# Patient Record
Sex: Female | Born: 1945
Health system: Southern US, Community
[De-identification: ages and names within clinical notes are randomized; demographics above are authoritative.]

## PROBLEM LIST (undated history)

## (undated) DIAGNOSIS — F419 Anxiety disorder, unspecified: Secondary | ICD-10-CM

## (undated) DIAGNOSIS — D649 Anemia, unspecified: Secondary | ICD-10-CM

## (undated) DIAGNOSIS — J189 Pneumonia, unspecified organism: Secondary | ICD-10-CM

## (undated) DIAGNOSIS — K635 Polyp of colon: Secondary | ICD-10-CM

## (undated) DIAGNOSIS — R0602 Shortness of breath: Secondary | ICD-10-CM

## (undated) DIAGNOSIS — I10 Essential (primary) hypertension: Secondary | ICD-10-CM

## (undated) DIAGNOSIS — K219 Gastro-esophageal reflux disease without esophagitis: Secondary | ICD-10-CM

## (undated) DIAGNOSIS — J4520 Mild intermittent asthma, uncomplicated: Secondary | ICD-10-CM

## (undated) DIAGNOSIS — K2100 Gastro-esophageal reflux disease with esophagitis, without bleeding: Secondary | ICD-10-CM

## (undated) DIAGNOSIS — U071 COVID-19: Secondary | ICD-10-CM

## (undated) DIAGNOSIS — E669 Obesity, unspecified: Secondary | ICD-10-CM

## (undated) DIAGNOSIS — F329 Major depressive disorder, single episode, unspecified: Secondary | ICD-10-CM

## (undated) DIAGNOSIS — J45909 Unspecified asthma, uncomplicated: Secondary | ICD-10-CM

## (undated) DIAGNOSIS — E119 Type 2 diabetes mellitus without complications: Secondary | ICD-10-CM

## (undated) DIAGNOSIS — K589 Irritable bowel syndrome without diarrhea: Secondary | ICD-10-CM

## (undated) DIAGNOSIS — M2550 Pain in unspecified joint: Secondary | ICD-10-CM

## (undated) DIAGNOSIS — R29898 Other symptoms and signs involving the musculoskeletal system: Secondary | ICD-10-CM

## (undated) DIAGNOSIS — M549 Dorsalgia, unspecified: Secondary | ICD-10-CM

## (undated) DIAGNOSIS — H538 Other visual disturbances: Secondary | ICD-10-CM

## (undated) DIAGNOSIS — G43909 Migraine, unspecified, not intractable, without status migrainosus: Secondary | ICD-10-CM

## (undated) DIAGNOSIS — R6889 Other general symptoms and signs: Secondary | ICD-10-CM

## (undated) DIAGNOSIS — N760 Acute vaginitis: Secondary | ICD-10-CM

## (undated) DIAGNOSIS — R51 Headache: Secondary | ICD-10-CM

## (undated) DIAGNOSIS — E1169 Type 2 diabetes mellitus with other specified complication: Secondary | ICD-10-CM

## (undated) DIAGNOSIS — E7800 Pure hypercholesterolemia, unspecified: Secondary | ICD-10-CM

## (undated) DIAGNOSIS — R5383 Other fatigue: Secondary | ICD-10-CM

## (undated) DIAGNOSIS — K8020 Calculus of gallbladder without cholecystitis without obstruction: Secondary | ICD-10-CM

## (undated) DIAGNOSIS — M797 Fibromyalgia: Secondary | ICD-10-CM

## (undated) DIAGNOSIS — F32A Depression, unspecified: Secondary | ICD-10-CM

## (undated) DIAGNOSIS — R42 Dizziness and giddiness: Secondary | ICD-10-CM

## (undated) DIAGNOSIS — M1990 Unspecified osteoarthritis, unspecified site: Secondary | ICD-10-CM

## (undated) DIAGNOSIS — R140 Abdominal distension (gaseous): Secondary | ICD-10-CM

## (undated) HISTORY — PX: CATARACT EXTRACTION: SUR2

## (undated) HISTORY — PX: DENTAL SURGERY: SHX609

## (undated) HISTORY — DX: Fibromyalgia: M79.7

## (undated) HISTORY — DX: Unspecified osteoarthritis, unspecified site: M19.90

## (undated) HISTORY — DX: Gastro-esophageal reflux disease without esophagitis: K21.9

## (undated) HISTORY — DX: Abdominal distension (gaseous): R14.0

## (undated) HISTORY — DX: Dorsalgia, unspecified: M54.9

## (undated) HISTORY — DX: Other visual disturbances: H53.8

## (undated) HISTORY — DX: Obesity, unspecified: E66.9

## (undated) HISTORY — DX: Anxiety disorder, unspecified: F41.9

## (undated) HISTORY — DX: Other general symptoms and signs: R68.89

## (undated) HISTORY — PX: CHOLECYSTECTOMY: SHX55

## (undated) HISTORY — DX: Depression, unspecified: F32.A

## (undated) HISTORY — DX: Dizziness and giddiness: R42

## (undated) HISTORY — DX: Major depressive disorder, single episode, unspecified: F32.9

## (undated) HISTORY — DX: Pure hypercholesterolemia, unspecified: E78.00

## (undated) HISTORY — PX: LAPAROSCOPIC ENDOMETRIOSIS FULGURATION: SUR769

## (undated) HISTORY — DX: Acute vaginitis: N76.0

## (undated) HISTORY — DX: Shortness of breath: R06.02

## (undated) HISTORY — PX: ABDOMINAL HYSTERECTOMY: SHX81

## (undated) HISTORY — DX: Other symptoms and signs involving the musculoskeletal system: R29.898

## (undated) HISTORY — DX: Other fatigue: R53.83

## (undated) HISTORY — DX: Essential (primary) hypertension: I10

## (undated) HISTORY — DX: Pneumonia, unspecified organism: J18.9

## (undated) HISTORY — DX: Unspecified asthma, uncomplicated: J45.909

## (undated) HISTORY — DX: Polyp of colon: K63.5

## (undated) HISTORY — DX: Calculus of gallbladder without cholecystitis without obstruction: K80.20

## (undated) HISTORY — DX: Headache: R51

## (undated) HISTORY — DX: Pain in unspecified joint: M25.50

## (undated) HISTORY — DX: Anemia, unspecified: D64.9

---
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
## 1997-03-11 HISTORY — PX: APPENDECTOMY: SHX54

## 1997-10-10 ENCOUNTER — Observation Stay (HOSPITAL_COMMUNITY): Admission: RE | Admit: 1997-10-10 | Discharge: 1997-10-11 | Payer: Self-pay | Admitting: *Deleted

## 1998-03-11 HISTORY — PX: HERNIA REPAIR: SHX51

## 1998-08-02 ENCOUNTER — Other Ambulatory Visit: Admission: RE | Admit: 1998-08-02 | Discharge: 1998-08-02 | Payer: Self-pay | Admitting: Gastroenterology

## 1999-03-08 ENCOUNTER — Encounter: Payer: Self-pay | Admitting: Obstetrics and Gynecology

## 1999-03-08 ENCOUNTER — Encounter: Admission: RE | Admit: 1999-03-08 | Discharge: 1999-03-08 | Payer: Self-pay | Admitting: Obstetrics and Gynecology

## 2000-12-03 ENCOUNTER — Encounter: Admission: RE | Admit: 2000-12-03 | Discharge: 2000-12-03 | Payer: Self-pay | Admitting: Obstetrics and Gynecology

## 2000-12-03 ENCOUNTER — Encounter: Payer: Self-pay | Admitting: Obstetrics and Gynecology

## 2001-12-09 ENCOUNTER — Encounter: Admission: RE | Admit: 2001-12-09 | Discharge: 2001-12-09 | Payer: Self-pay | Admitting: Obstetrics and Gynecology

## 2001-12-09 ENCOUNTER — Encounter: Payer: Self-pay | Admitting: Obstetrics and Gynecology

## 2002-02-09 ENCOUNTER — Encounter: Payer: Self-pay | Admitting: Gastroenterology

## 2002-02-09 ENCOUNTER — Ambulatory Visit (HOSPITAL_COMMUNITY): Admission: RE | Admit: 2002-02-09 | Discharge: 2002-02-09 | Payer: Self-pay | Admitting: Gastroenterology

## 2002-07-15 ENCOUNTER — Encounter: Payer: Self-pay | Admitting: *Deleted

## 2002-07-15 ENCOUNTER — Ambulatory Visit (HOSPITAL_COMMUNITY): Admission: RE | Admit: 2002-07-15 | Discharge: 2002-07-15 | Payer: Self-pay | Admitting: *Deleted

## 2002-12-13 ENCOUNTER — Encounter: Payer: Self-pay | Admitting: Obstetrics and Gynecology

## 2002-12-13 ENCOUNTER — Encounter: Admission: RE | Admit: 2002-12-13 | Discharge: 2002-12-13 | Payer: Self-pay | Admitting: Obstetrics and Gynecology

## 2003-12-20 ENCOUNTER — Ambulatory Visit (HOSPITAL_COMMUNITY): Admission: RE | Admit: 2003-12-20 | Discharge: 2003-12-20 | Payer: Self-pay | Admitting: Obstetrics and Gynecology

## 2004-12-31 ENCOUNTER — Ambulatory Visit (HOSPITAL_COMMUNITY): Admission: RE | Admit: 2004-12-31 | Discharge: 2004-12-31 | Payer: Self-pay | Admitting: Obstetrics and Gynecology

## 2005-01-22 ENCOUNTER — Encounter: Admission: RE | Admit: 2005-01-22 | Discharge: 2005-04-08 | Payer: Self-pay | Admitting: Orthopaedic Surgery

## 2006-01-02 ENCOUNTER — Ambulatory Visit (HOSPITAL_COMMUNITY): Admission: RE | Admit: 2006-01-02 | Discharge: 2006-01-02 | Payer: Self-pay | Admitting: Obstetrics and Gynecology

## 2006-02-18 ENCOUNTER — Ambulatory Visit: Payer: Self-pay | Admitting: Gastroenterology

## 2006-03-06 ENCOUNTER — Encounter (INDEPENDENT_AMBULATORY_CARE_PROVIDER_SITE_OTHER): Payer: Self-pay | Admitting: Specialist

## 2006-03-06 ENCOUNTER — Ambulatory Visit: Payer: Self-pay | Admitting: Gastroenterology

## 2006-04-03 ENCOUNTER — Ambulatory Visit (HOSPITAL_COMMUNITY): Admission: RE | Admit: 2006-04-03 | Discharge: 2006-04-03 | Payer: Self-pay | Admitting: Gastroenterology

## 2007-02-12 ENCOUNTER — Ambulatory Visit (HOSPITAL_COMMUNITY): Admission: RE | Admit: 2007-02-12 | Discharge: 2007-02-12 | Payer: Self-pay | Admitting: Obstetrics and Gynecology

## 2007-12-27 IMAGING — MG MM DIGITAL SCREENING BILAT
4 series · 4 of 4 positions shown · non-contrast
Comparison: Prior studies.

DG SCREEN MAMMOGRAM BILATERAL
Bilateral CC and MLO view(s) were taken.

DIGITAL SCREENING MAMMOGRAM WITH CAD:

[R CC]
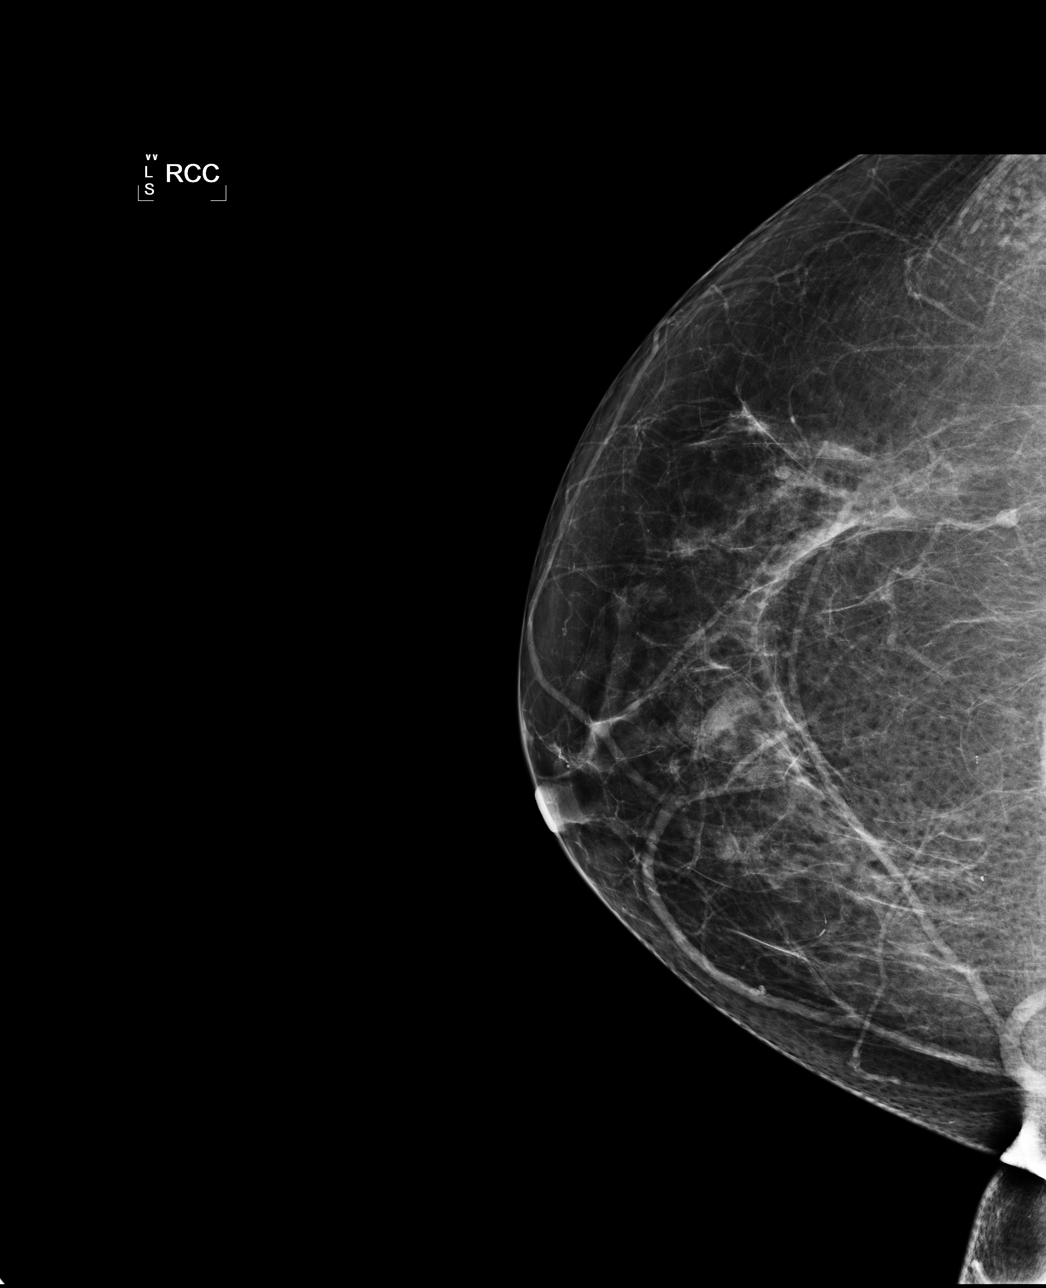

[R MLO]
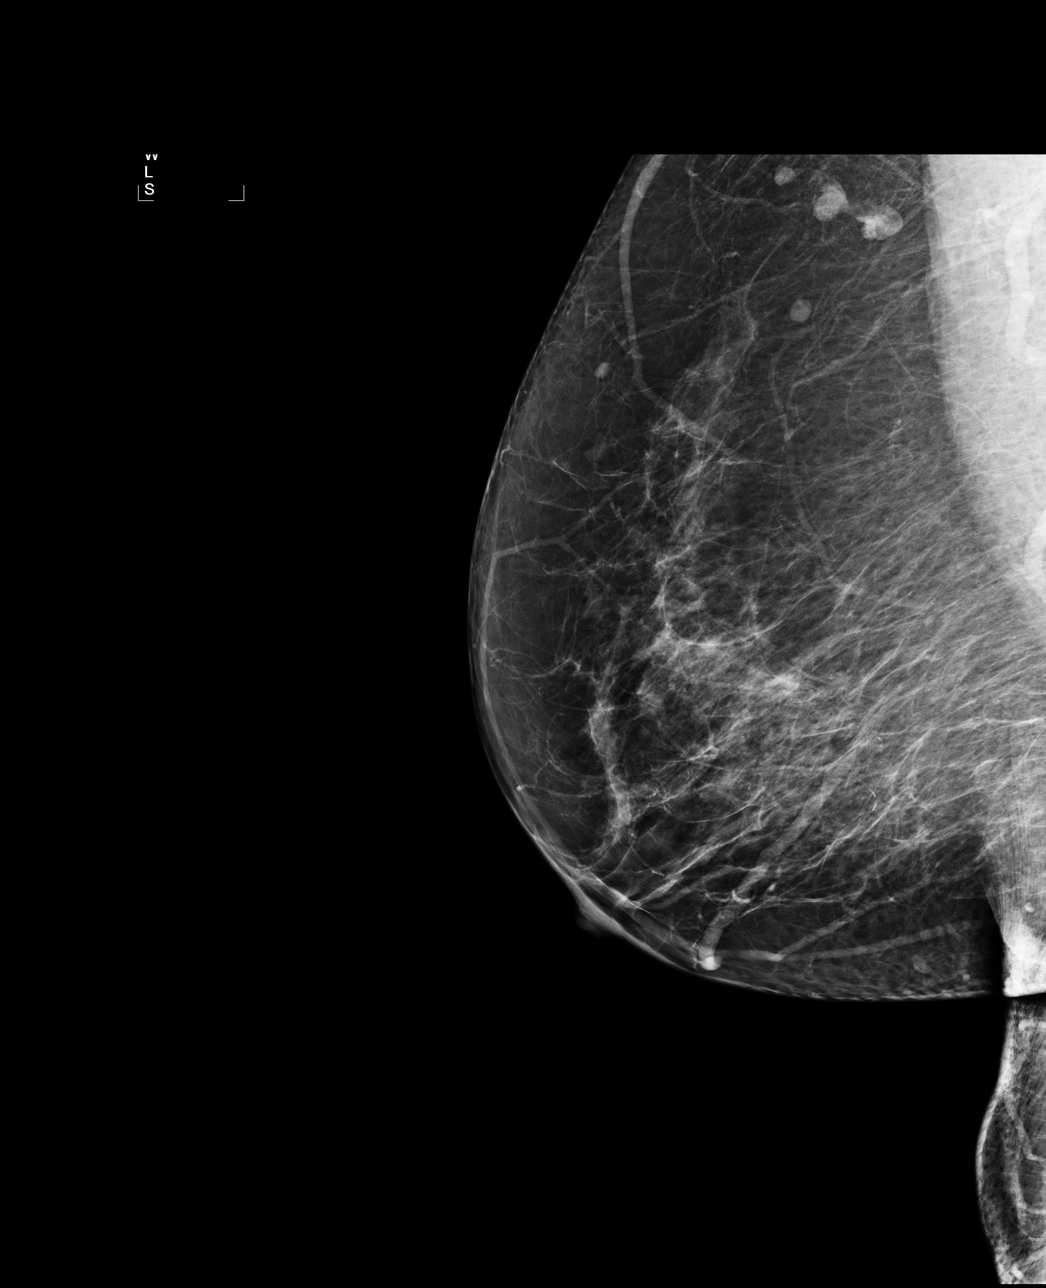

[L CC]
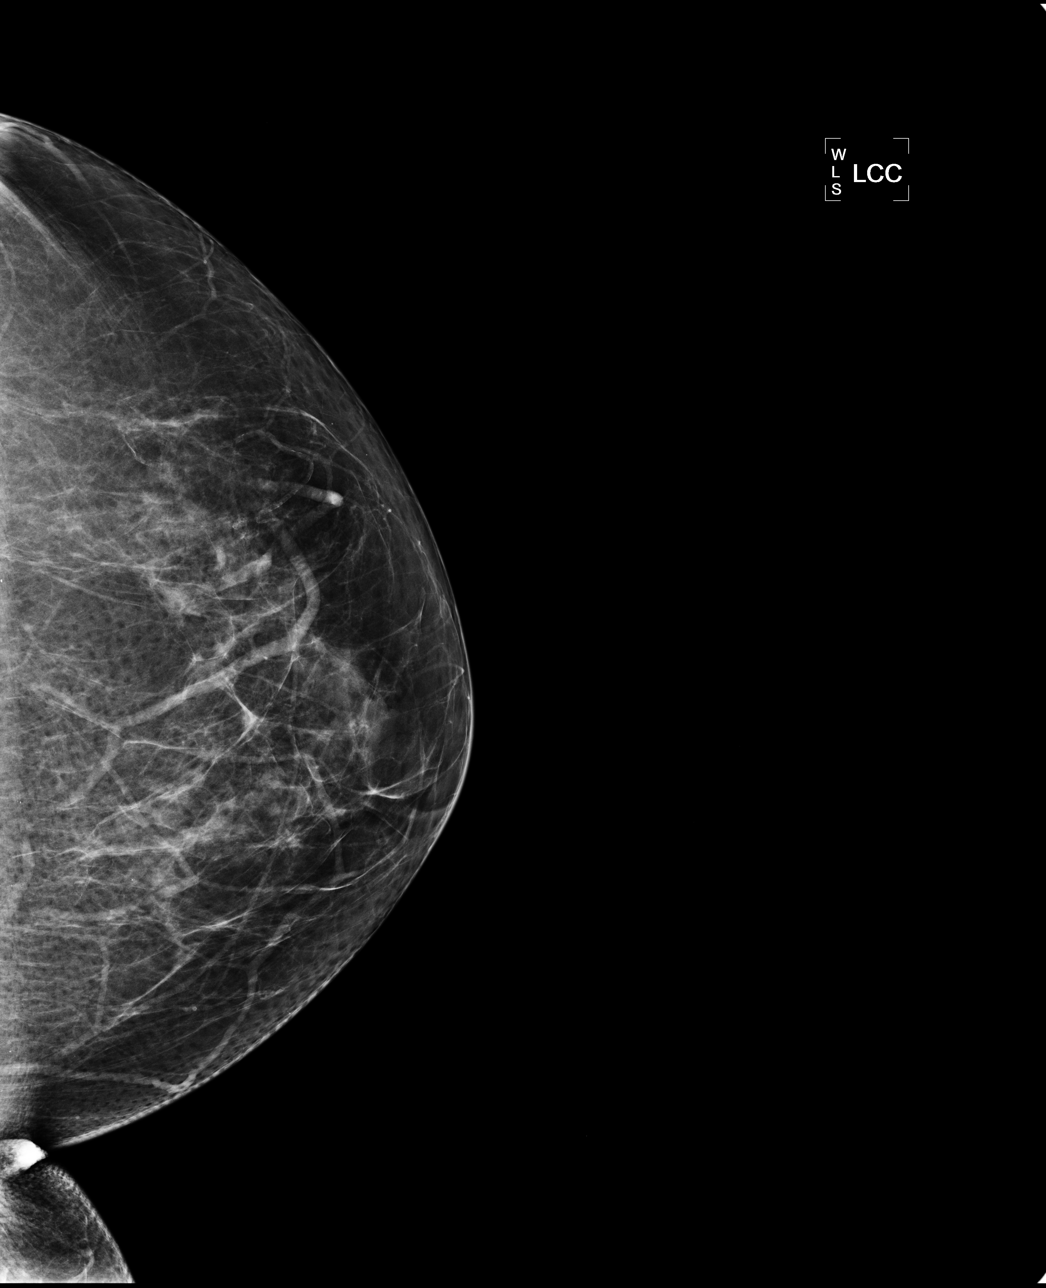

[L MLO]
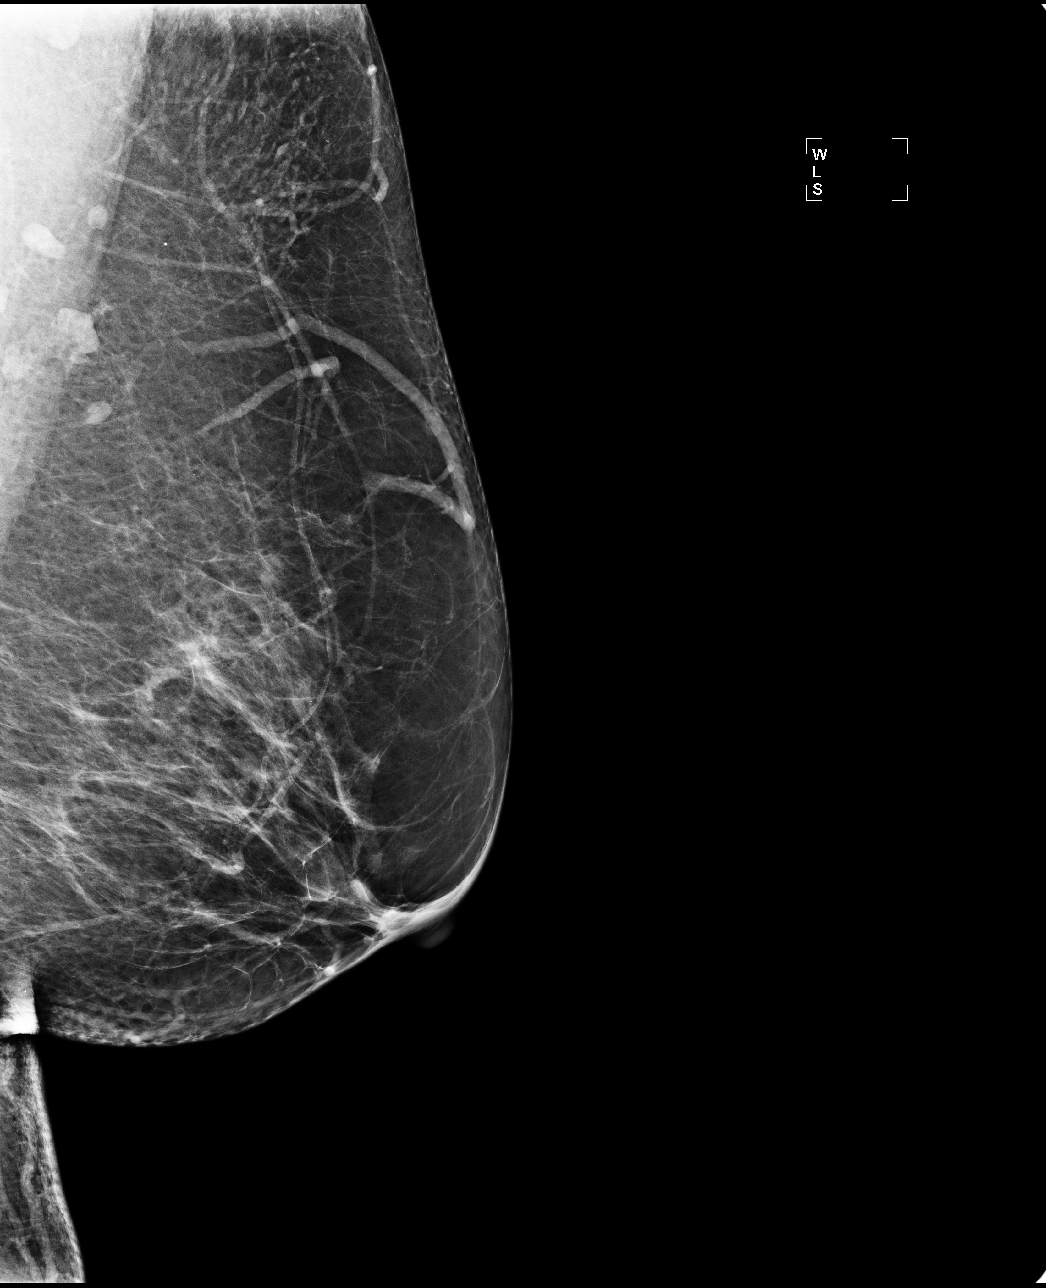

[4 of 4 positions shown; findings below may reference images not displayed]

There are scattered fibroglandular densities.  There is no dominant mass, architectural distortion 
or calcification to suggest malignancy.
IMPRESSION: No mammographic evidence of malignancy.  Suggest yearly screening mammography.

ASSESSMENT: Negative - BI-RADS 1

Screening mammogram in 1 year.
ANALYZED BY COMPUTER AIDED DETECTION. , THIS PROCEDURE WAS A DIGITAL MAMMOGRAM.

## 2009-04-18 LAB — HM PAP SMEAR: HM Pap smear: NORMAL

## 2009-07-17 ENCOUNTER — Encounter: Admission: RE | Admit: 2009-07-17 | Discharge: 2009-07-17 | Payer: Self-pay | Admitting: Orthopedic Surgery

## 2010-04-01 ENCOUNTER — Encounter: Payer: Self-pay | Admitting: Obstetrics and Gynecology

## 2010-04-01 ENCOUNTER — Encounter: Payer: Self-pay | Admitting: Family Medicine

## 2010-07-27 NOTE — Assessment & Plan Note (Signed)
[facility]                         GASTROENTEROLOGY OFFICE NOTE   Nicole, [name]                      MRN:          [id number]  DATE:02/19/2006                            DOB:          1945/05/13    REASON FOR REFERRAL:  Heartburn, nausea and Hemoccult positive stool.   HISTORY OF PRESENT ILLNESS:  [name] is a 65 year old white female  whom I have evaluated in the past. She has a history of GERD and  adenomatous colon polyps that were initially diagnosed in December of  1996. Her last colonoscopy was performed in November of 2003, which was  normal. The examination was only complete to the ascending colon due to  a tortuous colon. A subsequent barium enema was performed that showed no  evidence of polyps or mass lesions. There was marked tortuosity of the  colon particularly in the sigmoid colon and in the region of the hepatic  flexure. The cecum was felt to be mobile. There were areas of spasm in  the right colon and during the procedure, palpation of her right abdomen  led to spasms in the right colon, which was correlated with recurrent  episodes of pain. She notes her reflux symptoms are only fairly  controlled and she has frequent episodes of nausea and reflux despite  remaining on Prevacid 30 mg on a daily basis. She notes no dysphagia or  odynophagia. She does have persistent problems with intermittent cramp-  like right lower quadrant pain. She was recently found to have Hemoccult  positive stool on an exam by Dr. Romeo Apple. She is now referred for  further evaluation.   PAST MEDICAL HISTORY:  1. Asthma.  2. Hyperlipidemia.  3. Arthritis.  4. Anxiety.  5. Depression.  6. Rhinitis.  7. Headaches.  8. Gastroesophageal reflux disease.  9. Adenomatous colon polyps.  10.Status post cholecystectomy.  11.Status post inguinal herniorrhaphy.  12.Status post tubal ligation.  13.Status post hysterectomy.   CURRENT MEDICATIONS:   As listed on the chart; updated and reviewed.   MEDICATION ALLERGIES:  None known.   SOCIAL HISTORY AND REVIEW OF SYSTEMS:  Per the handwritten form.   PHYSICAL EXAMINATION:  In no acute distress. Height 5 feet 1-1/2 inches.  Weight 184 pounds. Blood pressure is 118/82, pulse 88 and regular.  HEENT: Anicteric sclerae. Oropharynx clear.  CHEST: Clear to auscultation bilaterally.  CARDIAC: Regular rate and rhythm without murmurs appreciated.  ABDOMEN: Is soft with mild right lower quadrant tenderness to deep  palpation. No rebound or guarding. No palpable organomegaly, masses or  hernias. Normoactive bowel sounds.  RECTAL: Deferred to time of colonoscopy.  EXTREMITIES: Without clubbing, cyanosis or edema.  NEUROLOGIC: Alert and oriented x3. Grossly nonfocal.   ASSESSMENT AND PLAN:  Hemoccult positive stool and fairly controlled  reflux symptoms with intermittent right lower quadrant pain. Trial of  Levsin 1 to 2 SL QID prn right lower quadrant pain. Re-intensify all  anti-reflux measures. Risks, benefits and alternatives to colonoscopy  with possible biopsy and possible polypectomy and upper endoscopy with  possible biopsy discussed with the patient. She consents to proceed. The  procedures will be scheduled electively.     Venita Lick. Russella Dar, MD, [facility]  Electronically Signed    MTS/MedQ  DD: 02/20/2006  DT: 02/20/2006  Job #: [id number]   cc:   Holley Bouche, M.D.

## 2010-09-19 ENCOUNTER — Encounter: Payer: Self-pay | Admitting: Family Medicine

## 2011-03-22 ENCOUNTER — Encounter: Payer: Self-pay | Admitting: Gastroenterology

## 2011-05-07 ENCOUNTER — Encounter: Payer: Self-pay | Admitting: Gynecology

## 2011-05-07 ENCOUNTER — Ambulatory Visit (INDEPENDENT_AMBULATORY_CARE_PROVIDER_SITE_OTHER): Payer: Medicare Other | Admitting: Gynecology

## 2011-05-07 ENCOUNTER — Other Ambulatory Visit (HOSPITAL_COMMUNITY)
Admission: RE | Admit: 2011-05-07 | Discharge: 2011-05-07 | Disposition: A | Discharge status: Home / Self Care | Payer: Medicare Other | Source: Ambulatory Visit | Attending: Gynecology | Admitting: Gynecology

## 2011-05-07 VITALS — BP 138/80 | Ht 61.75 in | Wt 196.0 lb

## 2011-05-07 DIAGNOSIS — Z01419 Encounter for gynecological examination (general) (routine) without abnormal findings: Secondary | ICD-10-CM | POA: Insufficient documentation

## 2011-05-07 DIAGNOSIS — N809 Endometriosis, unspecified: Secondary | ICD-10-CM | POA: Insufficient documentation

## 2011-05-07 DIAGNOSIS — R32 Unspecified urinary incontinence: Secondary | ICD-10-CM | POA: Insufficient documentation

## 2011-05-07 DIAGNOSIS — F329 Major depressive disorder, single episode, unspecified: Secondary | ICD-10-CM | POA: Insufficient documentation

## 2011-05-07 DIAGNOSIS — F419 Anxiety disorder, unspecified: Secondary | ICD-10-CM | POA: Insufficient documentation

## 2011-05-07 DIAGNOSIS — Z780 Asymptomatic menopausal state: Secondary | ICD-10-CM

## 2011-05-07 DIAGNOSIS — F32A Depression, unspecified: Secondary | ICD-10-CM | POA: Insufficient documentation

## 2011-05-07 DIAGNOSIS — J45909 Unspecified asthma, uncomplicated: Secondary | ICD-10-CM | POA: Insufficient documentation

## 2011-05-07 DIAGNOSIS — N952 Postmenopausal atrophic vaginitis: Secondary | ICD-10-CM

## 2011-05-07 DIAGNOSIS — Z1272 Encounter for screening for malignant neoplasm of vagina: Secondary | ICD-10-CM

## 2011-05-07 DIAGNOSIS — D649 Anemia, unspecified: Secondary | ICD-10-CM | POA: Insufficient documentation

## 2011-05-07 DIAGNOSIS — M797 Fibromyalgia: Secondary | ICD-10-CM | POA: Insufficient documentation

## 2011-05-07 DIAGNOSIS — E1169 Type 2 diabetes mellitus with other specified complication: Secondary | ICD-10-CM | POA: Insufficient documentation

## 2011-05-07 NOTE — Progress Notes (Signed)
TASHAI CATINO May 01, 1945 [id number]        66 y.o.  New patient for annual exam.  Former patient of Dr. Levy Sjogren. Several issues noted below  Past medical history,surgical history, medications, allergies, family history and social history were all reviewed and documented in the EPIC chart. ROS:  Was performed and pertinent positives and negatives are included in the history.  Exam: Amy chaperone present Filed Vitals:   05/07/11 1036  BP: 138/80   General appearance  Normal Skin grossly normal Head/Neck normal with no cervical or supraclavicular adenopathy thyroid normal Lungs  clear Cardiac RR, without RMG Abdominal  soft, nontender, without masses, organomegaly or hernia Breasts  examined lying and sitting without masses, retractions, discharge or axillary adenopathy. Pelvic  Ext/BUS/vagina  normal with mild atrophic changes.  Pap of cuff done   Adnexa  Without masses or tenderness    Anus and perineum  normal   Rectovaginal  normal sphincter tone without palpated masses or tenderness.    Assessment/Plan:  66 y.o. female for annual exam.    1. 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 Had been on oral estrogen replacement that has been discontinued and doing well without significant hot flushes night sweats.  Will continue to monitor. 2. Pap smear. Patient does have a history of atypical glandular cells on Pap smear in 96. She ultimately had a cone biopsy but I cannot find these pathology results in the records she brought with her. Her hysterectomy was after this and the pathology did not show any cervical or endocervical abnormalities. I did a Pap smear of her cuff today. 3. Mammography. Patient is overdue for her mammogram and knows to schedule this. SBE monthly reviewed. 4. Colonoscopy. Patient has one scheduled in March. 5. Bone density. It's been a number of years since she's had a bone density and I went  ahead and ordered one today. Increase calcium vitamin D was reviewed with her. 6. Urinary incontinence. Patient notes urge type urinary incontinence. I reviewed options with her to include medications such as Vesicare or Enablex. The side effect profile was also reviewed. She's already having issues with constipation she prefer not to take medication but just to monitor her symptoms. A urinalysis was ordered today. If it becomes a significant issue I may refer to urology. 7. Health maintenance. Patient does have a number of issues and she has her blood work checked through Dr. Rinaldo Cloud office. No blood work was done today. Assuming she continues well from a gynecologic standpoint she will see me in a year sooner as needed.    Dara Lords MD, 12:12 PM 05/07/2011

## 2011-05-07 NOTE — Patient Instructions (Signed)
Follow up in one year for your annual gynecologic exam. 

## 2011-05-08 LAB — URINALYSIS W MICROSCOPIC + REFLEX CULTURE
Casts: NONE SEEN
Crystals: NONE SEEN
Leukocytes, UA: NEGATIVE
Nitrite: NEGATIVE
Specific Gravity, Urine: 1.007 (ref 1.005–1.030)
pH: 7 (ref 5.0–8.0)

## 2011-05-14 ENCOUNTER — Ambulatory Visit (INDEPENDENT_AMBULATORY_CARE_PROVIDER_SITE_OTHER): Payer: Medicare Other

## 2011-05-14 DIAGNOSIS — Z780 Asymptomatic menopausal state: Secondary | ICD-10-CM

## 2011-05-14 DIAGNOSIS — Z13820 Encounter for screening for osteoporosis: Secondary | ICD-10-CM

## 2011-05-21 ENCOUNTER — Encounter: Payer: Self-pay | Admitting: *Deleted

## 2011-06-17 ENCOUNTER — Ambulatory Visit (AMBULATORY_SURGERY_CENTER): Payer: Medicare Other | Admitting: *Deleted

## 2011-06-17 ENCOUNTER — Encounter: Payer: Self-pay | Admitting: Gastroenterology

## 2011-06-17 VITALS — Ht 62.0 in | Wt 195.3 lb

## 2011-06-17 DIAGNOSIS — Z1211 Encounter for screening for malignant neoplasm of colon: Secondary | ICD-10-CM

## 2011-06-17 MED ORDER — PEG-KCL-NACL-NASULF-NA ASC-C 100 G PO SOLR: ORAL | Status: DC

## 2011-07-01 ENCOUNTER — Other Ambulatory Visit: Payer: Self-pay | Admitting: Gastroenterology

## 2011-07-31 ENCOUNTER — Ambulatory Visit (AMBULATORY_SURGERY_CENTER): Payer: Medicare Other | Admitting: Gastroenterology

## 2011-07-31 ENCOUNTER — Encounter: Payer: Self-pay | Admitting: Gastroenterology

## 2011-07-31 VITALS — Temp 98.4°F | Ht 62.0 in | Wt 195.0 lb

## 2011-07-31 DIAGNOSIS — D126 Benign neoplasm of colon, unspecified: Secondary | ICD-10-CM

## 2011-07-31 DIAGNOSIS — Z1211 Encounter for screening for malignant neoplasm of colon: Secondary | ICD-10-CM

## 2011-07-31 DIAGNOSIS — Z86010 Personal history of colonic polyps: Secondary | ICD-10-CM

## 2011-07-31 LAB — GLUCOSE, CAPILLARY: Glucose-Capillary: 111 mg/dL — ABNORMAL HIGH (ref 70–99)

## 2011-07-31 MED ORDER — SODIUM CHLORIDE 0.9 % IV SOLN: 500.0000 mL | INTRAVENOUS | Status: DC

## 2011-07-31 NOTE — Op Note (Signed)
[facility] [address] [place], [place]  [address]  COLONOSCOPY PROCEDURE REPORT PATIENT:  Nicole, [name]  MR#:  [id number] BIRTHDATE:  26-Nov-1945, 65 yrs. old  GENDER:  female ENDOSCOPIST:  Judie Petit T. Russella Dar, MD, [facility]  PROCEDURE DATE:  07/31/2011 PROCEDURE:  Colonoscopy with biopsy and snare polypectomy ASA CLASS:  Class II INDICATIONS:  1) surveillance and high-risk screening  2) history of pre-cancerous (adenomatous) colon polyps: 1996, 2007 MEDICATIONS:   MAC sedation, administered by CRNA, propofol (Diprivan) 450 mg IV DESCRIPTION OF PROCEDURE:   After the risks benefits and alternatives of the procedure were thoroughly explained, informed consent was obtained.  Digital rectal exam was performed and revealed no abnormalities.   The LB CF-[id number] endoscope was introduced through the anus and advanced to the ascending colon, limited by a tortuous and redundant colon.  The quality of the prep was good, using MoviPrep.  The instrument was then slowly withdrawn as the colon was fully examined. <<PROCEDUREIMAGES>> FINDINGS:  Two polyps were found in the ascending colon. They were 4 - 6 mm in size. With hot biopsy forceps, biopsy was obtained and sent to pathology. Due to scope looping and tortuous colon I could not approach the polyps with a snare.  Two polyps were found at the hepatic flexure. They were 4 - 10 mm in size. Polyps were snared, then cauterized with monopolar cautery. Retrieval was successful. Two polyps were found in the descending colon. They were 4 - 5 mm in size.  Otherwise normal colonoscopy without other polyps, masses, vascular ectasias, or inflammatory changes. Retroflexed views in the rectum revealed internal hemorrhoids, small.  The time to cecum =  8.25  minutes. The scope was then withdrawn (time =  13.75  min) from the patient and the procedure completed.  COMPLICATIONS:  None  ENDOSCOPIC IMPRESSION: 1) 4 - 6 mm Two polyps in the  ascending colon 2) 4 - 10 mm Two polyps at the hepatic flexure 3) 4 - 5 mm Two polyps in the descending colon 4) Internal hemorrhoids  RECOMMENDATIONS: 1) Hold aspirin, aspirin products, and anti-inflammatory medication for 2 weeks. 2) Await pathology results 3) Repeat Colonoscopy in 3 years at a tertiary center.  Venita Lick. Russella Dar, MD, [facility]  CC:  Adrian Prince, MD  n. Rosalie DoctorVenita Lick. [name] at 07/31/2011 09:14 AM  Herbie Saxon, [phone]

## 2011-07-31 NOTE — Patient Instructions (Signed)

## 2011-07-31 NOTE — Progress Notes (Signed)
Patient did not experience any of the following events: a burn prior to discharge; a fall within the facility; wrong site/side/patient/procedure/implant event; or a hospital transfer or hospital admission upon discharge from the facility. (G8907) Patient did not have preoperative order for IV antibiotic SSI prophylaxis. (G8918)  

## 2011-08-01 ENCOUNTER — Telehealth: Payer: Self-pay | Admitting: *Deleted

## 2011-08-01 ENCOUNTER — Ambulatory Visit (INDEPENDENT_AMBULATORY_CARE_PROVIDER_SITE_OTHER): Payer: Medicare Other | Admitting: Physician Assistant

## 2011-08-01 ENCOUNTER — Encounter: Payer: Self-pay | Admitting: Physician Assistant

## 2011-08-01 VITALS — BP 120/64 | HR 72 | Ht 62.0 in | Wt 191.6 lb

## 2011-08-01 DIAGNOSIS — R1031 Right lower quadrant pain: Secondary | ICD-10-CM

## 2011-08-01 DIAGNOSIS — Z9889 Other specified postprocedural states: Secondary | ICD-10-CM

## 2011-08-01 MED ORDER — CIPROFLOXACIN HCL 500 MG PO TABS: 500.0000 mg | ORAL_TABLET | Freq: Two times a day (BID) | ORAL | Status: AC

## 2011-08-01 NOTE — Telephone Encounter (Signed)
  Follow up Call-  Call back number 07/31/2011  Post procedure Call Back phone  # [phone]  Permission to leave phone message Yes     Patient questions:  Do you have a fever, pain , or abdominal swelling? yes Pain Score  5 *  Have you tolerated food without any problems? yes  Have you been able to return to your normal activities? no  Do you have any questions about your discharge instructions: Diet   no Medications  no Follow up visit  no  Do you have questions or concerns about your Care? no  Actions: * If pain score is 4 or above: Physician/ provider Notified : Claudette Head, MD. Pt. C/o  Pain to right side describe it as dull and when she moves it becomes sharp and some times burns and stings,also stated that her neck is sore,both sides and chin. Denies swelling of abdomen and the pain does not radiate any where. She is using a  Heating pad and have her feet elevated.when I explained to her that I will notify you to make you aware,I informed her that she may need to go to the hospital to be checked out she stated she did not want to go any where. Please advise.

## 2011-08-01 NOTE — Patient Instructions (Addendum)
Take Percocet as needed for pain. We sent a prescription for Cipro to [facility] [address] Rest at home.  Follow up appointment with Dr. Russella Dar is scheduled for 08-14-2011 at 2:45 PM.   Appointment card given.

## 2011-08-01 NOTE — Telephone Encounter (Signed)
I recommend that she see Mike Gip, PA in our office this morning to evaluate her. If she comes in this morning I can see her with Amy.

## 2011-08-01 NOTE — Progress Notes (Addendum)
Subjective:    Patient ID: Nicole [name], female    DOB: 03-26-1945, 66 y.o.   MRN: [id number]  HPI Nicole [name] is a 66 year old white female, known to Dr. Russella Dar who underwent colonoscopy yesterday for followup of adenomatous colon polyps. At colonoscopy she was found to have 2 polyps in the descending colon 4-6 mm in size removed with hot biopsy forceps he She was noted to have a very tortuous colon polyps were unable to be snared. She also had 2 polyps at the hepatic flexure 4-10 mm in size which were snared and then cauterized. She had 2 polyps in the descending colon as well Patient tolerated the procedure well but says about 2 after 2 hours after she returned home last evening she developed right lower quadrant pain which has been persistent since. She has Percocet at home for other uses and took some Percocet which did help. This morning she says her pain was at about the same level 5/10,she has not had a bowel movement nor any bleeding, she has been passing gas. She has not had any associated nausea or vomiting and has been eating. She's had no fever chills or sweats. She called because she has had persistent pain. She has taken Percocet today and says the pain is currently about a 3/10.   Review of Systems  Constitutional: Negative.   HENT: Negative.   Eyes: Negative.   Respiratory: Negative.   Cardiovascular: Negative.   Gastrointestinal: Positive for abdominal pain.  Genitourinary: Negative.   Musculoskeletal: Negative.   Neurological: Negative.   Hematological: Negative.   Psychiatric/Behavioral: Negative.    Outpatient Prescriptions Prior to Visit  Medication Sig Dispense Refill  . albuterol (PROVENTIL) (2.5 MG/3ML) 0.083% nebulizer solution Take 2.5 mg by nebulization every 6 (six) hours as needed.        Marland Kitchen azelastine (ASTELIN) 137 MCG/SPRAY nasal spray Place 1 spray into the nose 2 (two) times daily. Use in each nostril as directed       . ergocalciferol (VITAMIN D2) 50000 UNITS  capsule Take 50,000 Units by mouth once a week.        . fenofibrate 160 MG tablet Take 160 mg by mouth daily.      . Fluticasone-Salmeterol (ADVAIR) 250-50 MCG/DOSE AEPB Inhale 1 puff into the lungs every 12 (twelve) hours.      Marland Kitchen lamoTRIgine (LAMICTAL) 25 MG tablet Take 25 mg by mouth 2 (two) times daily.       Marland Kitchen LIDODERM 5 %       . linagliptin (TRADJENTA) 5 MG TABS tablet Take 5 mg by mouth daily.      Marland Kitchen LORazepam (ATIVAN) 1 MG tablet Take 1.5 mg by mouth every 8 (eight) hours.        . meloxicam (MOBIC) 15 MG tablet Take 15 mg by mouth daily.      Marland Kitchen oxyCODONE-acetaminophen (PERCOCET) 10-325 MG per tablet Take 1 tablet by mouth as needed.      . pantoprazole (PROTONIX) 40 MG tablet Take 40 mg by mouth daily.        . sertraline (ZOLOFT) 50 MG tablet Take 50 mg by mouth daily.       . simvastatin (ZOCOR) 40 MG tablet Take 40 mg by mouth at bedtime.        Marland Kitchen aspirin 81 MG tablet Take 81 mg by mouth daily.        . traMADol (ULTRAM) 50 MG tablet Take 50 mg by mouth every 6 (six) hours as  needed.         Allergies  Allergen Reactions  . Shellfish Allergy Shortness Of Breath    In dye for scan.   Patient Active Problem List  Diagnoses  . Anemia  . Anxiety  . Depression  . Diabetes mellitus  . Urinary incontinence  . Endometriosis  . Asthma  . Fibromyalgia   History   Social History  . Marital Status: Widowed    Spouse Name: N/A    Number of Children: N/A  . Years of Education: N/A   Occupational History  . Not on file.   Social History Main Topics  . Smoking status: Never Smoker   . Smokeless tobacco: Never Used  . Alcohol Use: Yes     occasional  . Drug Use: No  . Sexually Active: Not Currently   Other Topics Concern  . Not on file   Social History Narrative  . No narrative on file       Objective:   Physical Exam well-developed white female in no acute distress, pleasant blood pressure 120/64 pulse 72 height 5 foot 2 weight 191. HEENT; nontraumatic  normocephalic EOMI PERRLA sclera anicteric, Supple no JVD, Cardiovascular; regular rate and rhythm with S1-S2 no murmur or gallop, Pulmonary; clear bilaterally, Abdomen; a large soft, she is a large cholecystectomy scar, she is tender in the right lower quadrant there is no guarding and no rebound no palpable mass or hepatosplenomegaly bowel sounds are present, Rectal; not done, Extremities; no clubbing, cyanosis or edema skin warm dry, Psych; mood and affect normal and appropriate.        Assessment & Plan:  #3 66 year old female 24-hour status post colonoscopy and polypectomy with 6 polyps removed now presenting with right lower quadrant pain. Her exam is consistent with a post-polypectomy thermal injury. She does not have any evidence of peritoneal inflammation. #2 adenomatous colon polyps Plan; post polypectomy thermal injury discussed with the patient in detail. Patient encouraged to continue Percocet as needed over the next 24-48 hours, explained that the general course is gradual improvement over 3-4 days. Start Cipro 500 mg by mouth twice daily x5 days. Small light meals until pain improve. Patient encouraged to rest at home over the next 24 hours. She is also asked to call at any point should her symptoms worsen and that she would need a CT scan at that time. Outpatient followup with Dr. Russella Dar in 2 weeks.  Addendum: Reviewed and agree with initial management Beverley Fiedler, MD

## 2011-08-01 NOTE — Telephone Encounter (Signed)
Call placed back to pt. To inform her that see is to come in today on the third floor to see Amy Esterwood for follow up of pain at 3:00 P.M.

## 2011-08-06 ENCOUNTER — Encounter: Payer: Self-pay | Admitting: Gastroenterology

## 2011-08-14 ENCOUNTER — Encounter: Payer: Self-pay | Admitting: Gastroenterology

## 2011-08-14 ENCOUNTER — Ambulatory Visit (INDEPENDENT_AMBULATORY_CARE_PROVIDER_SITE_OTHER): Payer: Medicare Other | Admitting: Gastroenterology

## 2011-08-14 ENCOUNTER — Ambulatory Visit: Payer: Medicare Other | Admitting: Gastroenterology

## 2011-08-14 VITALS — BP 112/60 | HR 88 | Ht 62.0 in | Wt 194.2 lb

## 2011-08-14 DIAGNOSIS — K5900 Constipation, unspecified: Secondary | ICD-10-CM

## 2011-08-14 DIAGNOSIS — Z86010 Personal history of colonic polyps: Secondary | ICD-10-CM

## 2011-08-14 DIAGNOSIS — K219 Gastro-esophageal reflux disease without esophagitis: Secondary | ICD-10-CM

## 2011-08-14 MED ORDER — PANTOPRAZOLE SODIUM 40 MG PO TBEC: 40.0000 mg | DELAYED_RELEASE_TABLET | Freq: Two times a day (BID) | ORAL | Status: DC

## 2011-08-14 MED ORDER — POLYETHYLENE GLYCOL 3350 17 GM/SCOOP PO POWD: 17.0000 g | Freq: Every day | ORAL | Status: AC

## 2011-08-14 NOTE — Patient Instructions (Addendum)
Take Miralax 1-2 times a day  Take Protonix 40 mg twice a day.  Follow up with Dr. Russella Dar in 1 year or as needed.   cc: Adrian Prince, MD

## 2011-08-14 NOTE — Progress Notes (Signed)
History of Present Illness: This is a 66 year old female who recently underwent colonoscopy with polypectomy complicated by post-polypectomy syndrome with right lower abdominal pain. She was treated with a course of Cipro. Her right lower quadrant pain has resolved. She has ongoing problems with constipation and lower abdominal discomfort. She states her reflux symptoms are frequently active in the evenings.  Current Medications, Allergies, Past Medical History, Past Surgical History, Family History and Social History were reviewed in [employer] record.  Physical Exam: General: Well developed , well nourished, no acute distress Head: Normocephalic and atraumatic Eyes:  sclerae anicteric, EOMI Ears: Normal auditory acuity Mouth: No deformity or lesions Lungs: Clear throughout to auscultation Heart: Regular rate and rhythm; no murmurs, rubs or bruits Abdomen: Soft, non tender and non distended. No masses, hepatosplenomegaly or hernias noted. Normal Bowel sounds Musculoskeletal: Symmetrical with no gross deformities  Pulses:  Normal pulses noted Extremities: No clubbing, cyanosis, edema or deformities noted Neurological: Alert oriented x 4, grossly nonfocal Psychological:  Alert and cooperative. Normal mood and affect  Assessment and Recommendations:  1. Post polypectomy syndrome-resolved.   2. Constipation is the likely cause of her lower bowel discomfort. Begin MiraLax once or twice daily long-term.  3. GERD. Symptoms not adequate control. Increase pantoprazole to 40 mg twice a day. Intensify antireflux measures.  4. Personal history of adenomatous colon polyps and incomplete colonoscopies. Surveillance colonoscopy in 3 years. I recommended subsequent colonoscopies at a [facility] and she is agreeable with this plan.

## 2012-06-10 ENCOUNTER — Encounter: Payer: Self-pay | Admitting: Cardiology

## 2012-11-24 ENCOUNTER — Encounter: Payer: Self-pay | Admitting: Gynecology

## 2013-10-05 ENCOUNTER — Ambulatory Visit: Payer: Medicare Other | Admitting: Cardiology

## 2013-11-05 ENCOUNTER — Ambulatory Visit (INDEPENDENT_AMBULATORY_CARE_PROVIDER_SITE_OTHER): Payer: Medicare Other | Admitting: Cardiology

## 2013-11-05 ENCOUNTER — Encounter: Payer: Self-pay | Admitting: Cardiology

## 2013-11-05 VITALS — BP 130/80 | HR 76 | Ht 62.0 in | Wt 183.6 lb

## 2013-11-05 DIAGNOSIS — E119 Type 2 diabetes mellitus without complications: Secondary | ICD-10-CM

## 2013-11-05 DIAGNOSIS — E1169 Type 2 diabetes mellitus with other specified complication: Secondary | ICD-10-CM

## 2013-11-05 DIAGNOSIS — R0789 Other chest pain: Secondary | ICD-10-CM | POA: Insufficient documentation

## 2013-11-05 DIAGNOSIS — E669 Obesity, unspecified: Secondary | ICD-10-CM

## 2013-11-05 DIAGNOSIS — E785 Hyperlipidemia, unspecified: Secondary | ICD-10-CM | POA: Insufficient documentation

## 2013-11-05 NOTE — Patient Instructions (Signed)
We will schedule you for a nuclear stress test in one month.

## 2013-11-05 NOTE — Progress Notes (Signed)
Nicole [name] Date of Birth: 1945-11-10 Medical Record #[id number]  History of Present Illness: Nicole [name] is seen at the request of Dr. Forde Dandy for cardiac evaluation. She is a pleasant 68 yo WF with history of DM type 2, HL, obesity, and strong family history of CAD. She also has a history of fibromyalgia. She complains of left precordial chest pain sometimes radiating to back. She also has some posterior neck pain radiating to left arm. No SOB or diaphoresis. She is sedentary. She underwent a stress Myoview study in August 2010. She was able to walk 5 minutes and 38 seconds without chest pain. myoview showed mild breast attenuation but was otherwise normal.     Medication List       This list is accurate as of: 11/05/13  3:27 PM.  Always use your most recent med list.               azelastine 0.1 % nasal spray  Commonly known as:  ASTELIN  Place 1 spray into the nose 2 (two) times daily. Use in each nostril as directed     BESIVANCE 0.6 % Susp  Generic drug:  Besifloxacin HCl  Place 1 drop into the left eye 3 (three) times daily.     DUREZOL 0.05 % Emul  Generic drug:  Difluprednate  Place 1 drop into the left eye 3 (three) times daily.     ergocalciferol 50000 UNITS capsule  Commonly known as:  VITAMIN D2  Take 50,000 Units by mouth once a week.     fenofibrate 160 MG tablet  Take 160 mg by mouth daily.     Fluticasone-Salmeterol 250-50 MCG/DOSE Aepb  Commonly known as:  ADVAIR  Inhale 1 puff into the lungs every 12 (twelve) hours.     ILEVRO 0.3 % Susp  Generic drug:  Nepafenac  Place 1 drop into the right eye daily.     lamoTRIgine 25 MG tablet  Commonly known as:  LAMICTAL  Take 25 mg by mouth 2 (two) times daily.     LIDODERM 5 %  Generic drug:  lidocaine     linagliptin 5 MG Tabs tablet  Commonly known as:  TRADJENTA  Take 5 mg by mouth daily.     LORazepam 1 MG tablet  Commonly known as:  ATIVAN  Take 1.5 mg by mouth every 8 (eight) hours.     meloxicam 15 MG tablet  Commonly known as:  MOBIC  Take 15 mg by mouth daily.     oxyCODONE-acetaminophen 10-325 MG per tablet  Commonly known as:  PERCOCET  Take 1 tablet by mouth as needed.     pantoprazole 40 MG tablet  Commonly known as:  PROTONIX  Take 1 tablet (40 mg total) by mouth 2 (two) times daily.     sertraline 50 MG tablet  Commonly known as:  ZOLOFT  Take 50 mg by mouth daily.     simvastatin 40 MG tablet  Commonly known as:  ZOCOR  Take 40 mg by mouth at bedtime.         Allergies  Allergen Reactions  . Shellfish Allergy Shortness Of Breath    In dye for scan.    Past Medical History  Diagnosis Date  . Vaginitis   . Anemia   . Anxiety   . Depression   . Diabetes mellitus     prediabetic  . Urinary incontinence   . Endometriosis     history of  . Asthma   .  Fibromyalgia   . GERD (gastroesophageal reflux disease)   . Hypercholesterolemia   . Obesity   . SOB (shortness of breath)   . Chest pain   . Dizziness   . Blurred vision     occasional  . Leg weakness   . Joint pain   . Back pain   . Fatigue   . Bloating   . Forgetfulness   . Colon polyps     Past Surgical History  Procedure Laterality Date  . Abdominal hysterectomy    . Laparoscopic endometriosis fulguration    . Hernia repair  2000  . Appendectomy  1999  . Cholecystectomy    . Dental surgery      History   Social History  . Marital Status: Widowed    Spouse Name: N/A    Number of Children: 1  . Years of Education: N/A   Occupational History  . disability    Social History Main Topics  . Smoking status: Never Smoker   . Smokeless tobacco: Never Used  . Alcohol Use: Yes     Comment: occasional  . Drug Use: No  . Sexual Activity: Not Currently   Other Topics Concern  . None   Social History Narrative  . None    Family History  Problem Relation Age of Onset  . Hypertension Mother   . Heart failure Mother   . Diabetes Mother   . Hypertension Father     . Heart failure Father   . Diabetes Father   . Heart attack Father   . Stroke Father   . Cancer Brother     lung  . Thyroid disease Brother   . Cancer Paternal Aunt     bone  . Cancer Paternal Uncle     lung  . Hypertension Maternal Grandmother   . Hypertension Maternal Grandfather   . Hypertension Paternal Grandmother   . Hypertension Paternal Grandfather   . Cancer Brother     throat  . Diabetes Daughter   . Colon cancer Neg Hx   . Stomach cancer Neg Hx     Review of Systems: As noted in HPI.  All other systems were reviewed and are negative.  Physical Exam: BP 130/80  Pulse 76  Ht 5\' 2"  (1.575 m)  Wt 183 lb 9.6 oz (83.28 kg)  BMI 33.57 kg/m2 Filed Weights   11/05/13 1441  Weight: 183 lb 9.6 oz (83.28 kg)  GENERAL:  Well appearing, morbidly obese WF in NAD HEENT:  PERRL, EOMI, sclera are clear. Oropharynx is clear. NECK:  No jugular venous distention, carotid upstroke brisk and symmetric, no bruits, no thyromegaly or adenopathy LUNGS:  Clear to auscultation bilaterally CHEST:  Unremarkable HEART:  RRR,  PMI not displaced or sustained,S1 and S2 within normal limits, no S3, no S4: no clicks, no rubs, no murmurs ABD:  Soft, nontender. BS +, no masses or bruits. No hepatomegaly, no splenomegaly EXT:  2 + pulses throughout, no edema, no cyanosis no clubbing SKIN:  Warm and dry.  No rashes NEURO:  Alert and oriented x 3. Cranial nerves II through XII intact. PSYCH:  Cognitively intact    LABORATORY DATA: Ecg: NSR rate 76 bpm. Nonspecific ST-T wave abnormality.  Assessment / Plan: 1. Atypical chest pain. Patient has multiple cardiac risk factors. Normal Myoview in 2010. I would recommend updating stress Myoview at this time since she is at intermediate risk for CAD.  2. DM type 2  3. Hyperlipidemia.  4. Morbid obesity  5. Fibromyalgia.

## 2013-12-14 ENCOUNTER — Encounter: Payer: Self-pay | Admitting: Gastroenterology

## 2013-12-24 ENCOUNTER — Telehealth (HOSPITAL_COMMUNITY): Payer: Self-pay

## 2013-12-24 NOTE — Telephone Encounter (Signed)
Encounter complete. 

## 2013-12-29 ENCOUNTER — Encounter (HOSPITAL_COMMUNITY): Payer: Medicare Other

## 2014-01-10 ENCOUNTER — Encounter: Payer: Self-pay | Admitting: Cardiology

## 2014-01-13 ENCOUNTER — Telehealth (HOSPITAL_COMMUNITY): Payer: Self-pay

## 2014-01-13 NOTE — Telephone Encounter (Signed)
Encounter complete. 

## 2014-01-14 ENCOUNTER — Telehealth (HOSPITAL_COMMUNITY): Payer: Self-pay

## 2014-01-14 NOTE — Telephone Encounter (Signed)
Encounter complete. 

## 2014-01-18 ENCOUNTER — Ambulatory Visit (HOSPITAL_COMMUNITY)
Admission: RE | Admit: 2014-01-18 | Discharge: 2014-01-18 | Disposition: A | Discharge status: Home / Self Care | Payer: Medicare Other | Source: Ambulatory Visit | Attending: Cardiovascular Disease | Admitting: Cardiovascular Disease

## 2014-01-18 DIAGNOSIS — I10 Essential (primary) hypertension: Secondary | ICD-10-CM | POA: Insufficient documentation

## 2014-01-18 DIAGNOSIS — Z8249 Family history of ischemic heart disease and other diseases of the circulatory system: Secondary | ICD-10-CM | POA: Insufficient documentation

## 2014-01-18 DIAGNOSIS — E669 Obesity, unspecified: Secondary | ICD-10-CM | POA: Insufficient documentation

## 2014-01-18 DIAGNOSIS — R5383 Other fatigue: Secondary | ICD-10-CM | POA: Insufficient documentation

## 2014-01-18 DIAGNOSIS — R079 Chest pain, unspecified: Secondary | ICD-10-CM | POA: Diagnosis present

## 2014-01-18 DIAGNOSIS — R0609 Other forms of dyspnea: Secondary | ICD-10-CM | POA: Diagnosis not present

## 2014-01-18 DIAGNOSIS — R0789 Other chest pain: Secondary | ICD-10-CM

## 2014-01-18 DIAGNOSIS — E119 Type 2 diabetes mellitus without complications: Secondary | ICD-10-CM | POA: Insufficient documentation

## 2014-01-18 DIAGNOSIS — R42 Dizziness and giddiness: Secondary | ICD-10-CM | POA: Insufficient documentation

## 2014-01-18 DIAGNOSIS — E785 Hyperlipidemia, unspecified: Secondary | ICD-10-CM

## 2014-01-18 DIAGNOSIS — E1169 Type 2 diabetes mellitus with other specified complication: Secondary | ICD-10-CM

## 2014-01-18 MED ORDER — TECHNETIUM TC 99M SESTAMIBI GENERIC - CARDIOLITE
10.6000 | Freq: Once | INTRAVENOUS | Status: AC | PRN
  Administered 2014-01-18: 11 via INTRAVENOUS

## 2014-01-18 MED ORDER — TECHNETIUM TC 99M SESTAMIBI GENERIC - CARDIOLITE
31.0000 | Freq: Once | INTRAVENOUS | Status: AC | PRN
  Administered 2014-01-18: 31 via INTRAVENOUS

## 2014-01-18 NOTE — Procedures (Addendum)
[facility] [address] [place] [address] [phone]  Cardiology Nuclear Med Study  Nicole [name] is a 68 y.o. female     MRN : [id number]     DOB: 1945-11-10  Procedure Date: 01/18/2014  Nuclear Med Background Indication for Stress Test:  Evaluation for Ischemia and Abnormal EKG History:  Asthma and No cardiac history reported;Last NUC MPI on 10/31/2008-nonischemic;EF=70% Cardiac Risk Factors: Family History - CAD, Lipids, NIDDM and Obesity  Symptoms:  Chest Pain, Dizziness, DOE, Fatigue, Light-Headedness and SOB   Nuclear Pre-Procedure Caffeine/Decaff Intake:  12:00am NPO After: 10am   IV Site: R Forearm  IV 0.9% NS with Angio Cath:  22g  Chest Size (in):  n/a IV Started by: Rolene Course, RN  Height: 5\' 2"  (1.575 m)  Cup Size: A  BMI:  Body mass index is 33.46 kg/(m^2). Weight:  183 lb (83.008 kg)   Tech Comments:  n/a    Nuclear Med Study 1 or 2 day study: 1 day  Stress Test Type:  Stress  Order Authorizing Provider:  Peter Martinique, MD   Resting Radionuclide: Technetium 68m Sestamibi  Resting Radionuclide Dose: 10.6 mCi   Stress Radionuclide:  Technetium 90m Sestamibi  Stress Radionuclide Dose: 31.0 mCi           Stress Protocol Rest HR: 81 Stress HR: 155  Rest BP: 152/96 Stress BP: 152/83  Exercise Time (min): 4:21 METS: 5   Predicted Max HR: 154 bpm % Max HR: 100.65 bpm Rate Pressure Product: 33015  Dose of Adenosine (mg):  n/a Dose of Lexiscan: n/a mg  Dose of Atropine (mg): n/a Dose of Dobutamine: n/a mcg/kg/min (at max HR)  Stress Test Technologist: Leane Para, CCT Nuclear Technologist: Imagene Riches, CNMT   Rest Procedure:  Myocardial perfusion imaging was performed at rest 45 minutes following the intravenous administration of Technetium 3m Sestamibi. Stress Procedure:  The patient performed treadmill exercise using a Bruce  Protocol for 4:21 minutes. The patient stopped due to Severe  SOB and denied any chest pain.  There were no significant ST-T wave changes.  Technetium 58m Sestamibi was injected IV at peak exercise and myocardial perfusion imaging was performed after a brief delay.  Transient Ischemic Dilatation (Normal <1.22):  0.98  QGS EDV:  65 ml QGS ESV:  29 ml LV Ejection Fraction: 56%       Rest ECG: NSR - Normal EKG  Stress ECG: No significant change from baseline ECG  QPS Raw Data Images:  Normal; no motion artifact; normal heart/lung ratio. Stress Images:  Normal homogeneous uptake in all areas of the myocardium. Rest Images:  Normal homogeneous uptake in all areas of the myocardium. Subtraction (SDS):  No evidence of ischemia.  Impression Exercise Capacity:  Fair exercise capacity. BP Response:  Normal blood pressure response. Clinical Symptoms:  No significant symptoms noted. ECG Impression:  No significant ST segment change suggestive of ischemia. Comparison with Prior Nuclear Study: No significant change from previous study  Overall Impression:  Normal stress nuclear study.  LV Wall Motion:  NL LV Function; NL Wall Motion   Lorretta Harp, MD  01/18/2014 4:18 PM

## 2014-02-23 ENCOUNTER — Ambulatory Visit (INDEPENDENT_AMBULATORY_CARE_PROVIDER_SITE_OTHER): Payer: Medicare Other | Admitting: Gynecology

## 2014-02-23 ENCOUNTER — Encounter: Payer: Self-pay | Admitting: Gynecology

## 2014-02-23 VITALS — BP 120/78 | Ht 62.0 in | Wt 182.0 lb

## 2014-02-23 DIAGNOSIS — N898 Other specified noninflammatory disorders of vagina: Secondary | ICD-10-CM

## 2014-02-23 DIAGNOSIS — N952 Postmenopausal atrophic vaginitis: Secondary | ICD-10-CM

## 2014-02-23 DIAGNOSIS — N9089 Other specified noninflammatory disorders of vulva and perineum: Secondary | ICD-10-CM

## 2014-02-23 LAB — WET PREP FOR TRICH, YEAST, CLUE: Trich, Wet Prep: NONE SEEN

## 2014-02-23 MED ORDER — METRONIDAZOLE 500 MG PO TABS: 500.0000 mg | ORAL_TABLET | Freq: Two times a day (BID) | ORAL | Status: DC

## 2014-02-23 MED ORDER — FLUCONAZOLE 200 MG PO TABS: 200.0000 mg | ORAL_TABLET | Freq: Every day | ORAL | Status: DC

## 2014-02-23 NOTE — Progress Notes (Signed)
Nicole [name] 05/30/1945 [id number]        68 y.o.  G1P1 for follow up exam. Several issues noted below.  Past medical history,surgical history, problem list, medications, allergies, family history and social history were all reviewed and documented as reviewed in the EPIC chart.  ROS:  Performed with pertinent positives and negatives included in the history, assessment and plan.   Additional significant findings :  Vulvar irritation for months   Exam: [name] assistant Filed Vitals:   02/23/14 1427  BP: 120/78  Height: 5\' 2"  (1.575 m)  Weight: 182 lb (82.555 kg)   General appearance:  Normal affect, orientation and appearance. Skin: Grossly normal HEENT: Without gross lesions.  No cervical or supraclavicular adenopathy. Thyroid normal.  Lungs:  Clear without wheezing, rales or rhonchi Cardiac: RR, without RMG Abdominal:  Soft, nontender, without masses, guarding, rebound, organomegaly or hernia Breasts:  Examined lying and sitting without masses, retractions, discharge or axillary adenopathy. Pelvic:  Ext/BUS/vagina generalized atrophic changes. Right labia majora hyperkeratotic, leathery feeling with darker pigmentation. No specific lesions ulcerations or inguinal adenopathy. Left labia normal  Adnexa  Without masses or tenderness    Anus and perineum  Normal   Rectovaginal  Normal sphincter tone without palpated masses or tenderness.    Assessment/Plan:  68 y.o. G1P1 female for follow up exam.   1. Right labial hyperkeratotic skin. Been going on for months. Very itchy to the patient. No specific lesions but generalized involvement of the entire labia. Does have a white discharge where KOH wet prep does show yeast and BV. This hyperkeratotic area does not involve the other labia or other skin surrounding the vagina. Recommend biopsy for better diagnoses and patient will schedule appointment. 2. Vaginal discharge. Wet prep positive for yeast and BV. We'll treat with Diflucan 200 mg  daily 3 days. Stop Zocor while taking. Flagyl 500 mg twice a day 7 days, alcohol avoidance reviewed. Follow up if symptoms persist, worsen or recur. 3. Postmenopausal/atrophic genital changes. Status post TAH in the past. Without significant hot flashes, night sweats or vaginal dryness. Continue to monitor. 4. Mammography overdue with last mammogram 11/2012. Patient reminded to schedule when she agrees to do so. SBE monthly reviewed. 5. DEXA 2013 normal. Recommend repeat at age 37, increased calcium vitamin D reviewed. 6. Colonoscopy 2013. Repeat at their recommended interval. 7. Health maintenance. No routine blood work done as this is done at her primary physician's office. Follow up for biopsy as scheduled.     Anastasio Auerbach MD, 3:06 PM 02/23/2014

## 2014-02-23 NOTE — Patient Instructions (Signed)
Take the Diflucan pill daily for 3 days. Stop your cholesterol medicine minor taking it. Take the Flagyl medication twice daily for 7 days. Avoid alcohol while taking. Follow up for the biopsy appointment as scheduled.  You may obtain a copy of any labs that were done today by logging onto MyChart as outlined in the instructions provided with your AVS (after visit summary). The office will not call with normal lab results but certainly if there are any significant abnormalities then we will contact you.   Health Maintenance, Female A healthy lifestyle and preventative care can promote health and wellness.  Maintain regular health, dental, and eye exams.  Eat a healthy diet. Foods like vegetables, fruits, whole grains, low-fat dairy products, and lean protein foods contain the nutrients you need without too many calories. Decrease your intake of foods high in solid fats, added sugars, and salt. Get information about a proper diet from your caregiver, if necessary.  Regular physical exercise is one of the most important things you can do for your health. Most adults should get at least 150 minutes of moderate-intensity exercise (any activity that increases your heart rate and causes you to sweat) each week. In addition, most adults need muscle-strengthening exercises on 2 or more days a week.   Maintain a healthy weight. The body mass index (BMI) is a screening tool to identify possible weight problems. It provides an estimate of body fat based on height and weight. Your caregiver can help determine your BMI, and can help you achieve or maintain a healthy weight. For adults 20 years and older:  A BMI below 18.5 is considered underweight.  A BMI of 18.5 to 24.9 is normal.  A BMI of 25 to 29.9 is considered overweight.  A BMI of 30 and above is considered obese.  Maintain normal blood lipids and cholesterol by exercising and minimizing your intake of saturated fat. Eat a balanced diet with  plenty of fruits and vegetables. Blood tests for lipids and cholesterol should begin at age 20 and be repeated every 5 years. If your lipid or cholesterol levels are high, you are over 50, or you are a high risk for heart disease, you may need your cholesterol levels checked more frequently.Ongoing high lipid and cholesterol levels should be treated with medicines if diet and exercise are not effective.  If you smoke, find out from your caregiver how to quit. If you do not use tobacco, do not start.  Lung cancer screening is recommended for adults aged 83 80 years who are at high risk for developing lung cancer because of a history of smoking. Yearly low-dose computed tomography (CT) is recommended for people who have at least a 30-pack-year history of smoking and are a current smoker or have quit within the past 15 years. A pack year of smoking is smoking an average of 1 pack of cigarettes a day for 1 year (for example: 1 pack a day for 30 years or 2 packs a day for 15 years). Yearly screening should continue until the smoker has stopped smoking for at least 15 years. Yearly screening should also be stopped for people who develop a health problem that would prevent them from having lung cancer treatment.  If you are pregnant, do not drink alcohol. If you are breastfeeding, be very cautious about drinking alcohol. If you are not pregnant and choose to drink alcohol, do not exceed 1 drink per day. One drink is considered to be 12 ounces (355 mL) of  beer, 5 ounces (148 mL) of wine, or 1.5 ounces (44 mL) of liquor.  Avoid use of street drugs. Do not share needles with anyone. Ask for help if you need support or instructions about stopping the use of drugs.  High blood pressure causes heart disease and increases the risk of stroke. Blood pressure should be checked at least every 1 to 2 years. Ongoing high blood pressure should be treated with medicines, if weight loss and exercise are not effective.  If you  are 25 to 66 years old, ask your caregiver if you should take aspirin to prevent strokes.  Diabetes screening involves taking a blood sample to check your fasting blood sugar level. This should be done once every 3 years, after age 68, if you are within normal weight and without risk factors for diabetes. Testing should be considered at a younger age or be carried out more frequently if you are overweight and have at least 1 risk factor for diabetes.  Breast cancer screening is essential preventative care for women. You should practice "breast self-awareness." This means understanding the normal appearance and feel of your breasts and may include breast self-examination. Any changes detected, no matter how small, should be reported to a caregiver. Women in their 60s and 30s should have a clinical breast exam (CBE) by a caregiver as part of a regular health exam every 1 to 3 years. After age 69, women should have a CBE every year. Starting at age 64, women should consider having a mammogram (breast X-ray) every year. Women who have a family history of breast cancer should talk to their caregiver about genetic screening. Women at a high risk of breast cancer should talk to their caregiver about having an MRI and a mammogram every year.  Breast cancer gene (BRCA)-related cancer risk assessment is recommended for women who have family members with BRCA-related cancers. BRCA-related cancers include breast, ovarian, tubal, and peritoneal cancers. Having family members with these cancers may be associated with an increased risk for harmful changes (mutations) in the breast cancer genes BRCA1 and BRCA2. Results of the assessment will determine the need for genetic counseling and BRCA1 and BRCA2 testing.  The Pap test is a screening test for cervical cancer. Women should have a Pap test starting at age 30. Between ages 23 and 66, Pap tests should be repeated every 2 years. Beginning at age 75, you should have a Pap  test every 3 years as long as the past 3 Pap tests have been normal. If you had a hysterectomy for a problem that was not cancer or a condition that could lead to cancer, then you no longer need Pap tests. If you are between ages 7 and 34, and you have had normal Pap tests going back 10 years, you no longer need Pap tests. If you have had past treatment for cervical cancer or a condition that could lead to cancer, you need Pap tests and screening for cancer for at least 20 years after your treatment. If Pap tests have been discontinued, risk factors (such as a new sexual partner) need to be reassessed to determine if screening should be resumed. Some women have medical problems that increase the chance of getting cervical cancer. In these cases, your caregiver may recommend more frequent screening and Pap tests.  The human papillomavirus (HPV) test is an additional test that may be used for cervical cancer screening. The HPV test looks for the virus that can cause the cell changes on  the cervix. The cells collected during the Pap test can be tested for HPV. The HPV test could be used to screen women aged 63 years and older, and should be used in women of any age who have unclear Pap test results. After the age of 22, women should have HPV testing at the same frequency as a Pap test.  Colorectal cancer can be detected and often prevented. Most routine colorectal cancer screening begins at the age of 85 and continues through age 65. However, your caregiver may recommend screening at an earlier age if you have risk factors for colon cancer. On a yearly basis, your caregiver may provide home test kits to check for hidden blood in the stool. Use of a small camera at the end of a tube, to directly examine the colon (sigmoidoscopy or colonoscopy), can detect the earliest forms of colorectal cancer. Talk to your caregiver about this at age 66, when routine screening begins. Direct examination of the colon should be  repeated every 5 to 10 years through age 76, unless early forms of pre-cancerous polyps or small growths are found.  Hepatitis C blood testing is recommended for all people born from 1 through 1965 and any individual with known risks for hepatitis C.  Practice safe sex. Use condoms and avoid high-risk sexual practices to reduce the spread of sexually transmitted infections (STIs). Sexually active women aged 58 and younger should be checked for Chlamydia, which is a common sexually transmitted infection. Older women with new or multiple partners should also be tested for Chlamydia. Testing for other STIs is recommended if you are sexually active and at increased risk.  Osteoporosis is a disease in which the bones lose minerals and strength with aging. This can result in serious bone fractures. The risk of osteoporosis can be identified using a bone density scan. Women ages 46 and over and women at risk for fractures or osteoporosis should discuss screening with their caregivers. Ask your caregiver whether you should be taking a calcium supplement or vitamin D to reduce the rate of osteoporosis.  Menopause can be associated with physical symptoms and risks. Hormone replacement therapy is available to decrease symptoms and risks. You should talk to your caregiver about whether hormone replacement therapy is right for you.  Use sunscreen. Apply sunscreen liberally and repeatedly throughout the day. You should seek shade when your shadow is shorter than you. Protect yourself by wearing long sleeves, pants, a wide-brimmed hat, and sunglasses year round, whenever you are outdoors.  Notify your caregiver of new moles or changes in moles, especially if there is a change in shape or color. Also notify your caregiver if a mole is larger than the size of a pencil eraser.  Stay current with your immunizations. Document Released: 09/10/2010 Document Revised: 06/22/2012 Document Reviewed: 09/10/2010 [facility]  Patient Information 2014 [place].

## 2014-02-23 NOTE — Addendum Note (Signed)
Addended by: Nelva Nay on: 02/23/2014 03:21 PM   Modules accepted: Orders

## 2014-02-24 LAB — URINALYSIS W MICROSCOPIC + REFLEX CULTURE
Bacteria, UA: NONE SEEN
Bilirubin Urine: NEGATIVE
CASTS: NONE SEEN
CRYSTALS: NONE SEEN
GLUCOSE, UA: NEGATIVE mg/dL
HGB URINE DIPSTICK: NEGATIVE
Ketones, ur: NEGATIVE mg/dL
NITRITE: NEGATIVE
PH: 6 (ref 5.0–8.0)
Protein, ur: NEGATIVE mg/dL
SPECIFIC GRAVITY, URINE: 1.01 (ref 1.005–1.030)
Urobilinogen, UA: 0.2 mg/dL (ref 0.0–1.0)

## 2014-02-25 LAB — URINE CULTURE
COLONY COUNT: NO GROWTH
Organism ID, Bacteria: NO GROWTH

## 2014-03-21 ENCOUNTER — Encounter: Payer: Self-pay | Admitting: Gynecology

## 2014-03-21 ENCOUNTER — Ambulatory Visit (INDEPENDENT_AMBULATORY_CARE_PROVIDER_SITE_OTHER): Payer: Medicare Other | Admitting: Gynecology

## 2014-03-21 DIAGNOSIS — N9089 Other specified noninflammatory disorders of vulva and perineum: Secondary | ICD-10-CM

## 2014-03-21 NOTE — Progress Notes (Signed)
Nicole [name] 1945/07/03 [id number]        69 y.o.  G1P1 presents for vulvar biopsy.  Recently seen annually where she was complaining of vulvar pruritus for the last several months. She was treated for yeast vaginitis and bacterial vaginosis. Her vulvar exam showed a hyperkeratotic darker pigmented leathery feeling right labia majora and she was recommended for biopsy before initiating treatment.  Past medical history,surgical history, problem list, medications, allergies, family history and social history were all reviewed and documented in the EPIC chart.  Directed ROS with pertinent positives and negatives documented in the history of present illness/assessment and plan.  Exam: [name] assistant General appearance:  Normal External BUS vagina with generalized hyperkeratotic leathery feeling darker pigmented right labia majora. No specific lesions. No vaginal abnormalities or regional adenopathy.   The mid right labia majora was cleansed with Hibiclens, infiltrated with 1% Xylocaine and a representative skin biopsy was taken. Silver nitrate hemostasis applied afterwards.  Assessment/Plan:  69 y.o. G1P1 with right labia majora as described above. Representative biopsy taken. Patient will follow up for results and treatment plan.     Anastasio Auerbach MD, 3:38 PM 03/21/2014

## 2014-03-21 NOTE — Patient Instructions (Signed)
Office will call you with biopsy results within several days.

## 2014-03-28 ENCOUNTER — Other Ambulatory Visit: Payer: Self-pay | Admitting: Gynecology

## 2014-03-28 MED ORDER — CLOBETASOL PROP EMOLLIENT BASE 0.05 % EX CREA: TOPICAL_CREAM | CUTANEOUS | Status: DC

## 2014-04-26 ENCOUNTER — Ambulatory Visit: Payer: Medicare Other | Admitting: Gynecology

## 2014-05-10 ENCOUNTER — Ambulatory Visit (INDEPENDENT_AMBULATORY_CARE_PROVIDER_SITE_OTHER): Payer: Medicare Other | Admitting: Gynecology

## 2014-05-10 ENCOUNTER — Encounter: Payer: Self-pay | Admitting: Gynecology

## 2014-05-10 VITALS — BP 130/74

## 2014-05-10 DIAGNOSIS — N762 Acute vulvitis: Secondary | ICD-10-CM | POA: Diagnosis not present

## 2014-05-10 NOTE — Progress Notes (Signed)
Nicole [name] 1946-02-07 [id number]        68 y.o.  G1P1 Presents in follow up of her hyperkeratotic, leathery feeling darker pigmented right labia majora. 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 No atypia. Was started on Temovate 0.05% cream mid January and presents now in follow up. She notes that she is no longer having any irritation or symptoms. That the leathery feeling has resolved.  Past medical history,surgical history, problem list, medications, allergies, family history and social history were all reviewed and documented in the EPIC chart.  Directed ROS with pertinent positives and negatives documented in the history of present illness/assessment and plan.  Exam: [name] assistant General appearance:  Normal External BUS vagina with atrophic changes. Right labia majora is completely normal in appearance. There is no longer the hyperkeratotic appearance or hyperpigmentation.  Bimanual without masses or tenderness.  Assessment/Plan:  69 y.o. G1P1 with above history and exam. Prior changes have resolved using Temovate. Patient will wean off of the Temovate now. If she has recurrence of irritation she'll use short bursts of Temovate to eradicate the symptoms. Follow up if symptoms do not resolve with treatment. Otherwise follow up December 2016 when she is due for her annual exam.     Anastasio Auerbach MD, 1:56 PM 05/10/2014

## 2014-05-10 NOTE — Patient Instructions (Signed)
Wean off of the steroid cream. Use it intermittently if you develop irritation. Follow up if the irritation continues despite using the cream intermittently.

## 2014-05-24 DIAGNOSIS — F3160 Bipolar disorder, current episode mixed, unspecified: Secondary | ICD-10-CM | POA: Insufficient documentation

## 2014-06-30 ENCOUNTER — Ambulatory Visit: Payer: Self-pay | Admitting: Neurology

## 2014-08-31 ENCOUNTER — Ambulatory Visit (INDEPENDENT_AMBULATORY_CARE_PROVIDER_SITE_OTHER): Payer: Medicare Other | Admitting: Neurology

## 2014-08-31 ENCOUNTER — Encounter: Payer: Self-pay | Admitting: Neurology

## 2014-08-31 VITALS — BP 122/70 | HR 83 | Resp 16 | Ht 61.0 in | Wt 178.0 lb

## 2014-08-31 DIAGNOSIS — R51 Headache: Secondary | ICD-10-CM | POA: Diagnosis not present

## 2014-08-31 DIAGNOSIS — F419 Anxiety disorder, unspecified: Secondary | ICD-10-CM | POA: Diagnosis not present

## 2014-08-31 DIAGNOSIS — R413 Other amnesia: Secondary | ICD-10-CM | POA: Insufficient documentation

## 2014-08-31 DIAGNOSIS — R519 Headache, unspecified: Secondary | ICD-10-CM

## 2014-08-31 MED ORDER — GABAPENTIN 300 MG PO CAPS: ORAL_CAPSULE | ORAL | Status: DC

## 2014-08-31 NOTE — Progress Notes (Signed)
NEUROLOGY CONSULTATION NOTE  Nicole [name] MRN: [id number] DOB: [birth year]  Referring provider: Dr. [name] Primary care provider: Dr. [name]  Reason for consult:  Memory loss, headaches  Dear Dr Forde Dandy:  Thank you for your kind referral of Nicole [name] for consultation of the above symptoms. Although her history is well known to you, please allow me to reiterate it for the purpose of our medical record. Records and images were personally reviewed where available.  HISTORY OF PRESENT ILLNESS: This is a 69 year old right-handed woman with a history of anxiety, fibromyalgia, chronic back pain, and migraines, presenting for memory loss and chronic daily headaches. She is unsure if the memory changes are due to the headaches. She reports that headaches started in her 10s, she has "always had headaches and just lived with them," occurring once every 1-2 weeks. Over the past few years, headaches have increased in frequency now occurring daily, at times waking her up in the middle of the night. She has a constant bad around her head that she describes as "stress headaches." In addition, she has more severe migraines occurring 2-3 times a week, lasting 2-3 days, with sharp pain on the left temple, going down her left eye where she feels her left eye droops down, followed by stiffness on the left side of her neck. These would be associated with nausea, photo and phonophobia where she needs to go to bed and needs 2-3 days to recover. She denies any tearing but has noticed her left eye would get red. She also has sinus headaches, but can feel the difference from her other headaches. No clear triggers to the headaches. She had previously seen neurologist Dr. Erling Cruz and had several tests, diagnosed with "unexplained headaches." She recalls taking Triavil (combination amitriptyline and perphenazine) which "took them away totally" however this was taken off the market. She has been on Zoloft for  many years. She has occasional lightheadedness with the headaches, as well as panic attacks, and would take prn ativan. Her grandmother had severe headaches, a brother had cluster headaches. She does not recall trying any triptans. She has been taking Percocet for the headaches, and reports that she does not take more than 1 or 2 a day.  She has noticed memory changes over the past year where she would get confused. She would call her daughter and ask her what the date is. When she pays her bills, she "goes into a fog" and pushes them back "like they are not there." She knows there are bills to pay but "just does not have the energy" to do it. She denies getting lost driving or leaving the stove on. She lives alone and denies any difficulties with ADLs. She almost never forgets to take her medications, using a pillbox. She has slight word-finding difficulties. She loses her bifocals all the time. She is sleeping a lot, and would wake up 4 hours later wondering where the time went. Her grandmother had memory issues, a daughter also has memory changes that she attributes to fibromyalgia and multiple medications. She denies any vision changes, no diplopia, blurred vision, dysarthria, dysphagia, bowel/bladder dysfunction, tremors. She occasionally has anosmia, occasional neck and chronic back pain. She occasionally drinks alcohol but this makes her feel very hot. No head injuries.    PAST MEDICAL HISTORY: Past Medical History  Diagnosis Date  . Vaginitis   . Anemia   . Anxiety   . Depression   . Diabetes mellitus  prediabetic  . Urinary incontinence   . Endometriosis     history of  . Asthma   . Fibromyalgia   . GERD (gastroesophageal reflux disease)   . Hypercholesterolemia   . Obesity   . SOB (shortness of breath)   . Chest pain   . Dizziness   . Blurred vision     occasional  . Leg weakness   . Joint pain   . Back pain   . Fatigue   . Bloating   . Forgetfulness   . Colon polyps      PAST SURGICAL HISTORY: Past Surgical History  Procedure Laterality Date  . Abdominal hysterectomy    . Laparoscopic endometriosis fulguration    . Hernia repair  2000  . Appendectomy  1999  . Cholecystectomy    . Dental surgery    . Cataract extraction      both    MEDICATIONS: Current Outpatient Prescriptions on File Prior to Visit  Medication Sig Dispense Refill  . Clobetasol Prop Emollient Base 0.05 % emollient cream Apply nightly to affected area. 30 g 0  . ergocalciferol (VITAMIN D2) 50000 UNITS capsule Take 50,000 Units by mouth once a week.      . fenofibrate 160 MG tablet Take 160 mg by mouth daily.    Marland Kitchen LORazepam (ATIVAN) 1 MG tablet 3 (three) times daily as needed.     . meloxicam (MOBIC) 15 MG tablet Take 15 mg by mouth daily.    Marland Kitchen oxyCODONE-acetaminophen (PERCOCET) 10-325 MG per tablet Take 1 tablet by mouth 3 (three) times daily as needed.     . pantoprazole (PROTONIX) 40 MG tablet Take 1 tablet (40 mg total) by mouth 2 (two) times daily. 60 tablet 6  . sertraline (ZOLOFT) 50 MG tablet Take 50 mg by mouth daily. Takes with a 25 mg tablet    . simvastatin (ZOCOR) 40 MG tablet Take 40 mg by mouth at bedtime.       No current facility-administered medications on file prior to visit.    ALLERGIES: Allergies  Allergen Reactions  . Fish-Derived Products Anaphylaxis  . Shellfish Allergy Shortness Of Breath    In dye for scan.  . Aspirin Nausea Only    FAMILY HISTORY: Family History  Problem Relation Age of Onset  . Hypertension Mother   . Heart failure Mother   . Diabetes Mother   . Hypertension Father   . Heart failure Father   . Diabetes Father   . Heart attack Father   . Stroke Father   . Cancer Brother     lung  . Thyroid disease Brother   . Cancer Paternal Aunt     bone  . Cancer Paternal Uncle     lung  . Hypertension Maternal Grandmother   . Hypertension Maternal Grandfather   . Hypertension Paternal Grandmother   . Hypertension Paternal  Grandfather   . Cancer Brother     throat  . Diabetes Daughter   . Colon cancer Neg Hx   . Stomach cancer Neg Hx     SOCIAL HISTORY: History   Social History  . Marital Status: Widowed    Spouse Name: N/A  . Number of Children: 1  . Years of Education: N/A   Occupational History  . disability    Social History Main Topics  . Smoking status: Never Smoker   . Smokeless tobacco: Never Used  . Alcohol Use: 0.0 oz/week    0 Standard drinks or equivalent per week  Comment: occasional  . Drug Use: No  . Sexual Activity: Not Currently   Other Topics Concern  . Not on file   Social History Narrative    REVIEW OF SYSTEMS: Constitutional: No fevers, chills, or sweats, no generalized fatigue, change in appetite Eyes: No visual changes, double vision, eye pain Ear, nose and throat: No hearing loss, ear pain, nasal congestion, sore throat Cardiovascular: No chest pain, palpitations Respiratory:  No shortness of breath at rest or with exertion, wheezes GastrointestinaI: No nausea, vomiting, diarrhea, abdominal pain, fecal incontinence Genitourinary:  No dysuria, urinary retention or frequency Musculoskeletal:  + neck pain, back pain Integumentary: No rash, pruritus, skin lesions Neurological: as above Psychiatric: No depression, insomnia, +anxiety Endocrine: No palpitations, fatigue, diaphoresis, mood swings, change in appetite, change in weight, increased thirst Hematologic/Lymphatic:  No anemia, purpura, petechiae. Allergic/Immunologic: no itchy/runny eyes, nasal congestion, recent allergic reactions, rashes  PHYSICAL EXAM: Filed Vitals:   08/31/14 1351  BP: 122/70  Pulse: 83  Resp: 16   General: No acute distress Head:  Normocephalic/atraumatic Eyes: Fundoscopic exam shows bilateral sharp discs, no vessel changes, exudates, or hemorrhages Neck: supple, no paraspinal tenderness, full range of motion Back: No paraspinal tenderness Heart: regular rate and  rhythm Lungs: Clear to auscultation bilaterally. Vascular: No carotid bruits. Skin/Extremities: No rash, no edema Neurological Exam: Mental status: alert and oriented to person, place, and time, no dysarthria or aphasia, Fund of knowledge is appropriate.  Recent and remote memory are intact.  Attention and concentration are normal.    Able to name objects and repeat phrases.  MMSE - Mini Mental State Exam 08/31/2014  Orientation to time 5  Orientation to Place 5  Registration 3  Attention/ Calculation 5  Recall 2  Language- name 2 objects 2  Language- repeat 1  Language- follow 3 step command 3  Language- read & follow direction 1  Write a sentence 1  Copy design 1  Total score 29   Cranial nerves: CN I: not tested CN II: pupils equal, round and reactive to light, visual fields intact, fundi unremarkable. CN III, IV, VI:  full range of motion, no nystagmus, no ptosis CN V: facial sensation intact CN VII: upper and lower face symmetric CN VIII: hearing intact to finger rub CN IX, X: gag intact, uvula midline CN XI: sternocleidomastoid and trapezius muscles intact CN XII: tongue midline Bulk & Tone: normal, no fasciculations. Motor: 5/5 throughout with no pronator drift. Sensation: reports slight decreased cold on left UE. Otherwise intact to light touch, pin, vibration and joint position sense.  No extinction to double simultaneous stimulation.  Romberg test negative Deep Tendon Reflexes: +2 throughout, no ankle clonus Plantar responses: downgoing bilaterally Cerebellar: no incoordination on finger to nose, heel to shin. No dysdiadochokinesia Gait: narrow-based and steady, able to tandem walk adequately. Tremor: none  IMPRESSION: This is a 69 year old right-handed woman with a history of anxiety, fibromyalgia, chronic back pain, migraines, presenting for worsening memory and chronic daily headaches. Her neurological exam is unremarkable except for subjective decreased sensation  on left arm. MMSE normal 29/30. We discussed different causes of memory loss. Records from PCP will be requested for recent TSH and B12. Head CT without contrast will be ordered to assess for underlying structural abnormality. We also discussed pseudodementia and effects of depression/anxiety and chronic headaches on memory. No indication to start cholinesterase inhibitors at this time. We discussed treatment of anxiety/depression, she is agreeable to seeing Behavioral Health. For the daily headaches, there  is likely a component of medication overuse, she was advised to minimize any rescue medication, particularly Percocet, to 2-3 a week. She will start gabapentin for headache prophylaxis, 300mg  qhs x 2 weeks, then increase to 1 cap BID. Side effects were discussed. We discussed the importance of physical exercise and brain stimulation exercises for brain health. She will keep a calendar of her headaches and follow-up in 3 months.   Thank you for allowing me to participate in the care of this patient. Please do not hesitate to call for any questions or concerns.   Ellouise Newer, M.D.  CC: Dr. Forde Dandy

## 2014-08-31 NOTE — Patient Instructions (Signed)
1. Start Gabapentin 300mg : Take 1 capsule at bedtime for 2 weeks, then increase to 1 capsule twice a day. Monitor for drowsiness 2. Schedule head CT without contrast 3. Refer to Behavioral Medicine for anxiety 4. Minimize intake of Percocet or any other rescue medication (Tylenol, Advil) to 2-3 a week to avoid rebound headaches 5. Physical exercise and brain stimulation exercises are important for brain health 6. Follow-up in 3 months, call for any problems

## 2014-09-08 ENCOUNTER — Other Ambulatory Visit: Payer: Medicare Other

## 2014-09-13 ENCOUNTER — Other Ambulatory Visit: Payer: Medicare Other

## 2014-09-13 ENCOUNTER — Telehealth: Payer: Self-pay | Admitting: Neurology

## 2014-09-13 NOTE — Telephone Encounter (Signed)
Lmom to return my call. 

## 2014-09-13 NOTE — Telephone Encounter (Signed)
Pt no showed ct scan today Nicole [name] [phone] [facility] imaging called

## 2014-09-14 NOTE — Telephone Encounter (Signed)
I called patient again and was able to speak with her. She states she forgot to call [facility] to reschedule her appt, she has been in [place] at the hospital with her grandson. She is going to call [facility] to reschedule her appt.

## 2014-09-20 ENCOUNTER — Ambulatory Visit
Admission: RE | Admit: 2014-09-20 | Discharge: 2014-09-20 | Disposition: A | Discharge status: Home / Self Care | Payer: Medicare Other | Source: Ambulatory Visit | Attending: Neurology | Admitting: Neurology

## 2014-09-20 ENCOUNTER — Telehealth: Payer: Self-pay | Admitting: Family Medicine

## 2014-09-20 NOTE — Telephone Encounter (Signed)
-----   Message from Cameron Sprang, MD sent at 09/20/2014  3:40 PM EDT ----- Pls let her know head CT looks good, no evidence of tumor, stroke, or bleed. Thanks

## 2014-09-20 NOTE — Telephone Encounter (Signed)
Patient was notified of results.  

## 2014-12-06 ENCOUNTER — Ambulatory Visit (INDEPENDENT_AMBULATORY_CARE_PROVIDER_SITE_OTHER): Payer: Medicare Other | Admitting: Neurology

## 2014-12-06 ENCOUNTER — Encounter: Payer: Self-pay | Admitting: Neurology

## 2014-12-06 VITALS — BP 120/80 | HR 80 | Resp 16 | Ht 61.0 in | Wt 176.0 lb

## 2014-12-06 DIAGNOSIS — R51 Headache: Secondary | ICD-10-CM | POA: Diagnosis not present

## 2014-12-06 DIAGNOSIS — R413 Other amnesia: Secondary | ICD-10-CM | POA: Diagnosis not present

## 2014-12-06 DIAGNOSIS — R519 Headache, unspecified: Secondary | ICD-10-CM

## 2014-12-06 MED ORDER — GABAPENTIN 300 MG PO CAPS: ORAL_CAPSULE | ORAL | Status: DC

## 2014-12-06 NOTE — Progress Notes (Signed)
NEUROLOGY FOLLOW UP OFFICE NOTE  Nicole [name] [id number]  HISTORY OF PRESENT ILLNESS: I had the pleasure of seeing Nicole [name] in follow-up in the neurology clinic on 12/06/2014.  The patient was last seen 3 months ago for memory loss and worsening migraines. Records and images were personally reviewed where available.  I personally reviewed head CT without contrast which was unremarkable. She reports that after starting gabapentin, the headaches were gone the next day and she felt well for a month, however afterwards the headaches came back, more on the right temporal region. She denies any dizziness but feels unsteady, like her walking is not straight. She denies any falls, no focal numbness/tingling/weakness, but occasionally her legs would be throbbing at night and wake her up. She feels her memory is unchanged, she denies getting lost or missing medications. She had some problems with bills, she let her bank do it and "now is a great big mess." No difficulties with ADLs. She denies any vision changes, no diplopia, blurred vision, dysarthria, dysphagia, bowel/bladder dysfunction, tremors.  HPI: This is a 69 yo RH woman with a history of anxiety, fibromyalgia, chronic back pain, and migraines, who presented for memory loss and chronic daily headaches. She is unsure if the memory changes are due to the headaches. She reports that headaches started in her 77s, she has "always had headaches and just lived with them," occurring once every 1-2 weeks. Over the past few years, headaches have increased in frequency now occurring daily, at times waking her up in the middle of the night. She has a constant bad around her head that she describes as "stress headaches." In addition, she has more severe migraines occurring 2-3 times a week, lasting 2-3 days, with sharp pain on the left temple, going down her left eye where she feels her left eye droops down, followed by stiffness on the left side of her neck.  These would be associated with nausea, photo and phonophobia where she needs to go to bed and needs 2-3 days to recover. She denies any tearing but has noticed her left eye would get red. She also has sinus headaches, but can feel the difference from her other headaches. No clear triggers to the headaches. She had previously seen neurologist Dr. Erling Cruz and had several tests, diagnosed with "unexplained headaches." She recalls taking Triavil (combination amitriptyline and perphenazine) which "took them away totally" however this was taken off the market. She has been on Zoloft for many years. She has occasional lightheadedness with the headaches, as well as panic attacks, and would take prn ativan. Her grandmother had severe headaches, a brother had cluster headaches. She does not recall trying any triptans. She has been taking Percocet for the headaches, and reports that she does not take more than 1 or 2 a day.  She has noticed memory changes over the past year where she would get confused. She would call her daughter and ask her what the date is. When she pays her bills, she "goes into a fog" and pushes them back "like they are not there." She knows there are bills to pay but "just does not have the energy" to do it. She denies getting lost driving or leaving the stove on. She lives alone and denies any difficulties with ADLs. She almost never forgets to take her medications, using a pillbox. She has slight word-finding difficulties. She loses her bifocals all the time. She is sleeping a lot, and would wake up 4 hours later  wondering where the time went. Her grandmother had memory issues, a daughter also has memory changes that she attributes to fibromyalgia and multiple medications.  She occasionally has anosmia, occasional neck and chronic back pain. She occasionally drinks alcohol but this makes her feel very hot. No head injuries.   PAST MEDICAL HISTORY: Past Medical History  Diagnosis Date  . Vaginitis     . Anemia   . Anxiety   . Depression   . Diabetes mellitus     prediabetic  . Urinary incontinence   . Endometriosis     history of  . Asthma   . Fibromyalgia   . GERD (gastroesophageal reflux disease)   . Hypercholesterolemia   . Obesity   . SOB (shortness of breath)   . Chest pain   . Dizziness   . Blurred vision     occasional  . Leg weakness   . Joint pain   . Back pain   . Fatigue   . Bloating   . Forgetfulness   . Colon polyps     MEDICATIONS: Current Outpatient Prescriptions on File Prior to Visit  Medication Sig Dispense Refill  . Clobetasol Prop Emollient Base 0.05 % emollient cream Apply nightly to affected area. 30 g 0  . ergocalciferol (VITAMIN D2) 50000 UNITS capsule Take 50,000 Units by mouth once a week.      . fenofibrate 160 MG tablet Take 160 mg by mouth daily.    . Fluticasone-Salmeterol (ADVAIR DISKUS) 250-50 MCG/DOSE AEPB Inhale 1 puff into the lungs 2 (two) times daily.    Marland Kitchen gabapentin (NEURONTIN) 300 MG capsule Take 1 capsule at bedtime for 2 weeks, then increase to 1 capsule twice a day (Patient taking differently: Take 300 mg by mouth 2 (two) times daily. ) 60 capsule 4  . LORazepam (ATIVAN) 1 MG tablet 1 mg 2 (two) times daily.     . meloxicam (MOBIC) 15 MG tablet Take 15 mg by mouth daily.    Marland Kitchen oxyCODONE-acetaminophen (PERCOCET) 10-325 MG per tablet Take 1 tablet by mouth 3 (three) times daily as needed.     . pantoprazole (PROTONIX) 40 MG tablet Take 1 tablet (40 mg total) by mouth 2 (two) times daily. 60 tablet 6  . sertraline (ZOLOFT) 25 MG tablet Take 25 mg by mouth daily. Takes with a 50 mg tablet    . sertraline (ZOLOFT) 50 MG tablet Take 50 mg by mouth daily. Takes with a 25 mg tablet    . simvastatin (ZOCOR) 40 MG tablet Take 40 mg by mouth at bedtime.      . vitamin B-12 (CYANOCOBALAMIN) 1000 MCG tablet Take 1,000 mcg by mouth daily.    Marland Kitchen aspirin 81 MG tablet Take 81 mg by mouth daily.     No current facility-administered medications  on file prior to visit.    ALLERGIES: Allergies  Allergen Reactions  . Fish-Derived Products Anaphylaxis  . Shellfish Allergy Shortness Of Breath    In dye for scan.  . Aspirin Nausea Only    FAMILY HISTORY: Family History  Problem Relation Age of Onset  . Hypertension Mother   . Heart failure Mother   . Diabetes Mother   . Hypertension Father   . Heart failure Father   . Diabetes Father   . Heart attack Father   . Stroke Father   . Cancer Brother     lung  . Thyroid disease Brother   . Cancer Paternal Aunt     bone  .  Cancer Paternal Uncle     lung  . Hypertension Maternal Grandmother   . Hypertension Maternal Grandfather   . Hypertension Paternal Grandmother   . Hypertension Paternal Grandfather   . Cancer Brother     throat  . Diabetes Daughter   . Colon cancer Neg Hx   . Stomach cancer Neg Hx     SOCIAL HISTORY: Social History   Social History  . Marital Status: Widowed    Spouse Name: N/A  . Number of Children: 1  . Years of Education: N/A   Occupational History  . disability    Social History Main Topics  . Smoking status: Never Smoker   . Smokeless tobacco: Never Used  . Alcohol Use: 0.0 oz/week    0 Standard drinks or equivalent per week     Comment: occasional  . Drug Use: No  . Sexual Activity: Not Currently   Other Topics Concern  . Not on file   Social History Narrative    REVIEW OF SYSTEMS: Constitutional: No fevers, chills, or sweats, no generalized fatigue, change in appetite Eyes: No visual changes, double vision, eye pain Ear, nose and throat: No hearing loss, ear pain, nasal congestion, sore throat Cardiovascular: No chest pain, palpitations Respiratory:  No shortness of breath at rest or with exertion, wheezes GastrointestinaI: No nausea, vomiting, diarrhea, abdominal pain, fecal incontinence Genitourinary:  No dysuria, urinary retention or frequency Musculoskeletal:  No neck pain, back pain Integumentary: No rash,  pruritus, skin lesions Neurological: as above Psychiatric: No depression, insomnia, anxiety Endocrine: No palpitations, fatigue, diaphoresis, mood swings, change in appetite, change in weight, increased thirst Hematologic/Lymphatic:  No anemia, purpura, petechiae. Allergic/Immunologic: no itchy/runny eyes, nasal congestion, recent allergic reactions, rashes  PHYSICAL EXAM: Filed Vitals:   12/06/14 1441  BP: 120/80  Pulse: 80  Resp: 16   General: No acute distress Head:  Normocephalic/atraumatic Neck: supple, no paraspinal tenderness, full range of motion Heart:  Regular rate and rhythm Lungs:  Clear to auscultation bilaterally Back: No paraspinal tenderness Skin/Extremities: No rash, no edema Neurological Exam: alert and oriented to person, place, and time. No aphasia or dysarthria. Fund of knowledge is appropriate.  Recent and remote memory are intact. 3/3 delayed recall.  Attention and concentration are normal.    Able to name objects and repeat phrases. Cranial nerves: Pupils equal, round, reactive to light.  Fundoscopic exam unremarkable, no papilledema. Extraocular movements intact with no nystagmus. Visual fields full. Facial sensation intact. No facial asymmetry. Tongue, uvula, palate midline.  Motor: Bulk and tone normal, muscle strength 5/5 throughout with no pronator drift.  Sensation to light touch intact.  No extinction to double simultaneous stimulation.  Deep tendon reflexes 2+ throughout, toes downgoing.  Finger to nose testing intact.  Gait narrow-based and steady, able to tandem walk adequately.  Romberg negative.  IMPRESSION: This is a 69 yo RH woman with a history of anxiety, fibromyalgia, chronic back pain, migraines, who presented for worsening memory and chronic daily headaches. Head CT unremarkable. Her MMSE in June 2016 was normal 29/30. She had initial good response to low dose gabapentin, but now reports daily headaches are back. She will increase dose to 600mg  BID  over the next week. Side effects were discussed. We again discussed effects of anxiety/mood on memory, as well as the importance of physical exercise and brain stimulation exercises for brain health. She will keep a calendar of her headaches and follow-up in 3 months.   Thank you for allowing me to participate  in her care.  Please do not hesitate to call for any questions or concerns.  The duration of this appointment visit was 24 minutes of face-to-face time with the patient.  Greater than 50% of this time was spent in counseling, explanation of diagnosis, planning of further management, and coordination of care.   Ellouise Newer, M.D.   CC: Dr. Forde Dandy

## 2014-12-06 NOTE — Patient Instructions (Signed)
1. Increase Gabapentin 300mg : Take 1 cap in AM, 2 caps in PM for 1 week, then increase to 2 caps twice a day 2. Keep a calendar of your headaches 3. Physical exercise and brain stimulation exercises (crossword puzzles, word search, etc) are important for brain health 4. Follow-up in 3 months, call for any problems

## 2014-12-07 DIAGNOSIS — R51 Headache: Principal | ICD-10-CM

## 2014-12-07 DIAGNOSIS — R519 Headache, unspecified: Secondary | ICD-10-CM | POA: Insufficient documentation

## 2015-03-14 ENCOUNTER — Ambulatory Visit (INDEPENDENT_AMBULATORY_CARE_PROVIDER_SITE_OTHER): Payer: Medicare Other | Admitting: Neurology

## 2015-03-14 ENCOUNTER — Encounter: Payer: Self-pay | Admitting: Neurology

## 2015-03-14 VITALS — BP 140/76 | HR 80 | Wt 185.4 lb

## 2015-03-14 DIAGNOSIS — R519 Headache, unspecified: Secondary | ICD-10-CM

## 2015-03-14 DIAGNOSIS — R51 Headache: Secondary | ICD-10-CM | POA: Diagnosis not present

## 2015-03-14 DIAGNOSIS — F419 Anxiety disorder, unspecified: Secondary | ICD-10-CM | POA: Diagnosis not present

## 2015-03-14 MED ORDER — PREGABALIN 100 MG PO CAPS: ORAL_CAPSULE | ORAL | Status: DC

## 2015-03-14 NOTE — Patient Instructions (Signed)
1. Reduce Gabapentin 300mg : Take 1 capsule twice a day for 4 days, then stop 2. Start Lyrica 50mg  sample pack: Take 1 capsule twice a day for 4 days, then increase to 1 cap in AM, 2 caps in PM for 4 days, then increase to 2 caps twice a day. Once done with samples, your prescription in the pharmacy will be for Lyrica 100mg : Take 1 cap twice a day. 3. Discuss switching Zoloft to Cymbalta with your PCP 4. Recommend seeing psychiatry for the anxiety and depression 5. Follow-up in 3 months

## 2015-03-14 NOTE — Progress Notes (Signed)
NEUROLOGY FOLLOW UP OFFICE NOTE  Nicole [name] [id number]  HISTORY OF PRESENT ILLNESS: I had the pleasure of seeing Nicole [name] in follow-up in the neurology clinic on 03/14/2015.  The patient was last seen 3 months ago for memory loss and worsening migraines. On her last visit, gabapentin dose was increased to 600mg  BID after she reported an initial good response. She reports that she has not seen any difference, she continues to have daily headaches lasting throughout the day. She has occasional dizziness. She also reports body aches in her shoulders and upper arms. She takes Percocet on a daily basis. She lives by herself and denies getting lost driving, but feels she is having some panic attacks, on the way here she felt hot then cold and restless, wanting to take off her jacket. She has been on Zoloft for at least 20 years, and states she is "addicted" to Ativan for anxiety. She denies any falls but has slipped a couple of times, no injuries. No difficulties with ADLs. She denies any vision changes, no diplopia, blurred vision, dysarthria, dysphagia, bowel/bladder dysfunction, tremors.  HPI: [name] with a history of anxiety, fibromyalgia, chronic back pain, and migraines, who presented for memory loss and chronic daily headaches. She is unsure if the memory changes are due to the headaches. She reports that headaches started in her 21s, she has "always had headaches and just lived with them," occurring once every 1-2 weeks. Over the past few years, headaches have increased in frequency now occurring daily, at times waking her up in the middle of the night. She has a constant bad around her head that she describes as "stress headaches." In addition, she has more severe migraines occurring 2-3 times a week, lasting 2-3 days, with sharp pain on the left temple, going down her left eye where she feels her left eye droops down, followed by stiffness on the left side of her neck.  These would be associated with nausea, photo and phonophobia where she needs to go to bed and needs 2-3 days to recover. She denies any tearing but has noticed her left eye would get red. She also has sinus headaches, but can feel the difference from her other headaches. No clear triggers to the headaches. She had previously seen neurologist Dr. Erling Cruz and had several tests, diagnosed with "unexplained headaches." She recalls taking Triavil (combination amitriptyline and perphenazine) which "took them away totally" however this was taken off the market. She has been on Zoloft for many years. She has occasional lightheadedness with the headaches, as well as panic attacks, and would take prn ativan. Her grandmother had severe headaches, a brother had cluster headaches. She does not recall trying any triptans. She has been taking Percocet for the headaches, and reports that she does not take more than 1 or 2 a day.  She has noticed memory changes over the past year where she would get confused. She would call her daughter and ask her what the date is. When she pays her bills, she "goes into a fog" and pushes them back "like they are not there." She knows there are bills to pay but "just does not have the energy" to do it. She denies getting lost driving or leaving the stove on. She lives alone and denies any difficulties with ADLs. She almost never forgets to take her medications, using a pillbox. She has slight word-finding difficulties. She loses her bifocals all the time. She is sleeping  a lot, and would wake up 4 hours later wondering where the time went. Her grandmother had memory issues, a daughter also has memory changes that she attributes to fibromyalgia and multiple medications.  She occasionally has anosmia, occasional neck and chronic back pain. She occasionally drinks alcohol but this makes her feel very hot. No head injuries.   Diagnostic Data: Head CT without contrast normal.  PAST MEDICAL  HISTORY: Past Medical History  Diagnosis Date  . Vaginitis   . Anemia   . Anxiety   . Depression   . Diabetes mellitus     prediabetic  . Urinary incontinence   . Endometriosis     history of  . Asthma   . Fibromyalgia   . GERD (gastroesophageal reflux disease)   . Hypercholesterolemia   . Obesity   . SOB (shortness of breath)   . Chest pain   . Dizziness   . Blurred vision     occasional  . Leg weakness   . Joint pain   . Back pain   . Fatigue   . Bloating   . Forgetfulness   . Colon polyps     MEDICATIONS: Current Outpatient Prescriptions on File Prior to Visit  Medication Sig Dispense Refill  . aspirin 81 MG tablet Take 81 mg by mouth daily.    . Clobetasol Prop Emollient Base 0.05 % emollient cream Apply nightly to affected area. 30 g 0  . ergocalciferol (VITAMIN D2) 50000 UNITS capsule Take 50,000 Units by mouth once a week.      . fenofibrate 160 MG tablet Take 160 mg by mouth daily.    . Fluticasone-Salmeterol (ADVAIR DISKUS) 250-50 MCG/DOSE AEPB Inhale 1 puff into the lungs 2 (two) times daily.    Marland Kitchen gabapentin (NEURONTIN) 300 MG capsule Take 2 caps twice a day 120 capsule 4  . LORazepam (ATIVAN) 1 MG tablet 1 mg 2 (two) times daily.     . meloxicam (MOBIC) 15 MG tablet Take 15 mg by mouth daily.    Marland Kitchen oxyCODONE-acetaminophen (PERCOCET) 10-325 MG per tablet Take 1 tablet by mouth 3 (three) times daily as needed.     . pantoprazole (PROTONIX) 40 MG tablet Take 1 tablet (40 mg total) by mouth 2 (two) times daily. 60 tablet 6  . sertraline (ZOLOFT) 25 MG tablet Take 25 mg by mouth daily. Takes with a 50 mg tablet    . sertraline (ZOLOFT) 50 MG tablet Take 50 mg by mouth daily. Takes with a 25 mg tablet    . simvastatin (ZOCOR) 40 MG tablet Take 40 mg by mouth at bedtime.      . vitamin B-12 (CYANOCOBALAMIN) 1000 MCG tablet Take 1,000 mcg by mouth daily.     No current facility-administered medications on file prior to visit.    ALLERGIES: Allergies  Allergen  Reactions  . Fish-Derived Products Anaphylaxis  . Shellfish Allergy Shortness Of Breath    In dye for scan.  . Aspirin Nausea Only    FAMILY HISTORY: Family History  Problem Relation Age of Onset  . Hypertension Mother   . Heart failure Mother   . Diabetes Mother   . Hypertension Father   . Heart failure Father   . Diabetes Father   . Heart attack Father   . Stroke Father   . Cancer Brother     lung  . Thyroid disease Brother   . Cancer Paternal Aunt     bone  . Cancer Paternal Uncle  lung  . Hypertension Maternal Grandmother   . Hypertension Maternal Grandfather   . Hypertension Paternal Grandmother   . Hypertension Paternal Grandfather   . Cancer Brother     throat  . Diabetes Daughter   . Colon cancer Neg Hx   . Stomach cancer Neg Hx     SOCIAL HISTORY: Social History   Social History  . Marital Status: Widowed    Spouse Name: N/A  . Number of Children: 1  . Years of Education: N/A   Occupational History  . disability    Social History Main Topics  . Smoking status: Never Smoker   . Smokeless tobacco: Never Used  . Alcohol Use: 0.0 oz/week    0 Standard drinks or equivalent per week     Comment: occasional  . Drug Use: No  . Sexual Activity: Not Currently   Other Topics Concern  . Not on file   Social History Narrative    REVIEW OF SYSTEMS: Constitutional: No fevers, chills, or sweats, no generalized fatigue, change in appetite Eyes: No visual changes, double vision, eye pain Ear, nose and throat: No hearing loss, ear pain, nasal congestion, sore throat Cardiovascular: No chest pain, palpitations Respiratory:  No shortness of breath at rest or with exertion, wheezes GastrointestinaI: No nausea, vomiting, diarrhea, abdominal pain, fecal incontinence Genitourinary:  No dysuria, urinary retention or frequency Musculoskeletal:  + neck pain, back pain Integumentary: No rash, pruritus, skin lesions Neurological: as above Psychiatric: No  depression, insomnia, anxiety Endocrine: No palpitations, fatigue, diaphoresis, mood swings, change in appetite, change in weight, increased thirst Hematologic/Lymphatic:  No anemia, purpura, petechiae. Allergic/Immunologic: no itchy/runny eyes, nasal congestion, recent allergic reactions, rashes  PHYSICAL EXAM: Filed Vitals:   03/14/15 1526  BP: 140/76  Pulse: 80   General: No acute distress Head:  Normocephalic/atraumatic Neck: supple, no paraspinal tenderness, full range of motion Heart:  Regular rate and rhythm Lungs:  Clear to auscultation bilaterally Back: No paraspinal tenderness Skin/Extremities: No rash, no edema Neurological Exam: alert and oriented to person, place, and time. No aphasia or dysarthria. Fund of knowledge is appropriate.  Recent and remote memory are intact. 3/3 delayed recall.  Attention and concentration are normal.    Able to name objects and repeat phrases. Cranial nerves: Pupils equal, round, reactive to light.  Fundoscopic exam unremarkable, no papilledema. Extraocular movements intact with no nystagmus. Visual fields full. Facial sensation intact. No facial asymmetry. Tongue, uvula, palate midline.  Motor: Bulk and tone normal, muscle strength 5/5 throughout with no pronator drift.  Sensation to light touch intact.  No extinction to double simultaneous stimulation.  Deep tendon reflexes 2+ throughout, toes downgoing.  Finger to nose testing intact.  Gait narrow-based and steady, able to tandem walk adequately.  Romberg negative.  IMPRESSION: [name] with a history of anxiety, fibromyalgia, chronic back pain, migraines, who presented for worsening memory and chronic daily headaches. Head CT unremarkable. Her MMSE in June 2016 was normal 29/30. She had initial good response to gabapentin, but today continues to report daily headaches, as well as body aches. She will try switching from gabapentin to Lyrica to see if this helps with headaches and  fibromyalgia. She was given a tapering schedule for gabapentin and uptitration schedule for Lyrica. Side effects were discussed. There is likely a component of medication overuse headaches as well with daily Percocet intake, she was instructed to minimize intake to 2-3 a week to avoid rebound headaches. She reports panic attacks  and will discuss this with her PCP, including consideration for switching to Cymbalta in the future to also help with body pains. She is interested in seeing a psychiatrist for the anxiety and depression. She will follow-up in 3 months.   Thank you for allowing me to participate in her care.  Please do not hesitate to call for any questions or concerns.  The duration of this appointment visit was 25 minutes of face-to-face time with the patient.  Greater than 50% of this time was spent in counseling, explanation of diagnosis, planning of further management, and coordination of care.   Ellouise Newer, M.D.   CC: Dr. Forde Dandy

## 2015-05-01 ENCOUNTER — Encounter: Payer: Medicare Other | Admitting: Gynecology

## 2015-05-02 ENCOUNTER — Telehealth: Payer: Self-pay | Admitting: Neurology

## 2015-05-02 NOTE — Telephone Encounter (Signed)
Pt needs a letter to get out of jury duty please call [phone]

## 2015-05-02 NOTE — Telephone Encounter (Signed)
Please review

## 2015-05-02 NOTE — Telephone Encounter (Signed)
Pls ask if there is a form that I fill out, thanks

## 2015-05-03 NOTE — Telephone Encounter (Signed)
Lmovm to rtn my call. 

## 2015-05-04 NOTE — Telephone Encounter (Signed)
PT wanted a letter from Dr Delice Lesch to get her out of jury duty/[place] [phone]

## 2015-05-04 NOTE — Telephone Encounter (Signed)
No form. Needs letter stating that she can't serve from medical stand point and why. Jury date of 05/25/15.

## 2015-05-05 ENCOUNTER — Encounter: Payer: Self-pay | Admitting: Neurology

## 2015-05-05 NOTE — Telephone Encounter (Signed)
Please call patient on the cell phone number [phone]

## 2015-05-05 NOTE — Telephone Encounter (Signed)
LMOM letting patient know letter written and at our front desk for pick up.

## 2015-05-23 ENCOUNTER — Telehealth: Payer: Self-pay | Admitting: *Deleted

## 2015-05-23 ENCOUNTER — Encounter: Payer: Self-pay | Admitting: Gynecology

## 2015-05-23 ENCOUNTER — Ambulatory Visit (INDEPENDENT_AMBULATORY_CARE_PROVIDER_SITE_OTHER): Payer: Medicare Other | Admitting: Gynecology

## 2015-05-23 VITALS — BP 136/80 | Ht 62.0 in | Wt 180.0 lb

## 2015-05-23 DIAGNOSIS — N952 Postmenopausal atrophic vaginitis: Secondary | ICD-10-CM | POA: Diagnosis not present

## 2015-05-23 DIAGNOSIS — Z01419 Encounter for gynecological examination (general) (routine) without abnormal findings: Secondary | ICD-10-CM

## 2015-05-23 MED ORDER — NONFORMULARY OR COMPOUNDED ITEM: Status: DC

## 2015-05-23 NOTE — Patient Instructions (Signed)
Start on the vaginal estrogen cream from [facility] twice weekly. Call me if you have any issues with this.

## 2015-05-23 NOTE — Telephone Encounter (Signed)
-----   Message from Anastasio Auerbach, MD sent at 05/23/2015 11:55 AM EDT ----- [facility] vaginal estradiol cream prefilled syringes twice weekly refill 1 year

## 2015-05-23 NOTE — Progress Notes (Signed)
    ELERI TINER December 04, 1945 [id number]        70 y.o.  G1P1  for breast and pelvic exam. Several issues noted below.  Past medical history,surgical history, problem list, medications, allergies, family history and social history were all reviewed and documented as reviewed in the EPIC chart.  ROS:  Performed with pertinent positives and negatives included in the history, assessment and plan.   Additional significant findings :  none   Exam: Caryn Bee assistant Filed Vitals:   05/23/15 1126  BP: 136/80  Height: 5\' 2"  (1.575 m)  Weight: 180 lb (81.647 kg)   General appearance:  Normal affect, orientation and appearance. Skin: Grossly normal HEENT: Without gross lesions.  No cervical or supraclavicular adenopathy. Thyroid normal.  Lungs:  Clear without wheezing, rales or rhonchi Cardiac: RR, without RMG Abdominal:  Soft, nontender, without masses, guarding, rebound, organomegaly or hernia Breasts:  Examined lying and sitting without masses, retractions, discharge or axillary adenopathy. Pelvic:  Ext/BUS/vagina with atrophic changes  Adnexa without masses or tenderness    Anus and perineum normal   Rectovaginal normal sphincter tone without palpated masses or tenderness.    Assessment/Plan:  70 y.o. G1P1 female for breast and pelvic exam.   1. Postmenopausal/atrophic genital changes. Patient notes vaginal dryness on a daily basis.  Also had attempted intercourse several times with significant discomfort. Exam does show generalized atrophic changes. Prior vulvitis gone. Discussed options and ultimately we both decided for a trial of vaginal estrogen cream [facility]. Risks to include absorption with systemic effects such as thrombosis with stroke heart attack DVT or breast cancer reviewed.  Patient will follow up if continues to be an issue after starting the estrogen. 2. Mammography coming due this spring and she will call and schedule this. SBE  monthly reviewed. 3. Colonoscopy 2013. Repeat at their recommended interval. 4. DEXA 2013 normal. Plan repeat next year at five-year interval.  Increased calcium and vitamin D. 5. Pap smear 04/2011. No Pap smear done today. No history of significant abnormal Pap smears. We discussed current screening guidelines and based on age and hysterectomy history we both agree to stop screening. 6. Health maintenance. No routine lab work done as patient does this at her primary physician's office. Follow up 1 year, sooner as needed    Anastasio Auerbach MD, 11:51 AM 05/23/2015

## 2015-05-23 NOTE — Telephone Encounter (Signed)
Rx faxed

## 2015-06-08 ENCOUNTER — Ambulatory Visit (INDEPENDENT_AMBULATORY_CARE_PROVIDER_SITE_OTHER): Payer: Medicare Other | Admitting: Psychology

## 2015-06-08 DIAGNOSIS — F3130 Bipolar disorder, current episode depressed, mild or moderate severity, unspecified: Secondary | ICD-10-CM

## 2015-06-12 ENCOUNTER — Encounter (HOSPITAL_COMMUNITY): Payer: Self-pay | Admitting: Psychology
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
## 2015-06-20 ENCOUNTER — Ambulatory Visit (INDEPENDENT_AMBULATORY_CARE_PROVIDER_SITE_OTHER): Payer: Medicare Other | Admitting: Neurology

## 2015-06-20 ENCOUNTER — Encounter: Payer: Self-pay | Admitting: Neurology

## 2015-06-20 VITALS — BP 122/90 | HR 74 | Resp 16 | Wt 183.0 lb

## 2015-06-20 DIAGNOSIS — M797 Fibromyalgia: Secondary | ICD-10-CM | POA: Diagnosis not present

## 2015-06-20 DIAGNOSIS — R51 Headache: Secondary | ICD-10-CM | POA: Diagnosis not present

## 2015-06-20 DIAGNOSIS — R413 Other amnesia: Secondary | ICD-10-CM | POA: Diagnosis not present

## 2015-06-20 DIAGNOSIS — R519 Headache, unspecified: Secondary | ICD-10-CM

## 2015-06-20 MED ORDER — PREGABALIN 100 MG PO CAPS: ORAL_CAPSULE | ORAL | Status: DC

## 2015-06-20 NOTE — Patient Instructions (Signed)
1. Increase Lyrica 100mg : Take 1 capsule in AM, 2 capsules in PM until you finish your current bottle, then your new prescription will be for Lyrica 100mg : take 2 capsules twice a day 2. Minimize intake of Percocet for headaches to 2-3 times a week 3. Follow-up in 5 months, call for any changes

## 2015-06-20 NOTE — Progress Notes (Signed)
NEUROLOGY FOLLOW UP OFFICE NOTE  SHAYLIE ESSENMACHER [id number]  HISTORY OF PRESENT ILLNESS: I had the pleasure of seeing Nicole [name] in follow-up in the neurology clinic on 06/20/2015.  The patient was last seen 3 months ago for memory loss and worsening migraines. On her last visit, she reported continued daily headaches on gabapentin, as well as body aches. She was switched to Lyrica. She was also noted to take Percocet on a daily basis and was instructed to minimize intake to avoid rebound headaches. She is currently on Lyrica 100mg  BID with no side effects. She continues to have headaches on a daily basis, stating she may be a little better, but is not doing so well due to diffuse body aches in her lower back and legs, shoulders, "aching all over." She was started on Cymbalta 30mg  daily with no side effects. She continues to take Percocet on a daily basis, stating she had been taking it more frequently this month for body aches and headaches. She reports that she is still forgetful, overall she denies getting lost driving except she got lost coming here today and went to the 4th floor instead of the 3rd flood. She denies any missed medications. She denies any falls. No difficulties with ADLs. She denies any vision changes, no diplopia, blurred vision, dysarthria, dysphagia, bowel/bladder dysfunction, tremors.  HPI: [name] with a history of anxiety, fibromyalgia, chronic back pain, and migraines, who presented for memory loss and chronic daily headaches. She is unsure if the memory changes are due to the headaches. She reports that headaches started in her 51s, she has "always had headaches and just lived with them," occurring once every 1-2 weeks. Over the past few years, headaches have increased in frequency now occurring daily, at times waking her up in the middle of the night. She has a constant bad around her head that she describes as "stress headaches." In addition, she has more  severe migraines occurring 2-3 times a week, lasting 2-3 days, with sharp pain on the left temple, going down her left eye where she feels her left eye droops down, followed by stiffness on the left side of her neck. These would be associated with nausea, photo and phonophobia where she needs to go to bed and needs 2-3 days to recover. She denies any tearing but has noticed her left eye would get red. She also has sinus headaches, but can feel the difference from her other headaches. No clear triggers to the headaches. She had previously seen neurologist Dr. Erling Cruz and had several tests, diagnosed with "unexplained headaches." She recalls taking Triavil (combination amitriptyline and perphenazine) which "took them away totally" however this was taken off the market. She has been on Zoloft for many years. She has occasional lightheadedness with the headaches, as well as panic attacks, and would take prn ativan. Her grandmother had severe headaches, a brother had cluster headaches. She does not recall trying any triptans. She has been taking Percocet for the headaches, and reports that she does not take more than 1 or 2 a day.  She has noticed memory changes over the past year where she would get confused. She would call her daughter and ask her what the date is. When she pays her bills, she "goes into a fog" and pushes them back "like they are not there." She knows there are bills to pay but "just does not have the energy" to do it. She denies getting lost driving  or leaving the stove on. She lives alone and denies any difficulties with ADLs. She almost never forgets to take her medications, using a pillbox. She has slight word-finding difficulties. She loses her bifocals all the time. She is sleeping a lot, and would wake up 4 hours later wondering where the time went. Her grandmother had memory issues, a daughter also has memory changes that she attributes to fibromyalgia and multiple medications.  She occasionally  has anosmia, occasional neck and chronic back pain. She occasionally drinks alcohol but this makes her feel very hot. No head injuries.   Diagnostic Data: Head CT without contrast normal.  PAST MEDICAL HISTORY: Past Medical History  Diagnosis Date  . Vaginitis   . Anemia   . Anxiety   . Depression   . Diabetes mellitus     prediabetic  . Urinary incontinence   . Endometriosis     history of  . Asthma   . Fibromyalgia   . GERD (gastroesophageal reflux disease)   . Hypercholesterolemia   . Obesity   . SOB (shortness of breath)   . Chest pain   . Dizziness   . Blurred vision     occasional  . Leg weakness   . Joint pain   . Back pain   . Fatigue   . Bloating   . Forgetfulness   . Colon polyps   . Headache     migraines    MEDICATIONS: Current Outpatient Prescriptions on File Prior to Visit  Medication Sig Dispense Refill  . aspirin 81 MG tablet Take 81 mg by mouth daily.    . Clobetasol Prop Emollient Base 0.05 % emollient cream Apply nightly to affected area. 30 g 0  . DULoxetine (CYMBALTA) 30 MG capsule Take 30 mg by mouth daily.    . ergocalciferol (VITAMIN D2) 50000 UNITS capsule Take 50,000 Units by mouth once a week.      . estradiol (ESTRACE) 0.1 MG/GM vaginal cream Place 1 Applicatorful vaginally at bedtime.    . fenofibrate 160 MG tablet Take 160 mg by mouth daily.    . Fluticasone-Salmeterol (ADVAIR DISKUS) 250-50 MCG/DOSE AEPB Inhale 1 puff into the lungs 2 (two) times daily.    Marland Kitchen LORazepam (ATIVAN) 1 MG tablet 1 mg 2 (two) times daily.     . meloxicam (MOBIC) 15 MG tablet Take 15 mg by mouth daily.    . NONFORMULARY OR COMPOUNDED ITEM Estradiol 0.02% vaginal cream prefilled applicators apply one twice weekly 90 each 3  . oxyCODONE-acetaminophen (PERCOCET) 10-325 MG per tablet Take 1 tablet by mouth 3 (three) times daily as needed.     . pantoprazole (PROTONIX) 40 MG tablet Take 1 tablet (40 mg total) by mouth 2 (two) times daily. 60 tablet 6  .  polyethylene glycol powder (GLYCOLAX/MIRALAX) powder Take by mouth.    . pregabalin (LYRICA) 100 MG capsule Take 1 capsule twice a day 60 capsule 4  . simvastatin (ZOCOR) 40 MG tablet Take 40 mg by mouth at bedtime.       No current facility-administered medications on file prior to visit.    ALLERGIES: Allergies  Allergen Reactions  . Fish-Derived Products Anaphylaxis  . Shellfish Allergy Shortness Of Breath    In dye for scan.  . Aspirin Nausea Only    FAMILY HISTORY: Family History  Problem Relation Age of Onset  . Hypertension Mother   . Heart failure Mother   . Diabetes Mother   . Hypertension Father   . Heart  failure Father   . Diabetes Father   . Heart attack Father   . Stroke Father   . Cancer Brother     lung  . Thyroid disease Brother   . Cancer Paternal Aunt     bone  . Cancer Paternal Uncle     lung  . Hypertension Maternal Grandmother   . Hypertension Maternal Grandfather   . Hypertension Paternal Grandmother   . Hypertension Paternal Grandfather   . Cancer Brother     throat  . Diabetes Daughter   . Colon cancer Neg Hx   . Stomach cancer Neg Hx     SOCIAL HISTORY: Social History   Social History  . Marital Status: Widowed    Spouse Name: N/A  . Number of Children: 1  . Years of Education: N/A   Occupational History  . disability    Social History Main Topics  . Smoking status: Never Smoker   . Smokeless tobacco: Never Used  . Alcohol Use: 0.0 oz/week    0 Standard drinks or equivalent per week     Comment: occasional  . Drug Use: No  . Sexual Activity: Not Currently     Comment: 1st intercourse 70 yo-Fewer than 5 partners   Other Topics Concern  . Not on file   Social History Narrative    REVIEW OF SYSTEMS: Constitutional: No fevers, chills, or sweats, no generalized fatigue, change in appetite Eyes: No visual changes, double vision, eye pain Ear, nose and throat: No hearing loss, ear pain, nasal congestion, sore  throat Cardiovascular: No chest pain, palpitations Respiratory:  No shortness of breath at rest or with exertion, wheezes GastrointestinaI: No nausea, vomiting, diarrhea, abdominal pain, fecal incontinence Genitourinary:  No dysuria, urinary retention or frequency Musculoskeletal:  + neck pain, back pain Integumentary: No rash, pruritus, skin lesions Neurological: as above Psychiatric: No depression, insomnia, anxiety Endocrine: No palpitations, fatigue, diaphoresis, mood swings, change in appetite, change in weight, increased thirst Hematologic/Lymphatic:  No anemia, purpura, petechiae. Allergic/Immunologic: no itchy/runny eyes, nasal congestion, recent allergic reactions, rashes  PHYSICAL EXAM: Filed Vitals:   06/20/15 1525  BP: 122/90  Pulse: 74  Resp: 16   General: No acute distress Head:  Normocephalic/atraumatic Neck: supple, no paraspinal tenderness, full range of motion Heart:  Regular rate and rhythm Lungs:  Clear to auscultation bilaterally Back: No paraspinal tenderness Skin/Extremities: No rash, no edema Neurological Exam: alert and oriented to person, place, and time. No aphasia or dysarthria. Fund of knowledge is appropriate.  Recent and remote memory are intact. 3/3 delayed recall.  Attention and concentration are normal.    Able to name objects and repeat phrases. Cranial nerves: Pupils equal, round, reactive to light.  Fundoscopic exam unremarkable, no papilledema. Extraocular movements intact with no nystagmus. Visual fields full. Facial sensation intact. No facial asymmetry. Tongue, uvula, palate midline.  Motor: Bulk and tone normal, muscle strength 5/5 throughout with no pronator drift.  Sensation to light touch intact.  No extinction to double simultaneous stimulation.  Deep tendon reflexes 2+ throughout, toes downgoing.  Finger to nose testing intact.  Gait narrow-based and steady, able to tandem walk adequately.  Romberg negative.  IMPRESSION: This is a 70 yo RH  woman with a history of anxiety, fibromyalgia, chronic back pain, migraines, who presented for worsening memory and chronic daily headaches. Head CT unremarkable. Her MMSE in June 2016 was normal 29/30. She had initial good response to gabapentin, but then again started to report daily headaches. She is now  on Lyrica 100mg  BID and will increase to 200mg  BID. Side effects were again discussed. She has also been switched to Cymbalta 30mg  daily. Hopefully treatment of fibromyalgia will help with headaches as well. We also discussed medication overuse headaches, she was advised to minimize intake of Percocet for headaches to 2-3 a week to avoid rebound headaches. She has started seeing a therapist and would like to continue this. She will follow-up in 5 months.   Thank you for allowing me to participate in her care.  Please do not hesitate to call for any questions or concerns.  The duration of this appointment visit was 25 minutes of face-to-face time with the patient.  Greater than 50% of this time was spent in counseling, explanation of diagnosis, planning of further management, and coordination of care.   Ellouise Newer, M.D.   CC: Dr. Forde Dandy

## 2015-06-26 ENCOUNTER — Telehealth: Payer: Self-pay | Admitting: Family Medicine

## 2015-06-26 MED ORDER — PREGABALIN 200 MG PO CAPS: 200.0000 mg | ORAL_CAPSULE | Freq: Two times a day (BID) | ORAL | Status: DC

## 2015-06-26 NOTE — Telephone Encounter (Signed)
Lmovm to rtn my call. Need to discuss Lyrica dose change.

## 2015-06-26 NOTE — Telephone Encounter (Signed)
Patient returned my call. Explained to her that her insurance has a quantity limit for Lyrica 100 mg of 3 capsules per day or less. The Rx that was recently sent to her pharmacy would have her taking 100 mg 2 capsules twice a day, we will change Rx to 200 mg capsule 1 twice a day that keeps her in the quantity guidelines for her ins plan. Will send new Rx to her pharmacy.

## 2015-07-10 ENCOUNTER — Encounter (HOSPITAL_COMMUNITY): Payer: Self-pay | Admitting: Psychology

## 2015-07-10 ENCOUNTER — Ambulatory Visit (HOSPITAL_COMMUNITY): Payer: Self-pay | Admitting: Psychology

## 2015-07-10 NOTE — Progress Notes (Signed)
Nicole [name] is a 70 y.o. female patient who didn't show for her appointment.  Letter sent.        Jan Fireman, LPC

## 2015-07-24 ENCOUNTER — Encounter (HOSPITAL_COMMUNITY): Payer: Self-pay | Admitting: Psychology

## 2015-07-24 ENCOUNTER — Ambulatory Visit (HOSPITAL_COMMUNITY): Payer: Self-pay | Admitting: Psychology

## 2015-07-24 NOTE — Progress Notes (Signed)
Nicole [name] is a 70 y.o. female patient who didn't show for her appointment.  Letter sent.        Jan Fireman, LPC

## 2015-08-08 ENCOUNTER — Ambulatory Visit (HOSPITAL_COMMUNITY): Payer: Self-pay | Admitting: Psychology

## 2015-08-08 ENCOUNTER — Encounter (HOSPITAL_COMMUNITY): Payer: Self-pay | Admitting: Psychology

## 2015-08-08 NOTE — Progress Notes (Signed)
Nicole [name] is a 70 y.o. female patient who didn't show for appointment.  Letter sent.        Jan Fireman, LPC

## 2015-11-22 ENCOUNTER — Ambulatory Visit (INDEPENDENT_AMBULATORY_CARE_PROVIDER_SITE_OTHER): Payer: Medicare Other | Admitting: Neurology

## 2015-11-22 ENCOUNTER — Encounter: Payer: Self-pay | Admitting: Neurology

## 2015-11-22 VITALS — BP 142/80 | HR 86 | Temp 98.4°F | Ht 62.0 in | Wt 184.1 lb

## 2015-11-22 DIAGNOSIS — R51 Headache: Secondary | ICD-10-CM | POA: Diagnosis not present

## 2015-11-22 DIAGNOSIS — R519 Headache, unspecified: Secondary | ICD-10-CM

## 2015-11-22 MED ORDER — PREGABALIN 200 MG PO CAPS: 200.0000 mg | ORAL_CAPSULE | Freq: Two times a day (BID) | ORAL | 5 refills | Status: DC

## 2015-11-22 NOTE — Patient Instructions (Signed)
1. Continue Lyrica 200mg  twice a day 2. Keep a calendar of your headaches 3. Follow-up in 6 months, call for any changes

## 2015-11-22 NOTE — Progress Notes (Signed)
NEUROLOGY FOLLOW UP OFFICE NOTE  KEIMARI POINTER DU:[id number]  HISTORY OF PRESENT ILLNESS: I had the pleasure of seeing Nicole [name] in follow-up in the neurology clinic on 11/22/2015.  The patient was last seen 5 months ago for memory loss and worsening migraines. She had continued daily headaches on gabapentin, as well as body aches. She was switched to Lyrica, dose increased to 200mg  BID on her last visit. She reports that headaches are a little better, she is not having them daily, possibly every other day. No associated nausea/vomiting. She is sensitive to lights and sounds. She is also taking Cymbalta 30mg  daily, which is making her a "nicer person." She is feeling sore in her shoulders and legs today after doing household chores yesterday. She denies any side effects on her medications. She denies any falls. No difficulties with ADLs. She denies any vision changes, no diplopia, blurred vision, dysarthria, dysphagia, bowel/bladder dysfunction, tremors.  HPI: [name] with a history of anxiety, fibromyalgia, chronic back pain, and migraines, who presented for memory loss and chronic daily headaches. She is unsure if the memory changes are due to the headaches. She reports that headaches started in her 87s, she has "always had headaches and just lived with them," occurring once every 1-2 weeks. Over the past few years, headaches have increased in frequency now occurring daily, at times waking her up in the middle of the night. She has a constant bad around her head that she describes as "stress headaches." In addition, she has more severe migraines occurring 2-3 times a week, lasting 2-3 days, with sharp pain on the left temple, going down her left eye where she feels her left eye droops down, followed by stiffness on the left side of her neck. These would be associated with nausea, photo and phonophobia where she needs to go to bed and needs 2-3 days to recover. She denies any tearing  but has noticed her left eye would get red. She also has sinus headaches, but can feel the difference from her other headaches. No clear triggers to the headaches. She had previously seen neurologist Dr. Erling Cruz and had several tests, diagnosed with "unexplained headaches." She recalls taking Triavil (combination amitriptyline and perphenazine) which "took them away totally" however this was taken off the market. She has been on Zoloft for many years. She has occasional lightheadedness with the headaches, as well as panic attacks, and would take prn ativan. Her grandmother had severe headaches, a brother had cluster headaches. She does not recall trying any triptans. She has been taking Percocet for the headaches, and reports that she does not take more than 1 or 2 a day.  She has noticed memory changes over the past year where she would get confused. She would call her daughter and ask her what the date is. When she pays her bills, she "goes into a fog" and pushes them back "like they are not there." She knows there are bills to pay but "just does not have the energy" to do it. She denies getting lost driving or leaving the stove on. She lives alone and denies any difficulties with ADLs. She almost never forgets to take her medications, using a pillbox. She has slight word-finding difficulties. She loses her bifocals all the time. She is sleeping a lot, and would wake up 4 hours later wondering where the time went. Her grandmother had memory issues, a daughter also has memory changes that she attributes to fibromyalgia  and multiple medications.  She occasionally has anosmia, occasional neck and chronic back pain. She occasionally drinks alcohol but this makes her feel very hot. No head injuries.   Diagnostic Data: Head CT without contrast normal.  PAST MEDICAL HISTORY: Past Medical History:  Diagnosis Date  . Anemia   . Anxiety   . Asthma   . Back pain   . Bloating   . Blurred vision    occasional  .  Chest pain   . Colon polyps   . Depression   . Diabetes mellitus    prediabetic  . Dizziness   . Endometriosis    history of  . Fatigue   . Fibromyalgia   . Forgetfulness   . GERD (gastroesophageal reflux disease)   . Headache    migraines  . Hypercholesterolemia   . Joint pain   . Leg weakness   . Obesity   . SOB (shortness of breath)   . Urinary incontinence   . Vaginitis     MEDICATIONS: Current Outpatient Prescriptions on File Prior to Visit  Medication Sig Dispense Refill  . aspirin 81 MG tablet Take 81 mg by mouth daily.    . Clobetasol Prop Emollient Base 0.05 % emollient cream Apply nightly to affected area. 30 g 0  . DULoxetine (CYMBALTA) 30 MG capsule Take 30 mg by mouth daily.    . ergocalciferol (VITAMIN D2) 50000 UNITS capsule Take 50,000 Units by mouth once a week.      . estradiol (ESTRACE) 0.1 MG/GM vaginal cream Place 1 Applicatorful vaginally at bedtime.    . fenofibrate 160 MG tablet Take 160 mg by mouth daily.    . Fluticasone-Salmeterol (ADVAIR DISKUS) 250-50 MCG/DOSE AEPB Inhale 1 puff into the lungs 2 (two) times daily.    Marland Kitchen LORazepam (ATIVAN) 1 MG tablet 1 mg 2 (two) times daily.     . meloxicam (MOBIC) 15 MG tablet Take 15 mg by mouth daily.    . NONFORMULARY OR COMPOUNDED ITEM Estradiol 0.02% vaginal cream prefilled applicators apply one twice weekly 90 each 3  . oxyCODONE-acetaminophen (PERCOCET) 10-325 MG per tablet Take 1 tablet by mouth 3 (three) times daily as needed.     . pantoprazole (PROTONIX) 40 MG tablet Take 1 tablet (40 mg total) by mouth 2 (two) times daily. 60 tablet 6  . polyethylene glycol powder (GLYCOLAX/MIRALAX) powder Take by mouth.    . pregabalin (LYRICA) 200 MG capsule Take 1 capsule (200 mg total) by mouth 2 (two) times daily. 60 capsule 5  . PROAIR HFA 108 (90 Base) MCG/ACT inhaler See admin instructions. Uses as rescue inhaler Reported on 06/20/2015  0  . simvastatin (ZOCOR) 40 MG tablet Take 40 mg by mouth at bedtime.         No current facility-administered medications on file prior to visit.     ALLERGIES: Allergies  Allergen Reactions  . Fish-Derived Products Anaphylaxis  . Shellfish Allergy Shortness Of Breath    In dye for scan.  . Aspirin Nausea Only    FAMILY HISTORY: Family History  Problem Relation Age of Onset  . Hypertension Mother   . Heart failure Mother   . Diabetes Mother   . Hypertension Father   . Heart failure Father   . Diabetes Father   . Heart attack Father   . Stroke Father   . Cancer Brother     lung  . Thyroid disease Brother   . Cancer Paternal Aunt     bone  .  Cancer Paternal Uncle     lung  . Hypertension Maternal Grandmother   . Hypertension Maternal Grandfather   . Hypertension Paternal Grandmother   . Hypertension Paternal Grandfather   . Cancer Brother     throat  . Diabetes Daughter   . Colon cancer Neg Hx   . Stomach cancer Neg Hx     SOCIAL HISTORY: Social History   Social History  . Marital status: Widowed    Spouse name: N/A  . Number of children: 1  . Years of education: N/A   Occupational History  . disability    Social History Main Topics  . Smoking status: Never Smoker  . Smokeless tobacco: Never Used  . Alcohol use 0.0 oz/week     Comment: occasional  . Drug use: No  . Sexual activity: Not Currently     Comment: 1st intercourse 70 yo-Fewer than 5 partners   Other Topics Concern  . Not on file   Social History Narrative  . No narrative on file    REVIEW OF SYSTEMS: Constitutional: No fevers, chills, or sweats, no generalized fatigue, change in appetite Eyes: No visual changes, double vision, eye pain Ear, nose and throat: No hearing loss, ear pain, nasal congestion, sore throat Cardiovascular: No chest pain, palpitations Respiratory:  No shortness of breath at rest or with exertion, wheezes GastrointestinaI: No nausea, vomiting, diarrhea, abdominal pain, fecal incontinence Genitourinary:  No dysuria, urinary retention  or frequency Musculoskeletal:  + neck pain, back pain Integumentary: No rash, pruritus, skin lesions Neurological: as above Psychiatric: No depression, insomnia, anxiety Endocrine: No palpitations, fatigue, diaphoresis, mood swings, change in appetite, change in weight, increased thirst Hematologic/Lymphatic:  No anemia, purpura, petechiae. Allergic/Immunologic: no itchy/runny eyes, nasal congestion, recent allergic reactions, rashes  PHYSICAL EXAM: Vitals:   11/22/15 1400  BP: (!) 142/80  Pulse: 86  Temp: 98.4 F (36.9 C)   General: No acute distress Head:  Normocephalic/atraumatic Neck: supple, no paraspinal tenderness, full range of motion Heart:  Regular rate and rhythm Lungs:  Clear to auscultation bilaterally Back: No paraspinal tenderness Skin/Extremities: No rash, no edema Neurological Exam: alert and oriented to person, place, and time. No aphasia or dysarthria. Fund of knowledge is appropriate.  Recent and remote memory are intact. 3/3 delayed recall.  Attention and concentration are normal.    Able to name objects and repeat phrases. Cranial nerves: Pupils equal, round, reactive to light.  Fundoscopic exam unremarkable, no papilledema. Extraocular movements intact with no nystagmus. Visual fields full. Facial sensation intact. No facial asymmetry. Tongue, uvula, palate midline.  Motor: Bulk and tone normal, muscle strength 5/5 throughout with no pronator drift.  Sensation to light touch intact.  No extinction to double simultaneous stimulation.  Deep tendon reflexes 2+ throughout, toes downgoing.  Finger to nose testing intact.  Gait narrow-based and steady, able to tandem walk adequately.  Romberg negative.  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 Head CT unremarkable. Her MMSE in June 2016 was normal 29/30. She had  initial good response to gabapentin, but then again started to report daily headaches. She is now on Lyrica 200mg  BID and feels headaches are a little better. She will continue on same dose for now and continue to monitor headaches. She continues to have body pains and will discuss increasing Cymbalta dose with her PCP. She is aware of medication overuse  headaches, she was advised to minimize intake of Percocet for headaches to 2-3 a week to avoid rebound headaches. She will follow-up in 6 months and knows to call for any changes.  Thank you for allowing me to participate in her care.  Please do not hesitate to call for any questions or concerns.  The duration of this appointment visit was 15 minutes of face-to-face time with the patient.  Greater than 50% of this time was spent in counseling, explanation of diagnosis, planning of further management, and coordination of care.   Ellouise Newer, M.D.   CC: Dr. Forde Dandy

## 2016-04-10 ENCOUNTER — Encounter (HOSPITAL_COMMUNITY): Payer: Self-pay | Admitting: Psychology

## 2016-04-10 NOTE — Progress Notes (Signed)
Nicole [name] is a 71 y.o. female patient who is discharged from counseling as never returned after initial assessment.  Outpatient Therapist Discharge Summary  Nicole [name]    [id number]   Admission Date: 06/08/15   Discharge Date:  04/10/16 Reason for Discharge:  Didn't return Diagnosis:  Bipolar 1 D/O  Comments:  Pt to return for services in community as needed.   Jenne Campus, LPC

## 2016-05-23 ENCOUNTER — Encounter: Payer: Medicare Other | Admitting: Gynecology

## 2016-05-23 DIAGNOSIS — Z0289 Encounter for other administrative examinations: Secondary | ICD-10-CM

## 2016-05-27 ENCOUNTER — Ambulatory Visit: Payer: Self-pay | Admitting: Neurology

## 2016-05-27 DIAGNOSIS — Z029 Encounter for administrative examinations, unspecified: Secondary | ICD-10-CM

## 2016-05-28 ENCOUNTER — Encounter: Payer: Self-pay | Admitting: Neurology

## 2016-06-09 ENCOUNTER — Other Ambulatory Visit: Payer: Self-pay | Admitting: Neurology

## 2016-07-10 ENCOUNTER — Encounter: Payer: Self-pay | Admitting: Neurology

## 2016-07-10 ENCOUNTER — Ambulatory Visit (INDEPENDENT_AMBULATORY_CARE_PROVIDER_SITE_OTHER): Payer: Medicare Other | Admitting: Neurology

## 2016-07-10 VITALS — BP 126/74 | HR 93 | Temp 98.1°F | Ht 62.0 in | Wt 184.0 lb

## 2016-07-10 DIAGNOSIS — R51 Headache: Secondary | ICD-10-CM

## 2016-07-10 DIAGNOSIS — M797 Fibromyalgia: Secondary | ICD-10-CM | POA: Diagnosis not present

## 2016-07-10 DIAGNOSIS — R519 Headache, unspecified: Secondary | ICD-10-CM

## 2016-07-10 MED ORDER — PREGABALIN 200 MG PO CAPS: 200.0000 mg | ORAL_CAPSULE | Freq: Two times a day (BID) | ORAL | 11 refills | Status: DC

## 2016-07-10 MED ORDER — DULOXETINE HCL 30 MG PO CPEP: ORAL_CAPSULE | ORAL | 11 refills | Status: DC

## 2016-07-10 NOTE — Progress Notes (Signed)
NEUROLOGY FOLLOW UP OFFICE NOTE  Nicole [name] [id number]  HISTORY OF PRESENT ILLNESS: I had the pleasure of seeing Nicole [name] in follow-up in the neurology clinic on 07/10/2016.  The patient was last seen 8 months ago for memory loss and worsening migraines. She had continued daily headaches on gabapentin, as well as body aches. She was switched to Lyrica, currently on 200mg  BID with no side effects. On her last visit she reported headaches were a little better. Today she reports that headaches are still the same, occurring on a daily basis. She reports pain is at a 7 or 8 over 10, with some tenderness on the left side. She is also having a lot worse aches and pains in her legs and feet. She takes Percocet 1-2 times almost daily for the various aches and pains.  A heating pad helps, she cannot stand for prolonged periods. She reports the top of her feet hurt. She has had 2 minor falls without injuries. She reports "a whole lot of anxiety." Her diabetes is poorly controlled. She feels her eye is "funny," no pain.   HPI: This is a 71 yo RH woman with a history of anxiety, fibromyalgia, chronic back pain, and migraines, who presented for memory loss and chronic daily headaches. She is unsure if the memory changes are due to the headaches. She reports that headaches started in her 23s, she has "always had headaches and just lived with them," occurring once every 1-2 weeks. Over the past few years, headaches have increased in frequency now occurring daily, at times waking her up in the middle of the night. She has a constant bad around her head that she describes as "stress headaches." In addition, she has more severe migraines occurring 2-3 times a week, lasting 2-3 days, with sharp pain on the left temple, going down her left eye where she feels her left eye droops down, followed by stiffness on the left side of her neck. These would be associated with nausea, photo and phonophobia where she needs to  go to bed and needs 2-3 days to recover. She denies any tearing but has noticed her left eye would get red. She also has sinus headaches, but can feel the difference from her other headaches. No clear triggers to the headaches. She had previously seen neurologist Dr. Erling Cruz and had several tests, diagnosed with "unexplained headaches." She recalls taking Triavil (combination amitriptyline and perphenazine) which "took them away totally" however this was taken off the market. She has been on Zoloft for many years. She has occasional lightheadedness with the headaches, as well as panic attacks, and would take prn ativan. Her grandmother had severe headaches, a brother had cluster headaches. She does not recall trying any triptans. She has been taking Percocet for the headaches, and reports that she does not take more than 1 or 2 a day.  She has noticed memory changes over the past year where she would get confused. She would call her daughter and ask her what the date is. When she pays her bills, she "goes into a fog" and pushes them back "like they are not there." She knows there are bills to pay but "just does not have the energy" to do it. She denies getting lost driving or leaving the stove on. She lives alone and denies any difficulties with ADLs. She almost never forgets to take her medications, using a pillbox. She has slight word-finding difficulties. She loses her bifocals all the time. She is  sleeping a lot, and would wake up 4 hours later wondering where the time went. Her grandmother had memory issues, a daughter also has memory changes that she attributes to fibromyalgia and multiple medications.  She occasionally has anosmia, occasional neck and chronic back pain. She occasionally drinks alcohol but this makes her feel very hot. No head injuries.   Diagnostic Data: Head CT without contrast normal.  PAST MEDICAL HISTORY: Past Medical History:  Diagnosis Date  . Anemia   . Anxiety   . Asthma   .  Back pain   . Bloating   . Blurred vision    occasional  . Chest pain   . Colon polyps   . Depression   . Diabetes mellitus    prediabetic  . Dizziness   . Endometriosis    history of  . Fatigue   . Fibromyalgia   . Forgetfulness   . GERD (gastroesophageal reflux disease)   . Headache    migraines  . Hypercholesterolemia   . Joint pain   . Leg weakness   . Obesity   . SOB (shortness of breath)   . Urinary incontinence   . Vaginitis     MEDICATIONS:  Current Outpatient Prescriptions on File Prior to Visit  Medication Sig Dispense Refill  . DULoxetine (CYMBALTA) 30 MG capsule Take 30 mg by mouth daily.    . ergocalciferol (VITAMIN D2) 50000 UNITS capsule Take 50,000 Units by mouth once a week.      . estradiol (ESTRACE) 0.1 MG/GM vaginal cream Place 1 Applicatorful vaginally at bedtime.    . fenofibrate 160 MG tablet Take 160 mg by mouth daily.    . Fluticasone-Salmeterol (ADVAIR DISKUS) 250-50 MCG/DOSE AEPB Inhale 1 puff into the lungs 2 (two) times daily.    Marland Kitchen LORazepam (ATIVAN) 1 MG tablet 1 mg 2 (two) times daily.     Marland Kitchen LYRICA 200 MG capsule take 1 capsule by mouth twice a day 60 capsule 3  . NONFORMULARY OR COMPOUNDED ITEM Estradiol 0.02% vaginal cream prefilled applicators apply one twice weekly 90 each 3  . oxyCODONE-acetaminophen (PERCOCET) 10-325 MG per tablet Take 1 tablet by mouth 3 (three) times daily as needed.     . pantoprazole (PROTONIX) 40 MG tablet Take 1 tablet (40 mg total) by mouth 2 (two) times daily. 60 tablet 6  . polyethylene glycol powder (GLYCOLAX/MIRALAX) powder Take by mouth.    Marland Kitchen PROAIR HFA 108 (90 Base) MCG/ACT inhaler See admin instructions. Uses as rescue inhaler Reported on 06/20/2015  0  . aspirin 81 MG tablet Take 81 mg by mouth daily.    . meloxicam (MOBIC) 15 MG tablet Take 15 mg by mouth daily.    . simvastatin (ZOCOR) 40 MG tablet Take 40 mg by mouth at bedtime.       No current facility-administered medications on file prior to  visit.     ALLERGIES: Allergies  Allergen Reactions  . Fish-Derived Products Anaphylaxis  . Shellfish Allergy Shortness Of Breath    In dye for scan.  . Aspirin Nausea Only    FAMILY HISTORY: Family History  Problem Relation Age of Onset  . Hypertension Mother   . Heart failure Mother   . Diabetes Mother   . Hypertension Father   . Heart failure Father   . Diabetes Father   . Heart attack Father   . Stroke Father   . Cancer Brother     lung  . Thyroid disease Brother   . Cancer  Brother     throat  . Cancer Paternal Aunt     bone  . Cancer Paternal Uncle     lung  . Hypertension Maternal Grandmother   . Hypertension Maternal Grandfather   . Hypertension Paternal Grandmother   . Hypertension Paternal Grandfather   . Diabetes Daughter   . Colon cancer Neg Hx   . Stomach cancer Neg Hx     SOCIAL HISTORY: Social History   Social History  . Marital status: Widowed    Spouse name: N/A  . Number of children: 1  . Years of education: N/A   Occupational History  . disability    Social History Main Topics  . Smoking status: Never Smoker  . Smokeless tobacco: Never Used  . Alcohol use 0.0 oz/week     Comment: occasional  . Drug use: No  . Sexual activity: Not Currently     Comment: 1st intercourse 71 yo-Fewer than 5 partners   Other Topics Concern  . Not on file   Social History Narrative  . No narrative on file    REVIEW OF SYSTEMS: Constitutional: No fevers, chills, or sweats, no generalized fatigue, change in appetite Eyes: No visual changes, double vision, eye pain Ear, nose and throat: No hearing loss, ear pain, nasal congestion, sore throat Cardiovascular: No chest pain, palpitations Respiratory:  No shortness of breath at rest or with exertion, wheezes GastrointestinaI: No nausea, vomiting, diarrhea, abdominal pain, fecal incontinence Genitourinary:  No dysuria, urinary retention or frequency Musculoskeletal:  + neck pain, back  pain Integumentary: No rash, pruritus, skin lesions Neurological: as above Psychiatric: No depression, insomnia, anxiety Endocrine: No palpitations, fatigue, diaphoresis, mood swings, change in appetite, change in weight, increased thirst Hematologic/Lymphatic:  No anemia, purpura, petechiae. Allergic/Immunologic: no itchy/runny eyes, nasal congestion, recent allergic reactions, rashes  PHYSICAL EXAM: Vitals:   07/10/16 1319  BP: 126/74  Pulse: 93  Temp: 98.1 F (36.7 C)   General: No acute distress Head:  Normocephalic/atraumatic Neck: supple, no paraspinal tenderness, full range of motion Heart:  Regular rate and rhythm Lungs:  Clear to auscultation bilaterally Back: No paraspinal tenderness Skin/Extremities: No rash, no edema Neurological Exam: alert and oriented to person, place, and time. No aphasia or dysarthria. Fund of knowledge is appropriate.  Recent and remote memory are intact. 3/3 delayed recall.  Attention and concentration are normal.    Able to name objects and repeat phrases. Cranial nerves: Pupils equal, round, reactive to light.  Fundoscopic exam unremarkable, no papilledema. Extraocular movements intact with no nystagmus. Visual fields full. Facial sensation intact. No facial asymmetry. Tongue, uvula, palate midline.  Motor: Bulk and tone normal, muscle strength 5/5 throughout with no pronator drift.  Sensation to light touch intact.  No extinction to double simultaneous stimulation.  Deep tendon reflexes 2+ throughout, toes downgoing.  Finger to nose testing intact.  Gait narrow-based and steady, able to tandem walk adequately.  Romberg negative.  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 Head CT unremarkable. She had initial good response to gabapentin, but then again started to report daily headaches. She is  now on Lyrica 200mg  BID and again reports headaches occur on a daily basis. 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 We discussed minimizing Percocet intake as much as she can to 2-3 times a week. She will increase  Cymbalta to 60mg  daily. She will follow-up in 6 months and knows to call for any changes.  Thank you for allowing me to participate in her care.  Please do not hesitate to call for any questions or concerns.  The duration of this appointment visit was 25 minutes of face-to-face time with the patient.  Greater than 50% of this time was spent in counseling, explanation of diagnosis, planning of further management, and coordination of care.   Ellouise Newer, M.D.   CC: Dr. Forde Dandy

## 2016-07-10 NOTE — Patient Instructions (Addendum)
1. Increase Cymbalta 30mg  to 2 capsules daily 2. Continue Lyrica 200mg  twice a day 3. Minimize Percocet use to 2-3 times a week, taking too much can actually make headaches worse 4. Follow-up in 6 months, call for any changes

## 2017-01-09 ENCOUNTER — Ambulatory Visit: Payer: Self-pay | Admitting: Neurology

## 2017-01-13 ENCOUNTER — Other Ambulatory Visit: Payer: Self-pay | Admitting: Neurology

## 2017-01-13 DIAGNOSIS — R51 Headache: Principal | ICD-10-CM

## 2017-01-13 DIAGNOSIS — R519 Headache, unspecified: Secondary | ICD-10-CM

## 2017-07-15 ENCOUNTER — Ambulatory Visit: Payer: Self-pay | Admitting: Neurology

## 2017-07-18 ENCOUNTER — Other Ambulatory Visit: Payer: Self-pay

## 2017-07-18 ENCOUNTER — Ambulatory Visit (INDEPENDENT_AMBULATORY_CARE_PROVIDER_SITE_OTHER): Payer: Medicare Other | Admitting: Neurology

## 2017-07-18 ENCOUNTER — Encounter: Payer: Self-pay | Admitting: Neurology

## 2017-07-18 VITALS — BP 146/80 | HR 85 | Ht 62.0 in | Wt 176.0 lb

## 2017-07-18 DIAGNOSIS — R51 Headache: Secondary | ICD-10-CM

## 2017-07-18 DIAGNOSIS — R519 Headache, unspecified: Secondary | ICD-10-CM

## 2017-07-18 MED ORDER — PREGABALIN 100 MG PO CAPS: ORAL_CAPSULE | ORAL | 0 refills | Status: DC

## 2017-07-18 NOTE — Patient Instructions (Signed)
Start weaning off Lyrica to 100mg  take 1 cap twice a day for 2 weeks, then 1 cap daily for 2 weeks, then stop. After a week off Lyrica, call our office and we will send in prescription for amitriptyline. We can adjust dose in the future if needed. Call for any changes. Follow-up in 6 months.
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
## 2017-07-28 ENCOUNTER — Encounter: Payer: Self-pay | Admitting: Neurology

## 2017-09-01 ENCOUNTER — Other Ambulatory Visit: Payer: Self-pay

## 2018-02-20 ENCOUNTER — Ambulatory Visit: Payer: Self-pay | Admitting: Neurology

## 2018-12-16 ENCOUNTER — Encounter: Payer: Self-pay | Admitting: Gynecology

## 2019-06-21 ENCOUNTER — Other Ambulatory Visit: Payer: Self-pay | Admitting: Adult Health

## 2019-06-21 MED ORDER — SODIUM CHLORIDE 0.9 % IV SOLN
Freq: Once | INTRAVENOUS | Status: AC
  Filled 2019-06-21: qty 700

## 2019-06-21 NOTE — Progress Notes (Signed)
  I connected by phone with Byrd Hesselbach on 06/21/2019 at 5:16 PM to discuss the potential use of an new treatment for mild to moderate COVID-19 viral infection in non-hospitalized patients.  This patient is a 74 y.o. female that meets the FDA criteria for Emergency Use Authorization of bamlanivimab/etesevimab or casirivimab/imdevimab.  Has a (+) direct SARS-CoV-2 viral test result  Has mild or moderate COVID-19   Is ? 72 years of age and weighs ? 40 kg  Is NOT hospitalized due to COVID-19  Is NOT requiring oxygen therapy or requiring an increase in baseline oxygen flow rate due to COVID-19  Is within 10 days of symptom onset  Has at least one of the high risk factor(s) for progression to severe COVID-19 and/or hospitalization as defined in EUA.  Specific high risk criteria : >/= 72 yo   I have spoken and communicated the following to the patient or parent/caregiver:  1. FDA has authorized the emergency use of bamlanivimab/etesevimab and casirivimab\imdevimab for the treatment of mild to moderate COVID-19 in adults and pediatric patients with positive results of direct SARS-CoV-2 viral testing who are 61 years of age and older weighing at least 40 kg, and who are at high risk for progressing to severe COVID-19 and/or hospitalization.  2. The significant known and potential risks and benefits of bamlanivimab/etesevimab and casirivimab\imdevimab, and the extent to which such potential risks and benefits are unknown.  3. Information on available alternative treatments and the risks and benefits of those alternatives, including clinical trials.  4. Patients treated with bamlanivimab/etesevimab and casirivimab\imdevimab should continue to self-isolate and use infection control measures (e.g., wear mask, isolate, social distance, avoid sharing personal items, clean and disinfect "high touch" surfaces, and frequent handwashing) according to CDC guidelines.   5. The patient or  parent/caregiver has the option to accept or refuse bamlanivimab/etesevimab or casirivimab\imdevimab .  After reviewing this information with the patient, The patient agreed to proceed with receiving the bamlanimivab infusion and will be provided a copy of the Fact sheet prior to receiving the infusion..  Scheduled for 06/22/19 at 1230. Will bring copy of test results.   Shanekqua Schaper 06/21/2019 5:16 PM

## 2019-06-22 ENCOUNTER — Ambulatory Visit (HOSPITAL_COMMUNITY)
Admission: RE | Admit: 2019-06-22 | Discharge: 2019-06-22 | Disposition: A | Discharge status: Home / Self Care | Payer: Medicare Other | Source: Ambulatory Visit | Attending: Pulmonary Disease | Admitting: Pulmonary Disease

## 2019-06-22 DIAGNOSIS — U071 COVID-19: Secondary | ICD-10-CM | POA: Diagnosis not present

## 2019-06-22 DIAGNOSIS — Z23 Encounter for immunization: Secondary | ICD-10-CM | POA: Diagnosis not present

## 2019-06-22 MED ORDER — FAMOTIDINE IN NACL 20-0.9 MG/50ML-% IV SOLN: 20.0000 mg | Freq: Once | INTRAVENOUS | Status: DC | PRN

## 2019-06-22 MED ORDER — ALBUTEROL SULFATE HFA 108 (90 BASE) MCG/ACT IN AERS: 2.0000 | INHALATION_SPRAY | Freq: Once | RESPIRATORY_TRACT | Status: DC | PRN

## 2019-06-22 MED ORDER — EPINEPHRINE 0.3 MG/0.3ML IJ SOAJ: 0.3000 mg | Freq: Once | INTRAMUSCULAR | Status: DC | PRN

## 2019-06-22 MED ORDER — METHYLPREDNISOLONE SODIUM SUCC 125 MG IJ SOLR: 125.0000 mg | Freq: Once | INTRAMUSCULAR | Status: DC | PRN

## 2019-06-22 MED ORDER — SODIUM CHLORIDE 0.9 % IV SOLN: INTRAVENOUS | Status: DC | PRN

## 2019-06-22 MED ORDER — DIPHENHYDRAMINE HCL 50 MG/ML IJ SOLN: 50.0000 mg | Freq: Once | INTRAMUSCULAR | Status: DC | PRN

## 2019-06-22 NOTE — Discharge Instructions (Signed)

## 2019-06-22 NOTE — Progress Notes (Signed)
  Diagnosis: COVID-19  Physician:Dr Joya Gaskins  Procedure: [facility] Med: bamlanivimab\etesevimab infusion - Provided patient with bamlanimivab\etesevimab fact sheet for patients, parents and caregivers prior to infusion.  Complications: No immediate complications noted.  Discharge: Discharged home   [place], [place] 06/22/2019

## 2019-06-23 ENCOUNTER — Other Ambulatory Visit (HOSPITAL_COMMUNITY): Payer: Self-pay

## 2019-06-24 ENCOUNTER — Encounter (HOSPITAL_COMMUNITY): Payer: Self-pay

## 2019-06-24 ENCOUNTER — Emergency Department (HOSPITAL_COMMUNITY): Payer: Medicare Other

## 2019-06-24 ENCOUNTER — Inpatient Hospital Stay (HOSPITAL_COMMUNITY)
Admission: EM | Admit: 2019-06-24 | Discharge: 2019-06-28 | DRG: 177 | Disposition: A | Discharge status: Home / Self Care | Payer: Medicare Other | Attending: Internal Medicine | Admitting: Internal Medicine

## 2019-06-24 ENCOUNTER — Other Ambulatory Visit: Payer: Self-pay

## 2019-06-24 DIAGNOSIS — J1282 Pneumonia due to coronavirus disease 2019: Secondary | ICD-10-CM | POA: Diagnosis present

## 2019-06-24 DIAGNOSIS — G43909 Migraine, unspecified, not intractable, without status migrainosus: Secondary | ICD-10-CM | POA: Diagnosis present

## 2019-06-24 DIAGNOSIS — E860 Dehydration: Secondary | ICD-10-CM | POA: Diagnosis present

## 2019-06-24 DIAGNOSIS — Z86010 Personal history of colonic polyps: Secondary | ICD-10-CM

## 2019-06-24 DIAGNOSIS — Z91041 Radiographic dye allergy status: Secondary | ICD-10-CM | POA: Diagnosis not present

## 2019-06-24 DIAGNOSIS — Z833 Family history of diabetes mellitus: Secondary | ICD-10-CM

## 2019-06-24 DIAGNOSIS — Z789 Other specified health status: Secondary | ICD-10-CM | POA: Diagnosis not present

## 2019-06-24 DIAGNOSIS — K2100 Gastro-esophageal reflux disease with esophagitis, without bleeding: Secondary | ICD-10-CM | POA: Diagnosis present

## 2019-06-24 DIAGNOSIS — F329 Major depressive disorder, single episode, unspecified: Secondary | ICD-10-CM | POA: Diagnosis present

## 2019-06-24 DIAGNOSIS — Z91013 Allergy to seafood: Secondary | ICD-10-CM

## 2019-06-24 DIAGNOSIS — U071 COVID-19: Principal | ICD-10-CM | POA: Diagnosis present

## 2019-06-24 DIAGNOSIS — Z7982 Long term (current) use of aspirin: Secondary | ICD-10-CM

## 2019-06-24 DIAGNOSIS — Z7951 Long term (current) use of inhaled steroids: Secondary | ICD-10-CM

## 2019-06-24 DIAGNOSIS — R0602 Shortness of breath: Secondary | ICD-10-CM | POA: Diagnosis present

## 2019-06-24 DIAGNOSIS — Z79899 Other long term (current) drug therapy: Secondary | ICD-10-CM

## 2019-06-24 DIAGNOSIS — E876 Hypokalemia: Secondary | ICD-10-CM | POA: Diagnosis present

## 2019-06-24 DIAGNOSIS — E119 Type 2 diabetes mellitus without complications: Secondary | ICD-10-CM | POA: Diagnosis not present

## 2019-06-24 DIAGNOSIS — G9341 Metabolic encephalopathy: Secondary | ICD-10-CM | POA: Diagnosis not present

## 2019-06-24 DIAGNOSIS — E782 Mixed hyperlipidemia: Secondary | ICD-10-CM | POA: Diagnosis not present

## 2019-06-24 DIAGNOSIS — E785 Hyperlipidemia, unspecified: Secondary | ICD-10-CM | POA: Diagnosis present

## 2019-06-24 DIAGNOSIS — J4521 Mild intermittent asthma with (acute) exacerbation: Secondary | ICD-10-CM | POA: Diagnosis not present

## 2019-06-24 DIAGNOSIS — J9601 Acute respiratory failure with hypoxia: Secondary | ICD-10-CM | POA: Diagnosis present

## 2019-06-24 DIAGNOSIS — M797 Fibromyalgia: Secondary | ICD-10-CM | POA: Diagnosis present

## 2019-06-24 DIAGNOSIS — F419 Anxiety disorder, unspecified: Secondary | ICD-10-CM | POA: Diagnosis present

## 2019-06-24 DIAGNOSIS — Z886 Allergy status to analgesic agent status: Secondary | ICD-10-CM | POA: Diagnosis not present

## 2019-06-24 DIAGNOSIS — Z90710 Acquired absence of both cervix and uterus: Secondary | ICD-10-CM | POA: Diagnosis not present

## 2019-06-24 DIAGNOSIS — E1169 Type 2 diabetes mellitus with other specified complication: Secondary | ICD-10-CM | POA: Diagnosis not present

## 2019-06-24 DIAGNOSIS — R0902 Hypoxemia: Secondary | ICD-10-CM

## 2019-06-24 HISTORY — DX: Type 2 diabetes mellitus with other specified complication: E11.69

## 2019-06-24 HISTORY — DX: Gastro-esophageal reflux disease with esophagitis, without bleeding: K21.00

## 2019-06-24 HISTORY — DX: Major depressive disorder, single episode, unspecified: F32.9

## 2019-06-24 LAB — HEPATIC FUNCTION PANEL
ALT: 27 U/L (ref 0–44)
AST: 34 U/L (ref 15–41)
Albumin: 3.8 g/dL (ref 3.5–5.0)
Alkaline Phosphatase: 48 U/L (ref 38–126)
Bilirubin, Direct: 0.1 mg/dL (ref 0.0–0.2)
Indirect Bilirubin: 0.7 mg/dL (ref 0.3–0.9)
Total Bilirubin: 0.8 mg/dL (ref 0.3–1.2)
Total Protein: 7.7 g/dL (ref 6.5–8.1)

## 2019-06-24 LAB — BASIC METABOLIC PANEL
Anion gap: 14 (ref 5–15)
BUN: 19 mg/dL (ref 8–23)
CO2: 26 mmol/L (ref 22–32)
Calcium: 8.9 mg/dL (ref 8.9–10.3)
Chloride: 96 mmol/L — ABNORMAL LOW (ref 98–111)
Creatinine, Ser: 1.15 mg/dL — ABNORMAL HIGH (ref 0.44–1.00)
GFR calc Af Amer: 55 mL/min — ABNORMAL LOW (ref >60)
GFR calc non Af Amer: 48 mL/min — ABNORMAL LOW (ref >60)
Glucose, Bld: 183 mg/dL — ABNORMAL HIGH (ref 70–99)
Potassium: 2.6 mmol/L — CL (ref 3.5–5.1)
Sodium: 136 mmol/L (ref 135–145)

## 2019-06-24 LAB — CBC
HCT: 42.2 % (ref 36.0–46.0)
Hemoglobin: 14.7 g/dL (ref 12.0–15.0)
MCH: 31.2 pg (ref 26.0–34.0)
MCHC: 34.8 g/dL (ref 30.0–36.0)
MCV: 89.6 fL (ref 80.0–100.0)
Platelets: 192 10*3/uL (ref 150–400)
RBC: 4.71 MIL/uL (ref 3.87–5.11)
RDW: 12.5 % (ref 11.5–15.5)
WBC: 8 10*3/uL (ref 4.0–10.5)
nRBC: 0 % (ref 0.0–0.2)

## 2019-06-24 LAB — POCT I-STAT 7, (LYTES, BLD GAS, ICA,H+H)
Bicarbonate: 22 mmol/L (ref 20.0–28.0)
Calcium, Ion: 1.13 mmol/L — ABNORMAL LOW (ref 1.15–1.40)
HCT: 35 % — ABNORMAL LOW (ref 36.0–46.0)
Hemoglobin: 11.9 g/dL — ABNORMAL LOW (ref 12.0–15.0)
O2 Saturation: 97 %
Patient temperature: 98.6
Potassium: 3.1 mmol/L — ABNORMAL LOW (ref 3.5–5.1)
Sodium: 134 mmol/L — ABNORMAL LOW (ref 135–145)
TCO2: 23 mmol/L (ref 22–32)
pCO2 arterial: 28.9 mmHg — ABNORMAL LOW (ref 32.0–48.0)
pH, Arterial: 7.49 — ABNORMAL HIGH (ref 7.350–7.450)
pO2, Arterial: 86 mmHg (ref 83.0–108.0)

## 2019-06-24 LAB — RENAL FUNCTION PANEL
Albumin: 3.3 g/dL — ABNORMAL LOW (ref 3.5–5.0)
Anion gap: 15 (ref 5–15)
BUN: 18 mg/dL (ref 8–23)
CO2: 22 mmol/L (ref 22–32)
Calcium: 8.7 mg/dL — ABNORMAL LOW (ref 8.9–10.3)
Chloride: 97 mmol/L — ABNORMAL LOW (ref 98–111)
Creatinine, Ser: 1.04 mg/dL — ABNORMAL HIGH (ref 0.44–1.00)
GFR calc Af Amer: >60 mL/min (ref >60)
GFR calc non Af Amer: 54 mL/min — ABNORMAL LOW (ref >60)
Glucose, Bld: 221 mg/dL — ABNORMAL HIGH (ref 70–99)
Phosphorus: 2.7 mg/dL (ref 2.5–4.6)
Potassium: 3.2 mmol/L — ABNORMAL LOW (ref 3.5–5.1)
Sodium: 134 mmol/L — ABNORMAL LOW (ref 135–145)

## 2019-06-24 LAB — TROPONIN I (HIGH SENSITIVITY)
Troponin I (High Sensitivity): 15 ng/L (ref <18)
Troponin I (High Sensitivity): 15 ng/L (ref <18)

## 2019-06-24 LAB — ABO/RH: ABO/RH(D): O POS

## 2019-06-24 LAB — PROTIME-INR
INR: 0.9 (ref 0.8–1.2)
Prothrombin Time: 12.4 seconds (ref 11.4–15.2)

## 2019-06-24 LAB — GLUCOSE, CAPILLARY: Glucose-Capillary: 186 mg/dL — ABNORMAL HIGH (ref 70–99)

## 2019-06-24 LAB — TSH: TSH: 0.716 u[IU]/mL (ref 0.350–4.500)

## 2019-06-24 LAB — MAGNESIUM: Magnesium: 0.6 mg/dL — CL (ref 1.7–2.4)

## 2019-06-24 LAB — D-DIMER, QUANTITATIVE: D-Dimer, Quant: 0.55 ug/mL-FEU — ABNORMAL HIGH (ref 0.00–0.50)

## 2019-06-24 MED ORDER — POLYETHYLENE GLYCOL 3350 17 G PO PACK: 17.0000 g | PACK | Freq: Every day | ORAL | Status: DC | PRN

## 2019-06-24 MED ORDER — SODIUM CHLORIDE 0.9 % IV SOLN
100.0000 mg | Freq: Every day | INTRAVENOUS | Status: AC
  Administered 2019-06-25 – 2019-06-28 (×4): 100 mg via INTRAVENOUS
  Filled 2019-06-24 (×4): qty 20

## 2019-06-24 MED ORDER — DULOXETINE HCL 30 MG PO CPEP
30.0000 mg | ORAL_CAPSULE | Freq: Every day | ORAL | Status: DC
  Administered 2019-06-25 – 2019-06-28 (×4): 30 mg via ORAL
  Filled 2019-06-24 (×4): qty 1

## 2019-06-24 MED ORDER — LORAZEPAM 1 MG PO TABS
1.0000 mg | ORAL_TABLET | Freq: Two times a day (BID) | ORAL | Status: DC
  Administered 2019-06-25 – 2019-06-28 (×8): 1 mg via ORAL
  Filled 2019-06-24 (×9): qty 1

## 2019-06-24 MED ORDER — DEXAMETHASONE SODIUM PHOSPHATE 10 MG/ML IJ SOLN
6.0000 mg | Freq: Once | INTRAMUSCULAR | Status: AC
  Administered 2019-06-24: 6 mg via INTRAVENOUS
  Filled 2019-06-24: qty 1

## 2019-06-24 MED ORDER — LACTATED RINGERS IV SOLN: INTRAVENOUS | Status: AC

## 2019-06-24 MED ORDER — ALBUTEROL SULFATE HFA 108 (90 BASE) MCG/ACT IN AERS
4.0000 | INHALATION_SPRAY | Freq: Once | RESPIRATORY_TRACT | Status: AC
  Administered 2019-06-24: 17:00:00 4 via RESPIRATORY_TRACT
  Filled 2019-06-24: qty 6.7

## 2019-06-24 MED ORDER — MAGNESIUM SULFATE IN D5W 1-5 GM/100ML-% IV SOLN
1.0000 g | Freq: Once | INTRAVENOUS | Status: DC
  Filled 2019-06-24: qty 100

## 2019-06-24 MED ORDER — OXYCODONE-ACETAMINOPHEN 5-325 MG PO TABS
1.0000 | ORAL_TABLET | Freq: Four times a day (QID) | ORAL | Status: DC | PRN
  Administered 2019-06-25 – 2019-06-26 (×3): 1 via ORAL
  Filled 2019-06-24 (×4): qty 1

## 2019-06-24 MED ORDER — ALBUTEROL SULFATE HFA 108 (90 BASE) MCG/ACT IN AERS
2.0000 | INHALATION_SPRAY | Freq: Four times a day (QID) | RESPIRATORY_TRACT | Status: DC
  Administered 2019-06-24 – 2019-06-25 (×3): 2 via RESPIRATORY_TRACT
  Filled 2019-06-24: qty 6.7

## 2019-06-24 MED ORDER — INSULIN ASPART 100 UNIT/ML ~~LOC~~ SOLN
0.0000 [IU] | Freq: Three times a day (TID) | SUBCUTANEOUS | Status: DC
  Administered 2019-06-25: 21:00:00 3 [IU] via SUBCUTANEOUS
  Administered 2019-06-25: 5 [IU] via SUBCUTANEOUS
  Administered 2019-06-25 – 2019-06-26 (×3): 3 [IU] via SUBCUTANEOUS
  Administered 2019-06-26: 17:00:00 5 [IU] via SUBCUTANEOUS
  Administered 2019-06-26 – 2019-06-27 (×2): 2 [IU] via SUBCUTANEOUS
  Administered 2019-06-27: 3 [IU] via SUBCUTANEOUS
  Administered 2019-06-27: 2 [IU] via SUBCUTANEOUS
  Administered 2019-06-27: 3 [IU] via SUBCUTANEOUS
  Administered 2019-06-28: 08:00:00 2 [IU] via SUBCUTANEOUS

## 2019-06-24 MED ORDER — ONDANSETRON HCL 4 MG PO TABS: 4.0000 mg | ORAL_TABLET | Freq: Four times a day (QID) | ORAL | Status: DC | PRN

## 2019-06-24 MED ORDER — ALBUTEROL SULFATE HFA 108 (90 BASE) MCG/ACT IN AERS
2.0000 | INHALATION_SPRAY | RESPIRATORY_TRACT | Status: DC | PRN
  Filled 2019-06-24: qty 6.7

## 2019-06-24 MED ORDER — LACTATED RINGERS IV BOLUS
500.0000 mL | Freq: Once | INTRAVENOUS | Status: AC
  Administered 2019-06-24: 500 mL via INTRAVENOUS

## 2019-06-24 MED ORDER — POTASSIUM CHLORIDE 10 MEQ/100ML IV SOLN
10.0000 meq | Freq: Once | INTRAVENOUS | Status: AC
  Administered 2019-06-24: 19:00:00 10 meq via INTRAVENOUS
  Filled 2019-06-24: qty 100

## 2019-06-24 MED ORDER — ALBUTEROL SULFATE HFA 108 (90 BASE) MCG/ACT IN AERS
2.0000 | INHALATION_SPRAY | Freq: Four times a day (QID) | RESPIRATORY_TRACT | Status: DC
  Filled 2019-06-24: qty 6.7

## 2019-06-24 MED ORDER — ACETAMINOPHEN 325 MG PO TABS: 650.0000 mg | ORAL_TABLET | Freq: Four times a day (QID) | ORAL | Status: DC | PRN

## 2019-06-24 MED ORDER — ONDANSETRON HCL 4 MG/2ML IJ SOLN
4.0000 mg | Freq: Once | INTRAMUSCULAR | Status: AC
  Administered 2019-06-24: 4 mg via INTRAVENOUS
  Filled 2019-06-24: qty 2

## 2019-06-24 MED ORDER — IPRATROPIUM BROMIDE HFA 17 MCG/ACT IN AERS
2.0000 | INHALATION_SPRAY | Freq: Once | RESPIRATORY_TRACT | Status: AC
  Administered 2019-06-24: 17:00:00 2 via RESPIRATORY_TRACT
  Filled 2019-06-24: qty 12.9

## 2019-06-24 MED ORDER — GUAIFENESIN-DM 100-10 MG/5ML PO SYRP
10.0000 mL | ORAL_SOLUTION | ORAL | Status: DC | PRN
  Administered 2019-06-25: 15:00:00 10 mL via ORAL
  Filled 2019-06-24: qty 10

## 2019-06-24 MED ORDER — ALBUTEROL SULFATE HFA 108 (90 BASE) MCG/ACT IN AERS
2.0000 | INHALATION_SPRAY | RESPIRATORY_TRACT | Status: DC | PRN
  Administered 2019-06-26 – 2019-06-27 (×2): 2 via RESPIRATORY_TRACT
  Filled 2019-06-24: qty 6.7

## 2019-06-24 MED ORDER — POTASSIUM CHLORIDE CRYS ER 20 MEQ PO TBCR
40.0000 meq | EXTENDED_RELEASE_TABLET | Freq: Once | ORAL | Status: AC
  Administered 2019-06-25: 40 meq via ORAL
  Filled 2019-06-24: qty 2

## 2019-06-24 MED ORDER — ENOXAPARIN SODIUM 40 MG/0.4ML ~~LOC~~ SOLN
40.0000 mg | SUBCUTANEOUS | Status: DC
  Administered 2019-06-25 – 2019-06-27 (×4): 40 mg via SUBCUTANEOUS
  Filled 2019-06-24 (×4): qty 0.4

## 2019-06-24 MED ORDER — FENOFIBRATE 160 MG PO TABS
160.0000 mg | ORAL_TABLET | Freq: Every day | ORAL | Status: DC
  Administered 2019-06-25 – 2019-06-28 (×4): 160 mg via ORAL
  Filled 2019-06-24 (×4): qty 1

## 2019-06-24 MED ORDER — POTASSIUM CHLORIDE CRYS ER 20 MEQ PO TBCR
40.0000 meq | EXTENDED_RELEASE_TABLET | Freq: Once | ORAL | Status: AC
  Administered 2019-06-24: 17:00:00 40 meq via ORAL
  Filled 2019-06-24: qty 2

## 2019-06-24 MED ORDER — HYDROCOD POLST-CPM POLST ER 10-8 MG/5ML PO SUER
5.0000 mL | Freq: Two times a day (BID) | ORAL | Status: DC | PRN
  Administered 2019-06-25 – 2019-06-27 (×3): 5 mL via ORAL
  Filled 2019-06-24 (×3): qty 5

## 2019-06-24 MED ORDER — DEXAMETHASONE SODIUM PHOSPHATE 10 MG/ML IJ SOLN
6.0000 mg | INTRAMUSCULAR | Status: DC
  Administered 2019-06-25: 08:00:00 6 mg via INTRAVENOUS
  Filled 2019-06-24: qty 1

## 2019-06-24 MED ORDER — SODIUM CHLORIDE 0.9 % IV SOLN
200.0000 mg | Freq: Once | INTRAVENOUS | Status: AC
  Administered 2019-06-24: 23:00:00 200 mg via INTRAVENOUS
  Filled 2019-06-24: qty 40

## 2019-06-24 MED ORDER — MAGNESIUM SULFATE 4 GM/100ML IV SOLN
4.0000 g | Freq: Once | INTRAVENOUS | Status: AC
  Administered 2019-06-24: 4 g via INTRAVENOUS
  Filled 2019-06-24: qty 100

## 2019-06-24 MED ORDER — PANTOPRAZOLE SODIUM 40 MG PO TBEC
40.0000 mg | DELAYED_RELEASE_TABLET | Freq: Two times a day (BID) | ORAL | Status: DC
  Administered 2019-06-25 – 2019-06-27 (×7): 40 mg via ORAL
  Filled 2019-06-24 (×8): qty 1

## 2019-06-24 MED ORDER — ONDANSETRON HCL 4 MG/2ML IJ SOLN: 4.0000 mg | Freq: Four times a day (QID) | INTRAMUSCULAR | Status: DC | PRN

## 2019-06-24 MED ORDER — IPRATROPIUM-ALBUTEROL 0.5-2.5 (3) MG/3ML IN SOLN: 3.0000 mL | RESPIRATORY_TRACT | Status: DC | PRN

## 2019-06-24 MED ORDER — IPRATROPIUM-ALBUTEROL 0.5-2.5 (3) MG/3ML IN SOLN: 3.0000 mL | Freq: Four times a day (QID) | RESPIRATORY_TRACT | Status: DC

## 2019-06-24 MED ORDER — FLUTICASONE PROPIONATE 50 MCG/ACT NA SUSP
1.0000 | Freq: Two times a day (BID) | NASAL | Status: DC
  Administered 2019-06-25 – 2019-06-27 (×4): 1 via NASAL
  Filled 2019-06-24: qty 16

## 2019-06-24 MED ORDER — SODIUM CHLORIDE 0.9% FLUSH
3.0000 mL | Freq: Once | INTRAVENOUS | Status: AC
  Administered 2019-06-24: 19:00:00 3 mL via INTRAVENOUS

## 2019-06-24 NOTE — ED Triage Notes (Signed)
Pt arrives to ED w/ c/o sob. Pt states she tested pos for covid last week and has increased sob. Pt states she has hx of asthma. SPO2 88%, pt placed on 2 lpm O2.

## 2019-06-24 NOTE — ED Notes (Signed)
Pt ambulated in room, oxygen desaturated to 82% and was placed on 4 liters of oxygen.

## 2019-06-24 NOTE — ED Notes (Signed)
Pt placed on 3 lpm of o2 in waiting room.

## 2019-06-24 NOTE — ED Notes (Signed)
[phone] pts daughter [name], wants an update

## 2019-06-24 NOTE — ED Provider Notes (Signed)
[name] Provider Note   CSN: [id number] Arrival date & time: 06/24/19  1311     History Chief Complaint  Patient presents with   Shortness of Breath    Nicole [name] is a 74 y.o. female.  HPI     [name] is a 74 y.o. female, with a history of anemia, anxiety, asthma, GERD, hypercholesterolemia, presenting to the ED with shortness of breath worsening since yesterday.  Began having sore throat, fever, cough, diarrhea, nausea, loss of appetite, and headaches beginning around Wednesday, April 7. Chest soreness with coughing.  Tested positive for COVID last week. States she went for an infusion for COVID April 12.  Has been using albuterol inhaler without significant improvement. Denies vomiting, hematochezia/melena, abdominal pain, syncope, other chest pain, or any other complaints.    Past Medical History:  Diagnosis Date   Anemia    Anxiety    Back pain    Bloating    Blurred vision    occasional   Chest pain    Colon polyps    Dizziness    Endometriosis    history of   Fatigue    Fibromyalgia    Forgetfulness    GERD with esophagitis    Follows with Dr. Fuller Plan with GI, EGD in the past   Headache    migraines   Joint pain    Leg weakness    Major depressive disorder    Mild intermittent asthma, uncomplicated    Mixed diabetic hyperlipidemia associated with type 2 diabetes mellitus ([place])    Obesity    SOB (shortness of breath)    Type 2 diabetes mellitus without complication, without long-term current use of insulin ([place])    Urinary incontinence    Vaginitis     Patient Active Problem List   Diagnosis Date Noted   Mild intermittent asthma with (acute) exacerbation 06/24/2019   COVID-19 virus infection 123XX123   Acute metabolic encephalopathy 123XX123   Hypokalemia, inadequate intake 06/24/2019   Hypomagnesemia 06/24/2019   GERD with esophagitis    Major depressive  disorder    Type 2 diabetes mellitus without complication, without long-term current use of insulin ([place])    Mixed diabetic hyperlipidemia associated with type 2 diabetes mellitus ([place])    Chronic daily headache 12/07/2014   Memory loss 08/31/2014   Worsening headaches 08/31/2014   Atypical chest pain 11/05/2013   Anemia    Anxiety    Depression    Diabetes mellitus type 2 in obese (HCC)    Urinary incontinence    Endometriosis    Asthma    Fibromyalgia     Past Surgical History:  Procedure Laterality Date   ABDOMINAL HYSTERECTOMY     TAH BSO   APPENDECTOMY  1999   CATARACT EXTRACTION     both   CHOLECYSTECTOMY     DENTAL SURGERY     HERNIA REPAIR  2000   LAPAROSCOPIC ENDOMETRIOSIS FULGURATION       OB History    Gravida  1   Para  1   Term      Preterm      AB      Living  1     SAB      TAB      Ectopic      Multiple      Live Births              Family History  Problem Relation Age  of Onset   Hypertension Mother    Heart failure Mother    Diabetes Mother    Hypertension Father    Heart failure Father    Diabetes Father    Heart attack Father    Stroke Father    Cancer Brother        lung   Thyroid disease Brother    Cancer Brother        throat   Cancer Paternal Aunt        bone   Cancer Paternal Uncle        lung   Hypertension Maternal Grandmother    Hypertension Maternal Grandfather    Hypertension Paternal Grandmother    Hypertension Paternal Grandfather    Diabetes Daughter    Colon cancer Neg Hx    Stomach cancer Neg Hx     Social History   Tobacco Use   Smoking status: Never Smoker   Smokeless tobacco: Never Used  Substance Use Topics   Alcohol use: Yes    Alcohol/week: 0.0 standard drinks    Comment: occasional   Drug use: No    Home Medications Prior to Admission medications   Medication Sig Start Date End Date Taking? Authorizing Provider  aspirin 81 MG  tablet Take 81 mg by mouth daily.   Yes [provider]  DULoxetine (CYMBALTA) 30 MG capsule Take 2 capsules daily Patient taking differently: Take 30 mg by mouth daily.  07/10/16  Yes Cameron Sprang, MD  ergocalciferol (VITAMIN D2) 50000 UNITS capsule Take 50,000 Units by mouth once a week.     Yes [provider]  fenofibrate 160 MG tablet Take 160 mg by mouth daily.   Yes [provider]  fluticasone (FLONASE) 50 MCG/ACT nasal spray Place 1 spray into both nostrils 2 (two) times daily. 05/28/19  Yes [provider]  Fluticasone-Salmeterol (ADVAIR DISKUS) 250-50 MCG/DOSE AEPB Inhale 1 puff into the lungs 2 (two) times daily.   Yes [provider]  linagliptin (TRADJENTA) 5 MG TABS tablet Take 5 mg by mouth daily.    Yes [provider]  LORazepam (ATIVAN) 1 MG tablet 1 mg 2 (two) times daily.    Yes [provider]  oxyCODONE-acetaminophen (PERCOCET) 10-325 MG per tablet Take 1 tablet by mouth 2 (two) times daily as needed for pain.    Yes [provider]  pantoprazole (PROTONIX) 40 MG tablet Take 1 tablet (40 mg total) by mouth 2 (two) times daily. 08/14/11  Yes Ladene Artist, MD  NONFORMULARY OR COMPOUNDED ITEM Estradiol 0.02% vaginal cream prefilled applicators apply one twice weekly Patient not taking: Reported on 06/24/2019 05/23/15   Fontaine, Belinda Block, MD  [facility] DELICA LANCETS [id number] MISC  06/03/16   [provider]  [facility] VERIO test strip  06/03/16   [provider]  pregabalin (LYRICA) 100 MG capsule Take 1 cap twice a day for 2 weeks, then decrease to 1 cap daily for 2 weeks, then stop Patient not taking: Reported on 06/24/2019 07/18/17   Cameron Sprang, MD  [name] HFA 108 [phone] Base) MCG/ACT inhaler Inhale 1-2 puffs into the lungs See admin instructions. Uses as rescue inhaler Reported on 06/20/2015 04/21/15   [provider]    Allergies    Fish-derived products, Shellfish allergy, and  Aspirin  Review of Systems   Review of Systems  Constitutional: Positive for fatigue and fever.  Respiratory: Positive for cough and shortness of breath.   Cardiovascular: Positive for chest  pain. Negative for leg swelling.  Gastrointestinal: Positive for diarrhea and nausea. Negative for abdominal pain, blood in stool and vomiting.  Genitourinary: Negative for dysuria, frequency and hematuria.  Musculoskeletal: Negative for back pain.  All other systems reviewed and are negative.   Physical Exam Updated Vital Signs BP 118/81    Pulse 96    Temp 98.2 F (36.8 C) (Oral)    Resp (!) 24    Ht 5\' 2"  (1.575 m)    Wt 68 kg    SpO2 91%    BMI 27.44 kg/m   Physical Exam Vitals and nursing note reviewed.  Constitutional:      General: She is not in acute distress.    Appearance: She is well-developed. She is not diaphoretic.  HENT:     Head: Normocephalic and atraumatic.     Mouth/Throat:     Mouth: Mucous membranes are moist.     Pharynx: Oropharynx is clear.  Eyes:     Conjunctiva/sclera: Conjunctivae normal.  Cardiovascular:     Rate and Rhythm: Normal rate and regular rhythm.     Pulses: Normal pulses.          Radial pulses are 2+ on the right side and 2+ on the left side.       Posterior tibial pulses are 2+ on the right side and 2+ on the left side.     Heart sounds: Normal heart sounds.     Comments: Tactile temperature in the extremities appropriate and equal bilaterally. Pulmonary:     Effort: Pulmonary effort is normal. No respiratory distress.     Breath sounds: Wheezing present.     Comments: Room air SPO2 down to 88%.  Maintaining above 95% on 2 L supplemental O2. Abdominal:     Palpations: Abdomen is soft.     Tenderness: There is no abdominal tenderness. There is no guarding.  Musculoskeletal:     Cervical back: Neck supple.     Right lower leg: No edema.     Left lower leg: No edema.  Lymphadenopathy:     Cervical: No cervical adenopathy.  Skin:    General:  Skin is warm and dry.     Comments: Skin findings as shown in photos. This is the only place on the patient's body she has noticed this.  Neurological:     Mental Status: She is alert.     Comments: No noted acute cognitive deficit. Sensation grossly intact to light touch in the extremities.   Grip strengths equal bilaterally.   Strength 5/5 in all extremities.  Coordination intact.  Cranial nerves III-XII grossly intact.  Handles oral secretions without noted difficulty.  No noted phonation or speech deficit. No facial droop.   Psychiatric:        Mood and Affect: Mood and affect normal.        Speech: Speech normal.        Behavior: Behavior normal.          ED Results / Procedures / Treatments   Labs (all labs ordered are listed, but only abnormal results are displayed) Labs Reviewed  BASIC METABOLIC PANEL - Abnormal; Notable for the following components:      Result Value   Potassium 2.6 (*)    Chloride 96 (*)    Glucose, Bld 183 (*)    Creatinine, Ser 1.15 (*)    GFR calc non Af Amer 48 (*)    GFR calc Af Amer 55 (*)  All other components within normal limits  MAGNESIUM - Abnormal; Notable for the following components:   Magnesium 0.6 (*)    All other components within normal limits  CULTURE, BLOOD (ROUTINE X 2)  CULTURE, BLOOD (ROUTINE X 2)  CBC  HEPATIC FUNCTION PANEL  PROTIME-INR  RENAL FUNCTION PANEL  BLOOD GAS, ARTERIAL  HEMOGLOBIN A1C  VITAMIN B12  TSH  D-DIMER, QUANTITATIVE (NOT AT [facility])  CBC WITH DIFFERENTIAL/PLATELET  C-REACTIVE PROTEIN  FERRITIN  MAGNESIUM  PHOSPHORUS  ABO/RH  TROPONIN I (HIGH SENSITIVITY)  TROPONIN I (HIGH SENSITIVITY)    EKG EKG Interpretation  Date/Time:  Thursday June 24 2019 13:40:44 EDT Ventricular Rate:  97 PR Interval:  136 QRS Duration: 98 QT Interval:  378 QTC Calculation: 480 R Axis:   -21 Text Interpretation: Sinus rhythm with occasional Premature ventricular complexes Nonspecific ST abnormality  Abnormal ECG No old tracing to compare Confirmed by Aletta Edouard [phone]) on 06/24/2019 3:50:47 PM   Radiology DG Chest Portable 1 View  Result Date: 06/24/2019 CLINICAL DATA:  Shortness of breath and COVID-19 positivity EXAM: PORTABLE CHEST 1 VIEW COMPARISON:  None. FINDINGS: Cardiac shadows within normal limits. Mild aortic calcifications are seen. Patchy atelectatic changes are noted in the right base. No focal confluent infiltrate is seen. No bony abnormality is noted. IMPRESSION: Patchy right basilar atelectasis Electronically Signed   By: Inez Catalina M.D.   On: 06/24/2019 15:36    Procedures Procedures (including critical care time)  Medications Ordered in ED Medications  dexamethasone (DECADRON) injection 6 mg (has no administration in time range)  magnesium sulfate IVPB 4 g 100 mL (4 g Intravenous New Bag/Given 06/24/19 2051)  potassium chloride SA (KLOR-CON) CR tablet 40 mEq (has no administration in time range)  lactated ringers infusion ( Intravenous New Bag/Given 06/24/19 2051)  insulin aspart (novoLOG) injection 0-15 Units (has no administration in time range)  remdesivir 200 mg in sodium chloride 0.9% 250 mL IVPB (has no administration in time range)    Followed by  remdesivir 100 mg in sodium chloride 0.9 % 100 mL IVPB (has no administration in time range)  oxyCODONE-acetaminophen (PERCOCET/ROXICET) 5-325 MG per tablet 1 tablet (has no administration in time range)  fenofibrate tablet 160 mg (has no administration in time range)  DULoxetine (CYMBALTA) DR capsule 30 mg (has no administration in time range)  LORazepam (ATIVAN) tablet 1 mg (has no administration in time range)  pantoprazole (PROTONIX) EC tablet 40 mg (has no administration in time range)  fluticasone (FLONASE) 50 MCG/ACT nasal spray 1 spray (has no administration in time range)  enoxaparin (LOVENOX) injection 40 mg (has no administration in time range)  guaiFENesin-dextromethorphan (ROBITUSSIN DM) 100-10  MG/5ML syrup 10 mL (has no administration in time range)  chlorpheniramine-HYDROcodone (TUSSIONEX) 10-8 MG/5ML suspension 5 mL (has no administration in time range)  ondansetron (ZOFRAN) tablet 4 mg (has no administration in time range)    Or  ondansetron (ZOFRAN) injection 4 mg (has no administration in time range)  acetaminophen (TYLENOL) tablet 650 mg (has no administration in time range)  polyethylene glycol (MIRALAX / GLYCOLAX) packet 17 g (has no administration in time range)  albuterol (VENTOLIN HFA) 108 (90 Base) MCG/ACT inhaler 2 puff (has no administration in time range)  albuterol (VENTOLIN HFA) 108 (90 Base) MCG/ACT inhaler 2 puff (has no administration in time range)  albuterol (VENTOLIN HFA) 108 (90 Base) MCG/ACT inhaler 2 puff (2 puffs Inhalation Given 06/24/19 2052)  albuterol (VENTOLIN HFA) 108 (90 Base) MCG/ACT inhaler 2 puff (  has no administration in time range)  sodium chloride flush (NS) 0.9 % injection 3 mL (3 mLs Intravenous Given 06/24/19 1901)  albuterol (VENTOLIN HFA) 108 (90 Base) MCG/ACT inhaler 4 puff (4 puffs Inhalation Given 06/24/19 1720)  ipratropium (ATROVENT HFA) inhaler 2 puff (2 puffs Inhalation Given 06/24/19 1721)  potassium chloride SA (KLOR-CON) CR tablet 40 mEq (40 mEq Oral Given 06/24/19 1719)  potassium chloride 10 mEq in 100 mL IVPB (0 mEq Intravenous Stopped 06/24/19 2043)  lactated ringers bolus 500 mL (0 mLs Intravenous Stopped 06/24/19 2039)  ondansetron (ZOFRAN) injection 4 mg (4 mg Intravenous Given 06/24/19 1856)  dexamethasone (DECADRON) injection 6 mg (6 mg Intravenous Given 06/24/19 1857)    ED Course  I have reviewed the triage vital signs and the nursing notes.  Pertinent labs & imaging results that were available during my care of the patient were reviewed by me and considered in my medical decision making (see chart for details).  Clinical Course as of Jun 24 2051  Thu Jun 23, 8640  [id number] 74 year old female with history of asthma and new  diagnosis of Covid this week here with increased shortness of breath she thinks is her asthma.  Sats were 88% on room air.  Potassium low at 2.6 getting repleted.  Will likely need admission for further management of this.   [MB]  1947 Spoke with Dr. Cyd Silence, hospitalist. Agrees to admit the patient.   [SJ]    Clinical Course User Index [MB] Hayden Rasmussen, MD [SJ] Layla Maw   MDM Rules/Calculators/A&P                      Patient presents with worsening shortness of breath.  Hypoxic down to 88% upon arrival.  Requiring oxygen to maintain adequate SPO2. Patient is nontoxic appearing, afebrile, not tachycardic on my exam, not hypotensive,and is in no apparent distress.   I reviewed and interpreted the patient's labs and radiological studies.  Hypokalemia, hypomagnesemia, both addressed here in the ED.  Patient admitted for further management.  Findings and plan of care discussed with Ronnald Ramp, MD. Dr. Melina Copa personally evaluated and examined this patient.   Vitals:   06/24/19 1715 06/24/19 1715 06/24/19 1730 06/24/19 1745  BP: (!) 144/91 (!) 144/91 138/84 (!) 145/97  Pulse: (!) 101 (!) 101    Resp: (!) 21 (!) 21 (!) 24 (!) 21  Temp:      TempSrc:      SpO2: 96% 96%    Weight:      Height:       Vitals:   06/24/19 1945 06/24/19 2000 06/24/19 2015 06/24/19 2045  BP: (!) 144/65 138/74 133/62 139/72  Pulse: 100 (!) 102 (!) 102 99  Resp: (!) 21 (!) 24 (!) 27 (!) 23  Temp:      TempSrc:      SpO2: 100% 100% 99% 97%  Weight:      Height:         Final Clinical Impression(s) / ED Diagnoses Final diagnoses:  COVID-19  Hypoxia    Rx / DC Orders ED Discharge Orders    None       Layla Maw 06/24/19 2053    Hayden Rasmussen, MD 06/25/19 1030

## 2019-06-24 NOTE — H&P (Signed)
History and Physical    Nicole [name] KDX:[id number] DOB: [birth year] DOA: 06/24/2019  PCP: [name], MD  Patient coming from: Home   Chief Complaint:  Chief Complaint  Patient presents with  . Shortness of Breath     HPI:  74 year old female with past medical history of diabetes mellitus type 2, gastroesophageal reflux disease, depression, hyperlipidemia and mild intermittent asthma who presents to [facility] emergency department complaints of shortness of breath and cough.  Patient explains that approximately 1 week ago she began to experience shortness of breath.  Initially her shortness of breath was mild in intensity but progressively became more severe over the next several days.  Shortness of breath became associated with dry nonproductive cough as well as a sore throat.  Patient states that her shortness of breath was worse with exertion and improved with rest.  Shortness of breath became more and more severe as the days progressed.  Upon further questioning, patient states that her brother and sister-in-law were diagnosed with COVID-19 approximately 2 weeks ago and are currently admitted at [facility].  She endorses coming into contact with these family members in the past several weeks.  Patient eventually presented to see her primary care provider late last week where she was tested for COVID-19 and tested positive.  Patient was initially treated conservatively as an outpatient and returned early this week and "infusion" that she cannot name.    Despite these measures, patient symptoms continue to worsen.  Patient shortness of breath and cough became more and more severe.  Patient developed associated extremely poor appetite due to near constant severe nausea without any evidence of vomiting.  Patient does complain of intermittent sore abdominal pain when she takes deep breaths or coughs.  Due to progressively worsening symptoms the patient mentioned presented to  [facility] emergency department for evaluation.  Upon evaluation in the emergency department patient was found to have right-sided patchy infiltrates on chest x-ray as well as hypoxia requiring supplemental oxygen.  He was also found to have severe hypokalemia and hypomagnesemia likely secondary to poor oral intake.  The hospitalist group was then called to assess patient for admission the hospital.     Review of Systems: A 10-system review of systems has been performed and all systems are negative with the exception of what is listed in the HPI.  SKIN: Patient states that she has had a 1 week history of splotchy discoloration of the arms   Past Medical History:  Diagnosis Date  . Anemia   . Anxiety   . Back pain   . Bloating   . Blurred vision    occasional  . Chest pain   . Colon polyps   . Dizziness   . Endometriosis    history of  . Fatigue   . Fibromyalgia   . Forgetfulness   . GERD with esophagitis    Follows with Dr. Fuller Plan with GI, EGD in the past  . Headache    migraines  . Joint pain   . Leg weakness   . Major depressive disorder   . Mild intermittent asthma, uncomplicated   . Mixed diabetic hyperlipidemia associated with type 2 diabetes mellitus ([place])   . Obesity   . SOB (shortness of breath)   . Type 2 diabetes mellitus without complication, without long-term current use of insulin ([place])   . Urinary incontinence   . Vaginitis     Past Surgical History:  Procedure Laterality Date  . ABDOMINAL  HYSTERECTOMY     TAH BSO  . APPENDECTOMY  1999  . CATARACT EXTRACTION     both  . CHOLECYSTECTOMY    . DENTAL SURGERY    . HERNIA REPAIR  2000  . LAPAROSCOPIC ENDOMETRIOSIS FULGURATION       reports that she has never smoked. She has never used smokeless tobacco. She reports current alcohol use. She reports that she does not use drugs.  Allergies  Allergen Reactions  . Fish-Derived Products Anaphylaxis  . Shellfish Allergy Shortness Of Breath    In dye for  scan.  . Aspirin Nausea Only    Family History  Problem Relation Age of Onset  . Hypertension Mother   . Heart failure Mother   . Diabetes Mother   . Hypertension Father   . Heart failure Father   . Diabetes Father   . Heart attack Father   . Stroke Father   . Cancer Brother        lung  . Thyroid disease Brother   . Cancer Brother        throat  . Cancer Paternal Aunt        bone  . Cancer Paternal Uncle        lung  . Hypertension Maternal Grandmother   . Hypertension Maternal Grandfather   . Hypertension Paternal Grandmother   . Hypertension Paternal Grandfather   . Diabetes Daughter   . Colon cancer Neg Hx   . Stomach cancer Neg Hx      Prior to Admission medications   Medication Sig Start Date End Date Taking? Authorizing Provider  aspirin 81 MG tablet Take 81 mg by mouth daily.   Yes [provider]  DULoxetine (CYMBALTA) 30 MG capsule Take 2 capsules daily Patient taking differently: Take 30 mg by mouth daily.  07/10/16  Yes Cameron Sprang, MD  ergocalciferol (VITAMIN D2) 50000 UNITS capsule Take 50,000 Units by mouth once a week.     Yes [provider]  fenofibrate 160 MG tablet Take 160 mg by mouth daily.   Yes [provider]  fluticasone (FLONASE) 50 MCG/ACT nasal spray Place 1 spray into both nostrils 2 (two) times daily. 05/28/19  Yes [provider]  Fluticasone-Salmeterol (ADVAIR DISKUS) 250-50 MCG/DOSE AEPB Inhale 1 puff into the lungs 2 (two) times daily.   Yes [provider]  linagliptin (TRADJENTA) 5 MG TABS tablet Take 5 mg by mouth daily.    Yes [provider]  LORazepam (ATIVAN) 1 MG tablet 1 mg 2 (two) times daily.    Yes [provider]  oxyCODONE-acetaminophen (PERCOCET) 10-325 MG per tablet Take 1 tablet by mouth 2 (two) times daily as needed for pain.    Yes [provider]  pantoprazole (PROTONIX) 40 MG tablet Take 1 tablet (40 mg total) by mouth 2 (two) times daily.  08/14/11  Yes Ladene Artist, MD  NONFORMULARY OR COMPOUNDED ITEM Estradiol 0.02% vaginal cream prefilled applicators apply one twice weekly Patient not taking: Reported on 06/24/2019 05/23/15   Fontaine, Belinda Block, MD  [facility] DELICA LANCETS 94R MISC  06/03/16   [provider]  [facility] VERIO test strip  06/03/16   [provider]  pregabalin (LYRICA) 100 MG capsule Take 1 cap twice a day for 2 weeks, then decrease to 1 cap daily for 2 weeks, then stop Patient not taking: Reported on 06/24/2019 07/18/17   Cameron Sprang, MD  [facility] HFA [phone] Base) MCG/ACT inhaler Inhale 1-2  puffs into the lungs See admin instructions. Uses as rescue inhaler Reported on 06/20/2015 04/21/15   [provider]    Physical Exam: Vitals:   06/24/19 1855 06/24/19 1900 06/24/19 1915 06/24/19 1930  BP: (!) 149/70 (!) 145/83 132/60 136/64  Pulse: (!) 107 (!) 104 97 100  Resp: 20 (!) 21 20 (!) 24  Temp:      TempSrc:      SpO2: 96% 96% 97% 99%  Weight:      Height:        Constitutional: Lethargic but arousable, oriented x2.  No associated distress.   Skin: Notable poor skin turgor.  Splotchy purplish discoloration of the mid and distal arms.  No rashes, no lesions, good skin turgor noted. Eyes: Pupils are equally reactive to light.  No evidence of scleral icterus or conjunctival pallor.  ENMT: Dry mucous membranes noted.  Posterior pharynx clear of any exudate or lesions. Normal dentition.   Neck: normal, supple, no masses, no thyromegaly Respiratory: Significant bibasilar rales, worse in the right base.  This is associated with expiratory wheezing.   Normal respiratory effort. No accessory muscle use.  Cardiovascular: Regular rate and rhythm, no murmurs / rubs / gallops. No extremity edema. 2+ pedal pulses. No carotid bruits.  Back:   Nontender without crepitus or deformity. Abdomen: Notable generalized abdominal tenderness.  Abdomen is soft however.  No evidence of intra-abdominal  masses.  Positive bowel sounds noted in all quadrants.   Musculoskeletal: No joint deformity upper and lower extremities. Good ROM, no contractures. Normal muscle tone.  Neurologic: Notably lethargic but arousable and oriented x2.  CN 2-12 grossly intact. Sensation intact, strength noted to be 5 out of 5 in all 4 extremities.  Patient is following all commands.  Patient is responsive to verbal stimuli.   Psychiatric: Patient presents as a depressed mood with flat affect.  Patient seems to possess insight as to theircurrent situation.     Labs on Admission: I have personally reviewed following labs and imaging studies -   CBC: Recent Labs  Lab 06/24/19 1407  WBC 8.0  HGB 14.7  HCT 42.2  MCV 89.6  PLT 474   Basic Metabolic Panel: Recent Labs  Lab 06/24/19 1407 06/24/19 1658  NA 136  --   K 2.6*  --   CL 96*  --   CO2 26  --   GLUCOSE 183*  --   BUN 19  --   CREATININE 1.15*  --   CALCIUM 8.9  --   MG  --  0.6*   GFR: Estimated Creatinine Clearance: 40.6 mL/min (A) (by C-G formula based on SCr of 1.15 mg/dL (H)). Liver Function Tests: Recent Labs  Lab 06/24/19 1658  AST 34  ALT 27  ALKPHOS 48  BILITOT 0.8  PROT 7.7  ALBUMIN 3.8   No results for input(s): LIPASE, AMYLASE in the last 168 hours. No results for input(s): AMMONIA in the last 168 hours. Coagulation Profile: Recent Labs  Lab 06/24/19 1658  INR 0.9   Cardiac Enzymes: No results for input(s): CKTOTAL, CKMB, CKMBINDEX, TROPONINI in the last 168 hours. BNP (last 3 results) No results for input(s): PROBNP in the last 8760 hours. HbA1C: No results for input(s): HGBA1C in the last 72 hours. CBG: No results for input(s): GLUCAP in the last 168 hours. Lipid Profile: No results for input(s): CHOL, HDL, LDLCALC, TRIG, CHOLHDL, LDLDIRECT in the last 72 hours. Thyroid Function Tests: No results for input(s): TSH, T4TOTAL, FREET4, T3FREE,  THYROIDAB in the last 72 hours. Anemia Panel: No results for  input(s): VITAMINB12, FOLATE, FERRITIN, TIBC, IRON, RETICCTPCT in the last 72 hours. Urine analysis:    Component Value Date/Time   COLORURINE YELLOW 02/23/2014 1528   APPEARANCEUR CLEAR 02/23/2014 1528   LABSPEC 1.010 02/23/2014 1528   PHURINE 6.0 02/23/2014 1528   GLUCOSEU NEG 02/23/2014 1528   HGBUR NEG 02/23/2014 1528   BILIRUBINUR NEG 02/23/2014 1528   KETONESUR NEG 02/23/2014 1528   PROTEINUR NEG 02/23/2014 1528   UROBILINOGEN 0.2 02/23/2014 1528   NITRITE NEG 02/23/2014 1528   LEUKOCYTESUR TRACE (A) 02/23/2014 1528    Radiological Exams on Admission personally reviewed: DG Chest Portable 1 View  Result Date: 06/24/2019 CLINICAL DATA:  Shortness of breath and COVID-19 positivity EXAM: PORTABLE CHEST 1 VIEW COMPARISON:  None. FINDINGS: Cardiac shadows within normal limits. Mild aortic calcifications are seen. Patchy atelectatic changes are noted in the right base. No focal confluent infiltrate is seen. No bony abnormality is noted. IMPRESSION: Patchy right basilar atelectasis Electronically Signed   By: Inez Catalina M.D.   On: 06/24/2019 15:36    EKG: Personally reviewed.  Rhythm is sinus rhythm with heart rate of 97 bpm.  Evidence of PVCs.  No dynamic ST segment changes appreciated.  Assessment/Plan  COVID-19 virus infection   1 week history of shortness of breath and dry cough with confirmation of COVID-19 infection by her outpatient provider last week.  She reports receiving an outpatient infusion for treatment of her COVID-19 earlier this week but is unable to identify which it is and I do not have access to those records, Likely a Bamlanivimab-etesevimab combination therapy.   Presenting with hypoxia in the emergency department, likely secondary to underlying COVID-19 infection.  Will obtain room air ABG  Patient's been placed on supplemental oxygen with target oxygen saturations of 94% or higher  Providing patient with bronchodilator therapy via MDI considering  concurrent asthma exacerbation  Initiating dexamethasone 6 mg IV every 24 hours for treatment of COVID-19 with hypoxia as well as asthma exacerbation  Initiating remdesivir infusions  Admitting to COVID-19 unit  Droplet and contact isolation  Of note, patient is also complaining of splotchy purplish rash of the arms -considering temporal nature of the onset of her rash this is likely a COVID-19 related cutaneous phenomenon.  Will monitor.    Mild intermittent asthma with (acute) exacerbation   Patient exhibiting evidence of diffuse expiratory wheezing likely secondary to asthma exacerbation  Asthma exacerbation is likely secondary to underlying COVID-19 infection  Treating patient with Decadron 6 mg every 24 hours for both hypoxia related to COVID-19 as well as asthma exacerbation  Providing patient with scheduled bronchodilator therapy via MDI with additional as needed bronchodilator therapy for bouts of shortness of breath and wheezing  Obtain room air ABG    Acute metabolic encephalopathy   Lethargy and disorientation on arrival to the emergency department  Likely secondary to COVID-19 infection and dehydration  Treating underlying COVID-19 infection  Hydrating patient with intravenous isotonic fluids  Will obtain TSH and vitamin B12 levels  Monitoring for symptomatic improvement    Hypokalemia, inadequate intake   Hypokalemia likely secondary to poor oral intake  Hypokalemia is exacerbated by concurrent hypomagnesemia  Supplementing with both intravenous and oral means while also supplementing hypomagnesemia  Monitoring potassium levels with serial chemistry    Hypomagnesemia  Severe hypomagnesemia, likely secondary to poor oral intake  Is likely exacerbating hypokalemia  Replacing with intravenous magnesium sulfate  Monitoring  with serial chemistries    Type 2 diabetes mellitus without complication, without long-term current use of insulin  ([place])  . Patient been placed on Accu-Cheks before every meal and nightly . Holding home regimen of hypoglycemics . Pending hemoglobin A1c . Diabetic Diet    GERD with esophagitis   Continue home regimen of twice daily PPI    Major depressive disorder   Continue home regimen of psychotropic medication regimen    Mixed diabetic hyperlipidemia associated with type 2 diabetes mellitus ([place])   Continue home regimen of lipid-lowering therapy     Code Status:  Full code Family Communication: deferred  Disposition Plan: Patient is anticipated to be discharged to home once patient has met maximum benefit from current hospitalization.   Consults called: None Admission status: Patient will be admitted to Inpatient and is anticipated to remain in the hospital for greater than 2 midnights.   Vernelle Emerald MD [facility] Pager [phone]  If 7PM-7AM, please contact night-coverage [email or web address] Use universal [place] password for that web site. If you do not have the password, please call the hospital operator.  06/24/2019, 8:29 PM

## 2019-06-25 DIAGNOSIS — E876 Hypokalemia: Secondary | ICD-10-CM

## 2019-06-25 DIAGNOSIS — U071 COVID-19: Principal | ICD-10-CM

## 2019-06-25 DIAGNOSIS — K2100 Gastro-esophageal reflux disease with esophagitis, without bleeding: Secondary | ICD-10-CM

## 2019-06-25 DIAGNOSIS — G9341 Metabolic encephalopathy: Secondary | ICD-10-CM

## 2019-06-25 LAB — PHOSPHORUS: Phosphorus: 2.8 mg/dL (ref 2.5–4.6)

## 2019-06-25 LAB — CBC WITH DIFFERENTIAL/PLATELET
Abs Immature Granulocytes: 0.02 10*3/uL (ref 0.00–0.07)
Basophils Absolute: 0 10*3/uL (ref 0.0–0.1)
Basophils Relative: 0 %
Eosinophils Absolute: 0 10*3/uL (ref 0.0–0.5)
Eosinophils Relative: 0 %
HCT: 35 % — ABNORMAL LOW (ref 36.0–46.0)
Hemoglobin: 12.3 g/dL (ref 12.0–15.0)
Immature Granulocytes: 0 %
Lymphocytes Relative: 5 %
Lymphs Abs: 0.4 10*3/uL — ABNORMAL LOW (ref 0.7–4.0)
MCH: 30.5 pg (ref 26.0–34.0)
MCHC: 35.1 g/dL (ref 30.0–36.0)
MCV: 86.8 fL (ref 80.0–100.0)
Monocytes Absolute: 0.2 10*3/uL (ref 0.1–1.0)
Monocytes Relative: 3 %
Neutro Abs: 7.2 10*3/uL (ref 1.7–7.7)
Neutrophils Relative %: 92 %
Platelets: 143 10*3/uL — ABNORMAL LOW (ref 150–400)
RBC: 4.03 MIL/uL (ref 3.87–5.11)
RDW: 12.2 % (ref 11.5–15.5)
WBC: 7.9 10*3/uL (ref 4.0–10.5)
nRBC: 0 % (ref 0.0–0.2)

## 2019-06-25 LAB — HEMOGLOBIN A1C
Hgb A1c MFr Bld: 6.5 % — ABNORMAL HIGH (ref 4.8–5.6)
Mean Plasma Glucose: 139.85 mg/dL

## 2019-06-25 LAB — GLUCOSE, CAPILLARY
Glucose-Capillary: 155 mg/dL — ABNORMAL HIGH (ref 70–99)
Glucose-Capillary: 148 mg/dL — ABNORMAL HIGH (ref 70–99)
Glucose-Capillary: 210 mg/dL — ABNORMAL HIGH (ref 70–99)
Glucose-Capillary: 171 mg/dL — ABNORMAL HIGH (ref 70–99)

## 2019-06-25 LAB — MAGNESIUM: Magnesium: 2.1 mg/dL (ref 1.7–2.4)

## 2019-06-25 LAB — D-DIMER, QUANTITATIVE: D-Dimer, Quant: 0.33 ug/mL-FEU (ref 0.00–0.50)

## 2019-06-25 LAB — VITAMIN B12: Vitamin B-12: 1686 pg/mL — ABNORMAL HIGH (ref 180–914)

## 2019-06-25 LAB — C-REACTIVE PROTEIN: CRP: 7 mg/dL — ABNORMAL HIGH (ref <1.0)

## 2019-06-25 LAB — FERRITIN: Ferritin: 293 ng/mL (ref 11–307)

## 2019-06-25 MED ORDER — METHYLPREDNISOLONE SODIUM SUCC 40 MG IJ SOLR
40.0000 mg | Freq: Two times a day (BID) | INTRAMUSCULAR | Status: DC
  Administered 2019-06-26 – 2019-06-28 (×5): 40 mg via INTRAVENOUS
  Filled 2019-06-25 (×5): qty 1

## 2019-06-25 MED ORDER — ALUM & MAG HYDROXIDE-SIMETH 200-200-20 MG/5ML PO SUSP
30.0000 mL | Freq: Four times a day (QID) | ORAL | Status: DC | PRN
  Administered 2019-06-25: 30 mL via ORAL
  Filled 2019-06-25: qty 30

## 2019-06-25 MED ORDER — ALBUTEROL SULFATE HFA 108 (90 BASE) MCG/ACT IN AERS
4.0000 | INHALATION_SPRAY | Freq: Four times a day (QID) | RESPIRATORY_TRACT | Status: DC
  Administered 2019-06-25 – 2019-06-27 (×7): 4 via RESPIRATORY_TRACT
  Filled 2019-06-25: qty 6.7

## 2019-06-25 NOTE — Progress Notes (Signed)
PROGRESS NOTE                                                                                                                                                                                                             Patient Demographics:    Nicole [name], is a 74 y.o. female, DOB - Oct 02, 1945, [id number]  Outpatient Primary MD for the patient is [name], MD   Admit date - 06/24/2019   LOS - 1  Chief Complaint  Patient presents with  . Shortness of Breath       Brief Narrative: Patient is a 74 y.o. female with PMHx of bronchial asthma, DM-2, HLD, depression who tested positive for COVID-19 on 4/12-subsequently received bamlanivimab\etesevimab infusion on 4/13-presented to the hospital with shortness of breath-found to have COVID-19 pneumonia along with bronchial asthma exacerbation and subsequently admitted to the hospitalist service.  See below for further details.  Significant Events: 4/12>> COVID-19-test done at PCPs office 4/13>>bamlanivimab\etesevimab infusion  4/15>> admit to [facility] for COVID-19 pneumonia and bronchial asthma exacerbation  COVID-19 medications: Steroids: 4/15>> Remdesivir: 4/15>>  Antibiotics: None  Microbiology data: None  DVT prophylaxis: SQ Lovenox  Procedures: None  Consults: None    Subjective:    Nicole [name] today feels better-still wheezing-still feels tight in the chest.  Cough continues.   Assessment  & Plan :   Covid 19 Viral pneumonia: Improved-no fever-not hypoxic this morning-plans are to continue steroids and remdesivir.  If clinical improvement continues-she would be a good candidate for outpatient remdesivir infusion.  Follow clinical trajectory/inflammatory markers.  Fever: afebrile  O2 requirements:  SpO2: 95 % O2 Flow Rate (L/min): 2 L/min   COVID-19 Labs: Recent Labs    06/24/19 2237 06/25/19 0253 06/25/19 0748  DDIMER 0.55*  --  0.33    FERRITIN  --  293  --   CRP  --  7.0*  --     No results found for: BNP  No results for input(s): PROCALCITON in the last 168 hours.  No results found for: SARSCOV2NAA   Prone/Incentive Spirometry: encouraged  incentive spirometry use 3-4/hour.  Bronchial asthma exacerbation: Still feels "tight"-wheezing-and not yet back to baseline.  Although feels better than yesterday.  Continue IV steroids and bronchodilators.  Continue to follow closely-she is moving air well.  Acute metabolic encephalopathy: Secondary to COVID-19-she is completely  awake and alert-she is no longer lethargic.  Hypokalemia/hypomagnesemia: Being repleted-recheck tomorrow.  Depression: Continue Cymbalta-appears stable  Anxiety: Stable-remains in usual dosing of lorazepam.  HLD: Continue fenofibrate  DM-2: CBG stable-continue SSI  CBG (last 3)  Recent Labs    06/24/19 2207 06/25/19 0753  GLUCAP 186* 155*    ABG:    Component Value Date/Time   PHART 7.490 (H) 06/24/2019 2101   PCO2ART 28.9 (L) 06/24/2019 2101   PO2ART 86.0 06/24/2019 2101   HCO3 22.0 06/24/2019 2101   TCO2 23 06/24/2019 2101   O2SAT 97.0 06/24/2019 2101    Vent Settings: N/A  Condition -Stable  Family Communication  : Left a voicemail for daughter on 4/16  Code Status :  Full Code  Diet :  Diet Order            Diet Carb Modified Fluid consistency: Thin; Room service appropriate? Yes  Diet effective now               Disposition Plan  : Home in the next day or so depending on clinical improvement.  Probably would be a good candidate for home Covid program and outpatient remdesivir infusion  Barriers to discharge: complete 5 days of IV Remdesivir  Antimicorbials  :    Anti-infectives (From admission, onward)   Start     Dose/Rate Route Frequency Ordered Stop   06/25/19 1000  remdesivir 100 mg in sodium chloride 0.9 % 100 mL IVPB     100 mg 200 mL/hr over 30 Minutes Intravenous Daily 06/24/19 2019 06/29/19 0959    06/24/19 2100  remdesivir 200 mg in sodium chloride 0.9% 250 mL IVPB     200 mg 580 mL/hr over 30 Minutes Intravenous Once 06/24/19 2019 06/25/19 0012      Inpatient Medications  Scheduled Meds: . albuterol  4 puff Inhalation Q6H  . DULoxetine  30 mg Oral Daily  . enoxaparin (LOVENOX) injection  40 mg Subcutaneous Q24H  . fenofibrate  160 mg Oral Daily  . fluticasone  1 spray Each Nare BID  . insulin aspart  0-15 Units Subcutaneous TID AC & HS  . LORazepam  1 mg Oral BID  . [START ON 06/26/2019] methylPREDNISolone (SOLU-MEDROL) injection  40 mg Intravenous Q12H  . pantoprazole  40 mg Oral BID   Continuous Infusions: . lactated ringers 100 mL/hr at 06/25/19 0235  . remdesivir 100 mg in NS 100 mL 100 mg (06/25/19 0828)   PRN Meds:.acetaminophen, albuterol, albuterol, alum & mag hydroxide-simeth, chlorpheniramine-HYDROcodone, guaiFENesin-dextromethorphan, ondansetron **OR** ondansetron (ZOFRAN) IV, oxyCODONE-acetaminophen, polyethylene glycol   Time Spent in minutes  25  See all Orders from today for further details   Oren Binet M.D on 06/25/2019 at 11:21 AM  To page go to [email or web address] - use universal password  [facility] -  Office  [phone]    Objective:   Vitals:   06/24/19 2100 06/24/19 2115 06/24/19 2204 06/25/19 0404  BP: 128/72 90/60 131/75 123/71  Pulse: 95 (!) 101 98 87  Resp: 16 19 20 20   Temp:   99.9 F (37.7 C) 97.9 F (36.6 C)  TempSrc:   Oral Oral  SpO2: 95% 94% 95% 95%  Weight:    69.4 kg  Height:        Wt Readings from Last 3 Encounters:  06/25/19 69.4 kg  07/18/17 79.8 kg  07/10/16 83.5 kg     Intake/Output Summary (Last 24 hours) at 06/25/2019 1121 Last data filed at 06/25/2019 0100 Gross per 24  hour  Intake 1072.42 ml  Output --  Net 1072.42 ml     Physical Exam Gen Exam:Alert awake-not in any distress HEENT:atraumatic, normocephalic Chest: Moving air-but still with diffuse rhonchi all over CVS:S1S2  regular Abdomen:soft non tender, non distended Extremities:no edema Neurology: Non focal Skin: no rash   Data Review:    CBC Recent Labs  Lab 06/24/19 1407 06/24/19 2101 06/25/19 0253  WBC 8.0  --  7.9  HGB 14.7 11.9* 12.3  HCT 42.2 35.0* 35.0*  PLT 192  --  143*  MCV 89.6  --  86.8  MCH 31.2  --  30.5  MCHC 34.8  --  35.1  RDW 12.5  --  12.2  LYMPHSABS  --   --  0.4*  MONOABS  --   --  0.2  EOSABS  --   --  0.0  BASOSABS  --   --  0.0    Chemistries  Recent Labs  Lab 06/24/19 1407 06/24/19 1658 06/24/19 2101 06/24/19 2237 06/25/19 0253  NA 136  --  134* 134*  --   K 2.6*  --  3.1* 3.2*  --   CL 96*  --   --  97*  --   CO2 26  --   --  22  --   GLUCOSE 183*  --   --  221*  --   BUN 19  --   --  18  --   CREATININE 1.15*  --   --  1.04*  --   CALCIUM 8.9  --   --  8.7*  --   MG  --  0.6*  --   --  2.1  AST  --  34  --   --   --   ALT  --  27  --   --   --   ALKPHOS  --  48  --   --   --   BILITOT  --  0.8  --   --   --    ------------------------------------------------------------------------------------------------------------------ No results for input(s): CHOL, HDL, LDLCALC, TRIG, CHOLHDL, LDLDIRECT in the last 72 hours.  Lab Results  Component Value Date   HGBA1C 6.5 (H) 06/24/2019   ------------------------------------------------------------------------------------------------------------------ Recent Labs    06/24/19 2237  TSH 0.716   ------------------------------------------------------------------------------------------------------------------ Recent Labs    06/24/19 2237 06/25/19 0253  VITAMINB12 1,686*  --   FERRITIN  --  293    Coagulation profile Recent Labs  Lab 06/24/19 1658  INR 0.9    Recent Labs    06/24/19 2237 06/25/19 0748  DDIMER 0.55* 0.33    Cardiac Enzymes No results for input(s): CKMB, TROPONINI, MYOGLOBIN in the last 168 hours.  Invalid input(s):  CK ------------------------------------------------------------------------------------------------------------------ No results found for: BNP  Micro Results No results found for this or any previous visit (from the past 240 hour(s)).  Radiology Reports DG Chest Portable 1 View  Result Date: 06/24/2019 CLINICAL DATA:  Shortness of breath and COVID-19 positivity EXAM: PORTABLE CHEST 1 VIEW COMPARISON:  None. FINDINGS: Cardiac shadows within normal limits. Mild aortic calcifications are seen. Patchy atelectatic changes are noted in the right base. No focal confluent infiltrate is seen. No bony abnormality is noted. IMPRESSION: Patchy right basilar atelectasis Electronically Signed   By: Inez Catalina M.D.   On: 06/24/2019 15:36

## 2019-06-25 NOTE — TOC Initial Note (Signed)
Transition of Care [facility]) - Initial/Assessment Note    Patient Details  Name: Nicole [name] MRN: [id number] Date of Birth: 11/10/1945  Transition of Care [facility]) CM/SW Contact:    Maryclare Labrador, RN Phone Number: 06/25/2019, 12:18 PM  Clinical Narrative:     PTA independent from home.  Pt confirms she has a PCP and denied barriers with paying for medications.   Pt is interested in COVID at home program - CM sent referral via email              Expected Discharge Plan: Home/Self Care     Patient Goals and CMS Choice   CMS Medicare.gov Compare Post Acute Care list provided to:: Patient Choice offered to / list presented to : Patient  Expected Discharge Plan and Services Expected Discharge Plan: Home/Self Care       Living arrangements for the past 2 months: Single Family Home                                      Prior Living Arrangements/Services Living arrangements for the past 2 months: Single Family Home   Patient language and need for interpreter reviewed:: Yes Do you feel safe going back to the place where you live?: Yes      Need for Family Participation in Patient Care: Yes (Comment) Care giver support system in place?: Yes (comment)   Criminal Activity/Legal Involvement Pertinent to Current Situation/Hospitalization: No - Comment as needed  Activities of Daily Living      Permission Sought/Granted   Permission granted to share information with : Yes, Verbal Permission Granted              Emotional Assessment   Attitude/Demeanor/Rapport: Self-Confident, Engaged Affect (typically observed): Accepting Orientation: : Oriented to Self, Oriented to Place, Oriented to  Time, Oriented to Situation      Admission diagnosis:  Hypoxia [R09.02] COVID-19 virus infection [U07.1] COVID-19 [U07.1] Patient Active Problem List   Diagnosis Date Noted  . Mild intermittent asthma with (acute) exacerbation 06/24/2019  . COVID-19 virus infection  06/24/2019  . Acute metabolic encephalopathy [id number]  . Hypokalemia, inadequate intake 06/24/2019  . Hypomagnesemia 06/24/2019  . GERD with esophagitis   . Major depressive disorder   . Type 2 diabetes mellitus without complication, without long-term current use of insulin ([place])   . Mixed diabetic hyperlipidemia associated with type 2 diabetes mellitus ([place])   . Chronic daily headache 12/07/2014  . Memory loss 08/31/2014  . Worsening headaches 08/31/2014  . Atypical chest pain 11/05/2013  . Anemia   . Anxiety   . Depression   . Diabetes mellitus type 2 in obese ([place])   . Urinary incontinence   . Endometriosis   . Asthma   . Fibromyalgia    PCP:  [name], MD Pharmacy:   [facility] [phone] [place], [place] [address] [place] [address] Phone: [phone] Fax: [place] [address], [place] [address] Phone: [phone] Fax: [place], [place] [address] Phone: [phone] Fax: [phone]     Social Determinants of Health ([place]) Interventions    Readmission Risk Interventions No flowsheet  data found.

## 2019-06-25 NOTE — Plan of Care (Signed)

## 2019-06-26 LAB — CBC WITH DIFFERENTIAL/PLATELET
Abs Immature Granulocytes: 0.03 10*3/uL (ref 0.00–0.07)
Basophils Absolute: 0 10*3/uL (ref 0.0–0.1)
Basophils Relative: 0 %
Eosinophils Absolute: 0 10*3/uL (ref 0.0–0.5)
Eosinophils Relative: 0 %
HCT: 36.4 % (ref 36.0–46.0)
Hemoglobin: 12.4 g/dL (ref 12.0–15.0)
Immature Granulocytes: 0 %
Lymphocytes Relative: 19 %
Lymphs Abs: 1.4 10*3/uL (ref 0.7–4.0)
MCH: 30.8 pg (ref 26.0–34.0)
MCHC: 34.1 g/dL (ref 30.0–36.0)
MCV: 90.5 fL (ref 80.0–100.0)
Monocytes Absolute: 0.4 10*3/uL (ref 0.1–1.0)
Monocytes Relative: 6 %
Neutro Abs: 5.3 10*3/uL (ref 1.7–7.7)
Neutrophils Relative %: 75 %
Platelets: 214 10*3/uL (ref 150–400)
RBC: 4.02 MIL/uL (ref 3.87–5.11)
RDW: 12.7 % (ref 11.5–15.5)
WBC: 7.1 10*3/uL (ref 4.0–10.5)
nRBC: 0 % (ref 0.0–0.2)

## 2019-06-26 LAB — GLUCOSE, CAPILLARY
Glucose-Capillary: 107 mg/dL — ABNORMAL HIGH (ref 70–99)
Glucose-Capillary: 185 mg/dL — ABNORMAL HIGH (ref 70–99)
Glucose-Capillary: 242 mg/dL — ABNORMAL HIGH (ref 70–99)
Glucose-Capillary: 135 mg/dL — ABNORMAL HIGH (ref 70–99)

## 2019-06-26 LAB — D-DIMER, QUANTITATIVE: D-Dimer, Quant: 0.38 ug/mL-FEU (ref 0.00–0.50)

## 2019-06-26 LAB — C-REACTIVE PROTEIN: CRP: 5.6 mg/dL — ABNORMAL HIGH (ref <1.0)

## 2019-06-26 LAB — MAGNESIUM: Magnesium: 1.9 mg/dL (ref 1.7–2.4)

## 2019-06-26 LAB — FERRITIN: Ferritin: 257 ng/mL (ref 11–307)

## 2019-06-26 NOTE — Plan of Care (Signed)

## 2019-06-26 NOTE — Progress Notes (Signed)
PROGRESS NOTE                                                                                                                                                                                                             Patient Demographics:    Nicole [name], is a 74 y.o. female, DOB - 03-10-1946, [id number]  Outpatient Primary MD for the patient is [name], MD   Admit date - 06/24/2019   LOS - 2  Chief Complaint  Patient presents with  . Shortness of Breath       Brief Narrative: Patient is a 74 y.o. female with PMHx of bronchial asthma, DM-2, HLD, depression who tested positive for COVID-19 on 4/12-subsequently received bamlanivimab\etesevimab infusion on 4/13-presented to the hospital with shortness of breath-found to have COVID-19 pneumonia along with bronchial asthma exacerbation and subsequently admitted to the hospitalist service.  See below for further details.  Significant Events: 4/12>> COVID-19-test done at PCPs office 4/13>>bamlanivimab\etesevimab infusion  4/15>> admit to [facility] for COVID-19 pneumonia and bronchial asthma exacerbation  COVID-19 medications: Steroids: 4/15>> Remdesivir: 4/15>>  Antibiotics: None  Microbiology data: None  DVT prophylaxis: SQ Lovenox  Procedures: None  Consults: None    Subjective:   Somewhat better than yesterday but still wheezing-not yet at baseline.  Has not yet been ambulated.   Assessment  & Plan :   Covid 19 Viral pneumonia: Slowly improving-still wheezing-continues to cough-not hypoxic-on room air.  Continue steroids/remdesivir.  CRP downtrending.  Continue to follow clinical trajectory and inflammatory markers.  But no oxygen requirement as of this morning.  Fever: afebrile  O2 requirements:  SpO2: 94 % O2 Flow Rate (L/min): 2 L/min   COVID-19 Labs: Recent Labs    06/24/19 2237 06/25/19 0253 06/25/19 0748 06/26/19 0811  DDIMER 0.55*   --  0.33 0.38  FERRITIN  --  293  --  257  CRP  --  7.0*  --  5.6*    No results found for: BNP  No results for input(s): PROCALCITON in the last 168 hours.  No results found for: SARSCOV2NAA   Prone/Incentive Spirometry: encouraged  incentive spirometry use 3-4/hour.  Bronchial asthma exacerbation: Although improved-still with wheezing-continues to feel "tight" and not yet back to baseline.  Continue steroids and bronchodilators.  Follow closely.    Acute metabolic encephalopathy: Secondary to COVID-19-she is  completely awake and alert-she is no longer lethargic.  Hypokalemia/hypomagnesemia: Repleted-recheck tomorrow  Depression: Continue Cymbalta-appears stable  Anxiety: Stable-remains in usual dosing of lorazepam.  HLD: Continue fenofibrate  DM-2: CBG stable-continue SSI  CBG (last 3)  Recent Labs    06/25/19 2029 06/26/19 0734 06/26/19 1133  GLUCAP 171* 107* 185*    ABG:    Component Value Date/Time   PHART 7.490 (H) 06/24/2019 2101   PCO2ART 28.9 (L) 06/24/2019 2101   PO2ART 86.0 06/24/2019 2101   HCO3 22.0 06/24/2019 2101   TCO2 23 06/24/2019 2101   O2SAT 97.0 06/24/2019 2101    Vent Settings: N/A  Condition -Stable  Family Communication  : Left a voicemail for daughter on 4/17  Code Status :  Full Code  Diet :  Diet Order            Diet Carb Modified Fluid consistency: Thin; Room service appropriate? Yes  Diet effective now               Disposition Plan  : Home in the next day or so depending on clinical improvement.  Probably would be a good candidate for home Covid program and outpatient remdesivir infusion  Barriers to discharge: complete 5 days of IV Remdesivir  Antimicorbials  :    Anti-infectives (From admission, onward)   Start     Dose/Rate Route Frequency Ordered Stop   06/25/19 1000  remdesivir 100 mg in sodium chloride 0.9 % 100 mL IVPB     100 mg 200 mL/hr over 30 Minutes Intravenous Daily 06/24/19 2019 06/29/19 0959    06/24/19 2100  remdesivir 200 mg in sodium chloride 0.9% 250 mL IVPB     200 mg 580 mL/hr over 30 Minutes Intravenous Once 06/24/19 2019 06/25/19 0012      Inpatient Medications  Scheduled Meds: . albuterol  4 puff Inhalation Q6H  . DULoxetine  30 mg Oral Daily  . enoxaparin (LOVENOX) injection  40 mg Subcutaneous Q24H  . fenofibrate  160 mg Oral Daily  . fluticasone  1 spray Each Nare BID  . insulin aspart  0-15 Units Subcutaneous TID AC & HS  . LORazepam  1 mg Oral BID  . methylPREDNISolone (SOLU-MEDROL) injection  40 mg Intravenous Q12H  . pantoprazole  40 mg Oral BID   Continuous Infusions: . remdesivir 100 mg in NS 100 mL 100 mg (06/26/19 0842)   PRN Meds:.acetaminophen, albuterol, albuterol, alum & mag hydroxide-simeth, chlorpheniramine-HYDROcodone, guaiFENesin-dextromethorphan, ondansetron **OR** ondansetron (ZOFRAN) IV, oxyCODONE-acetaminophen, polyethylene glycol   Time Spent in minutes  25  See all Orders from today for further details   Oren Binet M.D on 06/26/2019 at 1:59 PM  To page go to [email or web address] - use universal password  [facility] -  Office  [phone]    Objective:   Vitals:   06/25/19 1941 06/25/19 2323 06/26/19 0326 06/26/19 1134  BP: 132/68 (!) 145/73 (!) 151/78 (!) 133/112  Pulse: 83 73 78 79  Resp: 14 17 14 14   Temp: 98 F (36.7 C) 98 F (36.7 C) 98.1 F (36.7 C) 98.1 F (36.7 C)  TempSrc: Oral Oral Oral Oral  SpO2: 96% 93% 92% 94%  Weight:      Height:        Wt Readings from Last 3 Encounters:  06/25/19 69.4 kg  07/18/17 79.8 kg  07/10/16 83.5 kg     Intake/Output Summary (Last 24 hours) at 06/26/2019 1359 Last data filed at 06/26/2019 0325 Gross per 24 hour  Intake 17.9 ml  Output --  Net 17.9 ml     Physical Exam Gen Exam:Alert awake-not in any distress HEENT:atraumatic, normocephalic Chest: ++ Rhonchi bilaterally CVS:S1S2 regular Abdomen:soft non tender, non distended Extremities:no  edema Neurology: Non focal Skin: no rash   Data Review:    CBC Recent Labs  Lab 06/24/19 1407 06/24/19 2101 06/25/19 0253 06/26/19 0811  WBC 8.0  --  7.9 7.1  HGB 14.7 11.9* 12.3 12.4  HCT 42.2 35.0* 35.0* 36.4  PLT 192  --  143* 214  MCV 89.6  --  86.8 90.5  MCH 31.2  --  30.5 30.8  MCHC 34.8  --  35.1 34.1  RDW 12.5  --  12.2 12.7  LYMPHSABS  --   --  0.4* 1.4  MONOABS  --   --  0.2 0.4  EOSABS  --   --  0.0 0.0  BASOSABS  --   --  0.0 0.0    Chemistries  Recent Labs  Lab 06/24/19 1407 06/24/19 1658 06/24/19 2101 06/24/19 2237 06/25/19 0253 06/26/19 0811  NA 136  --  134* 134*  --   --   K 2.6*  --  3.1* 3.2*  --   --   CL 96*  --   --  97*  --   --   CO2 26  --   --  22  --   --   GLUCOSE 183*  --   --  221*  --   --   BUN 19  --   --  18  --   --   CREATININE 1.15*  --   --  1.04*  --   --   CALCIUM 8.9  --   --  8.7*  --   --   MG  --  0.6*  --   --  2.1 1.9  AST  --  34  --   --   --   --   ALT  --  27  --   --   --   --   ALKPHOS  --  48  --   --   --   --   BILITOT  --  0.8  --   --   --   --    ------------------------------------------------------------------------------------------------------------------ No results for input(s): CHOL, HDL, LDLCALC, TRIG, CHOLHDL, LDLDIRECT in the last 72 hours.  Lab Results  Component Value Date   HGBA1C 6.5 (H) 06/24/2019   ------------------------------------------------------------------------------------------------------------------ Recent Labs    06/24/19 2237  TSH 0.716   ------------------------------------------------------------------------------------------------------------------ Recent Labs    06/24/19 2237 06/25/19 0253 06/26/19 0811  VITAMINB12 1,686*  --   --   FERRITIN  --  293 257    Coagulation profile Recent Labs  Lab 06/24/19 1658  INR 0.9    Recent Labs    06/25/19 0748 06/26/19 0811  DDIMER 0.33 0.38    Cardiac Enzymes No results for input(s): CKMB, TROPONINI,  MYOGLOBIN in the last 168 hours.  Invalid input(s): CK ------------------------------------------------------------------------------------------------------------------ No results found for: BNP  Micro Results Recent Results (from the past 240 hour(s))  Culture, blood (Routine X 2) w Reflex to ID Panel     Status: None (Preliminary result)   Collection Time: 06/24/19 10:30 PM   Specimen: BLOOD  Result Value Ref Range Status   Specimen Description BLOOD RIGHT ARM  Final   Special Requests   Final    BOTTLES DRAWN AEROBIC AND ANAEROBIC Blood Culture adequate  volume   Culture   Final    NO GROWTH 2 DAYS Performed at [facility], [place] [address] [place], [place] [address]    Report Status PENDING  Incomplete  Culture, blood (Routine X 2) w Reflex to ID Panel     Status: None (Preliminary result)   Collection Time: 06/24/19 10:37 PM   Specimen: BLOOD  Result Value Ref Range Status   Specimen Description BLOOD RIGHT ARM  Final   Special Requests   Final    BOTTLES DRAWN AEROBIC AND ANAEROBIC Blood Culture adequate volume   Culture   Final    NO GROWTH 2 DAYS Performed at [facility], [place] [address], [place],  [address]    Report Status PENDING  Incomplete    Radiology Reports DG Chest Portable 1 View  Result Date: 06/24/2019 CLINICAL DATA:  Shortness of breath and COVID-19 positivity EXAM: PORTABLE CHEST 1 VIEW COMPARISON:  None. FINDINGS: Cardiac shadows within normal limits. Mild aortic calcifications are seen. Patchy atelectatic changes are noted in the right base. No focal confluent infiltrate is seen. No bony abnormality is noted. IMPRESSION: Patchy right basilar atelectasis Electronically Signed   By: Inez Catalina M.D.   On: 06/24/2019 15:36

## 2019-06-27 LAB — CBC WITH DIFFERENTIAL/PLATELET
Abs Immature Granulocytes: 0.13 10*3/uL — ABNORMAL HIGH (ref 0.00–0.07)
Basophils Absolute: 0 10*3/uL (ref 0.0–0.1)
Basophils Relative: 0 %
Eosinophils Absolute: 0 10*3/uL (ref 0.0–0.5)
Eosinophils Relative: 0 %
HCT: 36.1 % (ref 36.0–46.0)
Hemoglobin: 12.3 g/dL (ref 12.0–15.0)
Immature Granulocytes: 2 %
Lymphocytes Relative: 11 %
Lymphs Abs: 0.7 10*3/uL (ref 0.7–4.0)
MCH: 30.8 pg (ref 26.0–34.0)
MCHC: 34.1 g/dL (ref 30.0–36.0)
MCV: 90.5 fL (ref 80.0–100.0)
Monocytes Absolute: 0.2 10*3/uL (ref 0.1–1.0)
Monocytes Relative: 3 %
Neutro Abs: 5.2 10*3/uL (ref 1.7–7.7)
Neutrophils Relative %: 84 %
Platelets: 224 10*3/uL (ref 150–400)
RBC: 3.99 MIL/uL (ref 3.87–5.11)
RDW: 12.3 % (ref 11.5–15.5)
WBC: 6.1 10*3/uL (ref 4.0–10.5)
nRBC: 0 % (ref 0.0–0.2)

## 2019-06-27 LAB — COMPREHENSIVE METABOLIC PANEL
ALT: 19 U/L (ref 0–44)
AST: 14 U/L — ABNORMAL LOW (ref 15–41)
Albumin: 2.8 g/dL — ABNORMAL LOW (ref 3.5–5.0)
Alkaline Phosphatase: 39 U/L (ref 38–126)
Anion gap: 8 (ref 5–15)
BUN: 11 mg/dL (ref 8–23)
CO2: 28 mmol/L (ref 22–32)
Calcium: 9.9 mg/dL (ref 8.9–10.3)
Chloride: 104 mmol/L (ref 98–111)
Creatinine, Ser: 0.69 mg/dL (ref 0.44–1.00)
GFR calc Af Amer: >60 mL/min (ref >60)
GFR calc non Af Amer: >60 mL/min (ref >60)
Glucose, Bld: 173 mg/dL — ABNORMAL HIGH (ref 70–99)
Potassium: 4.2 mmol/L (ref 3.5–5.1)
Sodium: 140 mmol/L (ref 135–145)
Total Bilirubin: 0.5 mg/dL (ref 0.3–1.2)
Total Protein: 6.5 g/dL (ref 6.5–8.1)

## 2019-06-27 LAB — GLUCOSE, CAPILLARY
Glucose-Capillary: 135 mg/dL — ABNORMAL HIGH (ref 70–99)
Glucose-Capillary: 136 mg/dL — ABNORMAL HIGH (ref 70–99)
Glucose-Capillary: 161 mg/dL — ABNORMAL HIGH (ref 70–99)
Glucose-Capillary: 198 mg/dL — ABNORMAL HIGH (ref 70–99)
Glucose-Capillary: 145 mg/dL — ABNORMAL HIGH (ref 70–99)

## 2019-06-27 LAB — C-REACTIVE PROTEIN: CRP: 2.8 mg/dL — ABNORMAL HIGH (ref <1.0)

## 2019-06-27 LAB — MAGNESIUM: Magnesium: 1.5 mg/dL — ABNORMAL LOW (ref 1.7–2.4)

## 2019-06-27 LAB — D-DIMER, QUANTITATIVE: D-Dimer, Quant: 0.33 ug/mL-FEU (ref 0.00–0.50)

## 2019-06-27 LAB — FERRITIN: Ferritin: 213 ng/mL (ref 11–307)

## 2019-06-27 MED ORDER — BENZONATATE 100 MG PO CAPS
200.0000 mg | ORAL_CAPSULE | Freq: Three times a day (TID) | ORAL | Status: DC
  Administered 2019-06-27 – 2019-06-28 (×4): 200 mg via ORAL
  Filled 2019-06-27 (×4): qty 2

## 2019-06-27 MED ORDER — ALBUTEROL SULFATE HFA 108 (90 BASE) MCG/ACT IN AERS
6.0000 | INHALATION_SPRAY | Freq: Four times a day (QID) | RESPIRATORY_TRACT | Status: DC
  Administered 2019-06-27 – 2019-06-28 (×3): 6 via RESPIRATORY_TRACT
  Filled 2019-06-27: qty 6.7

## 2019-06-27 MED ORDER — LORATADINE 10 MG PO TABS
10.0000 mg | ORAL_TABLET | Freq: Every day | ORAL | Status: DC
  Administered 2019-06-27: 10 mg via ORAL
  Filled 2019-06-27 (×2): qty 1

## 2019-06-27 MED ORDER — FLUTICASONE PROPIONATE 50 MCG/ACT NA SUSP
2.0000 | Freq: Every day | NASAL | Status: DC
  Administered 2019-06-27: 2 via NASAL
  Filled 2019-06-27: qty 16

## 2019-06-27 MED ORDER — OXYMETAZOLINE HCL 0.05 % NA SOLN
1.0000 | Freq: Two times a day (BID) | NASAL | Status: DC
  Administered 2019-06-27 (×2): 1 via NASAL
  Filled 2019-06-27: qty 30

## 2019-06-27 MED ORDER — MAGNESIUM SULFATE 4 GM/100ML IV SOLN
4.0000 g | Freq: Once | INTRAVENOUS | Status: AC
  Administered 2019-06-27: 4 g via INTRAVENOUS
  Filled 2019-06-27: qty 100

## 2019-06-27 MED ORDER — GUAIFENESIN ER 600 MG PO TB12
600.0000 mg | ORAL_TABLET | Freq: Two times a day (BID) | ORAL | Status: DC
  Administered 2019-06-27 – 2019-06-28 (×3): 600 mg via ORAL
  Filled 2019-06-27 (×3): qty 1

## 2019-06-27 NOTE — Evaluation (Signed)
Physical Therapy Evaluation Patient Details Name: Nicole [name] MRN: [id number] DOB: October 20, 1945 Today's Date: 06/27/2019   History of Present Illness  74 y.o. female with PMHx of bronchial asthma, DM-2, HLD, depression who tested positive for COVID-19 on 4/12.  Clinical Impression  Pt admitted with above diagnosis. PTA pt lived alone, independent. On eval, she required min guard assist transfers and ambulation 45' with RW. Desat to low 80s on RA during mobility. SpO2 100% on 2L in recliner at end of session.  Pt currently with functional limitations due to the deficits listed below (see PT Problem List). Pt will benefit from skilled PT to increase their independence and safety with mobility to allow discharge to the venue listed below.  Pt's daughter plans to stay with her upon discharge.     Follow Up Recommendations No PT follow up;Supervision for mobility/OOB    Equipment Recommendations  Rolling walker with 5" wheels    Recommendations for Other Services       Precautions / Restrictions Precautions Precautions: Other (comment) Precaution Comments: watch sats Restrictions Weight Bearing Restrictions: No      Mobility  Bed Mobility Overal bed mobility: Modified Independent             General bed mobility comments: HOB elevated  Transfers Overall transfer level: Needs assistance Equipment used: Ambulation equipment used Transfers: Sit to/from Stand;Stand Pivot Transfers Sit to Stand: Min guard Stand pivot transfers: Min guard          Ambulation/Gait Ambulation/Gait assistance: Min guard Gait Distance (Feet): 60 Feet Assistive device: Rolling walker (2 wheeled) Gait Pattern/deviations: Step-through pattern;Decreased stride length Gait velocity: decreased Gait velocity interpretation: 1.31 - 2.62 ft/sec, indicative of limited community ambulator General Gait Details: Mobilized on RA with desat to low 80s  Stairs            Wheelchair Mobility     Modified Rankin (Stroke Patients Only)       Balance Overall balance assessment: Mild deficits observed, not formally tested                                           Pertinent Vitals/Pain Pain Assessment: No/denies pain    Home Living Family/patient expects to be discharged to:: Private residence Living Arrangements: Alone Available Help at Discharge: Family;Available 24 hours/day(daughter) Type of Home: House Home Access: Level entry     Home Layout: One level Home Equipment: None      Prior Function Level of Independence: Independent               Hand Dominance        Extremity/Trunk Assessment   Upper Extremity Assessment Upper Extremity Assessment: Defer to OT evaluation    Lower Extremity Assessment Lower Extremity Assessment: Generalized weakness    Cervical / Trunk Assessment Cervical / Trunk Assessment: Normal  Communication   Communication: No difficulties  Cognition Arousal/Alertness: Awake/alert Behavior During Therapy: WFL for tasks assessed/performed Overall Cognitive Status: Within Functional Limits for tasks assessed                                 General Comments: slow to respond      General Comments General comments (skin integrity, edema, etc.): On arrival, pt on RA. Badger present in bed with O2 setting of 2L. SpO2 80%. 2L replaced  with recovery to 94%. Pt mobilized on RA with desat to low 80s. 2L replaced in recliner with increase to 100%.    Exercises     Assessment/Plan    PT Assessment Patient needs continued PT services  PT Problem List Decreased strength;Decreased mobility;Cardiopulmonary status limiting activity;Decreased activity tolerance       PT Treatment Interventions DME instruction;Therapeutic activities;Gait training;Therapeutic exercise;Patient/family education;Balance training;Functional mobility training    PT Goals (Current goals can be found in the Care Plan section)   Acute Rehab PT Goals Patient Stated Goal: home PT Goal Formulation: With patient Time For Goal Achievement: 07/11/19 Potential to Achieve Goals: Good    Frequency Min 3X/week   Barriers to discharge        Co-evaluation               AM-PAC PT "6 Clicks" Mobility  Outcome Measure Help needed turning from your back to your side while in a flat bed without using bedrails?: None Help needed moving from lying on your back to sitting on the side of a flat bed without using bedrails?: None Help needed moving to and from a bed to a chair (including a wheelchair)?: A Little Help needed standing up from a chair using your arms (e.g., wheelchair or bedside chair)?: A Little Help needed to walk in hospital room?: A Little Help needed climbing 3-5 steps with a railing? : A Little 6 Click Score: 20    End of Session Equipment Utilized During Treatment: Oxygen Activity Tolerance: Patient tolerated treatment well Patient left: in chair;with call bell/phone within reach Nurse Communication: Mobility status PT Visit Diagnosis: Difficulty in walking, not elsewhere classified (R26.2)    Time: [id number] PT Time Calculation (min) (ACUTE ONLY): 18 min   Charges:   PT Evaluation $PT Eval Moderate Complexity: 1 Mod          Lorrin Goodell, PT  Office # [phone] Pager [phone]   Lorriane Shire 06/27/2019, 12:17 PM

## 2019-06-27 NOTE — Progress Notes (Addendum)
PROGRESS NOTE                                                                                                                                                                                                             Patient Demographics:    Nicole [name], is a 74 y.o. female, DOB - [birth year], [id number]  Outpatient Primary MD for the patient is [name], MD   Admit date - 06/24/2019   LOS - 3  Chief Complaint  Patient presents with  . Shortness of Breath       Brief Narrative: Patient is a 74 y.o. female with PMHx of bronchial asthma, DM-2, HLD, depression who tested positive for COVID-19 on 4/12-subsequently received bamlanivimab\etesevimab infusion on 4/13-presented to the hospital with shortness of breath-found to have COVID-19 pneumonia along with bronchial asthma exacerbation and subsequently admitted to the hospitalist service.  See below for further details.  Significant Events: 4/12>> COVID-19-test done at PCPs office 4/13>>bamlanivimab\etesevimab infusion  4/15>> admit to [facility] for COVID-19 pneumonia and bronchial asthma exacerbation  COVID-19 medications: Steroids: 4/15>> Remdesivir: 4/15>>  Antibiotics: None  Microbiology data: None  DVT prophylaxis: SQ Lovenox  Procedures: None  Consults: None    Subjective:   Continues to wheeze-still feels tight in the chest but claims that she is slowly improving and feels better than the past few days.   Assessment  & Plan :   Acute hypoxic respiratory failure secondary Covid 19 Viral pneumonia: Slowly improving-not hypoxic (but hypoxic on presentation)-CRP downtrending-continue steroids and remdesivir.    Fever: afebrile  O2 requirements:  SpO2: 96 % O2 Flow Rate (L/min): 2 L/min   COVID-19 Labs: Recent Labs    06/24/19 2237 06/25/19 0253 06/25/19 0748 06/26/19 0811 06/27/19 0436  DDIMER   < >  --  0.33 0.38 0.33  FERRITIN  --   293  --  257 213  CRP  --  7.0*  --  5.6* 2.8*   < > = values in this interval not displayed.    No results found for: BNP  No results for input(s): PROCALCITON in the last 168 hours.  No results found for: SARSCOV2NAA   Prone/Incentive Spirometry: encouraged  incentive spirometry use 3-4/hour.  Bronchial asthma with exacerbation: Although better-noted back to baseline-still wheezing-continue bronchodilators and IV steroids.  Suspect needs another 1 day of inpatient  monitoring before consideration of discharge.    Acute metabolic encephalopathy: Secondary to COVID-19-she is completely awake and alert-she is no longer lethargic.  Hypokalemia/hypomagnesemia: Potassium normal-continue to replete magnesium-recheck tomorrow.  Depression: Continue Cymbalta-appears stable  Anxiety: Stable-remains in usual dosing of lorazepam.  HLD: Continue fenofibrate  DM-2: CBG stable-continue SSI  CBG (last 3)  Recent Labs    06/26/19 2053 06/27/19 0757 06/27/19 0810  GLUCAP 135* 135* 136*    ABG:    Component Value Date/Time   PHART 7.490 (H) 06/24/2019 2101   PCO2ART 28.9 (L) 06/24/2019 2101   PO2ART 86.0 06/24/2019 2101   HCO3 22.0 06/24/2019 2101   TCO2 23 06/24/2019 2101   O2SAT 97.0 06/24/2019 2101    Vent Settings: N/A  Condition -Stable  Family Communication : Spoke with daughter over the phone on 4/18  Code Status :  Full Code  Diet :  Diet Order            Diet Carb Modified Fluid consistency: Thin; Room service appropriate? Yes  Diet effective now               Disposition Plan  : Home 4/19 if clinical improvement continues  Barriers to discharge: complete 5 days of IV Remdesivir  Antimicorbials  :    Anti-infectives (From admission, onward)   Start     Dose/Rate Route Frequency Ordered Stop   06/25/19 1000  remdesivir 100 mg in sodium chloride 0.9 % 100 mL IVPB     100 mg 200 mL/hr over 30 Minutes Intravenous Daily 06/24/19 2019 06/29/19 0959    06/24/19 2100  remdesivir 200 mg in sodium chloride 0.9% 250 mL IVPB     200 mg 580 mL/hr over 30 Minutes Intravenous Once 06/24/19 2019 06/25/19 0012      Inpatient Medications  Scheduled Meds: . albuterol  6 puff Inhalation Q6H  . benzonatate  200 mg Oral TID  . DULoxetine  30 mg Oral Daily  . enoxaparin (LOVENOX) injection  40 mg Subcutaneous Q24H  . fenofibrate  160 mg Oral Daily  . fluticasone  2 spray Each Nare Daily  . guaiFENesin  600 mg Oral BID  . insulin aspart  0-15 Units Subcutaneous TID AC & HS  . loratadine  10 mg Oral Daily  . LORazepam  1 mg Oral BID  . methylPREDNISolone (SOLU-MEDROL) injection  40 mg Intravenous Q12H  . oxymetazoline  1 spray Each Nare BID  . pantoprazole  40 mg Oral BID   Continuous Infusions: . magnesium sulfate bolus IVPB 4 g (06/27/19 1011)  . remdesivir 100 mg in NS 100 mL 100 mg (06/27/19 0848)   PRN Meds:.acetaminophen, albuterol, alum & mag hydroxide-simeth, chlorpheniramine-HYDROcodone, ondansetron **OR** ondansetron (ZOFRAN) IV, oxyCODONE-acetaminophen, polyethylene glycol   Time Spent in minutes  25  See all Orders from today for further details   Oren Binet M.D on 06/27/2019 at 11:56 AM  To page go to [email or web address] - use universal password  [facility] -  Office  [phone]    Objective:   Vitals:   06/26/19 2008 06/27/19 0000 06/27/19 0425 06/27/19 0810  BP: (!) 148/87 (!) 153/82 (!) 153/87 (!) 153/87  Pulse: 79 76 91 71  Resp: 12 18 14 20   Temp: 98.2 F (36.8 C) 98.5 F (36.9 C) 98.3 F (36.8 C) 98.2 F (36.8 C)  TempSrc: Oral Oral Oral Oral  SpO2: 93% 93% (!) 85% 96%  Weight:      Height:  Wt Readings from Last 3 Encounters:  06/25/19 69.4 kg  07/18/17 79.8 kg  07/10/16 83.5 kg     Intake/Output Summary (Last 24 hours) at 06/27/2019 1156 Last data filed at 06/27/2019 0700 Gross per 24 hour  Intake 270.71 ml  Output --  Net 270.71 ml     Physical Exam Gen Exam:Alert awake-not  in any distress HEENT:atraumatic, normocephalic Chest: ++ Rhonchi bilaterally CVS:S1S2 regular Abdomen:soft non tender, non distended Extremities:no edema Neurology: Non focal Skin: no rash   Data Review:    CBC Recent Labs  Lab 06/24/19 1407 06/24/19 2101 06/25/19 0253 06/26/19 0811 06/27/19 0436  WBC 8.0  --  7.9 7.1 6.1  HGB 14.7 11.9* 12.3 12.4 12.3  HCT 42.2 35.0* 35.0* 36.4 36.1  PLT 192  --  143* 214 224  MCV 89.6  --  86.8 90.5 90.5  MCH 31.2  --  30.5 30.8 30.8  MCHC 34.8  --  35.1 34.1 34.1  RDW 12.5  --  12.2 12.7 12.3  LYMPHSABS  --   --  0.4* 1.4 0.7  MONOABS  --   --  0.2 0.4 0.2  EOSABS  --   --  0.0 0.0 0.0  BASOSABS  --   --  0.0 0.0 0.0    Chemistries  Recent Labs  Lab 06/24/19 1407 06/24/19 1658 06/24/19 2101 06/24/19 2237 06/25/19 0253 06/26/19 0811 06/27/19 0436  NA 136  --  134* 134*  --   --  140  K 2.6*  --  3.1* 3.2*  --   --  4.2  CL 96*  --   --  97*  --   --  104  CO2 26  --   --  22  --   --  28  GLUCOSE 183*  --   --  221*  --   --  173*  BUN 19  --   --  18  --   --  11  CREATININE 1.15*  --   --  1.04*  --   --  0.69  CALCIUM 8.9  --   --  8.7*  --   --  9.9  MG  --  0.6*  --   --  2.1 1.9 1.5*  AST  --  34  --   --   --   --  14*  ALT  --  27  --   --   --   --  19  ALKPHOS  --  48  --   --   --   --  39  BILITOT  --  0.8  --   --   --   --  0.5   ------------------------------------------------------------------------------------------------------------------ No results for input(s): CHOL, HDL, LDLCALC, TRIG, CHOLHDL, LDLDIRECT in the last 72 hours.  Lab Results  Component Value Date   HGBA1C 6.5 (H) 06/24/2019   ------------------------------------------------------------------------------------------------------------------ Recent Labs    06/24/19 2237  TSH 0.716   ------------------------------------------------------------------------------------------------------------------ Recent Labs    06/24/19 2237  06/25/19 0253 06/26/19 [id number] 06/27/19 0436  [id number] 1,686*  --   --   --   FERRITIN  --    < > 257 213   < > = values in this interval not displayed.    Coagulation profile Recent Labs  Lab 06/24/19 1658  INR 0.9    Recent Labs    06/26/19 0811 06/27/19 0436  DDIMER 0.38 0.33    Cardiac Enzymes No results for input(s): CKMB,  TROPONINI, MYOGLOBIN in the last 168 hours.  Invalid input(s): CK ------------------------------------------------------------------------------------------------------------------ No results found for: BNP  Micro Results Recent Results (from the past 240 hour(s))  Culture, blood (Routine X 2) w Reflex to ID Panel     Status: None (Preliminary result)   Collection Time: 06/24/19 10:30 PM   Specimen: BLOOD  Result Value Ref Range Status   Specimen Description BLOOD RIGHT ARM  Final   Special Requests   Final    BOTTLES DRAWN AEROBIC AND ANAEROBIC Blood Culture adequate volume   Culture   Final    NO GROWTH 3 DAYS Performed at [facility], [place] [address], [place], [place] [address]    Report Status PENDING  Incomplete  Culture, blood (Routine X 2) w Reflex to ID Panel     Status: None (Preliminary result)   Collection Time: 06/24/19 10:37 PM   Specimen: BLOOD  Result Value Ref Range Status   Specimen Description BLOOD RIGHT ARM  Final   Special Requests   Final    BOTTLES DRAWN AEROBIC AND ANAEROBIC Blood Culture adequate volume   Culture   Final    NO GROWTH 3 DAYS Performed at [facility], [place] [address], [place], [place] [address]    Report Status PENDING  Incomplete    Radiology Reports DG Chest Portable 1 View  Result Date: 06/24/2019 CLINICAL DATA:  Shortness of breath and COVID-19 positivity EXAM: PORTABLE CHEST 1 VIEW COMPARISON:  None. FINDINGS: Cardiac shadows within normal limits. Mild aortic calcifications are seen. Patchy atelectatic changes are noted in the right base. No focal confluent infiltrate is seen.  No bony abnormality is noted. IMPRESSION: Patchy right basilar atelectasis Electronically Signed   By: Inez Catalina M.D.   On: 06/24/2019 15:36

## 2019-06-27 NOTE — Plan of Care (Signed)

## 2019-06-28 DIAGNOSIS — J4521 Mild intermittent asthma with (acute) exacerbation: Secondary | ICD-10-CM

## 2019-06-28 DIAGNOSIS — E119 Type 2 diabetes mellitus without complications: Secondary | ICD-10-CM

## 2019-06-28 LAB — CBC WITH DIFFERENTIAL/PLATELET
Abs Immature Granulocytes: 0.24 10*3/uL — ABNORMAL HIGH (ref 0.00–0.07)
Basophils Absolute: 0.1 10*3/uL (ref 0.0–0.1)
Basophils Relative: 1 %
Eosinophils Absolute: 0 10*3/uL (ref 0.0–0.5)
Eosinophils Relative: 0 %
HCT: 38.1 % (ref 36.0–46.0)
Hemoglobin: 13.1 g/dL (ref 12.0–15.0)
Immature Granulocytes: 3 %
Lymphocytes Relative: 12 %
Lymphs Abs: 0.9 10*3/uL (ref 0.7–4.0)
MCH: 30.5 pg (ref 26.0–34.0)
MCHC: 34.4 g/dL (ref 30.0–36.0)
MCV: 88.8 fL (ref 80.0–100.0)
Monocytes Absolute: 0.3 10*3/uL (ref 0.1–1.0)
Monocytes Relative: 5 %
Neutro Abs: 5.6 10*3/uL (ref 1.7–7.7)
Neutrophils Relative %: 79 %
Platelets: 281 10*3/uL (ref 150–400)
RBC: 4.29 MIL/uL (ref 3.87–5.11)
RDW: 11.9 % (ref 11.5–15.5)
WBC: 7 10*3/uL (ref 4.0–10.5)
nRBC: 0 % (ref 0.0–0.2)

## 2019-06-28 LAB — C-REACTIVE PROTEIN: CRP: 1.5 mg/dL — ABNORMAL HIGH (ref <1.0)

## 2019-06-28 LAB — D-DIMER, QUANTITATIVE: D-Dimer, Quant: 0.35 ug/mL-FEU (ref 0.00–0.50)

## 2019-06-28 LAB — GLUCOSE, CAPILLARY
Glucose-Capillary: 135 mg/dL — ABNORMAL HIGH (ref 70–99)
Glucose-Capillary: 94 mg/dL (ref 70–99)

## 2019-06-28 LAB — MAGNESIUM: Magnesium: 1.8 mg/dL (ref 1.7–2.4)

## 2019-06-28 LAB — FERRITIN: Ferritin: 199 ng/mL (ref 11–307)

## 2019-06-28 MED ORDER — LORATADINE 10 MG PO TABS: 10.0000 mg | ORAL_TABLET | Freq: Every day | ORAL | 0 refills | Status: DC

## 2019-06-28 MED ORDER — PROAIR HFA 108 (90 BASE) MCG/ACT IN AERS: 2.0000 | INHALATION_SPRAY | RESPIRATORY_TRACT | 0 refills | Status: DC | PRN

## 2019-06-28 MED ORDER — BENZONATATE 200 MG PO CAPS: 200.0000 mg | ORAL_CAPSULE | Freq: Three times a day (TID) | ORAL | 0 refills | Status: DC | PRN

## 2019-06-28 MED ORDER — PREDNISONE 10 MG PO TABS: ORAL_TABLET | ORAL | 0 refills | Status: DC

## 2019-06-28 NOTE — Discharge Summary (Signed)
PATIENT DETAILS Name: Nicole [name] Age: 74 y.o. Sex: female Date of Birth: 30-Mar-1945 MRN: [id number]. Admitting Physician: Vernelle Emerald, MD [id number], Annie Main, MD  Admit Date: 06/24/2019 Discharge date: 06/28/2019  Recommendations for Outpatient Follow-up:  1. Follow up with PCP in 1-2 weeks 2. Please obtain CMP/CBC in one week 3. Repeat Chest Xray in 4-6 week  Admitted From:  Home  Disposition: [place]: No  Equipment/Devices: None  Discharge Condition: Stable  CODE STATUS: FULL CODE  Diet recommendation:  Diet Order            Diet - low sodium heart healthy        Diet Carb Modified        Diet Carb Modified Fluid consistency: Thin; Room service appropriate? Yes  Diet effective now               Brief Narrative: Patient is a 74 y.o. female with PMHx of bronchial asthma, DM-2, HLD, depression who tested positive for COVID-19 on 4/12-subsequently received bamlanivimab\etesevimab infusion on 4/13-presented to the hospital with shortness of breath-found to have COVID-19 pneumonia along with bronchial asthma exacerbation and subsequently admitted to the hospitalist service.  See below for further details.  Significant Events: 4/12>> COVID-19-test done at PCPs office 4/13>>bamlanivimab\etesevimab infusion  4/15>> admit to [facility] for COVID-19 pneumonia and bronchial asthma exacerbation  COVID-19 medications: Steroids: 4/15>> Remdesivir: 4/15>>4/19  Antibiotics: None  Microbiology data: None  Procedures: None  Consults: None  Brief Hospital Course: Acute hypoxic respiratory failure secondary Covid 19 Viral pneumonia: Much improved-not hypoxic-inflammatory markers have almost normalized.  Will complete remdesivir on 4/19, following which she can be discharged home on tapering steroids for ongoing asthma exacerbation.   COVID-19 Labs:  Recent Labs    06/26/19 0811 06/27/19 0436 06/28/19 0239  DDIMER 0.38 0.33 0.35    FERRITIN 257 213 199  CRP 5.6* 2.8* 1.5*    No results found for: SARSCOV2NAA   Bronchial asthma with exacerbation:  Treated with steroids and bronchodilators-much improved today-she is now back to her baseline per patient.  She apparently ambulated without much difficulty.  Her lung sounds a whole lot better-she is moving air much better-hardly any wheezing.  Continue giving steroids on discharge.    Acute metabolic encephalopathy: Secondary to COVID-19-she is completely awake and alert-she is no longer lethargic.  Hypokalemia/hypomagnesemia:  repleted  Depression: Continue Cymbalta-appears stable  Anxiety: Stable-remains in usual dosing of lorazepam.  HLD: Continue fenofibrate  DM-2:  Resume oral hypoglycemic agents on discharge   Discharge Diagnoses:  Active Problems:   GERD with esophagitis   Major depressive disorder   Type 2 diabetes mellitus without complication, without long-term current use of insulin (HCC)   Mild intermittent asthma with (acute) exacerbation   COVID-19 virus infection   Mixed diabetic hyperlipidemia associated with type 2 diabetes mellitus (HCC)   Acute metabolic encephalopathy   Hypokalemia, inadequate intake   Hypomagnesemia   Discharge Instructions:    Person Under Monitoring Name: Nicole [name]  Location: [address] [place] [address]   Infection Prevention Recommendations for Individuals Confirmed to have, or Being Evaluated for, 2019 Novel Coronavirus (COVID-19) Infection Who Receive Care at Home  Individuals who are confirmed to have, or are being evaluated for, COVID-19 should follow the prevention steps below until a healthcare provider or local or state health department says they can return to normal activities.  Stay home except to get medical care You should restrict activities outside  your home, except for getting medical care. Do not go to work, school, or public areas, and do not use public  transportation or taxis.  Call ahead before visiting your doctor Before your medical appointment, call the healthcare provider and tell them that you have, or are being evaluated for, COVID-19 infection. This will help the healthcare provider's office take steps to keep other people from getting infected. Ask your healthcare provider to call the local or state health department.  Monitor your symptoms Seek prompt medical attention if your illness is worsening (e.g., difficulty breathing). Before going to your medical appointment, call the healthcare provider and tell them that you have, or are being evaluated for, COVID-19 infection. Ask your healthcare provider to call the local or state health department.  Wear a facemask You should wear a facemask that covers your nose and mouth when you are in the same room with other people and when you visit a healthcare provider. People who live with or visit you should also wear a facemask while they are in the same room with you.  Separate yourself from other people in your home As much as possible, you should stay in a different room from other people in your home. Also, you should use a separate bathroom, if available.  Avoid sharing household items You should not share dishes, drinking glasses, cups, eating utensils, towels, bedding, or other items with other people in your home. After using these items, you should wash them thoroughly with soap and water.  Cover your coughs and sneezes Cover your mouth and nose with a tissue when you cough or sneeze, or you can cough or sneeze into your sleeve. Throw used tissues in a lined trash can, and immediately wash your hands with soap and water for at least 20 seconds or use an alcohol-based hand rub.  Wash your [facility] your hands often and thoroughly with soap and water for at least 20 seconds. You can use an alcohol-based hand sanitizer if soap and water are not available and if your hands  are not visibly dirty. Avoid touching your eyes, nose, and mouth with unwashed hands.   Prevention Steps for Caregivers and Household Members of Individuals Confirmed to have, or Being Evaluated for, COVID-19 Infection Being Cared for in the Home  If you live with, or provide care at home for, a person confirmed to have, or being evaluated for, COVID-19 infection please follow these guidelines to prevent infection:  Follow healthcare provider's instructions Make sure that you understand and can help the patient follow any healthcare provider instructions for all care.  Provide for the patient's basic needs You should help the patient with basic needs in the home and provide support for getting groceries, prescriptions, and other personal needs.  Monitor the patient's symptoms If they are getting sicker, call his or her medical provider and tell them that the patient has, or is being evaluated for, COVID-19 infection. This will help the healthcare provider's office take steps to keep other people from getting infected. Ask the healthcare provider to call the local or state health department.  Limit the number of people who have contact with the patient  If possible, have only one caregiver for the patient.  Other household members should stay in another home or place of residence. If this is not possible, they should stay  in another room, or be separated from the patient as much as possible. Use a separate bathroom, if available.  Restrict visitors who do  not have an essential need to be in the home.  Keep older adults, very young children, and other sick people away from the patient Keep older adults, very young children, and those who have compromised immune systems or chronic health conditions away from the patient. This includes people with chronic heart, lung, or kidney conditions, diabetes, and cancer.  Ensure good ventilation Make sure that shared spaces in the home have  good air flow, such as from an air conditioner or an opened window, weather permitting.  Wash your hands often  Wash your hands often and thoroughly with soap and water for at least 20 seconds. You can use an alcohol based hand sanitizer if soap and water are not available and if your hands are not visibly dirty.  Avoid touching your eyes, nose, and mouth with unwashed hands.  Use disposable paper towels to dry your hands. If not available, use dedicated cloth towels and replace them when they become wet.  Wear a facemask and gloves  Wear a disposable facemask at all times in the room and gloves when you touch or have contact with the patient's blood, body fluids, and/or secretions or excretions, such as sweat, saliva, sputum, nasal mucus, vomit, urine, or feces.  Ensure the mask fits over your nose and mouth tightly, and do not touch it during use.  Throw out disposable facemasks and gloves after using them. Do not reuse.  Wash your hands immediately after removing your facemask and gloves.  If your personal clothing becomes contaminated, carefully remove clothing and launder. Wash your hands after handling contaminated clothing.  Place all used disposable facemasks, gloves, and other waste in a lined container before disposing them with other household waste.  Remove gloves and wash your hands immediately after handling these items.  Do not share dishes, glasses, or other household items with the patient  Avoid sharing household items. You should not share dishes, drinking glasses, cups, eating utensils, towels, bedding, or other items with a patient who is confirmed to have, or being evaluated for, COVID-19 infection.  After the person uses these items, you should wash them thoroughly with soap and water.  Wash laundry thoroughly  Immediately remove and wash clothes or bedding that have blood, body fluids, and/or secretions or excretions, such as sweat, saliva, sputum, nasal  mucus, vomit, urine, or feces, on them.  Wear gloves when handling laundry from the patient.  Read and follow directions on labels of laundry or clothing items and detergent. In general, wash and dry with the warmest temperatures recommended on the label.  Clean all areas the individual has used often  Clean all touchable surfaces, such as counters, tabletops, doorknobs, bathroom fixtures, toilets, phones, keyboards, tablets, and bedside tables, every day. Also, clean any surfaces that may have blood, body fluids, and/or secretions or excretions on them.  Wear gloves when cleaning surfaces the patient has come in contact with.  Use a diluted bleach solution (e.g., dilute bleach with 1 part bleach and 10 parts water) or a household disinfectant with a label that says EPA-registered for coronaviruses. To make a bleach solution at home, add 1 tablespoon of bleach to 1 quart (4 cups) of water. For a larger supply, add  cup of bleach to 1 gallon (16 cups) of water.  Read labels of cleaning products and follow recommendations provided on product labels. Labels contain instructions for safe and effective use of the cleaning product including precautions you should take when applying the product, such as wearing  gloves or eye protection and making sure you have good ventilation during use of the product.  Remove gloves and wash hands immediately after cleaning.  Monitor yourself for signs and symptoms of illness Caregivers and household members are considered close contacts, should monitor their health, and will be asked to limit movement outside of the home to the extent possible. Follow the monitoring steps for close contacts listed on the symptom monitoring form.   ? If you have additional questions, contact your local health department or call the epidemiologist on call at [phone] (available 24/7). ? This guidance is subject to change. For the most up-to-date guidance from CDC, please  refer to their website: YouBlogs.pl    Activity:  As tolerated   Discharge Instructions    Call MD for:  difficulty breathing, headache or visual disturbances   Complete by: As directed    Call MD for:  extreme fatigue   Complete by: As directed    Call MD for:  persistant dizziness or light-headedness   Complete by: As directed    Call MD for:  redness, tenderness, or signs of infection (pain, swelling, redness, odor or green/yellow discharge around incision site)   Complete by: As directed    Diet - low sodium heart healthy   Complete by: As directed    Diet Carb Modified   Complete by: As directed    Discharge instructions   Complete by: As directed    1.)  3 weeks of isolation from 06/21/2019   Increase activity slowly   Complete by: As directed      Allergies as of 06/28/2019      Reactions   Fish-derived Products Anaphylaxis   Shellfish Allergy Shortness Of Breath   In dye for scan.   Aspirin Nausea Only      Medication List    TAKE these medications   Advair Diskus 250-50 MCG/DOSE Aepb Generic drug: Fluticasone-Salmeterol Inhale 1 puff into the lungs 2 (two) times daily.   aspirin 81 MG tablet Take 81 mg by mouth daily.   benzonatate 200 MG capsule Commonly known as: TESSALON Take 1 capsule (200 mg total) by mouth 3 (three) times daily as needed for cough.   DULoxetine 30 MG capsule Commonly known as: CYMBALTA Take 2 capsules daily What changed:   how much to take  how to take this  when to take this  additional instructions   ergocalciferol 1.25 MG (50000 UT) capsule Commonly known as: VITAMIN D2 Take 50,000 Units by mouth once a week.   fenofibrate 160 MG tablet Take 160 mg by mouth daily.   fluticasone 50 MCG/ACT nasal spray Commonly known as: FLONASE Place 1 spray into both nostrils 2 (two) times daily.   linagliptin 5 MG Tabs tablet Commonly known as: TRADJENTA Take 5 mg  by mouth daily.   loratadine 10 MG tablet Commonly known as: CLARITIN Take 1 tablet (10 mg total) by mouth daily.   LORazepam 1 MG tablet Commonly known as: ATIVAN 1 mg 2 (two) times daily.   NONFORMULARY OR COMPOUNDED ITEM Estradiol 0.02% vaginal cream prefilled applicators apply one twice weekly   OneTouch Delica Lancets 99991111 Misc   OneTouch Verio test strip Generic drug: glucose blood   oxyCODONE-acetaminophen 10-325 MG tablet Commonly known as: PERCOCET Take 1 tablet by mouth 2 (two) times daily as needed for pain.   pantoprazole 40 MG tablet Commonly known as: PROTONIX Take 1 tablet (40 mg total) by mouth 2 (two) times daily.  predniSONE 10 MG tablet Commonly known as: DELTASONE Take 40 mg daily for 1 day, 30 mg daily for 1 day, 20 mg daily for 1 days,10 mg daily for 1 day, then stop   pregabalin 100 MG capsule Commonly known as: Lyrica Take 1 cap twice a day for 2 weeks, then decrease to 1 cap daily for 2 weeks, then stop   ProAir HFA 108 (90 Base) MCG/ACT inhaler Generic drug: albuterol Inhale 2 puffs into the lungs every 4 (four) hours as needed for wheezing or shortness of breath. Uses as rescue inhaler Reported on 06/20/2015 What changed:   how much to take  when to take this  reasons to take this            Durable Medical Equipment  (From admission, onward)         Start     Ordered   06/28/19 0715  For home use only DME Walker rolling  Once    Question Answer Comment  Walker: With [facility]   Patient needs a walker to treat with the following condition Physical deconditioning      06/28/19 0714          Allergies  Allergen Reactions  . Fish-Derived Products Anaphylaxis  . Shellfish Allergy Shortness Of Breath    In dye for scan.  . Aspirin Nausea Only      Other Procedures/Studies: DG Chest Portable 1 View  Result Date: 06/24/2019 CLINICAL DATA:  Shortness of breath and COVID-19 positivity EXAM: PORTABLE CHEST 1 VIEW  COMPARISON:  None. FINDINGS: Cardiac shadows within normal limits. Mild aortic calcifications are seen. Patchy atelectatic changes are noted in the right base. No focal confluent infiltrate is seen. No bony abnormality is noted. IMPRESSION: Patchy right basilar atelectasis Electronically Signed   By: Inez Catalina M.D.   On: 06/24/2019 15:36     TODAY-DAY OF DISCHARGE:  Subjective:   Nicole [name] today has no headache,no chest abdominal pain,no new weakness tingling or numbness, feels much better wants to go home today.   Objective:   Blood pressure (!) 155/89, pulse 83, temperature 98.5 F (36.9 C), temperature source Oral, resp. rate 17, height 5\' 2"  (1.575 m), weight 69.4 kg, SpO2 95 %.  Intake/Output Summary (Last 24 hours) at 06/28/2019 1038 Last data filed at 06/27/2019 1500 Gross per 24 hour  Intake 109.81 ml  Output --  Net 109.81 ml   Filed Weights   06/24/19 1316 06/25/19 0404  Weight: 68 kg 69.4 kg    Exam: Awake Alert, Oriented *3, No new F.N deficits, Normal affect [place].AT,PERRAL Supple Neck,No JVD, No cervical lymphadenopathy appriciated.  Symmetrical Chest wall movement, Good air movement bilaterally, CTAB RRR,No Gallops,Rubs or new Murmurs, No Parasternal Heave +ve B.Sounds, Abd Soft, Non tender, No organomegaly appriciated, No rebound -guarding or rigidity. No Cyanosis, Clubbing or edema, No new Rash or bruise   PERTINENT RADIOLOGIC STUDIES: DG Chest Portable 1 View  Result Date: 06/24/2019 CLINICAL DATA:  Shortness of breath and COVID-19 positivity EXAM: PORTABLE CHEST 1 VIEW COMPARISON:  None. FINDINGS: Cardiac shadows within normal limits. Mild aortic calcifications are seen. Patchy atelectatic changes are noted in the right base. No focal confluent infiltrate is seen. No bony abnormality is noted. IMPRESSION: Patchy right basilar atelectasis Electronically Signed   By: Inez Catalina M.D.   On: 06/24/2019 15:36     PERTINENT LAB RESULTS: CBC: Recent Labs     06/27/19 0436 06/28/19 0239  WBC 6.1 7.0  HGB  12.3 13.1  HCT 36.1 38.1  PLT 224 281   CMET CMP     Component Value Date/Time   NA 140 06/27/2019 0436   K 4.2 06/27/2019 0436   CL 104 06/27/2019 0436   CO2 28 06/27/2019 0436   GLUCOSE 173 (H) 06/27/2019 0436   BUN 11 06/27/2019 0436   CREATININE 0.69 06/27/2019 0436   CALCIUM 9.9 06/27/2019 0436   PROT 6.5 06/27/2019 0436   ALBUMIN 2.8 (L) 06/27/2019 0436   AST 14 (L) 06/27/2019 0436   ALT 19 06/27/2019 0436   ALKPHOS 39 06/27/2019 0436   BILITOT 0.5 06/27/2019 0436   GFRNONAA >60 06/27/2019 0436   GFRAA >60 06/27/2019 0436    GFR Estimated Creatinine Clearance: 58.9 mL/min (by C-G formula based on SCr of 0.69 mg/dL). No results for input(s): LIPASE, AMYLASE in the last 72 hours. No results for input(s): CKTOTAL, CKMB, CKMBINDEX, TROPONINI in the last 72 hours. Invalid input(s): POCBNP Recent Labs    06/27/19 0436 06/28/19 0239  DDIMER 0.33 0.35   No results for input(s): HGBA1C in the last 72 hours. No results for input(s): CHOL, HDL, LDLCALC, TRIG, CHOLHDL, LDLDIRECT in the last 72 hours. No results for input(s): TSH, T4TOTAL, T3FREE, THYROIDAB in the last 72 hours.  Invalid input(s): FREET3 Recent Labs    06/27/19 0436 06/28/19 0239  FERRITIN 213 199   Coags: No results for input(s): INR in the last 72 hours.  Invalid input(s): PT Microbiology: Recent Results (from the past 240 hour(s))  Culture, blood (Routine X 2) w Reflex to ID Panel     Status: None (Preliminary result)   Collection Time: 06/24/19 10:30 PM   Specimen: BLOOD  Result Value Ref Range Status   Specimen Description BLOOD RIGHT ARM  Final   Special Requests   Final    BOTTLES DRAWN AEROBIC AND ANAEROBIC Blood Culture adequate volume   Culture   Final    NO GROWTH 3 DAYS Performed at [facility], [place] [address], [place], [place] [address]    Report Status PENDING  Incomplete  Culture, blood (Routine X 2) w Reflex to ID  Panel     Status: None (Preliminary result)   Collection Time: 06/24/19 10:37 PM   Specimen: BLOOD  Result Value Ref Range Status   Specimen Description BLOOD RIGHT ARM  Final   Special Requests   Final    BOTTLES DRAWN AEROBIC AND ANAEROBIC Blood Culture adequate volume   Culture   Final    NO GROWTH 3 DAYS Performed at [facility], [place] [address] [place], [place] [address]    Report Status PENDING  Incomplete    FURTHER DISCHARGE INSTRUCTIONS:  Get Medicines reviewed and adjusted: Please take all your medications with you for your next visit with your Primary MD  Laboratory/radiological data: Please request your Primary MD to go over all hospital tests and procedure/radiological results at the follow up, please ask your Primary MD to get all Hospital records sent to his/her office.  In some cases, they will be blood work, cultures and biopsy results pending at the time of your discharge. Please request that your primary care M.D. goes through all the records of your hospital data and follows up on these results.  Also Note the following: If you experience worsening of your admission symptoms, develop shortness of breath, life threatening emergency, suicidal or homicidal thoughts you must seek medical attention immediately by calling 911 or calling your MD immediately  if symptoms less severe.  You must read complete instructions/literature along with all the possible adverse reactions/side effects for all the Medicines you take and that have been prescribed to you. Take any new Medicines after you have completely understood and accpet all the possible adverse reactions/side effects.   Do not drive when taking Pain medications or sleeping medications (Benzodaizepines)  Do not take more than prescribed Pain, Sleep and Anxiety Medications. It is not advisable to combine anxiety,sleep and pain medications without talking with your primary care practitioner  Special  Instructions: If you have smoked or chewed Tobacco  in the last 2 yrs please stop smoking, stop any regular Alcohol  and or any Recreational drug use.  Wear Seat belts while driving.  Please note: You were cared for by a hospitalist during your hospital stay. Once you are discharged, your primary care physician will handle any further medical issues. Please note that NO REFILLS for any discharge medications will be authorized once you are discharged, as it is imperative that you return to your primary care physician (or establish a relationship with a primary care physician if you do not have one) for your post hospital discharge needs so that they can reassess your need for medications and monitor your lab values.  Total Time spent coordinating discharge including counseling, education and face to face time equals 35 minutes.  SignedOren Binet 06/28/2019 10:38 AM

## 2019-06-28 NOTE — TOC Transition Note (Addendum)
Transition of Care [facility]) - CM/SW Discharge Note   Patient Details  Name: Nicole [name] MRN: [id number] Date of Birth: Dec 19, 1945  [facility] CM/SW Contact:  Zenon Mayo, RN Phone Number: 06/28/2019, 12:37 PM   Clinical Narrative:    Patient for dc today, needs rolling walker, referral made to [facility] with [employer] , she will bring walker to room prior to dc. Patient has no other needs.  Betsy with [employer] took walker up to room and patient states she does not want it.    Final next level of care: Home/Self Care Barriers to Discharge: No Barriers Identified   Patient Goals and CMS Choice   CMS Medicare.gov Compare Post Acute Care list provided to:: Patient Choice offered to / list presented to : NA  Discharge Placement                       Discharge Plan and Services                DME Arranged: Walker rolling DME Agency: [employer] Date DME Agency Contacted: 06/28/19 Time DME Agency Contacted: 1237 Representative spoke with at DME Agency: [place] Interventions     Readmission Risk Interventions No flowsheet data found.

## 2019-06-28 NOTE — Discharge Instructions (Signed)
1.)  3 weeks of isolation from 06/21/2019     Person Under Monitoring Name: Nicole [name]  Location: [address] [place] [address]   Infection Prevention Recommendations for Individuals Confirmed to have, or Being Evaluated for, 2019 Novel Coronavirus (COVID-19) Infection Who Receive Care at Home  Individuals who are confirmed to have, or are being evaluated for, COVID-19 should follow the prevention steps below until a healthcare provider or local or state health department says they can return to normal activities.  Stay home except to get medical care You should restrict activities outside your home, except for getting medical care. Do not go to work, school, or public areas, and do not use public transportation or taxis.  Call ahead before visiting your doctor Before your medical appointment, call the healthcare provider and tell them that you have, or are being evaluated for, COVID-19 infection. This will help the healthcare provider's office take steps to keep other people from getting infected. Ask your healthcare provider to call the local or state health department.  Monitor your symptoms Seek prompt medical attention if your illness is worsening (e.g., difficulty breathing). Before going to your medical appointment, call the healthcare provider and tell them that you have, or are being evaluated for, COVID-19 infection. Ask your healthcare provider to call the local or state health department.  Wear a facemask You should wear a facemask that covers your nose and mouth when you are in the same room with other people and when you visit a healthcare provider. People who live with or visit you should also wear a facemask while they are in the same room with you.  Separate yourself from other people in your home As much as possible, you should stay in a different room from other people in your home. Also, you should use a separate bathroom, if available.  Avoid  sharing household items You should not share dishes, drinking glasses, cups, eating utensils, towels, bedding, or other items with other people in your home. After using these items, you should wash them thoroughly with soap and water.  Cover your coughs and sneezes Cover your mouth and nose with a tissue when you cough or sneeze, or you can cough or sneeze into your sleeve. Throw used tissues in a lined trash can, and immediately wash your hands with soap and water for at least 20 seconds or use an alcohol-based hand rub.  Wash your [facility] your hands often and thoroughly with soap and water for at least 20 seconds. You can use an alcohol-based hand sanitizer if soap and water are not available and if your hands are not visibly dirty. Avoid touching your eyes, nose, and mouth with unwashed hands.   Prevention Steps for Caregivers and Household Members of Individuals Confirmed to have, or Being Evaluated for, COVID-19 Infection Being Cared for in the Home  If you live with, or provide care at home for, a person confirmed to have, or being evaluated for, COVID-19 infection please follow these guidelines to prevent infection:  Follow healthcare provider's instructions Make sure that you understand and can help the patient follow any healthcare provider instructions for all care.  Provide for the patient's basic needs You should help the patient with basic needs in the home and provide support for getting groceries, prescriptions, and other personal needs.  Monitor the patient's symptoms If they are getting sicker, call his or her medical provider and tell them that the patient has, or is being  evaluated for, COVID-19 infection. This will help the healthcare provider's office take steps to keep other people from getting infected. Ask the healthcare provider to call the local or state health department.  Limit the number of people who have contact with the patient  If possible, have  only one caregiver for the patient.  Other household members should stay in another home or place of residence. If this is not possible, they should stay  in another room, or be separated from the patient as much as possible. Use a separate bathroom, if available.  Restrict visitors who do not have an essential need to be in the home.  Keep older adults, very young children, and other sick people away from the patient Keep older adults, very young children, and those who have compromised immune systems or chronic health conditions away from the patient. This includes people with chronic heart, lung, or kidney conditions, diabetes, and cancer.  Ensure good ventilation Make sure that shared spaces in the home have good air flow, such as from an air conditioner or an opened window, weather permitting.  Wash your hands often  Wash your hands often and thoroughly with soap and water for at least 20 seconds. You can use an alcohol based hand sanitizer if soap and water are not available and if your hands are not visibly dirty.  Avoid touching your eyes, nose, and mouth with unwashed hands.  Use disposable paper towels to dry your hands. If not available, use dedicated cloth towels and replace them when they become wet.  Wear a facemask and gloves  Wear a disposable facemask at all times in the room and gloves when you touch or have contact with the patient's blood, body fluids, and/or secretions or excretions, such as sweat, saliva, sputum, nasal mucus, vomit, urine, or feces.  Ensure the mask fits over your nose and mouth tightly, and do not touch it during use.  Throw out disposable facemasks and gloves after using them. Do not reuse.  Wash your hands immediately after removing your facemask and gloves.  If your personal clothing becomes contaminated, carefully remove clothing and launder. Wash your hands after handling contaminated clothing.  Place all used disposable facemasks, gloves,  and other waste in a lined container before disposing them with other household waste.  Remove gloves and wash your hands immediately after handling these items.  Do not share dishes, glasses, or other household items with the patient  Avoid sharing household items. You should not share dishes, drinking glasses, cups, eating utensils, towels, bedding, or other items with a patient who is confirmed to have, or being evaluated for, COVID-19 infection.  After the person uses these items, you should wash them thoroughly with soap and water.  Wash laundry thoroughly  Immediately remove and wash clothes or bedding that have blood, body fluids, and/or secretions or excretions, such as sweat, saliva, sputum, nasal mucus, vomit, urine, or feces, on them.  Wear gloves when handling laundry from the patient.  Read and follow directions on labels of laundry or clothing items and detergent. In general, wash and dry with the warmest temperatures recommended on the label.  Clean all areas the individual has used often  Clean all touchable surfaces, such as counters, tabletops, doorknobs, bathroom fixtures, toilets, phones, keyboards, tablets, and bedside tables, every day. Also, clean any surfaces that may have blood, body fluids, and/or secretions or excretions on them.  Wear gloves when cleaning surfaces the patient has come in contact with.  Use a diluted bleach solution (e.g., dilute bleach with 1 part bleach and 10 parts water) or a household disinfectant with a label that says EPA-registered for coronaviruses. To make a bleach solution at home, add 1 tablespoon of bleach to 1 quart (4 cups) of water. For a larger supply, add  cup of bleach to 1 gallon (16 cups) of water.  Read labels of cleaning products and follow recommendations provided on product labels. Labels contain instructions for safe and effective use of the cleaning product including precautions you should take when applying the  product, such as wearing gloves or eye protection and making sure you have good ventilation during use of the product.  Remove gloves and wash hands immediately after cleaning.  Monitor yourself for signs and symptoms of illness Caregivers and household members are considered close contacts, should monitor their health, and will be asked to limit movement outside of the home to the extent possible. Follow the monitoring steps for close contacts listed on the symptom monitoring form.   ? If you have additional questions, contact your local health department or call the epidemiologist on call at [phone] (available 24/7). ? This guidance is subject to change. For the most up-to-date guidance from [facility], please refer to their website: [email or web address]

## 2019-06-29 LAB — CULTURE, BLOOD (ROUTINE X 2)
Culture: NO GROWTH
Culture: NO GROWTH
Special Requests: ADEQUATE
Special Requests: ADEQUATE

## 2019-10-16 ENCOUNTER — Emergency Department (HOSPITAL_COMMUNITY): Payer: Medicare Other

## 2019-10-16 ENCOUNTER — Inpatient Hospital Stay (HOSPITAL_COMMUNITY)
Admission: EM | Admit: 2019-10-16 | Discharge: 2019-10-22 | DRG: 246 | Disposition: A | Discharge status: Home / Self Care | Payer: Medicare Other | Attending: Internal Medicine | Admitting: Internal Medicine

## 2019-10-16 ENCOUNTER — Encounter (HOSPITAL_COMMUNITY): Payer: Self-pay | Admitting: Emergency Medicine

## 2019-10-16 ENCOUNTER — Other Ambulatory Visit: Payer: Self-pay

## 2019-10-16 DIAGNOSIS — K2100 Gastro-esophageal reflux disease with esophagitis, without bleeding: Secondary | ICD-10-CM | POA: Diagnosis present

## 2019-10-16 DIAGNOSIS — R519 Headache, unspecified: Secondary | ICD-10-CM | POA: Diagnosis present

## 2019-10-16 DIAGNOSIS — I255 Ischemic cardiomyopathy: Secondary | ICD-10-CM | POA: Diagnosis present

## 2019-10-16 DIAGNOSIS — Z6828 Body mass index (BMI) 28.0-28.9, adult: Secondary | ICD-10-CM | POA: Diagnosis not present

## 2019-10-16 DIAGNOSIS — Z79891 Long term (current) use of opiate analgesic: Secondary | ICD-10-CM

## 2019-10-16 DIAGNOSIS — Z91040 Latex allergy status: Secondary | ICD-10-CM

## 2019-10-16 DIAGNOSIS — I429 Cardiomyopathy, unspecified: Secondary | ICD-10-CM

## 2019-10-16 DIAGNOSIS — B948 Sequelae of other specified infectious and parasitic diseases: Secondary | ICD-10-CM

## 2019-10-16 DIAGNOSIS — J4520 Mild intermittent asthma, uncomplicated: Secondary | ICD-10-CM | POA: Diagnosis present

## 2019-10-16 DIAGNOSIS — Z7982 Long term (current) use of aspirin: Secondary | ICD-10-CM

## 2019-10-16 DIAGNOSIS — R778 Other specified abnormalities of plasma proteins: Secondary | ICD-10-CM | POA: Diagnosis not present

## 2019-10-16 DIAGNOSIS — E876 Hypokalemia: Secondary | ICD-10-CM | POA: Diagnosis not present

## 2019-10-16 DIAGNOSIS — E1169 Type 2 diabetes mellitus with other specified complication: Secondary | ICD-10-CM | POA: Diagnosis present

## 2019-10-16 DIAGNOSIS — I2510 Atherosclerotic heart disease of native coronary artery without angina pectoris: Secondary | ICD-10-CM

## 2019-10-16 DIAGNOSIS — Z91013 Allergy to seafood: Secondary | ICD-10-CM

## 2019-10-16 DIAGNOSIS — G9341 Metabolic encephalopathy: Secondary | ICD-10-CM | POA: Diagnosis not present

## 2019-10-16 DIAGNOSIS — E785 Hyperlipidemia, unspecified: Secondary | ICD-10-CM | POA: Diagnosis present

## 2019-10-16 DIAGNOSIS — F329 Major depressive disorder, single episode, unspecified: Secondary | ICD-10-CM | POA: Diagnosis present

## 2019-10-16 DIAGNOSIS — Z79899 Other long term (current) drug therapy: Secondary | ICD-10-CM | POA: Diagnosis not present

## 2019-10-16 DIAGNOSIS — R0602 Shortness of breath: Secondary | ICD-10-CM | POA: Diagnosis not present

## 2019-10-16 DIAGNOSIS — R0902 Hypoxemia: Secondary | ICD-10-CM

## 2019-10-16 DIAGNOSIS — Z20822 Contact with and (suspected) exposure to covid-19: Secondary | ICD-10-CM | POA: Diagnosis present

## 2019-10-16 DIAGNOSIS — Z8701 Personal history of pneumonia (recurrent): Secondary | ICD-10-CM | POA: Diagnosis not present

## 2019-10-16 DIAGNOSIS — I428 Other cardiomyopathies: Secondary | ICD-10-CM | POA: Diagnosis present

## 2019-10-16 DIAGNOSIS — I361 Nonrheumatic tricuspid (valve) insufficiency: Secondary | ICD-10-CM | POA: Diagnosis not present

## 2019-10-16 DIAGNOSIS — Z9861 Coronary angioplasty status: Secondary | ICD-10-CM

## 2019-10-16 DIAGNOSIS — I110 Hypertensive heart disease with heart failure: Secondary | ICD-10-CM | POA: Diagnosis present

## 2019-10-16 DIAGNOSIS — Z8249 Family history of ischemic heart disease and other diseases of the circulatory system: Secondary | ICD-10-CM

## 2019-10-16 DIAGNOSIS — Z886 Allergy status to analgesic agent status: Secondary | ICD-10-CM

## 2019-10-16 DIAGNOSIS — F419 Anxiety disorder, unspecified: Secondary | ICD-10-CM | POA: Diagnosis present

## 2019-10-16 DIAGNOSIS — R0600 Dyspnea, unspecified: Secondary | ICD-10-CM | POA: Diagnosis present

## 2019-10-16 DIAGNOSIS — E669 Obesity, unspecified: Secondary | ICD-10-CM | POA: Diagnosis present

## 2019-10-16 DIAGNOSIS — Z7951 Long term (current) use of inhaled steroids: Secondary | ICD-10-CM

## 2019-10-16 DIAGNOSIS — R4701 Aphasia: Secondary | ICD-10-CM | POA: Diagnosis not present

## 2019-10-16 DIAGNOSIS — R0609 Other forms of dyspnea: Secondary | ICD-10-CM | POA: Diagnosis present

## 2019-10-16 DIAGNOSIS — Z955 Presence of coronary angioplasty implant and graft: Secondary | ICD-10-CM

## 2019-10-16 DIAGNOSIS — Z833 Family history of diabetes mellitus: Secondary | ICD-10-CM

## 2019-10-16 DIAGNOSIS — I5041 Acute combined systolic (congestive) and diastolic (congestive) heart failure: Secondary | ICD-10-CM | POA: Diagnosis present

## 2019-10-16 DIAGNOSIS — R7989 Other specified abnormal findings of blood chemistry: Secondary | ICD-10-CM

## 2019-10-16 DIAGNOSIS — Z91041 Radiographic dye allergy status: Secondary | ICD-10-CM

## 2019-10-16 DIAGNOSIS — F32A Depression, unspecified: Secondary | ICD-10-CM | POA: Diagnosis present

## 2019-10-16 DIAGNOSIS — M797 Fibromyalgia: Secondary | ICD-10-CM | POA: Diagnosis present

## 2019-10-16 DIAGNOSIS — I340 Nonrheumatic mitral (valve) insufficiency: Secondary | ICD-10-CM | POA: Diagnosis not present

## 2019-10-16 DIAGNOSIS — Z87892 Personal history of anaphylaxis: Secondary | ICD-10-CM

## 2019-10-16 LAB — CBC WITH DIFFERENTIAL/PLATELET
Abs Immature Granulocytes: 0.06 10*3/uL (ref 0.00–0.07)
Basophils Absolute: 0 10*3/uL (ref 0.0–0.1)
Basophils Relative: 0 %
Eosinophils Absolute: 0.2 10*3/uL (ref 0.0–0.5)
Eosinophils Relative: 2 %
HCT: 38.1 % (ref 36.0–46.0)
Hemoglobin: 12.5 g/dL (ref 12.0–15.0)
Immature Granulocytes: 1 %
Lymphocytes Relative: 25 %
Lymphs Abs: 2.4 10*3/uL (ref 0.7–4.0)
MCH: 30.5 pg (ref 26.0–34.0)
MCHC: 32.8 g/dL (ref 30.0–36.0)
MCV: 92.9 fL (ref 80.0–100.0)
Monocytes Absolute: 0.5 10*3/uL (ref 0.1–1.0)
Monocytes Relative: 6 %
Neutro Abs: 6.5 10*3/uL (ref 1.7–7.7)
Neutrophils Relative %: 66 %
Platelets: 227 10*3/uL (ref 150–400)
RBC: 4.1 MIL/uL (ref 3.87–5.11)
RDW: 13.1 % (ref 11.5–15.5)
WBC: 9.7 10*3/uL (ref 4.0–10.5)
nRBC: 0 % (ref 0.0–0.2)

## 2019-10-16 LAB — BASIC METABOLIC PANEL
Anion gap: 12 (ref 5–15)
BUN: 7 mg/dL — ABNORMAL LOW (ref 8–23)
CO2: 31 mmol/L (ref 22–32)
Calcium: 8.8 mg/dL — ABNORMAL LOW (ref 8.9–10.3)
Chloride: 102 mmol/L (ref 98–111)
Creatinine, Ser: 0.69 mg/dL (ref 0.44–1.00)
GFR calc Af Amer: >60 mL/min (ref >60)
GFR calc non Af Amer: >60 mL/min (ref >60)
Glucose, Bld: 151 mg/dL — ABNORMAL HIGH (ref 70–99)
Potassium: 3.6 mmol/L (ref 3.5–5.1)
Sodium: 145 mmol/L (ref 135–145)

## 2019-10-16 LAB — PROTIME-INR
INR: 1 (ref 0.8–1.2)
Prothrombin Time: 12.8 seconds (ref 11.4–15.2)

## 2019-10-16 LAB — TROPONIN I (HIGH SENSITIVITY): Troponin I (High Sensitivity): 26 ng/L — ABNORMAL HIGH (ref <18)

## 2019-10-16 LAB — BRAIN NATRIURETIC PEPTIDE: B Natriuretic Peptide: 504.9 pg/mL — ABNORMAL HIGH (ref 0.0–100.0)

## 2019-10-16 LAB — SARS CORONAVIRUS 2 BY RT PCR (HOSPITAL ORDER, PERFORMED IN ~~LOC~~ HOSPITAL LAB): SARS Coronavirus 2: NEGATIVE

## 2019-10-16 LAB — D-DIMER, QUANTITATIVE: D-Dimer, Quant: 0.61 ug/mL-FEU — ABNORMAL HIGH (ref 0.00–0.50)

## 2019-10-16 MED ORDER — OXYCODONE HCL 5 MG PO TABS
5.0000 mg | ORAL_TABLET | Freq: Two times a day (BID) | ORAL | Status: DC | PRN
  Administered 2019-10-17 – 2019-10-20 (×4): 5 mg via ORAL
  Filled 2019-10-16 (×4): qty 1

## 2019-10-16 MED ORDER — SODIUM CHLORIDE 0.9 % IV SOLN: 250.0000 mL | INTRAVENOUS | Status: DC | PRN

## 2019-10-16 MED ORDER — POTASSIUM CHLORIDE CRYS ER 20 MEQ PO TBCR
40.0000 meq | EXTENDED_RELEASE_TABLET | Freq: Once | ORAL | Status: AC
  Administered 2019-10-16: 40 meq via ORAL
  Filled 2019-10-16: qty 2

## 2019-10-16 MED ORDER — INSULIN ASPART 100 UNIT/ML ~~LOC~~ SOLN
0.0000 [IU] | Freq: Three times a day (TID) | SUBCUTANEOUS | Status: DC
  Administered 2019-10-17: 3 [IU] via SUBCUTANEOUS
  Administered 2019-10-17 – 2019-10-18 (×2): 2 [IU] via SUBCUTANEOUS
  Administered 2019-10-18: 3 [IU] via SUBCUTANEOUS
  Administered 2019-10-18 – 2019-10-19 (×2): 2 [IU] via SUBCUTANEOUS
  Administered 2019-10-19: 5 [IU] via SUBCUTANEOUS
  Administered 2019-10-20: 3 [IU] via SUBCUTANEOUS
  Administered 2019-10-20: 2 [IU] via SUBCUTANEOUS
  Administered 2019-10-21 (×2): 3 [IU] via SUBCUTANEOUS
  Administered 2019-10-22: 2 [IU] via SUBCUTANEOUS
  Filled 2019-10-16: qty 0.15

## 2019-10-16 MED ORDER — PANTOPRAZOLE SODIUM 40 MG PO TBEC
40.0000 mg | DELAYED_RELEASE_TABLET | Freq: Two times a day (BID) | ORAL | Status: DC
  Administered 2019-10-17 – 2019-10-18 (×4): 40 mg via ORAL
  Filled 2019-10-16 (×4): qty 1

## 2019-10-16 MED ORDER — SODIUM CHLORIDE 0.9% FLUSH: 3.0000 mL | INTRAVENOUS | Status: DC | PRN

## 2019-10-16 MED ORDER — SODIUM CHLORIDE 0.9% FLUSH
3.0000 mL | Freq: Two times a day (BID) | INTRAVENOUS | Status: DC
  Administered 2019-10-17 – 2019-10-20 (×8): 3 mL via INTRAVENOUS

## 2019-10-16 MED ORDER — ENOXAPARIN SODIUM 40 MG/0.4ML ~~LOC~~ SOLN
40.0000 mg | SUBCUTANEOUS | Status: DC
  Administered 2019-10-17 – 2019-10-20 (×4): 40 mg via SUBCUTANEOUS
  Filled 2019-10-16 (×4): qty 0.4

## 2019-10-16 MED ORDER — FUROSEMIDE 10 MG/ML IJ SOLN
40.0000 mg | Freq: Once | INTRAMUSCULAR | Status: AC
  Administered 2019-10-16: 40 mg via INTRAVENOUS
  Filled 2019-10-16: qty 4

## 2019-10-16 MED ORDER — OXYCODONE-ACETAMINOPHEN 10-325 MG PO TABS: 1.0000 | ORAL_TABLET | Freq: Two times a day (BID) | ORAL | Status: DC | PRN

## 2019-10-16 MED ORDER — FUROSEMIDE 40 MG PO TABS: 40.0000 mg | ORAL_TABLET | Freq: Every day | ORAL | Status: DC

## 2019-10-16 MED ORDER — ASPIRIN 81 MG PO CHEW
81.0000 mg | CHEWABLE_TABLET | Freq: Every day | ORAL | Status: DC
  Administered 2019-10-17 – 2019-10-22 (×5): 81 mg via ORAL
  Filled 2019-10-16 (×5): qty 1

## 2019-10-16 MED ORDER — ALBUTEROL SULFATE (2.5 MG/3ML) 0.083% IN NEBU
2.5000 mg | INHALATION_SOLUTION | RESPIRATORY_TRACT | Status: DC | PRN
  Administered 2019-10-20: 2.5 mg via RESPIRATORY_TRACT
  Filled 2019-10-16: qty 3

## 2019-10-16 MED ORDER — DULOXETINE HCL 60 MG PO CPEP
60.0000 mg | ORAL_CAPSULE | Freq: Every day | ORAL | Status: DC
  Administered 2019-10-17 – 2019-10-22 (×5): 60 mg via ORAL
  Filled 2019-10-16 (×5): qty 1

## 2019-10-16 MED ORDER — OXYCODONE-ACETAMINOPHEN 5-325 MG PO TABS
1.0000 | ORAL_TABLET | Freq: Two times a day (BID) | ORAL | Status: DC | PRN
  Administered 2019-10-17 – 2019-10-21 (×8): 1 via ORAL
  Filled 2019-10-16 (×8): qty 1

## 2019-10-16 MED ORDER — MOMETASONE FURO-FORMOTEROL FUM 200-5 MCG/ACT IN AERO
2.0000 | INHALATION_SPRAY | Freq: Two times a day (BID) | RESPIRATORY_TRACT | Status: DC
  Administered 2019-10-17 – 2019-10-22 (×11): 2 via RESPIRATORY_TRACT
  Filled 2019-10-16 (×2): qty 8.8

## 2019-10-16 MED ORDER — LORAZEPAM 1 MG PO TABS
1.0000 mg | ORAL_TABLET | Freq: Two times a day (BID) | ORAL | Status: DC
  Administered 2019-10-17 – 2019-10-22 (×11): 1 mg via ORAL
  Filled 2019-10-16 (×11): qty 1

## 2019-10-16 MED ORDER — FENOFIBRATE 160 MG PO TABS
160.0000 mg | ORAL_TABLET | Freq: Every day | ORAL | Status: DC
  Administered 2019-10-17 – 2019-10-21 (×5): 160 mg via ORAL
  Filled 2019-10-16 (×5): qty 1

## 2019-10-16 MED ORDER — METOPROLOL TARTRATE 25 MG PO TABS
25.0000 mg | ORAL_TABLET | Freq: Two times a day (BID) | ORAL | Status: DC
  Administered 2019-10-17 – 2019-10-18 (×4): 25 mg via ORAL
  Filled 2019-10-16 (×4): qty 1

## 2019-10-16 MED ORDER — FLUTICASONE PROPIONATE 50 MCG/ACT NA SUSP
1.0000 | Freq: Every day | NASAL | Status: DC | PRN
  Filled 2019-10-16: qty 16

## 2019-10-16 NOTE — ED Notes (Signed)
Admitting provider at bedside.

## 2019-10-16 NOTE — ED Notes (Signed)
Walked patient from triage to room 22 patient O2 stat drop to 92 for a second then went back up to 98%.  I made MD Nanaviti aware

## 2019-10-16 NOTE — ED Notes (Signed)
Assumed care of patient at this time, nad noted, sr up x2, bed locked and low, call bell w/I reach.  Will continue to monitor. ° °

## 2019-10-16 NOTE — H&P (Signed)
History and Physical    Nicole [name] TMH:[id number] DOB: 1945-10-05 DOA: 10/16/2019  PCP: [name], MD  Patient coming from: home  I have personally briefly reviewed patient's old medical records in [place]  Chief Complaint: SOB/DOE  HPI: Nicole [name] is a 74 y.o. female with medical history significant of fibromyalgia, GERD, DM. She was recently hospitalized with Covid 19 PNA. She presents to [facility] for evaluation of increasing SOB and DOE along with weakness. She has no prior cardiac hx.  .  ED Course: Afebrile, BP 150/110, HR 108. EKG reveals sinus tachycardia, LAE and possible lateral injury. Lab reveals Covid negative, BNP 504.9, Glucose 151 otherwise normal labs. CXR with cardiomegaly, new sine April '21. D-dimer normal  Review of Systems: As per HPI otherwise 10 point review of systems negative.    Past Medical History:  Diagnosis Date  . Anemia   . Anxiety   . Back pain   . Bloating   . Blurred vision    occasional  . Chest pain   . Colon polyps   . Dizziness   . Endometriosis    history of  . Fatigue   . Fibromyalgia   . Forgetfulness   . GERD with esophagitis    Follows with Dr. Fuller Plan with GI, EGD in the past  . Headache    migraines  . Joint pain   . Leg weakness   . Major depressive disorder   . Mild intermittent asthma, uncomplicated   . Mixed diabetic hyperlipidemia associated with type 2 diabetes mellitus ([place])   . Obesity   . SOB (shortness of breath)   . Type 2 diabetes mellitus without complication, without long-term current use of insulin ([place])   . Urinary incontinence   . Vaginitis     Past Surgical History:  Procedure Laterality Date  . ABDOMINAL HYSTERECTOMY     TAH BSO  . APPENDECTOMY  1999  . CATARACT EXTRACTION     both  . CHOLECYSTECTOMY    . DENTAL SURGERY    . HERNIA REPAIR  2000  . LAPAROSCOPIC ENDOMETRIOSIS FULGURATION      Soc Hx -  Married then divorced. One daughter, 2 grandchildren. Lives alone. Retired  from work [employer] for [employer].    reports that she has never smoked. She has never used smokeless tobacco. She reports current alcohol use. She reports that she does not use drugs.  Allergies  Allergen Reactions  . Fish-Derived Products Anaphylaxis  . Shellfish Allergy Shortness Of Breath    In dye for scan.  . Aspirin Nausea Only and Other (See Comments)    Pt stated it only gives her problems when she takes the high dose (325 mg)  . Latex Other (See Comments)    Pt says she gets a "stinky infection"    Family History  Problem Relation Age of Onset  . Hypertension Mother   . Heart failure Mother   . Diabetes Mother   . Hypertension Father   . Heart failure Father   . Diabetes Father   . Heart attack Father   . Stroke Father   . Cancer Brother        lung  . Thyroid disease Brother   . Cancer Brother        throat  . Cancer Paternal Aunt        bone  . Cancer Paternal Uncle        lung  .  Hypertension Maternal Grandmother   . Hypertension Maternal Grandfather   . Hypertension Paternal Grandmother   . Hypertension Paternal Grandfather   . Diabetes Daughter   . Colon cancer Neg Hx   . Stomach cancer Neg Hx      Prior to Admission medications   Medication Sig Start Date End Date Taking? Authorizing Provider  Ascorbic Acid (VITAMIN C PO) Take 1 tablet by mouth daily.   Yes [provider]  aspirin 81 MG tablet Take 81 mg by mouth daily.   Yes [provider]  DULoxetine (CYMBALTA) 60 MG capsule Take 60 mg by mouth daily. 08/10/19  Yes [provider]  ergocalciferol (VITAMIN D2) 50000 UNITS capsule Take 50,000 Units by mouth once a week.     Yes [provider]  fenofibrate 160 MG tablet Take 160 mg by mouth at bedtime.    Yes [provider]  fluticasone (FLONASE) 50 MCG/ACT nasal spray Place 1 spray into both nostrils daily as needed for allergies or rhinitis.  05/28/19  Yes [provider]  Fluticasone-Salmeterol (ADVAIR DISKUS) 250-50 MCG/DOSE AEPB Inhale 1 puff into the lungs 2 (two) times daily.   Yes [provider]  LORazepam (ATIVAN) 1 MG tablet Take 1 mg by mouth 2 (two) times daily.    Yes [provider]  oxyCODONE-acetaminophen (PERCOCET) 10-325 MG per tablet Take 1 tablet by mouth 2 (two) times daily as needed for pain.    Yes [provider]  pantoprazole (PROTONIX) 40 MG tablet Take 1 tablet (40 mg total) by mouth 2 (two) times daily. 08/14/11  Yes Ladene Artist, MD  PROAIR HFA 108 [phone] Base) MCG/ACT inhaler Inhale 2 puffs into the lungs every 4 (four) hours as needed for wheezing or shortness of breath. Uses as rescue inhaler Reported on 06/20/2015 Patient taking differently: Inhale 2 puffs into the lungs every 4 (four) hours as needed for wheezing or shortness of breath. Uses as rescue inhaler Reported on 10/16/2019 06/28/19  Yes Ghimire, Henreitta Leber, MD  benzonatate (TESSALON) 200 MG capsule Take 1 capsule (200 mg total) by mouth 3 (three) times daily as needed for cough. Patient not taking: Reported on 10/16/2019 06/28/19   Jonetta Osgood, MD  DULoxetine (CYMBALTA) 30 MG capsule Take 2 capsules daily Patient not taking: Reported on 10/16/2019 07/10/16   Cameron Sprang, MD  loratadine (CLARITIN) 10 MG tablet Take 1 tablet (10 mg total) by mouth daily. Patient not taking: Reported on 10/16/2019 06/28/19   Jonetta Osgood, MD  NONFORMULARY OR COMPOUNDED ITEM Estradiol 0.02% vaginal cream prefilled applicators apply one twice weekly Patient not taking: Reported on 10/16/2019 05/23/15   Fontaine, Belinda Block, MD  [facility] LANCETS 40J [place]  06/03/16   [provider]  [facility] VERIO test strip  06/03/16   [provider]  predniSONE (DELTASONE) 10 MG tablet Take 40 mg daily for 1 day, 30 mg daily for 1 day, 20 mg daily for 1 days,10 mg daily for 1 day, then stop Patient not taking: Reported on 10/16/2019 06/28/19   Jonetta Osgood, MD  predniSONE (STERAPRED UNI-PAK 21 TAB) 5 MG (21) TBPK tablet Take by mouth as directed. Patient not taking: Reported on 10/16/2019 09/30/19   [provider]  pregabalin (LYRICA) 100 MG capsule Take 1 cap twice a day for 2 weeks, then decrease to 1 cap daily for 2 weeks, then stop Patient not taking: Reported on 10/16/2019 07/18/17   Cameron Sprang, MD  Physical Exam: Vitals:   10/16/19 1125 10/16/19 1748 10/16/19 1942 10/16/19 2213  BP: (!) 134/102 (!) 166/106 (!) 163/98 (!) 150/110  Pulse: (!) 103 (!) 112 (!) 114 (!) 108  Resp: 19 16 (!) 24 14  Temp: 98.9 F (37.2 C)     TempSrc: Oral     SpO2: 96% 92% 97% 95%    Constitutional: NAD, calm, comfortable Vitals:   10/16/19 1125 10/16/19 1748 10/16/19 1942 10/16/19 2213  BP: (!) 134/102 (!) 166/106 (!) 163/98 (!) 150/110  Pulse: (!) 103 (!) 112 (!) 114 (!) 108  Resp: 19 16 (!) 24 14  Temp: 98.9 F (37.2 C)     TempSrc: Oral     SpO2: 96% 92% 97% 95%   General:- overweight woman in no distress Eyes: PERRL, lids and conjunctivae normal ENMT: Mucous membranes are moist. Posterior pharynx clear of any exudate or lesions.dentition in poor condition.  Neck: normal, supple, no masses, no thyromegaly, No JVD Respiratory: clear to auscultation bilaterally, no wheezing, no crackles. Normal respiratory effort. No accessory muscle use.  Cardiovascular: Regular tachycardia, no murmurs / rubs / gallops. No extremity edema. 2+ pedal pulses. No carotid bruits.  Abdomen: obese. no tenderness, no masses palpated. No hepatosplenomegaly. Bowel sounds positive.  Musculoskeletal: no clubbing / cyanosis. No joint deformity upper and lower extremities. Good ROM, no contractures. Normal muscle tone.  Skin: no rashes, lesions, ulcers. No induration Neurologic: CN 2-12 grossly intact. Sensation intact, DTR normal. Strength 5/5 in all 4.  Psychiatric: Normal judgment and insight. Alert and oriented x 3. Normal mood.     Labs on  Admission: I have personally reviewed following labs and imaging studies  CBC: Recent Labs  Lab 10/16/19 1615  WBC 9.7  NEUTROABS 6.5  HGB 12.5  HCT 38.1  MCV 92.9  PLT 557   Basic Metabolic Panel: Recent Labs  Lab 10/16/19 1615  NA 145  K 3.6  CL 102  CO2 31  GLUCOSE 151*  BUN 7*  CREATININE 0.69  CALCIUM 8.8*   GFR: CrCl cannot be calculated (Unknown ideal weight.). Liver Function Tests: No results for input(s): AST, ALT, ALKPHOS, BILITOT, PROT, ALBUMIN in the last 168 hours. No results for input(s): LIPASE, AMYLASE in the last 168 hours. No results for input(s): AMMONIA in the last 168 hours. Coagulation Profile: Recent Labs  Lab 10/16/19 1615  INR 1.0   Cardiac Enzymes: No results for input(s): CKTOTAL, CKMB, CKMBINDEX, TROPONINI in the last 168 hours. BNP (last 3 results) No results for input(s): PROBNP in the last 8760 hours. HbA1C: No results for input(s): HGBA1C in the last 72 hours. CBG: No results for input(s): GLUCAP in the last 168 hours. Lipid Profile: No results for input(s): CHOL, HDL, LDLCALC, TRIG, CHOLHDL, LDLDIRECT in the last 72 hours. Thyroid Function Tests: No results for input(s): TSH, T4TOTAL, FREET4, T3FREE, THYROIDAB in the last 72 hours. Anemia Panel: No results for input(s): VITAMINB12, FOLATE, FERRITIN, TIBC, IRON, RETICCTPCT in the last 72 hours. Urine analysis:    Component Value Date/Time   COLORURINE YELLOW 02/23/2014 1528   APPEARANCEUR CLEAR 02/23/2014 1528   LABSPEC 1.010 02/23/2014 1528   PHURINE 6.0 02/23/2014 1528   GLUCOSEU NEG 02/23/2014 1528   HGBUR NEG 02/23/2014 1528   BILIRUBINUR NEG 02/23/2014 1528   KETONESUR NEG 02/23/2014 1528   PROTEINUR NEG 02/23/2014 1528   UROBILINOGEN 0.2 02/23/2014 1528   NITRITE NEG 02/23/2014 1528   LEUKOCYTESUR TRACE (A) 02/23/2014 1528    Radiological Exams on Admission:  DG Chest 2 View  Result Date: 10/16/2019 CLINICAL DATA:  Cough. Cough for 2 weeks. Patient was  diagnosed with Covid-19 in April. EXAM: CHEST - 2 VIEW COMPARISON:  In 06/24/2019 FINDINGS: The heart is enlarged but stable in configuration. There are bilateral pleural effusions. There is mild perihilar increased opacity and interstitial lines suggestive of early edema. Infectious process could have a similar appearance. There is increased bibasilar atelectasis. IMPRESSION: Stable cardiomegaly. Early edema versus inflammatory process. Small bilateral pleural effusions, increased. Electronically Signed   By: Nolon Nations M.D.   On: 10/16/2019 12:09   VAS [place] LOWER EXTREMITY VENOUS (DVT) (ONLY [facility] & [facility])  Result Date: 10/16/2019  Lower Venous DVTStudy Indications: SOB, and History of Covid-19 infection 4/21, asthma.  Limitations: Movement. Comparison Study: No prior study Performing Technologist: Sharion Dove RVS  Examination Guidelines: A complete evaluation includes B-mode imaging, spectral Doppler, color Doppler, and power Doppler as needed of all accessible portions of each vessel. Bilateral testing is considered an integral part of a complete examination. Limited examinations for reoccurring indications may be performed as noted. The reflux portion of the exam is performed with the patient in reverse Trendelenburg.  +---------+---------------+---------+-----------+----------+--------------+ RIGHT    CompressibilityPhasicitySpontaneityPropertiesThrombus Aging +---------+---------------+---------+-----------+----------+--------------+ CFV      Full           Yes      Yes                                 +---------+---------------+---------+-----------+----------+--------------+ SFJ      Full                                                        +---------+---------------+---------+-----------+----------+--------------+ FV Prox  Full                                                        +---------+---------------+---------+-----------+----------+--------------+ FV Mid   Full                                                         +---------+---------------+---------+-----------+----------+--------------+ FV DistalFull                                                        +---------+---------------+---------+-----------+----------+--------------+ PFV      Full                                                        +---------+---------------+---------+-----------+----------+--------------+ POP      Full           Yes      Yes                                 +---------+---------------+---------+-----------+----------+--------------+  PTV      Full                                                        +---------+---------------+---------+-----------+----------+--------------+ PERO     Full                                                        +---------+---------------+---------+-----------+----------+--------------+   +---------+---------------+---------+-----------+----------+--------------+ LEFT     CompressibilityPhasicitySpontaneityPropertiesThrombus Aging +---------+---------------+---------+-----------+----------+--------------+ CFV      Full           Yes      Yes                                 +---------+---------------+---------+-----------+----------+--------------+ SFJ      Full                                                        +---------+---------------+---------+-----------+----------+--------------+ FV Prox  Full                                                        +---------+---------------+---------+-----------+----------+--------------+ FV Mid   Full                                                        +---------+---------------+---------+-----------+----------+--------------+ FV DistalFull                                                        +---------+---------------+---------+-----------+----------+--------------+ PFV      Full                                                         +---------+---------------+---------+-----------+----------+--------------+ POP      Full           Yes      Yes                                 +---------+---------------+---------+-----------+----------+--------------+ PTV      Full                                                        +---------+---------------+---------+-----------+----------+--------------+  PERO     Full                                                        +---------+---------------+---------+-----------+----------+--------------+     Summary: BILATERAL: - No evidence of deep vein thrombosis seen in the lower extremities, bilaterally. -   *See table(s) above for measurements and observations.    Preliminary     EKG: Independently reviewed. Sinus tachycardia, LAE, possible lateral injury - not acute.  Assessment/Plan Active Problems:   DOE (dyspnea on exertion)   Depression   Diabetes mellitus type 2 in obese (HCC)   Chronic daily headache   GERD with esophagitis  (please populate well all problems here in Problem List. (For example, if patient is on BP meds at home and you resume or decide to hold them, it is a problem that needs to be her. Same for CAD, COPD, HLD and so on)   1. DOE - patient s/p covid 19 PNA in April. NO prior cardiac hx with negative nuclear stress test in 2015. Now with cardiomegaly, new since April /21, elevated BNP, early pulmonary edema raising concern for new onset heart failure. Although D-dimer is negative and LE venous doppler negative there is a concern for possible PE.  Plan Tele admit  Lasix 40 mg PO daily  Metoprolol 25 mg bid  2D echo  VQ scan  May need cardiology consult once Echo is done  2. HTN - no prior h/o or tx for HTN. BP is now elevated. Plan Lasix daily  Metoprolol bid  3. GERD - continue home meds  4. DM - on no medications. Last A1C 6.5% April '21 Plan A1C  Low carb diet  5. Depression/anxiety - continue home meds    DVT  prophylaxis: lovenox Code Status: full code  Family Communication: patient asked that family/contacts not be called due to the hour  Disposition Plan: home when medically stable  Consults called: none  Admission status: inpatient/tele   Adella Hare MD [facility] Pager [phone]  If 7PM-7AM, please contact night-coverage [email or web address] Password [facility]  10/16/2019, 11:29 PM

## 2019-10-16 NOTE — ED Notes (Signed)
Patient ambulated to and from the bathroom with a steady gait,patient c/o sob, once placed back on the monitor sats were in the 80's on room air.  RN placed the patient on 2L via [place].

## 2019-10-16 NOTE — ED Notes (Signed)
95% on RA ambulating

## 2019-10-16 NOTE — ED Provider Notes (Signed)
[facility] DEPT Provider Note   CSN: [id number] Arrival date & time: 10/16/19  1101     History Chief Complaint  Patient presents with  . Cough    Nicole [name] is a 74 y.o. female.  Presented to ER with concern for cough, difficulty breathing.  Since diagnosis of Covid back in April she has had generalized fatigue, decreased energy, shortness of breath.  This seems to be worsening over the past 2 weeks, was diagnosed with pneumonia by primary doctor.  No associated chest pain.  Cough is nonproductive.  No blood.  No shortness of breath currently at rest.  No recent Covid exposures.  Also reports that she has been having bilateral lower leg cramps, this is also been going on for the past couple weeks, has not noted any leg swelling or deformity.  HPI     Past Medical History:  Diagnosis Date  . Anemia   . Anxiety   . Back pain   . Bloating   . Blurred vision    occasional  . Chest pain   . Colon polyps   . Dizziness   . Endometriosis    history of  . Fatigue   . Fibromyalgia   . Forgetfulness   . GERD with esophagitis    Follows with Dr. Fuller Plan with GI, EGD in the past  . Headache    migraines  . Joint pain   . Leg weakness   . Major depressive disorder   . Mild intermittent asthma, uncomplicated   . Mixed diabetic hyperlipidemia associated with type 2 diabetes mellitus ([place])   . Obesity   . SOB (shortness of breath)   . Type 2 diabetes mellitus without complication, without long-term current use of insulin ([place])   . Urinary incontinence   . Vaginitis     Patient Active Problem List   Diagnosis Date Noted  . DOE (dyspnea on exertion) 10/16/2019  . Mild intermittent asthma with (acute) exacerbation 06/24/2019  . COVID-19 virus infection 06/24/2019  . Acute metabolic encephalopathy 75/07/1831  . Hypokalemia, inadequate intake 06/24/2019  . Hypomagnesemia 06/24/2019  . GERD with esophagitis   . Major depressive disorder   .  Type 2 diabetes mellitus without complication, without long-term current use of insulin ([place])   . Mixed diabetic hyperlipidemia associated with type 2 diabetes mellitus ([place])   . Chronic daily headache 12/07/2014  . Memory loss 08/31/2014  . Worsening headaches 08/31/2014  . Atypical chest pain 11/05/2013  . Anemia   . Anxiety   . Depression   . Diabetes mellitus type 2 in obese ([place])   . Urinary incontinence   . Endometriosis   . Asthma   . Fibromyalgia     Past Surgical History:  Procedure Laterality Date  . ABDOMINAL HYSTERECTOMY     TAH BSO  . APPENDECTOMY  1999  . CATARACT EXTRACTION     both  . CHOLECYSTECTOMY    . DENTAL SURGERY    . HERNIA REPAIR  2000  . LAPAROSCOPIC ENDOMETRIOSIS FULGURATION       OB History    Gravida  1   Para  1   Term      Preterm      AB      Living  1     SAB      TAB      Ectopic      Multiple      Live Births  Family History  Problem Relation Age of Onset  . Hypertension Mother   . Heart failure Mother   . Diabetes Mother   . Hypertension Father   . Heart failure Father   . Diabetes Father   . Heart attack Father   . Stroke Father   . Cancer Brother        lung  . Thyroid disease Brother   . Cancer Brother        throat  . Cancer Paternal Aunt        bone  . Cancer Paternal Uncle        lung  . Hypertension Maternal Grandmother   . Hypertension Maternal Grandfather   . Hypertension Paternal Grandmother   . Hypertension Paternal Grandfather   . Diabetes Daughter   . Colon cancer Neg Hx   . Stomach cancer Neg Hx     Social History   Tobacco Use  . Smoking status: Never Smoker  . Smokeless tobacco: Never Used  Substance Use Topics  . Alcohol use: Yes    Alcohol/week: 0.0 standard drinks    Comment: occasional  . Drug use: No    Home Medications Prior to Admission medications   Medication Sig Start Date End Date Taking? Authorizing Provider  Ascorbic Acid (VITAMIN C PO) Take  1 tablet by mouth daily.   Yes [provider]  aspirin 81 MG tablet Take 81 mg by mouth daily.   Yes [provider]  DULoxetine (CYMBALTA) 60 MG capsule Take 60 mg by mouth daily. 08/10/19  Yes [provider]  ergocalciferol (VITAMIN D2) 50000 UNITS capsule Take 50,000 Units by mouth once a week.     Yes [provider]  fenofibrate 160 MG tablet Take 160 mg by mouth at bedtime.    Yes [provider]  fluticasone (FLONASE) 50 MCG/ACT nasal spray Place 1 spray into both nostrils daily as needed for allergies or rhinitis.  05/28/19  Yes [provider]  Fluticasone-Salmeterol (ADVAIR DISKUS) 250-50 MCG/DOSE AEPB Inhale 1 puff into the lungs 2 (two) times daily.   Yes [provider]  LORazepam (ATIVAN) 1 MG tablet Take 1 mg by mouth 2 (two) times daily.    Yes [provider]  oxyCODONE-acetaminophen (PERCOCET) 10-325 MG per tablet Take 1 tablet by mouth 2 (two) times daily as needed for pain.    Yes [provider]  pantoprazole (PROTONIX) 40 MG tablet Take 1 tablet (40 mg total) by mouth 2 (two) times daily. 08/14/11  Yes Ladene Artist, MD  PROAIR HFA 108 [phone] Base) MCG/ACT inhaler Inhale 2 puffs into the lungs every 4 (four) hours as needed for wheezing or shortness of breath. Uses as rescue inhaler Reported on 06/20/2015 Patient taking differently: Inhale 2 puffs into the lungs every 4 (four) hours as needed for wheezing or shortness of breath. Uses as rescue inhaler Reported on 10/16/2019 06/28/19  Yes Ghimire, Henreitta Leber, MD  benzonatate (TESSALON) 200 MG capsule Take 1 capsule (200 mg total) by mouth 3 (three) times daily as needed for cough. Patient not taking: Reported on 10/16/2019 06/28/19   Jonetta Osgood, MD  DULoxetine (CYMBALTA) 30 MG capsule Take 2 capsules daily Patient not taking: Reported on 10/16/2019 07/10/16   Cameron Sprang, MD  loratadine (CLARITIN) 10 MG tablet Take 1 tablet (10 mg total) by mouth  daily. Patient not taking: Reported on 10/16/2019 06/28/19   Jonetta Osgood, MD  NONFORMULARY OR COMPOUNDED ITEM Estradiol 0.02% vaginal  cream prefilled applicators apply one twice weekly Patient not taking: Reported on 10/16/2019 05/23/15   Fontaine, Belinda Block, MD  [facility] LANCETS 19Q MISC  06/03/16   [provider]  [facility] VERIO test strip  06/03/16   [provider]  predniSONE (DELTASONE) 10 MG tablet Take 40 mg daily for 1 day, 30 mg daily for 1 day, 20 mg daily for 1 days,10 mg daily for 1 day, then stop Patient not taking: Reported on 10/16/2019 06/28/19   Jonetta Osgood, MD  predniSONE (STERAPRED UNI-PAK 21 TAB) 5 MG (21) TBPK tablet Take by mouth as directed. Patient not taking: Reported on 10/16/2019 09/30/19   [provider]  pregabalin (LYRICA) 100 MG capsule Take 1 cap twice a day for 2 weeks, then decrease to 1 cap daily for 2 weeks, then stop Patient not taking: Reported on 10/16/2019 07/18/17   Cameron Sprang, MD    Allergies    Fish-derived products, Shellfish allergy, Aspirin, and Latex  Review of Systems   Review of Systems  Constitutional: Positive for appetite change and fatigue. Negative for chills and fever.  HENT: Negative for ear pain and sore throat.   Eyes: Negative for pain and visual disturbance.  Respiratory: Positive for cough and shortness of breath.   Cardiovascular: Negative for chest pain and palpitations.  Gastrointestinal: Negative for abdominal pain and vomiting.  Genitourinary: Negative for dysuria and hematuria.  Musculoskeletal: Negative for arthralgias and back pain.  Skin: Negative for color change and rash.  Neurological: Negative for seizures and syncope.  All other systems reviewed and are negative.   Physical Exam Updated Vital Signs BP (!) 150/110 (BP Location: Right Arm)   Pulse (!) 108   Temp 98.9 F (37.2 C) (Oral)   Resp 14   SpO2 95%   Physical Exam Vitals and nursing note reviewed.   Constitutional:      General: She is not in acute distress.    Appearance: She is well-developed.  HENT:     Head: Normocephalic and atraumatic.  Eyes:     Conjunctiva/sclera: Conjunctivae normal.  Cardiovascular:     Rate and Rhythm: Normal rate and regular rhythm.     Heart sounds: No murmur heard.   Pulmonary:     Effort: Pulmonary effort is normal. No respiratory distress.     Breath sounds: Normal breath sounds.  Abdominal:     Palpations: Abdomen is soft.     Tenderness: There is no abdominal tenderness.  Musculoskeletal:        General: No deformity or signs of injury.     Cervical back: Neck supple.     Right lower leg: No edema.     Left lower leg: No edema.  Skin:    General: Skin is warm and dry.     Capillary Refill: Capillary refill takes less than 2 seconds.  Neurological:     General: No focal deficit present.     Mental Status: She is alert and oriented to person, place, and time.  Psychiatric:        Mood and Affect: Mood normal.        Behavior: Behavior normal.     ED Results / Procedures / Treatments   Labs (all labs ordered are listed, but only abnormal results are displayed) Labs Reviewed  BASIC METABOLIC PANEL - Abnormal; Notable for the following components:      Result Value   Glucose, Bld 151 (*)    BUN 7 (*)  Calcium 8.8 (*)    All other components within normal limits  BRAIN NATRIURETIC PEPTIDE - Abnormal; Notable for the following components:   B Natriuretic Peptide 504.9 (*)    All other components within normal limits  D-DIMER, QUANTITATIVE (NOT AT [facility]) - Abnormal; Notable for the following components:   D-Dimer, Quant 0.61 (*)    All other components within normal limits  TROPONIN I (HIGH SENSITIVITY) - Abnormal; Notable for the following components:   Troponin I (High Sensitivity) 26 (*)    All other components within normal limits  SARS CORONAVIRUS 2 BY RT PCR (HOSPITAL ORDER, [facility])  CBC WITH  DIFFERENTIAL/PLATELET  PROTIME-INR  HEMOGLOBIN V2Z  BASIC METABOLIC PANEL    EKG EKG Interpretation  Date/Time:  Saturday October 16 2019 16:57:39 EDT Ventricular Rate:  111 PR Interval:    QRS Duration: 91 QT Interval:  359 QTC Calculation: 488 R Axis:   120 Text Interpretation: Right and left arm electrode reversal, interpretation assumes no reversal Sinus tachycardia Probable left atrial enlargement Probable lateral infarct, age indeterminate Baseline wander in lead(s) I III aVR aVL V5 Confirmed by Madalyn Rob [phone]) on 10/16/2019 5:31:13 PM   Radiology DG Chest 2 View  Result Date: 10/16/2019 CLINICAL DATA:  Cough. Cough for 2 weeks. Patient was diagnosed with Covid-19 in April. EXAM: CHEST - 2 VIEW COMPARISON:  In 06/24/2019 FINDINGS: The heart is enlarged but stable in configuration. There are bilateral pleural effusions. There is mild perihilar increased opacity and interstitial lines suggestive of early edema. Infectious process could have a similar appearance. There is increased bibasilar atelectasis. IMPRESSION: Stable cardiomegaly. Early edema versus inflammatory process. Small bilateral pleural effusions, increased. Electronically Signed   By: Nolon Nations M.D.   On: 10/16/2019 12:09   VAS [place] LOWER EXTREMITY VENOUS (DVT) (ONLY [facility] & [facility])  Result Date: 10/16/2019  Lower Venous DVTStudy Indications: SOB, and History of Covid-19 infection 4/21, asthma.  Limitations: Movement. Comparison Study: No prior study Performing Technologist: Sharion Dove RVS  Examination Guidelines: A complete evaluation includes B-mode imaging, spectral Doppler, color Doppler, and power Doppler as needed of all accessible portions of each vessel. Bilateral testing is considered an integral part of a complete examination. Limited examinations for reoccurring indications may be performed as noted. The reflux portion of the exam is performed with the patient in reverse Trendelenburg.   +---------+---------------+---------+-----------+----------+--------------+ RIGHT    CompressibilityPhasicitySpontaneityPropertiesThrombus Aging +---------+---------------+---------+-----------+----------+--------------+ CFV      Full           Yes      Yes                                 +---------+---------------+---------+-----------+----------+--------------+ SFJ      Full                                                        +---------+---------------+---------+-----------+----------+--------------+ FV Prox  Full                                                        +---------+---------------+---------+-----------+----------+--------------+ FV Mid  Full                                                        +---------+---------------+---------+-----------+----------+--------------+ FV DistalFull                                                        +---------+---------------+---------+-----------+----------+--------------+ PFV      Full                                                        +---------+---------------+---------+-----------+----------+--------------+ POP      Full           Yes      Yes                                 +---------+---------------+---------+-----------+----------+--------------+ PTV      Full                                                        +---------+---------------+---------+-----------+----------+--------------+ PERO     Full                                                        +---------+---------------+---------+-----------+----------+--------------+   +---------+---------------+---------+-----------+----------+--------------+ LEFT     CompressibilityPhasicitySpontaneityPropertiesThrombus Aging +---------+---------------+---------+-----------+----------+--------------+ CFV      Full           Yes      Yes                                  +---------+---------------+---------+-----------+----------+--------------+ SFJ      Full                                                        +---------+---------------+---------+-----------+----------+--------------+ FV Prox  Full                                                        +---------+---------------+---------+-----------+----------+--------------+ FV Mid   Full                                                        +---------+---------------+---------+-----------+----------+--------------+  FV DistalFull                                                        +---------+---------------+---------+-----------+----------+--------------+ PFV      Full                                                        +---------+---------------+---------+-----------+----------+--------------+ POP      Full           Yes      Yes                                 +---------+---------------+---------+-----------+----------+--------------+ PTV      Full                                                        +---------+---------------+---------+-----------+----------+--------------+ PERO     Full                                                        +---------+---------------+---------+-----------+----------+--------------+     Summary: BILATERAL: - No evidence of deep vein thrombosis seen in the lower extremities, bilaterally. -   *See table(s) above for measurements and observations.    Preliminary     Procedures .Critical Care Performed by: Lucrezia Starch, MD Authorized by: Lucrezia Starch, MD   Critical care provider statement:    Critical care time (minutes):  45   Critical care was necessary to treat or prevent imminent or life-threatening deterioration of the following conditions:  Respiratory failure   Critical care was time spent personally by me on the following activities:  Discussions with consultants, evaluation of patient's response to  treatment, examination of patient, ordering and performing treatments and interventions, ordering and review of laboratory studies, ordering and review of radiographic studies, pulse oximetry, re-evaluation of patient's condition, obtaining history from patient or surrogate and review of old charts   (including critical care time)  Medications Ordered in ED Medications  aspirin chewable tablet 81 mg (has no administration in time range)  fenofibrate tablet 160 mg (has no administration in time range)  DULoxetine (CYMBALTA) DR capsule 60 mg (has no administration in time range)  LORazepam (ATIVAN) tablet 1 mg (has no administration in time range)  pantoprazole (PROTONIX) EC tablet 40 mg (has no administration in time range)  fluticasone (FLONASE) 50 MCG/ACT nasal spray 1 spray (has no administration in time range)  mometasone-formoterol (DULERA) 200-5 MCG/ACT inhaler 2 puff (has no administration in time range)  albuterol (VENTOLIN HFA) 108 (90 Base) MCG/ACT inhaler 2 puff (has no administration in time range)  oxyCODONE-acetaminophen (PERCOCET/ROXICET) 5-325 MG per tablet 1 tablet (has no administration in time range)    And  oxyCODONE (Oxy IR/ROXICODONE) immediate release tablet 5  mg (has no administration in time range)  enoxaparin (LOVENOX) injection 40 mg (has no administration in time range)  sodium chloride flush (NS) 0.9 % injection 3 mL (has no administration in time range)  sodium chloride flush (NS) 0.9 % injection 3 mL (has no administration in time range)  0.9 %  sodium chloride infusion (has no administration in time range)  insulin aspart (novoLOG) injection 0-15 Units (has no administration in time range)  furosemide (LASIX) tablet 40 mg (has no administration in time range)  metoprolol tartrate (LOPRESSOR) tablet 25 mg (has no administration in time range)  furosemide (LASIX) injection 40 mg (40 mg Intravenous Given 10/16/19 2221)  potassium chloride SA (KLOR-CON) CR tablet 40  mEq (40 mEq Oral Given 10/16/19 2221)    ED Course  I have reviewed the triage vital signs and the nursing notes.  Pertinent labs & imaging results that were available during my care of the patient were reviewed by me and considered in my medical decision making (see chart for details).  Clinical Course as of Oct 16 8  Sat Oct 16, 2019  1728 Initial provider ordered CTA chest to rule out PE given complaint of shortness of breath and history of Covid, patient reports anaphylactic reaction to contrast, will check D-dimer and lower extremity duplex for now   [RD]    Clinical Course User Index [RD] Lucrezia Starch, MD   MDM Rules/Calculators/A&P                          74 year old lady presenting to ER with concern for shortness of breath.  On exam, patient well-appearing, no hypoxia at rest however on ambulation patient became dyspneic and hypoxic.  EKG without acute ischemic change, troponin mildly elevated, no specific chest pain.  Lower suspicion for ACS but this will need further monitoring.  CXR with small effusion, no obvious infiltrate.  D-dimer was mildly elevated but within normal limits age-adjusted.  Had initially ordered CTA chest however patient has anaphylactic reaction to contrast dye.  Ordered lower extremity duplex which were negative.  For additional work-up of her symptoms, believe patient would benefit from inpatient admission, further testing considerations such as echocardiogram and/or VQ scan.  Given elevation in proBNP, concern for possible new onset heart failure.  Will give trial dose of Lasix for now.  Reviewed case with Dr. Linda Hedges who agrees to admit patient.  Final Clinical Impression(s) / ED Diagnoses Final diagnoses:  Elevated troponin  Hypoxia  Dyspnea, unspecified type  Elevated brain natriuretic peptide (BNP) level    Rx / DC Orders ED Discharge Orders    None       Lucrezia Starch, MD 10/17/19 0010

## 2019-10-16 NOTE — Progress Notes (Signed)
VASCULAR LAB    Bilateral lower extremity venous duplex completed.    Preliminary report:  See CV proc for preliminary results.  Gave report to Dr. Greggory Brandy, [facility], RVT 10/16/2019, 7:20 PM

## 2019-10-16 NOTE — ED Triage Notes (Signed)
Patient here from home reporting cough for 2 weeks. States that she was diagnosed with pneumonia back in April with COVID. States that "it is still there". 96%RA.

## 2019-10-17 ENCOUNTER — Inpatient Hospital Stay (HOSPITAL_COMMUNITY): Payer: Medicare Other

## 2019-10-17 DIAGNOSIS — I340 Nonrheumatic mitral (valve) insufficiency: Secondary | ICD-10-CM

## 2019-10-17 DIAGNOSIS — I361 Nonrheumatic tricuspid (valve) insufficiency: Secondary | ICD-10-CM

## 2019-10-17 LAB — BASIC METABOLIC PANEL
Anion gap: 11 (ref 5–15)
BUN: 9 mg/dL (ref 8–23)
CO2: 29 mmol/L (ref 22–32)
Calcium: 8.7 mg/dL — ABNORMAL LOW (ref 8.9–10.3)
Chloride: 101 mmol/L (ref 98–111)
Creatinine, Ser: 0.78 mg/dL (ref 0.44–1.00)
GFR calc Af Amer: >60 mL/min (ref >60)
GFR calc non Af Amer: >60 mL/min (ref >60)
Glucose, Bld: 159 mg/dL — ABNORMAL HIGH (ref 70–99)
Potassium: 3.8 mmol/L (ref 3.5–5.1)
Sodium: 141 mmol/L (ref 135–145)

## 2019-10-17 LAB — ECHOCARDIOGRAM COMPLETE
Area-P 1/2: 4.21 cm2
Calc EF: 18.9 %
Height: 62 in
S' Lateral: 4.5 cm
Single Plane A2C EF: 22.4 %
Single Plane A4C EF: 15.3 %
Weight: 2540.8 oz

## 2019-10-17 LAB — HEMOGLOBIN A1C
Hgb A1c MFr Bld: 6.5 % — ABNORMAL HIGH (ref 4.8–5.6)
Mean Plasma Glucose: 139.85 mg/dL

## 2019-10-17 LAB — GLUCOSE, CAPILLARY
Glucose-Capillary: 138 mg/dL — ABNORMAL HIGH (ref 70–99)
Glucose-Capillary: 167 mg/dL — ABNORMAL HIGH (ref 70–99)
Glucose-Capillary: 133 mg/dL — ABNORMAL HIGH (ref 70–99)
Glucose-Capillary: 194 mg/dL — ABNORMAL HIGH (ref 70–99)

## 2019-10-17 MED ORDER — FUROSEMIDE 10 MG/ML IJ SOLN
40.0000 mg | Freq: Once | INTRAMUSCULAR | Status: AC
  Administered 2019-10-17: 40 mg via INTRAVENOUS
  Filled 2019-10-17: qty 4

## 2019-10-17 MED ORDER — PERFLUTREN LIPID MICROSPHERE
1.0000 mL | INTRAVENOUS | Status: AC | PRN
  Administered 2019-10-17: 2 mL via INTRAVENOUS
  Filled 2019-10-17: qty 10

## 2019-10-17 MED ORDER — FUROSEMIDE 40 MG PO TABS
40.0000 mg | ORAL_TABLET | Freq: Every day | ORAL | Status: DC
  Administered 2019-10-18 – 2019-10-22 (×4): 40 mg via ORAL
  Filled 2019-10-17 (×4): qty 1

## 2019-10-17 MED ORDER — ORAL CARE MOUTH RINSE
15.0000 mL | Freq: Two times a day (BID) | OROMUCOSAL | Status: DC
  Administered 2019-10-17 – 2019-10-22 (×8): 15 mL via OROMUCOSAL

## 2019-10-17 NOTE — Plan of Care (Signed)
  Problem: Education: Goal: Knowledge of General Education information will improve Description Including pain rating scale, medication(s)/side effects and non-pharmacologic comfort measures Outcome: Progressing   

## 2019-10-17 NOTE — Progress Notes (Signed)
  Echocardiogram 2D Echocardiogram has been performed.  Jennette Dubin 10/17/2019, 8:58 AM

## 2019-10-17 NOTE — Progress Notes (Signed)
PROGRESS NOTE    Patient: Nicole [name]                            PCP: [name], MD                    DOB: 1945-08-26            DOA: 10/16/2019 SNK:[id number]             DOS: 10/17/2019, 7:13 AM   LOS: 1 day   Date of Service: The patient was seen and examined on 10/17/2019  Subjective:   The patient was seen and examined this Am. Still complaining shortness of breath, but not wheezing. Stating I would like to know why I am shortness of breath, denies any lower extremity edema, abdominal edema. Shortness of breath gets worse with minimal exertion. Remains on 2-1/2 L of oxygen, currently satting 98% at rest  Confirms allergy to dye, also anxiety with tests, imaging    Brief Narrative:   Nicole [name] is a 74 y.o. female with medical history significant of fibromyalgia, GERD, DM. She was recently hospitalized with Covid 19 PNA. She presents to [facility] for evaluation of increasing SOB and dyspnea on exertion along with weakness. She has no prior cardiac hx., or PE  Assessment & Plan:   Active Problems:   Depression   Diabetes mellitus type 2 in obese (HCC)   Chronic daily headache   GERD with esophagitis   DOE (dyspnea on exertion)   Dyspnea on exertion  -98% on 2.5 L -BNP 504.9, on physical exam no lower extremity edema, no apnea congestion or truncal edema - patient s/p covid 19 PNA in April. NO prior cardiac hx with negative nuclear stress test in 2015. Now with cardiomegaly, new since April /21, elevated BNP, early pulmonary edema raising concern for new onset heart failure. Lower extremity Doppler negative   Although D-dimer is negative and LE venous doppler negative there is a concern for possible PE.  -We will continue with Tele admit             Lasix 40 mg another IV dose, will be switched to p.o. in a.m.             Metoprolol 25 mg bid             2D echo >>              VQ scan >>>              May need cardiology consult once Echo is done  HTN    -normotensive now, on arrival 166/106, blood pressure now 133/80  -no prior h/o or tx for HTN. BP is now elevated. -We will continue with Lasix IV today, switch to p.o. in a.m., metoprolol twice daily   GERD - continue home meds  DM II - POA no medications. Last A1C 6.5% April '21 Plan     A1C: 6.5             Low carb diet  Depression/anxiety  - continue home meds -Continue Cymbalta, as needed Ativan  DVT prophylaxis: lovenox Code Status: full code  Family Communication: patient asked that family/contacts not be called due to the hour  Disposition Plan: home when medically stable  Consults called: none  Admission status: inpatient/tele     ---------------------------------------------------------------------------------------------------------------------------------------------  DVT prophylaxis:  SCD/Compression stockings and Lovenox SQ Code Status:  Code Status: Full Code Family Communication: No family member present at bedside- attempt will be made to update daily The above findings and plan of care has been discussed with patient in detail,  they expressed understanding and agreement of above. -Advance care planning has been discussed.   Admission status:    Status is: Inpatient  Remains inpatient appropriate because:Inpatient level of care appropriate due to severity of illness   Dispo: The patient is from: Home              Anticipated d/c is to: Home              Anticipated d/c date is: 2 days              Patient currently is not medically stable to d/c.        Procedures:   No admission procedures for hospital encounter.     Antimicrobials:  Anti-infectives (From admission, onward)   None       Medication:  . aspirin  81 mg Oral Daily  . DULoxetine  60 mg Oral Daily  . enoxaparin (LOVENOX) injection  40 mg Subcutaneous Q24H  . fenofibrate  160 mg Oral QHS  . furosemide  40 mg Oral Daily  . insulin aspart  0-15 Units  Subcutaneous TID WC  . LORazepam  1 mg Oral BID  . mouth rinse  15 mL Mouth Rinse BID  . metoprolol tartrate  25 mg Oral BID  . mometasone-formoterol  2 puff Inhalation BID  . pantoprazole  40 mg Oral BID  . sodium chloride flush  3 mL Intravenous Q12H    sodium chloride, albuterol, fluticasone, oxyCODONE-acetaminophen **AND** oxyCODONE, sodium chloride flush   Objective:   Vitals:   10/17/19 0103 10/17/19 0145 10/17/19 0243 10/17/19 0600  BP:  (!) 162/101 (!) 153/97 133/80  Pulse:  96 99 83  Resp:  16  20  Temp:  98.4 F (36.9 C)  98.4 F (36.9 C)  TempSrc:  Oral  Oral  SpO2:  98%  98%  Weight: 72 kg     Height: 5\' 2"  (1.575 m)       Intake/Output Summary (Last 24 hours) at 10/17/2019 7616 Last data filed at 10/17/2019 0128 Gross per 24 hour  Intake 63 ml  Output 1350 ml  Net -1287 ml   Filed Weights   10/17/19 0103  Weight: 72 kg     Examination:   Physical Exam  Constitution:  Alert, cooperative, no distress, comfortable in 2-1/2 L of oxygen,, mild distress due to shortness of breath with minimal exertion Psychiatric: Anxious regarding new findings, shortness of breath, otherwise stable mood cognition intact  HEENT: Normocephalic, PERRL, otherwise with in Normal limits  Chest:Chest symmetric Cardio vascular:  S1/S2, RRR, No murmure, No Rubs or Gallops  pulmonary: Clear to auscultation bilaterally, respirations unlabored, negative wheezes / crackles Abdomen: Soft, non-tender, non-distended, bowel sounds,no masses, no organomegaly Muscular skeletal: Limited exam - in bed, able to move all 4 extremities, Normal strength,  Neuro: CNII-XII intact. , normal motor and sensation, reflexes intact  Extremities: No pitting edema lower extremities, +2 pulses  Skin: Dry, warm to touch, negative for any Rashes, No open wounds Wounds: per nursing documentation     ------------------------------------------------------------------------------------------------------------------------------------------    LABs:  CBC Latest Ref Rng & Units 10/16/2019 06/28/2019 06/27/2019  WBC 4.0 - 10.5 K/uL 9.7 7.0 6.1  Hemoglobin 12.0 - 15.0 g/dL 12.5 13.1 12.3  Hematocrit 36 - 46 % 38.1 38.1 36.1  Platelets 150 - 400 K/uL 227 281 224   CMP Latest Ref Rng & Units 10/17/2019 10/16/2019 06/27/2019  Glucose 70 - 99 mg/dL 159(H) 151(H) 173(H)  BUN 8 - 23 mg/dL 9 7(L) 11  Creatinine 0.44 - 1.00 mg/dL 0.78 0.69 0.69  Sodium 135 - 145 mmol/L 141 145 140  Potassium 3.5 - 5.1 mmol/L 3.8 3.6 4.2  Chloride 98 - 111 mmol/L 101 102 104  CO2 22 - 32 mmol/L 29 31 28   Calcium 8.9 - 10.3 mg/dL 8.7(L) 8.8(L) 9.9  Total Protein 6.5 - 8.1 g/dL - - 6.5  Total Bilirubin 0.3 - 1.2 mg/dL - - 0.5  Alkaline Phos 38 - 126 U/L - - 39  AST 15 - 41 U/L - - 14(L)  ALT 0 - 44 U/L - - 19       Micro Results Recent Results (from the past 240 hour(s))  SARS Coronavirus 2 by RT PCR (hospital order, performed in [facility] hospital lab) Nasopharyngeal Nasopharyngeal Swab     Status: None   Collection Time: 10/16/19  5:10 PM   Specimen: Nasopharyngeal Swab  Result Value Ref Range Status   SARS Coronavirus 2 NEGATIVE NEGATIVE Final    Comment: (NOTE) SARS-CoV-2 target nucleic acids are NOT DETECTED.  The SARS-CoV-2 RNA is generally detectable in upper and lower respiratory specimens during the acute phase of infection. The lowest concentration of SARS-CoV-2 viral copies this assay can detect is 250 copies / mL. A negative result does not preclude SARS-CoV-2 infection and should not be used as the sole basis for treatment or other patient management decisions.  A negative result may occur with improper specimen collection / handling, submission of specimen other than nasopharyngeal swab, presence of viral mutation(s) within the areas targeted by this assay, and inadequate number of viral  copies (<250 copies / mL). A negative result must be combined with clinical observations, patient history, and epidemiological information.  Fact Sheet for Patients:   StrictlyIdeas.no  Fact Sheet for Healthcare Providers: BankingDealers.co.za  This test is not yet approved or  cleared by the [place] FDA and has been authorized for detection and/or diagnosis of SARS-CoV-2 by FDA under an Emergency Use Authorization (EUA).  This EUA will remain in effect (meaning this test can be used) for the duration of the COVID-19 declaration under Section 564(b)(1) of the Act, 21 U.S.C. section 360bbb-3(b)(1), unless the authorization is terminated or revoked sooner.  Performed at [facility], [place] [address] [place], [place] [address]     Radiology Reports DG Chest 2 View  Result Date: 10/16/2019 CLINICAL DATA:  Cough. Cough for 2 weeks. Patient was diagnosed with Covid-19 in April. EXAM: CHEST - 2 VIEW COMPARISON:  In 06/24/2019 FINDINGS: The heart is enlarged but stable in configuration. There are bilateral pleural effusions. There is mild perihilar increased opacity and interstitial lines suggestive of early edema. Infectious process could have a similar appearance. There is increased bibasilar atelectasis. IMPRESSION: Stable cardiomegaly. Early edema versus inflammatory process. Small bilateral pleural effusions, increased. Electronically Signed   By: Nolon Nations M.D.   On: 10/16/2019 12:09   VAS [place] LOWER EXTREMITY VENOUS (DVT) (ONLY [facility] & [facility])  Result Date: 10/16/2019  Lower Venous DVTStudy Indications: SOB, and History of Covid-19 infection 4/21, asthma.  Limitations: Movement. Comparison Study: No prior study Performing Technologist: Sharion Dove RVS  Examination Guidelines: A complete evaluation includes B-mode imaging, spectral Doppler, color Doppler, and power Doppler as needed of all accessible portions of  each  vessel. Bilateral testing is considered an integral part of a complete examination. Limited examinations for reoccurring indications may be performed as noted. The reflux por    Summary: BILATERAL: - No evidence of deep vein thrombosis seen in the lower extremities, bilaterally. -   *See table(s) above for measurements and observations.    Preliminary     SIGNED: Deatra James, MD, FACP, FHM. [facility],  Pager (please use [email or web address] to page/text)  If 7PM-7AM, please contact night-coverage [email or web address] [email or web address] [facility] 10/17/2019, 7:13 AM

## 2019-10-18 DIAGNOSIS — R0600 Dyspnea, unspecified: Secondary | ICD-10-CM

## 2019-10-18 LAB — BASIC METABOLIC PANEL
Anion gap: 12 (ref 5–15)
BUN: 19 mg/dL (ref 8–23)
CO2: 30 mmol/L (ref 22–32)
Calcium: 7.8 mg/dL — ABNORMAL LOW (ref 8.9–10.3)
Chloride: 95 mmol/L — ABNORMAL LOW (ref 98–111)
Creatinine, Ser: 0.89 mg/dL (ref 0.44–1.00)
GFR calc Af Amer: >60 mL/min (ref >60)
GFR calc non Af Amer: >60 mL/min (ref >60)
Glucose, Bld: 158 mg/dL — ABNORMAL HIGH (ref 70–99)
Potassium: 3.6 mmol/L (ref 3.5–5.1)
Sodium: 137 mmol/L (ref 135–145)

## 2019-10-18 LAB — CBC
HCT: 36.4 % (ref 36.0–46.0)
Hemoglobin: 11.9 g/dL — ABNORMAL LOW (ref 12.0–15.0)
MCH: 30.7 pg (ref 26.0–34.0)
MCHC: 32.7 g/dL (ref 30.0–36.0)
MCV: 93.8 fL (ref 80.0–100.0)
Platelets: 230 10*3/uL (ref 150–400)
RBC: 3.88 MIL/uL (ref 3.87–5.11)
RDW: 12.9 % (ref 11.5–15.5)
WBC: 9.8 10*3/uL (ref 4.0–10.5)
nRBC: 0 % (ref 0.0–0.2)

## 2019-10-18 LAB — GLUCOSE, CAPILLARY
Glucose-Capillary: 130 mg/dL — ABNORMAL HIGH (ref 70–99)
Glucose-Capillary: 193 mg/dL — ABNORMAL HIGH (ref 70–99)
Glucose-Capillary: 128 mg/dL — ABNORMAL HIGH (ref 70–99)
Glucose-Capillary: 138 mg/dL — ABNORMAL HIGH (ref 70–99)

## 2019-10-18 MED ORDER — LISINOPRIL 10 MG PO TABS
10.0000 mg | ORAL_TABLET | Freq: Every day | ORAL | Status: DC
  Administered 2019-10-18: 10 mg via ORAL
  Filled 2019-10-18: qty 1

## 2019-10-18 MED ORDER — METOPROLOL SUCCINATE ER 25 MG PO TB24
25.0000 mg | ORAL_TABLET | Freq: Every day | ORAL | Status: DC
  Administered 2019-10-19 – 2019-10-22 (×3): 25 mg via ORAL
  Filled 2019-10-18 (×3): qty 1

## 2019-10-18 MED ORDER — PANTOPRAZOLE SODIUM 40 MG PO TBEC
40.0000 mg | DELAYED_RELEASE_TABLET | Freq: Every day | ORAL | Status: DC
  Administered 2019-10-19 – 2019-10-22 (×3): 40 mg via ORAL
  Filled 2019-10-18 (×3): qty 1

## 2019-10-18 MED ORDER — SACUBITRIL-VALSARTAN 24-26 MG PO TABS
1.0000 | ORAL_TABLET | Freq: Two times a day (BID) | ORAL | Status: DC
  Administered 2019-10-20 – 2019-10-22 (×4): 1 via ORAL
  Filled 2019-10-18 (×5): qty 1

## 2019-10-18 MED ORDER — DAPAGLIFLOZIN PROPANEDIOL 10 MG PO TABS
10.0000 mg | ORAL_TABLET | Freq: Every day | ORAL | Status: DC
  Administered 2019-10-19 – 2019-10-22 (×3): 10 mg via ORAL
  Filled 2019-10-18 (×4): qty 1

## 2019-10-18 NOTE — Progress Notes (Signed)
Patient sating 92-RA with ambulation to the bathroom but still C/O dyspnea, placed back on 1L of oxygen and she stated it helps her SOB. Will continue to assess patient.

## 2019-10-18 NOTE — Consult Note (Addendum)
Cardiology Consultation:   Patient ID: Nicole [name] MRN: [id number]; DOB: 18-Feb-1946  Admit date: 10/16/2019 Date of Consult: 10/18/2019  Primary Care Provider: [name], MD [facility] Cardiologist: Peter Martinique, MD  [facility] Electrophysiologist:  None    Patient Profile:   Nicole [name] is a 74 y.o. female with a hx of DM-2, obesity, strong FH of CAD, and normal nuc study 2015, GERD, asthma and fibromyalgia she did have COVID PNA in April 2021 who is being seen today for the evaluation of cardiomyopathy at the request of Dr. Roger Shelter.  History of Present Illness:   Nicole [name] with above hx, neg nuc studies in past and recent COVID 19 PNA was admitted 10/16/19 for SOB, DOE and cough.  Non productive cough. She complained of leg cramping as well.   Her BP was elevated in ER at 150/110 now controlled.      She never felt well after hospitalization for COVID.  She has DOE and a chronic chest pain.  Some she relates to GERD and others she does not know.  Begins under Lt breast and radiates across abd. Some discomfort in Rt arm and numbness in Rt fingers.  Over last 2 weeks increase of dyspnea.  SOB with any exertion.  Not so much chest pain.  She has had to prop up on 3 pillows up from 1-2 to go to sleep due to dyspnea.  Some lower ext edema.  EKG:  The EKG was personally reviewed and demonstrates:  ST at 111, + artifact no acute ST changes and when compared to April 2021 EKG stable to improved  EKG today with mild ST elevation in V3-6 with LVH as well.  Done in sitting position.  No chest pain. Telemetry:  Telemetry was personally reviewed and demonstrates:  SR to ST with PACs  BNP was 504, CXR with cardiomegaly new from 06/2019  D-dimer.  0.61 CXR  Hs troponin 26 K+ 3.6, Na 145, Cr 0.69  hgb 12.5 WBC 9.7 plts 227  COVID neg.   2V CXR with stable cardiomegaly, early edema versus inflammatory process. Small bilateral pleural effusions, increased.  VQ neg for PE  Echo  10/16/19 with EF 20-25%, global hypokinesis and internal cavity size mildly dilated.  G2DD RV normal and mild MR.   She received lasix 40 mg IV twice and no on 40 po daily.   I&O neg 1707 since admit no weights.  On lopressor 25 BID new for pt.   BP 121/75 P 74 R 15 afebrile Currently sitting up in chair, without chest pain, dyspnea with talking.     Past Medical History:  Diagnosis Date  . Anemia   . Anxiety   . Back pain   . Bloating   . Blurred vision    occasional  . Chest pain   . Colon polyps   . Dizziness   . Endometriosis    history of  . Fatigue   . Fibromyalgia   . Forgetfulness   . GERD with esophagitis    Follows with Dr. Fuller Plan with GI, EGD in the past  . Headache    migraines  . Joint pain   . Leg weakness   . Major depressive disorder   . Mild intermittent asthma, uncomplicated   . Mixed diabetic hyperlipidemia associated with type 2 diabetes mellitus ([place])   . Obesity   . SOB (shortness of breath)   . Type 2 diabetes mellitus without complication, without long-term current use of insulin (  [place])   . Urinary incontinence   . Vaginitis     Past Surgical History:  Procedure Laterality Date  . ABDOMINAL HYSTERECTOMY     TAH BSO  . APPENDECTOMY  1999  . CATARACT EXTRACTION     both  . CHOLECYSTECTOMY    . DENTAL SURGERY    . HERNIA REPAIR  2000  . LAPAROSCOPIC ENDOMETRIOSIS FULGURATION       Home Medications:  Prior to Admission medications   Medication Sig Start Date End Date Taking? Authorizing Provider  Ascorbic Acid (VITAMIN C PO) Take 1 tablet by mouth daily.   Yes [provider]  aspirin 81 MG tablet Take 81 mg by mouth daily.   Yes [provider]  DULoxetine (CYMBALTA) 60 MG capsule Take 60 mg by mouth daily. 08/10/19  Yes [provider]  ergocalciferol (VITAMIN D2) 50000 UNITS capsule Take 50,000 Units by mouth once a week.     Yes [provider]  fenofibrate 160 MG tablet Take 160 mg by mouth at  bedtime.    Yes [provider]  fluticasone (FLONASE) 50 MCG/ACT nasal spray Place 1 spray into both nostrils daily as needed for allergies or rhinitis.  05/28/19  Yes [provider]  Fluticasone-Salmeterol (ADVAIR DISKUS) 250-50 MCG/DOSE AEPB Inhale 1 puff into the lungs 2 (two) times daily.   Yes [provider]  LORazepam (ATIVAN) 1 MG tablet Take 1 mg by mouth 2 (two) times daily.    Yes [provider]  oxyCODONE-acetaminophen (PERCOCET) 10-325 MG per tablet Take 1 tablet by mouth 2 (two) times daily as needed for pain.    Yes [provider]  pantoprazole (PROTONIX) 40 MG tablet Take 1 tablet (40 mg total) by mouth 2 (two) times daily. 08/14/11  Yes Ladene Artist, MD  PROAIR HFA 108 [phone] Base) MCG/ACT inhaler Inhale 2 puffs into the lungs every 4 (four) hours as needed for wheezing or shortness of breath. Uses as rescue inhaler Reported on 06/20/2015 Patient taking differently: Inhale 2 puffs into the lungs every 4 (four) hours as needed for wheezing or shortness of breath. Uses as rescue inhaler Reported on 10/16/2019 06/28/19  Yes Ghimire, Henreitta Leber, MD  benzonatate (TESSALON) 200 MG capsule Take 1 capsule (200 mg total) by mouth 3 (three) times daily as needed for cough. Patient not taking: Reported on 10/16/2019 06/28/19   Jonetta Osgood, MD  DULoxetine (CYMBALTA) 30 MG capsule Take 2 capsules daily Patient not taking: Reported on 10/16/2019 07/10/16   Cameron Sprang, MD  loratadine (CLARITIN) 10 MG tablet Take 1 tablet (10 mg total) by mouth daily. Patient not taking: Reported on 10/16/2019 06/28/19   Jonetta Osgood, MD  NONFORMULARY OR COMPOUNDED ITEM Estradiol 0.02% vaginal cream prefilled applicators apply one twice weekly Patient not taking: Reported on 10/16/2019 05/23/15   Fontaine, Belinda Block, MD  [facility] LANCETS 25Z [place]  06/03/16   [provider]  [facility] VERIO test strip  06/03/16   [provider]  predniSONE  (DELTASONE) 10 MG tablet Take 40 mg daily for 1 day, 30 mg daily for 1 day, 20 mg daily for 1 days,10 mg daily for 1 day, then stop Patient not taking: Reported on 10/16/2019 06/28/19   Jonetta Osgood, MD  predniSONE (STERAPRED UNI-PAK 21 TAB) 5 MG (21) TBPK tablet Take by mouth as directed. Patient not taking: Reported on 10/16/2019 09/30/19   [provider]  pregabalin (LYRICA) 100 MG capsule Take 1  cap twice a day for 2 weeks, then decrease to 1 cap daily for 2 weeks, then stop Patient not taking: Reported on 10/16/2019 07/18/17   Cameron Sprang, MD    Inpatient Medications: Scheduled Meds: . aspirin  81 mg Oral Daily  . DULoxetine  60 mg Oral Daily  . enoxaparin (LOVENOX) injection  40 mg Subcutaneous Q24H  . fenofibrate  160 mg Oral QHS  . furosemide  40 mg Oral Daily  . insulin aspart  0-15 Units Subcutaneous TID WC  . LORazepam  1 mg Oral BID  . mouth rinse  15 mL Mouth Rinse BID  . metoprolol tartrate  25 mg Oral BID  . mometasone-formoterol  2 puff Inhalation BID  . pantoprazole  40 mg Oral BID  . sodium chloride flush  3 mL Intravenous Q12H   Continuous Infusions: . sodium chloride     PRN Meds: sodium chloride, albuterol, fluticasone, oxyCODONE-acetaminophen **AND** oxyCODONE, sodium chloride flush  Allergies:    Allergies  Allergen Reactions  . Fish-Derived Products Anaphylaxis  . Shellfish Allergy Shortness Of Breath    In dye for scan.  . Aspirin Nausea Only and Other (See Comments)    Pt stated it only gives her problems when she takes the high dose (325 mg)  . Latex Other (See Comments)    Pt says she gets a "stinky infection"    Social History:   Social History   Socioeconomic History  . Marital status: Widowed    Spouse name: Not on file  . Number of children: 1  . Years of education: Not on file  . Highest education level: Not on file  Occupational History  . Occupation: disability  Tobacco Use  . Smoking status: Never Smoker  . Smokeless  tobacco: Never Used  Substance and Sexual Activity  . Alcohol use: Yes    Alcohol/week: 0.0 standard drinks    Comment: occasional  . Drug use: No  . Sexual activity: Not Currently    Comment: 1st intercourse 74 yo-Fewer than 5 partners  Other Topics Concern  . Not on file  Social History Narrative  . Not on file   Social Determinants of Health   Financial Resource Strain:   . Difficulty of Paying Living Expenses:   Food Insecurity:   . Worried About Charity fundraiser in the Last Year:   . Arboriculturist in the Last Year:   Transportation Needs:   . Film/video editor (Medical):   Marland Kitchen Lack of Transportation (Non-Medical):   Physical Activity:   . Days of Exercise per Week:   . Minutes of Exercise per Session:   Stress:   . Feeling of Stress :   Social Connections:   . Frequency of Communication with Friends and Family:   . Frequency of Social Gatherings with Friends and Family:   . Attends Religious Services:   . Active Member of Clubs or Organizations:   . Attends Archivist Meetings:   Marland Kitchen Marital Status:   Intimate Partner Violence:   . Fear of Current or Ex-Partner:   . Emotionally Abused:   Marland Kitchen Physically Abused:   . Sexually Abused:     Family History:    Family History  Problem Relation Age of Onset  . Hypertension Mother   . Heart failure Mother   . Diabetes Mother   . Hypertension Father   . Heart failure Father   . Diabetes Father   . Heart attack Father   .  Stroke Father   . Cancer Brother        lung  . Thyroid disease Brother   . Cancer Brother        throat  . Cancer Paternal Aunt        bone  . Cancer Paternal Uncle        lung  . Hypertension Maternal Grandmother   . Hypertension Maternal Grandfather   . Hypertension Paternal Grandmother   . Hypertension Paternal Grandfather   . Diabetes Daughter   . Colon cancer Neg Hx   . Stomach cancer Neg Hx      ROS:  Please see the history of present illness.  General:no colds  or fevers, no weight changes Skin:no rashes or ulcers HEENT:no blurred vision, no congestion CV:see HPI PUL:see HPI GI:no diarrhea constipation or melena, no indigestion GU:no hematuria, no dysuria MS:no joint pain, no claudication Neuro:no syncope, no lightheadedness Endo:+ diabetes, no thyroid disease  All other ROS reviewed and negative.     Physical Exam/Data:   Vitals:   10/17/19 1317 10/17/19 2106 10/17/19 2153 10/18/19 0609  BP: 112/78  120/66 121/75  Pulse: 79  86 75  Resp: 17  18 19   Temp: 98.9 F (37.2 C)  98.7 F (37.1 C) 98.3 F (36.8 C)  TempSrc: Oral  Oral Oral  SpO2: 97% 97% 97% 99%  Weight:      Height:        Intake/Output Summary (Last 24 hours) at 10/18/2019 1001 Last data filed at 10/17/2019 2227 Gross per 24 hour  Intake 360 ml  Output 900 ml  Net -540 ml   Last 3 Weights 10/17/2019 06/25/2019 06/24/2019  Weight (lbs) 158 lb 12.8 oz 153 lb 150 lb  Weight (kg) 72.031 kg 69.4 kg 68.04 kg     Body mass index is 29.04 kg/m.  General:  Well nourished, well developed, in no acute distress- some dyspnea with talking. HEENT: normal Lymph: no adenopathy Neck: no JVD sitting up in chair Endocrine:  No thryomegaly Vascular: No carotid bruits; pedal pulses 2+ bilaterally   Cardiac:  normal S1, S2; RRR; no murmur gallup rub or click Lungs:  Diminished breath sounds to auscultation bilaterally, no wheezing, rhonchi or rales  Abd: soft, nontender, no hepatomegaly  Ext: no edema Musculoskeletal:  No deformities, BUE and BLE strength normal and equal Skin: warm and dry  Neuro:  Alert and oriented X 3 MAE follows commands, no focal abnormalities noted Psych:  Normal affect     Relevant CV Studies: Echo 10/16/19  IMPRESSIONS    1. Left ventricular ejection fraction, by estimation, is 20 to 25%. The  left ventricle has severely decreased function. The left ventricle  demonstrates global hypokinesis. The left ventricular internal cavity size  was mildly  dilated. Left ventricular  diastolic parameters are consistent with Grade II diastolic dysfunction  (pseudonormalization). Elevated left ventricular end-diastolic pressure.  2. Right ventricular systolic function is normal. The right ventricular  size is normal. There is normal pulmonary artery systolic pressure. The  estimated right ventricular systolic pressure is 25.0 mmHg.  3. Left atrial size was severely dilated.  4. The mitral valve is normal in structure. Mild mitral valve  regurgitation. No evidence of mitral stenosis.  5. The aortic valve is tricuspid. Aortic valve regurgitation is trivial.  Mild to moderate aortic valve sclerosis/calcification is present, without  any evidence of aortic stenosis.  6. The inferior vena cava is normal in size with greater than 50%  respiratory variability,  suggesting right atrial pressure of 3 mmHg.  7. There is right bowing of the interatrial septum, suggestive of  elevated left atrial pressure. No atrial level shunt detected by color  flow Doppler.   FINDINGS  Left Ventricle: Left ventricular ejection fraction, by estimation, is 20  to 25%. The left ventricle has severely decreased function. The left  ventricle demonstrates global hypokinesis. Definity contrast agent was  given IV to delineate the left  ventricular endocardial borders. The left ventricular internal cavity size  was mildly dilated. There is no left ventricular hypertrophy. Left  ventricular diastolic parameters are consistent with Grade II diastolic  dysfunction (pseudonormalization).  Elevated left ventricular end-diastolic pressure.   Right Ventricle: The right ventricular size is normal. No increase in  right ventricular wall thickness. Right ventricular systolic function is  normal. There is normal pulmonary artery systolic pressure. The tricuspid  regurgitant velocity is 2.32 m/s, and  with an assumed right atrial pressure of 3 mmHg, the estimated right    ventricular systolic pressure is 60.1 mmHg.   Left Atrium: Left atrial size was severely dilated.   Right Atrium: Right atrial size was normal in size.   Pericardium: A small pericardial effusion is present. The pericardial  effusion is anterior to the right ventricle.   Mitral Valve: The mitral valve is normal in structure. Normal mobility of  the mitral valve leaflets. Mild mitral valve regurgitation. No evidence of  mitral valve stenosis.   Tricuspid Valve: The tricuspid valve is normal in structure. Tricuspid  valve regurgitation is mild . No evidence of tricuspid stenosis.   Aortic Valve: The aortic valve is tricuspid. . There is mild thickening  and mild calcification of the aortic valve. Aortic valve regurgitation is  trivial. Mild to moderate aortic valve sclerosis/calcification is present,  without any evidence of aortic  stenosis. There is mild thickening of the aortic valve. There is mild  calcification of the aortic valve.   Pulmonic Valve: The pulmonic valve was normal in structure. Pulmonic valve  regurgitation is mild. No evidence of pulmonic stenosis.   Aorta: The aortic root is normal in size and structure.   Venous: The inferior vena cava is normal in size with greater than 50%  respiratory variability, suggesting right atrial pressure of 3 mmHg.   IAS/Shunts: There is right bowing of the interatrial septum, suggestive of  elevated left atrial pressure. No atrial level shunt detected by color  flow Doppler.   Laboratory Data:  High Sensitivity Troponin:   Recent Labs  Lab 10/16/19 1710  TROPONINIHS 26*     Chemistry Recent Labs  Lab 10/16/19 1615 10/17/19 0453 10/18/19 0450  NA 145 141 137  K 3.6 3.8 3.6  CL 102 101 95*  CO2 31 29 30   GLUCOSE 151* 159* 158*  BUN 7* 9 19  CREATININE 0.69 0.78 0.89  CALCIUM 8.8* 8.7* 7.8*  GFRNONAA >60 >60 >60  GFRAA >60 >60 >60  ANIONGAP 12 11 12     No results for input(s): PROT, ALBUMIN, AST, ALT,  ALKPHOS, BILITOT in the last 168 hours. Hematology Recent Labs  Lab 10/16/19 1615 10/18/19 0450  WBC 9.7 9.8  RBC 4.10 3.88  HGB 12.5 11.9*  HCT 38.1 36.4  MCV 92.9 93.8  MCH 30.5 30.7  MCHC 32.8 32.7  RDW 13.1 12.9  PLT 227 230   BNP Recent Labs  Lab 10/16/19 1615  BNP 504.9*    DDimer  Recent Labs  Lab 10/16/19 1710  DDIMER 0.61*  Radiology/Studies:  DG Chest 2 View  Result Date: 10/16/2019 CLINICAL DATA:  Cough. Cough for 2 weeks. Patient was diagnosed with Covid-19 in April. EXAM: CHEST - 2 VIEW COMPARISON:  In 06/24/2019 FINDINGS: The heart is enlarged but stable in configuration. There are bilateral pleural effusions. There is mild perihilar increased opacity and interstitial lines suggestive of early edema. Infectious process could have a similar appearance. There is increased bibasilar atelectasis. IMPRESSION: Stable cardiomegaly. Early edema versus inflammatory process. Small bilateral pleural effusions, increased. Electronically Signed   By: Nolon Nations M.D.   On: 10/16/2019 12:09   NM Pulmonary Perfusion  Result Date: 10/17/2019 CLINICAL DATA:  Chest pain EXAM: NUCLEAR MEDICINE PERFUSION LUNG SCAN TECHNIQUE: Perfusion images were obtained in multiple projections after intravenous injection of radiopharmaceutical. Ventilation scans intentionally deferred if perfusion scan and chest x-ray adequate for interpretation during COVID 19 epidemic. RADIOPHARMACEUTICALS:  4.4 mCi Tc-46m MAA IV COMPARISON:  Chest x-ray 10/16/2019 FINDINGS: No perfusion defects seen to suggest pulmonary embolus. IMPRESSION: No evidence of pulmonary embolus. Electronically Signed   By: Rolm Baptise M.D.   On: 10/17/2019 18:05   ECHOCARDIOGRAM COMPLETE  Result Date: 10/17/2019    ECHOCARDIOGRAM REPORT   Patient Name:   Nicole [name] Date of Exam: 10/17/2019 Medical Rec #:  [id number]       Height:       62.0 in Accession #:    [id number]      Weight:       158.8 lb Date of Birth:  06/05/1945         BSA:          1.733 m Patient Age:    68 years        BP:           133/80 mmHg Patient Gender: F               HR:           83 bpm. Exam Location:  Inpatient Procedure: 2D Echo and Intracardiac Opacification Agent Indications:    Cardiomegaly I51.7  History:        Patient has no prior history of Echocardiogram examinations.                 Signs/Symptoms:Dyspnea; Risk Factors:Diabetes.  Sonographer:    Mikki Santee RDCS (AE) Referring Phys: [place]  1. Left ventricular ejection fraction, by estimation, is 20 to 25%. The left ventricle has severely decreased function. The left ventricle demonstrates global hypokinesis. The left ventricular internal cavity size was mildly dilated. Left ventricular diastolic parameters are consistent with Grade II diastolic dysfunction (pseudonormalization). Elevated left ventricular end-diastolic pressure.  2. Right ventricular systolic function is normal. The right ventricular size is normal. There is normal pulmonary artery systolic pressure. The estimated right ventricular systolic pressure is 05.3 mmHg.  3. Left atrial size was severely dilated.  4. The mitral valve is normal in structure. Mild mitral valve regurgitation. No evidence of mitral stenosis.  5. The aortic valve is tricuspid. Aortic valve regurgitation is trivial. Mild to moderate aortic valve sclerosis/calcification is present, without any evidence of aortic stenosis.  6. The inferior vena cava is normal in size with greater than 50% respiratory variability, suggesting right atrial pressure of 3 mmHg.  7. There is right bowing of the interatrial septum, suggestive of elevated left atrial pressure. No atrial level shunt detected by color flow Doppler. FINDINGS  Left Ventricle: Left ventricular ejection fraction, by estimation, is  20 to 25%. The left ventricle has severely decreased function. The left ventricle demonstrates global hypokinesis. Definity contrast agent was given IV  to delineate the left ventricular endocardial borders. The left ventricular internal cavity size was mildly dilated. There is no left ventricular hypertrophy. Left ventricular diastolic parameters are consistent with Grade II diastolic dysfunction (pseudonormalization). Elevated left ventricular end-diastolic pressure. Right Ventricle: The right ventricular size is normal. No increase in right ventricular wall thickness. Right ventricular systolic function is normal. There is normal pulmonary artery systolic pressure. The tricuspid regurgitant velocity is 2.32 m/s, and  with an assumed right atrial pressure of 3 mmHg, the estimated right ventricular systolic pressure is 94.5 mmHg. Left Atrium: Left atrial size was severely dilated. Right Atrium: Right atrial size was normal in size. Pericardium: A small pericardial effusion is present. The pericardial effusion is anterior to the right ventricle. Mitral Valve: The mitral valve is normal in structure. Normal mobility of the mitral valve leaflets. Mild mitral valve regurgitation. No evidence of mitral valve stenosis. Tricuspid Valve: The tricuspid valve is normal in structure. Tricuspid valve regurgitation is mild . No evidence of tricuspid stenosis. Aortic Valve: The aortic valve is tricuspid. . There is mild thickening and mild calcification of the aortic valve. Aortic valve regurgitation is trivial. Mild to moderate aortic valve sclerosis/calcification is present, without any evidence of aortic stenosis. There is mild thickening of the aortic valve. There is mild calcification of the aortic valve. Pulmonic Valve: The pulmonic valve was normal in structure. Pulmonic valve regurgitation is mild. No evidence of pulmonic stenosis. Aorta: The aortic root is normal in size and structure. Venous: The inferior vena cava is normal in size with greater than 50% respiratory variability, suggesting right atrial pressure of 3 mmHg. IAS/Shunts: There is right bowing of the  interatrial septum, suggestive of elevated left atrial pressure. No atrial level shunt detected by color flow Doppler.  LEFT VENTRICLE PLAX 2D LVIDd:         5.40 cm      Diastology LVIDs:         4.50 cm      LV e' lateral:   5.43 cm/s LV PW:         1.20 cm      LV E/e' lateral: 14.7 LV IVS:        0.90 cm      LV e' medial:    5.27 cm/s LVOT diam:     1.90 cm      LV E/e' medial:  15.2 LV SV:         31 LV SV Index:   18 LVOT Area:     2.84 cm  LV Volumes (MOD) LV vol d, MOD A2C: 183.0 ml LV vol d, MOD A4C: 163.0 ml LV vol s, MOD A2C: 142.0 ml LV vol s, MOD A4C: 138.0 ml LV SV MOD A2C:     41.0 ml LV SV MOD A4C:     163.0 ml LV SV MOD BP:      33.5 ml RIGHT VENTRICLE RV S prime:     8.18 cm/s TAPSE (M-mode): 1.9 cm LEFT ATRIUM              Index       RIGHT ATRIUM           Index LA diam:        4.60 cm  2.65 cm/m  RA Area:     15.10 cm LA Vol (A2C):  105.0 ml 60.58 ml/m RA Volume:   40.20 ml  23.19 ml/m LA Vol (A4C):   80.4 ml  46.39 ml/m LA Biplane Vol: 98.8 ml  57.01 ml/m  AORTIC VALVE LVOT Vmax:   59.40 cm/s LVOT Vmean:  41.800 cm/s LVOT VTI:    0.110 m  AORTA Ao Root diam: 3.10 cm MITRAL VALVE               TRICUSPID VALVE MV Area (PHT): 4.21 cm    TR Peak grad:   21.5 mmHg MV Decel Time: 180 msec    TR Vmax:        232.00 cm/s MV E velocity: 79.90 cm/s MV A velocity: 61.30 cm/s  SHUNTS MV E/A ratio:  1.30        Systemic VTI:  0.11 m                            Systemic Diam: 1.90 cm Fransico Him MD Electronically signed by Fransico Him MD Signature Date/Time: 10/17/2019/10:01:22 AM    Final    VAS [place] LOWER EXTREMITY VENOUS (DVT) (ONLY [facility] & [facility])  Result Date: 10/17/2019  Lower Venous DVTStudy Indications: SOB, and History of Covid-19 infection 4/21, asthma.  Limitations: Movement. Comparison Study: No prior study Performing Technologist: Sharion Dove RVS  Examination Guidelines: A complete evaluation includes B-mode imaging, spectral Doppler, color Doppler, and power Doppler as needed of all  accessible portions of each vessel. Bilateral testing is considered an integral part of a complete examination. Limited examinations for reoccurring indications may be performed as noted. The reflux portion of the exam is performed with the patient in reverse Trendelenburg.  +---------+---------------+---------+-----------+----------+--------------+ RIGHT    CompressibilityPhasicitySpontaneityPropertiesThrombus Aging +---------+---------------+---------+-----------+----------+--------------+ CFV      Full           Yes      Yes                                 +---------+---------------+---------+-----------+----------+--------------+ SFJ      Full                                                        +---------+---------------+---------+-----------+----------+--------------+ FV Prox  Full                                                        +---------+---------------+---------+-----------+----------+--------------+ FV Mid   Full                                                        +---------+---------------+---------+-----------+----------+--------------+ FV DistalFull                                                        +---------+---------------+---------+-----------+----------+--------------+  PFV      Full                                                        +---------+---------------+---------+-----------+----------+--------------+ POP      Full           Yes      Yes                                 +---------+---------------+---------+-----------+----------+--------------+ PTV      Full                                                        +---------+---------------+---------+-----------+----------+--------------+ PERO     Full                                                        +---------+---------------+---------+-----------+----------+--------------+   +---------+---------------+---------+-----------+----------+--------------+  LEFT     CompressibilityPhasicitySpontaneityPropertiesThrombus Aging +---------+---------------+---------+-----------+----------+--------------+ CFV      Full           Yes      Yes                                 +---------+---------------+---------+-----------+----------+--------------+ SFJ      Full                                                        +---------+---------------+---------+-----------+----------+--------------+ FV Prox  Full                                                        +---------+---------------+---------+-----------+----------+--------------+ FV Mid   Full                                                        +---------+---------------+---------+-----------+----------+--------------+ FV DistalFull                                                        +---------+---------------+---------+-----------+----------+--------------+ PFV      Full                                                        +---------+---------------+---------+-----------+----------+--------------+  POP      Full           Yes      Yes                                 +---------+---------------+---------+-----------+----------+--------------+ PTV      Full                                                        +---------+---------------+---------+-----------+----------+--------------+ PERO     Full                                                        +---------+---------------+---------+-----------+----------+--------------+     Summary: BILATERAL: - No evidence of deep vein thrombosis seen in the lower extremities, bilaterally. -   *See table(s) above for measurements and observations. Electronically signed by Harold Barban MD on 10/17/2019 at 4:56:24 PM.    Final        HEAR Score (for undifferentiated chest pain):     5  New York Heart Association (NYHA) Functional Class NYHA Class III  Assessment and Plan:   1. 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 Is neg  7169 since admit and now on 40 mg po lasix -though may need another day of IV pt did have COVID PNA in 06/2019  (d dimer 0.61 but neg VQ for PE) 2. Cardiomyopathy with global hypokinesis and LV dilatation -now on BB after diuresing.  Would add ARB with plan for entresto. Also aldactone. And thought to [place]   Has had neg nuc in 2015 but strong FH for CAD.  3. HTN elevated on admit now improved. With diuresis and BB.  4. DM-2 per IM 5. Hx of COVID 19 PNA 06/2019 -neg this admit.          For questions or updates, please contact [place] Please consult [email or web address] for contact info under    Signed, Cecilie Kicks, NP  10/18/2019 10:01 AM  Patient seen and examined with Cecilie Kicks, NP.  Agree as above, with the following exceptions and changes as noted below.  Patient presents with acute systolic heart failure with comorbid asthma and current wheezing.  She had Covid 19 pneumonia in the recent past and tells me she still has a sore throat from this and constant coughing.  She has had increasing dyspnea over the past 2 weeks and shortness of breath with exertion.  She has had orthopnea and lower extremity edema.  Echocardiogram obtained this hospitalization shows EF 20 to 25% with mild mitral valve regurgitation.  She has been diuresed on plan for transition to p.o. Lasix.  Gen: NAD, CV: RRR, no murmurs, Lungs: Diffuse end expiratory wheeze, Abd: soft, Extrem: Warm, well perfused, trace edema, Neuro/Psych: alert and oriented x 3, normal mood and affect. All available labs, radiology testing, previous records reviewed.  She has newly reduced ejection fraction in the setting of viral illness in April.  We discussed in detail the natural history of infection with viruses such as COVID-19 and the risk of cardiomyopathy as a result.  Consider myocarditis as a source of her reduced ejection fraction.  However we would also need to exclude ischemia and define  coronary anatomy.  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  She describes a story of being in a scanner 15 years ago and her mouth became very dry, she feels that she stopped breathing.  She says the radiologist then inserted a large needle into her chest. History is entirely unclear.  So as to avoid an anaphylactic reaction to iodinated contrast, we will not plan to perform coronary angiography or coronary CTA at this time.  She is actively wheezing, which makes administration of Lexiscan Myoview very challenging as well as it is contraindicated.  Dobutamine stress echocardiogram would also not be of great benefit at this time given severely reduced ejection fraction.  I am inclined to treat her cardiomyopathy with medical therapy and evaluate in several months for improvement.  If at that time there is no improvement or persistent symptoms of chest pain or shortness of breath, ischemic evaluation is warranted and risks and benefits can be reviewed again.  Perhaps at that time if she is not wheezing a Lexiscan Myoview can be used for risk stratification.  She was just started on lisinopril 10 mg daily.  Would recommend holding this medication so that we can potentially start Entresto.  This will require 36-hour washout. -start entresto 24-26 mg BID on 8/11 - HOLD LISINOPRIL - will add [place] tomorrow for HF management, patient is also diabetic. - transition metoprolol tartrate to succinate 25 mg daily for HF therapy.   Elouise Munroe, MD 10/18/19 3:04 PM

## 2019-10-18 NOTE — Progress Notes (Signed)
PROGRESS NOTE    Patient: Nicole [name]                            PCP: [name], MD                    DOB: 1945-12-13            DOA: 10/16/2019 [id number]:[id number]             DOS: 10/18/2019, 10:58 AM   LOS: 2 days   Date of Service: The patient was seen and examined on 10/18/2019  Subjective:   The patient was seen and examined this morning, has been weaned off to 1 L of oxygen via nasal cannula, satting 99%.  Per nursing staff with minimal exertion gets excessively shortness of breath. Continue did not have any chest pain  Finding of 2D cardiogram was discussed with a newly finding of progressively reduced LV function, new diagnosis CHF... Agree to cardiology consult  Patient was informed the VQ scan was negative for pulmonary embolism   Brief Narrative:   Nicole [name] is a 74 y.o. female with medical history significant of fibromyalgia, GERD, DM. She was recently hospitalized with Covid 19 PNA. She presents to [facility] for evaluation of increasing SOB and dyspnea on exertion along with weakness. She has no prior cardiac hx., or PE  Assessment & Plan:   Principal Problem:   DOE (dyspnea on exertion) Active Problems:   Diabetes mellitus type 2 in obese (HCC)   GERD with esophagitis   Depression   Chronic daily headache    New diagnosis of combined systolic and diastolic congestive heart failure -BNP 504.9, on physical exam no lower extremity edema, no apnea congestion or truncal edema - patient s/p covid 19 PNA in April. NO prior cardiac hx with negative nuclear stress test in 2015.  -Now with cardiomegaly, new since April /21, elevated BNP, early pulmonary edema raising Lower extremity Doppler negative              Lasix 40 mg another IV dose, will be switched to p.o. in a.m, on beta-blocker, added lisinopril             Metoprolol 25 mg bid, lisinopril milligrams daily initiated 8 /9             2D echo >> severe global hypokinesis, ejection fraction 20-25%              VQ scan >>> negative for PE            Cardiology consulted -appreciate further evaluation recommendation possible cath  Acute respiratory distress /shortness of breath/dyspnea on exertion -Likely due to above newly diagnosed CHF -- on admission 98% on 2.5 L >>> down to 1 L of oxygen, satting 99% -Continue have dyspnea with minimal exertion - D-dimer is negative and LE venous doppler negative  -VQ scan negative for PE --no other signs of hypoxia due to PE (negative tachypnea, tachycardia -History of asthma, no signs of exacerbation, negative for any wheezing -History of recent Covid, no signs pulmonary lung damage  HTN  -normotensive now, on arrival 166/106, blood pressure now 133/80  -no prior h/o or tx for HTN. BP is now elevated. -We will continue with Lasix IV today, switch to p.o. in a.m., metoprolol twice daily-lisinopril 10 mg p.o. added   GERD - continue home meds  DM II - POA no medications. Last A1C  6.5% April '21 Plan     A1C: 6.5             Low carb diet  Depression/anxiety  - continue home meds -Continue Cymbalta, as needed Ativan  DVT prophylaxis: lovenox Code Status: full code  Family Communication: patient asked that family/contacts not be called due to the hour  Disposition Plan: home when medically stable  Consults called: none  Admission status: inpatient/tele     ---------------------------------------------------------------------------------------------------------------------------------------------  DVT prophylaxis:  SCD/Compression stockings and Lovenox SQ Code Status:   Code Status: Full Code Family Communication: No family member present at bedside- attempt will be made to update daily The above findings and plan of care has been discussed with patient in detail,  they expressed understanding and agreement of above. -Advance care planning has been discussed.   Admission status:    Status is: Inpatient  Remains inpatient appropriate  because:Inpatient level of care appropriate due to severity of illness   Dispo: The patient is from: Home              Anticipated d/c is to: Home              Anticipated d/c date is: 2 days              Patient currently is not medically stable to d/c.        Procedures:   No admission procedures for hospital encounter.     Antimicrobials:  Anti-infectives (From admission, onward)   None       Medication:  . aspirin  81 mg Oral Daily  . DULoxetine  60 mg Oral Daily  . enoxaparin (LOVENOX) injection  40 mg Subcutaneous Q24H  . fenofibrate  160 mg Oral QHS  . furosemide  40 mg Oral Daily  . insulin aspart  0-15 Units Subcutaneous TID WC  . lisinopril  10 mg Oral Daily  . LORazepam  1 mg Oral BID  . mouth rinse  15 mL Mouth Rinse BID  . metoprolol tartrate  25 mg Oral BID  . mometasone-formoterol  2 puff Inhalation BID  . [START ON 10/19/2019] pantoprazole  40 mg Oral Daily  . sodium chloride flush  3 mL Intravenous Q12H    sodium chloride, albuterol, fluticasone, oxyCODONE-acetaminophen **AND** oxyCODONE, sodium chloride flush   Objective:   Vitals:   10/17/19 2106 10/17/19 2153 10/18/19 0609 10/18/19 1005  BP:  120/66 121/75 121/64  Pulse:  86 75 84  Resp:  18 19   Temp:  98.7 F (37.1 C) 98.3 F (36.8 C)   TempSrc:  Oral Oral   SpO2: 97% 97% 99%   Weight:    72.2 kg  Height:        Intake/Output Summary (Last 24 hours) at 10/18/2019 1058 Last data filed at 10/17/2019 2227 Gross per 24 hour  Intake 360 ml  Output 900 ml  Net -540 ml   Filed Weights   10/17/19 0103 10/18/19 1005  Weight: 72 kg 72.2 kg     Examination:      Physical Exam:   General:  Alert, oriented, cooperative, mild respiratory distress at rest, exacerbates with minimal exertion  HEENT:  Normocephalic, PERRL, otherwise with in Normal limits   Neuro:  CNII-XII intact. , normal motor and sensation, reflexes intact   Lungs:   Clear to auscultation BL, Respirations  unlabored, no wheezes / crackles  Cardio:    S1/S2, RRR, No murmure, No Rubs or Gallops   Abdomen:  Soft, non-tender, bowel sounds active all four quadrants,  no guarding or peritoneal signs.  Muscular skeletal:  Limited exam - in bed, able to move all 4 extremities, Normal strength,  2+ pulses,  symmetric, No pitting edema  Skin:  Dry, warm to touch, negative for any Rashes, No open wounds  Wounds: Please see nursing documentation         ------------------------------------------------------------------------------------------------------------------------------------------    LABs:  CBC Latest Ref Rng & Units 10/18/2019 10/16/2019 06/28/2019  WBC 4.0 - 10.5 K/uL 9.8 9.7 7.0  Hemoglobin 12.0 - 15.0 g/dL 11.9(L) 12.5 13.1  Hematocrit 36 - 46 % 36.4 38.1 38.1  Platelets 150 - 400 K/uL 230 227 281   CMP Latest Ref Rng & Units 10/18/2019 10/17/2019 10/16/2019  Glucose 70 - 99 mg/dL 158(H) 159(H) 151(H)  BUN 8 - 23 mg/dL 19 9 7(L)  Creatinine 0.44 - 1.00 mg/dL 0.89 0.78 0.69  Sodium 135 - 145 mmol/L 137 141 145  Potassium 3.5 - 5.1 mmol/L 3.6 3.8 3.6  Chloride 98 - 111 mmol/L 95(L) 101 102  CO2 22 - 32 mmol/L 30 29 31   Calcium 8.9 - 10.3 mg/dL 7.8(L) 8.7(L) 8.8(L)  Total Protein 6.5 - 8.1 g/dL - - -  Total Bilirubin 0.3 - 1.2 mg/dL - - -  Alkaline Phos 38 - 126 U/L - - -  AST 15 - 41 U/L - - -  ALT 0 - 44 U/L - - -       Micro Results Recent Results (from the past 240 hour(s))  SARS Coronavirus 2 by RT PCR (hospital order, performed in [facility] hospital lab) Nasopharyngeal Nasopharyngeal Swab     Status: None   Collection Time: 10/16/19  5:10 PM   Specimen: Nasopharyngeal Swab  Result Value Ref Range Status   SARS Coronavirus 2 NEGATIVE NEGATIVE Final    Comment: (NOTE) SARS-CoV-2 target nucleic acids are NOT DETECTED.  The SARS-CoV-2 RNA is generally detectable in upper and lower r  Performed at [facility], [place] [address] [place], [place]  [address]     Radiology Reports DG Chest 2 View  Result Date: 10/16/2019 CLINICAL DATA:  Cough. Cough for 2 weeks. Patient was diagnosed with Covid-19 in April. EXAM: CHEST - 2 VIEW COMPARISON:  In 06/24/2019 FINDINGS: The heart is enlarged but stable in configuration. There are bilateral pleural effusions. There is mild perihilar increased opacity and interstitial lines suggestive of early edema. Infectious process could have a similar appearance. There is increased bibasilar atelectasis. IMPRESSION: Stable cardiomegaly. Early edema versus inflammatory process. Small bilateral pleural effusions, increased. Electronically Signed   By: Nolon Nations M.D.   On: 10/16/2019 12:09   VAS [place] LOWER EXTREMITY VENOUS (DVT) (ONLY [facility] & [facility])  Result Date: 10/16/2019  Lower Venous DVTStudy Indications: SOB, and History of Covid-19 infection 4/21, asthma.  Limitations: Movement. Comparison Study: No prior study Performing Technologist: Sharion Dove RVS  Examination Guidelines: A complete evaluation includes B-mode imaging, spectral Doppler, color Doppler, and power Doppler as needed of all accessible portions of each vessel. Bilateral testing is considered an integral part of a complete examination. Limited examinations for reoccurring indications may be performed as noted. The reflux por    Summary: BILATERAL: - No evidence of deep vein thrombosis seen in the lower extremities, bilaterally. -   *See table(s) above for measurements and observations.    Preliminary     SIGNED: Deatra James, MD, FACP, FHM. [facility],  Pager (please use [email or web address] to  page/text)  If 7PM-7AM, please contact night-coverage [email or web address] [facility] 10/18/2019, 10:58 AM

## 2019-10-19 LAB — BASIC METABOLIC PANEL
Anion gap: 12 (ref 5–15)
BUN: 16 mg/dL (ref 8–23)
CO2: 31 mmol/L (ref 22–32)
Calcium: 8.1 mg/dL — ABNORMAL LOW (ref 8.9–10.3)
Chloride: 100 mmol/L (ref 98–111)
Creatinine, Ser: 0.85 mg/dL (ref 0.44–1.00)
GFR calc Af Amer: >60 mL/min (ref >60)
GFR calc non Af Amer: >60 mL/min (ref >60)
Glucose, Bld: 140 mg/dL — ABNORMAL HIGH (ref 70–99)
Potassium: 3.3 mmol/L — ABNORMAL LOW (ref 3.5–5.1)
Sodium: 143 mmol/L (ref 135–145)

## 2019-10-19 LAB — CBC
HCT: 35.5 % — ABNORMAL LOW (ref 36.0–46.0)
Hemoglobin: 11.8 g/dL — ABNORMAL LOW (ref 12.0–15.0)
MCH: 30.6 pg (ref 26.0–34.0)
MCHC: 33.2 g/dL (ref 30.0–36.0)
MCV: 92 fL (ref 80.0–100.0)
Platelets: 206 10*3/uL (ref 150–400)
RBC: 3.86 MIL/uL — ABNORMAL LOW (ref 3.87–5.11)
RDW: 12.9 % (ref 11.5–15.5)
WBC: 8.4 10*3/uL (ref 4.0–10.5)
nRBC: 0 % (ref 0.0–0.2)

## 2019-10-19 LAB — GLUCOSE, CAPILLARY
Glucose-Capillary: 118 mg/dL — ABNORMAL HIGH (ref 70–99)
Glucose-Capillary: 206 mg/dL — ABNORMAL HIGH (ref 70–99)
Glucose-Capillary: 132 mg/dL — ABNORMAL HIGH (ref 70–99)
Glucose-Capillary: 194 mg/dL — ABNORMAL HIGH (ref 70–99)

## 2019-10-19 MED ORDER — ALUM & MAG HYDROXIDE-SIMETH 200-200-20 MG/5ML PO SUSP
30.0000 mL | Freq: Four times a day (QID) | ORAL | Status: DC | PRN
  Administered 2019-10-19: 30 mL via ORAL
  Filled 2019-10-19: qty 30

## 2019-10-19 NOTE — Progress Notes (Signed)
PROGRESS NOTE    Patient: Nicole [name]                            PCP: [name], MD                    DOB: Jul 11, 1945            DOA: 10/16/2019 [id number]             DOS: Nicole [name]   LOS: 3 days   Date of Service: The patient was seen and examined on [name]  Subjective:   Patient was seen and examined this morning, stable no acute distress. Overnight patient was briefly taken off oxygen, she quickly desatted. But at rest with 1 L of oxygen, patient satting greater than 90% Denies any chest pain Currently satting 99% on 1 L of oxygen    Brief Narrative:   Nicole [name] is a 74 y.o. female with medical history significant of fibromyalgia, GERD, DM. She was recently hospitalized with Covid 19 PNA. She presents to [facility] for evaluation of increasing SOB and dyspnea on exertion along with weakness. 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 NO prior cardiac hx with negative nuclear stress test in 2015.  -Now with cardiomegaly, new since April /21, elevated BNP, early pulmonary edema raising Lower extremity Doppler negative              Lasix 40 mg another IV dose, will be switched to p.o. in a.m, on beta-blocker,              Metoprolol, Entresto, cardiology considering adding ARB, Aldactone, Farxiga              2D echo >> severe global hypokinesis, ejection fraction 20-25%             VQ scan >>> negative for PE             Cardiology consulted -appreciate further evaluation recommendation possible cath  Acute respiratory distress /shortness of breath/dyspnea on exertion -Likely due to above newly diagnosed CHF -Failed trial of weaning off oxygen, off oxygen patient desatted, Progressive shortness and hypoxia especially with exertion Currently satting 99% on 1-2  L oxygen  - D-dimer is negative and LE venous doppler negative  -VQ scan negative for PE --no other signs of hypoxia due to PE (negative tachypnea, tachycardia -History of asthma, no signs of exacerbation, negative for any wheezing -History of recent Covid, no signs pulmonary lung damage  HTN  -Accelerated hypertension on admission -With current medication blood pressure has improved-stabilized  -no prior h/o or tx for HTN. BP is now elevated. -We will continue with Lasix IV today, switch to p.o. in a.m., metoprolol    GERD - continue home meds  DM II - POA no medications. Last A1C 6.5% April '21 Plan     A1C: 6.5  Low carb diet  Depression/anxiety  - continue home meds -Continue Cymbalta, as needed Ativan  Debility-generalized weaknesses -PT OT consult for  evaluation recommendation   DVT prophylaxis: lovenox Code Status: full code  Family Communication: patient asked that family/contacts not be called due to the hour  Disposition Plan: home when medically stable  Consults called: none  Admission status: inpatient/tele     ---------------------------------------------------------------------------------------------------------------------------------------------  DVT prophylaxis:  SCD/Compression stockings and Lovenox SQ Code Status:   Code Status: Full Code Family Communication: Patient is called on the phone and updated daily Ms. Nicole [name] [phone] The above findings and plan of care has been discussed with patient in detail,  they expressed understanding and agreement of above. -Advance care  planning has been discussed.   Admission status:    Status is: Inpatient  Remains inpatient appropriate because:Inpatient level of care appropriate due to severity of illness   Dispo: The patient is from: Home              Anticipated d/c is to: Home              Anticipated d/c date is: 2 days              Patient currently is not medically stable to d/c.        Procedures:   No admission procedures for hospital encounter.     Antimicrobials:  Anti-infectives (From admission, onward)   None       Medication:  . aspirin  81 mg Oral Daily  . dapagliflozin propanediol  10 mg Oral Daily  . DULoxetine  60 mg Oral Daily  . enoxaparin (LOVENOX) injection  40 mg Subcutaneous Q24H  . fenofibrate  160 mg Oral QHS  . furosemide  40 mg Oral Daily  . insulin aspart  0-15 Units Subcutaneous TID WC  . LORazepam  1 mg Oral BID  . mouth rinse  15 mL Mouth Rinse BID  . metoprolol succinate  25 mg Oral Daily  . mometasone-formoterol  2 puff Inhalation BID  . pantoprazole  40 mg Oral Daily  . [START ON 10/20/2019] sacubitril-valsartan  1 tablet Oral BID  . sodium chloride flush  3 mL Intravenous Q12H    sodium chloride, albuterol, fluticasone, oxyCODONE-acetaminophen **AND** oxyCODONE, sodium chloride flush   Objective:   Vitals:   10/18/19 2119 10/19/19 0443 10/19/19 0500 10/19/19 0922  BP: 108/67 111/65  111/72  Pulse: 80 80  89  Resp: 18 20 16    Temp: 98.5 F (36.9 C) 98.2 F (36.8 C)    TempSrc: Oral Oral    SpO2: 98% 99%    Weight:   71.5 kg   Height:        Intake/Output Summary (Last 24 hours) at [name] 1217 Last data filed at [name] 0117 Gross per 24 hour  Intake --  Output 750 ml  Net -750 ml   Filed Weights   10/17/19 0103 10/18/19 1005 10/19/19 0500  Weight: 72 kg 72.2 kg 71.5 kg     Examination:      Physical Exam:   General:  Alert, oriented, cooperative, no distress;   HEENT:  Normocephalic, PERRL, otherwise with in Normal  limits   Neuro:  CNII-XII intact. , normal motor and sensation, reflexes intact   Lungs:   Clear to auscultation BL, Respirations unlabored, no wheezes / crackles  Cardio:    S1/S2, RRR, No murmure, No Rubs or Gallops   Abdomen:   Soft, non-tender, bowel sounds active  all four quadrants,  no guarding or peritoneal signs.  Muscular skeletal:  Limited exam - in bed, able to move all 4 extremities, Normal strength,  2+ pulses,  symmetric, No pitting edema  Skin:  Dry, warm to touch, negative for any Rashes, No open wounds  Wounds: Please see nursing documentation            ------------------------------------------------------------------------------------------------------------------------------------------    LABs:  CBC Latest Ref Rng & Units [name] 10/18/2019 10/16/2019  WBC 4.0 - 10.5 K/uL 8.4 9.8 9.7  Hemoglobin 12.0 - 15.0 g/dL 11.8(L) 11.9(L) 12.5  Hematocrit 36 - 46 % 35.5(L) 36.4 38.1  Platelets 150 - 400 K/uL 206 230 227   CMP Latest Ref Rng & Units [name] 10/18/2019 10/17/2019  Glucose 70 - 99 mg/dL 140(H) 158(H) 159(H)  BUN 8 - 23 mg/dL 16 19 9   Creatinine 0.44 - 1.00 mg/dL 0.85 0.89 0.78  Sodium 135 - 145 mmol/L 143 137 141  Potassium 3.5 - 5.1 mmol/L 3.3(L) 3.6 3.8  Chloride 98 - 111 mmol/L 100 95(L) 101  CO2 22 - 32 mmol/L 31 30 29   Calcium 8.9 - 10.3 mg/dL 8.1(L) 7.8(L) 8.7(L)  Total Protein 6.5 - 8.1 g/dL - - -  Total Bilirubin 0.3 - 1.2 mg/dL - - -  Alkaline Phos 38 - 126 U/L - - -  AST 15 - 41 U/L - - -  ALT 0 - 44 U/L - - -       Micro Results Recent Results (from the past 240 hour(s))  SARS Coronavirus 2 by RT PCR (hospital order, performed in [facility] hospital lab) Nasopharyngeal Nasopharyngeal Swab     Status: None   Collection Time: 10/16/19  5:10 [name]   Specimen: Nasopharyngeal Swab  Result Value Ref Range Status   SARS Coronavirus 2 NEGATIVE NEGATIVE Final    Comment: (NOTE) SARS-CoV-2 target nucleic acids are NOT DETECTED.  The  SARS-CoV-2 RNA is generally detectable in upper and lower r  Performed at [facility], [place] [address] [place], [place] [address]     Radiology Reports DG Chest 2 View  Result Date: 10/16/2019 CLINICAL DATA:  Cough. Cough for 2 weeks. Patient was diagnosed with Covid-19 in April. EXAM: CHEST - 2 VIEW COMPARISON:  In 06/24/2019 FINDINGS: The heart is enlarged but stable in configuration. There are bilateral pleural effusions. There is mild perihilar increased opacity and interstitial lines suggestive of early edema. Infectious process could have a similar appearance. There is increased bibasilar atelectasis. IMPRESSION: Stable cardiomegaly. Early edema versus inflammatory process. Small bilateral pleural effusions, increased. Electronically Signed   By: Nolon Nations M.D.   On: 10/16/2019 12:09   VAS [place] LOWER EXTREMITY VENOUS (DVT) (ONLY [facility] & [facility])  Result Date: 10/16/2019  Lower Venous DVTStudy Indications: SOB, and History of Covid-19 infection 4/21, asthma.  Limitations: Movement. Comparison Study: No prior study Performing Technologist: Sharion Dove RVS  Examination Guidelines: A complete evaluation includes B-mode imaging, spectral Doppler, color Doppler, and power Doppler as needed of all accessible portions of each vessel. Bilateral testing is considered an integral part of a complete examination. Limited examinations for reoccurring indications may be performed as noted. The reflux por    Summary: BILATERAL: - No evidence of deep vein thrombosis seen in the lower extremities, bilaterally. -   *See table(s) above for measurements and observations.    Preliminary     SIGNED: Deatra James, MD, FACP, FHM. [facility],  Pager (please use [email or web address] to page/text)  If 7PM-7AM, please contact night-coverage [email or web address] [facility] Nicole [name]

## 2019-10-19 NOTE — Progress Notes (Addendum)
Progress Note  Patient Name: Nicole [name] Date of Encounter: 10/19/2019  [facility] Cardiologist: Peter Martinique, MD   Subjective   No chest pain but continues with SOB , especially DOE  Inpatient Medications    Scheduled Meds: . aspirin  81 mg Oral Daily  . dapagliflozin propanediol  10 mg Oral Daily  . DULoxetine  60 mg Oral Daily  . enoxaparin (LOVENOX) injection  40 mg Subcutaneous Q24H  . fenofibrate  160 mg Oral QHS  . furosemide  40 mg Oral Daily  . insulin aspart  0-15 Units Subcutaneous TID WC  . LORazepam  1 mg Oral BID  . mouth rinse  15 mL Mouth Rinse BID  . metoprolol succinate  25 mg Oral Daily  . mometasone-formoterol  2 puff Inhalation BID  . pantoprazole  40 mg Oral Daily  . [START ON 10/20/2019] sacubitril-valsartan  1 tablet Oral BID  . sodium chloride flush  3 mL Intravenous Q12H   Continuous Infusions: . sodium chloride     PRN Meds: sodium chloride, albuterol, fluticasone, oxyCODONE-acetaminophen **AND** oxyCODONE, sodium chloride flush   Vital Signs    Vitals:   10/18/19 2119 10/19/19 0443 10/19/19 0500 10/19/19 0922  BP: 108/67 111/65  111/72  Pulse: 80 80  89  Resp: 18 20 16    Temp: 98.5 F (36.9 C) 98.2 F (36.8 C)    TempSrc: Oral Oral    SpO2: 98% 99%    Weight:   71.5 kg   Height:        Intake/Output Summary (Last 24 hours) at 10/19/2019 0951 Last data filed at 10/19/2019 0117 Gross per 24 hour  Intake --  Output 750 ml  Net -750 ml   Last 3 Weights 10/19/2019 10/18/2019 10/17/2019  Weight (lbs) 157 lb 10.1 oz 159 lb 1.6 oz 158 lb 12.8 oz  Weight (kg) 71.5 kg 72.167 kg 72.031 kg      Telemetry    SR with PACs  - Personally Reviewed  ECG    No new - Personally Reviewed  Physical Exam   GEN: No acute distress.   Neck: No JVD Cardiac: RRR, no murmurs, rubs, or gallops.  Respiratory: Clear to auscultation bilaterally. GI: Soft, nontender, non-distended  MS: No edema; No deformity. Neuro:  Nonfocal  Psych: Normal  affect   Labs    High Sensitivity Troponin:   Recent Labs  Lab 10/16/19 1710  TROPONINIHS 26*      Chemistry Recent Labs  Lab 10/17/19 0453 10/18/19 0450 10/19/19 0423  NA 141 137 143  K 3.8 3.6 3.3*  CL 101 95* 100  CO2 29 30 31   GLUCOSE 159* 158* 140*  BUN 9 19 16   CREATININE 0.78 0.89 0.85  CALCIUM 8.7* 7.8* 8.1*  GFRNONAA >60 >60 >60  GFRAA >60 >60 >60  ANIONGAP 11 12 12      Hematology Recent Labs  Lab 10/16/19 1615 10/18/19 0450 10/19/19 0423  WBC 9.7 9.8 8.4  RBC 4.10 3.88 3.86*  HGB 12.5 11.9* 11.8*  HCT 38.1 36.4 35.5*  MCV 92.9 93.8 92.0  MCH 30.5 30.7 30.6  MCHC 32.8 32.7 33.2  RDW 13.1 12.9 12.9  PLT 227 230 206    BNP Recent Labs  Lab 10/16/19 1615  BNP 504.9*     DDimer  Recent Labs  Lab 10/16/19 1710  DDIMER 0.61*     Radiology    NM Pulmonary Perfusion  Result Date: 10/17/2019 CLINICAL DATA:  Chest pain EXAM: NUCLEAR MEDICINE  PERFUSION LUNG SCAN TECHNIQUE: Perfusion images were obtained in multiple projections after intravenous injection of radiopharmaceutical. Ventilation scans intentionally deferred if perfusion scan and chest x-ray adequate for interpretation during COVID 19 epidemic. RADIOPHARMACEUTICALS:  4.4 mCi Tc-56m MAA IV COMPARISON:  Chest x-ray 10/16/2019 FINDINGS: No perfusion defects seen to suggest pulmonary embolus. IMPRESSION: No evidence of pulmonary embolus. Electronically Signed   By: Rolm Baptise M.D.   On: 10/17/2019 18:05    Cardiac Studies   Echo 10/16/19  IMPRESSIONS    1. Left ventricular ejection fraction, by estimation, is 20 to 25%. The  left ventricle has severely decreased function. The left ventricle  demonstrates global hypokinesis. The left ventricular internal cavity size  was mildly dilated. Left ventricular  diastolic parameters are consistent with Grade II diastolic dysfunction  (pseudonormalization). Elevated left ventricular end-diastolic pressure.  2. Right ventricular systolic  function is normal. The right ventricular  size is normal. There is normal pulmonary artery systolic pressure. The  estimated right ventricular systolic pressure is 56.3 mmHg.  3. Left atrial size was severely dilated.  4. The mitral valve is normal in structure. Mild mitral valve  regurgitation. No evidence of mitral stenosis.  5. The aortic valve is tricuspid. Aortic valve regurgitation is trivial.  Mild to moderate aortic valve sclerosis/calcification is present, without  any evidence of aortic stenosis.  6. The inferior vena cava is normal in size with greater than 50%  respiratory variability, suggesting right atrial pressure of 3 mmHg.  7. There is right bowing of the interatrial septum, suggestive of  elevated left atrial pressure. No atrial level shunt detected by color  flow Doppler.   FINDINGS  Left Ventricle: Left ventricular ejection fraction, by estimation, is 20  to 25%. The left ventricle has severely decreased function. The left  ventricle demonstrates global hypokinesis. Definity contrast agent was  given IV to delineate the left  ventricular endocardial borders. The left ventricular internal cavity size  was mildly dilated. There is no left ventricular hypertrophy. Left  ventricular diastolic parameters are consistent with Grade II diastolic  dysfunction (pseudonormalization).  Elevated left ventricular end-diastolic pressure.   Right Ventricle: The right ventricular size is normal. No increase in  right ventricular wall thickness. Right ventricular systolic function is  normal. There is normal pulmonary artery systolic pressure. The tricuspid  regurgitant velocity is 2.32 m/s, and  with an assumed right atrial pressure of 3 mmHg, the estimated right  ventricular systolic pressure is 87.5 mmHg.   Left Atrium: Left atrial size was severely dilated.   Right Atrium: Right atrial size was normal in size.   Pericardium: A small pericardial effusion is present.  The pericardial  effusion is anterior to the right ventricle.   Mitral Valve: The mitral valve is normal in structure. Normal mobility of  the mitral valve leaflets. Mild mitral valve regurgitation. No evidence of  mitral valve stenosis.   Tricuspid Valve: The tricuspid valve is normal in structure. Tricuspid  valve regurgitation is mild . No evidence of tricuspid stenosis.   Aortic Valve: The aortic valve is tricuspid. . There is mild thickening  and mild calcification of the aortic valve. Aortic valve regurgitation is  trivial. Mild to moderate aortic valve sclerosis/calcification is present,  without any evidence of aortic  stenosis. There is mild thickening of the aortic valve. There is mild  calcification of the aortic valve.   Pulmonic Valve: The pulmonic valve was normal in structure. Pulmonic valve  regurgitation is mild. No evidence of  pulmonic stenosis.   Aorta: The aortic root is normal in size and structure.   Venous: The inferior vena cava is normal in size with greater than 50%  respiratory variability, suggesting right atrial pressure of 3 mmHg.   IAS/Shunts: There is right bowing of the interatrial septum, suggestive of  elevated left atrial pressure. No atrial level shunt detected by color  flow Doppler.    Patient Profile     74 y.o. female with a hx of DM-2, obesity, strong FH of CAD, and normal nuc study 2015, GERD, asthma and fibromyalgia she did have COVID PNA in April 2021 now with new cardiomyopathy.   Assessment & Plan    1. 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 Is neg  8786 since admit and now on 40 mg po lasix -though may need another day of IV pt did have COVID PNA in 06/2019  (d dimer 0.61 but neg VQ for PE)  She is neg 2457 since admit wt down to 71/5 Kg. 2. Cardiomyopathy with global hypokinesis and LV dilatation -now on BB after diuresing.  Would add Entresto. Also aldactone. Add farxiga today  Has had neg nuc in 2015 but strong FH  for CAD.  Would plan for cardiac cath but concern for anaphylactics reaction with dye - once improved from HF may try nuc study but if + still have issue with dye.  3. HTN elevated on admit now improved. With diuresis and BB.  4. DM-2 per IM 5. Hx of COVID 19 PNA 06/2019 -neg this admit.    6. Asthma no wheezes today       For questions or updates, please contact [place] Please consult [email or web address] for contact info under        Signed, Cecilie Kicks, NP  10/19/2019, 9:51 AM    Patient seen and examined with Serita Butcher NP.  Agree as above, with the following exceptions and changes as noted below. Feeling slighly better, keen to undergo cardiac cath but concerned about contrast reaction. Gen: NAD, CV: RRR, no murmurs, Lungs: clear, Abd: soft, Extrem: Warm, well perfused, no edema, Neuro/Psych: alert and oriented x 3, normal mood and affect. All available labs, radiology testing, previous records reviewed.   Heart Failure Therapy ACE-I/ARB/ARNI: start entresto 8/11. No ACE-I or ARB in setting of ARNI. BB: metoprolol succinate 25 mg daily MRA: consider adding spironolactone if other therapy well tolerated. SGLT2I: farxiga 10 mg daily Diuretic: lasix 40 mg daily  She is interested in trying cardiac cath. We spent 15-20 minutes just discussing risks of prior contrast reaction in the 1980s, and what sounds like a need for intracardiac EPI injection due to anyphylasix. Will review with interventional cardiology for current protocols in this scenario. Tentatively plan for premedication and R/LHC on Thursday.   >35 mins on this encounter, >50% face to face with patient.  Elouise Munroe, MD

## 2019-10-20 LAB — CBC
HCT: 36.1 % (ref 36.0–46.0)
Hemoglobin: 11.9 g/dL — ABNORMAL LOW (ref 12.0–15.0)
MCH: 30.4 pg (ref 26.0–34.0)
MCHC: 33 g/dL (ref 30.0–36.0)
MCV: 92.3 fL (ref 80.0–100.0)
Platelets: 223 10*3/uL (ref 150–400)
RBC: 3.91 MIL/uL (ref 3.87–5.11)
RDW: 12.9 % (ref 11.5–15.5)
WBC: 8.9 10*3/uL (ref 4.0–10.5)
nRBC: 0 % (ref 0.0–0.2)

## 2019-10-20 LAB — GLUCOSE, CAPILLARY
Glucose-Capillary: 113 mg/dL — ABNORMAL HIGH (ref 70–99)
Glucose-Capillary: 143 mg/dL — ABNORMAL HIGH (ref 70–99)
Glucose-Capillary: 187 mg/dL — ABNORMAL HIGH (ref 70–99)
Glucose-Capillary: 100 mg/dL — ABNORMAL HIGH (ref 70–99)

## 2019-10-20 LAB — BASIC METABOLIC PANEL
Anion gap: 11 (ref 5–15)
BUN: 18 mg/dL (ref 8–23)
CO2: 31 mmol/L (ref 22–32)
Calcium: 8.3 mg/dL — ABNORMAL LOW (ref 8.9–10.3)
Chloride: 100 mmol/L (ref 98–111)
Creatinine, Ser: 0.9 mg/dL (ref 0.44–1.00)
GFR calc Af Amer: >60 mL/min (ref >60)
GFR calc non Af Amer: >60 mL/min (ref >60)
Glucose, Bld: 114 mg/dL — ABNORMAL HIGH (ref 70–99)
Potassium: 3.2 mmol/L — ABNORMAL LOW (ref 3.5–5.1)
Sodium: 142 mmol/L (ref 135–145)

## 2019-10-20 MED ORDER — SODIUM CHLORIDE 0.9 % IV SOLN: 250.0000 mL | INTRAVENOUS | Status: DC | PRN

## 2019-10-20 MED ORDER — DIPHENHYDRAMINE HCL 50 MG PO CAPS
50.0000 mg | ORAL_CAPSULE | Freq: Once | ORAL | Status: AC
  Administered 2019-10-21: 50 mg via ORAL
  Filled 2019-10-20: qty 1

## 2019-10-20 MED ORDER — DIPHENHYDRAMINE HCL 50 MG/ML IJ SOLN: 50.0000 mg | Freq: Once | INTRAMUSCULAR | Status: AC

## 2019-10-20 MED ORDER — ASPIRIN 81 MG PO CHEW
81.0000 mg | CHEWABLE_TABLET | ORAL | Status: AC
  Administered 2019-10-21: 81 mg via ORAL
  Filled 2019-10-20: qty 1

## 2019-10-20 MED ORDER — POTASSIUM CHLORIDE CRYS ER 20 MEQ PO TBCR
40.0000 meq | EXTENDED_RELEASE_TABLET | Freq: Once | ORAL | Status: AC
  Administered 2019-10-20: 40 meq via ORAL
  Filled 2019-10-20: qty 2

## 2019-10-20 MED ORDER — POTASSIUM CHLORIDE CRYS ER 20 MEQ PO TBCR
40.0000 meq | EXTENDED_RELEASE_TABLET | Freq: Two times a day (BID) | ORAL | Status: DC
  Administered 2019-10-20 – 2019-10-22 (×3): 40 meq via ORAL
  Filled 2019-10-20 (×3): qty 2

## 2019-10-20 MED ORDER — POTASSIUM CHLORIDE CRYS ER 20 MEQ PO TBCR: 40.0000 meq | EXTENDED_RELEASE_TABLET | Freq: Two times a day (BID) | ORAL | Status: DC

## 2019-10-20 MED ORDER — METHYLPREDNISOLONE SODIUM SUCC 125 MG IJ SOLR
40.0000 mg | INTRAMUSCULAR | Status: AC
  Administered 2019-10-21 (×3): 40 mg via INTRAVENOUS
  Filled 2019-10-20 (×3): qty 2

## 2019-10-20 MED ORDER — SODIUM CHLORIDE 0.9% FLUSH
3.0000 mL | Freq: Two times a day (BID) | INTRAVENOUS | Status: DC
  Administered 2019-10-20: 3 mL via INTRAVENOUS

## 2019-10-20 MED ORDER — SODIUM CHLORIDE 0.9 % IV SOLN: INTRAVENOUS | Status: DC

## 2019-10-20 MED ORDER — SODIUM CHLORIDE 0.9% FLUSH: 3.0000 mL | INTRAVENOUS | Status: DC | PRN

## 2019-10-20 NOTE — Evaluation (Signed)
Occupational Therapy Evaluation Patient Details Name: Nicole [name] MRN: [id number] DOB: 11-11-1945 Today's Date: 10/20/2019    History of Present Illness Nicole [name] is a 74 y.o. female with medical history significant of fibromyalgia, GERD, DM.  Covid 19 PNA in 4/2o21, negative this admission.. She presents to [facility] for evaluation of increasing SOB and dyspnea on exertion along with weakness.   Clinical Impression   Nicole [name] is a 74 year old woman admitted to hospital with shortness of breath and found to have CHF. On evaluation patient demonstrates ability to perform functional mobility and ADLs. Patient required use of grab bar to stand from toilet and reports difficulty with toilet transfer at home. Therapist recommended BSC and shower chair for safety at home. Patient reports feeling like her baseline and reports interest in [facility]. No further OT needs at this time.    Follow Up Recommendations  No OT follow up    Equipment Recommendations  3 in 1 bedside commode    Recommendations for Other Services       Precautions / Restrictions Precautions Precautions: None Precaution Comments: monitor sats Restrictions Weight Bearing Restrictions: No      Mobility Bed Mobility               General bed mobility comments: patient seated in recliner  Transfers Overall transfer level: Modified independent               General transfer comment: Patient ambulated to bathroom without device. Uses one hand to hold onto furniture at times. patient reports decreased balance at baseline. Reports holding onto furniture at home.    Balance Overall balance assessment: Mild deficits observed, not formally tested                                         ADL either performed or assessed with clinical judgement   ADL Overall ADL's : Modified independent                                       General ADL Comments: Patient demonstrates  ability to donn socks, ambulate to bathroom and perform pseudotoileting task, stand at sink for grooming. Patient needed grab bar to stand from toilet. Reports having difficulty at hoem with standing from toilet.     Vision Baseline Vision/History: No visual deficits Vision Assessment?: No apparent visual deficits     Perception     Praxis      Pertinent Vitals/Pain Pain Assessment: No/denies pain Faces Pain Scale: Hurts little more Pain Location: HA Pain Descriptors / Indicators: Aching Pain Intervention(s): Monitored during session     Hand Dominance     Extremity/Trunk Assessment Upper Extremity Assessment Upper Extremity Assessment: Overall WFL for tasks assessed   Lower Extremity Assessment Lower Extremity Assessment: Defer to PT evaluation   Cervical / Trunk Assessment Cervical / Trunk Assessment: Normal   Communication     Cognition Arousal/Alertness: Awake/alert Behavior During Therapy: WFL for tasks assessed/performed Overall Cognitive Status: Within Functional Limits for tasks assessed                                     General Comments       Exercises  Shoulder Instructions      Home Living Family/patient expects to be discharged to:: Private residence Living Arrangements: Alone Available Help at Discharge: Family;Available 24 hours/day Type of Home: House Home Access: Level entry     Home Layout: One level     Bathroom Shower/Tub: Teacher, early years/pre: Standard     Home Equipment: None          Prior Functioning/Environment Level of Independence: Independent                 OT Problem List:        OT Treatment/Interventions:      OT Goals(Current goals can be found in the care plan section) Acute Rehab OT Goals Patient Stated Goal: to go home, would like a BSC OT Goal Formulation: All assessment and education complete, DC therapy  OT Frequency:     Barriers to D/C:             Co-evaluation              AM-PAC OT "6 Clicks" Daily Activity     Outcome Measure Help from another person eating meals?: None Help from another person taking care of personal grooming?: None Help from another person toileting, which includes using toliet, bedpan, or urinal?: None Help from another person bathing (including washing, rinsing, drying)?: None Help from another person to put on and taking off regular upper body clothing?: None Help from another person to put on and taking off regular lower body clothing?: None 6 Click Score: 24   End of Session Nurse Communication:  (okay to see per Nursing)  Activity Tolerance: Patient tolerated treatment well Patient left: in chair;with call bell/phone within reach  OT Visit Diagnosis: Muscle weakness (generalized) (M62.81)                Time: 0973-5329 OT Time Calculation (min): 15 min Charges:  OT General Charges $OT Visit: 1 Visit OT Evaluation $OT Eval Low Complexity: 1 Low  Jevan Gaunt, OTR/L [place]  Office [phone] Pager: [phone]   Lenward Chancellor 10/20/2019, 2:12 PM

## 2019-10-20 NOTE — Evaluation (Signed)
Physical Therapy Evaluation Patient Details Name: Nicole [name] MRN: [id number] DOB: Nov 03, 1945 Today's Date: 10/20/2019   History of Present Illness  Nicole [name] is a 74 y.o. female with medical history significant of fibromyalgia, GERD, DM.  Covid 19 PNA in 4/2o21, negative this admission.. She presents to [facility] for evaluation of increasing SOB and dyspnea on exertion along with weakness.  Clinical Impression  The patient is mobilizing on RA x 400', SPO2 100%, HR max 103. Patient mobilizing well.Patient plans to Dc back home. Pt admitted with above diagnosis.  Pt currently with functional limitations due to the deficits listed below (see PT Problem List). Pt will benefit from skilled PT to increase their independence and safety with mobility to allow discharge to the venue listed below.       Follow Up Recommendations No PT follow up    Equipment Recommendations  None recommended by PT    Recommendations for Other Services       Precautions / Restrictions Precautions Precaution Comments: monitor sats      Mobility  Bed Mobility               General bed mobility comments: standing at bedside  Transfers Overall transfer level: Modified independent                  Ambulation/Gait Ambulation/Gait assistance: Supervision;Min guard Gait Distance (Feet): 400 Feet Assistive device: None;1 person hand held assist Gait Pattern/deviations: Step-through pattern;Drifts right/left     General Gait Details: intermittent HHA, 1 sway balance loss and reports a dizzy spell when turned into room. which cleared.  Stairs            Wheelchair Mobility    Modified Rankin (Stroke Patients Only)       Balance Overall balance assessment: Mild deficits observed, not formally tested                                           Pertinent Vitals/Pain Pain Assessment: Faces Faces Pain Scale: Hurts little more Pain Location: HA Pain Descriptors  / Indicators: Aching Pain Intervention(s): Monitored during session    Home Living Family/patient expects to be discharged to:: Private residence Living Arrangements: Alone Available Help at Discharge: Family;Available 24 hours/day Type of Home: House Home Access: Level entry     Home Layout: One level Home Equipment: None      Prior Function Level of Independence: Independent               Hand Dominance        Extremity/Trunk Assessment        Lower Extremity Assessment Lower Extremity Assessment: Overall WFL for tasks assessed    Cervical / Trunk Assessment Cervical / Trunk Assessment: Normal  Communication      Cognition Arousal/Alertness: Awake/alert Behavior During Therapy: WFL for tasks assessed/performed Overall Cognitive Status: Within Functional Limits for tasks assessed                                        General Comments      Exercises     Assessment/Plan    PT Assessment Patient needs continued PT services  PT Problem List Decreased strength;Decreased activity tolerance       PT Treatment Interventions Gait training;Functional mobility training;Patient/family education;Therapeutic  activities    PT Goals (Current goals can be found in the Care Plan section)  Acute Rehab PT Goals Patient Stated Goal: to breathe better when walking PT Goal Formulation: With patient Time For Goal Achievement: 11/03/19 Potential to Achieve Goals: Good    Frequency Min 2X/week   Barriers to discharge        Co-evaluation               AM-PAC PT "6 Clicks" Mobility  Outcome Measure Help needed turning from your back to your side while in a flat bed without using bedrails?: None Help needed moving from lying on your back to sitting on the side of a flat bed without using bedrails?: None Help needed moving to and from a bed to a chair (including a wheelchair)?: None Help needed standing up from a chair using your arms  (e.g., wheelchair or bedside chair)?: None Help needed to walk in hospital room?: A Little Help needed climbing 3-5 steps with a railing? : A Little 6 Click Score: 22    End of Session   Activity Tolerance: Patient tolerated treatment well Patient left: in chair;with call bell/phone within reach Nurse Communication: Mobility status PT Visit Diagnosis: Muscle weakness (generalized) (M62.81);Difficulty in walking, not elsewhere classified (R26.2)    Time: 1027-2536 PT Time Calculation (min) (ACUTE ONLY): 12 min   Charges:   PT Evaluation $PT Eval Low Complexity: [facility] Acute Rehabilitation Services Pager [phone] Office [phone]   Claretha Cooper 10/20/2019, 10:20 AM

## 2019-10-20 NOTE — H&P (View-Only) (Signed)
Progress Note  Patient Name: Nicole [name] Date of Encounter: 10/20/2019  [facility] Cardiologist: Peter Martinique, MD   Subjective   Some indigestion type pain.  Can be down to 20-30 degrees in bed without SOB but does need 02     Inpatient Medications    Scheduled Meds: . aspirin  81 mg Oral Daily  . dapagliflozin propanediol  10 mg Oral Daily  . DULoxetine  60 mg Oral Daily  . enoxaparin (LOVENOX) injection  40 mg Subcutaneous Q24H  . fenofibrate  160 mg Oral QHS  . furosemide  40 mg Oral Daily  . insulin aspart  0-15 Units Subcutaneous TID WC  . LORazepam  1 mg Oral BID  . mouth rinse  15 mL Mouth Rinse BID  . metoprolol succinate  25 mg Oral Daily  . mometasone-formoterol  2 puff Inhalation BID  . pantoprazole  40 mg Oral Daily  . potassium chloride  40 mEq Oral Once  . sacubitril-valsartan  1 tablet Oral BID  . sodium chloride flush  3 mL Intravenous Q12H   Continuous Infusions: . sodium chloride     PRN Meds: sodium chloride, albuterol, alum & mag hydroxide-simeth, fluticasone, oxyCODONE-acetaminophen **AND** oxyCODONE, sodium chloride flush   Vital Signs    Vitals:   10/19/19 2013 10/19/19 2029 10/20/19 0500 10/20/19 0547  BP:  126/69  128/76  Pulse:  80  79  Resp:  18  20  Temp:  99.3 F (37.4 C)  98.3 F (36.8 C)  TempSrc:  Oral  Oral  SpO2: 100% 97%  100%  Weight:   71.5 kg   Height:        Intake/Output Summary (Last 24 hours) at 10/20/2019 0821 Last data filed at 10/20/2019 0329 Gross per 24 hour  Intake 240 ml  Output 1700 ml  Net -1460 ml   Last 3 Weights 10/20/2019 10/19/2019 10/18/2019  Weight (lbs) 157 lb 10.1 oz 157 lb 10.1 oz 159 lb 1.6 oz  Weight (kg) 71.5 kg 71.5 kg 72.167 kg      Telemetry    SR with PACs and PVCs - Personally Reviewed  ECG    No new - Personally Reviewed  Physical Exam   GEN: No acute distress.   Neck: No JVD sitting up in chair Cardiac: RRR, no murmurs, rubs, or gallops.  Respiratory: Clear to  diminished auscultation bilaterally. No rales rhonchi or wheezes GI: Soft, nontender, non-distended  MS: No edema; No deformity. Neuro:  Nonfocal  Psych: Normal affect   Labs    High Sensitivity Troponin:   Recent Labs  Lab 10/16/19 1710  TROPONINIHS 26*      Chemistry Recent Labs  Lab 10/18/19 0450 10/19/19 0423 10/20/19 0351  NA 137 143 142  K 3.6 3.3* 3.2*  CL 95* 100 100  CO2 30 31 31   GLUCOSE 158* 140* 114*  BUN 19 16 18   CREATININE 0.89 0.85 0.90  CALCIUM 7.8* 8.1* 8.3*  GFRNONAA >60 >60 >60  GFRAA >60 >60 >60  ANIONGAP 12 12 11      Hematology Recent Labs  Lab 10/18/19 0450 10/19/19 0423 10/20/19 0351  WBC 9.8 8.4 8.9  RBC 3.88 3.86* 3.91  HGB 11.9* 11.8* 11.9*  HCT 36.4 35.5* 36.1  MCV 93.8 92.0 92.3  MCH 30.7 30.6 30.4  MCHC 32.7 33.2 33.0  RDW 12.9 12.9 12.9  PLT 230 206 223    BNP Recent Labs  Lab 10/16/19 1615  BNP 504.9*  DDimer  Recent Labs  Lab 10/16/19 1710  DDIMER 0.61*     Radiology    No results found.  Cardiac Studies   Echo 10/16/19 IMPRESSIONS    1. Left ventricular ejection fraction, by estimation, is 20 to 25%. The  left ventricle has severely decreased function. The left ventricle  demonstrates global hypokinesis. The left ventricular internal cavity size  was mildly dilated. Left ventricular  diastolic parameters are consistent with Grade II diastolic dysfunction  (pseudonormalization). Elevated left ventricular end-diastolic pressure.  2. Right ventricular systolic function is normal. The right ventricular  size is normal. There is normal pulmonary artery systolic pressure. The  estimated right ventricular systolic pressure is 95.1 mmHg.  3. Left atrial size was severely dilated.  4. The mitral valve is normal in structure. Mild mitral valve  regurgitation. No evidence of mitral stenosis.  5. The aortic valve is tricuspid. Aortic valve regurgitation is trivial.  Mild to moderate aortic valve  sclerosis/calcification is present, without  any evidence of aortic stenosis.  6. The inferior vena cava is normal in size with greater than 50%  respiratory variability, suggesting right atrial pressure of 3 mmHg.  7. There is right bowing of the interatrial septum, suggestive of  elevated left atrial pressure. No atrial level shunt detected by color  flow Doppler.   FINDINGS  Left Ventricle: Left ventricular ejection fraction, by estimation, is 20  to 25%. The left ventricle has severely decreased function. The left  ventricle demonstrates global hypokinesis. Definity contrast agent was  given IV to delineate the left  ventricular endocardial borders. The left ventricular internal cavity size  was mildly dilated. There is no left ventricular hypertrophy. Left  ventricular diastolic parameters are consistent with Grade II diastolic  dysfunction (pseudonormalization).  Elevated left ventricular end-diastolic pressure.   Right Ventricle: The right ventricular size is normal. No increase in  right ventricular wall thickness. Right ventricular systolic function is  normal. There is normal pulmonary artery systolic pressure. The tricuspid  regurgitant velocity is 2.32 m/s, and  with an assumed right atrial pressure of 3 mmHg, the estimated right  ventricular systolic pressure is 88.4 mmHg.   Left Atrium: Left atrial size was severely dilated.   Right Atrium: Right atrial size was normal in size.   Pericardium: A small pericardial effusion is present. The pericardial  effusion is anterior to the right ventricle.   Mitral Valve: The mitral valve is normal in structure. Normal mobility of  the mitral valve leaflets. Mild mitral valve regurgitation. No evidence of  mitral valve stenosis.   Tricuspid Valve: The tricuspid valve is normal in structure. Tricuspid  valve regurgitation is mild . No evidence of tricuspid stenosis.   Aortic Valve: The aortic valve is tricuspid. . There is  mild thickening  and mild calcification of the aortic valve. Aortic valve regurgitation is  trivial. Mild to moderate aortic valve sclerosis/calcification is present,  without any evidence of aortic  stenosis. There is mild thickening of the aortic valve. There is mild  calcification of the aortic valve.   Pulmonic Valve: The pulmonic valve was normal in structure. Pulmonic valve  regurgitation is mild. No evidence of pulmonic stenosis.   Aorta: The aortic root is normal in size and structure.   Venous: The inferior vena cava is normal in size with greater than 50%  respiratory variability, suggesting right atrial pressure of 3 mmHg.   IAS/Shunts: There is right bowing of the interatrial septum, suggestive of  elevated left  atrial pressure. No atrial level shunt detected by color  flow Doppler.    Patient Profile     74 y.o. female  with a hx of DM-2, obesity, strong FH of CAD, and normal nuc study 2015, GERD, asthma and fibromyalgia she did have COVID PNA in April 2021now with new cardiomyopathy.   Assessment & Plan    1. 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 Is neg 4967 since admit and now on 40 mg po lasix -though may need another day of IVpt did have COVID PNA in 06/2019 (d dimer 0.61 but neg VQ for PE)  She is neg 4297 since admit wt down to 71.5 Kg. 2. Cardiomyopathy with global hypokinesis and LV dilatation -now on BB after diuresing. Would add Entresto. Also aldactone, farxigaHas had neg nuc in 2015 but strong FH for CAD.  Would plan for cardiac cath but concern for anaphylactics reaction with dye. Discussed in detail by MD. On schedule for Thursday.  Rt and Lt cath.will need  Premeds  Scheduled for 0900 10/21/19 with Dr. Claiborne Billings. 3. HTN elevated on admit now improved. With diuresis and BB. Now on entresto 4. DM-2 per IM 5. Hx of COVID 19 PNA 06/2019 -neg this admit. 6. Asthma no wheezes today  7. Hypokalemia supplementation ordered.        For  questions or updates, please contact [place] Please consult [email or web address] for contact info under        Signed, Cecilie Kicks, NP  10/20/2019, 8:21 AM    Patient seen and examined with Cecilie Kicks NP.  Agree as above, with the following exceptions and changes as noted below. She is breathing better and is no longer on supp O2. No wheezing. Gen: NAD, CV: RRR, no murmurs, Lungs: clear, Abd: soft, Extrem: Warm, well perfused, no edema, Neuro/Psych: alert and oriented x 3, normal mood and affect. All available labs, radiology testing, previous records reviewed. We have discussed R/LHC several times over the last few days. She is inclined to proceed. Given time since last contrast reaction, she feels comfortable moving forward with premedication and contrast. Reviewed with interventional cardiology - recommend premeds but feel would be reasonable to proceed. Plan r/lhc tomorrow.  INFORMED CONSENT: I have reviewed the risks, indications, and alternatives to cardiac catheterization, possible angioplasty, and stenting with the patient. Risks include but are not limited to bleeding, infection, vascular injury, stroke, myocardial infection, arrhythmia, kidney injury, radiation-related injury in the case of prolonged fluoroscopy use, emergency cardiac surgery, and death. The patient understands the risks of serious complication is 1-2 in 5916 with diagnostic cardiac cath and 1-2% or less with angioplasty/stenting.   Elouise Munroe, MD 10/20/19 1:56 PM

## 2019-10-20 NOTE — Progress Notes (Addendum)
Progress Note  Patient Name: Nicole [name] Date of Encounter: 10/20/2019  [facility] Cardiologist: Peter Martinique, MD   Subjective   Some indigestion type pain.  Can be down to 20-30 degrees in bed without SOB but does need 02     Inpatient Medications    Scheduled Meds: . aspirin  81 mg Oral Daily  . dapagliflozin propanediol  10 mg Oral Daily  . DULoxetine  60 mg Oral Daily  . enoxaparin (LOVENOX) injection  40 mg Subcutaneous Q24H  . fenofibrate  160 mg Oral QHS  . furosemide  40 mg Oral Daily  . insulin aspart  0-15 Units Subcutaneous TID WC  . LORazepam  1 mg Oral BID  . mouth rinse  15 mL Mouth Rinse BID  . metoprolol succinate  25 mg Oral Daily  . mometasone-formoterol  2 puff Inhalation BID  . pantoprazole  40 mg Oral Daily  . potassium chloride  40 mEq Oral Once  . sacubitril-valsartan  1 tablet Oral BID  . sodium chloride flush  3 mL Intravenous Q12H   Continuous Infusions: . sodium chloride     PRN Meds: sodium chloride, albuterol, alum & mag hydroxide-simeth, fluticasone, oxyCODONE-acetaminophen **AND** oxyCODONE, sodium chloride flush   Vital Signs    Vitals:   10/19/19 2013 10/19/19 2029 10/20/19 0500 10/20/19 0547  BP:  126/69  128/76  Pulse:  80  79  Resp:  18  20  Temp:  99.3 F (37.4 C)  98.3 F (36.8 C)  TempSrc:  Oral  Oral  SpO2: 100% 97%  100%  Weight:   71.5 kg   Height:        Intake/Output Summary (Last 24 hours) at 10/20/2019 0821 Last data filed at 10/20/2019 0329 Gross per 24 hour  Intake 240 ml  Output 1700 ml  Net -1460 ml   Last 3 Weights 10/20/2019 10/19/2019 10/18/2019  Weight (lbs) 157 lb 10.1 oz 157 lb 10.1 oz 159 lb 1.6 oz  Weight (kg) 71.5 kg 71.5 kg 72.167 kg      Telemetry    SR with PACs and PVCs - Personally Reviewed  ECG    No new - Personally Reviewed  Physical Exam   GEN: No acute distress.   Neck: No JVD sitting up in chair Cardiac: RRR, no murmurs, rubs, or gallops.  Respiratory: Clear to  diminished auscultation bilaterally. No rales rhonchi or wheezes GI: Soft, nontender, non-distended  MS: No edema; No deformity. Neuro:  Nonfocal  Psych: Normal affect   Labs    High Sensitivity Troponin:   Recent Labs  Lab 10/16/19 1710  TROPONINIHS 26*      Chemistry Recent Labs  Lab 10/18/19 0450 10/19/19 0423 10/20/19 0351  NA 137 143 142  K 3.6 3.3* 3.2*  CL 95* 100 100  CO2 30 31 31   GLUCOSE 158* 140* 114*  BUN 19 16 18   CREATININE 0.89 0.85 0.90  CALCIUM 7.8* 8.1* 8.3*  GFRNONAA >60 >60 >60  GFRAA >60 >60 >60  ANIONGAP 12 12 11      Hematology Recent Labs  Lab 10/18/19 0450 10/19/19 0423 10/20/19 0351  WBC 9.8 8.4 8.9  RBC 3.88 3.86* 3.91  HGB 11.9* 11.8* 11.9*  HCT 36.4 35.5* 36.1  MCV 93.8 92.0 92.3  MCH 30.7 30.6 30.4  MCHC 32.7 33.2 33.0  RDW 12.9 12.9 12.9  PLT 230 206 223    BNP Recent Labs  Lab 10/16/19 1615  BNP 504.9*  DDimer  Recent Labs  Lab 10/16/19 1710  DDIMER 0.61*     Radiology    No results found.  Cardiac Studies   Echo 10/16/19 IMPRESSIONS    1. Left ventricular ejection fraction, by estimation, is 20 to 25%. The  left ventricle has severely decreased function. The left ventricle  demonstrates global hypokinesis. The left ventricular internal cavity size  was mildly dilated. Left ventricular  diastolic parameters are consistent with Grade II diastolic dysfunction  (pseudonormalization). Elevated left ventricular end-diastolic pressure.  2. Right ventricular systolic function is normal. The right ventricular  size is normal. There is normal pulmonary artery systolic pressure. The  estimated right ventricular systolic pressure is 24.5 mmHg.  3. Left atrial size was severely dilated.  4. The mitral valve is normal in structure. Mild mitral valve  regurgitation. No evidence of mitral stenosis.  5. The aortic valve is tricuspid. Aortic valve regurgitation is trivial.  Mild to moderate aortic valve  sclerosis/calcification is present, without  any evidence of aortic stenosis.  6. The inferior vena cava is normal in size with greater than 50%  respiratory variability, suggesting right atrial pressure of 3 mmHg.  7. There is right bowing of the interatrial septum, suggestive of  elevated left atrial pressure. No atrial level shunt detected by color  flow Doppler.   FINDINGS  Left Ventricle: Left ventricular ejection fraction, by estimation, is 20  to 25%. The left ventricle has severely decreased function. The left  ventricle demonstrates global hypokinesis. Definity contrast agent was  given IV to delineate the left  ventricular endocardial borders. The left ventricular internal cavity size  was mildly dilated. There is no left ventricular hypertrophy. Left  ventricular diastolic parameters are consistent with Grade II diastolic  dysfunction (pseudonormalization).  Elevated left ventricular end-diastolic pressure.   Right Ventricle: The right ventricular size is normal. No increase in  right ventricular wall thickness. Right ventricular systolic function is  normal. There is normal pulmonary artery systolic pressure. The tricuspid  regurgitant velocity is 2.32 m/s, and  with an assumed right atrial pressure of 3 mmHg, the estimated right  ventricular systolic pressure is 80.9 mmHg.   Left Atrium: Left atrial size was severely dilated.   Right Atrium: Right atrial size was normal in size.   Pericardium: A small pericardial effusion is present. The pericardial  effusion is anterior to the right ventricle.   Mitral Valve: The mitral valve is normal in structure. Normal mobility of  the mitral valve leaflets. Mild mitral valve regurgitation. No evidence of  mitral valve stenosis.   Tricuspid Valve: The tricuspid valve is normal in structure. Tricuspid  valve regurgitation is mild . No evidence of tricuspid stenosis.   Aortic Valve: The aortic valve is tricuspid. . There is  mild thickening  and mild calcification of the aortic valve. Aortic valve regurgitation is  trivial. Mild to moderate aortic valve sclerosis/calcification is present,  without any evidence of aortic  stenosis. There is mild thickening of the aortic valve. There is mild  calcification of the aortic valve.   Pulmonic Valve: The pulmonic valve was normal in structure. Pulmonic valve  regurgitation is mild. No evidence of pulmonic stenosis.   Aorta: The aortic root is normal in size and structure.   Venous: The inferior vena cava is normal in size with greater than 50%  respiratory variability, suggesting right atrial pressure of 3 mmHg.   IAS/Shunts: There is right bowing of the interatrial septum, suggestive of  elevated left  atrial pressure. No atrial level shunt detected by color  flow Doppler.    Patient Profile     74 y.o. female  with a hx of DM-2, obesity, strong FH of CAD, and normal nuc study 2015, GERD, asthma and fibromyalgia she did have COVID PNA in April 2021now with new cardiomyopathy.   Assessment & Plan    1. 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 Is neg 8088 since admit and now on 40 mg po lasix -though may need another day of IVpt did have COVID PNA in 06/2019 (d dimer 0.61 but neg VQ for PE)  She is neg 4297 since admit wt down to 71.5 Kg. 2. Cardiomyopathy with global hypokinesis and LV dilatation -now on BB after diuresing. Would add Entresto. Also aldactone, farxigaHas had neg nuc in 2015 but strong FH for CAD.  Would plan for cardiac cath but concern for anaphylactics reaction with dye. Discussed in detail by MD. On schedule for Thursday.  Rt and Lt cath.will need  Premeds  Scheduled for 0900 10/21/19 with Dr. Claiborne Billings. 3. HTN elevated on admit now improved. With diuresis and BB. Now on entresto 4. DM-2 per IM 5. Hx of COVID 19 PNA 06/2019 -neg this admit. 6. Asthma no wheezes today  7. Hypokalemia supplementation ordered.        For  questions or updates, please contact [place] Please consult [email or web address] for contact info under        Signed, Cecilie Kicks, NP  10/20/2019, 8:21 AM    Patient seen and examined with Cecilie Kicks NP.  Agree as above, with the following exceptions and changes as noted below. She is breathing better and is no longer on supp O2. No wheezing. Gen: NAD, CV: RRR, no murmurs, Lungs: clear, Abd: soft, Extrem: Warm, well perfused, no edema, Neuro/Psych: alert and oriented x 3, normal mood and affect. All available labs, radiology testing, previous records reviewed. We have discussed R/LHC several times over the last few days. She is inclined to proceed. Given time since last contrast reaction, she feels comfortable moving forward with premedication and contrast. Reviewed with interventional cardiology - recommend premeds but feel would be reasonable to proceed. Plan r/lhc tomorrow.  INFORMED CONSENT: I have reviewed the risks, indications, and alternatives to cardiac catheterization, possible angioplasty, and stenting with the patient. Risks include but are not limited to bleeding, infection, vascular injury, stroke, myocardial infection, arrhythmia, kidney injury, radiation-related injury in the case of prolonged fluoroscopy use, emergency cardiac surgery, and death. The patient understands the risks of serious complication is 1-2 in 1103 with diagnostic cardiac cath and 1-2% or less with angioplasty/stenting.   Elouise Munroe, MD 10/20/19 1:56 PM

## 2019-10-20 NOTE — Care Management Important Message (Signed)
Important Message  Patient Details IM Letter given to the Patient Name: Nicole [name] MRN: [id number] Date of Birth: [birth year]   Medicare Important Message Given:  Yes     Kerin Salen 10/20/2019, 11:32 AM

## 2019-10-20 NOTE — Progress Notes (Signed)
PROGRESS NOTE    Nicole [name]  HFW:[id number] DOB: 09/29/1945 DOA: 10/16/2019 PCP: [name], MD   Chief Complaint  Patient presents with  . Cough   Brief Narrative:  Nicole [name] is Nicole [name] 74 y.o. female with medical history significant of fibromyalgia, GERD, DM. She was recently hospitalized with Covid 19 PNA. She presents to [facility] for evaluation of increasing SOB and DOE along with weakness. She has no prior cardiac hx.  .  ED Course: Afebrile, BP 150/110, HR 108. EKG reveals sinus tachycardia, LAE and possible lateral injury. Lab reveals Covid negative, BNP 504.9, Glucose 151 otherwise normal labs. CXR with cardiomegaly, new sine April '21. 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 - Echo with EF 20-25%, global hypokinesis, grade II diastolic dysfunction, elevated LA pressure (see report) - lasix 40 mg daily PO per cardiology - metoprolol, enteresto, farxiga - Strict I/O, daily weigts - plan for cath 8/12 -> hx anaphylaxis to contrast -> per cards (premedication) - appreciate cardiology recommendations  Acute respiratory distress /shortness of breath/dyspnea on exertion - due to HF exacerbation - CXR with edema vs inflammatory process - V/Q scan without evidence of PE - negative LE dopplers  HTN  -Accelerated hypertension on admission -With current medication blood pressure has improved-stabilized  GERD - continue home meds  DM II - POA no medications. Last A1C 6.5% April '21 SSI, farxiga, diabetic diet  Depression/anxiety  - continue home meds -Continue Cymbalta, as needed Ativan  DVT prophylaxis: lovenox Code Status: full  Family Communication: none at bedside Disposition:   Status is:  Inpatient  Remains inpatient appropriate because:Inpatient level of care appropriate due to severity of illness   Dispo: The patient is from: Home              Anticipated d/c is to: Home              Anticipated d/c date is: 2 days              Patient currently is not medically stable to d/c.   Consultants:   cardiology  Procedures:  Echo IMPRESSIONS    1. Left ventricular ejection fraction, by estimation, is 20 to 25%. The  left ventricle has severely decreased function. The left ventricle  demonstrates global hypokinesis. The left ventricular internal cavity size  was mildly dilated. Left ventricular  diastolic parameters are consistent with Grade II diastolic dysfunction  (pseudonormalization). Elevated left ventricular end-diastolic pressure.  2. Right ventricular systolic function is normal. The right ventricular  size is normal. There is normal pulmonary artery systolic pressure. The  estimated right ventricular systolic pressure is 02.7 mmHg.  3. Left atrial size was severely dilated.  4. The mitral valve is normal in structure. Mild mitral valve  regurgitation. No evidence of mitral stenosis.  5. The aortic valve is tricuspid. Aortic valve regurgitation is trivial.  Mild to moderate aortic valve sclerosis/calcification is present, without  any evidence of aortic stenosis.  6. The inferior vena cava is normal in size with greater than 50%  respiratory variability, suggesting right atrial pressure of 3 mmHg.  7. There is right bowing of the interatrial septum, suggestive of  elevated left atrial pressure. No atrial level shunt  detected by color  flow Doppler.   Antimicrobials: Anti-infectives (From admission, onward)   None     Subjective: C/o baseline SOB No new complaints  Objective: Vitals:   10/20/19 0854 10/20/19 0919 10/20/19 1009 10/20/19 1351  BP:  124/81  119/66  Pulse:  90  84  Resp:    19  Temp:    98.8 F (37.1 C)  TempSrc:    Oral    SpO2: 99%  100% 93%  Weight:      Height:        Intake/Output Summary (Last 24 hours) at 10/20/2019 1808 Last data filed at 10/20/2019 1725 Gross per 24 hour  Intake --  Output 2800 ml  Net -2800 ml   Filed Weights   10/18/19 1005 10/19/19 0500 10/20/19 0500  Weight: 72.2 kg 71.5 kg 71.5 kg    Examination:  General exam: Appears calm and comfortable  Respiratory system: Clear to auscultation. Respiratory effort normal. Cardiovascular system: S1 & S2 heard, RRR.  Gastrointestinal system: Abdomen is nondistended, soft and nontender.  Central nervous system: Alert and oriented. No focal neurological deficits. Extremities: no LEE Skin: No rashes, lesions or ulcers Psychiatry: Judgement and insight appear normal. Mood & affect appropriate.     Data Reviewed: I have personally reviewed following labs and imaging studies  CBC: Recent Labs  Lab 10/16/19 1615 10/18/19 0450 10/19/19 0423 10/20/19 0351  WBC 9.7 9.8 8.4 8.9  NEUTROABS 6.5  --   --   --   HGB 12.5 11.9* 11.8* 11.9*  HCT 38.1 36.4 35.5* 36.1  MCV 92.9 93.8 92.0 92.3  PLT 227 230 206 076    Basic Metabolic Panel: Recent Labs  Lab 10/16/19 1615 10/17/19 0453 10/18/19 0450 10/19/19 0423 10/20/19 0351  NA 145 141 137 143 142  K 3.6 3.8 3.6 3.3* 3.2*  CL 102 101 95* 100 100  CO2 31 29 30 31 31   GLUCOSE 151* 159* 158* 140* 114*  BUN 7* 9 19 16 18   CREATININE 0.69 0.78 0.89 0.85 0.90  CALCIUM 8.8* 8.7* 7.8* 8.1* 8.3*    GFR: Estimated Creatinine Clearance: 52.4 mL/min (by C-G formula based on SCr of 0.9 mg/dL).  Liver Function Tests: No results for input(s): AST, ALT, ALKPHOS, BILITOT, PROT, ALBUMIN in the last 168 hours.  CBG: Recent Labs  Lab 10/19/19 1658 10/19/19 2207 10/20/19 0804 10/20/19 1141 10/20/19 1658  GLUCAP 132* 194* 113* 143* 187*     Recent Results (from the past 240 hour(s))  SARS Coronavirus 2 by RT PCR (hospital order, performed in [facility] hospital lab)  Nasopharyngeal Nasopharyngeal Swab     Status: None   Collection Time: 10/16/19  5:10 PM   Specimen: Nasopharyngeal Swab  Result Value Ref Range Status   SARS Coronavirus 2 NEGATIVE NEGATIVE Final    Comment: (NOTE) SARS-CoV-2 target nucleic acids are NOT DETECTED.  The SARS-CoV-2 RNA is generally detectable in upper and lower respiratory specimens during the acute phase of infection. The lowest concentration of SARS-CoV-2 viral copies this assay can detect is 250 copies / mL. Donterius Filley negative result does not preclude SARS-CoV-2 infection and should not be used as the sole basis for treatment or other patient management decisions.  Yatzary Merriweather negative result may occur with improper specimen collection / handling, submission of specimen other than nasopharyngeal swab, presence of viral mutation(s) within the areas targeted by this assay, and inadequate number of viral copies (<250 copies / mL). Demontay Grantham negative result must be combined with clinical  observations, patient history, and epidemiological information.  Fact Sheet for Patients:   StrictlyIdeas.no  Fact Sheet for Healthcare Providers: BankingDealers.co.za  This test is not yet approved or  cleared by the [place] FDA and has been authorized for detection and/or diagnosis of SARS-CoV-2 by FDA under an Emergency Use Authorization (EUA).  This EUA will remain in effect (meaning this test can be used) for the duration of the COVID-19 declaration under Section 564(b)(1) of the Act, 21 U.S.C. section 360bbb-3(b)(1), unless the authorization is terminated or revoked sooner.  Performed at [facility], [place] [address] [place], [place] [address]          Radiology Studies: No results found.      Scheduled Meds: . aspirin  81 mg Oral Daily  . [START ON 10/21/2019] aspirin  81 mg Oral Pre-Cath  . dapagliflozin propanediol  10 mg Oral Daily  . [START ON 10/21/2019]  diphenhydrAMINE  50 mg Oral Once   Or  . [START ON 10/21/2019] diphenhydrAMINE  50 mg Intravenous Once  . DULoxetine  60 mg Oral Daily  . enoxaparin (LOVENOX) injection  40 mg Subcutaneous Q24H  . fenofibrate  160 mg Oral QHS  . furosemide  40 mg Oral Daily  . insulin aspart  0-15 Units Subcutaneous TID WC  . LORazepam  1 mg Oral BID  . mouth rinse  15 mL Mouth Rinse BID  . [START ON 10/21/2019] methylPREDNISolone (SOLU-MEDROL) injection  40 mg Intravenous Q4H  . metoprolol succinate  25 mg Oral Daily  . mometasone-formoterol  2 puff Inhalation BID  . pantoprazole  40 mg Oral Daily  . potassium chloride  40 mEq Oral BID  . sacubitril-valsartan  1 tablet Oral BID  . sodium chloride flush  3 mL Intravenous Q12H  . sodium chloride flush  3 mL Intravenous Q12H   Continuous Infusions: . sodium chloride    . sodium chloride    . [START ON 10/21/2019] sodium chloride       LOS: 4 days    Time spent: over 30 min    Fayrene Helper, MD [facility]   To contact the attending provider between 7A-7P or the covering provider during after hours 7P-7A, please log into the web site [email or web address] and access using universal [place] password for that web site. If you do not have the password, please call the hospital operator.  10/20/2019, 6:08 PM

## 2019-10-21 ENCOUNTER — Inpatient Hospital Stay (HOSPITAL_COMMUNITY): Payer: Medicare Other

## 2019-10-21 ENCOUNTER — Encounter (HOSPITAL_COMMUNITY)
Admission: EM | Disposition: A | Discharge status: Home / Self Care | Payer: Self-pay | Source: Home / Self Care | Attending: Family Medicine

## 2019-10-21 ENCOUNTER — Telehealth: Payer: Self-pay | Admitting: Cardiology

## 2019-10-21 DIAGNOSIS — R519 Headache, unspecified: Secondary | ICD-10-CM

## 2019-10-21 DIAGNOSIS — E669 Obesity, unspecified: Secondary | ICD-10-CM

## 2019-10-21 DIAGNOSIS — E1169 Type 2 diabetes mellitus with other specified complication: Secondary | ICD-10-CM

## 2019-10-21 DIAGNOSIS — R778 Other specified abnormalities of plasma proteins: Secondary | ICD-10-CM

## 2019-10-21 DIAGNOSIS — R4701 Aphasia: Secondary | ICD-10-CM | POA: Diagnosis present

## 2019-10-21 DIAGNOSIS — I429 Cardiomyopathy, unspecified: Secondary | ICD-10-CM

## 2019-10-21 DIAGNOSIS — I2510 Atherosclerotic heart disease of native coronary artery without angina pectoris: Secondary | ICD-10-CM

## 2019-10-21 HISTORY — PX: RIGHT/LEFT HEART CATH AND CORONARY ANGIOGRAPHY: CATH118266

## 2019-10-21 HISTORY — PX: CORONARY STENT INTERVENTION: CATH118234

## 2019-10-21 LAB — POCT I-STAT EG7
Acid-Base Excess: 3 mmol/L — ABNORMAL HIGH (ref 0.0–2.0)
Acid-Base Excess: 3 mmol/L — ABNORMAL HIGH (ref 0.0–2.0)
Bicarbonate: 28.8 mmol/L — ABNORMAL HIGH (ref 20.0–28.0)
Bicarbonate: 29.2 mmol/L — ABNORMAL HIGH (ref 20.0–28.0)
Calcium, Ion: 1.18 mmol/L (ref 1.15–1.40)
Calcium, Ion: 1.18 mmol/L (ref 1.15–1.40)
HCT: 39 % (ref 36.0–46.0)
HCT: 39 % (ref 36.0–46.0)
Hemoglobin: 13.3 g/dL (ref 12.0–15.0)
Hemoglobin: 13.3 g/dL (ref 12.0–15.0)
O2 Saturation: 74 %
O2 Saturation: 77 %
Potassium: 4.2 mmol/L (ref 3.5–5.1)
Potassium: 4.2 mmol/L (ref 3.5–5.1)
Sodium: 143 mmol/L (ref 135–145)
Sodium: 143 mmol/L (ref 135–145)
TCO2: 30 mmol/L (ref 22–32)
TCO2: 31 mmol/L (ref 22–32)
pCO2, Ven: 48.2 mmHg (ref 44.0–60.0)
pCO2, Ven: 49.2 mmHg (ref 44.0–60.0)
pH, Ven: 7.382 (ref 7.250–7.430)
pH, Ven: 7.385 (ref 7.250–7.430)
pO2, Ven: 41 mmHg (ref 32.0–45.0)
pO2, Ven: 43 mmHg (ref 32.0–45.0)

## 2019-10-21 LAB — BASIC METABOLIC PANEL
Anion gap: 13 (ref 5–15)
BUN: 15 mg/dL (ref 8–23)
CO2: 27 mmol/L (ref 22–32)
Calcium: 9.3 mg/dL (ref 8.9–10.3)
Chloride: 102 mmol/L (ref 98–111)
Creatinine, Ser: 0.86 mg/dL (ref 0.44–1.00)
GFR calc Af Amer: >60 mL/min (ref >60)
GFR calc non Af Amer: >60 mL/min (ref >60)
Glucose, Bld: 186 mg/dL — ABNORMAL HIGH (ref 70–99)
Potassium: 4.5 mmol/L (ref 3.5–5.1)
Sodium: 142 mmol/L (ref 135–145)

## 2019-10-21 LAB — CBC
HCT: 41.9 % (ref 36.0–46.0)
HCT: 38.3 % (ref 36.0–46.0)
Hemoglobin: 13.7 g/dL (ref 12.0–15.0)
Hemoglobin: 12.7 g/dL (ref 12.0–15.0)
MCH: 30.6 pg (ref 26.0–34.0)
MCH: 30.4 pg (ref 26.0–34.0)
MCHC: 32.7 g/dL (ref 30.0–36.0)
MCHC: 33.2 g/dL (ref 30.0–36.0)
MCV: 93.5 fL (ref 80.0–100.0)
MCV: 91.6 fL (ref 80.0–100.0)
Platelets: 239 10*3/uL (ref 150–400)
Platelets: 311 10*3/uL (ref 150–400)
RBC: 4.48 MIL/uL (ref 3.87–5.11)
RBC: 4.18 MIL/uL (ref 3.87–5.11)
RDW: 13 % (ref 11.5–15.5)
RDW: 13 % (ref 11.5–15.5)
WBC: 9.7 10*3/uL (ref 4.0–10.5)
WBC: 11.5 10*3/uL — ABNORMAL HIGH (ref 4.0–10.5)
nRBC: 0 % (ref 0.0–0.2)
nRBC: 0 % (ref 0.0–0.2)

## 2019-10-21 LAB — POCT I-STAT 7, (LYTES, BLD GAS, ICA,H+H)
Acid-Base Excess: 4 mmol/L — ABNORMAL HIGH (ref 0.0–2.0)
Bicarbonate: 29.4 mmol/L — ABNORMAL HIGH (ref 20.0–28.0)
Calcium, Ion: 1.16 mmol/L (ref 1.15–1.40)
HCT: 38 % (ref 36.0–46.0)
Hemoglobin: 12.9 g/dL (ref 12.0–15.0)
O2 Saturation: 99 %
Potassium: 4.2 mmol/L (ref 3.5–5.1)
Sodium: 143 mmol/L (ref 135–145)
TCO2: 31 mmol/L (ref 22–32)
pCO2 arterial: 44.9 mmHg (ref 32.0–48.0)
pH, Arterial: 7.424 (ref 7.350–7.450)
pO2, Arterial: 141 mmHg — ABNORMAL HIGH (ref 83.0–108.0)

## 2019-10-21 LAB — CREATININE, SERUM
Creatinine, Ser: 1.01 mg/dL — ABNORMAL HIGH (ref 0.44–1.00)
GFR calc Af Amer: >60 mL/min (ref >60)
GFR calc non Af Amer: 56 mL/min — ABNORMAL LOW (ref >60)

## 2019-10-21 LAB — PROTIME-INR
INR: 1 (ref 0.8–1.2)
Prothrombin Time: 12.8 seconds (ref 11.4–15.2)

## 2019-10-21 LAB — GLUCOSE, CAPILLARY
Glucose-Capillary: 193 mg/dL — ABNORMAL HIGH (ref 70–99)
Glucose-Capillary: 164 mg/dL — ABNORMAL HIGH (ref 70–99)
Glucose-Capillary: 168 mg/dL — ABNORMAL HIGH (ref 70–99)

## 2019-10-21 LAB — POCT ACTIVATED CLOTTING TIME
Activated Clotting Time: 290 seconds
Activated Clotting Time: 323 seconds

## 2019-10-21 LAB — TSH: TSH: 1.089 u[IU]/mL (ref 0.350–4.500)

## 2019-10-21 SURGERY — RIGHT/LEFT HEART CATH AND CORONARY ANGIOGRAPHY
Anesthesia: LOCAL

## 2019-10-21 MED ORDER — IOHEXOL 350 MG/ML SOLN
75.0000 mL | Freq: Once | INTRAVENOUS | Status: AC | PRN
  Administered 2019-10-21: 75 mL via INTRAVENOUS

## 2019-10-21 MED ORDER — ASPIRIN 81 MG PO CHEW: 81.0000 mg | CHEWABLE_TABLET | Freq: Every day | ORAL | Status: DC

## 2019-10-21 MED ORDER — NITROGLYCERIN 1 MG/10 ML FOR IR/CATH LAB
INTRA_ARTERIAL | Status: AC
  Filled 2019-10-21: qty 10

## 2019-10-21 MED ORDER — HEPARIN (PORCINE) IN NACL 1000-0.9 UT/500ML-% IV SOLN
INTRAVENOUS | Status: AC
  Filled 2019-10-21: qty 500

## 2019-10-21 MED ORDER — LORAZEPAM 2 MG/ML IJ SOLN: 2.0000 mg | Freq: Once | INTRAMUSCULAR | Status: DC

## 2019-10-21 MED ORDER — TICAGRELOR 90 MG PO TABS
90.0000 mg | ORAL_TABLET | Freq: Two times a day (BID) | ORAL | Status: DC
  Administered 2019-10-21 – 2019-10-22 (×2): 90 mg via ORAL
  Filled 2019-10-21 (×2): qty 1

## 2019-10-21 MED ORDER — SODIUM CHLORIDE 0.9 % IV SOLN: 250.0000 mL | INTRAVENOUS | Status: DC | PRN

## 2019-10-21 MED ORDER — HEPARIN SODIUM (PORCINE) 1000 UNIT/ML IJ SOLN
INTRAMUSCULAR | Status: DC | PRN
  Administered 2019-10-21: 5000 [IU] via INTRAVENOUS
  Administered 2019-10-21: 3600 [IU] via INTRAVENOUS
  Administered 2019-10-21: 2000 [IU] via INTRAVENOUS

## 2019-10-21 MED ORDER — LORAZEPAM 2 MG/ML IJ SOLN
INTRAMUSCULAR | Status: AC
  Filled 2019-10-21: qty 1

## 2019-10-21 MED ORDER — SODIUM CHLORIDE 0.9 % IV SOLN: INTRAVENOUS | Status: DC

## 2019-10-21 MED ORDER — VERAPAMIL HCL 2.5 MG/ML IV SOLN
INTRAVENOUS | Status: AC
  Filled 2019-10-21: qty 2

## 2019-10-21 MED ORDER — SODIUM CHLORIDE 0.9% FLUSH: 3.0000 mL | INTRAVENOUS | Status: DC | PRN

## 2019-10-21 MED ORDER — NITROGLYCERIN 1 MG/10 ML FOR IR/CATH LAB
INTRA_ARTERIAL | Status: DC | PRN
  Administered 2019-10-21 (×2): 200 ug via INTRACORONARY

## 2019-10-21 MED ORDER — TICAGRELOR 90 MG PO TABS
ORAL_TABLET | ORAL | Status: DC | PRN
  Administered 2019-10-21: 180 mg via ORAL

## 2019-10-21 MED ORDER — HEPARIN SODIUM (PORCINE) 1000 UNIT/ML IJ SOLN
INTRAMUSCULAR | Status: AC
  Filled 2019-10-21: qty 1

## 2019-10-21 MED ORDER — IOHEXOL 350 MG/ML SOLN
INTRAVENOUS | Status: DC | PRN
  Administered 2019-10-21: 125 mL

## 2019-10-21 MED ORDER — MIDAZOLAM HCL 2 MG/2ML IJ SOLN
INTRAMUSCULAR | Status: DC | PRN
  Administered 2019-10-21: 2 mg via INTRAVENOUS
  Administered 2019-10-21 (×2): 1 mg via INTRAVENOUS

## 2019-10-21 MED ORDER — THIAMINE HCL 100 MG/ML IJ SOLN
500.0000 mg | Freq: Three times a day (TID) | INTRAVENOUS | Status: DC
  Administered 2019-10-21: 500 mg via INTRAVENOUS
  Filled 2019-10-21 (×5): qty 5

## 2019-10-21 MED ORDER — MIDAZOLAM HCL 2 MG/2ML IJ SOLN
INTRAMUSCULAR | Status: AC
  Filled 2019-10-21: qty 2

## 2019-10-21 MED ORDER — ENOXAPARIN SODIUM 40 MG/0.4ML ~~LOC~~ SOLN
40.0000 mg | SUBCUTANEOUS | Status: DC
  Administered 2019-10-22: 40 mg via SUBCUTANEOUS
  Filled 2019-10-21: qty 0.4

## 2019-10-21 MED ORDER — FENTANYL CITRATE (PF) 100 MCG/2ML IJ SOLN
INTRAMUSCULAR | Status: DC | PRN
  Administered 2019-10-21 (×2): 25 ug via INTRAVENOUS

## 2019-10-21 MED ORDER — HYDRALAZINE HCL 20 MG/ML IJ SOLN: 10.0000 mg | INTRAMUSCULAR | Status: AC | PRN

## 2019-10-21 MED ORDER — ONDANSETRON HCL 4 MG/2ML IJ SOLN: 4.0000 mg | Freq: Four times a day (QID) | INTRAMUSCULAR | Status: DC | PRN

## 2019-10-21 MED ORDER — FENTANYL CITRATE (PF) 100 MCG/2ML IJ SOLN
INTRAMUSCULAR | Status: AC
  Filled 2019-10-21: qty 2

## 2019-10-21 MED ORDER — LIDOCAINE HCL (PF) 1 % IJ SOLN
INTRAMUSCULAR | Status: AC
  Filled 2019-10-21: qty 30

## 2019-10-21 MED ORDER — VERAPAMIL HCL 2.5 MG/ML IV SOLN
INTRAVENOUS | Status: DC | PRN
  Administered 2019-10-21: 10 mL via INTRA_ARTERIAL

## 2019-10-21 MED ORDER — LABETALOL HCL 5 MG/ML IV SOLN: 10.0000 mg | INTRAVENOUS | Status: AC | PRN

## 2019-10-21 MED ORDER — SODIUM CHLORIDE 0.9% FLUSH
3.0000 mL | Freq: Two times a day (BID) | INTRAVENOUS | Status: DC
  Administered 2019-10-21 – 2019-10-22 (×2): 3 mL via INTRAVENOUS

## 2019-10-21 MED ORDER — HEPARIN (PORCINE) IN NACL 1000-0.9 UT/500ML-% IV SOLN
INTRAVENOUS | Status: DC | PRN
  Administered 2019-10-21: 500 mL

## 2019-10-21 MED ORDER — LIDOCAINE HCL (PF) 1 % IJ SOLN
INTRAMUSCULAR | Status: DC | PRN
  Administered 2019-10-21 (×2): 2 mL

## 2019-10-21 MED ORDER — SODIUM CHLORIDE 0.9 % IV SOLN: INTRAVENOUS | Status: AC

## 2019-10-21 MED ORDER — TICAGRELOR 90 MG PO TABS
ORAL_TABLET | ORAL | Status: AC
  Filled 2019-10-21: qty 2

## 2019-10-21 MED ORDER — ACETAMINOPHEN 325 MG PO TABS
650.0000 mg | ORAL_TABLET | ORAL | Status: DC | PRN
  Administered 2019-10-21: 650 mg via ORAL
  Filled 2019-10-21: qty 2

## 2019-10-21 SURGICAL SUPPLY — 22 items
BALLN EUPHORA RX 2.0X10 (BALLOONS) ×2
BALLN ~~LOC~~ EMERGE MR 2.5X6 (BALLOONS) ×2
BALLOON EUPHORA RX 2.0X10 (BALLOONS) IMPLANT
BALLOON ~~LOC~~ EMERGE MR 2.5X6 (BALLOONS) IMPLANT
CATH BALLN WEDGE 5F 110CM (CATHETERS) ×1 IMPLANT
CATH INFINITI 5 FR JL3.5 (CATHETERS) ×1 IMPLANT
CATH OPTITORQUE TIG 4.0 5F (CATHETERS) ×1 IMPLANT
CATH VISTA GUIDE 6FR XBLAD3.5 (CATHETERS) ×1 IMPLANT
DEVICE RAD COMP TR BAND LRG (VASCULAR PRODUCTS) ×2 IMPLANT
GLIDESHEATH SLEND SS 6F .021 (SHEATH) ×1 IMPLANT
GUIDEWIRE .025 260CM (WIRE) ×1 IMPLANT
GUIDEWIRE INQWIRE 1.5J.035X260 (WIRE) IMPLANT
INQWIRE 1.5J .035X260CM (WIRE) ×2
KIT ENCORE 26 ADVANTAGE (KITS) ×1 IMPLANT
KIT HEART LEFT (KITS) ×2 IMPLANT
PACK CARDIAC CATHETERIZATION (CUSTOM PROCEDURE TRAY) ×2 IMPLANT
SHEATH GLIDE SLENDER 4/5FR (SHEATH) ×1 IMPLANT
SHEATH PROBE COVER 6X72 (BAG) ×1 IMPLANT
STENT RESOLUTE ONYX 2.25X8 (Permanent Stent) ×1 IMPLANT
TRANSDUCER W/STOPCOCK (MISCELLANEOUS) ×2 IMPLANT
TUBING CIL FLEX 10 FLL-RA (TUBING) ×2 IMPLANT
WIRE COUGAR XT STRL 190CM (WIRE) ×1 IMPLANT

## 2019-10-21 NOTE — Progress Notes (Signed)
The patient tolerated the catheterization procedure and intervention well.  Following the procedure, I tried contacting the patient's daughter multiple times but her phone kept going to voicemail and I did not leave a message. Approximately 1 hour in the holding area she was remarkably stable.  When the nurse went to bring her a [place] sandwich upon her return it appeared that this patient was much more confused.  She was moving all her extremities.  There were no definitive focal findings but she was confused and may have had some possible expressive aphasia.  Dr. Curly Shores of neurology has evaluated the patient.  The patient will be going for CT of her head.  She is stable hemodynamically without chest pain and with stable blood pressure and ECG.   Troy Sine, MD, [facility] 10/21/2019  12:29 PM

## 2019-10-21 NOTE — Progress Notes (Signed)
Sitter assigned for patient on [address]. Patient has been resting quietly, napping. Daughter called and is going to meet her mother upstairs in room.

## 2019-10-21 NOTE — Progress Notes (Signed)
Transport via [facility] to [facility] for heart cath. Pt stable just anxious about procedure.Eulas Post, RN

## 2019-10-21 NOTE — Progress Notes (Signed)
Patient transferred to [facility] before I was able to see her. Discussed with patient placement to notify admission team patient needed to be seen by hospitalist at [facility].

## 2019-10-21 NOTE — Progress Notes (Signed)
Patient trying to get up from end of stretcher. Yelling. Flailing arms.  Noncoherent. Requiring several staff to keep her on the stretcher. Assisted OOB to bathroom and voided. Back to bed w/assistance. One staff staying with patient. Calmed down.

## 2019-10-21 NOTE — Progress Notes (Addendum)
PROGRESS NOTE    Nicole [name]  TJQ:[id number] DOB: 29-Apr-1945 DOA: 10/16/2019 PCP: [name], MD   Chief Complaint  Patient presents with  . Cough   Brief Narrative:  Nicole [name] is a 74 y.o. female with medical history significant of fibromyalgia, GERD, DM. She was recently hospitalized with Covid 19 PNA. She presents to [facility] for evaluation of increasing SOB and DOE along with weakness. She has no prior cardiac hx.  .  ED Course: Afebrile, BP 150/110, HR 108. EKG reveals sinus tachycardia, LAE and possible lateral injury. Lab reveals Covid negative, BNP 504.9, Glucose 151 otherwise normal labs. CXR with cardiomegaly, new sine April '21. 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 - Echo with EF 20-25%, global hypokinesis, grade II diastolic dysfunction, elevated LA pressure (see report) - lasix 40 mg daily PO per cardiology - metoprolol, enteresto, farxiga - Strict I/O, daily weigts - plan for cath 8/12 -> hx anaphylaxis to contrast -> per cards (premedication) - appreciate cardiology recommendations  Acute respiratory distress /shortness of breath/dyspnea on exertion - due to HF exacerbation - CXR with edema vs inflammatory process - V/Q scan without evidence of PE - negative LE dopplers   Acute metabolic encephalopathy/aphasia -Following her cardiac cath patient reportedly was aphasic. -Code stroke called and patient was evaluated by neurology.  Initial CT angiogram of the head and neck negative for any acute abnormalities. -Neurochecks  -Follow-up with neurology for any further recommendations -Symptoms appear to be slowly resolving  HTN  -Accelerated  hypertension on admission -With current medication blood pressure has improved-stabilized  GERD - continue home meds  DM II - POA no medications. Last A1C 6.5% April '21 SSI, farxiga, diabetic diet  Depression/anxiety  -Continue home meds -Continue Cymbalta, as needed Ativan  DVT prophylaxis: lovenox Code Status: Full  Family Communication: Daughter updated at bedside Disposition:   Status is: Inpatient  Remains inpatient appropriate because:Inpatient level of care appropriate due to severity of illness   Dispo: The patient is from: Home              Anticipated d/c is to: Home              Anticipated d/c date is: 2 days              Patient currently is not medically stable to d/c.   Consultants:   cardiology  Procedures:  Echo IMPRESSIONS    1. Left ventricular ejection fraction, by estimation, is 20 to 25%. The  left ventricle has severely decreased function. The left ventricle  demonstrates global hypokinesis. The left ventricular internal cavity size  was mildly dilated. Left ventricular  diastolic parameters are consistent with Grade II diastolic dysfunction  (pseudonormalization). Elevated left ventricular end-diastolic pressure.  2. Right ventricular systolic function is normal. The right ventricular  size is normal. There is normal pulmonary artery systolic pressure. The  estimated right ventricular systolic pressure is 76.2 mmHg.  3. Left atrial size was severely dilated.  4. The mitral valve is normal in structure. Mild mitral valve  regurgitation. No evidence of mitral stenosis.  5. The aortic valve is tricuspid. Aortic valve regurgitation is trivial.  Mild  to moderate aortic valve sclerosis/calcification is present, without  any evidence of aortic stenosis.  6. The inferior vena cava is normal in size with greater than 50%  respiratory variability, suggesting right atrial pressure of 3 mmHg.  7. There is right bowing of the interatrial  septum, suggestive of  elevated left atrial pressure. No atrial level shunt detected by color  flow Doppler.   Antimicrobials: Anti-infectives (From admission, onward)   None     Subjective: Patient was given Versed and fentanyl prior to her cardiac cath.  Following the procedure she was initially noted to be okay, but was noted to have expressive aphasia.  At that time a code stroke was called.  She became very agitated and required Ativan to have CT.  At this time patient appears to be quite back at her baseline, but less confused per her daughter.  Still not oriented to time and complains of a headache which appears to be chronic  Objective: Vitals:   10/21/19 1215 10/21/19 1340 10/21/19 1430 10/21/19 1525  BP: 134/79 (!) 155/48 (!) 136/101 131/70  Pulse: (!) 103 (!) 105 (!) 102 (!) 109  Resp: (!) 25 (!) 21 (!) 26 16  Temp:      TempSrc:      SpO2: 98% 100% 100% 100%  Weight:      Height:        Intake/Output Summary (Last 24 hours) at 10/21/2019 1542 Last data filed at 10/21/2019 0450 Gross per 24 hour  Intake --  Output 950 ml  Net -950 ml   Filed Weights   10/19/19 0500 10/20/19 0500 10/21/19 0500  Weight: 71.5 kg 71.5 kg 70.4 kg    Examination:  General exam: Appears calm and comfortable  Respiratory system: Clear to auscultation. Respiratory effort normal. Cardiovascular system: S1 & S2 heard, RRR.  Gastrointestinal system: Abdomen is nondistended, soft and nontender.  Central nervous system: Alert and oriented. No focal neurological deficits. Extremities: no LEE Skin: No rashes, lesions or ulcers Psychiatry: Alert but appears somewhat confused to date/time.    Data Reviewed: I have personally reviewed following labs and imaging studies  CBC: Recent Labs  Lab 10/16/19 1615 10/18/19 0450 10/19/19 0423 10/20/19 0351 10/21/19 0439  WBC 9.7 9.8 8.4 8.9 9.7  NEUTROABS 6.5  --   --   --   --   HGB 12.5 11.9* 11.8* 11.9* 13.7  HCT 38.1 36.4 35.5* 36.1  41.9  MCV 92.9 93.8 92.0 92.3 93.5  PLT 227 230 206 223 409    Basic Metabolic Panel: Recent Labs  Lab 10/17/19 0453 10/18/19 0450 10/19/19 0423 10/20/19 0351 10/21/19 0439  NA 141 137 143 142 142  K 3.8 3.6 3.3* 3.2* 4.5  CL 101 95* 100 100 102  CO2 29 30 31 31 27   GLUCOSE 159* 158* 140* 114* 186*  BUN 9 19 16 18 15   CREATININE 0.78 0.89 0.85 0.90 0.86  CALCIUM 8.7* 7.8* 8.1* 8.3* 9.3    GFR: Estimated Creatinine Clearance: 54.3 mL/min (by C-G formula based on SCr of 0.86 mg/dL).  Liver Function Tests: No results for input(s): AST, ALT, ALKPHOS, BILITOT, PROT, ALBUMIN in the last 168 hours.  CBG: Recent Labs  Lab 10/20/19 1141 10/20/19 1658 10/20/19 2116 10/21/19 0729 10/21/19 1114  GLUCAP 143* 187* 100* 193* 164*     Recent Results (from the past 240 hour(s))  SARS Coronavirus 2 by RT PCR (hospital order, performed in [facility] hospital lab) Nasopharyngeal Nasopharyngeal Swab  Status: None   Collection Time: 10/16/19  5:10 PM   Specimen: Nasopharyngeal Swab  Result Value Ref Range Status   SARS Coronavirus 2 NEGATIVE NEGATIVE Final    Comment: (NOTE) SARS-CoV-2 target nucleic acids are NOT DETECTED.  The SARS-CoV-2 RNA is generally detectable in upper and lower respiratory specimens during the acute phase of infection. The lowest concentration of SARS-CoV-2 viral copies this assay can detect is 250 copies / mL. A negative result does not preclude SARS-CoV-2 infection and should not be used as the sole basis for treatment or other patient management decisions.  A negative result may occur with improper specimen collection / handling, submission of specimen other than nasopharyngeal swab, presence of viral mutation(s) within the areas targeted by this assay, and inadequate number of viral copies (<250 copies / mL). A negative result must be combined with clinical observations, patient history, and epidemiological information.  Fact Sheet for  Patients:   StrictlyIdeas.no  Fact Sheet for Healthcare Providers: BankingDealers.co.za  This test is not yet approved or  cleared by the [place] FDA and has been authorized for detection and/or diagnosis of SARS-CoV-2 by FDA under an Emergency Use Authorization (EUA).  This EUA will remain in effect (meaning this test can be used) for the duration of the COVID-19 declaration under Section 564(b)(1) of the Act, 21 U.S.C. section 360bbb-3(b)(1), unless the authorization is terminated or revoked sooner.  Performed at [facility], [place] [address], [place], [place] [address]          Radiology Studies: CT Code Stroke CTA Head W/WO contrast  Result Date: 10/21/2019 CLINICAL DATA:  Code stroke follow-up, aphasia EXAM: CT ANGIOGRAPHY HEAD AND NECK TECHNIQUE: Multidetector CT imaging of the head and neck was performed using the standard protocol during bolus administration of intravenous contrast. Multiplanar CT image reconstructions and MIPs were obtained to evaluate the vascular anatomy. Carotid stenosis measurements (when applicable) are obtained utilizing NASCET criteria, using the distal internal carotid diameter as the denominator. CONTRAST:  30mL OMNIPAQUE IOHEXOL 350 MG/ML SOLN COMPARISON:  None. FINDINGS: CTA NECK Aortic arch: Great vessel origins are patent. Right carotid system: Patent. There is mild calcified plaque at the ICA origin causing minimal stenosis. Left carotid system: Patent. There is mild calcified plaque at the ICA origin causing minimal stenosis. Vertebral arteries: Patent and codominant Skeleton: Degenerative changes of the cervical spine. Other neck: No mass or adenopathy. Upper chest: No apical lung mass. Review of the MIP images confirms the above findings CTA HEAD Motion artifact is present particular limiting evaluation of distal branches. Anterior circulation: Intracranial internal carotid  arteries are patent with mild calcified plaque. Anterior and middle cerebral arteries are patent. Posterior circulation: Intracranial vertebral arteries, basilar artery, and posterior cerebral arteries are patent. Venous sinuses: Patent as allowed by contrast bolus timing. Review of the MIP images confirms the above findings IMPRESSION: No large vessel occlusion or hemodynamically significant stenosis. Electronically Signed   By: Macy Mis M.D.   On: 10/21/2019 13:39   CT Code Stroke CTA Neck W/WO contrast  Result Date: 10/21/2019 CLINICAL DATA:  Code stroke follow-up, aphasia EXAM: CT ANGIOGRAPHY HEAD AND NECK TECHNIQUE: Multidetector CT imaging of the head and neck was performed using the standard protocol during bolus administration of intravenous contrast. Multiplanar CT image reconstructions and MIPs were obtained to evaluate the vascular anatomy. Carotid stenosis measurements (when applicable) are obtained utilizing NASCET criteria, using the distal internal carotid diameter as the denominator. CONTRAST:  8mL OMNIPAQUE IOHEXOL 350  MG/ML SOLN COMPARISON:  None. FINDINGS: CTA NECK Aortic arch: Great vessel origins are patent. Right carotid system: Patent. There is mild calcified plaque at the ICA origin causing minimal stenosis. Left carotid system: Patent. There is mild calcified plaque at the ICA origin causing minimal stenosis. Vertebral arteries: Patent and codominant Skeleton: Degenerative changes of the cervical spine. Other neck: No mass or adenopathy. Upper chest: No apical lung mass. Review of the MIP images confirms the above findings CTA HEAD Motion artifact is present particular limiting evaluation of distal branches. Anterior circulation: Intracranial internal carotid arteries are patent with mild calcified plaque. Anterior and middle cerebral arteries are patent. Posterior circulation: Intracranial vertebral arteries, basilar artery, and posterior cerebral arteries are patent. Venous  sinuses: Patent as allowed by contrast bolus timing. Review of the MIP images confirms the above findings IMPRESSION: No large vessel occlusion or hemodynamically significant stenosis. Electronically Signed   By: Macy Mis M.D.   On: 10/21/2019 13:39   CARDIAC CATHETERIZATION  Result Date: 10/21/2019  1st Mrg lesion is 95% stenosed.  Post intervention, there is a 0% residual stenosis.  Prox RCA to Mid RCA lesion is 20% stenosed.  Prox LAD lesion is 20% stenosed.  Mid LAD lesion is 40% stenosed.  A stent was successfully placed.  Coronary obstructive disease with 20% proximal and 40% smooth mid LAD stenoses; 30% proximal circumflex stenosis with 95% near ostial stenosis in a bifurcating OM1 vessel; and 20% mid RCA narrowing. Normal to minimally increased right heart pressures; mean PA pressure 21 mmHg Successful PCI to 95% very proximal circumflex marginal stenosis with insertion of a 2.25 x 8 mm DES stent with the stenosis being reduced to 0%. RECOMMENDATION: Recommend DAPT for 12 months.  Guideline directed medical therapy for the patient's significant cardiomyopathy which is out of proportion to her high-grade circumflex marginal stenosis.  Aggressive lipid-lowering therapy with target LDL less than 70.   CT HEAD CODE STROKE WO CONTRAST  Result Date: 10/21/2019 CLINICAL DATA:  Code stroke.  Neuro deficit EXAM: CT HEAD WITHOUT CONTRAST TECHNIQUE: Contiguous axial images were obtained from the base of the skull through the vertex without intravenous contrast. COMPARISON:  09/20/2014 head CT. FINDINGS: Please note motion artifact limits evaluation. Brain: No acute infarct or intracranial hemorrhage. No mass lesion. No midline shift, ventriculomegaly or extra-axial fluid collection. Vascular: No hyperdense vessel or unexpected calcification. Bilateral skull base atherosclerotic calcifications. Skull: Negative for fracture or focal lesion. Sinuses/Orbits: Normal orbits. Right ethmoid mucosal  thickening. No mastoid effusion. Other: None. ASPECTS [facility] Stroke Program Early CT Score) - Ganglionic level infarction (caudate, lentiform nuclei, internal capsule, insula, M1-M3 cortex): 7 - Supraganglionic infarction (M4-M6 cortex): 3 Total score (0-10 with 10 being normal): 10 IMPRESSION: 1. No acute intracranial process. Motion artifact limits evaluation. 2. ASPECTS is 10 Code stroke imaging results were communicated on 10/21/2019 at 1:00 pm to provider Dr. Curly Sh[place] Via AMION secure text paging. Electronically Signed   By: Primitivo Gauze M.D.   On: 10/21/2019 13:02        Scheduled Meds: . [MAR Hold] aspirin  81 mg Oral Daily  . [MAR Hold] dapagliflozin propanediol  10 mg Oral Daily  . [MAR Hold] DULoxetine  60 mg Oral Daily  . [MAR Hold] enoxaparin (LOVENOX) injection  40 mg Subcutaneous Q24H  . [MAR Hold] fenofibrate  160 mg Oral QHS  . [MAR Hold] furosemide  40 mg Oral Daily  . [MAR Hold] insulin aspart  0-15 Units Subcutaneous TID WC  . LORazepam      .  LORazepam  2 mg Intravenous Once  . [MAR Hold] LORazepam  1 mg Oral BID  . [MAR Hold] mouth rinse  15 mL Mouth Rinse BID  . [MAR Hold] metoprolol succinate  25 mg Oral Daily  . [MAR Hold] mometasone-formoterol  2 puff Inhalation BID  . [MAR Hold] pantoprazole  40 mg Oral Daily  . [MAR Hold] potassium chloride  40 mEq Oral BID  . [MAR Hold] sacubitril-valsartan  1 tablet Oral BID  . [MAR Hold] sodium chloride flush  3 mL Intravenous Q12H  . [MAR Hold] sodium chloride flush  3 mL Intravenous Q12H  . sodium chloride flush  3 mL Intravenous Q12H   Continuous Infusions: . [MAR Hold] sodium chloride    . sodium chloride    . sodium chloride    . sodium chloride    . sodium chloride    . sodium chloride       LOS: 5 days    Time spent: over 30 min    Norval Morton, MD [facility]   To contact the attending provider between 7A-7P or the covering provider during after hours 7P-7A, please log into the web  site [email or web address] and access using universal [place] password for that web site. If you do not have the password, please call the hospital operator.  10/21/2019, 3:42 PM

## 2019-10-21 NOTE — Telephone Encounter (Signed)
Currently admitted.

## 2019-10-21 NOTE — Interval H&P Note (Signed)
Cath Lab Visit (complete for each Cath Lab visit)  Clinical Evaluation Leading to the Procedure:   ACS: No.  Non-ACS:    Anginal Classification: CCS III  Anti-ischemic medical therapy: No Therapy  Non-Invasive Test Results: No non-invasive testing performed  Prior CABG: No previous CABG      History and Physical Interval Note:  10/21/2019 9:11 AM  Nicole [name]  has presented today for surgery, with the diagnosis of cardiomyopathy.  The various methods of treatment have been discussed with the patient and family. After consideration of risks, benefits and other options for treatment, the patient has consented to  Procedure(s): RIGHT/LEFT HEART CATH AND CORONARY ANGIOGRAPHY (N/A) as a surgical intervention.  The patient's history has been reviewed, patient examined, no change in status, stable for surgery.  I have reviewed the patient's chart and labs.  Questions were answered to the patient's satisfaction.     Shelva Majestic

## 2019-10-21 NOTE — Code Documentation (Signed)
Stroke Response Nurse Documentation Code Documentation  Nicole [name] is a 74 y.o. female post-cardiac cath with past medical hx of diabetes, 20-25% EF, asthma and CAD. Code stroke was activated by cath lab team after patient became suddenly disoriented and had troble speaking after surgery. Pt was given 4 mg of Versed and 50 mcg of Fentanyl during the procedure. LKW at 1110 after procedure when patient was alert and oriented with no complaints. At 1200, RN tried giving patient a sandwich when she noted the patient was not speaking correctly. Pt given 10,600 units of Heparin and 180 mg of Brilinta during the procedure. Stroke team at the bedside. Patient to CT with team. NIHSS 5 initially due to expressive aphasia and bilateral arm drifts, see documentation for details and code stroke times. Patient with disoriented and Expressive aphasia  on last exam with NIHSS 2. The following imaging was completed:  CT, CTA head and neck. Patient is not a candidate for tPA due to stroke not suspected and patient is at increase risk of bleeding with hx of Heparin being used in procedure. Bedside handoff with Lonn Georgia, RN.    Kathrin Greathouse  Stroke Response RN

## 2019-10-21 NOTE — Consult Note (Addendum)
Neurology Consultation Reason for Consult: Code stroke for aphasia Referring Physician: Dr. Shelva Majestic  CC: Difficulty speaking  History is obtained from: Nurse at bedside, chart review, Dr. Shelva Majestic  HPI: Nicole [name] is a 74 y.o. female with a past medical history significant for hypertension, diabetes (A1c 6.5%), new diagnosis of combined systolic and diastolic congestive heart failure, coronary artery disease status post stent (today), anxiety on chronic Ativan, alcohol use, chronic opiate use.   She was taken for coronary angiogram today, and tolerated the procedure well, receiving 125 cc of contrast, a little over 10,000 units of heparin, as well as some Versed and fentanyl during the procedure.  Right radial access was used and there was a TR band in place.  Initially she was recovering well, but then when the nurse returned to check on her the patient was agitated, confused, and appeared aphasic, for which a code stroke was activated.  Last known well 11:10 AM, discovery symptoms at noon tPA not given due to recent heparin use Thrombectomy not attempted due to no large vessel occlusion on CTA  ROS: A 14 point ROS was performed and is negative except as noted in the HPI. Unable to obtain due to altered mental status.   Past Medical History:  Diagnosis Date  . Anemia   . Anxiety   . Back pain   . Bloating   . Blurred vision    occasional  . Chest pain   . Colon polyps   . Dizziness   . Endometriosis    history of  . Fatigue   . Fibromyalgia   . Forgetfulness   . GERD with esophagitis    Follows with Dr. Fuller Plan with GI, EGD in the past  . Headache    migraines  . Joint pain   . Leg weakness   . Major depressive disorder   . Mild intermittent asthma, uncomplicated   . Mixed diabetic hyperlipidemia associated with type 2 diabetes mellitus ([place])   . Obesity   . SOB (shortness of breath)   . Type 2 diabetes mellitus without complication, without long-term current  use of insulin ([place])   . Urinary incontinence   . Vaginitis      Family History  Problem Relation Age of Onset  . Hypertension Mother   . Heart failure Mother   . Diabetes Mother   . Hypertension Father   . Heart failure Father   . Diabetes Father   . Heart attack Father   . Stroke Father   . Cancer Brother        lung  . Thyroid disease Brother   . Cancer Brother        throat  . Cancer Paternal Aunt        bone  . Cancer Paternal Uncle        lung  . Hypertension Maternal Grandmother   . Hypertension Maternal Grandfather   . Hypertension Paternal Grandmother   . Hypertension Paternal Grandfather   . Diabetes Daughter   . Colon cancer Neg Hx   . Stomach cancer Neg Hx     Social History:  reports that she has never smoked. She has never used smokeless tobacco. She reports current alcohol use. She reports that she does not use drugs.   Exam: Current vital signs: BP 115/62   Pulse 87   Temp 98.6 F (37 C) (Oral)   Resp 16   Ht 5\' 2"  (1.575 m)   Wt 70.4 kg  SpO2 96%   BMI 28.39 kg/m  Vital signs in last 24 hours: Temp:  [98.5 F (36.9 C)-98.8 F (37.1 C)] 98.6 F (37 C) (08/12 0442) Pulse Rate:  [0-97] 87 (08/12 1110) Resp:  [0-47] 16 (08/12 1110) BP: (109-150)/(62-85) 115/62 (08/12 1110) SpO2:  [0 %-98 %] 96 % (08/12 1110) Weight:  [70.4 kg] 70.4 kg (08/12 0500)   Physical Exam  Constitutional: Appears well-developed and well-nourished.  Psych: Affect appropriate to situation Eyes: No scleral injection HENT: No OP obstrucion MSK: no joint deformities.  Cardiovascular: Normal rate and regular rhythm.  Respiratory: Effort normal, non-labored breathing GI: Soft.  No distension. There is no tenderness.  Skin: WDI  Neuro: Mental Status: On my arrival, the patient would only recognize Dr. Claiborne Billings as her physician and would not recognize me as a physician.  She would follow commands when given by him but not for anyone else.  She was making significant  phonemic, paraphasic and categorical errors (such as calling my pen a chilli).  She occasionally seemed to be responding to internal stimuli.  She had very poor attention, but with coaching could follow some simple commands. Cranial Nerves: II: Visual Fields are full. Pupils are equal, round, and reactive to light.  5 to 2 mm III,IV, VI: EOMI without ptosis or diploplia.  V: Facial sensation is symmetric to temperature VII: Facial movement is symmetric.  VIII: hearing is intact to voice X: Uvula elevates symmetrically XI: Shoulder shrug is symmetric. XII: tongue is midline without atrophy or fasciculations.  Motor: Tone is normal. Bulk is normal. 5/5 strength was present in all four extremities, though initially she was very hesitant to move the right upper extremity given the recent procedure Sensory: She is equally reactive to touch in all her extremities Plantars: Toes are downgoing bilaterally.  Cerebellar: Finger-to-nose was intact bilaterally  NIHSS score: 4 1A: Level of Consciousness - 0 1B: Ask Month and Age - 1 1C: 'Blink Eyes' & 'Squeeze Hands' - 0 2: Test Horizontal Extraocular Movements - 0 3: Test Visual Fields - 0 4: Test Facial Palsy - 0 5A: Test Left Arm Motor Drift - 0 5B: Test Right Arm Motor Drift - 1 (patient anxious about cath site) 6A: Test Left Leg Motor Drift - 0 6B: Test Right Leg Motor Drift - 0 7: Test Limb Ataxia - 0 8: Test Sensation - 0 9: Test Language/Aphasia- 2 -- paraphrasic and phenomic errors 10: Test Dysarthria - 0 11: Test Extinction/Inattention - 0  I have reviewed labs in epic and the results pertinent to this consultation are: Creatinine was 0.86 B12 1,686 4/21  I have reviewed the images obtained: Head CT did not have any acute abnormalities CTA did not reveal any large vessel occlusion, though it was motion limited for distal arteries  Impression: Nicole [name] is a 74 year old woman woman with a past medical history significant for  cardiomyopathy and recent coronary angiography with stent placement.  Her presentation is most consistent with delirium, given her waxing and waning attention, agitation, and response to internal stimuli.  While delirium can present with aphasia, it should be a diagnosis of exclusion, thus further imaging was obtained to exclude an cause on which we could perform an intervention.  Given her agitation, it was extremely difficult to obtain images, requiring greater than 20 minutes of counseling and cajoling in the CT scan room alone.  I expect that her delirium will slowly improve, and is likely multifactorial secondary to her recent procedure, and medication effects  or even withdrawal from her home medications.   Recommendations: - Delirium / Encephalopathy workup: TSH, B1 (ordered) - high dose IV thiamine (500 mg q8 IV x 3 days, ordered, followed by 100 mg q8h IV while hospitalized, discharge on 100 mg PO daily) - If delirium workup negative with no improvement, please re-consult neurology; we will consider routine EEG to rule out seizure, and/or MRI Brain with and without contrast (can obtain without contrast if unsafe due to kidney function) and structural causes of brain injury - Neurology will sign off at this time  Additionally, please follow these delirium precautions:   - try to minimize deliriogenic medications as much as possible (J Am Geriatr Soc. 2012 Apr;60(4):616-31): benzodiazepines, anticholinergics, diphenhydramine, antihistamines, narcotics, Ambien/Lunesta/Sonata etc. - environmental support for delirium: Lights on during the day, patient up and out of bed as much as is feasible, OT/PT, quiet dimly lit room at night, reorient patient often, provide hearing aides and glasses if patient uses them routinely, minimize sleep disruptions as much as possible overnight.  As much as possible, reorient patient, and have them engage patient in activities, e.g. playing cards. TV should be off or on  neutral background music unless patient engaged and watching. Try to keep interactions with the patient calm and quiet.    - Please keep in mind that although delirium is a reversible cause of altered mental status, patients with delirium are prone to unpredictable behavior that make them a potential risk to their own safety as well as safety of others. One can consider low dose seroquel (12.5 to 25 mg nightly PRN) as needed while monitoring QTc to reduce aggressive behavior, uptitrating slowly as needed.  Lesleigh Noe MD-PhD [facility] [phone]  Addended for charge capture

## 2019-10-21 NOTE — Progress Notes (Signed)
Patient is ready for the cath this morning. Bilateral groin prepped and CHG bath completed. Will continue to monitor

## 2019-10-21 NOTE — Progress Notes (Signed)
Patient arrived from cath lab a&o x4. No pain.  Hemodynamically stable. R wrist at level 1 from hematoma in lab. Patient was given diet ginger ale and given instructions for wrist care. 15 minutes later patient was fine. Watching tv and drinking soda. No complaints.  At [facility], I took her a [place] sandwich and another ginger ale. I asked her if she was hungry that I had a [place] sandwich for her and she shook her head yes but then asked what I had to eat. I repeated myself and asked her if she wanted Mayo and mustard. She responded thirteen. I immediately started a neuro check. She did not have any visual, facial palsy, motor or ataxia issues. Mostly moderate aphasia noted. I called for Dr Claiborne Billings and said d/t changes I feel we should call a code stroke.   Neuro and Dr Claiborne Billings at beside.   Pt to CT and very irriate and unwilling to cooperate. I stood with patient throughout CT for her to be willing to be still.   Upon arriving back to unit, patients aphasia and understanding is worse. She is violently pulling at finger with o2 sat prob bc she was convinced it was a straw. She needed to use the restroom so with assistance of 2 staff we led her to the bathroom and she was so upset and confused she would not sit on toilet.   30 minutes later, she did go back to the bathroom and sit on toilet and voided.    Patient has worn herself out and is now resting with a sitter at bedside.

## 2019-10-21 NOTE — Telephone Encounter (Signed)
Patient has TOC appointment with Coletta Memos on 11/09/2019 at 8:15 am per Roby Lofts.

## 2019-10-22 ENCOUNTER — Encounter (HOSPITAL_COMMUNITY): Payer: Self-pay | Admitting: Cardiovascular Disease

## 2019-10-22 DIAGNOSIS — I2510 Atherosclerotic heart disease of native coronary artery without angina pectoris: Secondary | ICD-10-CM

## 2019-10-22 DIAGNOSIS — Z9861 Coronary angioplasty status: Secondary | ICD-10-CM

## 2019-10-22 DIAGNOSIS — I5041 Acute combined systolic (congestive) and diastolic (congestive) heart failure: Secondary | ICD-10-CM

## 2019-10-22 HISTORY — DX: Atherosclerotic heart disease of native coronary artery without angina pectoris: I25.10

## 2019-10-22 LAB — URINALYSIS, ROUTINE W REFLEX MICROSCOPIC
Bacteria, UA: NONE SEEN
Bilirubin Urine: NEGATIVE
Glucose, UA: >=500 mg/dL — AB
Hgb urine dipstick: NEGATIVE
Ketones, ur: NEGATIVE mg/dL
Nitrite: NEGATIVE
Protein, ur: NEGATIVE mg/dL
Specific Gravity, Urine: 1.021 (ref 1.005–1.030)
pH: 6 (ref 5.0–8.0)

## 2019-10-22 LAB — BASIC METABOLIC PANEL
Anion gap: 9 (ref 5–15)
BUN: 15 mg/dL (ref 8–23)
CO2: 26 mmol/L (ref 22–32)
Calcium: 8.9 mg/dL (ref 8.9–10.3)
Chloride: 105 mmol/L (ref 98–111)
Creatinine, Ser: 0.85 mg/dL (ref 0.44–1.00)
GFR calc Af Amer: >60 mL/min (ref >60)
GFR calc non Af Amer: >60 mL/min (ref >60)
Glucose, Bld: 145 mg/dL — ABNORMAL HIGH (ref 70–99)
Potassium: 3.7 mmol/L (ref 3.5–5.1)
Sodium: 140 mmol/L (ref 135–145)

## 2019-10-22 LAB — LIPID PANEL
Cholesterol: 182 mg/dL (ref 0–200)
HDL: 46 mg/dL (ref >40)
LDL Cholesterol: 110 mg/dL — ABNORMAL HIGH (ref 0–99)
Total CHOL/HDL Ratio: 4 RATIO
Triglycerides: 130 mg/dL (ref <150)
VLDL: 26 mg/dL (ref 0–40)

## 2019-10-22 LAB — GLUCOSE, CAPILLARY
Glucose-Capillary: 138 mg/dL — ABNORMAL HIGH (ref 70–99)
Glucose-Capillary: 133 mg/dL — ABNORMAL HIGH (ref 70–99)

## 2019-10-22 LAB — CBC
HCT: 35.5 % — ABNORMAL LOW (ref 36.0–46.0)
Hemoglobin: 11.9 g/dL — ABNORMAL LOW (ref 12.0–15.0)
MCH: 30.6 pg (ref 26.0–34.0)
MCHC: 33.5 g/dL (ref 30.0–36.0)
MCV: 91.3 fL (ref 80.0–100.0)
Platelets: 249 10*3/uL (ref 150–400)
RBC: 3.89 MIL/uL (ref 3.87–5.11)
RDW: 13.2 % (ref 11.5–15.5)
WBC: 10.7 10*3/uL — ABNORMAL HIGH (ref 4.0–10.5)
nRBC: 0 % (ref 0.0–0.2)

## 2019-10-22 MED ORDER — METOPROLOL SUCCINATE ER 25 MG PO TB24: 25.0000 mg | ORAL_TABLET | Freq: Every day | ORAL | 0 refills | Status: DC

## 2019-10-22 MED ORDER — ATORVASTATIN CALCIUM 80 MG PO TABS: 80.0000 mg | ORAL_TABLET | Freq: Every day | ORAL | Status: DC

## 2019-10-22 MED ORDER — ATORVASTATIN CALCIUM 80 MG PO TABS: 80.0000 mg | ORAL_TABLET | Freq: Every day | ORAL | 0 refills | Status: DC

## 2019-10-22 MED ORDER — FUROSEMIDE 40 MG PO TABS: 40.0000 mg | ORAL_TABLET | Freq: Every day | ORAL | 0 refills | Status: DC

## 2019-10-22 MED ORDER — POTASSIUM CHLORIDE CRYS ER 20 MEQ PO TBCR: 40.0000 meq | EXTENDED_RELEASE_TABLET | Freq: Every day | ORAL | 0 refills | Status: DC

## 2019-10-22 MED ORDER — TICAGRELOR 90 MG PO TABS: 90.0000 mg | ORAL_TABLET | Freq: Two times a day (BID) | ORAL | 0 refills | Status: DC

## 2019-10-22 MED ORDER — SACUBITRIL-VALSARTAN 24-26 MG PO TABS: 1.0000 | ORAL_TABLET | Freq: Two times a day (BID) | ORAL | 0 refills | Status: DC

## 2019-10-22 MED ORDER — DAPAGLIFLOZIN PROPANEDIOL 10 MG PO TABS: 10.0000 mg | ORAL_TABLET | Freq: Every day | ORAL | 0 refills | Status: AC

## 2019-10-22 NOTE — Discharge Summary (Signed)
Physician Discharge Summary  Nicole [name] BPZ:[id number] DOB: [birth year] DOA: 10/16/2019  PCP: [name], MD  Admit date: 10/16/2019 Discharge date: 10/22/2019  Admitted From: Home Discharge disposition: Home with DME   Code Status: Full Code  Diet Recommendation: Cardiac/diabetic diet  Discharge Diagnosis:   Principal Problem:   Acute combined systolic and diastolic congestive heart failure (HCC) Active Problems:   CAD S/P percutaneous coronary angioplasty   Depression   Diabetes mellitus type 2 in obese (HCC)   Chronic daily headache   GERD with esophagitis   DOE (dyspnea on exertion)   Elevated troponin   Cardiomyopathy ([place])   Aphasia   History of Present Illness / Brief narrative:  Nicole [name] a 74 y.o.femalewith PMH significant for DM 2, HLD, GERD, fibromyalgia, COVID-19 pneumonia in April 2021.  Patient presented to the ED on 8/7 with complaint of progressively worsening shortness of breath, dyspnea on exertion and generalized weakness.  Patient apparently never felt well after Covid pneumonia. She continued to feel dyspnea on exertion and chronic chest pain. For 2 weeks prior to presentation, her symptoms progressively worsened and hence he presented to the ED. She also reported orthopnea and requiring more pillows than before along with some lower extremity edema. In the ED, patient has slight elevation in troponin. EKG showed sinus tachycardia with left atrial enlargement and possible lateral injury. Chest x-ray showed cardiomegaly. She was admitted to hospitalist service for further evaluation management. Subsequent EKG obtained on 8/9 showed mild ST elevation in V3 to V6 with LVH.  8/7, echocardiogram showed EF of 20 to 25%, global hypokinesis and internal cavity size mildly dilated,, grade 2 diastolic dysfunction 2/42, patient underwent cardiac catheterization that showed first marginal 95% stenosis status post stenting. Nonsignificant stenosis  elsewhere. 8/12, post cardiac cath, patient had acute onset confusion, delirium. CT head was normal. Mental status improved in few hours.  Hospital Course:  Acute combined systolic and diastolic CHF:  -Presented with worsening dyspnea on exertion, orthopnea, pedal edema. -8/7, echocardiogram showed EF of 20 to 25%, global hypokinesis and internal cavity size mildly dilated,, grade 2 diastolic dysfunction -Cardiology consultation was obtained. -She was initially diuresed with IV Lasix and later transitioned to oral Lasix. -Also started on metoprolol succinate and Entresto. -8/12, post cardiac cath, patient had acute onset confusion, delirium. CT head was normal. Mental status improved in few hours. -Per cardiology, the degree of cardiomyopathy was out of proportion to coronary artery disease. It is unclear if COVID-19 infection contributed as well. -Patient will be discharged home today on multiple succinate, Entresto and Lasix. -Patient will continue to monitor her intake output, daily weight at home. -She will follow up with cardiology as an outpatient. Noted a plan to repeat an echocardiogram in 3 months to evaluate for improvement in EF. If remains low, she will need to see EP for ICD consideration.  CAD s/p PCI/DES to OM1 10/21/19:  -Cardiac cath findings as above.  -She has been started on aspirin and Brilinta to continue for 12 months. Also to continue metoprolol succinate and statin.   DM type 2 -A1c 6.5. -A1C 6.5 this admission.  -Patient was not on medicines for diabetes prior to admission.  -She was started on Farxiga because of coexisting CHF.  -Continue the same at home.   Acute delirium Anxiety disorder -8/12, post cardiac cath, patient was delirious. She had received some Versed and fentanyl during the procedure. -Neurology consultation was obtained. -CT scan of head was negative for any acute  intracranial process. CT angio of head and neck did not show any large vessel  occlusion. -Mental status has improved back to baseline this morning. -Patient seems to have anxiety disorder at baseline and takes Ativan 1 mg twice daily scheduled at home. She seems to be getting the same in the hospital. Continue same post discharge.  Stable to discharge home this morning. Wound care:    Subjective:  Seen and examined this morning. Pleasant elderly Caucasian female. Sitting up at the edge of the bed. Taking her breakfast. Daughter at bedside. Not in distress. Mental status back to baseline. Wants to go home.   Discharge Exam:   Vitals:   10/21/19 2036 10/22/19 0009 10/22/19 0658 10/22/19 0809  BP: (!) 120/91 (!) 121/58 (!) 112/47 (!) 133/58  Pulse: (!) 104 88 91 87  Resp: 18 17 16 18   Temp: 99.1 F (37.3 C) 99 F (37.2 C) 98.1 F (36.7 C) 97.9 F (36.6 C)  TempSrc: Oral Oral Oral Oral  SpO2: 98% 96% 94% 90%  Weight:      Height:        Body mass index is 28.39 kg/m.  General exam: Appears calm and comfortable. Not in physical distress Skin: No rashes, lesions or ulcers. HEENT: Atraumatic, normocephalic, supple neck, no obvious bleeding Lungs: Clear to auscultation bilaterally CVS: Regular rate and rhythm, no murmur GI/Abd soft, nontender, nondistended, bowel sound present CNS: Alert, awake, oriented x3 Psychiatry: Mood appropriate Extremities: No edema, no calf tenderness  Follow ups:   Discharge Instructions    Diet - low sodium heart healthy   Complete by: As directed    Diet Carb Modified   Complete by: As directed    Increase activity slowly   Complete by: As directed       Follow-up Information    Deberah Pelton, NP Follow up on 11/09/2019.   Specialty: Cardiology Why: Please arrive 15 minutes early for your 8:15am post-hospital cardiology appointment Contact information: [address] [place] [address] [phone]        [name], MD Follow up.   Specialty: Endocrinology Contact information: [place] [address] [phone]        [place], Peter M, MD .   Specialty: Cardiology Contact information: [address] [place] [address] [phone]               Recommendations for Outpatient Follow-Up:   1. Follow-up with PCP as an outpatient 2. Follow-up with cardiology as an outpatient  Discharge Instructions:  Follow with Primary MD [name], MD in 7 days   Get CBC and BMP checked in next visit within 1 week by PCP or SNF MD ( we routinely change or add medications that can affect your baseline labs and fluid status, therefore we recommend that you get the mentioned basic workup next visit with your PCP, your PCP may decide not to get them or add new tests based on their clinical decision)  On your next visit with your PCP, please Get Medicines reviewed and adjusted.  Please request your PCP  to go over all Hospital Tests and Procedure/Radiological results at the follow up, please get all Hospital records sent to your Prim MD by signing hospital release before you go home.  Activity: As tolerated with Full fall precautions use walker/cane & assistance as needed  For Heart failure patients - Check your Weight same time everyday, if you gain over 2 pounds, or you develop  in leg swelling, experience more shortness of breath or chest pain, call your Primary MD immediately. Follow Cardiac Low Salt Diet and 1.5 lit/day fluid restriction.  If you have smoked or chewed Tobacco in the last 2 yrs please stop smoking, stop any regular Alcohol  and or any Recreational drug use.  If you experience worsening of your admission symptoms, develop shortness of breath, life threatening emergency, suicidal or homicidal thoughts you must seek medical attention immediately by calling 911 or calling your MD immediately  if symptoms less severe.  You Must read complete instructions/literature along with all the possible adverse reactions/side effects  for all the Medicines you take and that have been prescribed to you. Take any new Medicines after you have completely understood and accpet all the possible adverse reactions/side effects.   Do not drive, operate heavy machinery, perform activities at heights, swimming or participation in water activities or provide baby sitting services if your were admitted for syncope or siezures until you have seen by Primary MD or a Neurologist and advised to do so again.  Do not drive when taking Pain medications.  Do not take more than prescribed Pain, Sleep and Anxiety Medications  Wear Seat belts while driving.   Please note You were cared for by a hospitalist during your hospital stay. If you have any questions about your discharge medications or the care you received while you were in the hospital after you are discharged, you can call the unit and asked to speak with the hospitalist on call if the hospitalist that took care of you is not available. Once you are discharged, your primary care physician will handle any further medical issues. Please note that NO REFILLS for any discharge medications will be authorized once you are discharged, as it is imperative that you return to your primary care physician (or establish a relationship with a primary care physician if you do not have one) for your aftercare needs so that they can reassess your need for medications and monitor your lab values.    Allergies as of 10/22/2019      Reactions   Fish-derived Products Anaphylaxis   Shellfish Allergy Shortness Of Breath   In dye for scan.   Aspirin Nausea Only, Other (See Comments)   Pt stated it only gives her problems when she takes the high dose (325 mg)   Ivp Dye [iodinated Diagnostic Agents]    Hx of ? anaphlaxis   Latex Other (See Comments)   Pt says she gets a "stinky infection"      Medication List    STOP taking these medications   benzonatate 200 MG capsule Commonly known as: TESSALON     predniSONE 10 MG tablet Commonly known as: DELTASONE   predniSONE 5 MG (21) Tbpk tablet Commonly known as: STERAPRED UNI-PAK 21 TAB   pregabalin 100 MG capsule Commonly known as: Lyrica     TAKE these medications   Advair Diskus 250-50 MCG/DOSE Aepb Generic drug: Fluticasone-Salmeterol Inhale 1 puff into the lungs 2 (two) times daily.   aspirin 81 MG tablet Take 81 mg by mouth daily.   atorvastatin 80 MG tablet Commonly known as: LIPITOR Take 1 tablet (80 mg total) by mouth at bedtime.   dapagliflozin propanediol 10 MG Tabs tablet Commonly known as: FARXIGA Take 1 tablet (10 mg total) by mouth daily. Start taking on: October 23, 2019   DULoxetine 30 MG capsule Commonly known as: CYMBALTA Take 2 capsules daily   DULoxetine 60 MG  capsule Commonly known as: CYMBALTA Take 60 mg by mouth daily.   ergocalciferol 1.25 MG (50000 UT) capsule Commonly known as: VITAMIN D2 Take 50,000 Units by mouth once a week.   fenofibrate 160 MG tablet Take 160 mg by mouth at bedtime.   fluticasone 50 MCG/ACT nasal spray Commonly known as: FLONASE Place 1 spray into both nostrils daily as needed for allergies or rhinitis.   furosemide 40 MG tablet Commonly known as: LASIX Take 1 tablet (40 mg total) by mouth daily. Start taking on: October 23, 2019   loratadine 10 MG tablet Commonly known as: CLARITIN Take 1 tablet (10 mg total) by mouth daily.   LORazepam 1 MG tablet Commonly known as: ATIVAN Take 1 mg by mouth 2 (two) times daily.   metoprolol succinate 25 MG 24 hr tablet Commonly known as: TOPROL-XL Take 1 tablet (25 mg total) by mouth daily. Start taking on: October 23, 2019   NONFORMULARY OR COMPOUNDED ITEM Estradiol 0.02% vaginal cream prefilled applicators apply one twice weekly   OneTouch Delica Lancets 32D Misc   OneTouch Verio test strip Generic drug: glucose blood   oxyCODONE-acetaminophen 10-325 MG tablet Commonly known as: PERCOCET Take 1 tablet by mouth  2 (two) times daily as needed for pain.   pantoprazole 40 MG tablet Commonly known as: PROTONIX Take 1 tablet (40 mg total) by mouth 2 (two) times daily.   potassium chloride SA 20 MEQ tablet Commonly known as: KLOR-CON Take 2 tablets (40 mEq total) by mouth daily.   ProAir HFA 108 (90 Base) MCG/ACT inhaler Generic drug: albuterol Inhale 2 puffs into the lungs every 4 (four) hours as needed for wheezing or shortness of breath. Uses as rescue inhaler Reported on 06/20/2015 What changed: additional instructions   sacubitril-valsartan 24-26 MG Commonly known as: ENTRESTO Take 1 tablet by mouth 2 (two) times daily.   ticagrelor 90 MG Tabs tablet Commonly known as: BRILINTA Take 1 tablet (90 mg total) by mouth 2 (two) times daily.   VITAMIN C PO Take 1 tablet by mouth daily.            Durable Medical Equipment  (From admission, onward)         Start     Ordered   10/22/19 1039  DME 3-in-1  Once        10/22/19 1042          Time coordinating discharge: 35 minutes  The results of significant diagnostics from this hospitalization (including imaging, microbiology, ancillary and laboratory) are listed below for reference.    Procedures and Diagnostic Studies:   DG Chest 2 View  Result Date: 10/16/2019 CLINICAL DATA:  Cough. Cough for 2 weeks. Patient was diagnosed with Covid-19 in April. EXAM: CHEST - 2 VIEW COMPARISON:  In 06/24/2019 FINDINGS: The heart is enlarged but stable in configuration. There are bilateral pleural effusions. There is mild perihilar increased opacity and interstitial lines suggestive of early edema. Infectious process could have a similar appearance. There is increased bibasilar atelectasis. IMPRESSION: Stable cardiomegaly. Early edema versus inflammatory process. Small bilateral pleural effusions, increased. Electronically Signed   By: Nolon Nations M.D.   On: 10/16/2019 12:09   NM Pulmonary Perfusion  Result Date: 10/17/2019 CLINICAL DATA:   Chest pain EXAM: NUCLEAR MEDICINE PERFUSION LUNG SCAN TECHNIQUE: Perfusion images were obtained in multiple projections after intravenous injection of radiopharmaceutical. Ventilation scans intentionally deferred if perfusion scan and chest x-ray adequate for interpretation during COVID 19 epidemic. RADIOPHARMACEUTICALS:  4.4 mCi Tc-24m  MAA IV COMPARISON:  Chest x-ray 10/16/2019 FINDINGS: No perfusion defects seen to suggest pulmonary embolus. IMPRESSION: No evidence of pulmonary embolus. Electronically Signed   By: Rolm Baptise M.D.   On: 10/17/2019 18:05   ECHOCARDIOGRAM COMPLETE  Result Date: 10/17/2019    ECHOCARDIOGRAM REPORT   Patient Name:   Nicole [name] Date of Exam: 10/17/2019 Medical Rec #:  [id number]       Height:       62.0 in Accession #:    [id number]      Weight:       158.8 lb Date of Birth:  26-Nov-1945        BSA:          1.733 m Patient Age:    15 years        BP:           133/80 mmHg Patient Gender: F               HR:           83 bpm. Exam Location:  Inpatient Procedure: 2D Echo and Intracardiac Opacification Agent Indications:    Cardiomegaly I51.7  History:        Patient has no prior history of Echocardiogram examinations.                 Signs/Symptoms:Dyspnea; Risk Factors:Diabetes.  Sonographer:    Mikki Santee RDCS (AE) Referring Phys: [place]  1. Left ventricular ejection fraction, by estimation, is 20 to 25%. The left ventricle has severely decreased function. The left ventricle demonstrates global hypokinesis. The left ventricular internal cavity size was mildly dilated. Left ventricular diastolic parameters are consistent with Grade II diastolic dysfunction (pseudonormalization). Elevated left ventricular end-diastolic pressure.  2. Right ventricular systolic function is normal. The right ventricular size is normal. There is normal pulmonary artery systolic pressure. The estimated right ventricular systolic pressure is 80.3 mmHg.  3. Left atrial  size was severely dilated.  4. The mitral valve is normal in structure. Mild mitral valve regurgitation. No evidence of mitral stenosis.  5. The aortic valve is tricuspid. Aortic valve regurgitation is trivial. Mild to moderate aortic valve sclerosis/calcification is present, without any evidence of aortic stenosis.  6. The inferior vena cava is normal in size with greater than 50% respiratory variability, suggesting right atrial pressure of 3 mmHg.  7. There is right bowing of the interatrial septum, suggestive of elevated left atrial pressure. No atrial level shunt detected by color flow Doppler. FINDINGS  Left Ventricle: Left ventricular ejection fraction, by estimation, is 20 to 25%. The left ventricle has severely decreased function. The left ventricle demonstrates global hypokinesis. Definity contrast agent was given IV to delineate the left ventricular endocardial borders. The left ventricular internal cavity size was mildly dilated. There is no left ventricular hypertrophy. Left ventricular diastolic parameters are consistent with Grade II diastolic dysfunction (pseudonormalization). Elevated left ventricular end-diastolic pressure. Right Ventricle: The right ventricular size is normal. No increase in right ventricular wall thickness. Right ventricular systolic function is normal. There is normal pulmonary artery systolic pressure. The tricuspid regurgitant velocity is 2.32 m/s, and  with an assumed right atrial pressure of 3 mmHg, the estimated right ventricular systolic pressure is 21.2 mmHg. Left Atrium: Left atrial size was severely dilated. Right Atrium: Right atrial size was normal in size. Pericardium: A small pericardial effusion is present. The pericardial effusion is anterior to the right ventricle. Mitral Valve: The mitral valve is  normal in structure. Normal mobility of the mitral valve leaflets. Mild mitral valve regurgitation. No evidence of mitral valve stenosis. Tricuspid Valve: The tricuspid  valve is normal in structure. Tricuspid valve regurgitation is mild . No evidence of tricuspid stenosis. Aortic Valve: The aortic valve is tricuspid. . There is mild thickening and mild calcification of the aortic valve. Aortic valve regurgitation is trivial. Mild to moderate aortic valve sclerosis/calcification is present, without any evidence of aortic stenosis. There is mild thickening of the aortic valve. There is mild calcification of the aortic valve. Pulmonic Valve: The pulmonic valve was normal in structure. Pulmonic valve regurgitation is mild. No evidence of pulmonic stenosis. Aorta: The aortic root is normal in size and structure. Venous: The inferior vena cava is normal in size with greater than 50% respiratory variability, suggesting right atrial pressure of 3 mmHg. IAS/Shunts: There is right bowing of the interatrial septum, suggestive of elevated left atrial pressure. No atrial level shunt detected by color flow Doppler.  LEFT VENTRICLE PLAX 2D LVIDd:         5.40 cm      Diastology LVIDs:         4.50 cm      LV e' lateral:   5.43 cm/s LV PW:         1.20 cm      LV E/e' lateral: 14.7 LV IVS:        0.90 cm      LV e' medial:    5.27 cm/s LVOT diam:     1.90 cm      LV E/e' medial:  15.2 LV SV:         31 LV SV Index:   18 LVOT Area:     2.84 cm  LV Volumes (MOD) LV vol d, MOD A2C: 183.0 ml LV vol d, MOD A4C: 163.0 ml LV vol s, MOD A2C: 142.0 ml LV vol s, MOD A4C: 138.0 ml LV SV MOD A2C:     41.0 ml LV SV MOD A4C:     163.0 ml LV SV MOD BP:      33.5 ml RIGHT VENTRICLE RV S prime:     8.18 cm/s TAPSE (M-mode): 1.9 cm LEFT ATRIUM              Index       RIGHT ATRIUM           Index LA diam:        4.60 cm  2.65 cm/m  RA Area:     15.10 cm LA Vol (A2C):   105.0 ml 60.58 ml/m RA Volume:   40.20 ml  23.19 ml/m LA Vol (A4C):   80.4 ml  46.39 ml/m LA Biplane Vol: 98.8 ml  57.01 ml/m  AORTIC VALVE LVOT Vmax:   59.40 cm/s LVOT Vmean:  41.800 cm/s LVOT VTI:    0.110 m  AORTA Ao Root diam: 3.10 cm  MITRAL VALVE               TRICUSPID VALVE MV Area (PHT): 4.21 cm    TR Peak grad:   21.5 mmHg MV Decel Time: 180 msec    TR Vmax:        232.00 cm/s MV E velocity: 79.90 cm/s MV A velocity: 61.30 cm/s  SHUNTS MV E/A ratio:  1.30        Systemic VTI:  0.11 m  Systemic Diam: 1.90 cm Fransico Him MD Electronically signed by Fransico Him MD Signature Date/Time: 10/17/2019/10:01:22 AM    Final    VAS [place] LOWER EXTREMITY VENOUS (DVT) (ONLY [facility] & [facility])  Result Date: 10/17/2019  Lower Venous DVTStudy Indications: SOB, and History of Covid-19 infection 4/21, asthma.  Limitations: Movement. Comparison Study: No prior study Performing Technologist: Sharion Dove RVS  Examination Guidelines: A complete evaluation includes B-mode imaging, spectral Doppler, color Doppler, and power Doppler as needed of all accessible portions of each vessel. Bilateral testing is considered an integral part of a complete examination. Limited examinations for reoccurring indications may be performed as noted. The reflux portion of the exam is performed with the patient in reverse Trendelenburg.  +---------+---------------+---------+-----------+----------+--------------+ RIGHT    CompressibilityPhasicitySpontaneityPropertiesThrombus Aging +---------+---------------+---------+-----------+----------+--------------+ CFV      Full           Yes      Yes                                 +---------+---------------+---------+-----------+----------+--------------+ SFJ      Full                                                        +---------+---------------+---------+-----------+----------+--------------+ FV Prox  Full                                                        +---------+---------------+---------+-----------+----------+--------------+ FV Mid   Full                                                        +---------+---------------+---------+-----------+----------+--------------+ FV  DistalFull                                                        +---------+---------------+---------+-----------+----------+--------------+ PFV      Full                                                        +---------+---------------+---------+-----------+----------+--------------+ POP      Full           Yes      Yes                                 +---------+---------------+---------+-----------+----------+--------------+ PTV      Full                                                        +---------+---------------+---------+-----------+----------+--------------+  PERO     Full                                                        +---------+---------------+---------+-----------+----------+--------------+   +---------+---------------+---------+-----------+----------+--------------+ LEFT     CompressibilityPhasicitySpontaneityPropertiesThrombus Aging +---------+---------------+---------+-----------+----------+--------------+ CFV      Full           Yes      Yes                                 +---------+---------------+---------+-----------+----------+--------------+ SFJ      Full                                                        +---------+---------------+---------+-----------+----------+--------------+ FV Prox  Full                                                        +---------+---------------+---------+-----------+----------+--------------+ FV Mid   Full                                                        +---------+---------------+---------+-----------+----------+--------------+ FV DistalFull                                                        +---------+---------------+---------+-----------+----------+--------------+ PFV      Full                                                        +---------+---------------+---------+-----------+----------+--------------+ POP      Full           Yes      Yes                                  +---------+---------------+---------+-----------+----------+--------------+ PTV      Full                                                        +---------+---------------+---------+-----------+----------+--------------+ PERO     Full                                                        +---------+---------------+---------+-----------+----------+--------------+  Summary: BILATERAL: - No evidence of deep vein thrombosis seen in the lower extremities, bilaterally. -   *See table(s) above for measurements and observations. Electronically signed by Harold Barban MD on 10/17/2019 at 4:56:24 PM.    Final      Labs:   Basic Metabolic Panel: Recent Labs  Lab 10/18/19 0450 10/18/19 0450 10/19/19 0423 10/19/19 0423 10/20/19 0102 10/20/19 7253 10/21/19 6644 10/21/19 0347 10/21/19 4259 10/21/19 5638 10/21/19 7564 10/21/19 0942 10/21/19 0947 10/21/19 1835 10/22/19 0351  NA 137   < > 143   < > 142   < > 142  --  143  --  143  --  143  --  140  K 3.6   < > 3.3*   < > 3.2*   < > 4.5   < > 4.2   < > 4.2   < > 4.2  --  3.7  CL 95*  --  100  --  100  --  102  --   --   --   --   --   --   --  105  CO2 30  --  31  --  31  --  27  --   --   --   --   --   --   --  26  GLUCOSE 158*  --  140*  --  114*  --  186*  --   --   --   --   --   --   --  145*  BUN 19  --  16  --  18  --  15  --   --   --   --   --   --   --  15  CREATININE 0.89   < > 0.85  --  0.90  --  0.86  --   --   --   --   --   --  1.01* 0.85  CALCIUM 7.8*  --  8.1*  --  8.3*  --  9.3  --   --   --   --   --   --   --  8.9   < > = values in this interval not displayed.   GFR Estimated Creatinine Clearance: 55 mL/min (by C-G formula based on SCr of 0.85 mg/dL). Liver Function Tests: No results for input(s): AST, ALT, ALKPHOS, BILITOT, PROT, ALBUMIN in the last 168 hours. No results for input(s): LIPASE, AMYLASE in the last 168 hours. No results for input(s): AMMONIA in the last 168  hours. Coagulation profile Recent Labs  Lab 10/16/19 1615 10/21/19 0439  INR 1.0 1.0    CBC: Recent Labs  Lab 10/16/19 1615 10/18/19 0450 10/19/19 0423 10/19/19 0423 10/20/19 0351 10/20/19 0351 10/21/19 0439 10/21/19 0439 10/21/19 0938 10/21/19 0942 10/21/19 0947 10/21/19 1835 10/22/19 0351  WBC 9.7   < > 8.4  --  8.9  --  9.7  --   --   --   --  11.5* 10.7*  NEUTROABS 6.5  --   --   --   --   --   --   --   --   --   --   --   --   HGB 12.5   < > 11.8*   < > 11.9*   < > 13.7   < > 13.3 13.3 12.9 12.7 11.9*  HCT 38.1   < > 35.5*   < >  36.1   < > 41.9   < > 39.0 39.0 38.0 38.3 35.5*  MCV 92.9   < > 92.0  --  92.3  --  93.5  --   --   --   --  91.6 91.3  PLT 227   < > 206  --  223  --  239  --   --   --   --  311 249   < > = values in this interval not displayed.   Cardiac Enzymes: No results for input(s): CKTOTAL, CKMB, CKMBINDEX, TROPONINI in the last 168 hours. BNP: Invalid input(s): POCBNP CBG: Recent Labs  Lab 10/21/19 0729 10/21/19 1114 10/21/19 1735 10/21/19 2141 10/22/19 0811  GLUCAP 193* 164* 168* 138* 133*   D-Dimer No results for input(s): DDIMER in the last 72 hours. Hgb A1c No results for input(s): HGBA1C in the last 72 hours. Lipid Profile Recent Labs    10/22/19 0705  CHOL 182  HDL 46  LDLCALC 110*  TRIG 130  CHOLHDL 4.0   Thyroid function studies Recent Labs    10/21/19 2224  TSH 1.089   Anemia work up No results for input(s): VITAMINB12, FOLATE, FERRITIN, TIBC, IRON, RETICCTPCT in the last 72 hours. Microbiology Recent Results (from the past 240 hour(s))  SARS Coronavirus 2 by RT PCR (hospital order, performed in [facility] hospital lab) Nasopharyngeal Nasopharyngeal Swab     Status: None   Collection Time: 10/16/19  5:10 PM   Specimen: Nasopharyngeal Swab  Result Value Ref Range Status   SARS Coronavirus 2 NEGATIVE NEGATIVE Final    Comment: (NOTE) SARS-CoV-2 target nucleic acids are NOT DETECTED.  The SARS-CoV-2 RNA is  generally detectable in upper and lower respiratory specimens during the acute phase of infection. The lowest concentration of SARS-CoV-2 viral copies this assay can detect is 250 copies / mL. A negative result does not preclude SARS-CoV-2 infection and should not be used as the sole basis for treatment or other patient management decisions.  A negative result may occur with improper specimen collection / handling, submission of specimen other than nasopharyngeal swab, presence of viral mutation(s) within the areas targeted by this assay, and inadequate number of viral copies (<250 copies / mL). A negative result must be combined with clinical observations, patient history, and epidemiological information.  Fact Sheet for Patients:   StrictlyIdeas.no  Fact Sheet for Healthcare Providers: BankingDealers.co.za  This test is not yet approved or  cleared by the [place] FDA and has been authorized for detection and/or diagnosis of SARS-CoV-2 by FDA under an Emergency Use Authorization (EUA).  This EUA will remain in effect (meaning this test can be used) for the duration of the COVID-19 declaration under Section 564(b)(1) of the Act, 21 U.S.C. section 360bbb-3(b)(1), unless the authorization is terminated or revoked sooner.  Performed at [facility], [place] [address] [place], [place] [address]      Signed: Marlowe Aschoff Emori Kamau  [facility] 10/22/2019, 10:42 AM

## 2019-10-22 NOTE — Progress Notes (Signed)
CARDIAC REHAB PHASE I   PRE:  Rate/Rhythm: 80 SR    BP: sitting 110/70    SaO2: 97 RA  MODE:  Ambulation: 370 ft   POST:  Rate/Rhythm: 102 ST    BP: sitting 117/91     SaO2: 93 RA  Pt feeling well. Able to walk hall. Sways somewhat but no LOB. Some SOB/fatigue after walk but did well overall. VSS. Discussed stent, Brilinta, HF booklet, daily wts, low sodium, exercise and CRPII. Pt and daughter receptive. Will refer to [place]. Understands importance of Brilinta.  [place], ACSM 10/22/2019 11:57 AM

## 2019-10-22 NOTE — Care Management (Signed)
10-22-19 1033 Benefits Check submitted for Brilinta. Case Manager will follow for cost. Graves-Bigelow, Ocie Cornfield, RN, BSN Case Manager

## 2019-10-22 NOTE — Telephone Encounter (Signed)
Currently admitted.

## 2019-10-22 NOTE — Progress Notes (Addendum)
Progress Note  Patient Name: Nicole [name] Date of Encounter: 10/22/2019  Primary Cardiologist: Peter Martinique, MD   Subjective   No complaints of chest pain, SOB, LE edema, or palpitations this morning. Discussed post-cath precautions. We discussed her heart disease and heart failure - questions answered. She is asking when she can go home.   Inpatient Medications    Scheduled Meds: . aspirin  81 mg Oral Daily  . dapagliflozin propanediol  10 mg Oral Daily  . DULoxetine  60 mg Oral Daily  . enoxaparin (LOVENOX) injection  40 mg Subcutaneous Q24H  . fenofibrate  160 mg Oral QHS  . furosemide  40 mg Oral Daily  . insulin aspart  0-15 Units Subcutaneous TID WC  . LORazepam  2 mg Intravenous Once  . LORazepam  1 mg Oral BID  . mouth rinse  15 mL Mouth Rinse BID  . metoprolol succinate  25 mg Oral Daily  . mometasone-formoterol  2 puff Inhalation BID  . pantoprazole  40 mg Oral Daily  . potassium chloride  40 mEq Oral BID  . sacubitril-valsartan  1 tablet Oral BID  . sodium chloride flush  3 mL Intravenous Q12H  . sodium chloride flush  3 mL Intravenous Q12H  . sodium chloride flush  3 mL Intravenous Q12H  . ticagrelor  90 mg Oral BID   Continuous Infusions: . sodium chloride    . sodium chloride    . sodium chloride    . thiamine injection 500 mg (10/21/19 2359)   PRN Meds: sodium chloride, sodium chloride, acetaminophen, albuterol, alum & mag hydroxide-simeth, fluticasone, ondansetron (ZOFRAN) IV, oxyCODONE-acetaminophen **AND** oxyCODONE, sodium chloride flush, sodium chloride flush   Vital Signs    Vitals:   10/21/19 1650 10/21/19 1857 10/21/19 2036 10/22/19 0009  BP: 136/90 133/71 (!) 120/91 (!) 121/58  Pulse: (!) 115 (!) 107 (!) 104 88  Resp: 18 16 18 17   Temp: 99.7 F (37.6 C) 99.9 F (37.7 C) 99.1 F (37.3 C) 99 F (37.2 C)  TempSrc: Axillary Oral Oral Oral  SpO2: 96% 95% 98% 96%  Weight:      Height:        Intake/Output Summary (Last 24 hours) at  10/22/2019 6967 Last data filed at 10/22/2019 0117 Gross per 24 hour  Intake 222 ml  Output --  Net 222 ml   Filed Weights   10/19/19 0500 10/20/19 0500 10/21/19 0500  Weight: 71.5 kg 71.5 kg 70.4 kg    Telemetry    Sinus rhythm with occasional PACs - Personally Reviewed  ECG    No new tracings - Personally Reviewed  Physical Exam   GEN: laying in bed sleeping in no acute distress - wakes easily.   Neck: No JVD, no carotid bruits Cardiac: RRR, no murmurs, rubs, or gallops. Right radial cath site with some ecchymosis but no hematoma or bleeding.  Respiratory: Clear to auscultation bilaterally, no wheezes/ rales/ rhonchi GI: NABS, Soft, nontender, non-distended  MS: No edema; No deformity. Neuro:  Nonfocal, moving all extremities spontaneously Psych: Normal affect   Labs    Chemistry Recent Labs  Lab 10/20/19 0351 10/20/19 0351 10/21/19 0439 10/21/19 0938 10/21/19 0942 10/21/19 0947 10/21/19 1835 10/22/19 0351  NA 142   < > 142   < > 143 143  --  140  K 3.2*   < > 4.5   < > 4.2 4.2  --  3.7  CL 100  --  102  --   --   --   --  105  CO2 31  --  27  --   --   --   --  26  GLUCOSE 114*  --  186*  --   --   --   --  145*  BUN 18  --  15  --   --   --   --  15  CREATININE 0.90   < > 0.86  --   --   --  1.01* 0.85  CALCIUM 8.3*  --  9.3  --   --   --   --  8.9  GFRNONAA >60   < > >60  --   --   --  56* >60  GFRAA >60   < > >60  --   --   --  >60 >60  ANIONGAP 11  --  13  --   --   --   --  9   < > = values in this interval not displayed.     Hematology Recent Labs  Lab 10/21/19 0439 10/21/19 0938 10/21/19 0947 10/21/19 1835 10/22/19 0351  WBC 9.7  --   --  11.5* 10.7*  RBC 4.48  --   --  4.18 3.89  HGB 13.7   < > 12.9 12.7 11.9*  HCT 41.9   < > 38.0 38.3 35.5*  MCV 93.5  --   --  91.6 91.3  MCH 30.6  --   --  30.4 30.6  MCHC 32.7  --   --  33.2 33.5  RDW 13.0  --   --  13.0 13.2  PLT 239  --   --  311 249   < > = values in this interval not displayed.     Cardiac EnzymesNo results for input(s): TROPONINI in the last 168 hours. No results for input(s): TROPIPOC in the last 168 hours.   BNP Recent Labs  Lab 10/16/19 1615  BNP 504.9*     DDimer  Recent Labs  Lab 10/16/19 1710  DDIMER 0.61*     Radiology    CT Code Stroke CTA Head W/WO contrast  Result Date: 10/21/2019 CLINICAL DATA:  Code stroke follow-up, aphasia EXAM: CT ANGIOGRAPHY HEAD AND NECK TECHNIQUE: Multidetector CT imaging of the head and neck was performed using the standard protocol during bolus administration of intravenous contrast. Multiplanar CT image reconstructions and MIPs were obtained to evaluate the vascular anatomy. Carotid stenosis measurements (when applicable) are obtained utilizing NASCET criteria, using the distal internal carotid diameter as the denominator. CONTRAST:  65mL OMNIPAQUE IOHEXOL 350 MG/ML SOLN COMPARISON:  None. FINDINGS: CTA NECK Aortic arch: Great vessel origins are patent. Right carotid system: Patent. There is mild calcified plaque at the ICA origin causing minimal stenosis. Left carotid system: Patent. There is mild calcified plaque at the ICA origin causing minimal stenosis. Vertebral arteries: Patent and codominant Skeleton: Degenerative changes of the cervical spine. Other neck: No mass or adenopathy. Upper chest: No apical lung mass. Review of the MIP images confirms the above findings CTA HEAD Motion artifact is present particular limiting evaluation of distal branches. Anterior circulation: Intracranial internal carotid arteries are patent with mild calcified plaque. Anterior and middle cerebral arteries are patent. Posterior circulation: Intracranial vertebral arteries, basilar artery, and posterior cerebral arteries are patent. Venous sinuses: Patent as allowed by contrast bolus timing. Review of the MIP images confirms the above findings IMPRESSION: No large vessel occlusion or hemodynamically significant stenosis. Electronically Signed    By: Addison Lank.D.  On: 10/21/2019 13:39   CT Code Stroke CTA Neck W/WO contrast  Result Date: 10/21/2019 CLINICAL DATA:  Code stroke follow-up, aphasia EXAM: CT ANGIOGRAPHY HEAD AND NECK TECHNIQUE: Multidetector CT imaging of the head and neck was performed using the standard protocol during bolus administration of intravenous contrast. Multiplanar CT image reconstructions and MIPs were obtained to evaluate the vascular anatomy. Carotid stenosis measurements (when applicable) are obtained utilizing NASCET criteria, using the distal internal carotid diameter as the denominator. CONTRAST:  68mL OMNIPAQUE IOHEXOL 350 MG/ML SOLN COMPARISON:  None. FINDINGS: CTA NECK Aortic arch: Great vessel origins are patent. Right carotid system: Patent. There is mild calcified plaque at the ICA origin causing minimal stenosis. Left carotid system: Patent. There is mild calcified plaque at the ICA origin causing minimal stenosis. Vertebral arteries: Patent and codominant Skeleton: Degenerative changes of the cervical spine. Other neck: No mass or adenopathy. Upper chest: No apical lung mass. Review of the MIP images confirms the above findings CTA HEAD Motion artifact is present particular limiting evaluation of distal branches. Anterior circulation: Intracranial internal carotid arteries are patent with mild calcified plaque. Anterior and middle cerebral arteries are patent. Posterior circulation: Intracranial vertebral arteries, basilar artery, and posterior cerebral arteries are patent. Venous sinuses: Patent as allowed by contrast bolus timing. Review of the MIP images confirms the above findings IMPRESSION: No large vessel occlusion or hemodynamically significant stenosis. Electronically Signed   By: Macy Mis M.D.   On: 10/21/2019 13:39   CARDIAC CATHETERIZATION  Result Date: 10/21/2019  1st Mrg lesion is 95% stenosed.  Post intervention, there is a 0% residual stenosis.  Prox RCA to Mid RCA lesion is 20%  stenosed.  Prox LAD lesion is 20% stenosed.  Mid LAD lesion is 40% stenosed.  A stent was successfully placed.  Coronary obstructive disease with 20% proximal and 40% smooth mid LAD stenoses; 30% proximal circumflex stenosis with 95% near ostial stenosis in a bifurcating OM1 vessel; and 20% mid RCA narrowing. Normal to minimally increased right heart pressures; mean PA pressure 21 mmHg Successful PCI to 95% very proximal circumflex marginal stenosis with insertion of a 2.25 x 8 mm DES stent with the stenosis being reduced to 0%. RECOMMENDATION: Recommend DAPT for 12 months.  Guideline directed medical therapy for the patient's significant cardiomyopathy which is out of proportion to her high-grade circumflex marginal stenosis.  Aggressive lipid-lowering therapy with target LDL less than 70.   CT HEAD CODE STROKE WO CONTRAST  Result Date: 10/21/2019 CLINICAL DATA:  Code stroke.  Neuro deficit EXAM: CT HEAD WITHOUT CONTRAST TECHNIQUE: Contiguous axial images were obtained from the base of the skull through the vertex without intravenous contrast. COMPARISON:  09/20/2014 head CT. FINDINGS: Please note motion artifact limits evaluation. Brain: No acute infarct or intracranial hemorrhage. No mass lesion. No midline shift, ventriculomegaly or extra-axial fluid collection. Vascular: No hyperdense vessel or unexpected calcification. Bilateral skull base atherosclerotic calcifications. Skull: Negative for fracture or focal lesion. Sinuses/Orbits: Normal orbits. Right ethmoid mucosal thickening. No mastoid effusion. Other: None. ASPECTS [facility] Stroke Program Early CT Score) - Ganglionic level infarction (caudate, lentiform nuclei, internal capsule, insula, M1-M3 cortex): 7 - Supraganglionic infarction (M4-M6 cortex): 3 Total score (0-10 with 10 being normal): 10 IMPRESSION: 1. No acute intracranial process. Motion artifact limits evaluation. 2. ASPECTS is 10 Code stroke imaging results were communicated on 10/21/2019  at 1:00 pm to provider Dr. Curly [place] Via AMION secure text paging. Electronically Signed   By: Primitivo Gauze M.D.   On:  10/21/2019 13:02    Cardiac Studies   Echocardiogram 10/16/19: 1. Left ventricular ejection fraction, by estimation, is 20 to 25%. The  left ventricle has severely decreased function. The left ventricle  demonstrates global hypokinesis. The left ventricular internal cavity size  was mildly dilated. Left ventricular  diastolic parameters are consistent with Grade II diastolic dysfunction  (pseudonormalization). Elevated left ventricular end-diastolic pressure.  2. Right ventricular systolic function is normal. The right ventricular  size is normal. There is normal pulmonary artery systolic pressure. The  estimated right ventricular systolic pressure is 45.8 mmHg.  3. Left atrial size was severely dilated.  4. The mitral valve is normal in structure. Mild mitral valve  regurgitation. No evidence of mitral stenosis.  5. The aortic valve is tricuspid. Aortic valve regurgitation is trivial.  Mild to moderate aortic valve sclerosis/calcification is present, without  any evidence of aortic stenosis.  6. The inferior vena cava is normal in size with greater than 50%  respiratory variability, suggesting right atrial pressure of 3 mmHg.  7. There is right bowing of the interatrial septum, suggestive of  elevated left atrial pressure. No atrial level shunt detected by color  flow Doppler.   Left heart catheterization 10/21/19:  1st Mrg lesion is 95% stenosed.  Post intervention, there is a 0% residual stenosis.  Prox RCA to Mid RCA lesion is 20% stenosed.  Prox LAD lesion is 20% stenosed.  Mid LAD lesion is 40% stenosed.  A stent was successfully placed.   Coronary obstructive disease with 20% proximal and 40% smooth mid LAD stenoses; 30% proximal circumflex stenosis with 95% near ostial stenosis in a bifurcating OM1 vessel; and 20% mid RCA  narrowing.  Normal to minimally increased right heart pressures; mean PA pressure 21 mmHg  Successful PCI to 95% very proximal circumflex marginal stenosis with insertion of a 2.25 x 8 mm DES stent with the stenosis being reduced to 0%.  RECOMMENDATION: Recommend DAPT for 12 months.  Guideline directed medical therapy for the patient's significant cardiomyopathy which is out of proportion to her high-grade circumflex marginal stenosis.  Aggressive lipid-lowering therapy with target LDL less than 70.   Patient Profile   74 y.o. female with a hx of DM-2, obesity, strong FH of CAD, and normal nuc study 2015, GERD, asthma and fibromyalgia she did have COVID PNA in April 2021now with new cardiomyopathy.  Assessment & Plan    1. Acute combined CHF: echo this admission with EF 20-25% with global hypokinesis, G2DD, elevated LVEDP, severe LAE, and mild MR. Initially diuresed with a couple doses of IV lasix then transitioned to po lasix 40mg  daily. She was started on metoprolol succinate and entresto this admission. She underwent a R/LHC yesterday which revealed normal to minimally elevated right heart pressures with a 95% OM1 lesion managed with PCI/DES, otherwise 20% p-mRCA stenosis, 20% pLAD stenosis, and 40% mLAD stenosis which was medically managed. It was felt that the degree of her cardiomyopathy was out of proportion to her high-grade OM stenosis. Possible COVID 19 infection 06/2019 contributed.  - Continue metoprolol succinate, entresto, and lasix - Continue to monitor strict I&Os and daily weights - Continue to monitor electrolytes and replete as needed to maintain K >4, Mg >2 - scheduled to receive 40 mEq K this morning.  - Anticipate repeat echo in 3 months to evaluate for improvement in EF - if remains low, will need to see EP for ICD consideration.  2. CAD s/p PCI/DES to OM1 10/21/19: patient underwent LHC  to evaluate cardiomyopathy yesterday. Received PCI/DES to OM1 for 95% stenosis  with recommendations for medical management of 20% p-mRCA stenosis, 20%, pLAD stenosis, and 40% mLAD stenosis. She was started on aspirin and brilinta with recommendations to continue x12 months.  - Continue aspirin and brilinta - Will start statin for aggressive risk factor modifications - Continue metoprolol succinate - LDL 110.   3. HTN: BP stable - Managed in the context of #1  4. DM type 2: A1C 6.5 this admission. She was started on farxiga in light of #1 and 2. - Continue management per primary team  5. AMS: patient with some confusion and possible aphasia following cardiac cath yesterday. Code stroke activated and patient was evaluated by neurology. CT Head was negative for acute findings.  - Continue management per primary team and neurology  Outpatient follow-up has been arranged.    For questions or updates, please contact [place] Please consult [email or web address] for contact info under Cardiology/STEMI.      Signed, Nicole Butts, PA-C  10/22/2019, 6:38 AM   [phone]  Patient seen and examined with Roby Lofts PA-C.  Agree as above, with the following exceptions and changes as noted below. She feels back to baseline. Radial site well healing, no hematoma. Gen: NAD, CV: RRR, no murmurs, Lungs: clear, Abd: soft, Extrem: Warm, well perfused, no edema, Neuro/Psych: alert and oriented x 3, normal mood and affect. All available labs, radiology testing, previous records reviewed.   Mixed cardiomyopathy ICM and NICM. I have instructed the patient that dual antiplatelet therapy should be taken for 1 year without interruption.  We have discussed the consequences of interrupted dual antiplatelet therapy and the risk for in-stent thrombosis. Continue HF therapy at current doses, plan for follow up echo in 3 months and monitor for improvement. If no improvement, could consider cardiac MRI to evaluate for prior myocarditis or cardiomyopathic process.  Stable for discharge from  cardiovascular perspective.  Elouise Munroe, MD 10/22/19 10:25 AM

## 2019-10-22 NOTE — TOC Initial Note (Signed)
Transition of Care [facility]) - Initial/Assessment Note    Patient Details  Name: Nicole [name] MRN: [id number] Date of Birth: 1946-01-16  Transition of Care [facility]) CM/SW Contact:    Bethena Roys, RN Phone Number: 10/22/2019, 11:54 AM  Clinical Narrative: Risk for readmission assessment completed. Patient presented for dyspnea on exertion. Prior to arrival patient was from home alone. Per patient she will have support of her daughter when she returns home. Patient declines the 3n1 at this time- states her bathroom is next door to bedroom and feels that she will be ok without it. No home recommendations from Physical and Occupational Therapies. Medications have been sent to [facility]. Case Manager provided patient with the [place] free card and the [place] card. Daughter to call and get the [facility] activated. Daughter to transport patient home via private vehicle.               Expected Discharge Plan: Home/Self Care Barriers to Discharge: No Barriers Identified   Patient Goals and CMS Choice Patient states their goals for this hospitalization and ongoing recovery are:: to return home   Choice offered to / list presented to : NA  Expected Discharge Plan and Services Expected Discharge Plan: Home/Self Care In-house Referral: NA Discharge Planning Services: CM Consult Post Acute Care Choice: NA Living arrangements for the past 2 months: Mobile Home Expected Discharge Date: 10/22/19               DME Arranged: N/A DME Agency:  (Patient declined 3n1-bathroom next door to bedroom.)       HH Arranged: NA   Prior Living Arrangements/Services Living arrangements for the past 2 months: Mobile Home Lives with:: Self (Daughter states she will stay with patient for a little while.) Patient language and need for interpreter reviewed:: Yes Do you feel safe going back to the place where you live?: Yes      Need for Family Participation in Patient Care: Yes  (Comment)     Criminal Activity/Legal Involvement Pertinent to Current Situation/Hospitalization: No - Comment as needed  Activities of Daily Living Home Assistive Devices/Equipment: Eyeglasses ADL Screening (condition at time of admission) Patient's cognitive ability adequate to safely complete daily activities?: Yes Is the patient deaf or have difficulty hearing?: No Does the patient have difficulty seeing, even when wearing glasses/contacts?: No Does the patient have difficulty concentrating, remembering, or making decisions?: No Patient able to express need for assistance with ADLs?: Yes Does the patient have difficulty dressing or bathing?: No Independently performs ADLs?: Yes (appropriate for developmental age) Does the patient have difficulty walking or climbing stairs?: Yes (Pt gets short of breath) Weakness of Legs: Both Weakness of Arms/Hands: None  Permission Sought/Granted Permission sought to share information with : Family Supports, Case Manager    Emotional Assessment Appearance:: Appears stated age Attitude/Demeanor/Rapport: Engaged Affect (typically observed): Appropriate Orientation: : Oriented to Situation, Oriented to  Time, Oriented to Self, Oriented to Place Alcohol / Substance Use: Not Applicable Psych Involvement: No (comment)  Admission diagnosis:  DOE (dyspnea on exertion) [R06.00] Hypoxia [R09.02] Elevated troponin [R77.8] Elevated brain natriuretic peptide (BNP) level [R79.89] Dyspnea, unspecified type [R06.00] Patient Active Problem List   Diagnosis Date Noted  . Acute combined systolic and diastolic congestive heart failure ([place]) 10/22/2019  . CAD S/P percutaneous coronary angioplasty 10/22/2019  . Aphasia 10/21/2019  . Elevated troponin   . Cardiomyopathy ([place])   . DOE (dyspnea on exertion) 10/16/2019  . Mild intermittent asthma with (acute) exacerbation  06/24/2019  . COVID-19 virus infection 06/24/2019  . Acute metabolic encephalopathy  17/51/0258  . Hypokalemia, inadequate intake 06/24/2019  . Hypomagnesemia 06/24/2019  . GERD with esophagitis   . Major depressive disorder   . Type 2 diabetes mellitus without complication, without long-term current use of insulin ([place])   . Mixed diabetic hyperlipidemia associated with type 2 diabetes mellitus ([place])   . Chronic daily headache 12/07/2014  . Memory loss 08/31/2014  . Worsening headaches 08/31/2014  . Atypical chest pain 11/05/2013  . Anemia   . Anxiety   . Depression   . Diabetes mellitus type 2 in obese ([place])   . Urinary incontinence   . Endometriosis   . Asthma   . Fibromyalgia    PCP:  [name], MD Pharmacy:   [facility] [phone] [place], [place] [address] [place] [address] Phone: [phone] Fax: [place], [place] [address] [place] [address] Phone: [phone] Fax: [place], [place] [address] Phone: [phone] Fax: [phone]   Readmission Risk Interventions Readmission Risk Prevention Plan 10/22/2019  Transportation Screening (No Data)  PCP or Specialist Appt within 3-5 Days Complete  HRI or Home Care Consult Complete  Social Work Consult for [place] Planning/Counseling Complete  Palliative Care Screening Not Applicable  Medication Review Press photographer) Complete  Some recent data might be hidden

## 2019-10-22 NOTE — Discharge Instructions (Signed)
PLEASE REMEMBER TO BRING ALL OF YOUR MEDICATIONS TO EACH OF YOUR FOLLOW-UP OFFICE VISITS.  PLEASE ATTEND ALL SCHEDULED FOLLOW-UP APPOINTMENTS.   Activity: Increase activity slowly as tolerated. You may shower, but no soaking baths (or swimming) for 1 week. No driving for 24 hours. No lifting over 5 lbs for 1 week. No sexual activity for 1 week.   You May Return to Work: in 1 week (if applicable)  Wound Care: You may wash cath site gently with soap and water. Keep cath site clean and dry. If you notice pain, swelling, bleeding or pus at your cath site, please call [phone].    Heart Failure Education: 1. Weigh yourself EVERY morning after you go to the bathroom but before you eat or drink anything. Write this number down in a weight log/diary. If you gain 3 pounds overnight or 5 pounds in a week, call the office. 2. Take your medicines as prescribed. If you have concerns about your medications, please call us before you stop taking them.  3. Eat low salt foods--Limit salt (sodium) to 2000 mg per day. This will help prevent your body from holding onto fluid. Read food labels as many processed foods have a lot of sodium, especially canned goods and prepackaged meats. If you would like some assistance choosing low sodium foods, we would be happy to set you up with a nutritionist. 4. Stay as active as you can everyday. Staying active will give you more energy and make your muscles stronger. Start with 5 minutes at a time and work your way up to 30 minutes a day. Break up your activities--do some in the morning and some in the afternoon. Start with 3 days per week and work your way up to 5 days as you can.  If you have chest pain, feel short of breath, dizzy, or lightheaded, STOP. If you don't feel better after a short rest, call 911. If you do feel better, call the office to let us know you have symptoms with exercise. 5. Limit all fluids for the day to less than 2 liters. Fluid includes all drinks,  coffee, juice, ice chips, soup, jello, and all other liquids.

## 2019-10-22 NOTE — Progress Notes (Signed)
Physical Therapy Treatment Patient Details Name: Nicole [name] MRN: [id number] DOB: January 23, 1946 Today's Date: 10/22/2019    History of Present Illness Nicole [name] is a 74 y.o. female with medical history significant of fibromyalgia, GERD, DM.  Covid 19 PNA in 4/2o21, negative this admission.. She presents to [facility] for evaluation of increasing SOB and dyspnea on exertion along with weakness.    PT Comments    Given pt's AMS yesterday, PT reassessed mobility today prior to discharge. Pt's daughter is in room and confirms 24/7hr care for at least a week after discharge. Pt noted to have decreased safety awareness today, not looking into hallway before walking out of room, narrowly missing running into person with cart. Pt able to ambulate with min guard due to mild instability, no overt LoB. With return to room pt looses balance with sitting and short sits on EoB. Pt attempts to laugh it off, however noted need to be more careful daughter in agreement. PT educated on need for movement every hour in order to build up endurance to PLOF. Pt and daughter in agreement.     Follow Up Recommendations  No PT follow up     Equipment Recommendations  None recommended by PT       Precautions / Restrictions Precautions Precautions: None Precaution Comments: monitor sats Restrictions Weight Bearing Restrictions: No    Mobility  Bed Mobility               General bed mobility comments: patient seated in recliner  Transfers Overall transfer level: Needs assistance Equipment used: None Transfers: Sit to/from Stand Sit to Stand: Min guard         General transfer comment: pt with good power up and steadying into standing, however with backing up to bed to sit has LoB causing her to short sit on EoB. Daughter present and noted  Ambulation/Gait Ambulation/Gait assistance: Min guard Gait Distance (Feet): 400 Feet Assistive device: None Gait Pattern/deviations: Step-through  pattern;Drifts right/left Gait velocity: variable   General Gait Details: min guard for safety, pt does not look coming out of room and person with cart has to evade running into her. as pt drifts left/right in hallwasy       Balance Overall balance assessment: Mild deficits observed, not formally tested                                          Cognition Arousal/Alertness: Awake/alert Behavior During Therapy: WFL for tasks assessed/performed Overall Cognitive Status: Impaired/Different from baseline Area of Impairment: Safety/judgement;Awareness;Problem solving                   Current Attention Level: Selective     Safety/Judgement: Decreased awareness of safety;Decreased awareness of deficits Awareness: Emergent Problem Solving: Requires verbal cues;Requires tactile cues General Comments: pt with episode of AMS yesterday following adminstration of contrast for procedure. It is probable that pt is back to prior level of cognition however she has decreased insight into her safety, reporting that had her daughter not been there she was prepared to drive herself home. Pt requires cuing with ambulation to maintain stability          General Comments General comments (skin integrity, edema, etc.): VSS on RA, daughter in room confirms she will be staying with pt 24/7 for at least a week       Pertinent Vitals/Pain Pain  Assessment: No/denies pain           PT Goals (current goals can now be found in the care plan section) Acute Rehab PT Goals Patient Stated Goal: to go home, would like a BSC PT Goal Formulation: With patient Time For Goal Achievement: 11/03/19 Potential to Achieve Goals: Good Progress towards PT goals: Progressing toward goals    Frequency    Min 2X/week      PT Plan Current plan remains appropriate       AM-PAC PT "6 Clicks" Mobility   Outcome Measure  Help needed turning from your back to your side while in a flat bed  without using bedrails?: None Help needed moving from lying on your back to sitting on the side of a flat bed without using bedrails?: None Help needed moving to and from a bed to a chair (including a wheelchair)?: None Help needed standing up from a chair using your arms (e.g., wheelchair or bedside chair)?: None Help needed to walk in hospital room?: A Little Help needed climbing 3-5 steps with a railing? : A Little 6 Click Score: 22    End of Session Equipment Utilized During Treatment: Gait belt Activity Tolerance: Patient tolerated treatment well Patient left: with call bell/phone within reach;in bed;with family/visitor present;with nursing/sitter in room;Other (comment) (working on d/c) Nurse Communication: Mobility status PT Visit Diagnosis: Muscle weakness (generalized) (M62.81);Difficulty in walking, not elsewhere classified (R26.2)     Time: 7169-6789 PT Time Calculation (min) (ACUTE ONLY): 16 min  Charges:  $Gait Training: 8-22 mins                     Nicole [name] Dk PT, DPT Acute Rehabilitation Services Pager [phone] Office [phone]    [place] 10/22/2019, 1:15 PM

## 2019-10-22 NOTE — TOC Benefit Eligibility Note (Signed)
Transition of Care [facility]) Benefit Eligibility Note    Patient Details  Name: Nicole [name] MRN: [id number] Date of Birth: [birth year]   Medication/Dose: Kary Kos 90 MG BID  Covered?: Yes  Tier: 3 Drug  Prescription Coverage Preferred Pharmacy: [facility]  Spoke with Person/Company/Phone Number:: KEN @  [employer]  #  [phone]  Co-Pay: $47.00  Prior Approval: No  Deductible: Unmet (OUT-OF-POCKET:UNMET)  Additional Notes: TICAGRELOR : Crecencio Mc Phone Number: 10/22/2019, 12:13 PM

## 2019-10-25 LAB — VITAMIN B1: Vitamin B1 (Thiamine): 184.6 nmol/L (ref 66.5–200.0)

## 2019-10-25 NOTE — Telephone Encounter (Signed)
TOC call attempt #1  No answer and unable to leave VM

## 2019-10-26 NOTE — Telephone Encounter (Signed)
Unable to reach pt or leave a message  

## 2019-10-27 NOTE — Telephone Encounter (Signed)
Patient contacted regarding discharge from [facility] on 10/22/19.  Patient understands to follow up with provider Coletta Memos NP on 11/09/19 at 8:15 AM at [place]. Patient understands discharge instructions? YES Patient understands medications and regiment? YES Patient understands to bring all medications to this visit? YES

## 2019-10-27 NOTE — Telephone Encounter (Signed)
Left message with pt's daughter to have pt call back ./cy

## 2019-11-02 ENCOUNTER — Ambulatory Visit: Payer: Medicare Other | Admitting: Obstetrics and Gynecology

## 2019-11-08 NOTE — Progress Notes (Signed)
Cardiology Clinic Note   Patient Name: Nicole [name] Date of Encounter: 11/09/2019  Primary Care Provider:  [name], MD Primary Cardiologist:  Peter Martinique, MD  Patient Profile    Nicole [name] 74 year old female presents to the clinic today for follow-up of her acute combined systolic and diastolic CHF.  Past Medical History    Past Medical History:  Diagnosis Date  . Anemia   . Anxiety   . Back pain   . Bloating   . Blurred vision    occasional  . Chest pain   . Colon polyps   . Dizziness   . Endometriosis    history of  . Fatigue   . Fibromyalgia   . Forgetfulness   . GERD with esophagitis    Follows with Dr. Fuller Plan with GI, EGD in the past  . Headache    migraines  . Joint pain   . Leg weakness   . Major depressive disorder   . Mild intermittent asthma, uncomplicated   . Mixed diabetic hyperlipidemia associated with type 2 diabetes mellitus ([place])   . Obesity   . SOB (shortness of breath)   . Type 2 diabetes mellitus without complication, without long-term current use of insulin ([place])   . Urinary incontinence   . Vaginitis    Past Surgical History:  Procedure Laterality Date  . ABDOMINAL HYSTERECTOMY     TAH BSO  . APPENDECTOMY  1999  . CATARACT EXTRACTION     both  . CHOLECYSTECTOMY    . CORONARY STENT INTERVENTION N/A 10/21/2019   Procedure: CORONARY STENT INTERVENTION;  Surgeon: Troy Sine, MD;  Location: [facility];  Service: Cardiovascular;  Laterality: N/A;  . DENTAL SURGERY    . HERNIA REPAIR  2000  . LAPAROSCOPIC ENDOMETRIOSIS FULGURATION    . RIGHT/LEFT HEART CATH AND CORONARY ANGIOGRAPHY N/A 10/21/2019   Procedure: RIGHT/LEFT HEART CATH AND CORONARY ANGIOGRAPHY;  Surgeon: Troy Sine, MD;  Location: [facility];  Service: Cardiovascular;  Laterality: N/A;    Allergies  Allergies  Allergen Reactions  . Fish-Derived Products Anaphylaxis  . Shellfish Allergy Shortness Of Breath    In dye for scan.  .  Aspirin Nausea Only and Other (See Comments)    Pt stated it only gives her problems when she takes the high dose (325 mg)  . Ivp Dye [Iodinated Diagnostic Agents]     Hx of ? anaphlaxis  . Latex Other (See Comments)    Pt says she gets a "stinky infection"    History of Present Illness    Nicole [name] has a PMH of coronary artery disease status post PCA, acute combined systolic and diastolic CHF, diabetes mellitus, depression, GERD, DOE, cardiomyopathy, and aphasia.  She was diagnosed with COVID-19 pneumonia 4/21.  She presented to the emergency department on 10/16/2019 with complaints of progressively worsening shortness of breath, DOE, and generalized weakness.  She indicated that she had not felt well since having COVID-19 pneumonia.  She continued to have dyspnea on exertion and chronic chest discomfort.  She indicated that her symptoms have progressed over the previous 2 weeks directing her to present to the emergency department.  On arrival to the ED her high-sensitivity troponins were slightly elevated.  Her EKG showed sinus tachycardia with left atrial enlargement and possible lateral injury.  Her CXR showed cardiomegaly.  She was also noted to have some ST elevation in V3-V6 and LVH.  Her echocardiogram 8 /7 showed an EF  of 20-25%,Global hypokinesis, G2 DD.  She underwent cardiac catheterization on 10/21/2019 which showed first marginal 95% stenosed with no other significant stenosis.  Post-cath she had acute onset of confusion and delirium.  She underwent a head CT which was normal.  Her mental status returned to normal a few hours post-cath.  She presents to the clinic today for follow-up evaluation and states her breathing is better than it was when she was admitted to the hospital in early August.  She states that she was not sure whether her breathing was related to her asthma or to her heart at that time.  She states that her mentation is better than it previous was as well.  She has been  slowly increasing her physical activity.  She is now considering the COVID-19 vaccination.  She states that she has had pneumonia twice since having COVID-19 and felt she was too sick to previously received the vaccination.  She states that her blood pressure has been fairly well controlled in the low 100s to 115's over 60s.  I will order a BMP today, continue to slowly increase her physical activity, become vaccinated for COVID-19, and have her follow-up in 1 month.  I plan to titrate Entresto at that time.  Today she denies chest pain, increased shortness of breath, lower extremity edema, fatigue, palpitations, melena, hematuria, hemoptysis, diaphoresis, weakness, presyncope, syncope, orthopnea, and PND.     Home Medications    Prior to Admission medications   Medication Sig Start Date End Date Taking? Authorizing Provider  Ascorbic Acid (VITAMIN C PO) Take 1 tablet by mouth daily.    [provider]  aspirin 81 MG tablet Take 81 mg by mouth daily.    [provider]  atorvastatin (LIPITOR) 80 MG tablet Take 1 tablet (80 mg total) by mouth at bedtime. 10/22/19 11/21/19  Terrilee Croak, MD  dapagliflozin propanediol (FARXIGA) 10 MG TABS tablet Take 1 tablet (10 mg total) by mouth daily. 10/23/19 11/22/19  Terrilee Croak, MD  DULoxetine (CYMBALTA) 30 MG capsule Take 2 capsules daily Patient not taking: Reported on 10/16/2019 07/10/16   Cameron Sprang, MD  DULoxetine (CYMBALTA) 60 MG capsule Take 60 mg by mouth daily. 08/10/19   [provider]  ergocalciferol (VITAMIN D2) 50000 UNITS capsule Take 50,000 Units by mouth once a week.      [provider]  fenofibrate 160 MG tablet Take 160 mg by mouth at bedtime.     [provider]  fluticasone (FLONASE) 50 MCG/ACT nasal spray Place 1 spray into both nostrils daily as needed for allergies or rhinitis.  05/28/19   [provider]  Fluticasone-Salmeterol (ADVAIR DISKUS) 250-50 MCG/DOSE AEPB Inhale 1 puff  into the lungs 2 (two) times daily.    [provider]  furosemide (LASIX) 40 MG tablet Take 1 tablet (40 mg total) by mouth daily. 10/23/19 11/22/19  Terrilee Croak, MD  loratadine (CLARITIN) 10 MG tablet Take 1 tablet (10 mg total) by mouth daily. Patient not taking: Reported on 10/16/2019 06/28/19   Jonetta Osgood, MD  LORazepam (ATIVAN) 1 MG tablet Take 1 mg by mouth 2 (two) times daily.     [provider]  metoprolol succinate (TOPROL-XL) 25 MG 24 hr tablet Take 1 tablet (25 mg total) by mouth daily. 10/23/19 11/22/19  Terrilee Croak, MD  NONFORMULARY OR COMPOUNDED ITEM Estradiol 0.02% vaginal cream prefilled applicators apply one twice weekly Patient not taking: Reported on 10/16/2019 05/23/15   Fontaine, Belinda Block, MD  ONETOUCH DELICA LANCETS 26Z MISC  06/03/16   [provider]  [facility] VERIO test strip  06/03/16   [provider]  oxyCODONE-acetaminophen (PERCOCET) 10-325 MG per tablet Take 1 tablet by mouth 2 (two) times daily as needed for pain.     [provider]  pantoprazole (PROTONIX) 40 MG tablet Take 1 tablet (40 mg total) by mouth 2 (two) times daily. 08/14/11   Ladene Artist, MD  potassium chloride SA (KLOR-CON) 20 MEQ tablet Take 2 tablets (40 mEq total) by mouth daily. 10/22/19 11/21/19  Terrilee Croak, MD  PROAIR HFA 108 (90 Base) MCG/ACT inhaler Inhale 2 puffs into the lungs every 4 (four) hours as needed for wheezing or shortness of breath. Uses as rescue inhaler Reported on 06/20/2015 Patient taking differently: Inhale 2 puffs into the lungs every 4 (four) hours as needed for wheezing or shortness of breath. Uses as rescue inhaler Reported on 10/16/2019 06/28/19   Jonetta Osgood, MD  sacubitril-valsartan (ENTRESTO) 24-26 MG Take 1 tablet by mouth 2 (two) times daily. 10/22/19 11/21/19  Terrilee Croak, MD  ticagrelor (BRILINTA) 90 MG TABS tablet Take 1 tablet (90 mg total) by mouth 2 (two) times daily. 10/22/19 11/21/19  Terrilee Croak, MD     Family History    Family History  Problem Relation Age of Onset  . Hypertension Mother   . Heart failure Mother   . Diabetes Mother   . Hypertension Father   . Heart failure Father   . Diabetes Father   . Heart attack Father   . Stroke Father   . Cancer Brother        lung  . Thyroid disease Brother   . Cancer Brother        throat  . Cancer Paternal Aunt        bone  . Cancer Paternal Uncle        lung  . Hypertension Maternal Grandmother   . Hypertension Maternal Grandfather   . Hypertension Paternal Grandmother   . Hypertension Paternal Grandfather   . Diabetes Daughter   . Colon cancer Neg Hx   . Stomach cancer Neg Hx    She indicated that her mother is deceased. She indicated that her father is deceased. She indicated that two of her five brothers are alive. She indicated that the status of her maternal grandmother is unknown. She indicated that the status of her maternal grandfather is unknown. She indicated that the status of her paternal grandmother is unknown. She indicated that the status of her paternal grandfather is unknown. She indicated that only one of her two daughters is alive. She indicated that the status of her paternal aunt is unknown. She indicated that the status of her paternal uncle is unknown. She indicated that the status of her neg hx is unknown.  Social History    Social History   Socioeconomic History  . Marital status: Widowed    Spouse name: Not on file  . Number of children: 1  . Years of education: Not on file  . Highest education level: Not on file  Occupational History  . Occupation: disability  Tobacco Use  . Smoking status: Never Smoker  . Smokeless tobacco: Never Used  Substance and Sexual Activity  . Alcohol use: Yes    Alcohol/week: 0.0 standard drinks    Comment: occasional  . Drug use: No  . Sexual activity: Not Currently    Comment: 1st intercourse 74 yo-Fewer than 5 partners  Other  Topics Concern  . Not on file   Social History Narrative  . Not on file   Social Determinants of Health   Financial Resource Strain:   . Difficulty of Paying Living Expenses: Not on file  Food Insecurity:   . Worried About Charity fundraiser in the Last Year: Not on file  . Ran Out of Food in the Last Year: Not on file  Transportation Needs:   . Lack of Transportation (Medical): Not on file  . Lack of Transportation (Non-Medical): Not on file  Physical Activity:   . Days of Exercise per Week: Not on file  . Minutes of Exercise per Session: Not on file  Stress:   . Feeling of Stress : Not on file  Social Connections:   . Frequency of Communication with Friends and Family: Not on file  . Frequency of Social Gatherings with Friends and Family: Not on file  . Attends Religious Services: Not on file  . Active Member of Clubs or Organizations: Not on file  . Attends Archivist Meetings: Not on file  . Marital Status: Not on file  Intimate Partner Violence:   . Fear of Current or Ex-Partner: Not on file  . Emotionally Abused: Not on file  . Physically Abused: Not on file  . Sexually Abused: Not on file     Review of Systems    General:  No chills, fever, night sweats or weight changes.  Cardiovascular:  No chest pain, dyspnea on exertion, edema, orthopnea, palpitations, paroxysmal nocturnal dyspnea. Dermatological: No rash, lesions/masses Respiratory: No cough, dyspnea Urologic: No hematuria, dysuria Abdominal:   No nausea, vomiting, diarrhea, bright red blood per rectum, melena, or hematemesis Neurologic:  No visual changes, wkns, changes in mental status. All other systems reviewed and are otherwise negative except as noted above.  Physical Exam    VS:  BP 118/68   Pulse 88   Ht 5\' 2"  (1.575 m)   Wt 154 lb 9.6 oz (70.1 kg)   BMI 28.28 kg/m  , BMI Body mass index is 28.28 kg/m. GEN: Well nourished, well developed, in no acute distress. HEENT: normal. Neck: Supple, no JVD, carotid bruits,  or masses. Cardiac: RRR, no murmurs, rubs, or gallops. No clubbing, cyanosis, edema.  Radials/DP/PT 2+ and equal bilaterally.  Respiratory:  Respirations regular and unlabored, clear to auscultation bilaterally. GI: Soft, nontender, nondistended, BS + x 4. MS: no deformity or atrophy. Skin: warm and dry, no rash.  Right radial cardiac cath site clean dry intact no drainage. Neuro:  Strength and sensation are intact. Psych: Normal affect.  Accessory Clinical Findings    Recent Labs: 06/27/2019: ALT 19 06/28/2019: Magnesium 1.8 10/16/2019: B Natriuretic Peptide 504.9 10/21/2019: TSH 1.089 10/22/2019: BUN 15; Creatinine, Ser 0.85; Hemoglobin 11.9; Platelets 249; Potassium 3.7; Sodium 140   Recent Lipid Panel    Component Value Date/Time   CHOL 182 10/22/2019 0705   TRIG 130 10/22/2019 0705   HDL 46 10/22/2019 0705   CHOLHDL 4.0 10/22/2019 0705   VLDL 26 10/22/2019 0705   LDLCALC 110 (H) 10/22/2019 0705    ECG personally reviewed by me today-normal sinus rhythm no ST or T wave deviation 88 bpm- No acute changes  EKG 10/22/2019 Normal sinus rhythm T wave abnormality consider lateral ischemia 81 bpm  Echocardiogram 10/17/2019 IMPRESSIONS    1. Left ventricular ejection fraction, by estimation, is 20 to 25%. The  left ventricle has severely decreased function. The left ventricle  demonstrates global hypokinesis.  The left ventricular internal cavity size  was mildly dilated. Left ventricular  diastolic parameters are consistent with Grade II diastolic dysfunction  (pseudonormalization). Elevated left ventricular end-diastolic pressure.  2. Right ventricular systolic function is normal. The right ventricular  size is normal. There is normal pulmonary artery systolic pressure. The  estimated right ventricular systolic pressure is 32.3 mmHg.  3. Left atrial size was severely dilated.  4. The mitral valve is normal in structure. Mild mitral valve  regurgitation. No evidence of mitral  stenosis.  5. The aortic valve is tricuspid. Aortic valve regurgitation is trivial.  Mild to moderate aortic valve sclerosis/calcification is present, without  any evidence of aortic stenosis.  6. The inferior vena cava is normal in size with greater than 50%  respiratory variability, suggesting right atrial pressure of 3 mmHg.  7. There is right bowing of the interatrial septum, suggestive of  elevated left atrial pressure. No atrial level shunt detected by color  flow Doppler.   Cardiac catheterization 10/21/19   1st Mrg lesion is 95% stenosed.  Post intervention, there is a 0% residual stenosis.  Prox RCA to Mid RCA lesion is 20% stenosed.  Prox LAD lesion is 20% stenosed.  Mid LAD lesion is 40% stenosed.  A stent was successfully placed.   Coronary obstructive disease with 20% proximal and 40% smooth mid LAD stenoses; 30% proximal circumflex stenosis with 95% near ostial stenosis in a bifurcating OM1 vessel; and 20% mid RCA narrowing.  Normal to minimally increased right heart pressures; mean PA pressure 21 mmHg  Successful PCI to 95% very proximal circumflex marginal stenosis with insertion of a 2.25 x 8 mm DES stent with the stenosis being reduced to 0%.  RECOMMENDATION: Recommend DAPT for 12 months.  Guideline directed medical therapy for the patient's significant cardiomyopathy which is out of proportion to her high-grade circumflex marginal stenosis.  Aggressive lipid-lowering therapy with target LDL less than 70.  Diagnostic Dominance: Right  Intervention      Assessment & Plan   1.  Acute combined CHF-somewhat improved activity intolerance and less DOE.  Echocardiogram 10/17/2019 showed an EF of 20-25% with global hypokinesis, G2 DD, elevated LVEDP, severe LAE, and mild MR.  Underwent cardiac catheterization 10/21/2019 which showed 95% OM lesion was treated with PCI/DES x1, 20% medial RCA, 20% proximal LAD had 40% medial LAD.  It was felt that her  cardiomyopathy was out of proportion to her high-grade OM stenosis.  Given her positive COVID-19 infection 4/21 it was felt that it may have contributed. Continue Entresto, metoprolol, furosemide Heart healthy low-sodium diet-salty 6 given Increase physical activity as tolerated Plan to repeat echocardiogram in 3 months to evaluate for improvement in EF with plans to evaluate for ICD if EF remains low.  Will need follow-up with EP. Order BMP  Coronary artery disease-no chest pain today.  Status post cardiac catheterization 10/21/2019.which showed 95% OM lesion was treated with PCI/DES x1, 20% medial RCA, 20% proximal LAD had 40% medial LAD.  Continue aspirin, Brilinta, metoprolol, atorvastatin Heart healthy low-sodium diet-salty 6 given Increase physical activity as tolerated  Essential hypertension-BP today 118/68 today.  Well-controlled at home. Continue metoprolol Heart healthy low-sodium diet-salty 6 given Increase physical activity as tolerated  Type 2 diabetes-A1c 6.5 on recent hospital admission. Continue Farxiga Heart healthy low-sodium carb modified diet Increase physical activity as tolerated Followed by PCP  Altered mental status-alert, oriented, no acute distress, and at baseline today.  Was noted to have confusion, and possible aphasia following  cardiac catheterization.  Underwent head CT which was negative for acute findings. Follows with neurology  Disposition: Follow-up with Dr. Martinique or myself in 1 month.   Jossie Ng. Verdine Grenfell NP-C    11/09/2019, 8:39 AM [place] [address] Office [phone] Fax [phone]  Notice: This dictation was prepared with Dragon dictation along with smaller phrase technology. Any transcriptional errors that result from this process are unintentional and may not be corrected upon review.

## 2019-11-09 ENCOUNTER — Ambulatory Visit: Payer: Medicare Other | Admitting: General Practice

## 2019-11-09 ENCOUNTER — Encounter: Payer: Self-pay | Admitting: General Practice

## 2019-11-09 ENCOUNTER — Other Ambulatory Visit: Payer: Self-pay

## 2019-11-09 VITALS — BP 118/68 | HR 88 | Ht 62.0 in | Wt 154.6 lb

## 2019-11-09 DIAGNOSIS — I2510 Atherosclerotic heart disease of native coronary artery without angina pectoris: Secondary | ICD-10-CM

## 2019-11-09 DIAGNOSIS — I10 Essential (primary) hypertension: Secondary | ICD-10-CM | POA: Diagnosis not present

## 2019-11-09 DIAGNOSIS — E119 Type 2 diabetes mellitus without complications: Secondary | ICD-10-CM

## 2019-11-09 DIAGNOSIS — I5041 Acute combined systolic (congestive) and diastolic (congestive) heart failure: Secondary | ICD-10-CM

## 2019-11-09 DIAGNOSIS — R404 Transient alteration of awareness: Secondary | ICD-10-CM

## 2019-11-09 LAB — BASIC METABOLIC PANEL
BUN/Creatinine Ratio: 20 (ref 12–28)
BUN: 22 mg/dL (ref 8–27)
CO2: 23 mmol/L (ref 20–29)
Calcium: 8.8 mg/dL (ref 8.7–10.3)
Chloride: 102 mmol/L (ref 96–106)
Creatinine, Ser: 1.09 mg/dL — ABNORMAL HIGH (ref 0.57–1.00)
GFR calc Af Amer: 59 mL/min/{1.73_m2} — ABNORMAL LOW (ref >59)
GFR calc non Af Amer: 51 mL/min/{1.73_m2} — ABNORMAL LOW (ref >59)
Glucose: 136 mg/dL — ABNORMAL HIGH (ref 65–99)
Potassium: 4.9 mmol/L (ref 3.5–5.2)
Sodium: 140 mmol/L (ref 134–144)

## 2019-11-09 NOTE — Patient Instructions (Signed)
Medication Instructions:  Continue current medications  *If you need a refill on your cardiac medications before your next appointment, please call your pharmacy*   Lab Work: BMP Today  If you have labs (blood work) drawn today and your tests are completely normal, you will receive your results only by: Marland Kitchen MyChart Message (if you have MyChart) OR . A paper copy in the mail If you have any lab test that is abnormal or we need to change your treatment, we will call you to review the results.   Testing/Procedures: None Ordered   Follow-Up: At [facility], you and your health needs are our priority.  As part of our continuing mission to provide you with exceptional heart care, we have created designated Provider Care Teams.  These Care Teams include your primary Cardiologist (physician) and Advanced Practice Providers (APPs -  Physician Assistants and Nurse Practitioners) who all work together to provide you with the care you need, when you need it.  We recommend signing up for the patient portal called "MyChart".  Sign up information is provided on this After Visit Summary.  MyChart is used to connect with patients for Virtual Visits (Telemedicine).  Patients are able to view lab/test results, encounter notes, upcoming appointments, etc.  Non-urgent messages can be sent to your provider as well.   To learn more about what you can do with MyChart, go to [email or web address].    Your next appointment:   1 month(s)  The format for your next appointment:   In Person  Provider:   Dr Martinique or Coletta Memos

## 2019-11-19 ENCOUNTER — Telehealth: Payer: Self-pay | Admitting: Cardiology

## 2019-11-19 ENCOUNTER — Other Ambulatory Visit: Payer: Self-pay

## 2019-11-19 MED ORDER — POTASSIUM CHLORIDE CRYS ER 20 MEQ PO TBCR: 40.0000 meq | EXTENDED_RELEASE_TABLET | Freq: Every day | ORAL | 11 refills | Status: DC

## 2019-11-19 MED ORDER — SACUBITRIL-VALSARTAN 24-26 MG PO TABS: 1.0000 | ORAL_TABLET | Freq: Two times a day (BID) | ORAL | 11 refills | Status: DC

## 2019-11-19 MED ORDER — METOPROLOL SUCCINATE ER 25 MG PO TB24: 25.0000 mg | ORAL_TABLET | Freq: Every day | ORAL | 11 refills | Status: DC

## 2019-11-19 MED ORDER — TICAGRELOR 90 MG PO TABS: 90.0000 mg | ORAL_TABLET | Freq: Two times a day (BID) | ORAL | 11 refills | Status: AC

## 2019-11-19 MED ORDER — ATORVASTATIN CALCIUM 80 MG PO TABS: 80.0000 mg | ORAL_TABLET | Freq: Every day | ORAL | 11 refills | Status: DC

## 2019-11-19 NOTE — Telephone Encounter (Signed)
*  STAT* If patient is at the pharmacy, call can be transferred to refill team.   1. Which medications need to be refilled? (please list name of each medication and dose if known)  metoprolol succinate (TOPROL-XL) 25 MG 24 hr tablet atorvastatin (LIPITOR) 80 MG tablet potassium chloride SA (KLOR-CON) 20 MEQ tablet sacubitril-valsartan (ENTRESTO) 24-26 MG ticagrelor (BRILINTA) 90 MG TABS tablet  2. Which pharmacy/location (including street and city if local pharmacy) is medication to be sent to? [facility] [phone] - [place], [place]  3. Do they need a 30 day or 90 day supply? 90 day

## 2019-12-06 ENCOUNTER — Ambulatory Visit: Payer: Medicare Other | Admitting: Obstetrics and Gynecology

## 2019-12-08 NOTE — Progress Notes (Deleted)
Cardiology Clinic Note   Patient Name: Nicole [name] Date of Encounter: 12/08/2019  Primary Care Provider:  [name], MD Primary Cardiologist:  Peter Martinique, MD  Patient Profile    Nicole [name] 74 year old female presents to the clinic today for follow-up of her acute combined systolic and diastolic CHF.  Past Medical History    Past Medical History:  Diagnosis Date  . Anemia   . Anxiety   . Back pain   . Bloating   . Blurred vision    occasional  . Chest pain   . Colon polyps   . Dizziness   . Endometriosis    history of  . Fatigue   . Fibromyalgia   . Forgetfulness   . GERD with esophagitis    Follows with Dr. Fuller Plan with GI, EGD in the past  . Headache    migraines  . Joint pain   . Leg weakness   . Major depressive disorder   . Mild intermittent asthma, uncomplicated   . Mixed diabetic hyperlipidemia associated with type 2 diabetes mellitus (Nicole [name])   . Obesity   . SOB (shortness of breath)   . Type 2 diabetes mellitus without complication, without long-term current use of insulin (Nicole [name])   . Urinary incontinence   . Vaginitis    Past Surgical History:  Procedure Laterality Date  . ABDOMINAL HYSTERECTOMY     TAH BSO  . APPENDECTOMY  1999  . CATARACT EXTRACTION     both  . CHOLECYSTECTOMY    . CORONARY STENT INTERVENTION N/A 10/21/2019   Procedure: CORONARY STENT INTERVENTION;  Surgeon: Troy Sine, MD;  Location: [facility];  Service: Cardiovascular;  Laterality: N/A;  . DENTAL SURGERY    . HERNIA REPAIR  2000  . LAPAROSCOPIC ENDOMETRIOSIS FULGURATION    . RIGHT/LEFT HEART CATH AND CORONARY ANGIOGRAPHY N/A 10/21/2019   Procedure: RIGHT/LEFT HEART CATH AND CORONARY ANGIOGRAPHY;  Surgeon: Troy Sine, MD;  Location: [facility];  Service: Cardiovascular;  Laterality: N/A;    Allergies  Allergies  Allergen Reactions  . Fish-Derived Products Anaphylaxis  . Shellfish Allergy Shortness Of Breath    In dye for scan.  .  Aspirin Nausea Only and Other (See Comments)    Pt stated it only gives her problems when she takes the high dose (325 mg)  . Ivp Dye [Iodinated Diagnostic Agents]     Hx of ? anaphlaxis  . Latex Other (See Comments)    Pt says she gets a "stinky infection"    History of Present Illness    Nicole [name] has a PMH of coronary artery disease status post PCA, acute combined systolic and diastolic CHF, diabetes mellitus, depression, GERD, DOE, cardiomyopathy, and aphasia.  She was diagnosed with COVID-19 pneumonia 4/21.  She presented to the emergency department on 10/16/2019 with complaints of progressively worsening shortness of breath, DOE, and generalized weakness.  She indicated that she had not felt well since having COVID-19 pneumonia.  She continued to have dyspnea on exertion and chronic chest discomfort.  She indicated that her symptoms have progressed over the previous 2 weeks directing her to present to the emergency department.  On arrival to the ED her high-sensitivity troponins were slightly elevated.  Her EKG showed sinus tachycardia with left atrial enlargement and possible lateral injury.  Her CXR showed cardiomegaly.  She was also noted to have some ST elevation in V3-V6 and LVH.  Her echocardiogram 8 /7 showed an EF  of 20-25%,Global hypokinesis, G2 DD.  She underwent cardiac catheterization on 10/21/2019 which showed first marginal 95% stenosed with no other significant stenosis.  Post-cath she had acute onset of confusion and delirium.  She underwent a head CT which was normal.  Her mental status returned to normal a few hours post-cath.  She presented to the clinic 11/09/2019 for follow-up evaluation and stateed her breathing was better than it was when she was admitted to the hospital in early August.  She stated that she was not sure whether her breathing was related to her asthma or to her heart at that time.  She stated that her mentation was better than it previous was as well.  She had  been slowly increasing her physical activity.  She was now considering the COVID-19 vaccination.  She stated that she  had pneumonia twice since having COVID-19 and felt she was too sick to previously receive the vaccination.  She stated that her blood pressure had been fairly well controlled in the low 100s to 115's over 60s.  I will ordered a BMP today, continued to slowly increase her physical activity, recommended vaccination for COVID-19, and had her follow-up in 1 month.  I planned to titrate Entresto at that time.  She presents to the clinic today for follow-up evaluation states***  Today she denies chest pain, increased shortness of breath, lower extremity edema, fatigue, palpitations, melena, hematuria, hemoptysis, diaphoresis, weakness, presyncope, syncope, orthopnea, and PND.   Home Medications    Prior to Admission medications   Medication Sig Start Date End Date Taking? Authorizing Provider  Ascorbic Acid (VITAMIN C PO) Take 1 tablet by mouth daily.    [provider]  aspirin 81 MG tablet Take 81 mg by mouth daily.    [provider]  atorvastatin (LIPITOR) 80 MG tablet Take 1 tablet (80 mg total) by mouth at bedtime. 11/19/19 12/19/19  Martinique, Peter M, MD  DULoxetine (CYMBALTA) 60 MG capsule Take 60 mg by mouth daily. 08/10/19   [provider]  ergocalciferol (VITAMIN D2) 50000 UNITS capsule Take 50,000 Units by mouth once a week.      [provider]  fenofibrate 160 MG tablet Take 160 mg by mouth at bedtime.     [provider]  fluticasone (FLONASE) 50 MCG/ACT nasal spray Place 1 spray into both nostrils daily as needed for allergies or rhinitis.  05/28/19   [provider]  Fluticasone-Salmeterol (ADVAIR DISKUS) 250-50 MCG/DOSE AEPB Inhale 1 puff into the lungs 2 (two) times daily.    [provider]  loratadine (CLARITIN) 10 MG tablet Take 1 tablet (10 mg total) by mouth daily. 06/28/19   Ghimire, Henreitta Leber, MD    LORazepam (ATIVAN) 1 MG tablet Take 1 mg by mouth 2 (two) times daily.     [provider]  metoprolol succinate (TOPROL-XL) 25 MG 24 hr tablet Take 1 tablet (25 mg total) by mouth daily. 11/19/19 12/19/19  Martinique, Peter M, MD  NONFORMULARY OR COMPOUNDED ITEM Estradiol 0.02% vaginal cream prefilled applicators apply one twice weekly 05/23/15   Fontaine, Belinda Block, MD  [facility] DELICA LANCETS 81X MISC  06/03/16   [provider]  [facility] VERIO test strip  06/03/16   [provider]  oxyCODONE-acetaminophen (PERCOCET) 10-325 MG per tablet Take 1 tablet by mouth 2 (two) times daily as needed for pain.     [provider]  pantoprazole (PROTONIX) 40 MG tablet Take 1 tablet (40 mg total) by mouth 2 (  two) times daily. 08/14/11   Ladene Artist, MD  potassium chloride SA (KLOR-CON) 20 MEQ tablet Take 2 tablets (40 mEq total) by mouth daily. 11/19/19 12/19/19  Martinique, Peter M, MD  PROAIR HFA 108 [phone] Base) MCG/ACT inhaler Inhale 2 puffs into the lungs every 4 (four) hours as needed for wheezing or shortness of breath. Uses as rescue inhaler Reported on 06/20/2015 Patient taking differently: Inhale 2 puffs into the lungs every 4 (four) hours as needed for wheezing or shortness of breath. Uses as rescue inhaler Reported on 10/16/2019 06/28/19   Jonetta Osgood, MD  sacubitril-valsartan (ENTRESTO) 24-26 MG Take 1 tablet by mouth 2 (two) times daily. 11/19/19 12/19/19  Martinique, Peter M, MD  ticagrelor (BRILINTA) 90 MG TABS tablet Take 1 tablet (90 mg total) by mouth 2 (two) times daily. 11/19/19 12/19/19  Martinique, Peter M, MD    Family History    Family History  Problem Relation Age of Onset  . Hypertension Mother   . Heart failure Mother   . Diabetes Mother   . Hypertension Father   . Heart failure Father   . Diabetes Father   . Heart attack Father   . Stroke Father   . Cancer Brother        lung  . Thyroid disease Brother   . Cancer Brother        throat  . Cancer  Paternal Aunt        bone  . Cancer Paternal Uncle        lung  . Hypertension Maternal Grandmother   . Hypertension Maternal Grandfather   . Hypertension Paternal Grandmother   . Hypertension Paternal Grandfather   . Diabetes Daughter   . Colon cancer Neg Hx   . Stomach cancer Neg Hx    She indicated that her mother is deceased. She indicated that her father is deceased. She indicated that two of her five brothers are alive. She indicated that the status of her maternal grandmother is unknown. She indicated that the status of her maternal grandfather is unknown. She indicated that the status of her paternal grandmother is unknown. She indicated that the status of her paternal grandfather is unknown. She indicated that only one of her two daughters is alive. She indicated that the status of her paternal aunt is unknown. She indicated that the status of her paternal uncle is unknown. She indicated that the status of her neg hx is unknown.  Social History    Social History   Socioeconomic History  . Marital status: Widowed    Spouse name: Not on file  . Number of children: 1  . Years of education: Not on file  . Highest education level: Not on file  Occupational History  . Occupation: disability  Tobacco Use  . Smoking status: Never Smoker  . Smokeless tobacco: Never Used  Substance and Sexual Activity  . Alcohol use: Yes    Alcohol/week: 0.0 standard drinks    Comment: occasional  . Drug use: No  . Sexual activity: Not Currently    Comment: 1st intercourse 74 yo-Fewer than 5 partners  Other Topics Concern  . Not on file  Social History Narrative  . Not on file   Social Determinants of Health   Financial Resource Strain:   . Difficulty of Paying Living Expenses: Not on file  Food Insecurity:   . Worried About Charity fundraiser in the Last Year: Not on file  . Ran Out of Food  in the Last Year: Not on file  Transportation Needs:   . Lack of Transportation (Medical): Not  on file  . Lack of Transportation (Non-Medical): Not on file  Physical Activity:   . Days of Exercise per Week: Not on file  . Minutes of Exercise per Session: Not on file  Stress:   . Feeling of Stress : Not on file  Social Connections:   . Frequency of Communication with Friends and Family: Not on file  . Frequency of Social Gatherings with Friends and Family: Not on file  . Attends Religious Services: Not on file  . Active Member of Clubs or Organizations: Not on file  . Attends Archivist Meetings: Not on file  . Marital Status: Not on file  Intimate Partner Violence:   . Fear of Current or Ex-Partner: Not on file  . Emotionally Abused: Not on file  . Physically Abused: Not on file  . Sexually Abused: Not on file     Review of Systems    General:  No chills, fever, night sweats or weight changes.  Cardiovascular:  No chest pain, dyspnea on exertion, edema, orthopnea, palpitations, paroxysmal nocturnal dyspnea. Dermatological: No rash, lesions/masses Respiratory: No cough, dyspnea Urologic: No hematuria, dysuria Abdominal:   No nausea, vomiting, diarrhea, bright red blood per rectum, melena, or hematemesis Neurologic:  No visual changes, wkns, changes in mental status. All other systems reviewed and are otherwise negative except as noted above.  Physical Exam    VS:  There were no vitals taken for this visit. , BMI There is no height or weight on file to calculate BMI. GEN: Well nourished, well developed, in no acute distress. HEENT: normal. Neck: Supple, no JVD, carotid bruits, or masses. Cardiac: RRR, no murmurs, rubs, or gallops. No clubbing, cyanosis, edema.  Radials/DP/PT 2+ and equal bilaterally.  Respiratory:  Respirations regular and unlabored, clear to auscultation bilaterally. GI: Soft, nontender, nondistended, BS + x 4. MS: no deformity or atrophy. Skin: warm and dry, no rash. Neuro:  Strength and sensation are intact. Psych: Normal  affect.  Accessory Clinical Findings    Recent Labs: 06/27/2019: ALT 19 06/28/2019: Magnesium 1.8 10/16/2019: B Natriuretic Peptide 504.9 10/21/2019: TSH 1.089 10/22/2019: Hemoglobin 11.9; Platelets 249 11/09/2019: BUN 22; Creatinine, Ser 1.09; Potassium 4.9; Sodium 140   Recent Lipid Panel    Component Value Date/Time   CHOL 182 10/22/2019 0705   TRIG 130 10/22/2019 0705   HDL 46 10/22/2019 0705   CHOLHDL 4.0 10/22/2019 0705   VLDL 26 10/22/2019 0705   LDLCALC 110 (H) 10/22/2019 0705    ECG personally reviewed by me today- *** - No acute changes EKG 11/09/2019 normal sinus rhythm no ST or T wave deviation 88 bpm- No acute changes  EKG 10/22/2019 Normal sinus rhythm T wave abnormality consider lateral ischemia 81 bpm  Echocardiogram 10/17/2019 IMPRESSIONS    1. Left ventricular ejection fraction, by estimation, is 20 to 25%. The  left ventricle has severely decreased function. The left ventricle  demonstrates global hypokinesis. The left ventricular internal cavity size  was mildly dilated. Left ventricular  diastolic parameters are consistent with Grade II diastolic dysfunction  (pseudonormalization). Elevated left ventricular end-diastolic pressure.  2. Right ventricular systolic function is normal. The right ventricular  size is normal. There is normal pulmonary artery systolic pressure. The  estimated right ventricular systolic pressure is 35.3 mmHg.  3. Left atrial size was severely dilated.  4. The mitral valve is normal in structure. Mild mitral  valve  regurgitation. No evidence of mitral stenosis.  5. The aortic valve is tricuspid. Aortic valve regurgitation is trivial.  Mild to moderate aortic valve sclerosis/calcification is present, without  any evidence of aortic stenosis.  6. The inferior vena cava is normal in size with greater than 50%  respiratory variability, suggesting right atrial pressure of 3 mmHg.  7. There is right bowing of the interatrial  septum, suggestive of  elevated left atrial pressure. No atrial level shunt detected by color  flow Doppler.   Cardiac catheterization 10/21/19   1st Mrg lesion is 95% stenosed.  Post intervention, there is a 0% residual stenosis.  Prox RCA to Mid RCA lesion is 20% stenosed.  Prox LAD lesion is 20% stenosed.  Mid LAD lesion is 40% stenosed.  A stent was successfully placed.  Coronary obstructive disease with 20% proximal and 40% smooth mid LAD stenoses; 30% proximal circumflex stenosis with 95% near ostial stenosis in a bifurcating OM1 vessel; and 20% mid RCA narrowing.  Normal to minimally increased right heart pressures; mean PA pressure 21 mmHg  Successful PCI to 95% very proximal circumflex marginal stenosis with insertion of a 2.25 x 8 mm DES stent with the stenosis being reduced to 0%.  RECOMMENDATION: Recommend DAPT for 12 months. Guideline directed medical therapy for the patient's significant cardiomyopathy which is out of proportion to her high-grade circumflex marginal stenosis. Aggressive lipid-lowering therapy with target LDL less than 70.  Diagnostic Dominance: Right  Intervention     Assessment & Plan   1.  Acute combined CHF-continues to improve with her endurance/activity.  ***Somewhat improved activity intolerance and less DOE.  Echocardiogram 10/17/2019 showed an EF of 20-25% with global hypokinesis, G2 DD, elevated LVEDP, severe LAE, and mild MR.  Underwent cardiac catheterization 10/21/2019 which showed 95% OM lesion was treated with PCI/DES x1, 20% medial RCA, 20% proximal LAD had 40% medial LAD.  It was felt that her cardiomyopathy was out of proportion to her high-grade OM stenosis.  Given her positive COVID-19 infection 4/21 it was felt that it may have contributed. Increased Entresto ***49/51 Continue metoprolol, furosemide Heart healthy low-sodium diet-salty 6 given Increase physical activity as tolerated Plan to repeat echocardiogram in 3  months to evaluate for improvement in EF with plans to evaluate for ICD if EF remains low.  Will need follow-up with EP. Order BMP  Coronary artery disease-no chest pain.  Status post cardiac catheterization 10/21/2019.which showed 95% OM lesion was treated with PCI/DES x1, 20% medial RCA, 20% proximal LAD had 40% medial LAD.  Continue aspirin, Brilinta, metoprolol, atorvastatin Heart healthy low-sodium diet-salty 6 given Increase physical activity as tolerated  Essential hypertension-BP today 118/68*** today.  Well-controlled at home. Continue metoprolol Heart healthy low-sodium diet-salty 6 given Increase physical activity as tolerated  Type 2 diabetes-A1c 6.5 on recent hospital admission. Continue Farxiga Heart healthy low-sodium carb modified diet Increase physical activity as tolerated Followed by PCP  Altered mental status-continues at baseline.  Was noted to have confusion, and possible aphasia following cardiac catheterization.  Underwent head CT which was negative for acute findings. Follows with neurology  Disposition: Follow-up with Dr. Martinique or myself in 1 month.    Jossie Ng. Swayzee Wadley NP-C    12/08/2019, 6:50 AM [place] [address] Office [phone] Fax [phone]  Notice: This dictation was prepared with Dragon dictation along with smaller phrase technology. Any transcriptional errors that result from this process are unintentional and may not be corrected  upon review.

## 2019-12-09 ENCOUNTER — Ambulatory Visit: Payer: Medicare Other | Admitting: General Practice

## 2019-12-11 ENCOUNTER — Inpatient Hospital Stay (HOSPITAL_COMMUNITY)
Admission: EM | Admit: 2019-12-11 | Discharge: 2019-12-18 | DRG: 871 | Disposition: A | Discharge status: Skilled Nursing Facility | Payer: Medicare Other | Attending: Internal Medicine | Admitting: Internal Medicine

## 2019-12-11 ENCOUNTER — Emergency Department (HOSPITAL_COMMUNITY): Payer: Medicare Other

## 2019-12-11 ENCOUNTER — Other Ambulatory Visit: Payer: Self-pay

## 2019-12-11 ENCOUNTER — Encounter (HOSPITAL_COMMUNITY): Payer: Self-pay | Admitting: Emergency Medicine

## 2019-12-11 DIAGNOSIS — R0682 Tachypnea, not elsewhere classified: Secondary | ICD-10-CM

## 2019-12-11 DIAGNOSIS — T424X6A Underdosing of benzodiazepines, initial encounter: Secondary | ICD-10-CM | POA: Diagnosis present

## 2019-12-11 DIAGNOSIS — E876 Hypokalemia: Secondary | ICD-10-CM | POA: Diagnosis present

## 2019-12-11 DIAGNOSIS — R778 Other specified abnormalities of plasma proteins: Secondary | ICD-10-CM | POA: Diagnosis present

## 2019-12-11 DIAGNOSIS — I214 Non-ST elevation (NSTEMI) myocardial infarction: Secondary | ICD-10-CM

## 2019-12-11 DIAGNOSIS — E1165 Type 2 diabetes mellitus with hyperglycemia: Secondary | ICD-10-CM | POA: Diagnosis not present

## 2019-12-11 DIAGNOSIS — E782 Mixed hyperlipidemia: Secondary | ICD-10-CM | POA: Diagnosis present

## 2019-12-11 DIAGNOSIS — T471X6A Underdosing of other antacids and anti-gastric-secretion drugs, initial encounter: Secondary | ICD-10-CM | POA: Diagnosis present

## 2019-12-11 DIAGNOSIS — I5042 Chronic combined systolic (congestive) and diastolic (congestive) heart failure: Secondary | ICD-10-CM | POA: Diagnosis present

## 2019-12-11 DIAGNOSIS — R1110 Vomiting, unspecified: Secondary | ICD-10-CM

## 2019-12-11 DIAGNOSIS — D649 Anemia, unspecified: Secondary | ICD-10-CM | POA: Diagnosis present

## 2019-12-11 DIAGNOSIS — I248 Other forms of acute ischemic heart disease: Secondary | ICD-10-CM | POA: Diagnosis present

## 2019-12-11 DIAGNOSIS — E663 Overweight: Secondary | ICD-10-CM | POA: Diagnosis present

## 2019-12-11 DIAGNOSIS — E872 Acidosis: Secondary | ICD-10-CM | POA: Diagnosis present

## 2019-12-11 DIAGNOSIS — Z9049 Acquired absence of other specified parts of digestive tract: Secondary | ICD-10-CM

## 2019-12-11 DIAGNOSIS — Z91041 Radiographic dye allergy status: Secondary | ICD-10-CM

## 2019-12-11 DIAGNOSIS — Z91040 Latex allergy status: Secondary | ICD-10-CM

## 2019-12-11 DIAGNOSIS — J9601 Acute respiratory failure with hypoxia: Secondary | ICD-10-CM | POA: Diagnosis not present

## 2019-12-11 DIAGNOSIS — E669 Obesity, unspecified: Secondary | ICD-10-CM | POA: Diagnosis present

## 2019-12-11 DIAGNOSIS — R6520 Severe sepsis without septic shock: Secondary | ICD-10-CM | POA: Diagnosis present

## 2019-12-11 DIAGNOSIS — R079 Chest pain, unspecified: Secondary | ICD-10-CM | POA: Diagnosis present

## 2019-12-11 DIAGNOSIS — R0600 Dyspnea, unspecified: Secondary | ICD-10-CM

## 2019-12-11 DIAGNOSIS — R32 Unspecified urinary incontinence: Secondary | ICD-10-CM | POA: Diagnosis present

## 2019-12-11 DIAGNOSIS — Z808 Family history of malignant neoplasm of other organs or systems: Secondary | ICD-10-CM

## 2019-12-11 DIAGNOSIS — I110 Hypertensive heart disease with heart failure: Secondary | ICD-10-CM | POA: Diagnosis present

## 2019-12-11 DIAGNOSIS — F05 Delirium due to known physiological condition: Secondary | ICD-10-CM | POA: Diagnosis present

## 2019-12-11 DIAGNOSIS — I428 Other cardiomyopathies: Secondary | ICD-10-CM | POA: Diagnosis present

## 2019-12-11 DIAGNOSIS — Z79899 Other long term (current) drug therapy: Secondary | ICD-10-CM

## 2019-12-11 DIAGNOSIS — W19XXXA Unspecified fall, initial encounter: Secondary | ICD-10-CM | POA: Diagnosis present

## 2019-12-11 DIAGNOSIS — J4520 Mild intermittent asthma, uncomplicated: Secondary | ICD-10-CM | POA: Diagnosis present

## 2019-12-11 DIAGNOSIS — Z801 Family history of malignant neoplasm of trachea, bronchus and lung: Secondary | ICD-10-CM

## 2019-12-11 DIAGNOSIS — E119 Type 2 diabetes mellitus without complications: Secondary | ICD-10-CM

## 2019-12-11 DIAGNOSIS — Z90710 Acquired absence of both cervix and uterus: Secondary | ICD-10-CM

## 2019-12-11 DIAGNOSIS — T45526A Underdosing of antithrombotic drugs, initial encounter: Secondary | ICD-10-CM | POA: Diagnosis present

## 2019-12-11 DIAGNOSIS — I252 Old myocardial infarction: Secondary | ICD-10-CM

## 2019-12-11 DIAGNOSIS — Z8616 Personal history of COVID-19: Secondary | ICD-10-CM

## 2019-12-11 DIAGNOSIS — H532 Diplopia: Secondary | ICD-10-CM | POA: Diagnosis present

## 2019-12-11 DIAGNOSIS — Z8349 Family history of other endocrine, nutritional and metabolic diseases: Secondary | ICD-10-CM

## 2019-12-11 DIAGNOSIS — R1013 Epigastric pain: Secondary | ICD-10-CM | POA: Diagnosis present

## 2019-12-11 DIAGNOSIS — I5043 Acute on chronic combined systolic (congestive) and diastolic (congestive) heart failure: Secondary | ICD-10-CM | POA: Diagnosis not present

## 2019-12-11 DIAGNOSIS — R000 Tachycardia, unspecified: Secondary | ICD-10-CM

## 2019-12-11 DIAGNOSIS — T503X6A Underdosing of electrolytic, caloric and water-balance agents, initial encounter: Secondary | ICD-10-CM | POA: Diagnosis present

## 2019-12-11 DIAGNOSIS — T445X6A Underdosing of predominantly beta-adrenoreceptor agonists, initial encounter: Secondary | ICD-10-CM | POA: Diagnosis present

## 2019-12-11 DIAGNOSIS — Z781 Physical restraint status: Secondary | ICD-10-CM

## 2019-12-11 DIAGNOSIS — S0990XA Unspecified injury of head, initial encounter: Secondary | ICD-10-CM | POA: Diagnosis present

## 2019-12-11 DIAGNOSIS — I2510 Atherosclerotic heart disease of native coronary artery without angina pectoris: Secondary | ICD-10-CM | POA: Diagnosis present

## 2019-12-11 DIAGNOSIS — E869 Volume depletion, unspecified: Secondary | ICD-10-CM | POA: Diagnosis present

## 2019-12-11 DIAGNOSIS — Z5320 Procedure and treatment not carried out because of patient's decision for unspecified reasons: Secondary | ICD-10-CM | POA: Diagnosis not present

## 2019-12-11 DIAGNOSIS — H5509 Other forms of nystagmus: Secondary | ICD-10-CM | POA: Diagnosis present

## 2019-12-11 DIAGNOSIS — E1169 Type 2 diabetes mellitus with other specified complication: Secondary | ICD-10-CM | POA: Diagnosis present

## 2019-12-11 DIAGNOSIS — J690 Pneumonitis due to inhalation of food and vomit: Secondary | ICD-10-CM | POA: Diagnosis present

## 2019-12-11 DIAGNOSIS — F32A Depression, unspecified: Secondary | ICD-10-CM | POA: Diagnosis present

## 2019-12-11 DIAGNOSIS — F13939 Sedative, hypnotic or anxiolytic use, unspecified with withdrawal, unspecified: Secondary | ICD-10-CM

## 2019-12-11 DIAGNOSIS — I255 Ischemic cardiomyopathy: Secondary | ICD-10-CM | POA: Diagnosis present

## 2019-12-11 DIAGNOSIS — Z8719 Personal history of other diseases of the digestive system: Secondary | ICD-10-CM

## 2019-12-11 DIAGNOSIS — Z833 Family history of diabetes mellitus: Secondary | ICD-10-CM

## 2019-12-11 DIAGNOSIS — H5500 Unspecified nystagmus: Secondary | ICD-10-CM | POA: Diagnosis present

## 2019-12-11 DIAGNOSIS — F419 Anxiety disorder, unspecified: Secondary | ICD-10-CM | POA: Diagnosis present

## 2019-12-11 DIAGNOSIS — Z9861 Coronary angioplasty status: Secondary | ICD-10-CM

## 2019-12-11 DIAGNOSIS — K2100 Gastro-esophageal reflux disease with esophagitis, without bleeding: Secondary | ICD-10-CM | POA: Diagnosis present

## 2019-12-11 DIAGNOSIS — A419 Sepsis, unspecified organism: Secondary | ICD-10-CM | POA: Diagnosis not present

## 2019-12-11 DIAGNOSIS — Z7902 Long term (current) use of antithrombotics/antiplatelets: Secondary | ICD-10-CM

## 2019-12-11 DIAGNOSIS — F13239 Sedative, hypnotic or anxiolytic dependence with withdrawal, unspecified: Secondary | ICD-10-CM | POA: Diagnosis present

## 2019-12-11 DIAGNOSIS — Z955 Presence of coronary angioplasty implant and graft: Secondary | ICD-10-CM

## 2019-12-11 DIAGNOSIS — Z6828 Body mass index (BMI) 28.0-28.9, adult: Secondary | ICD-10-CM

## 2019-12-11 DIAGNOSIS — T466X6A Underdosing of antihyperlipidemic and antiarteriosclerotic drugs, initial encounter: Secondary | ICD-10-CM | POA: Diagnosis present

## 2019-12-11 DIAGNOSIS — D72829 Elevated white blood cell count, unspecified: Secondary | ICD-10-CM | POA: Insufficient documentation

## 2019-12-11 DIAGNOSIS — Z7982 Long term (current) use of aspirin: Secondary | ICD-10-CM

## 2019-12-11 DIAGNOSIS — R112 Nausea with vomiting, unspecified: Secondary | ICD-10-CM

## 2019-12-11 DIAGNOSIS — R197 Diarrhea, unspecified: Secondary | ICD-10-CM | POA: Diagnosis present

## 2019-12-11 DIAGNOSIS — Z8249 Family history of ischemic heart disease and other diseases of the circulatory system: Secondary | ICD-10-CM

## 2019-12-11 DIAGNOSIS — M797 Fibromyalgia: Secondary | ICD-10-CM | POA: Diagnosis present

## 2019-12-11 DIAGNOSIS — E7220 Disorder of urea cycle metabolism, unspecified: Secondary | ICD-10-CM | POA: Diagnosis present

## 2019-12-11 DIAGNOSIS — Z823 Family history of stroke: Secondary | ICD-10-CM

## 2019-12-11 DIAGNOSIS — F329 Major depressive disorder, single episode, unspecified: Secondary | ICD-10-CM | POA: Diagnosis present

## 2019-12-11 DIAGNOSIS — Z91138 Patient's unintentional underdosing of medication regimen for other reason: Secondary | ICD-10-CM

## 2019-12-11 DIAGNOSIS — T447X6A Underdosing of beta-adrenoreceptor antagonists, initial encounter: Secondary | ICD-10-CM | POA: Diagnosis present

## 2019-12-11 HISTORY — DX: Mild intermittent asthma, uncomplicated: J45.20

## 2019-12-11 HISTORY — DX: Migraine, unspecified, not intractable, without status migrainosus: G43.909

## 2019-12-11 HISTORY — DX: COVID-19: U07.1

## 2019-12-11 HISTORY — DX: Irritable bowel syndrome, unspecified: K58.9

## 2019-12-11 HISTORY — DX: Type 2 diabetes mellitus without complications: E11.9

## 2019-12-11 LAB — BASIC METABOLIC PANEL
Anion gap: 14 (ref 5–15)
BUN: <5 mg/dL — ABNORMAL LOW (ref 8–23)
CO2: 20 mmol/L — ABNORMAL LOW (ref 22–32)
Calcium: 7.3 mg/dL — ABNORMAL LOW (ref 8.9–10.3)
Chloride: 105 mmol/L (ref 98–111)
Creatinine, Ser: 0.94 mg/dL (ref 0.44–1.00)
GFR calc Af Amer: >60 mL/min (ref >60)
GFR calc non Af Amer: >60 mL/min (ref >60)
Glucose, Bld: 254 mg/dL — ABNORMAL HIGH (ref 70–99)
Potassium: 3.6 mmol/L (ref 3.5–5.1)
Sodium: 139 mmol/L (ref 135–145)

## 2019-12-11 LAB — HEPATIC FUNCTION PANEL
ALT: 21 U/L (ref 0–44)
AST: 32 U/L (ref 15–41)
Albumin: 3.6 g/dL (ref 3.5–5.0)
Alkaline Phosphatase: 63 U/L (ref 38–126)
Bilirubin, Direct: 0.2 mg/dL (ref 0.0–0.2)
Indirect Bilirubin: 1.1 mg/dL — ABNORMAL HIGH (ref 0.3–0.9)
Total Bilirubin: 1.3 mg/dL — ABNORMAL HIGH (ref 0.3–1.2)
Total Protein: 7.2 g/dL (ref 6.5–8.1)

## 2019-12-11 LAB — CBC
HCT: 39.4 % (ref 36.0–46.0)
Hemoglobin: 13.3 g/dL (ref 12.0–15.0)
MCH: 30 pg (ref 26.0–34.0)
MCHC: 33.8 g/dL (ref 30.0–36.0)
MCV: 88.7 fL (ref 80.0–100.0)
Platelets: 331 10*3/uL (ref 150–400)
RBC: 4.44 MIL/uL (ref 3.87–5.11)
RDW: 13.8 % (ref 11.5–15.5)
WBC: 20.3 10*3/uL — ABNORMAL HIGH (ref 4.0–10.5)
nRBC: 0 % (ref 0.0–0.2)

## 2019-12-11 LAB — TROPONIN I (HIGH SENSITIVITY)
Troponin I (High Sensitivity): 218 ng/L (ref <18)
Troponin I (High Sensitivity): 377 ng/L (ref <18)

## 2019-12-11 LAB — TSH: TSH: 2.114 u[IU]/mL (ref 0.350–4.500)

## 2019-12-11 LAB — D-DIMER, QUANTITATIVE: D-Dimer, Quant: 0.77 ug/mL-FEU — ABNORMAL HIGH (ref 0.00–0.50)

## 2019-12-11 LAB — AMMONIA: Ammonia: 55 umol/L — ABNORMAL HIGH (ref 9–35)

## 2019-12-11 LAB — LACTIC ACID, PLASMA: Lactic Acid, Venous: 3.4 mmol/L (ref 0.5–1.9)

## 2019-12-11 MED ORDER — POTASSIUM CHLORIDE CRYS ER 20 MEQ PO TBCR: 20.0000 meq | EXTENDED_RELEASE_TABLET | Freq: Once | ORAL | Status: DC

## 2019-12-11 MED ORDER — METOPROLOL TARTRATE 5 MG/5ML IV SOLN
5.0000 mg | Freq: Once | INTRAVENOUS | Status: AC
  Administered 2019-12-11: 5 mg via INTRAVENOUS
  Filled 2019-12-11: qty 5

## 2019-12-11 MED ORDER — LORAZEPAM 2 MG/ML IJ SOLN
1.0000 mg | Freq: Once | INTRAMUSCULAR | Status: AC
  Administered 2019-12-11: 1 mg via INTRAVENOUS
  Filled 2019-12-11: qty 1

## 2019-12-11 MED ORDER — SODIUM CHLORIDE 0.9 % IV BOLUS
500.0000 mL | Freq: Once | INTRAVENOUS | Status: AC
  Administered 2019-12-11: 500 mL via INTRAVENOUS

## 2019-12-11 NOTE — ED Notes (Signed)
Patients daughter states the patient fell two days ago and hit her face and wants scans done to check her neuro status. Triage rn made aware. Vitals checked.

## 2019-12-11 NOTE — H&P (Signed)
History and Physical    CHASSIDY LAYSON ZOX:[id number] DOB: [birth year] DOA: 12/11/2019  PCP: [name], MD   Patient coming from: Home.   I have personally briefly reviewed patient's old medical records in [place]  Chief Complaint: Nausea, vomiting and dizziness.  HPI: ERMELINDA ECKERT is a 74 y.o. female with medical history significant of anemia, anxiety, depression, chronic back pain, bloating, occasional blurred vision, colon polyps, dizziness, history endometriosis, fatigue, fibromyalgia, memory difficulties, GERD, headache/migraine, arthralgias, lower extremity weakness, mild intermittent asthma, hyperlipidemia, overweight, type II DM, urinary incontinence, vaginitis who was brought to the emergency department by her daughter due to nausea, vomiting, diarrhea and dizziness since Thursday.  She denies melena or hematochezia.  No dysuria, frequency or hematuria.  She also complains of substernal/epigastric discomfort and palpitations.  She denies PND, orthopnea or recent pitting edema of the lower extremities.  No fever, chills, but stated she had some night sweats.  She denies dyspnea, productive cough, wheezing or hemoptysis.  She denies polyuria, polydipsia, polyphagia or blurred vision.  The patient also has not taken her medications in the last 2+ days.  She mentioned that she last took them on Thursday morning.  ED Course: Initial vital signs were temperature EMS gave the patient 500 mL of NS bolus and 4 mg of ondansetron IVP.  CBC shows a white count 20.3, hemoglobin 13.3 g/dL and platelets 331.Troponin was 218 and then 377 ng/L.  LFTs show a mildly increased bilirubin at 1.3 mg/dL, but were otherwise normal. BMP shows normal sodium, potassium, chloride and renal function.  CO2 was 20 mmol/L.  Her anion gap was 14.  Glucose is 254 and calcium 7.3 mg/dL.Ammonia level was 55.  TSH was normal.  Lactic acid 3.4 mmol/L.  D-dimer was 0.77.  Imaging: Her chest radiograph did not  have any acute abnormality.  There was no effusion and cardiomediastinal contours are stable.  CT head and MRI of brain did not show any acute intracranial abnormality.  Please see images and full radiology report for further detail.  Review of Systems: As per HPI otherwise all other systems reviewed and are negative.  Past Medical History:  Diagnosis Date  . Anemia   . Anxiety   . Back pain   . Bloating   . Blurred vision    occasional  . CAD S/P percutaneous coronary angioplasty 10/22/2019  . Chest pain   . Colon polyps   . Dizziness   . Endometriosis    history of  . Fatigue   . Fibromyalgia   . Forgetfulness   . GERD with esophagitis    Follows with Dr. Fuller Plan with GI, EGD in the past  . Headache    migraines  . Joint pain   . Leg weakness   . Major depressive disorder   . Mild intermittent asthma, uncomplicated   . Mixed diabetic hyperlipidemia associated with type 2 diabetes mellitus ([place])   . Obesity   . SOB (shortness of breath)   . Type 2 diabetes mellitus without complication, without long-term current use of insulin ([place])   . Urinary incontinence   . Vaginitis    Past Surgical History:  Procedure Laterality Date  . ABDOMINAL HYSTERECTOMY     TAH BSO  . APPENDECTOMY  1999  . CATARACT EXTRACTION     both  . CHOLECYSTECTOMY    . CORONARY STENT INTERVENTION N/A 10/21/2019   Procedure: CORONARY STENT INTERVENTION;  Surgeon: Troy Sine, MD;  Location: [facility];  Service: Cardiovascular;  Laterality: N/A;  . DENTAL SURGERY    . HERNIA REPAIR  2000  . LAPAROSCOPIC ENDOMETRIOSIS FULGURATION    . RIGHT/LEFT HEART CATH AND CORONARY ANGIOGRAPHY N/A 10/21/2019   Procedure: RIGHT/LEFT HEART CATH AND CORONARY ANGIOGRAPHY;  Surgeon: Troy Sine, MD;  Location: [facility];  Service: Cardiovascular;  Laterality: N/A;   Social History  reports that she has never smoked. She has never used smokeless tobacco. She reports current alcohol use. She  reports that she does not use drugs.  Allergies  Allergen Reactions  . Fish-Derived Products Anaphylaxis  . Shellfish Allergy Shortness Of Breath    In dye for scan.  . Aspirin Nausea Only and Other (See Comments)    Pt stated it only gives her problems when she takes the high dose (325 mg)  . Ivp Dye [Iodinated Diagnostic Agents]     Hx of ? anaphlaxis  . Latex Other (See Comments)    Pt says she gets a "stinky infection"   Family History  Problem Relation Age of Onset  . Hypertension Mother   . Heart failure Mother   . Diabetes Mother   . Hypertension Father   . Heart failure Father   . Diabetes Father   . Heart attack Father   . Stroke Father   . Cancer Brother        lung  . Thyroid disease Brother   . Cancer Brother        throat  . Cancer Paternal Aunt        bone  . Cancer Paternal Uncle        lung  . Hypertension Maternal Grandmother   . Hypertension Maternal Grandfather   . Hypertension Paternal Grandmother   . Hypertension Paternal Grandfather   . Diabetes Daughter   . Colon cancer Neg Hx   . Stomach cancer Neg Hx    Prior to Admission medications   Medication Sig Start Date End Date Taking? Authorizing Provider  Ascorbic Acid (VITAMIN C PO) Take 1 tablet by mouth daily.    [provider]  aspirin 81 MG tablet Take 81 mg by mouth daily.    [provider]  atorvastatin (LIPITOR) 80 MG tablet Take 1 tablet (80 mg total) by mouth at bedtime. 11/19/19 12/19/19  Martinique, Peter M, MD  DULoxetine (CYMBALTA) 60 MG capsule Take 60 mg by mouth daily. 08/10/19   [provider]  ergocalciferol (VITAMIN D2) 50000 UNITS capsule Take 50,000 Units by mouth once a week.      [provider]  fenofibrate 160 MG tablet Take 160 mg by mouth at bedtime.     [provider]  fluticasone (FLONASE) 50 MCG/ACT nasal spray Place 1 spray into both nostrils daily as needed for allergies or rhinitis.  05/28/19   [provider]   Fluticasone-Salmeterol (ADVAIR DISKUS) 250-50 MCG/DOSE AEPB Inhale 1 puff into the lungs 2 (two) times daily.    [provider]  loratadine (CLARITIN) 10 MG tablet Take 1 tablet (10 mg total) by mouth daily. 06/28/19   Ghimire, Henreitta Leber, MD  LORazepam (ATIVAN) 1 MG tablet Take 1 mg by mouth 2 (two) times daily.     [provider]  metoprolol succinate (TOPROL-XL) 25 MG 24 hr tablet Take 1 tablet (25 mg total) by mouth daily. 11/19/19 12/19/19  Martinique, Peter M, MD  NONFORMULARY OR COMPOUNDED ITEM Estradiol 0.02% vaginal cream prefilled applicators apply one twice weekly 05/23/15  Fontaine, Belinda Block, MD  [facility] DELICA LANCETS 50Y MISC  06/03/16   [provider]  [facility] VERIO test strip  06/03/16   [provider]  oxyCODONE-acetaminophen (PERCOCET) 10-325 MG per tablet Take 1 tablet by mouth 2 (two) times daily as needed for pain.     [provider]  pantoprazole (PROTONIX) 40 MG tablet Take 1 tablet (40 mg total) by mouth 2 (two) times daily. 08/14/11   Ladene Artist, MD  potassium chloride SA (KLOR-CON) 20 MEQ tablet Take 2 tablets (40 mEq total) by mouth daily. 11/19/19 12/19/19  Martinique, Peter M, MD  PROAIR HFA 108 [phone] Base) MCG/ACT inhaler Inhale 2 puffs into the lungs every 4 (four) hours as needed for wheezing or shortness of breath. Uses as rescue inhaler Reported on 06/20/2015 Patient taking differently: Inhale 2 puffs into the lungs every 4 (four) hours as needed for wheezing or shortness of breath. Uses as rescue inhaler Reported on 10/16/2019 06/28/19   Jonetta Osgood, MD  sacubitril-valsartan (ENTRESTO) 24-26 MG Take 1 tablet by mouth 2 (two) times daily. 11/19/19 12/19/19  Martinique, Peter M, MD  ticagrelor (BRILINTA) 90 MG TABS tablet Take 1 tablet (90 mg total) by mouth 2 (two) times daily. 11/19/19 12/19/19  Martinique, Peter M, MD   Physical Exam: Vitals:   12/11/19 2219 12/11/19 2300 12/11/19 2315 12/11/19 2330  BP: 130/88 114/81     Pulse: (!) 126 (!) 120 (!) 125 (!) 125  Resp: (!) 22 (!) 25 (!) 25 (!) 24  Temp:      TempSrc:      SpO2: 95% 96% 95% 94%  Weight:      Height:       Constitutional: Looks acutely ill. Eyes: PERRL, lids and conjunctivae are injected. ENMT: Mucous membranes are dry. Posterior pharynx clear of any exudate or lesions. Neck: normal, supple, no masses, no thyromegaly Respiratory: Decreased breath sounds on bases, otherwise clear to auscultation bilaterally, no wheezing, no crackles. Normal respiratory effort. No accessory muscle use.  Cardiovascular: Tachycardic with a regular rhythm in the 110s and 120s, no murmurs / rubs / gallops. No extremity edema. 2+ pedal pulses. No carotid bruits.  Abdomen: Nondistended.  BS positive.  Soft, positive for gastric tenderness, no guarding or rebound, no masses palpated. No hepatosplenomegaly. Bowel sounds positive.  Musculoskeletal: Generalized weakness.  No clubbing / cyanosis.  Good ROM, no contractures. Normal muscle tone.  Skin: no significant rashes, lesions, ulcers on limited dermatological examination. Neurologic: CN 2-12 grossly intact. Sensation intact, DTR normal. Strength 5/5 in all 4.  Psychiatric: Normal judgment and insight. Alert and oriented x 3. Normal mood.   Labs on Admission: I have personally reviewed following labs and imaging studies  CBC: Recent Labs  Lab 12/11/19 1622  WBC 20.3*  HGB 13.3  HCT 39.4  MCV 88.7  PLT 412   Basic Metabolic Panel: Recent Labs  Lab 12/11/19 1622  NA 139  K 3.6  CL 105  CO2 20*  GLUCOSE 254*  BUN <5*  CREATININE 0.94  CALCIUM 7.3*   GFR: Estimated Creatinine Clearance: 49.6 mL/min (by C-G formula based on SCr of 0.94 mg/dL).  Liver Function Tests: Recent Labs  Lab 12/11/19 2117  AST 32  ALT 21  ALKPHOS 63  BILITOT 1.3*  PROT 7.2  ALBUMIN 3.6   Radiological Exams on Admission: DG Chest 2 View  Result Date: 12/11/2019 CLINICAL DATA:  Shortness of breath. EXAM: CHEST - 2  VIEW COMPARISON:  October 16, 2019. FINDINGS: Stable cardiomediastinal silhouette. No pneumothorax or pleural effusion is noted. Both lungs are clear. The visualized skeletal structures are unremarkable. IMPRESSION: No active cardiopulmonary disease. Electronically Signed   By: Marijo Conception M.D.   On: 12/11/2019 16:54   CT Head Wo Contrast  Result Date: 12/11/2019 CLINICAL DATA:  Nausea vomiting dizziness EXAM: CT HEAD WITHOUT CONTRAST TECHNIQUE: Contiguous axial images were obtained from the base of the skull through the vertex without intravenous contrast. COMPARISON:  October 21, 2019 FINDINGS: Brain: No evidence of acute territorial infarction, hemorrhage, hydrocephalus,extra-axial collection or mass lesion/mass effect. There is dilatation the ventricles and sulci consistent with age-related atrophy. Low-attenuation changes in the deep white matter consistent with small vessel ischemia. Vascular: No hyperdense vessel or unexpected calcification. Skull: The skull is intact. No fracture or focal lesion identified. Sinuses/Orbits: The visualized paranasal sinuses and mastoid air cells are clear. The orbits and globes intact. Other: None IMPRESSION: No acute intracranial abnormality. Findings consistent with age related atrophy and chronic small vessel ischemia Electronically Signed   By: Prudencio Pair M.D.   On: 12/11/2019 21:49   10/17/2019 echocardiogram complete  IMPRESSIONS: 1. Left ventricular ejection fraction, by estimation, is 20 to 25%. The  left ventricle has severely decreased function. The left ventricle  demonstrates global hypokinesis. The left ventricular internal cavity size  was mildly dilated. Left ventricular  diastolic parameters are consistent with Grade II diastolic dysfunction  (pseudonormalization). Elevated left ventricular end-diastolic pressure.  2. Right ventricular systolic function is normal. The right ventricular  size is normal. There is normal pulmonary artery  systolic pressure. The  estimated right ventricular systolic pressure is 03.7 mmHg.  3. Left atrial size was severely dilated.  4. The mitral valve is normal in structure. Mild mitral valve  regurgitation. No evidence of mitral stenosis.  5. The aortic valve is tricuspid. Aortic valve regurgitation is trivial.  Mild to moderate aortic valve sclerosis/calcification is present, without  any evidence of aortic stenosis.  6. The inferior vena cava is normal in size with greater than 50%  respiratory variability, suggesting right atrial pressure of 3 mmHg.  7. There is right bowing of the interatrial septum, suggestive of  elevated left atrial pressure. No atrial level shunt detected by color  flow Doppler.  10/21/2019 right/left heart cath and coronary angiography.  CORONARY STENT INTERVENTION  RIGHT/LEFT HEART CATH AND CORONARY ANGIOGRAPHY  Conclusion    1st Mrg lesion is 95% stenosed.  Post intervention, there is a 0% residual stenosis.  Prox RCA to Mid RCA lesion is 20% stenosed.  Prox LAD lesion is 20% stenosed.  Mid LAD lesion is 40% stenosed.  A stent was successfully placed.   Coronary obstructive disease with 20% proximal and 40% smooth mid LAD stenoses; 30% proximal circumflex stenosis with 95% near ostial stenosis in a bifurcating OM1 vessel; and 20% mid RCA narrowing.  Normal to minimally increased right heart pressures; mean PA pressure 21 mmHg  Successful PCI to 95% very proximal circumflex marginal stenosis with insertion of a 2.25 x 8 mm DES stent with the stenosis being reduced to 0%.  RECOMMENDATION: Recommend DAPT for 12 months.  Guideline directed medical therapy for the patient's significant cardiomyopathy which is out of proportion to her high-grade circumflex marginal stenosis.  Aggressive lipid-lowering therapy with target LDL less than 70.  EKG: Independently reviewed.  Vent. rate 135 BPM PR interval 166 ms QRS duration 90 ms QT/QTc 256/384  ms P-R-T axes 50 -10 66 Sinus tachycardia Possible  Left atrial enlargement Nonspecific T wave abnormality Abnormal ECG  Assessment/Plan Principal Problem:   Sepsis due to undetermined organism POA ([place]) Admit to telemetry/inpatient. Continue supplemental oxygen. Gentle time-limited IV hydration. Continue ceftriaxone 1 g IVPB daily. Continue metronidazole 500 mg IVPB every 8 hours. Follow blood cultures and sensitivity. Follow-up CBC and CMP.  Active Problems:   Elevated troponin   CAD S/P recent percutaneous coronary angioplasty Check echocardiogram. Trend troponin level    Chronic combined systolic and diastolic congestive heart failure (HCC) Volume depleted. No signs of decompensation at this time.    Nystagmus Medication withdrawal? Seems to be improving with treatment. Imaging has been benign.    Anxiety Continue lorazepam 1 mg p.o. twice daily.    Depression Continue duloxetine 60 mg p.o. daily.    GERD with esophagitis Continue pantoprazole 40 mg p.o. twice daily.    Mixed diabetic hyperlipidemia associated with type 2 diabetes mellitus (HCC) Continue atorvastatin 80 mg p.o. daily.    Type 2 diabetes mellitus (HCC) Carbohydrate modified diet. CBG monitoring with RI SS.    Hyperammonemia (HCC) Recheck level in a.m.    Hypocalcemia Recheck level in a.m. Check phosphorus and magnesium.     DVT prophylaxis: Lovenox [place]. Code Status:   Full code. Family Communication: Disposition Plan:   Patient is from:  Home.  Anticipated DC to:  Home.  Anticipated DC date:  12/14/2019  Anticipated DC barriers: Clinical status.  Consults called: Admission status:  Observation/telemetry.  High due to  Severity of Illness:  Reubin Milan MD [facility]  How to contact the [facility] Attending or Consulting provider [place] or covering provider during after hours [place], for this patient?   1. Check the care team in [facility] and look for a)  attending/consulting [facility] provider listed and b) the [facility] team listed 2. Log into [email or web address] and use [place]'s universal password to access. If you do not have the password, please contact the hospital operator. 3. Locate the [facility] provider you are looking for under [facility] and page to a number that you can be directly reached. 4. If you still have difficulty reaching the provider, please page the [facility] (Director on Call) for the Hospitalists listed on amion for assistance.  12/11/2019, 11:56 PM   This document was prepared using Dragon voice recognition software and may contain some unintended transcription errors.

## 2019-12-11 NOTE — ED Triage Notes (Signed)
Pt to triage via [facility] from home.  Reports nausea, vomiting, and dizziness x 2-3 days.  Hasn't took BP medications in 2 days.  18g R AC.  NS 500cc bolus and Zofran 4mg  IV.  Also reports SOB.

## 2019-12-11 NOTE — ED Notes (Addendum)
Date and time results received: 12/11/19 5:43 PM  (use smartphrase ".now" to insert current time)  Test: troponin  Critical Value: 218  Name of Provider Notified: curatolo   Orders Received? Or Actions Taken?:

## 2019-12-11 NOTE — ED Provider Notes (Addendum)
[facility] EMERGENCY DEPARTMENT Provider Note   CSN: [id number] Arrival date & time: 12/11/19  1607     History Chief Complaint  Patient presents with  . Dizziness  . Emesis    Nicole [name] is a 74 y.o. female.  Patient with history of coronary artery disease, heart failure, stent placements on 10/21/2019, expressive aphasia after that procedure thought due to delirium, CT angio head and neck at that time were negative --presents the emergency department with multiple complaints.  Patient is on Brilinta currently.  She had a fall 2 nights ago and struck the right forehead.  She did not seek medical attention.  Since that time she has had headache, nausea and vomiting, has not tolerated medications including chronic Ativan and metoprolol, jerking motions of her eyes, blurry vision.  Headache is generalized --patient states that she gets migraine headaches almost every day.  She has also had epigastric pain which she relates is being constant.  No shortness of breath or trouble breathing.  No loss of vision or curtains in her vision.  No difficulty speaking.  She has been able to ambulate at home since the fall, but states that she has been tending towards staying in bed.  No diarrhea or urinary symptoms.  Onset of symptoms acute.  Course is constant.        Past Medical History:  Diagnosis Date  . Anemia   . Anxiety   . Back pain   . Bloating   . Blurred vision    occasional  . Chest pain   . Colon polyps   . Dizziness   . Endometriosis    history of  . Fatigue   . Fibromyalgia   . Forgetfulness   . GERD with esophagitis    Follows with Dr. Fuller Plan with GI, EGD in the past  . Headache    migraines  . Joint pain   . Leg weakness   . Major depressive disorder   . Mild intermittent asthma, uncomplicated   . Mixed diabetic hyperlipidemia associated with type 2 diabetes mellitus ([place])   . Obesity   . SOB (shortness of breath)   . Type 2 diabetes  mellitus without complication, without long-term current use of insulin ([place])   . Urinary incontinence   . Vaginitis     Patient Active Problem List   Diagnosis Date Noted  . Acute combined systolic and diastolic congestive heart failure ([place]) 10/22/2019  . CAD S/P percutaneous coronary angioplasty 10/22/2019  . Aphasia 10/21/2019  . Elevated troponin   . Cardiomyopathy ([place])   . DOE (dyspnea on exertion) 10/16/2019  . Mild intermittent asthma with (acute) exacerbation 06/24/2019  . COVID-19 virus infection 06/24/2019  . Acute metabolic encephalopathy 67/[birth year]1937  . Hypokalemia, inadequate intake 06/24/2019  . Hypomagnesemia 06/24/2019  . GERD with esophagitis   . Major depressive disorder   . Type 2 diabetes mellitus without complication, without long-term current use of insulin ([place])   . Mixed diabetic hyperlipidemia associated with type 2 diabetes mellitus ([place])   . Chronic daily headache 12/07/2014  . Memory loss 08/31/2014  . Worsening headaches 08/31/2014  . Atypical chest pain 11/05/2013  . Anemia   . Anxiety   . Depression   . Diabetes mellitus type 2 in obese ([place])   . Urinary incontinence   . Endometriosis   . Asthma   . Fibromyalgia     Past Surgical History:  Procedure Laterality Date  . ABDOMINAL HYSTERECTOMY  TAH BSO  . APPENDECTOMY  1999  . CATARACT EXTRACTION     both  . CHOLECYSTECTOMY    . CORONARY STENT INTERVENTION N/A 10/21/2019   Procedure: CORONARY STENT INTERVENTION;  Surgeon: Troy Sine, MD;  Location: [facility];  Service: Cardiovascular;  Laterality: N/A;  . DENTAL SURGERY    . HERNIA REPAIR  2000  . LAPAROSCOPIC ENDOMETRIOSIS FULGURATION    . RIGHT/LEFT HEART CATH AND CORONARY ANGIOGRAPHY N/A 10/21/2019   Procedure: RIGHT/LEFT HEART CATH AND CORONARY ANGIOGRAPHY;  Surgeon: Troy Sine, MD;  Location: [facility];  Service: Cardiovascular;  Laterality: N/A;     OB History    Gravida  1   Para  1   Term       Preterm      AB      Living  1     SAB      TAB      Ectopic      Multiple      Live Births              Family History  Problem Relation Age of Onset  . Hypertension Mother   . Heart failure Mother   . Diabetes Mother   . Hypertension Father   . Heart failure Father   . Diabetes Father   . Heart attack Father   . Stroke Father   . Cancer Brother        lung  . Thyroid disease Brother   . Cancer Brother        throat  . Cancer Paternal Aunt        bone  . Cancer Paternal Uncle        lung  . Hypertension Maternal Grandmother   . Hypertension Maternal Grandfather   . Hypertension Paternal Grandmother   . Hypertension Paternal Grandfather   . Diabetes Daughter   . Colon cancer Neg Hx   . Stomach cancer Neg Hx     Social History   Tobacco Use  . Smoking status: Never Smoker  . Smokeless tobacco: Never Used  Substance Use Topics  . Alcohol use: Yes    Alcohol/week: 0.0 standard drinks    Comment: occasional  . Drug use: No    Home Medications Prior to Admission medications   Medication Sig Start Date End Date Taking? Authorizing Provider  Ascorbic Acid (VITAMIN C PO) Take 1 tablet by mouth daily.    [provider]  aspirin 81 MG tablet Take 81 mg by mouth daily.    [provider]  atorvastatin (LIPITOR) 80 MG tablet Take 1 tablet (80 mg total) by mouth at bedtime. 11/19/19 12/19/19  Martinique, Peter M, MD  DULoxetine (CYMBALTA) 60 MG capsule Take 60 mg by mouth daily. 08/10/19   [provider]  ergocalciferol (VITAMIN D2) 50000 UNITS capsule Take 50,000 Units by mouth once a week.      [provider]  fenofibrate 160 MG tablet Take 160 mg by mouth at bedtime.     [provider]  fluticasone (FLONASE) 50 MCG/ACT nasal spray Place 1 spray into both nostrils daily as needed for allergies or rhinitis.  05/28/19   [provider]  Fluticasone-Salmeterol (ADVAIR DISKUS) 250-50 MCG/DOSE AEPB Inhale 1 puff  into the lungs 2 (two) times daily.    [provider]  loratadine (CLARITIN) 10 MG tablet Take 1 tablet (10 mg total) by mouth daily. 06/28/19   Ghimire, Henreitta Leber, MD  LORazepam (ATIVAN) 1 MG tablet Take 1 mg by mouth 2 (two) times daily.     [provider]  metoprolol succinate (TOPROL-XL) 25 MG 24 hr tablet Take 1 tablet (25 mg total) by mouth daily. 11/19/19 12/19/19  [place], Peter M, MD  NONFORMULARY OR COMPOUNDED ITEM Estradiol 0.02% vaginal cream prefilled applicators apply one twice weekly 05/23/15   Fontaine, Belinda Block, MD  [facility] DELICA LANCETS 18A MISC  06/03/16   [provider]  [facility] VERIO test strip  06/03/16   [provider]  oxyCODONE-acetaminophen (PERCOCET) 10-325 MG per tablet Take 1 tablet by mouth 2 (two) times daily as needed for pain.     [provider]  pantoprazole (PROTONIX) 40 MG tablet Take 1 tablet (40 mg total) by mouth 2 (two) times daily. 08/14/11   Ladene Artist, MD  potassium chloride SA (KLOR-CON) 20 MEQ tablet Take 2 tablets (40 mEq total) by mouth daily. 11/19/19 12/19/19  [place], Peter M, MD  PROAIR HFA 108 [phone] Base) MCG/ACT inhaler Inhale 2 puffs into the lungs every 4 (four) hours as needed for wheezing or shortness of breath. Uses as rescue inhaler Reported on 06/20/2015 Patient taking differently: Inhale 2 puffs into the lungs every 4 (four) hours as needed for wheezing or shortness of breath. Uses as rescue inhaler Reported on 10/16/2019 06/28/19   Jonetta Osgood, MD  sacubitril-valsartan (ENTRESTO) 24-26 MG Take 1 tablet by mouth 2 (two) times daily. 11/19/19 12/19/19  [place]ique, Peter M, MD  ticagrelor (BRILINTA) 90 MG TABS tablet Take 1 tablet (90 mg total) by mouth 2 (two) times daily. 11/19/19 12/19/19  [place], Peter M, MD    Allergies    Fish-derived products, Shellfish allergy, Aspirin, Ivp dye [iodinated diagnostic agents], and Latex  Review of Systems   Review of Systems  Constitutional: Negative for  fever.  HENT: Negative for rhinorrhea and sore throat.   Eyes: Positive for visual disturbance. Negative for redness.  Respiratory: Negative for cough and shortness of breath.   Cardiovascular: Positive for chest pain.  Gastrointestinal: Positive for abdominal pain (epigastric), nausea and vomiting. Negative for diarrhea.  Genitourinary: Negative for dysuria, frequency, hematuria and urgency.  Musculoskeletal: Negative for myalgias.  Skin: Negative for rash.  Neurological: Positive for dizziness and headaches.    Physical Exam Updated Vital Signs BP (!) 152/96   Pulse (!) 143   Temp 98 F (36.7 C) (Oral)   Resp (!) 31   Ht 5\' 2"  (1.575 m)   Wt 70.1 kg   SpO2 90%   BMI 28.27 kg/m   Physical Exam Vitals and nursing note reviewed.  Constitutional:      Appearance: She is well-developed. She is not diaphoretic.  HENT:     Head: Normocephalic and atraumatic.     Mouth/Throat:     Mouth: Mucous membranes are not dry.  Eyes:     Conjunctiva/sclera: Conjunctivae normal.     Comments: Question mild proptosis on initial exam.   Neck:     Vascular: Normal carotid pulses. No carotid bruit or JVD.     Trachea: Trachea normal. No tracheal deviation.  Cardiovascular:     Rate and Rhythm: Regular rhythm. Tachycardia present.     Pulses: No decreased pulses.     Heart sounds: S1 normal and S2 normal. Murmur heard.   Pulmonary:     Effort: Pulmonary effort is normal. No respiratory distress.     Breath sounds: No wheezing.  Chest:     Chest  wall: No tenderness.  Abdominal:     General: Bowel sounds are normal.     Palpations: Abdomen is soft.     Tenderness: There is no abdominal tenderness. There is no guarding or rebound.  Musculoskeletal:        General: Normal range of motion.     Cervical back: Normal range of motion and neck supple. No muscular tenderness.  Skin:    General: Skin is warm and dry.     Coloration: Skin is not pale.  Neurological:     Mental Status: She  is alert.     Cranial Nerves: Cranial nerves are intact.     Motor: Tremor present. No weakness or pronator drift.     Coordination: Coordination abnormal.     Comments: Vertical nystagmus noted on initial exam. Gait not tested. Normal speech.      ED Results / Procedures / Treatments   Labs (all labs ordered are listed, but only abnormal results are displayed) Labs Reviewed  BASIC METABOLIC PANEL - Abnormal; Notable for the following components:      Result Value   CO2 20 (*)    Glucose, Bld 254 (*)    BUN <5 (*)    Calcium 7.3 (*)    All other components within normal limits  CBC - Abnormal; Notable for the following components:   WBC 20.3 (*)    All other components within normal limits  HEPATIC FUNCTION PANEL - Abnormal; Notable for the following components:   Total Bilirubin 1.3 (*)    Indirect Bilirubin 1.1 (*)    All other components within normal limits  AMMONIA - Abnormal; Notable for the following components:   Ammonia 55 (*)    All other components within normal limits  LACTIC ACID, PLASMA - Abnormal; Notable for the following components:   Lactic Acid, Venous 3.4 (*)    All other components within normal limits  D-DIMER, QUANTITATIVE (NOT AT [facility]) - Abnormal; Notable for the following components:   D-Dimer, Quant 0.77 (*)    All other components within normal limits  TROPONIN I (HIGH SENSITIVITY) - Abnormal; Notable for the following components:   Troponin I (High Sensitivity) 218 (*)    All other components within normal limits  TROPONIN I (HIGH SENSITIVITY) - Abnormal; Notable for the following components:   Troponin I (High Sensitivity) 377 (*)    All other components within normal limits  RESPIRATORY PANEL BY RT PCR (FLU A&B, COVID)  TSH  URINALYSIS, ROUTINE W REFLEX MICROSCOPIC  MAGNESIUM    EKG EKG Interpretation  Date/Time:  Saturday December 11 2019 16:12:49 EDT Ventricular Rate:  135 PR Interval:  166 QRS Duration: 90 QT Interval:  256 QTC  Calculation: 384 R Axis:   -10 Text Interpretation: Sinus tachycardia Possible Left atrial enlargement Nonspecific T wave abnormality Abnormal ECG No acute changes Confirmed by Varney Biles ([id number]) on 12/11/2019 10:30:10 PM   Radiology DG Chest 2 View  Result Date: 12/11/2019 CLINICAL DATA:  Shortness of breath. EXAM: CHEST - 2 VIEW COMPARISON:  October 16, 2019. FINDINGS: Stable cardiomediastinal silhouette. No pneumothorax or pleural effusion is noted. Both lungs are clear. The visualized skeletal structures are unremarkable. IMPRESSION: No active cardiopulmonary disease. Electronically Signed   By: Marijo Conception M.D.   On: 12/11/2019 16:54   CT Head Wo Contrast  Result Date: 12/11/2019 CLINICAL DATA:  Nausea vomiting dizziness EXAM: CT HEAD WITHOUT CONTRAST TECHNIQUE: Contiguous axial images were obtained from the base of the skull  through the vertex without intravenous contrast. COMPARISON:  October 21, 2019 FINDINGS: Brain: No evidence of acute territorial infarction, hemorrhage, hydrocephalus,extra-axial collection or mass lesion/mass effect. There is dilatation the ventricles and sulci consistent with age-related atrophy. Low-attenuation changes in the deep white matter consistent with small vessel ischemia. Vascular: No hyperdense vessel or unexpected calcification. Skull: The skull is intact. No fracture or focal lesion identified. Sinuses/Orbits: The visualized paranasal sinuses and mastoid air cells are clear. The orbits and globes intact. Other: None IMPRESSION: No acute intracranial abnormality. Findings consistent with age related atrophy and chronic small vessel ischemia Electronically Signed   By: Prudencio Pair M.D.   On: 12/11/2019 21:49    Procedures Procedures (including critical care time)  Medications Ordered in ED Medications  potassium chloride SA (KLOR-CON) CR tablet 20 mEq (has no administration in time range)  LORazepam (ATIVAN) injection 1 mg (1 mg Intravenous Given  12/11/19 2124)  sodium chloride 0.9 % bolus 500 mL (0 mLs Intravenous Stopped 12/11/19 2255)  metoprolol tartrate (LOPRESSOR) injection 5 mg (5 mg Intravenous Given 12/11/19 2124)  LORazepam (ATIVAN) injection 1 mg (1 mg Intravenous Given 12/11/19 2333)    ED Course  I have reviewed the triage vital signs and the nursing notes.  Pertinent labs & imaging results that were available during my care of the patient were reviewed by me and considered in my medical decision making (see chart for details).  Patient seen and examined.  Patient with very strange initial examination.  She had poorly coordinated movements with questionable proptosis and persistent vertical nystagmus.  She was tachycardic into the 140s.  I ordered a bolus of IV fluids, IV Ativan, IV metoprolol given that she takes these chronically but not has not had them in 2 days.  I added additional labs including TSH, hepatic function panel, ammonia.  Head CT ordered.  At this point, suspect that symptoms are not due to sepsis, however white blood cell count is 20,000.  Will reassess after treatment.  Troponin also noted to be elevated, second troponin pending.  Vital signs reviewed and are as follows: BP (!) 152/96   Pulse (!) 143   Temp 98 F (36.7 C) (Oral)   Resp (!) 31   Ht 5\' 2"  (1.575 m)   Wt 70.1 kg   SpO2 90%   BMI 28.27 kg/m   10:01 PM CT without obvious bleeding.  Patient's heart rate improved into the 110s and overall she appears better.  Now complaining of double vision.  She continues to have nystagmus, however now appears to be horizontal.  Awaiting labs. Will continue to monitor.   No obvious infectious etiology on exam. I think that elevated lactic acid likely related to elevated HR. Strongly doubt sepsis at this point based on history and exam.   Troponin 218 >> 377. Suspect this is related to elevated HR.  I discussed this with cardiology (Dr. Emilio Aspen) given recent catheterization and stent placement.  He agrees  with trending troponins.  He requests that if inpatient team feels he needs a formal consult, for them to call.   11:12 PM Patient discussed with and seen by Dr. Kathrynn Humble. MRI, d-dimer ordered.    BP 114/81   Pulse (!) 120   Temp 98 F (36.7 C) (Oral)   Resp (!) 25   Ht 5\' 2"  (1.575 m)   Wt 70.1 kg   SpO2 96%   BMI 28.27 kg/m    Will consult hospitalist for admission.   11:39 PM  Discussed with Dr. Olevia Bowens of [facility] who will see for admission. Dr. Leonette Monarch also aware of patient and will monitor.     MDM Rules/Calculators/A&P                          Admit.   Final Clinical Impression(s) / ED Diagnoses Final diagnoses:  Injury of head, initial encounter  Non-intractable vomiting with nausea, unspecified vomiting type  Sinus tachycardia  Elevated troponin  Benzodiazepine withdrawal with complication [facility])    Rx / DC Orders ED Discharge Orders    None          Carlisle Cater, PA-C 12/12/19 [id number]    Varney Biles, MD 12/22/19 1547

## 2019-12-11 NOTE — ED Notes (Signed)
Patient transported to CT 

## 2019-12-11 NOTE — ED Notes (Signed)
Date and time results received: 12/11/19  (use smartphrase ".now" to insert current time)  Test:lactic Critical Value: 3.0  Name of Provider Notified: Kathrynn Humble Orders Received? Or Actions Taken?:

## 2019-12-12 ENCOUNTER — Inpatient Hospital Stay (HOSPITAL_COMMUNITY): Payer: Medicare Other

## 2019-12-12 ENCOUNTER — Observation Stay (HOSPITAL_COMMUNITY): Payer: Medicare Other

## 2019-12-12 ENCOUNTER — Encounter (HOSPITAL_COMMUNITY): Payer: Self-pay | Admitting: Internal Medicine

## 2019-12-12 DIAGNOSIS — I428 Other cardiomyopathies: Secondary | ICD-10-CM

## 2019-12-12 DIAGNOSIS — F419 Anxiety disorder, unspecified: Secondary | ICD-10-CM | POA: Diagnosis present

## 2019-12-12 DIAGNOSIS — W19XXXA Unspecified fall, initial encounter: Secondary | ICD-10-CM | POA: Diagnosis present

## 2019-12-12 DIAGNOSIS — I5043 Acute on chronic combined systolic (congestive) and diastolic (congestive) heart failure: Secondary | ICD-10-CM | POA: Diagnosis not present

## 2019-12-12 DIAGNOSIS — A419 Sepsis, unspecified organism: Secondary | ICD-10-CM

## 2019-12-12 DIAGNOSIS — I5042 Chronic combined systolic (congestive) and diastolic (congestive) heart failure: Secondary | ICD-10-CM | POA: Diagnosis not present

## 2019-12-12 DIAGNOSIS — E7220 Disorder of urea cycle metabolism, unspecified: Secondary | ICD-10-CM | POA: Diagnosis present

## 2019-12-12 DIAGNOSIS — R4182 Altered mental status, unspecified: Secondary | ICD-10-CM | POA: Diagnosis not present

## 2019-12-12 DIAGNOSIS — I248 Other forms of acute ischemic heart disease: Secondary | ICD-10-CM | POA: Diagnosis present

## 2019-12-12 DIAGNOSIS — E1169 Type 2 diabetes mellitus with other specified complication: Secondary | ICD-10-CM | POA: Diagnosis present

## 2019-12-12 DIAGNOSIS — Z8616 Personal history of COVID-19: Secondary | ICD-10-CM | POA: Diagnosis not present

## 2019-12-12 DIAGNOSIS — F13239 Sedative, hypnotic or anxiolytic dependence with withdrawal, unspecified: Secondary | ICD-10-CM | POA: Diagnosis present

## 2019-12-12 DIAGNOSIS — Z955 Presence of coronary angioplasty implant and graft: Secondary | ICD-10-CM | POA: Diagnosis not present

## 2019-12-12 DIAGNOSIS — R778 Other specified abnormalities of plasma proteins: Secondary | ICD-10-CM

## 2019-12-12 DIAGNOSIS — R079 Chest pain, unspecified: Secondary | ICD-10-CM | POA: Diagnosis present

## 2019-12-12 DIAGNOSIS — R6520 Severe sepsis without septic shock: Secondary | ICD-10-CM | POA: Diagnosis present

## 2019-12-12 DIAGNOSIS — I255 Ischemic cardiomyopathy: Secondary | ICD-10-CM | POA: Diagnosis present

## 2019-12-12 DIAGNOSIS — I5021 Acute systolic (congestive) heart failure: Secondary | ICD-10-CM | POA: Diagnosis not present

## 2019-12-12 DIAGNOSIS — R1013 Epigastric pain: Secondary | ICD-10-CM | POA: Diagnosis present

## 2019-12-12 DIAGNOSIS — E782 Mixed hyperlipidemia: Secondary | ICD-10-CM | POA: Diagnosis present

## 2019-12-12 DIAGNOSIS — Z91041 Radiographic dye allergy status: Secondary | ICD-10-CM | POA: Diagnosis not present

## 2019-12-12 DIAGNOSIS — I214 Non-ST elevation (NSTEMI) myocardial infarction: Secondary | ICD-10-CM | POA: Diagnosis not present

## 2019-12-12 DIAGNOSIS — J9601 Acute respiratory failure with hypoxia: Secondary | ICD-10-CM | POA: Diagnosis not present

## 2019-12-12 DIAGNOSIS — F05 Delirium due to known physiological condition: Secondary | ICD-10-CM | POA: Diagnosis present

## 2019-12-12 DIAGNOSIS — J690 Pneumonitis due to inhalation of food and vomit: Secondary | ICD-10-CM | POA: Diagnosis present

## 2019-12-12 DIAGNOSIS — I25119 Atherosclerotic heart disease of native coronary artery with unspecified angina pectoris: Secondary | ICD-10-CM | POA: Diagnosis not present

## 2019-12-12 DIAGNOSIS — F329 Major depressive disorder, single episode, unspecified: Secondary | ICD-10-CM | POA: Diagnosis present

## 2019-12-12 DIAGNOSIS — I2510 Atherosclerotic heart disease of native coronary artery without angina pectoris: Secondary | ICD-10-CM | POA: Diagnosis present

## 2019-12-12 DIAGNOSIS — E872 Acidosis: Secondary | ICD-10-CM | POA: Diagnosis present

## 2019-12-12 DIAGNOSIS — E1165 Type 2 diabetes mellitus with hyperglycemia: Secondary | ICD-10-CM | POA: Diagnosis not present

## 2019-12-12 LAB — PROTIME-INR
INR: 1.1 (ref 0.8–1.2)
Prothrombin Time: 13.7 seconds (ref 11.4–15.2)

## 2019-12-12 LAB — COMPREHENSIVE METABOLIC PANEL
ALT: 26 U/L (ref 0–44)
ALT: 26 U/L (ref 0–44)
AST: 56 U/L — ABNORMAL HIGH (ref 15–41)
AST: 60 U/L — ABNORMAL HIGH (ref 15–41)
Albumin: 3.1 g/dL — ABNORMAL LOW (ref 3.5–5.0)
Albumin: 2.9 g/dL — ABNORMAL LOW (ref 3.5–5.0)
Alkaline Phosphatase: 53 U/L (ref 38–126)
Alkaline Phosphatase: 52 U/L (ref 38–126)
Anion gap: 15 (ref 5–15)
Anion gap: 12 (ref 5–15)
BUN: 8 mg/dL (ref 8–23)
BUN: 7 mg/dL — ABNORMAL LOW (ref 8–23)
CO2: 21 mmol/L — ABNORMAL LOW (ref 22–32)
CO2: 21 mmol/L — ABNORMAL LOW (ref 22–32)
Calcium: 6.7 mg/dL — ABNORMAL LOW (ref 8.9–10.3)
Calcium: 6.3 mg/dL — CL (ref 8.9–10.3)
Chloride: 103 mmol/L (ref 98–111)
Chloride: 103 mmol/L (ref 98–111)
Creatinine, Ser: 1 mg/dL (ref 0.44–1.00)
Creatinine, Ser: 0.96 mg/dL (ref 0.44–1.00)
GFR calc Af Amer: >60 mL/min (ref >60)
GFR calc Af Amer: >60 mL/min (ref >60)
GFR calc non Af Amer: 56 mL/min — ABNORMAL LOW (ref >60)
GFR calc non Af Amer: 59 mL/min — ABNORMAL LOW (ref >60)
Glucose, Bld: 192 mg/dL — ABNORMAL HIGH (ref 70–99)
Glucose, Bld: 349 mg/dL — ABNORMAL HIGH (ref 70–99)
Potassium: 3 mmol/L — ABNORMAL LOW (ref 3.5–5.1)
Potassium: 3.5 mmol/L (ref 3.5–5.1)
Sodium: 139 mmol/L (ref 135–145)
Sodium: 136 mmol/L (ref 135–145)
Total Bilirubin: 1.1 mg/dL (ref 0.3–1.2)
Total Bilirubin: 1 mg/dL (ref 0.3–1.2)
Total Protein: 6.1 g/dL — ABNORMAL LOW (ref 6.5–8.1)
Total Protein: 5.9 g/dL — ABNORMAL LOW (ref 6.5–8.1)

## 2019-12-12 LAB — CBC WITH DIFFERENTIAL/PLATELET
Abs Immature Granulocytes: 0.14 10*3/uL — ABNORMAL HIGH (ref 0.00–0.07)
Basophils Absolute: 0 10*3/uL (ref 0.0–0.1)
Basophils Relative: 0 %
Eosinophils Absolute: 0 10*3/uL (ref 0.0–0.5)
Eosinophils Relative: 0 %
HCT: 34.1 % — ABNORMAL LOW (ref 36.0–46.0)
Hemoglobin: 11.4 g/dL — ABNORMAL LOW (ref 12.0–15.0)
Immature Granulocytes: 1 %
Lymphocytes Relative: 11 %
Lymphs Abs: 2 10*3/uL (ref 0.7–4.0)
MCH: 30.1 pg (ref 26.0–34.0)
MCHC: 33.4 g/dL (ref 30.0–36.0)
MCV: 90 fL (ref 80.0–100.0)
Monocytes Absolute: 0.8 10*3/uL (ref 0.1–1.0)
Monocytes Relative: 4 %
Neutro Abs: 14.7 10*3/uL — ABNORMAL HIGH (ref 1.7–7.7)
Neutrophils Relative %: 84 %
Platelets: 254 10*3/uL (ref 150–400)
RBC: 3.79 MIL/uL — ABNORMAL LOW (ref 3.87–5.11)
RDW: 13.9 % (ref 11.5–15.5)
WBC: 17.6 10*3/uL — ABNORMAL HIGH (ref 4.0–10.5)
nRBC: 0 % (ref 0.0–0.2)

## 2019-12-12 LAB — TROPONIN I (HIGH SENSITIVITY)
Troponin I (High Sensitivity): 721 ng/L (ref <18)
Troponin I (High Sensitivity): 694 ng/L (ref <18)
Troponin I (High Sensitivity): 677 ng/L (ref <18)
Troponin I (High Sensitivity): 642 ng/L (ref <18)

## 2019-12-12 LAB — LACTIC ACID, PLASMA: Lactic Acid, Venous: 1.8 mmol/L (ref 0.5–1.9)

## 2019-12-12 LAB — PROCALCITONIN: Procalcitonin: <0.1 ng/mL

## 2019-12-12 LAB — BASIC METABOLIC PANEL WITH GFR
Anion gap: 16 — ABNORMAL HIGH (ref 5–15)
BUN: 7 mg/dL — ABNORMAL LOW (ref 8–23)
CO2: 19 mmol/L — ABNORMAL LOW (ref 22–32)
Calcium: 6.7 mg/dL — ABNORMAL LOW (ref 8.9–10.3)
Chloride: 106 mmol/L (ref 98–111)
Creatinine, Ser: 0.9 mg/dL (ref 0.44–1.00)
GFR calc Af Amer: >60 mL/min
GFR calc non Af Amer: >60 mL/min
Glucose, Bld: 166 mg/dL — ABNORMAL HIGH (ref 70–99)
Potassium: 3 mmol/L — ABNORMAL LOW (ref 3.5–5.1)
Sodium: 141 mmol/L (ref 135–145)

## 2019-12-12 LAB — PHOSPHORUS
Phosphorus: 4.1 mg/dL (ref 2.5–4.6)
Phosphorus: 3.9 mg/dL (ref 2.5–4.6)

## 2019-12-12 LAB — CBC
HCT: 35.3 % — ABNORMAL LOW (ref 36.0–46.0)
Hemoglobin: 12 g/dL (ref 12.0–15.0)
MCH: 30.8 pg (ref 26.0–34.0)
MCHC: 34 g/dL (ref 30.0–36.0)
MCV: 90.7 fL (ref 80.0–100.0)
Platelets: 287 10*3/uL (ref 150–400)
RBC: 3.89 MIL/uL (ref 3.87–5.11)
RDW: 14.1 % (ref 11.5–15.5)
WBC: 18.9 10*3/uL — ABNORMAL HIGH (ref 4.0–10.5)
nRBC: 0 % (ref 0.0–0.2)

## 2019-12-12 LAB — BLOOD GAS, ARTERIAL
Acid-base deficit: 5.6 mmol/L — ABNORMAL HIGH (ref 0.0–2.0)
Bicarbonate: 18.3 mmol/L — ABNORMAL LOW (ref 20.0–28.0)
Drawn by: 28338
FIO2: 36
O2 Saturation: 91.1 %
Patient temperature: 39.9
pCO2 arterial: 34.8 mmHg (ref 32.0–48.0)
pH, Arterial: 7.357 (ref 7.350–7.450)
pO2, Arterial: 82.2 mmHg — ABNORMAL LOW (ref 83.0–108.0)

## 2019-12-12 LAB — AMMONIA: Ammonia: 52 umol/L — ABNORMAL HIGH (ref 9–35)

## 2019-12-12 LAB — MAGNESIUM
Magnesium: 0.2 mg/dL — CL (ref 1.7–2.4)
Magnesium: 0.2 mg/dL — CL (ref 1.7–2.4)
Magnesium: 0.9 mg/dL — CL (ref 1.7–2.4)
Magnesium: 3.8 mg/dL — ABNORMAL HIGH (ref 1.7–2.4)

## 2019-12-12 LAB — RESPIRATORY PANEL BY RT PCR (FLU A&B, COVID)
Influenza A by PCR: NEGATIVE
Influenza B by PCR: NEGATIVE
SARS Coronavirus 2 by RT PCR: NEGATIVE

## 2019-12-12 LAB — D-DIMER, QUANTITATIVE: D-Dimer, Quant: 0.99 ug/mL-FEU — ABNORMAL HIGH (ref 0.00–0.50)

## 2019-12-12 LAB — GLUCOSE, CAPILLARY
Glucose-Capillary: 191 mg/dL — ABNORMAL HIGH (ref 70–99)
Glucose-Capillary: 186 mg/dL — ABNORMAL HIGH (ref 70–99)

## 2019-12-12 LAB — BETA-HYDROXYBUTYRIC ACID: Beta-Hydroxybutyric Acid: 0.15 mmol/L (ref 0.05–0.27)

## 2019-12-12 MED ORDER — ADULT MULTIVITAMIN W/MINERALS CH
1.0000 | ORAL_TABLET | Freq: Every day | ORAL | Status: DC
  Administered 2019-12-12 – 2019-12-18 (×7): 1 via ORAL
  Filled 2019-12-12 (×7): qty 1

## 2019-12-12 MED ORDER — MORPHINE SULFATE (PF) 2 MG/ML IV SOLN: 2.0000 mg | INTRAVENOUS | Status: DC | PRN

## 2019-12-12 MED ORDER — METOPROLOL SUCCINATE ER 25 MG PO TB24
25.0000 mg | ORAL_TABLET | Freq: Every day | ORAL | Status: DC
  Administered 2019-12-12: 25 mg via ORAL
  Filled 2019-12-12 (×2): qty 1

## 2019-12-12 MED ORDER — DIAZEPAM 5 MG/ML IJ SOLN
2.5000 mg | Freq: Once | INTRAMUSCULAR | Status: DC
  Filled 2019-12-12: qty 2

## 2019-12-12 MED ORDER — PIPERACILLIN-TAZOBACTAM 3.375 G IVPB
3.3750 g | Freq: Three times a day (TID) | INTRAVENOUS | Status: DC
  Administered 2019-12-12 – 2019-12-14 (×7): 3.375 g via INTRAVENOUS
  Filled 2019-12-12 (×7): qty 50

## 2019-12-12 MED ORDER — LORAZEPAM 2 MG/ML IJ SOLN
0.0000 mg | Freq: Four times a day (QID) | INTRAMUSCULAR | Status: DC
  Administered 2019-12-12: 2 mg via INTRAVENOUS
  Administered 2019-12-12: 3 mg via INTRAVENOUS
  Administered 2019-12-13 (×2): 2 mg via INTRAVENOUS
  Filled 2019-12-12 (×3): qty 1
  Filled 2019-12-12: qty 2

## 2019-12-12 MED ORDER — POTASSIUM CHLORIDE 10 MEQ/100ML IV SOLN
10.0000 meq | INTRAVENOUS | Status: AC
  Administered 2019-12-12 – 2019-12-13 (×4): 10 meq via INTRAVENOUS
  Filled 2019-12-12: qty 100

## 2019-12-12 MED ORDER — MAGNESIUM SULFATE 4 GM/100ML IV SOLN
4.0000 g | Freq: Once | INTRAVENOUS | Status: AC
  Administered 2019-12-12: 4 g via INTRAVENOUS
  Filled 2019-12-12: qty 100

## 2019-12-12 MED ORDER — CALCIUM GLUCONATE-NACL 1-0.675 GM/50ML-% IV SOLN
1.0000 g | Freq: Once | INTRAVENOUS | Status: DC
  Filled 2019-12-12: qty 50

## 2019-12-12 MED ORDER — ASPIRIN 81 MG PO CHEW
81.0000 mg | CHEWABLE_TABLET | Freq: Every day | ORAL | Status: DC
  Administered 2019-12-12 – 2019-12-18 (×7): 81 mg via ORAL
  Filled 2019-12-12 (×7): qty 1

## 2019-12-12 MED ORDER — PREDNISONE 20 MG PO TABS
50.0000 mg | ORAL_TABLET | Freq: Four times a day (QID) | ORAL | Status: DC
  Administered 2019-12-12 (×2): 50 mg via ORAL
  Filled 2019-12-12: qty 3
  Filled 2019-12-12: qty 2

## 2019-12-12 MED ORDER — ACETAMINOPHEN 325 MG PO TABS
650.0000 mg | ORAL_TABLET | Freq: Four times a day (QID) | ORAL | Status: DC | PRN
  Administered 2019-12-13 – 2019-12-17 (×7): 650 mg via ORAL
  Filled 2019-12-12 (×7): qty 2

## 2019-12-12 MED ORDER — LEVALBUTEROL HCL 1.25 MG/0.5ML IN NEBU
1.2500 mg | INHALATION_SOLUTION | Freq: Four times a day (QID) | RESPIRATORY_TRACT | Status: DC
  Administered 2019-12-12: 1.25 mg via RESPIRATORY_TRACT
  Filled 2019-12-12: qty 0.5

## 2019-12-12 MED ORDER — MOMETASONE FURO-FORMOTEROL FUM 200-5 MCG/ACT IN AERO
2.0000 | INHALATION_SPRAY | Freq: Two times a day (BID) | RESPIRATORY_TRACT | Status: DC
  Administered 2019-12-13 – 2019-12-18 (×10): 2 via RESPIRATORY_TRACT
  Filled 2019-12-12 (×2): qty 8.8

## 2019-12-12 MED ORDER — POTASSIUM CHLORIDE CRYS ER 20 MEQ PO TBCR
40.0000 meq | EXTENDED_RELEASE_TABLET | Freq: Two times a day (BID) | ORAL | Status: DC
  Administered 2019-12-12 – 2019-12-14 (×5): 40 meq via ORAL
  Filled 2019-12-12 (×7): qty 2

## 2019-12-12 MED ORDER — METOPROLOL TARTRATE 5 MG/5ML IV SOLN: 5.0000 mg | Freq: Once | INTRAVENOUS | Status: DC

## 2019-12-12 MED ORDER — CALCIUM GLUCONATE-NACL 2-0.675 GM/100ML-% IV SOLN
2.0000 g | Freq: Once | INTRAVENOUS | Status: AC
  Administered 2019-12-12: 2000 mg via INTRAVENOUS
  Filled 2019-12-12: qty 100

## 2019-12-12 MED ORDER — TICAGRELOR 90 MG PO TABS
90.0000 mg | ORAL_TABLET | Freq: Two times a day (BID) | ORAL | Status: DC
  Administered 2019-12-12 – 2019-12-18 (×13): 90 mg via ORAL
  Filled 2019-12-12 (×13): qty 1

## 2019-12-12 MED ORDER — LEVALBUTEROL HCL 1.25 MG/0.5ML IN NEBU
1.2500 mg | INHALATION_SOLUTION | Freq: Three times a day (TID) | RESPIRATORY_TRACT | Status: DC
  Administered 2019-12-12 – 2019-12-13 (×2): 1.25 mg via RESPIRATORY_TRACT
  Filled 2019-12-12 (×2): qty 0.5

## 2019-12-12 MED ORDER — THIAMINE HCL 100 MG/ML IJ SOLN
100.0000 mg | Freq: Every day | INTRAMUSCULAR | Status: DC
  Filled 2019-12-12 (×3): qty 2

## 2019-12-12 MED ORDER — LORAZEPAM 1 MG PO TABS
1.0000 mg | ORAL_TABLET | Freq: Two times a day (BID) | ORAL | Status: DC
  Administered 2019-12-12 – 2019-12-18 (×11): 1 mg via ORAL
  Filled 2019-12-12 (×13): qty 1

## 2019-12-12 MED ORDER — LORAZEPAM 1 MG PO TABS: 0.0000 mg | ORAL_TABLET | Freq: Two times a day (BID) | ORAL | Status: DC

## 2019-12-12 MED ORDER — FUROSEMIDE 10 MG/ML IJ SOLN
20.0000 mg | Freq: Once | INTRAMUSCULAR | Status: AC
  Administered 2019-12-12: 20 mg via INTRAVENOUS
  Filled 2019-12-12: qty 2

## 2019-12-12 MED ORDER — LORATADINE 10 MG PO TABS
10.0000 mg | ORAL_TABLET | Freq: Every day | ORAL | Status: DC
  Administered 2019-12-12: 10 mg via ORAL
  Filled 2019-12-12: qty 1

## 2019-12-12 MED ORDER — LORAZEPAM 1 MG PO TABS: 0.0000 mg | ORAL_TABLET | Freq: Four times a day (QID) | ORAL | Status: DC

## 2019-12-12 MED ORDER — FAMOTIDINE IN NACL 20-0.9 MG/50ML-% IV SOLN
20.0000 mg | Freq: Once | INTRAVENOUS | Status: AC
  Administered 2019-12-12: 20 mg via INTRAVENOUS
  Filled 2019-12-12: qty 50

## 2019-12-12 MED ORDER — FLUTICASONE PROPIONATE 50 MCG/ACT NA SUSP: 1.0000 | Freq: Every day | NASAL | Status: DC | PRN

## 2019-12-12 MED ORDER — DIPHENHYDRAMINE HCL 50 MG/ML IJ SOLN
25.0000 mg | Freq: Once | INTRAMUSCULAR | Status: AC
  Administered 2019-12-12: 25 mg via INTRAVENOUS
  Filled 2019-12-12: qty 1

## 2019-12-12 MED ORDER — SACUBITRIL-VALSARTAN 24-26 MG PO TABS
1.0000 | ORAL_TABLET | Freq: Two times a day (BID) | ORAL | Status: DC
  Administered 2019-12-12 – 2019-12-16 (×8): 1 via ORAL
  Filled 2019-12-12 (×10): qty 1

## 2019-12-12 MED ORDER — LORAZEPAM 2 MG/ML IJ SOLN: 0.0000 mg | Freq: Two times a day (BID) | INTRAMUSCULAR | Status: DC

## 2019-12-12 MED ORDER — PANTOPRAZOLE SODIUM 40 MG IV SOLR
40.0000 mg | INTRAVENOUS | Status: DC
  Administered 2019-12-12 – 2019-12-17 (×6): 40 mg via INTRAVENOUS
  Filled 2019-12-12 (×6): qty 40

## 2019-12-12 MED ORDER — ALBUTEROL SULFATE HFA 108 (90 BASE) MCG/ACT IN AERS
2.0000 | INHALATION_SPRAY | RESPIRATORY_TRACT | Status: DC | PRN
  Administered 2019-12-12: 2 via RESPIRATORY_TRACT
  Filled 2019-12-12: qty 6.7

## 2019-12-12 MED ORDER — LORAZEPAM 2 MG/ML IJ SOLN: 1.0000 mg | Freq: Once | INTRAMUSCULAR | Status: DC

## 2019-12-12 MED ORDER — LORAZEPAM 2 MG/ML IJ SOLN
2.0000 mg | Freq: Once | INTRAMUSCULAR | Status: AC
  Administered 2019-12-12: 2 mg via INTRAVENOUS

## 2019-12-12 MED ORDER — DIPHENHYDRAMINE HCL 50 MG/ML IJ SOLN
50.0000 mg | Freq: Once | INTRAMUSCULAR | Status: AC
  Administered 2019-12-12: 50 mg via INTRAVENOUS
  Filled 2019-12-12: qty 1

## 2019-12-12 MED ORDER — LACTATED RINGERS IV BOLUS (SEPSIS)
1000.0000 mL | Freq: Once | INTRAVENOUS | Status: AC
  Administered 2019-12-12: 1000 mL via INTRAVENOUS

## 2019-12-12 MED ORDER — THIAMINE HCL 100 MG PO TABS
100.0000 mg | ORAL_TABLET | Freq: Every day | ORAL | Status: DC
  Administered 2019-12-12 – 2019-12-18 (×7): 100 mg via ORAL
  Filled 2019-12-12 (×7): qty 1

## 2019-12-12 MED ORDER — DIPHENHYDRAMINE HCL 25 MG PO CAPS: 50.0000 mg | ORAL_CAPSULE | Freq: Once | ORAL | Status: AC

## 2019-12-12 MED ORDER — VITAMIN D (ERGOCALCIFEROL) 1.25 MG (50000 UNIT) PO CAPS
50000.0000 [IU] | ORAL_CAPSULE | ORAL | Status: DC
  Administered 2019-12-12: 50000 [IU] via ORAL
  Filled 2019-12-12: qty 1

## 2019-12-12 MED ORDER — VANCOMYCIN HCL 1500 MG/300ML IV SOLN
1500.0000 mg | Freq: Once | INTRAVENOUS | Status: AC
  Administered 2019-12-12: 1500 mg via INTRAVENOUS
  Filled 2019-12-12: qty 300

## 2019-12-12 MED ORDER — SODIUM BICARBONATE 8.4 % IV SOLN
INTRAVENOUS | Status: DC
  Filled 2019-12-12: qty 100

## 2019-12-12 MED ORDER — LACTATED RINGERS IV BOLUS
1000.0000 mL | Freq: Once | INTRAVENOUS | Status: AC
  Administered 2019-12-12: 1000 mL via INTRAVENOUS

## 2019-12-12 MED ORDER — METRONIDAZOLE IN NACL 5-0.79 MG/ML-% IV SOLN
500.0000 mg | Freq: Three times a day (TID) | INTRAVENOUS | Status: DC
  Administered 2019-12-12 – 2019-12-14 (×8): 500 mg via INTRAVENOUS
  Filled 2019-12-12 (×8): qty 100

## 2019-12-12 MED ORDER — METOPROLOL TARTRATE 5 MG/5ML IV SOLN
10.0000 mg | Freq: Three times a day (TID) | INTRAVENOUS | Status: DC
  Administered 2019-12-12 – 2019-12-15 (×5): 10 mg via INTRAVENOUS
  Filled 2019-12-12 (×8): qty 10

## 2019-12-12 MED ORDER — METOPROLOL TARTRATE 5 MG/5ML IV SOLN
2.5000 mg | Freq: Once | INTRAVENOUS | Status: AC
  Administered 2019-12-12: 2.5 mg via INTRAVENOUS
  Filled 2019-12-12: qty 5

## 2019-12-12 MED ORDER — ATORVASTATIN CALCIUM 80 MG PO TABS
80.0000 mg | ORAL_TABLET | Freq: Every day | ORAL | Status: DC
  Administered 2019-12-13 – 2019-12-17 (×5): 80 mg via ORAL
  Filled 2019-12-12 (×2): qty 1
  Filled 2019-12-12: qty 2
  Filled 2019-12-12 (×3): qty 1

## 2019-12-12 MED ORDER — SODIUM CHLORIDE 0.9 % IV SOLN
2.0000 g | INTRAVENOUS | Status: DC
  Administered 2019-12-12: 2 g via INTRAVENOUS
  Filled 2019-12-12 (×2): qty 20

## 2019-12-12 MED ORDER — BISMUTH SUBSALICYLATE 262 MG/15ML PO SUSP: 30.0000 mL | Freq: Four times a day (QID) | ORAL | Status: DC | PRN

## 2019-12-12 MED ORDER — SODIUM CHLORIDE 0.45 % IV SOLN: INTRAVENOUS | Status: DC

## 2019-12-12 MED ORDER — FUROSEMIDE 10 MG/ML IJ SOLN
INTRAMUSCULAR | Status: AC
  Filled 2019-12-12: qty 2

## 2019-12-12 MED ORDER — OXYCODONE HCL 5 MG PO TABS
5.0000 mg | ORAL_TABLET | ORAL | Status: DC | PRN
  Administered 2019-12-13 – 2019-12-18 (×2): 5 mg via ORAL
  Filled 2019-12-12 (×2): qty 1

## 2019-12-12 MED ORDER — DULOXETINE HCL 60 MG PO CPEP
60.0000 mg | ORAL_CAPSULE | Freq: Every day | ORAL | Status: DC
  Administered 2019-12-12 – 2019-12-18 (×7): 60 mg via ORAL
  Filled 2019-12-12 (×7): qty 1

## 2019-12-12 MED ORDER — MAGNESIUM SULFATE 2 GM/50ML IV SOLN
2.0000 g | Freq: Once | INTRAVENOUS | Status: AC
  Administered 2019-12-12: 2 g via INTRAVENOUS
  Filled 2019-12-12: qty 50

## 2019-12-12 MED ORDER — POTASSIUM CHLORIDE 10 MEQ/100ML IV SOLN
10.0000 meq | INTRAVENOUS | Status: AC
  Administered 2019-12-12 (×5): 10 meq via INTRAVENOUS
  Filled 2019-12-12 (×3): qty 100

## 2019-12-12 MED ORDER — PANTOPRAZOLE SODIUM 40 MG PO TBEC
40.0000 mg | DELAYED_RELEASE_TABLET | Freq: Two times a day (BID) | ORAL | Status: DC
  Administered 2019-12-12 (×2): 40 mg via ORAL
  Filled 2019-12-12 (×2): qty 1

## 2019-12-12 MED ORDER — PROCHLORPERAZINE EDISYLATE 10 MG/2ML IJ SOLN
5.0000 mg | Freq: Once | INTRAMUSCULAR | Status: AC
  Administered 2019-12-12: 5 mg via INTRAVENOUS
  Filled 2019-12-12: qty 2

## 2019-12-12 MED ORDER — LORAZEPAM 1 MG PO TABS: 1.0000 mg | ORAL_TABLET | Freq: Two times a day (BID) | ORAL | Status: DC

## 2019-12-12 MED ORDER — LORAZEPAM 2 MG/ML IJ SOLN
1.0000 mg | INTRAMUSCULAR | Status: DC | PRN
  Administered 2019-12-12: 3 mg via INTRAVENOUS
  Administered 2019-12-12: 2 mg via INTRAVENOUS
  Administered 2019-12-12: 3 mg via INTRAVENOUS
  Administered 2019-12-14: 1 mg via INTRAVENOUS
  Filled 2019-12-12: qty 2
  Filled 2019-12-12: qty 1
  Filled 2019-12-12: qty 2
  Filled 2019-12-12 (×2): qty 1

## 2019-12-12 MED ORDER — HYDROMORPHONE HCL 1 MG/ML IJ SOLN: 1.0000 mg | INTRAMUSCULAR | Status: DC | PRN

## 2019-12-12 MED ORDER — LORAZEPAM 1 MG PO TABS
1.0000 mg | ORAL_TABLET | ORAL | Status: DC | PRN
  Administered 2019-12-15: 1 mg via ORAL
  Filled 2019-12-12: qty 1

## 2019-12-12 MED ORDER — VANCOMYCIN HCL 1250 MG/250ML IV SOLN
1250.0000 mg | INTRAVENOUS | Status: DC
  Filled 2019-12-12: qty 250

## 2019-12-12 MED ORDER — FOLIC ACID 1 MG PO TABS
1.0000 mg | ORAL_TABLET | Freq: Every day | ORAL | Status: DC
  Administered 2019-12-12 – 2019-12-18 (×7): 1 mg via ORAL
  Filled 2019-12-12 (×7): qty 1

## 2019-12-12 MED ORDER — ENOXAPARIN SODIUM 40 MG/0.4ML ~~LOC~~ SOLN
40.0000 mg | SUBCUTANEOUS | Status: DC
  Administered 2019-12-12 – 2019-12-18 (×7): 40 mg via SUBCUTANEOUS
  Filled 2019-12-12 (×7): qty 0.4

## 2019-12-12 MED ORDER — ONDANSETRON HCL 4 MG PO TABS
4.0000 mg | ORAL_TABLET | Freq: Four times a day (QID) | ORAL | Status: DC | PRN
  Administered 2019-12-12: 4 mg via ORAL
  Filled 2019-12-12: qty 1

## 2019-12-12 MED ORDER — ACETAMINOPHEN 650 MG RE SUPP
650.0000 mg | Freq: Four times a day (QID) | RECTAL | Status: DC | PRN
  Administered 2019-12-12: 650 mg via RECTAL
  Filled 2019-12-12: qty 1

## 2019-12-12 MED ORDER — ONDANSETRON HCL 4 MG/2ML IJ SOLN
4.0000 mg | Freq: Four times a day (QID) | INTRAMUSCULAR | Status: DC | PRN
  Administered 2019-12-12: 4 mg via INTRAVENOUS
  Filled 2019-12-12: qty 2

## 2019-12-12 NOTE — Significant Event (Signed)
Rapid Response Event Note   Reason for Call :  Respiratory distress, Tachycardia Patient recently admitted from the ED  Initial Focused Assessment:  Patient with increased WOB using accessory muscles She is only able to say brief sounds, trying to catch her breath.  She uses more facial expressions to communicate.  She is delirious will follow a few commands and responds to her name. Her legs are mottled  Lung sounds with few crackles in bases  BP 120/79  ST 120 RR 40-50  O2 sat 90% on 4L [place] Rectal temp 103.8  Dr Benny Lennert at bedside to assess patient  Interventions:  ABG done PCXR done Inc O2 to 6L [place]  Tylenol given  20mg  Lasix given IV CIWA 14 , 2mg  Ativan given  She is a little more alert and interactive with staff Labs done 2nd IV placed Bicarb gtt started Zosyn & Vanc given Magnesium and Calcium gluconate given  CCM at bedside to assess patient Bicarb gtt stopped 1L LR bolus given Ativan per CIWA score Placed in safety belt/roll belt, mittens.  IV lines secured.  Plan of Care:  Treat sepsis CIWA protocol  Event Summary:   MD Notified: Dr Benny Lennert Call Time: [place] Time:  0623 End Time: 7628  Raliegh Ip, RN

## 2019-12-12 NOTE — Progress Notes (Signed)
Pt arrived from ED tachycardic, tachypneic, using accessory muscles to breathe, rectal temp 103.8, and delirious.  Rapid response was called to the bedside.  MD arrived to bedside shortly after pt's arrival.  New orders were received and implemented.  CHG bath and skin assessment completed.  Telemetry monitor applied and CCMD notified.  Will continue to monitor.

## 2019-12-12 NOTE — Progress Notes (Addendum)
CRITICAL VALUE ALERT  Critical Value:  6.3 Calcium  Date & Time Notied: 12/12/19 1600  Provider Notified: Dr. Karie Kirks, MD  Orders Received/Actions taken: see new orders

## 2019-12-12 NOTE — Consult Note (Signed)
NAME:  Nicole [name], MRN:  [id number], DOB:  [birth year], LOS: 0 ADMISSION DATE:  12/11/2019, CONSULTATION DATE:  10/3 REFERRING MD:  Dr. Benny Lennert, CHIEF COMPLAINT:  N/V/D  Brief History   74 y/o F admitted 10/2 with hx of N/V/D.    History of present illness   74 y/o F, never smoker, who presented to [facility] on 10/3 with reports of nausea, vomiting and diarrhea.    The patient lives alone and is normally independent of ADL's.  She lives approximately 10 minutes from her daughter.  Her daughter normally helps set out her medications in a dispenser but has not over the past two weeks.  The patient began having nausea / vomiting and diarrhea on 9/30.  She remained at home and was unable to take her medications (including her DAPT).  She reportedly fell two days prior to admit hitting her head.  She was not evaluated for the fall. After the fall, she developed N/V/D and was unable to take her medications.    Initial ER evaluation was notable for poorly coordinated movements, vertical nystagmus, tachycardia and tachypnea.  Labs notable for - CO2 20, AG 16, ammonia 55, troponin 218 > 377 > 721, Mg+ 0.2, Phos 4.1, K 3, WBC 20.3 and platelets.  CXR showed cardiomegaly and possible right hilar/RLL infiltrate.  CT of the head evaluated and negative. In the ER she was treated with IV ativan, 500 ml IV fluid bolus, IV lopressor, KCL PO.  CT of the abdomen was not completed as she has a contrast allergy.  Pre-medication was initiated but not completed. She was admitted per [facility] with working diagnosis of possible sepsis (fever to 103.8, WBC elevation), volume depletion with electrolyte disturbances and possible benzodiazepine withdrawal. Cardiology was consulted for evaluation of elevated troponin and possible ST changes.  After arrival to the progressive care unit, she had ongoing agitation and confusion. RRT called to bedside for assistance.  The patient was medicated with IV ativan with improvement in symptoms.  She  was given lasix per primary. ABG evaluation 7.35 / 34 / 82 / 18.  Beta-hydroxybuteric negative and no ketones in urine.     PCCM called for evaluation.    Her daughter reports she previously had similar symptoms after receiving benadryl during her last admission.   Past Medical History  CAD - s/p DES to OM1 10/2019 on DAPT NICM - LVEF 20-25% 10/2019 Hyperlipidemia  DM II  Mild Intermittent Asthma  COVID - 06/2019 Anxiety / Depression  Fibromyalgia  Anemia  GERD - with esophagitis, followed by Dr. Fuller Plan IBS Colon Polyps Obesity Urinary Incontinence  Expressive Aphasia - noted after LHC in 10/2019, CTA head/neck negative at that time, thought related to meds / possible benadryl  Significant Hospital Events   10/03 Admit with N/V/D  Consults:    Procedures:    Significant Diagnostic Tests:  CT Head 10/2 >> negative   Micro Data:  COVID 10/2 >> negative Influenza A/B 10/2 >> negative  BCx2 10/3 >>   Antimicrobials:  Flagyl 10/2 >>  Vanco 10/2 >>  Zosyn 10/2 >>   Interim history/subjective:  RRT RN reports patient confusion improved with ativan IVF initiated  Tmax 103.8  Objective   Blood pressure (!) 120/97, pulse (!) 126, temperature (!) 101.2 F (38.4 C), temperature source Oral, resp. rate (!) 34, height 5\' 2"  (1.575 m), weight 70.1 kg, SpO2 95 %.        Intake/Output Summary (Last 24 hours) at 12/12/2019 1755 Last  data filed at 12/11/2019 2255 Gross per 24 hour  Intake 500 ml  Output --  Net 500 ml   Filed Weights   12/11/19 2037  Weight: 70.1 kg    Examination: General: elderly adult female lying in bed in NAD  HEENT: MM pink/dry, crusting around lips, anicteric Neuro: patient moves all extremities, will answer questions appropriately to place, daughter at bedside name, events leading to admit, when not engaged she is easily distracted and begins picking at bed linens and will have an upward gaze with nystagmus CV: s1s2 RRR, tachycardic , no  m/r/g PULM: non-labored on  O2, lungs bilaterally clear GI: soft, bsx4 active  Extremities: warm/dry, no edema  Skin: no rashes or lesions  Resolved Hospital Problem list      Assessment & Plan:   Sepsis  Suspected sources include possible right hilar/RLL developing infiltrate (she is volume deplete, this may not "bloom" until adequately hydrated), abdominal source with N/V/D.  PCT negative.  -empiric abx as above  -follow cultures to maturity  -1L IVF now, she may need repeat bolus  -monitor for further diarrhea, she has not had any since admit -follow PCT -follow lactate trend  -assess stool for pathogens  -assess ABD [place]   CAD s/p DES (10/2019) HFrEF / ICM  Abnormal EKG  Recent DES to OM1, on DAPT, recommended for medical therapy (ASA + Brilinta).  Concern for possible ST changes with elevated troponin on admit. Cardiology felt that her EF was out of proportion to her CAD at that time.  -per Cardiology  -continue entresto  -ASA + Brilinta   Agitated Delirium  Possible Benzodiazepine Withdrawal  Possible Paradoxical Effect with Benadryl  Off home medications at least 72 hours -PRN ativan  -continue thiamine, MVI -hold further benadryl  -stop dilaudid -add home ativan back  AGMA  In setting of sepsis, volume depletion  -stop bicarbonate gtt  -IVF as above   Nausea / Vomiting / Diarrhea  Elevated Ammonia  -stool assessment as above -ABD [place]  -follow ammonia trend   Profound Hypomagnesemia  Hypokalemia  Hypocalcemia  -monitor, replace as indicated  -Ca+ replacement now  Intermittent Asthma  -continue Dulera  DM with Hyperglycemia  -glucose control per primary   Best practice:  Diet: as tolerated  Pain/Anxiety/Delirium protocol (if indicated): PRN ativan  VAP protocol (if indicated): n/a  DVT prophylaxis: lovenox  GI prophylaxis: PPI  Glucose control: per primary  Mobility: as tolerated  Code Status: Full Code  Family Communication: Daughter  updated at bedside 10/3 Disposition: PCU, per [facility]   Currently, the patient does not need to transfer to ICU.  Will monitor.   Labs   CBC: Recent Labs  Lab 12/11/19 1622 12/12/19 0736 12/12/19 1035  WBC 20.3* 17.6* 18.9*  NEUTROABS  --  14.7*  --   HGB 13.3 11.4* 12.0  HCT 39.4 34.1* 35.3*  MCV 88.7 90.0 90.7  PLT 331 254 622    Basic Metabolic Panel: Recent Labs  Lab 12/11/19 1622 12/12/19 0737 12/12/19 0902 12/12/19 1035 12/12/19 1507  NA 139 141  --  139 136  K 3.6 3.0*  --  3.0* 3.5  CL 105 106  --  103 103  CO2 20* 19*  --  21* 21*  GLUCOSE 254* 166*  --  192* 349*  BUN <5* 7*  --  8 7*  CREATININE 0.94 0.90  --  1.00 0.96  CALCIUM 7.3* 6.7*  --  6.7* 6.3*  MG  --  0.2* 0.2* 0.9* 3.8*  PHOS  --  4.1  --  3.9  --    GFR: Estimated Creatinine Clearance: 48.6 mL/min (by C-G formula based on SCr of 0.96 mg/dL). Recent Labs  Lab 12/11/19 1622 12/11/19 2117 12/12/19 0327 12/12/19 0524 12/12/19 0736 12/12/19 1035  PROCALCITON  --   --  <0.10  --   --   --   WBC 20.3*  --   --   --  17.6* 18.9*  LATICACIDVEN  --  3.4*  --  1.8  --   --     Liver Function Tests: Recent Labs  Lab 12/11/19 2117 12/12/19 1035 12/12/19 1507  AST 32 56* 60*  ALT 21 26 26   ALKPHOS 63 53 52  BILITOT 1.3* 1.1 1.0  PROT 7.2 6.1* 5.9*  ALBUMIN 3.6 3.1* 2.9*   No results for input(s): LIPASE, AMYLASE in the last 168 hours. Recent Labs  Lab 12/11/19 2117 12/12/19 0736  AMMONIA 55* 52*    ABG    Component Value Date/Time   PHART 7.357 12/12/2019 1430   PCO2ART 34.8 12/12/2019 1430   PO2ART 82.2 (L) 12/12/2019 1430   HCO3 18.3 (L) 12/12/2019 1430   TCO2 31 10/21/2019 0947   ACIDBASEDEF 5.6 (H) 12/12/2019 1430   O2SAT 91.1 12/12/2019 1430     Coagulation Profile: Recent Labs  Lab 12/12/19 0327  INR 1.1    Cardiac Enzymes: No results for input(s): CKTOTAL, CKMB, CKMBINDEX, TROPONINI in the last 168 hours.  HbA1C: Hgb A1c MFr Bld  Date/Time Value Ref Range  Status  10/16/2019 11:26 PM 6.5 (H) 4.8 - 5.6 % Final    Comment:    (NOTE) Pre diabetes:          5.7%-6.4%  Diabetes:              >6.4%  Glycemic control for   <7.0% adults with diabetes   06/24/2019 10:37 PM 6.5 (H) 4.8 - 5.6 % Final    Comment:    (NOTE) Pre diabetes:          5.7%-6.4% Diabetes:              >6.4% Glycemic control for   <7.0% adults with diabetes     CBG: Recent Labs  Lab 12/12/19 1506  GLUCAP 191*    Review of Systems:   Gen: Denies fever, chills, weight change, fatigue, night sweats HEENT: Denies blurred vision, double vision, hearing loss, tinnitus, sinus congestion, rhinorrhea, sore throat, neck stiffness, dysphagia PULM: Denies shortness of breath, cough, sputum production, hemoptysis, wheezing CV: Denies chest pain, edema, orthopnea, paroxysmal nocturnal dyspnea, palpitations GI: Denies abdominal pain, nausea, vomiting, diarrhea, hematochezia, melena, constipation, change in bowel habits GU: Denies dysuria, hematuria, polyuria, oliguria, urethral discharge Endocrine: Denies hot or cold intolerance, polyuria, polyphagia or appetite change Derm: Denies rash, dry skin, scaling or peeling skin change Heme: Denies easy bruising, bleeding, bleeding gums Neuro: Denies headache, numbness, weakness, slurred speech, loss of memory or consciousness   Past Medical History  She,  has a past medical history of Anemia, Anxiety, Back pain, Blurred vision, CAD (coronary artery disease) (10/22/2019), Colon polyps, COVID-19, Endometriosis, Fibromyalgia, Forgetfulness, GERD with esophagitis, IBS (irritable bowel syndrome), Joint pain, Major depressive disorder, Migraine, Mild intermittent asthma, Mixed diabetic hyperlipidemia associated with type 2 diabetes mellitus ([place]), Obesity, Type 2 diabetes mellitus ([place]), Urinary incontinence, and Vaginitis.   Surgical History    Past Surgical History:  Procedure Laterality Date  . ABDOMINAL HYSTERECTOMY  TAH BSO   . APPENDECTOMY  1999  . CATARACT EXTRACTION     both  . CHOLECYSTECTOMY    . CORONARY STENT INTERVENTION N/A 10/21/2019   Procedure: CORONARY STENT INTERVENTION;  Surgeon: Troy Sine, MD;  Location: [facility];  Service: Cardiovascular;  Laterality: N/A;  . DENTAL SURGERY    . HERNIA REPAIR  2000  . LAPAROSCOPIC ENDOMETRIOSIS FULGURATION    . RIGHT/LEFT HEART CATH AND CORONARY ANGIOGRAPHY N/A 10/21/2019   Procedure: RIGHT/LEFT HEART CATH AND CORONARY ANGIOGRAPHY;  Surgeon: Troy Sine, MD;  Location: [facility];  Service: Cardiovascular;  Laterality: N/A;     Social History   reports that she has never smoked. She has never used smokeless tobacco. She reports current alcohol use. She reports that she does not use drugs.   Family History   Her family history includes Cancer in her brother, brother, paternal aunt, and paternal uncle; Diabetes in her daughter, father, and mother; Heart attack in her father; Heart failure in her father and mother; Hypertension in her father, maternal grandfather, maternal grandmother, mother, paternal grandfather, and paternal grandmother; Stroke in her father; Thyroid disease in her brother. There is no history of Colon cancer or Stomach cancer.   Allergies Allergies  Allergen Reactions  . Fish-Derived Products Anaphylaxis  . Shellfish Allergy Shortness Of Breath    In dye for scan.  . Aspirin Nausea Only and Other (See Comments)    Pt stated it only gives her problems when she takes the high dose (325 mg)  . Ivp Dye [Iodinated Diagnostic Agents]     Hx of ? anaphlaxis  . Latex Other (See Comments)    Pt says she gets a "stinky infection"     Home Medications  Prior to Admission medications   Medication Sig Start Date End Date Taking? Authorizing Provider  Ascorbic Acid (VITAMIN C PO) Take 1 tablet by mouth daily.   Yes [provider]  aspirin 81 MG tablet Take 81 mg by mouth daily.   Yes [provider]   atorvastatin (LIPITOR) 80 MG tablet Take 1 tablet (80 mg total) by mouth at bedtime. 11/19/19 12/19/19 Yes Martinique, Peter M, MD  bismuth subsalicylate (PEPTO BISMOL) 262 MG/15ML suspension Take 30 mLs by mouth every 6 (six) hours as needed for indigestion.   Yes [provider]  DULoxetine (CYMBALTA) 60 MG capsule Take 60 mg by mouth daily. 08/10/19  Yes [provider]  ergocalciferol (VITAMIN D2) 50000 UNITS capsule Take 50,000 Units by mouth once a week.     Yes [provider]  fluticasone (FLONASE) 50 MCG/ACT nasal spray Place 1 spray into both nostrils daily as needed for allergies or rhinitis.  05/28/19  Yes [provider]  Fluticasone-Salmeterol (ADVAIR DISKUS) 250-50 MCG/DOSE AEPB Inhale 1 puff into the lungs 2 (two) times daily.   Yes [provider]  loratadine (CLARITIN) 10 MG tablet Take 1 tablet (10 mg total) by mouth daily. 06/28/19  Yes Ghimire, Henreitta Leber, MD  LORazepam (ATIVAN) 1 MG tablet Take 1 mg by mouth 2 (two) times daily.    Yes [provider]  metoprolol succinate (TOPROL-XL) 25 MG 24 hr tablet Take 1 tablet (25 mg total) by mouth daily. 11/19/19 12/19/19 Yes Martinique, Peter M, MD  NONFORMULARY OR COMPOUNDED ITEM Estradiol 0.02% vaginal cream prefilled applicators apply one twice weekly 05/23/15  Yes Fontaine, Belinda Block, MD  ondansetron (ZOFRAN) 4 MG tablet Take 4 mg by mouth every  8 (eight) hours as needed for nausea or vomiting.  11/22/19  Yes [provider]  oxyCODONE-acetaminophen (PERCOCET) 10-325 MG per tablet Take 1 tablet by mouth 2 (two) times daily as needed for pain.    Yes [provider]  pantoprazole (PROTONIX) 40 MG tablet Take 1 tablet (40 mg total) by mouth 2 (two) times daily. 08/14/11  Yes Ladene Artist, MD  potassium chloride SA (KLOR-CON) 20 MEQ tablet Take 2 tablets (40 mEq total) by mouth daily. 11/19/19 12/19/19 Yes Martinique, Peter M, MD  PROAIR HFA 108 [phone] Base) MCG/ACT inhaler Inhale 2  puffs into the lungs every 4 (four) hours as needed for wheezing or shortness of breath. Uses as rescue inhaler Reported on 06/20/2015 Patient taking differently: Inhale 2 puffs into the lungs every 4 (four) hours as needed for wheezing or shortness of breath. Uses as rescue inhaler Reported on 10/16/2019 06/28/19  Yes Ghimire, Henreitta Leber, MD  sacubitril-valsartan (ENTRESTO) 24-26 MG Take 1 tablet by mouth 2 (two) times daily. 11/19/19 12/19/19 Yes [place]ique, Peter M, MD  ticagrelor (BRILINTA) 90 MG TABS tablet Take 1 tablet (90 mg total) by mouth 2 (two) times daily. 11/19/19 12/19/19 Yes Martinique, Peter M, MD  [facility] DELICA LANCETS [address] [place]  06/03/16   [provider]  Roma Schanz test strip  06/03/16   [provider]     Critical care time:     Noe Gens, MSN, NP-C, AGACNP-BC [facility] 12/12/2019, 6:48 PM   Please see [email or web address] for pager details.

## 2019-12-12 NOTE — Progress Notes (Signed)
PROGRESS NOTE  Nicole [name] RAQ:[id number] DOB: February 08, 1946 DOA: 12/11/2019 PCP: [name], MD  Brief History   Nicole [name] is a 74 y.o. female with medical history significant of anemia, anxiety, depression, chronic back pain, bloating, occasional blurred vision, colon polyps, dizziness, history endometriosis, fatigue, fibromyalgia, memory difficulties, GERD, headache/migraine, arthralgias, lower extremity weakness, mild intermittent asthma, hyperlipidemia, overweight, type II DM, urinary incontinence, vaginitis who was brought to the emergency department by her daughter due to nausea, vomiting, diarrhea and dizziness since Thursday.  She denies melena or hematochezia.  No dysuria, frequency or hematuria.  She also complains of substernal/epigastric discomfort and palpitations.  She denies PND, orthopnea or recent pitting edema of the lower extremities.  No fever, chills, but stated she had some night sweats.  She denies dyspnea, productive cough, wheezing or hemoptysis.  She denies polyuria, polydipsia, polyphagia or blurred vision.  The patient also has not taken her medications in the last 2+ days.  She mentioned that she last took them on Thursday morning.  ED Course: Initial vital signs were temperature EMS gave the patient 500 mL of NS bolus and 4 mg of ondansetron IVP.  CBC shows a white count 20.3, hemoglobin 13.3 g/dL and platelets 331.Troponin was 218 and then 377 ng/L.  LFTs show a mildly increased bilirubin at 1.3 mg/dL, but were otherwise normal. BMP shows normal sodium, potassium, chloride and renal function.  CO2 was 20 mmol/L.  Her anion gap was 14.  Glucose is 254 and calcium 7.3 mg/dL.Ammonia level was 55.  TSH was normal.  Lactic acid 3.4 mmol/L.  D-dimer was 0.77.  Initially, her chest radiograph did not have any acute abnormality.  There was no effusion and cardiomediastinal contours are stable.  CT head and MRI of brain did not show any acute intracranial abnormality.     On the morning of 12/12/2019 the patient was tchypneic in the 20's, HR was elevated in the 120's. She was requiring 2L O2 to maintain SaO2 in the mid nineties.  Overnight the patient's elevated troponin increased some more. EKG was repeated and demonstrated lateral ST segment depressions.   She also had become agitated.   Due to her nausea and vomiting the patient had not taken her medications for at least 3 days. The patient is on DAPT for a stent placed in August. She is also on lorazepam which she takes twice a day.   Cardiology was consulted due to abnormal EKG, elevated troponins, and history of DAPT for stent placed in august. I appreciate their help.  The patient has been placed on a CIWA protocol out of concern for benzodiazepine withdrawal.  She also has severe hypomagnesemia as well as hypokalemia. These are being supplemented and followed.  She also has a metabolic acidosis. Beta hydroxy butyric acid is negative.  ABG as well as repeat chemistry and CXR has been ordered.  She has developed a fever of 103. Blood cultures x 2 have been ordered. She has been started on zosyn and vancomycin. PCR for MRSA has been ordered. CXR demonstrates some pulmonary edema. She will get a low dose of lasix for that. She is getting sodium bicarbonate for metabolic acidosis.  ABG demonstrates pH of 7.357, PCO2 of 34.8, PaO2 of 82.2, and SaO2 of 91.1. Bicarbonate of 18.3. This was obtained on 6L.  Consultants  . Cardiology  Procedures  . None  Antibiotics   Anti-infectives (From admission, onward)   Start     Dose/Rate Route Frequency Ordered Stop  12/12/19 1445  piperacillin-tazobactam (ZOSYN) IVPB 3.375 g        3.375 g 12.5 mL/hr over 240 Minutes Intravenous Every 8 hours 12/12/19 1438     12/12/19 0330  cefTRIAXone (ROCEPHIN) 2 g in sodium chloride 0.9 % 100 mL IVPB        2 g 200 mL/hr over 30 Minutes Intravenous Every 24 hours 12/12/19 0329     12/12/19 0330  metroNIDAZOLE  (FLAGYL) IVPB 500 mg        500 mg 100 mL/hr over 60 Minutes Intravenous Every 8 hours 12/12/19 0329      .  Subjective  The patient is delirious. She is awake and able to follow a few commands. She appears acutely ill. Her respirations are rapid and shallow.   Objective   Vitals:  Vitals:   12/12/19 1400 12/12/19 1413  BP:    Pulse:    Resp: (!) 40   Temp: 99.7 F (37.6 C) (!) 103.8 F (39.9 C)  SpO2:     Exam:  Constitutional:  . The patient is awake, but lethargic and confused. She is in moderate distress. Respiratory:  . Positive for increased work of breathing. Shallow respirations. . No wheezes, rales, or rhonchi . No tactile fremitus Cardiovascular:  . Tachycardic with rate in 120's.  . No murmurs, ectopy, or gallups. . No lateral PMI. No thrills. Abdomen:  . Abdomen is soft, non-tender, non-distended . No hernias, masses, or organomegaly . Normoactive bowel sounds.  Musculoskeletal:  . No cyanosis, clubbing, or edema Skin:  . No rashes, lesions, ulcers . palpation of skin: no induration or nodules Neurologic:  Patient is confused and lethargic. She is not able to participate in evaluation. Psychiatric:  . Patient is confused and lethargic. She is not able to participate in evaluation.  I have personally reviewed the following:   Today's Data  . Vitals, Troponins, CMP, CBC, Beta hydroxybutyric acid  Micro Data  . Blood cultures x 2  Imaging  . CXR: Mild pulmonary edema  Cardiology Data  . EKG, x 3.  Scheduled Meds: . aspirin  81 mg Oral Daily  . atorvastatin  80 mg Oral QHS  . DULoxetine  60 mg Oral Daily  . enoxaparin (LOVENOX) injection  40 mg Subcutaneous Q24H  . folic acid  1 mg Oral Daily  . furosemide  20 mg Intravenous Once  . levalbuterol  1.25 mg Nebulization Q6H  . loratadine  10 mg Oral Daily  . LORazepam  0-4 mg Intravenous Q6H   Followed by  . [START ON 12/14/2019] LORazepam  0-4 mg Intravenous Q12H  . LORazepam  1 mg  Intravenous Once  . LORazepam  0-4 mg Oral Q6H   Followed by  . [START ON 12/14/2019] LORazepam  0-4 mg Oral Q12H  . LORazepam  1 mg Oral BID  . metoprolol tartrate  10 mg Intravenous Q8H  . mometasone-formoterol  2 puff Inhalation BID  . multivitamin with minerals  1 tablet Oral Daily  . pantoprazole  40 mg Oral BID  . potassium chloride SA  40 mEq Oral BID  . predniSONE  50 mg Oral Q6H  . sacubitril-valsartan  1 tablet Oral BID  . thiamine  100 mg Oral Daily   Or  . thiamine  100 mg Intravenous Daily  . ticagrelor  90 mg Oral BID  . Vitamin D (Ergocalciferol)  50,000 Units Oral Q Sun   Continuous Infusions: . cefTRIAXone (ROCEPHIN)  IV Stopped (12/12/19 6578)  .  magnesium sulfate bolus IVPB    . metronidazole Stopped (12/12/19 1306)  . sodium bicarbonate in D5W 1000 mL infusion      Principal Problem:   Sepsis due to undetermined organism [employer]) Active Problems:   Anxiety   Depression   GERD with esophagitis   Mixed diabetic hyperlipidemia associated with type 2 diabetes mellitus (HCC)   Elevated troponin   CAD S/P percutaneous coronary angioplasty   Nystagmus   Chronic combined systolic and diastolic congestive heart failure (HCC)   Type 2 diabetes mellitus (HCC)   Hyperammonemia (HCC)   Chest pain in adult   LOS: 0 days   A & P  Sepsis: the patient has developed fever, tachycardia, tachypnea, worsening leukocytosis, altered mental status and hypoxia. She has been given 20 mg lasix due to pulmonary edema and increased oxygen requirements. She has received treatment for her fevers. She has been started on zosyn and vancomycin. Blood cultures pending.  Metabolic acidosis: Due to sepsis: Sodium bicarbonate infusion started. Monitor.  Electrolyte abnormalities: Mg this am was less than 0.2. This was supplemented with IV magnesium sulfate. Potassium was also low at 3.4. This was supplemented. Repeat magnesium was 1.1 and repeat potassium was 3.0. Both were again  supplemented. Monitor closely.  The patient complained of epigastric/substernal chest discomfort. Her troponins were elevated at 218 upon presentation. This was increased to 377 5 hours later and then 721 at 0330 am. This was repeated later in the morning and troponin had decreased to 694. EKG demonstrated tachycardia and lateral ischemia that was new. Cardiology was consulted.  CAD: The patient had stent placed in the first week of August 2021. Due to her nausea/vomiting/and diarrhea she has not taken any of her medications for the past 3-4 days. This includes her DAPT. This has been restarted along with a heparin drip by cardiology. She may require LHC prior to discharge.  Prolonged QT: Likely related to the patient's acidosis and electrolyte disturbances. Avoid QT prolonging medications. Follow EKG.  Acute on chronic combined systolic and diastolic heart failure: Per echocardiogram performed in 10/2019 the patient has an EF of 20-25%. This was associated with global hypokinessi of the left ventricle and mild dilatation of the LV. There was also grade II diastolic dysfunction present. Mild pulmonary edema on CXR.  Nausea, vomiting, abdominal pain: The patient had demonstrated epigastric discomfort but no nausea or vomiting since her presentation to [facility]. There has also been no diarrhea. An attempt was made to further elucidate the causes for her complaints with a CT of the abdomen and pelvis with contrast. The patient is refusing CT as well as prophylaxis for IV contrast dye. Stool will be sent for GI pathogen panel.  Delirium: Due to sepsis in the setting of benzodiazepine withdrawal. The patient is on a CIWA protocol for possible benzodiazepine withdrawal.  I have seen and examined this patient myself. I have spent 94 minutes in the care of this patient.  DVT prophylaxis: Enoxaparin CODE STATUIS: Full Code Family Communication: Daughter is at bedside Disposition: The patient has been  transferred from the ED to the progressive unit Status is: Inpatient  Remains inpatient appropriate because:IV treatments appropriate due to intensity of illness or inability to take PO   Dispo: The patient is from: Home              Anticipated d/c is to: Home              Anticipated d/c date is: > 3 days  Patient currently is not medically stable to d/c.   Severity of Illness: The appropriate patient status for this patient is INPATIENT. Inpatient status is judged to be reasonable and necessary in order to provide the required intensity of service to ensure the patient's safety. The patient's presenting symptoms, physical exam findings, and initial radiographic and laboratory data in the context of their chronic comorbidities is felt to place them at high risk for further clinical deterioration. Furthermore, it is not anticipated that the patient will be medically stable for discharge from the hospital within 2 midnights of admission. The following factors support the patient status of inpatient.   " The patient's presenting symptoms include delirium. " The worrisome physical exam findings include Abdominal pain. " The initial radiographic and laboratory data are worrisome because of Normal appearance of abdomen on x-ray. . " The chronic co-morbidities include CAD.   * I certify that at the point of admission it is my clinical judgment that the patient will require inpatient hospital care spanning beyond 2 midnights from the point of admission due to high intensity of service, high risk for further deterioration and high frequency of surveillance required.*  Chaunda Vandergriff, DO [facility] Direct contact: see [email or web address]  7PM-7AM contact night coverage as above 12/12/2019, 2:33 PM  LOS: 0 days

## 2019-12-12 NOTE — ED Notes (Signed)
Tele  Breakfast Ordered 

## 2019-12-12 NOTE — ED Notes (Signed)
MD paged to report critical Magnesium 0.2

## 2019-12-12 NOTE — Progress Notes (Signed)
Pharmacy Antibiotic Note  Nicole [name] is a 74 y.o. female admitted on 12/11/2019 with sepsis.  Pharmacy has been consulted for Vancomycin dosing.  Plan: Vancomycin 1500mg  once followed by Vancomycin 1250 IV every 24 hours.  Goal trough 15-20 mcg/mL. Follow VT as needed Follow cultures and clinical status  Height: 5\' 2"  (157.5 cm) Weight: 70.1 kg (154 lb 8.7 oz) IBW/kg (Calculated) : 50.1  Temp (24hrs), Avg:99.9 F (37.7 C), Min:98 F (36.7 C), Max:103.8 F (39.9 C)  Recent Labs  Lab 12/11/19 1622 12/11/19 2117 12/12/19 0524 12/12/19 0736 12/12/19 0737 12/12/19 1035  WBC 20.3*  --   --  17.6*  --  18.9*  CREATININE 0.94  --   --   --  0.90 1.00  LATICACIDVEN  --  3.4* 1.8  --   --   --     Estimated Creatinine Clearance: 46.6 mL/min (by C-G formula based on SCr of 1 mg/dL).    Allergies  Allergen Reactions  . Fish-Derived Products Anaphylaxis  . Shellfish Allergy Shortness Of Breath    In dye for scan.  . Aspirin Nausea Only and Other (See Comments)    Pt stated it only gives her problems when she takes the high dose (325 mg)  . Ivp Dye [Iodinated Diagnostic Agents]     Hx of ? anaphlaxis  . Latex Other (See Comments)    Pt says she gets a "stinky infection"    Antimicrobials this admission: 10/3 CTX x1 10/3 flagyl >> 10/3 vanc >> 10/3 zosyn >>  Microbiology results: 10/3 BCx: sent 10/2 Covid negative  Thank you for allowing pharmacy to be a part of this patient's care.  Norina Buzzard, PharmD PGY1 Pharmacy Resident 12/12/2019 2:52 PM

## 2019-12-12 NOTE — Consult Note (Signed)
Cardiology Consultation:   Patient ID: Nicole [name]; [id number]; 1945-12-03   Admit date: 12/11/2019 Date of Consult: 12/12/2019  Primary Care Provider: [name], MD Primary Cardiologist: Peter Martinique, MD Primary Electrophysiologist: None   Patient Profile:   Nicole [name] is a 74 y.o. female with a history of type 2 diabetes mellitus, hyperlipidemia, IBS, COVID-19 in April 2021 and CAD status post DES to the [place] in August 2021 who is being seen today for the evaluation of abnormal ECG and cardiac enzymes at the request of Dr. Benny Lennert.  History of Present Illness:   Nicole [name] presents to the [facility], ER describing recurrent emesis, epigastric discomfort, and loose stools since this past Thursday.  She has not been able to eat at all other than some soup yesterday, also not on her regular medications for the last few days.  She does not report any obvious fevers, has felt somewhat shaky otherwise.  No sick contacts, she states that she feels like she has a "stomach bug."  She does have a history of IBS.  History is reviewed below.  She had COVID-19 back in April.  More recently she was diagnosed with a presumed nonischemic cardiomyopathy with LVEF 20 to 25% in August.  She did have branch vessel CAD documented at that time and underwent DES intervention to the OM1, but degree of CAD was not proportionally consistent with her cardiomyopathy.  Medical therapy was recommended including aspirin and Brilinta.  She does not describe any frank chest discomfort.  Work-up finds significant hypokalemia and hypomagnesemia, she is tachycardic with abnormal ECG showing diffuse ST-T wave abnormalities.  Chest x-ray shows interstitial edema.  She is afebrile but has a significant leukocytosis with WBC 18.9.  She is being admitted to the hospitalist service for further evaluation.  Past Medical History:  Diagnosis Date  . Anemia   . Anxiety   . Back pain   . Blurred vision    occasional    . CAD (coronary artery disease) 10/22/2019   DES to [facility] August 2021  . Colon polyps   . Endometriosis   . Fibromyalgia   . Forgetfulness   . GERD with esophagitis    Follows with Dr. Fuller Plan with GI, EGD in the past  . IBS (irritable bowel syndrome)   . Joint pain   . Major depressive disorder   . Migraine   . Mild intermittent asthma   . Mixed diabetic hyperlipidemia associated with type 2 diabetes mellitus ([place])   . Obesity   . Type 2 diabetes mellitus ([place])   . Urinary incontinence   . Vaginitis     Past Surgical History:  Procedure Laterality Date  . ABDOMINAL HYSTERECTOMY     TAH BSO  . APPENDECTOMY  1999  . CATARACT EXTRACTION     both  . CHOLECYSTECTOMY    . CORONARY STENT INTERVENTION N/A 10/21/2019   Procedure: CORONARY STENT INTERVENTION;  Surgeon: Troy Sine, MD;  Location: [facility];  Service: Cardiovascular;  Laterality: N/A;  . DENTAL SURGERY    . HERNIA REPAIR  2000  . LAPAROSCOPIC ENDOMETRIOSIS FULGURATION    . RIGHT/LEFT HEART CATH AND CORONARY ANGIOGRAPHY N/A 10/21/2019   Procedure: RIGHT/LEFT HEART CATH AND CORONARY ANGIOGRAPHY;  Surgeon: Troy Sine, MD;  Location: [facility];  Service: Cardiovascular;  Laterality: N/A;     Inpatient Medications: Scheduled Meds: . aspirin  81 mg Oral Daily  . atorvastatin  80 mg Oral QHS  .  diphenhydrAMINE  50 mg Oral Once   Or  . diphenhydrAMINE  50 mg Intravenous Once  . DULoxetine  60 mg Oral Daily  . enoxaparin (LOVENOX) injection  40 mg Subcutaneous Q24H  . folic acid  1 mg Oral Daily  . levalbuterol  1.25 mg Nebulization Q6H  . loratadine  10 mg Oral Daily  . LORazepam  1 mg Intravenous Once  . LORazepam  0-4 mg Oral Q6H   Followed by  . [START ON 12/14/2019] LORazepam  0-4 mg Oral Q12H  . LORazepam  1 mg Oral BID  . metoprolol tartrate  10 mg Intravenous Q8H  . mometasone-formoterol  2 puff Inhalation BID  . multivitamin with minerals  1 tablet Oral Daily  . pantoprazole  40 mg  Oral BID  . potassium chloride SA  40 mEq Oral BID  . predniSONE  50 mg Oral Q6H  . sacubitril-valsartan  1 tablet Oral BID  . thiamine  100 mg Oral Daily   Or  . thiamine  100 mg Intravenous Daily  . ticagrelor  90 mg Oral BID  . Vitamin D (Ergocalciferol)  50,000 Units Oral Q Sun   Continuous Infusions: . sodium chloride 50 mL/hr at 12/12/19 0608  . cefTRIAXone (ROCEPHIN)  IV Stopped (12/12/19 9381)  . metronidazole Stopped (12/12/19 0603)  . potassium chloride 10 mEq (12/12/19 1046)   PRN Meds: acetaminophen **OR** acetaminophen, albuterol, bismuth subsalicylate, fluticasone, LORazepam **OR** LORazepam, morphine injection, ondansetron **OR** ondansetron (ZOFRAN) IV, oxyCODONE  Allergies:    Allergies  Allergen Reactions  . Fish-Derived Products Anaphylaxis  . Shellfish Allergy Shortness Of Breath    In dye for scan.  . Aspirin Nausea Only and Other (See Comments)    Pt stated it only gives her problems when she takes the high dose (325 mg)  . Ivp Dye [Iodinated Diagnostic Agents]     Hx of ? anaphlaxis  . Latex Other (See Comments)    Pt says she gets a "stinky infection"    Social History:   Social History   Tobacco Use  . Smoking status: Never Smoker  . Smokeless tobacco: Never Used  Substance Use Topics  . Alcohol use: Yes    Alcohol/week: 0.0 standard drinks    Comment: occasional    Family History:   The patient's family history includes Cancer in her brother, brother, paternal aunt, and paternal uncle; Diabetes in her daughter, father, and mother; Heart attack in her father; Heart failure in her father and mother; Hypertension in her father, maternal grandfather, maternal grandmother, mother, paternal grandfather, and paternal grandmother; Stroke in her father; Thyroid disease in her brother. There is no history of Colon cancer or Stomach cancer.  ROS:  Please see the history of present illness.  All other ROS reviewed and negative.     Physical Exam/Data:    Vitals:   12/12/19 0930 12/12/19 1028 12/12/19 1030 12/12/19 1045  BP: 127/82 (!) 140/105 121/79 113/77  Pulse: (!) 118 (!) 121 (!) 111 (!) 113  Resp: (!) 34  (!) 45 (!) 38  Temp:      TempSrc:      SpO2: 90%  (!) 82% 95%  Weight:      Height:        Intake/Output Summary (Last 24 hours) at 12/12/2019 1122 Last data filed at 12/11/2019 2255 Gross per 24 hour  Intake 500 ml  Output --  Net 500 ml   Filed Weights   12/11/19 2037  Weight: 70.1 kg  Body mass index is 28.27 kg/m.   Gen: Patient appears comfortable at rest. HEENT: Conjunctiva and lids normal, oropharynx clear with moist mucosa. Neck: Supple, no elevated JVP or carotid bruits, no thyromegaly. Lungs: Clear to auscultation, nonlabored breathing at rest. Cardiac: Regular rate and rhythm, no S3 or significant systolic murmur, no pericardial rub. Abdomen: Soft, nontender, no hepatomegaly, bowel sounds present, no guarding or rebound. Extremities: No pitting edema, distal pulses 2+. Skin: Warm and dry. Musculoskeletal: No kyphosis. Neuropsychiatric: Alert and oriented x3, affect grossly appropriate.  EKG:  An ECG dated 12/12/2019 was personally reviewed today and demonstrated:  Sinus tachycardia with diffuse ST-T wave abnormalities, significant T wave inversions anterolaterally that are new.  Telemetry:  I personally reviewed telemetry which shows sinus tachycardia.  Relevant CV Studies:  Cardiac catheterization 10/21/2019:  1st Mrg lesion is 95% stenosed.  Post intervention, there is a 0% residual stenosis.  Prox RCA to Mid RCA lesion is 20% stenosed.  Prox LAD lesion is 20% stenosed.  Mid LAD lesion is 40% stenosed.  A stent was successfully placed.   Coronary obstructive disease with 20% proximal and 40% smooth mid LAD stenoses; 30% proximal circumflex stenosis with 95% near ostial stenosis in a bifurcating OM1 vessel; and 20% mid RCA narrowing.  Normal to minimally increased right heart pressures;  mean PA pressure 21 mmHg  Successful PCI to 95% very proximal circumflex marginal stenosis with insertion of a 2.25 x 8 mm DES stent with the stenosis being reduced to 0%.  RECOMMENDATION: Recommend DAPT for 12 months.  Guideline directed medical therapy for the patient's significant cardiomyopathy which is out of proportion to her high-grade circumflex marginal stenosis. Aggressive lipid-lowering therapy with target LDL less than 70.  Echocardiogram 10/17/2019: 1. Left ventricular ejection fraction, by estimation, is 20 to 25%. The  left ventricle has severely decreased function. The left ventricle  demonstrates global hypokinesis. The left ventricular internal cavity size  was mildly dilated. Left ventricular  diastolic parameters are consistent with Grade II diastolic dysfunction  (pseudonormalization). Elevated left ventricular end-diastolic pressure.  2. Right ventricular systolic function is normal. The right ventricular  size is normal. There is normal pulmonary artery systolic pressure. The  estimated right ventricular systolic pressure is 16.1 mmHg.  3. Left atrial size was severely dilated.  4. The mitral valve is normal in structure. Mild mitral valve  regurgitation. No evidence of mitral stenosis.  5. The aortic valve is tricuspid. Aortic valve regurgitation is trivial.  Mild to moderate aortic valve sclerosis/calcification is present, without  any evidence of aortic stenosis.  6. The inferior vena cava is normal in size with greater than 50%  respiratory variability, suggesting right atrial pressure of 3 mmHg.  7. There is right bowing of the interatrial septum, suggestive of  elevated left atrial pressure. No atrial level shunt detected by color  flow Doppler.   Laboratory Data:  Chemistry Recent Labs  Lab 12/11/19 1622 12/12/19 0737  NA 139 141  K 3.6 3.0*  CL 105 106  CO2 20* 19*  GLUCOSE 254* 166*  BUN <5* 7*  CREATININE 0.94 0.90  CALCIUM 7.3* 6.7*    GFRNONAA >60 >60  GFRAA >60 >60  ANIONGAP 14 16*    Recent Labs  Lab 12/11/19 2117  PROT 7.2  ALBUMIN 3.6  AST 32  ALT 21  ALKPHOS 63  BILITOT 1.3*   Hematology Recent Labs  Lab 12/11/19 1622 12/12/19 0736  WBC 20.3* 17.6*  RBC 4.44 3.79*  HGB 13.3  11.4*  HCT 39.4 34.1*  MCV 88.7 90.0  MCH 30.0 30.1  MCHC 33.8 33.4  RDW 13.8 13.9  PLT 331 254   Cardiac Enzymes Recent Labs  Lab 12/11/19 1622 12/11/19 2117 12/12/19 0327 12/12/19 0902  TROPONINIHS 218* 377* 721* 694*    DDimer Recent Labs  Lab 12/11/19 2117  DDIMER 0.77*    Radiology/Studies:  DG Chest 2 View  Result Date: 12/11/2019 CLINICAL DATA:  Shortness of breath. EXAM: CHEST - 2 VIEW COMPARISON:  October 16, 2019. FINDINGS: Stable cardiomediastinal silhouette. No pneumothorax or pleural effusion is noted. Both lungs are clear. The visualized skeletal structures are unremarkable. IMPRESSION: No active cardiopulmonary disease. Electronically Signed   By: Marijo Conception M.D.   On: 12/11/2019 16:54   CT Head Wo Contrast  Result Date: 12/11/2019 CLINICAL DATA:  Nausea vomiting dizziness EXAM: CT HEAD WITHOUT CONTRAST TECHNIQUE: Contiguous axial images were obtained from the base of the skull through the vertex without intravenous contrast. COMPARISON:  October 21, 2019 FINDINGS: Brain: No evidence of acute territorial infarction, hemorrhage, hydrocephalus,extra-axial collection or mass lesion/mass effect. There is dilatation the ventricles and sulci consistent with age-related atrophy. Low-attenuation changes in the deep white matter consistent with small vessel ischemia. Vascular: No hyperdense vessel or unexpected calcification. Skull: The skull is intact. No fracture or focal lesion identified. Sinuses/Orbits: The visualized paranasal sinuses and mastoid air cells are clear. The orbits and globes intact. Other: None IMPRESSION: No acute intracranial abnormality. Findings consistent with age related atrophy and  chronic small vessel ischemia Electronically Signed   By: Prudencio Pair M.D.   On: 12/11/2019 21:49   MR BRAIN WO CONTRAST  Result Date: 12/12/2019 CLINICAL DATA:  [place] EXAM: MRI HEAD WITHOUT CONTRAST TECHNIQUE: Multiplanar, multiecho pulse sequences of the brain and surrounding structures were obtained without intravenous contrast. COMPARISON:  None. FINDINGS: Brain: No acute infarct, acute hemorrhage or extra-axial collection. Multifocal hyperintense T2-weighted signal within the white matter. Normal volume of CSF spaces. No chronic microhemorrhage. Normal midline structures. Vascular: Normal flow voids. Skull and upper cervical spine: Normal marrow signal. Sinuses/Orbits: Negative. Other: None. IMPRESSION: 1. No acute intracranial abnormality. 2. Multifocal hyperintense T2-weighted signal within the white matter, nonspecific but most commonly due to chronic small vessel ischemia. Electronically Signed   By: Ulyses Jarred M.D.   On: 12/12/2019 01:50   DG CHEST PORT 1 VIEW  Result Date: 12/12/2019 CLINICAL DATA:  Dyspnea.  Prior coronary stenting. EXAM: PORTABLE CHEST 1 VIEW COMPARISON:  12/11/2019 FINDINGS: The cardiac silhouette remains mildly enlarged. Aortic atherosclerosis is noted. There is anterior eventration of the right hemidiaphragm. There are mild interstitial densities in the right greater than left lung bases which are new or increased compared to the prior study, and there is mild central pulmonary vascular congestion. No sizable pleural effusion or pneumothorax is identified. IMPRESSION: Mild pulmonary vascular congestion and mild bibasilar interstitial densities, query early interstitial edema. Electronically Signed   By: Logan Bores M.D.   On: 12/12/2019 11:06    Assessment and Plan:   1.  Abnormal ECG concerning for underlying ischemia but also in the setting of known cardiomyopathy with LVEF 20 to 25% and in the absence of frank chest discomfort.  She complains of epigastric  unease associated with recent emesis and loose stools over the last 3 days, no fevers but has a leukocytosis.  She has not been on her regular medications for at least the last few days including dual antiplatelet therapy.  2.  CAD  status post DES to the OM1 in August 2021.  Otherwise nonobstructive disease.  3.  Cardiomyopathy, most likely nonischemic and out of proportion to degree of CAD based on cardiac catheterization from August.  LVEF 20 to 25% with diffuse hypokinesis.  4.  Hypokalemia and hypomagnesemia in the setting of emesis and diarrhea.  5.  Epigastric discomfort.  Patient has history of cholecystectomy.  She does report IBS but states that the symptoms are different.  CT of abdomen pending per primary team.  Patient being admitted to the hospitalist service for further work-up.  CT of the abdomen pending.  Replete electrolytes.  From a cardiac perspective would resume aspirin, Brilinta, Entresto, Toprol-XL, and Lipitor.  Also start IV heparin for now.  Would arrange a follow-up echocardiogram in comparison to study from August.  We will consider further ischemic testing depending on clinical course. ECG abnormalities are concerning and she was off dual antiplatelet therapy for at least 2 days.  Continue to search for etiology of apparent GI symptoms.  Signed, Rozann Lesches, MD  12/12/2019 11:22 AM

## 2019-12-12 NOTE — Progress Notes (Signed)
PT Cancellation Note  Patient Details Name: Nicole [name] MRN: [id number] DOB: 16-Apr-1945   Cancelled Treatment:    Reason Eval/Treat Not Completed: Medical issues which prohibited therapy Pt with elevated troponin and other abnormal lab values. Per RN getting stat EKG. Will hold until pt medically appropriate and follow up as schedule allows.   Reuel Derby, PT, DPT  Acute Rehabilitation Services  Pager: [phone] Office: [phone]    Rudean Hitt 12/12/2019, 10:20 AM

## 2019-12-13 ENCOUNTER — Inpatient Hospital Stay (HOSPITAL_COMMUNITY): Payer: Medicare Other

## 2019-12-13 DIAGNOSIS — I5021 Acute systolic (congestive) heart failure: Secondary | ICD-10-CM

## 2019-12-13 DIAGNOSIS — A419 Sepsis, unspecified organism: Principal | ICD-10-CM

## 2019-12-13 DIAGNOSIS — Z9861 Coronary angioplasty status: Secondary | ICD-10-CM

## 2019-12-13 DIAGNOSIS — I5042 Chronic combined systolic (congestive) and diastolic (congestive) heart failure: Secondary | ICD-10-CM

## 2019-12-13 DIAGNOSIS — I2510 Atherosclerotic heart disease of native coronary artery without angina pectoris: Secondary | ICD-10-CM

## 2019-12-13 DIAGNOSIS — R4182 Altered mental status, unspecified: Secondary | ICD-10-CM

## 2019-12-13 LAB — CBC WITH DIFFERENTIAL/PLATELET
Abs Immature Granulocytes: 0.2 10*3/uL — ABNORMAL HIGH (ref 0.00–0.07)
Basophils Absolute: 0 10*3/uL (ref 0.0–0.1)
Basophils Relative: 0 %
Eosinophils Absolute: 0 10*3/uL (ref 0.0–0.5)
Eosinophils Relative: 0 %
HCT: 32.5 % — ABNORMAL LOW (ref 36.0–46.0)
Hemoglobin: 10.9 g/dL — ABNORMAL LOW (ref 12.0–15.0)
Immature Granulocytes: 2 %
Lymphocytes Relative: 5 %
Lymphs Abs: 0.7 10*3/uL (ref 0.7–4.0)
MCH: 31.1 pg (ref 26.0–34.0)
MCHC: 33.5 g/dL (ref 30.0–36.0)
MCV: 92.6 fL (ref 80.0–100.0)
Monocytes Absolute: 0.4 10*3/uL (ref 0.1–1.0)
Monocytes Relative: 3 %
Neutro Abs: 12.5 10*3/uL — ABNORMAL HIGH (ref 1.7–7.7)
Neutrophils Relative %: 90 %
Platelets: 185 10*3/uL (ref 150–400)
RBC: 3.51 MIL/uL — ABNORMAL LOW (ref 3.87–5.11)
RDW: 14.5 % (ref 11.5–15.5)
WBC: 13.8 10*3/uL — ABNORMAL HIGH (ref 4.0–10.5)
nRBC: 0 % (ref 0.0–0.2)

## 2019-12-13 LAB — URINALYSIS, ROUTINE W REFLEX MICROSCOPIC
Bilirubin Urine: NEGATIVE
Glucose, UA: 50 mg/dL — AB
Ketones, ur: NEGATIVE mg/dL
Nitrite: NEGATIVE
Protein, ur: NEGATIVE mg/dL
Specific Gravity, Urine: 1.014 (ref 1.005–1.030)
pH: 5 (ref 5.0–8.0)

## 2019-12-13 LAB — RAPID URINE DRUG SCREEN, HOSP PERFORMED
Amphetamines: NOT DETECTED
Barbiturates: NOT DETECTED
Benzodiazepines: POSITIVE — AB
Cocaine: NOT DETECTED
Opiates: NOT DETECTED
Tetrahydrocannabinol: NOT DETECTED

## 2019-12-13 LAB — GLUCOSE, CAPILLARY
Glucose-Capillary: 156 mg/dL — ABNORMAL HIGH (ref 70–99)
Glucose-Capillary: 160 mg/dL — ABNORMAL HIGH (ref 70–99)
Glucose-Capillary: 137 mg/dL — ABNORMAL HIGH (ref 70–99)
Glucose-Capillary: 190 mg/dL — ABNORMAL HIGH (ref 70–99)

## 2019-12-13 LAB — ECHOCARDIOGRAM LIMITED
Height: 62 in
Single Plane A4C EF: 22.4 %
Weight: 2472.68 oz

## 2019-12-13 LAB — MAGNESIUM: Magnesium: 2.1 mg/dL (ref 1.7–2.4)

## 2019-12-13 LAB — COMPREHENSIVE METABOLIC PANEL
ALT: 30 U/L (ref 0–44)
AST: 64 U/L — ABNORMAL HIGH (ref 15–41)
Albumin: 2.9 g/dL — ABNORMAL LOW (ref 3.5–5.0)
Alkaline Phosphatase: 48 U/L (ref 38–126)
Anion gap: 8 (ref 5–15)
BUN: 8 mg/dL (ref 8–23)
CO2: 22 mmol/L (ref 22–32)
Calcium: 7.6 mg/dL — ABNORMAL LOW (ref 8.9–10.3)
Chloride: 112 mmol/L — ABNORMAL HIGH (ref 98–111)
Creatinine, Ser: 0.95 mg/dL (ref 0.44–1.00)
GFR calc Af Amer: >60 mL/min (ref >60)
GFR calc non Af Amer: 60 mL/min — ABNORMAL LOW (ref >60)
Glucose, Bld: 193 mg/dL — ABNORMAL HIGH (ref 70–99)
Potassium: 4.5 mmol/L (ref 3.5–5.1)
Sodium: 142 mmol/L (ref 135–145)
Total Bilirubin: 1.1 mg/dL (ref 0.3–1.2)
Total Protein: 6 g/dL — ABNORMAL LOW (ref 6.5–8.1)

## 2019-12-13 LAB — LACTIC ACID, PLASMA: Lactic Acid, Venous: 1.9 mmol/L (ref 0.5–1.9)

## 2019-12-13 LAB — PROCALCITONIN: Procalcitonin: 0.94 ng/mL

## 2019-12-13 LAB — MRSA PCR SCREENING: MRSA by PCR: NEGATIVE

## 2019-12-13 MED ORDER — PERFLUTREN LIPID MICROSPHERE
1.0000 mL | INTRAVENOUS | Status: AC | PRN
  Administered 2019-12-13: 4 mL via INTRAVENOUS
  Filled 2019-12-13: qty 10

## 2019-12-13 MED ORDER — LACTATED RINGERS IV BOLUS
1000.0000 mL | Freq: Once | INTRAVENOUS | Status: AC
  Administered 2019-12-13: 1000 mL via INTRAVENOUS

## 2019-12-13 MED ORDER — LEVALBUTEROL HCL 1.25 MG/0.5ML IN NEBU
1.2500 mg | INHALATION_SOLUTION | Freq: Four times a day (QID) | RESPIRATORY_TRACT | Status: DC | PRN
  Administered 2019-12-17: 1.25 mg via RESPIRATORY_TRACT
  Filled 2019-12-13: qty 0.5

## 2019-12-13 NOTE — Progress Notes (Signed)
Echocardiogram 2D Echocardiogram has been performed.  [place] Deputy Sheranda Seabrooks 12/13/2019, 10:48 AM

## 2019-12-13 NOTE — Progress Notes (Signed)
PROGRESS NOTE  Nicole [name] JJK:[id number] DOB: 26-May-1945 DOA: 12/11/2019 PCP: [name], MD  Brief History   Nicole [name] is a 74 y.o. female with medical history significant of anemia, anxiety, depression, chronic back pain, bloating, occasional blurred vision, colon polyps, dizziness, history endometriosis, fatigue, fibromyalgia, memory difficulties, GERD, headache/migraine, arthralgias, lower extremity weakness, mild intermittent asthma, hyperlipidemia, overweight, type II DM, urinary incontinence, vaginitis who was brought to the emergency department by her daughter due to nausea, vomiting, diarrhea and dizziness since Thursday.  She denies melena or hematochezia.  No dysuria, frequency or hematuria.  She also complains of substernal/epigastric discomfort and palpitations.  She denies PND, orthopnea or recent pitting edema of the lower extremities.  No fever, chills, but stated she had some night sweats.  She denies dyspnea, productive cough, wheezing or hemoptysis.  She denies polyuria, polydipsia, polyphagia or blurred vision.  The patient also has not taken her medications in the last 2+ days.  She mentioned that she last took them on Thursday morning.  ED Course: Initial vital signs were temperature EMS gave the patient 500 mL of NS bolus and 4 mg of ondansetron IVP.  CBC shows a white count 20.3, hemoglobin 13.3 g/dL and platelets 331.Troponin was 218 and then 377 ng/L.  LFTs show a mildly increased bilirubin at 1.3 mg/dL, but were otherwise normal. BMP shows normal sodium, potassium, chloride and renal function.  CO2 was 20 mmol/L.  Her anion gap was 14.  Glucose is 254 and calcium 7.3 mg/dL.Ammonia level was 55.  TSH was normal.  Lactic acid 3.4 mmol/L.  D-dimer was 0.77.  Initially, her chest radiograph did not have any acute abnormality.  There was no effusion and cardiomediastinal contours are stable.  CT head and MRI of brain did not show any acute intracranial abnormality.     On the morning of 12/12/2019 the patient was tchypneic in the 20's, HR was elevated in the 120's. She was requiring 2L O2 to maintain SaO2 in the mid nineties.  Overnight the patient's elevated troponin increased some more. EKG was repeated and demonstrated lateral ST segment depressions.   She also had become agitated.   Due to her nausea and vomiting the patient had not taken her medications for at least 3 days. The patient is on DAPT for a stent placed in August. She is also on lorazepam which she takes twice a day.   Cardiology was consulted due to abnormal EKG, elevated troponins, and history of DAPT for stent placed in august. She was started on a heparin drip and restarted on her DAPT and CHF medications by cardiology. Echocardiogram was obtained. It demonstrated diffuse worsening of hypokinesis particularly in the septum. LVEF was 25-30% with severely decreased function by the left ventricle. LV was moderately dilated in size. There was normal diastolic parameters. RV was function was not well visualized and there was mildly elevated pulmonary artery systolic pressure. The patient has stated that she will refuse to return to the cath lab for any reason due to her allergy to IV contrast. I appreciate their help.  The patient has been placed on a CIWA protocol out of concern for benzodiazepine withdrawal.  She also has severe hypomagnesemia as well as hypokalemia. These are being supplemented and followed.  She also has a metabolic acidosis. Beta hydroxy butyric acid is negative. This was addressed with IV sodium bicarbonate. This has been stopped as acidosis has resolved.   She has developed a fever of 103. Blood cultures x  2 have been ordered. She has been started on zosyn and vancomycin. PCR for MRSA is negative, so vancomycin has been discontinued.   ABG 12/12/2019 demonstrates pH of 7.357, PCO2 of 34.8, PaO2 of 82.2, and SaO2 of 91.1. Bicarbonate of 18.3. This was obtained on  6L.  Consultants  . Cardiology . PCCM  Procedures  . None  Antibiotics   Anti-infectives (From admission, onward)   Start     Dose/Rate Route Frequency Ordered Stop   12/13/19 1500  vancomycin (VANCOREADY) IVPB 1250 mg/250 mL  Status:  Discontinued        1,250 mg 166.7 mL/hr over 90 Minutes Intravenous Every 24 hours 12/12/19 1450 12/13/19 1321   12/12/19 1500  vancomycin (VANCOREADY) IVPB 1500 mg/300 mL        1,500 mg 150 mL/hr over 120 Minutes Intravenous  Once 12/12/19 1449 12/12/19 1736   12/12/19 1445  piperacillin-tazobactam (ZOSYN) IVPB 3.375 g        3.375 g 12.5 mL/hr over 240 Minutes Intravenous Every 8 hours 12/12/19 1438     12/12/19 0330  cefTRIAXone (ROCEPHIN) 2 g in sodium chloride 0.9 % 100 mL IVPB  Status:  Discontinued        2 g 200 mL/hr over 30 Minutes Intravenous Every 24 hours 12/12/19 0329 12/12/19 1450   12/12/19 0330  metroNIDAZOLE (FLAGYL) IVPB 500 mg        500 mg 100 mL/hr over 60 Minutes Intravenous Every 8 hours 12/12/19 0329       Subjective  The patient's mental status is much improved. No new complaints.  Objective   Vitals:  Vitals:   12/13/19 0932 12/13/19 1110  BP:  105/63  Pulse:  98  Resp:  16  Temp:  99 F (37.2 C)  SpO2: 95% 97%   Exam:  Constitutional:  . The patient is awake, alert, and oriented x 3. No acute distress. Respiratory:  . No increased work of breathing. . No wheezes, rales, or rhonchi . No tactile fremitus Cardiovascular:  . Regular rate and rhythm. . No murmurs, ectopy, or gallups. . No lateral PMI. No thrills. Abdomen:  . Abdomen is soft, non-tender, non-distended . No hernias, masses, or organomegaly . Normoactive bowel sounds.  Musculoskeletal:  . No cyanosis, clubbing, or edema Skin:  . No rashes, lesions, ulcers . palpation of skin: no induration or nodules Neurologic:  Patient is confused and lethargic. She is not able to participate in evaluation. Psychiatric:  . Patient is confused  and lethargic. She is not able to participate in evaluation.  I have personally reviewed the following:   Today's Data  . Vitals, Troponins, CMP, CBC, Beta hydroxybutyric acid  Micro Data  . Blood cultures x 2: No growth  Imaging  . CXR: No acute findings  Cardiology Data  . EKG, x 3.  Scheduled Meds: . aspirin  81 mg Oral Daily  . atorvastatin  80 mg Oral QHS  . DULoxetine  60 mg Oral Daily  . enoxaparin (LOVENOX) injection  40 mg Subcutaneous Q24H  . folic acid  1 mg Oral Daily  . LORazepam  0-4 mg Intravenous Q6H   Followed by  . [START ON 12/14/2019] LORazepam  0-4 mg Intravenous Q12H  . LORazepam  1 mg Intravenous Once  . LORazepam  1 mg Oral BID  . metoprolol tartrate  10 mg Intravenous Q8H  . mometasone-formoterol  2 puff Inhalation BID  . multivitamin with minerals  1 tablet Oral Daily  . pantoprazole (  PROTONIX) IV  40 mg Intravenous Q24H  . potassium chloride SA  40 mEq Oral BID  . sacubitril-valsartan  1 tablet Oral BID  . thiamine  100 mg Oral Daily   Or  . thiamine  100 mg Intravenous Daily  . ticagrelor  90 mg Oral BID  . Vitamin D (Ergocalciferol)  50,000 Units Oral Q Sun   Continuous Infusions: . metronidazole Stopped (12/13/19 0701)  . piperacillin-tazobactam (ZOSYN)  IV 3.375 g (12/13/19 5102)    Principal Problem:   Sepsis due to undetermined organism [facility]) Active Problems:   Anxiety   Depression   GERD with esophagitis   Mixed diabetic hyperlipidemia associated with type 2 diabetes mellitus (HCC)   Elevated troponin   CAD S/P percutaneous coronary angioplasty   Nystagmus   Chronic combined systolic and diastolic congestive heart failure (HCC)   Type 2 diabetes mellitus (HCC)   Hyperammonemia (HCC)   Chest pain in adult   LOS: 1 day   A & P  Sepsis: Resolved. On 12/12/2019 the patient developed fever, tachycardia, tachypnea, worsening leukocytosis, altered mental status and hypoxia.  She has been started on zosyn, flagyl, and vancomycin.  Vancomycin was discontinued when MRSA returned negative. Blood cultures have had no growth.  Metabolic acidosis: Resolved. Due to sepsis: Sodium bicarbonate infusion stopped. Monitor.  Electrolyte abnormalities: Resolved. Mg on 12/12/2019 was less than 0.2. This was supplemented with IV magnesium sulfate. Potassium was also low at 3.4. This was supplemented. Repeat magnesium was 1.1 and repeat potassium was 3.0. Both were again supplemented. Monitor closely and continue to supplement as necessary.  The patient complained of epigastric/substernal chest discomfort. Her troponins were elevated at 218 upon presentation. This was increased to 377 5 hours later and then 721 at 0330 am. This was repeated later in the morning and troponin had decreased to 694. EKG demonstrated tachycardia and lateral ischemia that was new. Cardiology was consulted.   CAD: The patient had stent placed in the first week of August 2021. Due to her nausea/vomiting/and diarrhea she has not taken any of her medications for the past 3-4 days. She was started on a heparin drip and restarted on her DAPT and CHF medications by cardiology. Echocardiogram was obtained. It demonstrated diffuse worsening of hypokinesis particularly in the septum. LVEF was 25-30% with severely decreased function by the left ventricle. LV was moderately dilated in size. There was normal diastolic parameters. RV was function was not well visualized and there was mildly elevated pulmonary artery systolic pressure. The patient has stated that she will refuse to return to the cath lab for any reason due to her allergy to IV contrast. I appreciate their help.  Prolonged QT: Likely related to the patient's acidosis and electrolyte disturbances. Avoid QT prolonging medications. Follow EKG.  Acute on chronic combined systolic and diastolic heart failure:Echocardiogram was obtained. It demonstrated diffuse worsening of hypokinesis particularly in the septum. LVEF was  25-30% with severely decreased function by the left ventricle. LV was moderately dilated in size. There was normal diastolic parameters. RV was function was not well visualized and there was mildly elevated pulmonary artery systolic pressure.  Nausea, vomiting, abdominal pain: The patient had demonstrated epigastric discomfort but no nausea or vomiting since her presentation to [facility]. There has also been no diarrhea. She states that she did have some dry heaves in the ED yesterday.  She will be started on a clear liquid diet.  An attempt was made to further elucidate the causes for her complaints  with a CT of the abdomen and pelvis with contrast. The patient refused CT as well as prophylaxis for IV contrast dye. Stool will be sent for GI pathogen panel should she produce one.  Delirium: Resolved. Due to sepsis in the setting of benzodiazepine withdrawal. The patient is on a CIWA protocol for possible benzodiazepine withdrawal.  I have seen and examined this patient myself. I have spent 36 minutes in the care of this patient.  DVT prophylaxis: Enoxaparin CODE STATUIS: Full Code Family Communication: Daughter is at bedside Disposition: The patient has been transferred from the ED to the progressive unit Status is: Inpatient  Remains inpatient appropriate because:IV treatments appropriate due to intensity of illness or inability to take PO   Dispo: The patient is from: Home              Anticipated d/c is to: Home              Anticipated d/c date is: > 3 days              Patient currently is not medically stable to d/c.   Severity of Illness: The appropriate patient status for this patient is INPATIENT. Inpatient status is judged to be reasonable and necessary in order to provide the required intensity of service to ensure the patient's safety. The patient's presenting symptoms, physical exam findings, and initial radiographic and laboratory data in the context of their chronic comorbidities is  felt to place them at high risk for further clinical deterioration. Furthermore, it is not anticipated that the patient will be medically stable for discharge from the hospital within 2 midnights of admission. The following factors support the patient status of inpatient.   " The patient's presenting symptoms include delirium. " The worrisome physical exam findings include Abdominal pain. " The initial radiographic and laboratory data are worrisome because of Normal appearance of abdomen on x-ray. . " The chronic co-morbidities include CAD.   * I certify that at the point of admission it is my clinical judgment that the patient will require inpatient hospital care spanning beyond 2 midnights from the point of admission due to high intensity of service, high risk for further deterioration and high frequency of surveillance required.*  Sola Margolis, DO [facility] Direct contact: see [email or web address]  7PM-7AM contact night coverage as above 12/13/2019, 1:47 PM  LOS: 0 days

## 2019-12-13 NOTE — Progress Notes (Signed)
NAME:  Nicole [name], MRN:  [id number], DOB:  1945-10-26, LOS: 1 ADMISSION DATE:  12/11/2019, CONSULTATION DATE:  10/3 REFERRING MD:  Dr. Benny Lennert, CHIEF COMPLAINT:  N/V/D  Brief History   74 y/o F admitted 10/2 with hx of N/V/D.  Sepsis due to right lower lobe pneumonia  Past Medical History  CAD - s/p DES to OM1 10/2019 on DAPT NICM - LVEF 20-25% 10/2019 Hyperlipidemia  DM II  Mild Intermittent Asthma  COVID - 06/2019 Anxiety / Depression  Fibromyalgia  Anemia  GERD - with esophagitis, followed by Dr. Fuller Plan IBS Colon Polyps Obesity Urinary Incontinence  Expressive Aphasia - noted after LHC in 10/2019, CTA head/neck negative at that time, thought related to meds / possible benadryl  Significant Hospital Events   10/03 Admit with N/V/D  Consults:   PCCM Procedures:    Significant Diagnostic Tests:  CT Head 10/2 >> negative   Micro Data:  COVID 10/2 >> negative Influenza A/B 10/2 >> negative  BCx2 10/3 >>   Antimicrobials:  Flagyl 10/2 >>  Vanco 10/2 >>  Zosyn 10/2 >>   Interim history/subjective:  Patient is much more alert, awake and following commands.  Her tachycardia has improved and became afebrile  Objective   Blood pressure 105/63, pulse 98, temperature 99 F (37.2 C), temperature source Oral, resp. rate 16, height 5\' 2"  (1.575 m), weight 70.1 kg, SpO2 97 %.        Intake/Output Summary (Last 24 hours) at 12/13/2019 1208 Last data filed at 12/13/2019 0400 Gross per 24 hour  Intake 673.84 ml  Output --  Net 673.84 ml   Filed Weights   12/11/19 2037  Weight: 70.1 kg    Examination: General: elderly adult female lying in bed in NAD  HEENT: MM pink/dry, crusting around lips, anicteric Neuro: Awake, alert and following commands.  Her mental status is much improved.  Moving all 4 extremities spontaneously CV: s1s2 RRR, tachycardic , no m/r/g PULM: Reduced air entry at the bases especially on the right side GI: soft, bsx4 active  Extremities:  warm/dry, no edema  Skin: no rashes or lesions  Resolved Hospital Problem list      Assessment & Plan:   Sepsis, improved Suspected sources include possible right hilar/RLL developing infiltrate  Continue antibiotics Follow-up cultures-empiric abx as above   CAD s/p DES (10/2019) HFrEF / ICM  Abnormal EKG  Management per cardiology  Agitated Delirium, improved Possible Benzodiazepine Withdrawal  Possible Paradoxical Effect with Benadryl  Patient mental status is much better, resume home benzodiazepine  HAGMA, resolved  Electrolyte abnormalities: Hypomagnesemia, hypophosphatemia and hypocalcemia Closely monitor electrolytes and supplement  Intermittent Asthma  -continue Dulera  DM with Hyperglycemia  Blood sugars are better controlled Continue sliding scale insulin   PCCM will sign off, please call with questions  Best practice:  Diet: as tolerated  Pain/Anxiety/Delirium protocol (if indicated): PRN ativan  VAP protocol (if indicated): n/a  DVT prophylaxis: lovenox  GI prophylaxis: PPI  Glucose control: per primary  Mobility: as tolerated  Code Status: Full Code  Family Communication: Daughter updated at bedside Disposition: PCU, per [facility]   Labs   CBC: Recent Labs  Lab 12/11/19 1622 12/12/19 0736 12/12/19 1035 12/13/19 0125  WBC 20.3* 17.6* 18.9* 13.8*  NEUTROABS  --  14.7*  --  12.5*  HGB 13.3 11.4* 12.0 10.9*  HCT 39.4 34.1* 35.3* 32.5*  MCV 88.7 90.0 90.7 92.6  PLT 331 254 287 409    Basic Metabolic Panel: Recent  Labs  Lab 12/11/19 1622 12/12/19 0737 12/12/19 0902 12/12/19 1035 12/12/19 1507 12/13/19 0125 12/13/19 1019  NA 139 141  --  139 136 142  --   K 3.6 3.0*  --  3.0* 3.5 4.5  --   CL 105 106  --  103 103 112*  --   CO2 20* 19*  --  21* 21* 22  --   GLUCOSE 254* 166*  --  192* 349* 193*  --   BUN <5* 7*  --  8 7* 8  --   CREATININE 0.94 0.90  --  1.00 0.96 0.95  --   CALCIUM 7.3* 6.7*  --  6.7* 6.3* 7.6*  --   MG  --  0.2* 0.2*  0.9* 3.8*  --  2.1  PHOS  --  4.1  --  3.9  --   --   --    GFR: Estimated Creatinine Clearance: 49.1 mL/min (by C-G formula based on SCr of 0.95 mg/dL). Recent Labs  Lab 12/11/19 1622 12/11/19 2117 12/12/19 0327 12/12/19 0524 12/12/19 0736 12/12/19 1035 12/13/19 0125  PROCALCITON  --   --  <0.10  --   --   --  0.94  WBC 20.3*  --   --   --  17.6* 18.9* 13.8*  LATICACIDVEN  --  3.4*  --  1.8  --   --   --     Liver Function Tests: Recent Labs  Lab 12/11/19 2117 12/12/19 1035 12/12/19 1507 12/13/19 0125  AST 32 56* 60* 64*  ALT 21 26 26 30   ALKPHOS 63 53 52 48  BILITOT 1.3* 1.1 1.0 1.1  PROT 7.2 6.1* 5.9* 6.0*  ALBUMIN 3.6 3.1* 2.9* 2.9*   No results for input(s): LIPASE, AMYLASE in the last 168 hours. Recent Labs  Lab 12/11/19 2117 12/12/19 0736  AMMONIA 55* 52*    ABG    Component Value Date/Time   PHART 7.357 12/12/2019 1430   PCO2ART 34.8 12/12/2019 1430   PO2ART 82.2 (L) 12/12/2019 1430   HCO3 18.3 (L) 12/12/2019 1430   TCO2 31 10/21/2019 0947   ACIDBASEDEF 5.6 (H) 12/12/2019 1430   O2SAT 91.1 12/12/2019 1430     Coagulation Profile: Recent Labs  Lab 12/12/19 0327  INR 1.1    Cardiac Enzymes: No results for input(s): CKTOTAL, CKMB, CKMBINDEX, TROPONINI in the last 168 hours.  HbA1C: Hgb A1c MFr Bld  Date/Time Value Ref Range Status  10/16/2019 11:26 PM 6.5 (H) 4.8 - 5.6 % Final    Comment:    (NOTE) Pre diabetes:          5.7%-6.4%  Diabetes:              >6.4%  Glycemic control for   <7.0% adults with diabetes   06/24/2019 10:37 PM 6.5 (H) 4.8 - 5.6 % Final    Comment:    (NOTE) Pre diabetes:          5.7%-6.4% Diabetes:              >6.4% Glycemic control for   <7.0% adults with diabetes     CBG: Recent Labs  Lab 12/12/19 1506 12/12/19 2145 12/13/19 0634 12/13/19 1107  GLUCAP 191* 186* 156* 160*    Review of Systems:   Gen: Denies fever, chills, weight change, fatigue, night sweats HEENT: Denies blurred vision,  double vision, hearing loss, tinnitus, sinus congestion, rhinorrhea, sore throat, neck stiffness, dysphagia PULM: Denies shortness of breath, cough, sputum production,  hemoptysis, wheezing CV: Denies chest pain, edema, orthopnea, paroxysmal nocturnal dyspnea, palpitations GI: Denies abdominal pain, nausea, vomiting, diarrhea, hematochezia, melena, constipation, change in bowel habits GU: Denies dysuria, hematuria, polyuria, oliguria, urethral discharge Endocrine: Denies hot or cold intolerance, polyuria, polyphagia or appetite change Derm: Denies rash, dry skin, scaling or peeling skin change Heme: Denies easy bruising, bleeding, bleeding gums Neuro: Denies headache, numbness, weakness, slurred speech, loss of memory or consciousness   Past Medical History  She,  has a past medical history of Anemia, Anxiety, Back pain, CAD (coronary artery disease) (10/22/2019), Colon polyps, COVID-19, Endometriosis, Fibromyalgia, GERD with esophagitis, IBS (irritable bowel syndrome), Major depressive disorder, Migraine, Mild intermittent asthma, Mixed diabetic hyperlipidemia associated with type 2 diabetes mellitus ([place]), Obesity, Type 2 diabetes mellitus ([place]), and Urinary incontinence.   Surgical History    Past Surgical History:  Procedure Laterality Date   ABDOMINAL HYSTERECTOMY     TAH BSO   APPENDECTOMY  1999   CATARACT EXTRACTION     both   CHOLECYSTECTOMY     CORONARY STENT INTERVENTION N/A 10/21/2019   Procedure: CORONARY STENT INTERVENTION;  Surgeon: Troy Sine, MD;  Location: [facility];  Service: Cardiovascular;  Laterality: N/A;   DENTAL SURGERY     HERNIA REPAIR  2000   LAPAROSCOPIC ENDOMETRIOSIS FULGURATION     RIGHT/LEFT HEART CATH AND CORONARY ANGIOGRAPHY N/A 10/21/2019   Procedure: RIGHT/LEFT HEART CATH AND CORONARY ANGIOGRAPHY;  Surgeon: Troy Sine, MD;  Location: [facility];  Service: Cardiovascular;  Laterality: N/A;     Social History    reports that she has never smoked. She has never used smokeless tobacco. She reports current alcohol use. She reports that she does not use drugs.   Family History   Her family history includes Cancer in her brother, brother, paternal aunt, and paternal uncle; Diabetes in her daughter, father, and mother; Heart attack in her father; Heart failure in her father and mother; Hypertension in her father, maternal grandfather, maternal grandmother, mother, paternal grandfather, and paternal grandmother; Stroke in her father; Thyroid disease in her brother. There is no history of Colon cancer or Stomach cancer.   Allergies Allergies  Allergen Reactions   Fish-Derived Products Anaphylaxis   Shellfish Allergy Shortness Of Breath    In dye for scan.   Aspirin Nausea Only and Other (See Comments)    Pt stated it only gives her problems when she takes the high dose (325 mg)   Ivp Dye [Iodinated Diagnostic Agents]     Hx of ? anaphlaxis   Latex Other (See Comments)    Pt says she gets a "stinky infection"     Home Medications  Prior to Admission medications   Medication Sig Start Date End Date Taking? Authorizing Provider  Ascorbic Acid (VITAMIN C PO) Take 1 tablet by mouth daily.   Yes [provider]  aspirin 81 MG tablet Take 81 mg by mouth daily.   Yes [provider]  atorvastatin (LIPITOR) 80 MG tablet Take 1 tablet (80 mg total) by mouth at bedtime. 11/19/19 12/19/19 Yes Martinique, Peter M, MD  bismuth subsalicylate (PEPTO BISMOL) 262 MG/15ML suspension Take 30 mLs by mouth every 6 (six) hours as needed for indigestion.   Yes [provider]  DULoxetine (CYMBALTA) 60 MG capsule Take 60 mg by mouth daily. 08/10/19  Yes [provider]  ergocalciferol (VITAMIN D2) 50000 UNITS capsule Take 50,000 Units by mouth once a week.     Yes  [provider]  fluticasone (FLONASE) 50 MCG/ACT nasal spray Place 1 spray into both nostrils daily as needed for allergies  or rhinitis.  05/28/19  Yes [provider]  Fluticasone-Salmeterol (ADVAIR DISKUS) 250-50 MCG/DOSE AEPB Inhale 1 puff into the lungs 2 (two) times daily.   Yes [provider]  loratadine (CLARITIN) 10 MG tablet Take 1 tablet (10 mg total) by mouth daily. 06/28/19  Yes Ghimire, Henreitta Leber, MD  LORazepam (ATIVAN) 1 MG tablet Take 1 mg by mouth 2 (two) times daily.    Yes [provider]  metoprolol succinate (TOPROL-XL) 25 MG 24 hr tablet Take 1 tablet (25 mg total) by mouth daily. 11/19/19 12/19/19 Yes Martinique, Peter M, MD  NONFORMULARY OR COMPOUNDED ITEM Estradiol 0.02% vaginal cream prefilled applicators apply one twice weekly 05/23/15  Yes Fontaine, Belinda Block, MD  ondansetron (ZOFRAN) 4 MG tablet Take 4 mg by mouth every 8 (eight) hours as needed for nausea or vomiting.  11/22/19  Yes [provider]  oxyCODONE-acetaminophen (PERCOCET) 10-325 MG per tablet Take 1 tablet by mouth 2 (two) times daily as needed for pain.    Yes [provider]  pantoprazole (PROTONIX) 40 MG tablet Take 1 tablet (40 mg total) by mouth 2 (two) times daily. 08/14/11  Yes Ladene Artist, MD  potassium chloride SA (KLOR-CON) 20 MEQ tablet Take 2 tablets (40 mEq total) by mouth daily. 11/19/19 12/19/19 Yes Martinique, Peter M, MD  PROAIR HFA [id number] Base) MCG/ACT inhaler Inhale 2 puffs into the lungs every 4 (four) hours as needed for wheezing or shortness of breath. Uses as rescue inhaler Reported on 06/20/2015 Patient taking differently: Inhale 2 puffs into the lungs every 4 (four) hours as needed for wheezing or shortness of breath. Uses as rescue inhaler Reported on 10/16/2019 06/28/19  Yes Ghimire, Henreitta Leber, MD  sacubitril-valsartan (ENTRESTO) 24-26 MG Take 1 tablet by mouth 2 (two) times daily. 11/19/19 12/19/19 Yes Martinique, Peter M, MD  ticagrelor (BRILINTA) 90 MG TABS tablet Take 1 tablet (90 mg total) by mouth 2 (two) times daily. 11/19/19 12/19/19 Yes Martinique, Peter M, MD  [facility] DELICA  LANCETS [address] [place]  06/03/16   [provider]  [facility] VERIO test strip  06/03/16   [provider]

## 2019-12-13 NOTE — Plan of Care (Signed)

## 2019-12-13 NOTE — Evaluation (Signed)
Physical Therapy Evaluation Patient Details Name: Nicole [name] MRN: [id number] DOB: 06-07-1945 Today's Date: 12/13/2019   History of Present Illness  Nicole [name] is a 74 y.o. female with medical history significant of anemia, anxiety, depression, chronic back pain, bloating, occasional blurred vision, colon polyps, dizziness, history endometriosis, fatigue, fibromyalgia, memory difficulties, GERD, headache/migraine, arthralgias, lower extremity weakness, mild intermittent asthma, hyperlipidemia, overweight, type II DM, urinary incontinence, vaginitis who was brought to the emergency department by her daughter due to nausea, vomiting, diarrhea and dizziness since Thursday.    Clinical Impression  Patient received in bed, awake, alert, talking about "getting down". Continues to feel like she is still up near ceiling. States "I'm not crazy." She requires min assist for supine to sit. She is able to maintain independent sitting balance without support. Supervision only. She is able to stand up with min assist of one. Posterior leaning with standing. Worsening with attempts at stepping. She is able to take a couple of side steps with min assist. Heavy posterior lean. Min assist needed to return to supine. She will continue to benefit from skilled PT while here to improve balance and functional independence.      Follow Up Recommendations SNF;Other (comment) (dependent on progress)    Equipment Recommendations  Other (comment) (TBD)    Recommendations for Other Services       Precautions / Restrictions Precautions Precautions: Fall Restrictions Weight Bearing Restrictions: No      Mobility  Bed Mobility Overal bed mobility: Needs Assistance Bed Mobility: Supine to Sit;Sit to Supine     Supine to sit: Min assist Sit to supine: Min assist   General bed mobility comments: Min assist to raise trunk, then to bring LEs back onto bed. Cues for positioning. She is hesitant with mobility  due to feeling like she may fall  Transfers Overall transfer level: Needs assistance Equipment used: 1 person hand held assist Transfers: Sit to/from Stand Sit to Stand: Min assist         General transfer comment: Patient requires min assist to power up, posterior lean in standing  Ambulation/Gait   Gait Distance (Feet): 2 Feet Assistive device: 1 person hand held assist   Gait velocity: decr   General Gait Details: she is able to take a couple of steps to her right along side of bed, but continues to have posterior leaning with mobility  Stairs            Wheelchair Mobility    Modified Rankin (Stroke Patients Only)       Balance Overall balance assessment: Needs assistance Sitting-balance support: Feet supported Sitting balance-Leahy Scale: Fair Sitting balance - Comments: able to sit unsupported Postural control: Posterior lean Standing balance support: Single extremity supported;During functional activity Standing balance-Leahy Scale: Poor Standing balance comment: posterior lean with standing                             Pertinent Vitals/Pain Pain Assessment: No/denies pain    Home Living Family/patient expects to be discharged to:: Private residence Living Arrangements: Alone Available Help at Discharge: Family;Available 24 hours/day Type of Home: House Home Access: Level entry     Home Layout: One level Home Equipment: None      Prior Function Level of Independence: Independent         Comments: Patient reports she was fully independent, driving     Hand Dominance  Extremity/Trunk Assessment   Upper Extremity Assessment Upper Extremity Assessment: Overall WFL for tasks assessed    Lower Extremity Assessment Lower Extremity Assessment: Overall WFL for tasks assessed    Cervical / Trunk Assessment Cervical / Trunk Assessment: Normal  Communication   Communication: No difficulties  Cognition  Arousal/Alertness: Awake/alert Behavior During Therapy: WFL for tasks assessed/performed Overall Cognitive Status: Impaired/Different from baseline Area of Impairment: Safety/judgement;Awareness                         Safety/Judgement: Decreased awareness of safety;Decreased awareness of deficits Awareness: Intellectual   General Comments: Patient continues to feel like she is above the bed.      General Comments      Exercises     Assessment/Plan    PT Assessment Patient needs continued PT services  PT Problem List Decreased strength;Decreased mobility;Decreased activity tolerance;Decreased balance;Decreased coordination       PT Treatment Interventions Therapeutic activities;Gait training;Functional mobility training;Balance training    PT Goals (Current goals can be found in the Care Plan section)  Acute Rehab PT Goals Patient Stated Goal: none stated PT Goal Formulation: With patient Time For Goal Achievement: 12/27/19 Potential to Achieve Goals: Fair    Frequency Min 2X/week   Barriers to discharge        Co-evaluation               AM-PAC PT "6 Clicks" Mobility  Outcome Measure Help needed turning from your back to your side while in a flat bed without using bedrails?: A Little Help needed moving from lying on your back to sitting on the side of a flat bed without using bedrails?: A Little Help needed moving to and from a bed to a chair (including a wheelchair)?: A Lot Help needed standing up from a chair using your arms (e.g., wheelchair or bedside chair)?: A Little Help needed to walk in hospital room?: A Lot Help needed climbing 3-5 steps with a railing? : A Lot 6 Click Score: 15    End of Session   Activity Tolerance: Patient tolerated treatment well Patient left: in bed;with call bell/phone within reach;with bed alarm set;with family/visitor present Nurse Communication: Mobility status PT Visit Diagnosis: Unsteadiness on feet  (R26.81);Other abnormalities of gait and mobility (R26.89);Difficulty in walking, not elsewhere classified (R26.2);History of falling (Z91.81)    Time: 9371-6967 PT Time Calculation (min) (ACUTE ONLY): 13 min   Charges:   PT Evaluation $PT Eval Moderate Complexity: 1 Mod          Sarahy Creedon, PT, GCS 12/13/19,9:43 AM

## 2019-12-13 NOTE — Progress Notes (Signed)
On called [facility] called and notified pt is not in the state of taking po meds tonight. Requested if her potassium and Protonix could be changed to iv. See mar for orders. Will continue to monitor.

## 2019-12-13 NOTE — Progress Notes (Signed)
DAILY PROGRESS NOTE   Patient Name: Nicole [name] Date of Encounter: 12/13/2019 Cardiologist: Peter Martinique, MD  Chief Complaint   No chest pain overnight  Patient Profile   Nicole [name] is a 74 y.o. female with a history of type 2 diabetes mellitus, hyperlipidemia, IBS, COVID-19 in April 2021 and CAD status post DES to the [place] in August 2021 who is being seen today for the evaluation of abnormal ECG and cardiac enzymes at the request of Dr. Benny Lennert.  Subjective   Said she was "on the ceiling last night" - continues to be dizzy. Noted deep TWI's yesterday - DAPT resumed and started on IV Heparin.  EKG on 10/2 did not show those changes. Electrolytes were significantly abnormal and somewhat improved today (calcium 7.6, potassium 4.5 - magnesium not updated (Was 3.8 yesterday). No repeat EKG today. Troponins rose 218, 377, 721 and now are flat elevated (721, 694, 677, 642).   Objective   Vitals:   12/13/19 0335 12/13/19 0630 12/13/19 0811 12/13/19 0854  BP: 109/68   97/71  Pulse: 89 91  92  Resp: (!) 23 19  20   Temp: 97.7 F (36.5 C)   98.4 F (36.9 C)  TempSrc: Axillary   Oral  SpO2: 100% 98% 100% 100%  Weight:      Height:        Intake/Output Summary (Last 24 hours) at 12/13/2019 1540 Last data filed at 12/13/2019 0400 Gross per 24 hour  Intake 673.84 ml  Output --  Net 673.84 ml   Filed Weights   12/11/19 2037  Weight: 70.1 kg    Physical Exam   General appearance: alert and no distress Neck: no carotid bruit, no JVD and thyroid not enlarged, symmetric, no tenderness/mass/nodules Lungs: clear to auscultation bilaterally Heart: regular rate and rhythm Abdomen: soft, non-tender; bowel sounds normal; no masses,  no organomegaly Extremities: extremities normal, atraumatic, no cyanosis or edema Pulses: 2+ and symmetric Skin: Skin color, texture, turgor normal. No rashes or lesions Neurologic: Mental status: Alert, oriented, thought content appropriate Psych:  Pleasant  Inpatient Medications    Scheduled Meds:  aspirin  81 mg Oral Daily   atorvastatin  80 mg Oral QHS   DULoxetine  60 mg Oral Daily   enoxaparin (LOVENOX) injection  40 mg Subcutaneous G86P   folic acid  1 mg Oral Daily   LORazepam  0-4 mg Intravenous Q6H   Followed by   Derrill Memo ON 12/14/2019] LORazepam  0-4 mg Intravenous Q12H   LORazepam  1 mg Intravenous Once   LORazepam  1 mg Oral BID   metoprolol tartrate  10 mg Intravenous Q8H   mometasone-formoterol  2 puff Inhalation BID   multivitamin with minerals  1 tablet Oral Daily   pantoprazole (PROTONIX) IV  40 mg Intravenous Q24H   potassium chloride SA  40 mEq Oral BID   sacubitril-valsartan  1 tablet Oral BID   thiamine  100 mg Oral Daily   Or   thiamine  100 mg Intravenous Daily   ticagrelor  90 mg Oral BID   Vitamin D (Ergocalciferol)  50,000 Units Oral Q Sun    Continuous Infusions:  metronidazole 500 mg (12/13/19 0318)   piperacillin-tazobactam (ZOSYN)  IV 3.375 g (12/13/19 0614)   vancomycin      PRN Meds: acetaminophen **OR** acetaminophen, albuterol, bismuth subsalicylate, fluticasone, levalbuterol, LORazepam **OR** LORazepam, morphine injection, ondansetron **OR** ondansetron (ZOFRAN) IV, oxyCODONE   Labs   Results for orders placed or performed during  the hospital encounter of 12/11/19 (from the past 48 hour(s))  Basic metabolic panel     Status: Abnormal   Collection Time: 12/11/19  4:22 PM  Result Value Ref Range   Sodium 139 135 - 145 mmol/L   Potassium 3.6 3.5 - 5.1 mmol/L   Chloride 105 98 - 111 mmol/L   CO2 20 (L) 22 - 32 mmol/L   Glucose, Bld 254 (H) 70 - 99 mg/dL    Comment: Glucose reference range applies only to samples taken after fasting for at least 8 hours.   BUN <5 (L) 8 - 23 mg/dL   Creatinine, Ser 0.94 0.44 - 1.00 mg/dL   Calcium 7.3 (L) 8.9 - 10.3 mg/dL   GFR calc non Af Amer >60 >60 mL/min   GFR calc Af Amer >60 >60 mL/min   Anion gap 14 5 - 15     Comment: Performed at [facility] [address] [place], [place] [address]  CBC     Status: Abnormal   Collection Time: 12/11/19  4:22 PM  Result Value Ref Range   WBC 20.3 (H) 4.0 - 10.5 K/uL   RBC 4.44 3.87 - 5.11 MIL/uL   Hemoglobin 13.3 12.0 - 15.0 g/dL   HCT 39.4 36 - 46 %   MCV 88.7 80.0 - 100.0 fL   MCH 30.0 26.0 - 34.0 pg   MCHC 33.8 30.0 - 36.0 g/dL   RDW 13.8 11.5 - 15.5 %   Platelets 331 150 - 400 K/uL   nRBC 0.0 0.0 - 0.2 %    Comment: Performed at [facility], [place] [address], [place], [place] [address]  Troponin I (High Sensitivity)     Status: Abnormal   Collection Time: 12/11/19  4:22 PM  Result Value Ref Range   Troponin I (High Sensitivity) 218 (HH) <18 ng/L    Comment: CRITICAL RESULT CALLED TO, READ BACK BY AND VERIFIED WITH: D.SIDBURY RN @ 2297 12/11/2019 BY C.EDENS (NOTE) Elevated high sensitivity troponin I (hsTnI) values and significant  changes across serial measurements may suggest ACS but many other  chronic and acute conditions are known to elevate hsTnI results.  Refer to the Links section for chest pain algorithms and additional  guidance. Performed at [facility], [place] [address] [place], [place] [address]   Troponin I (High Sensitivity)     Status: Abnormal   Collection Time: 12/11/19  9:17 PM  Result Value Ref Range   Troponin I (High Sensitivity) 377 (HH) <18 ng/L    Comment: CRITICAL VALUE NOTED.  VALUE IS CONSISTENT WITH PREVIOUSLY REPORTED AND CALLED VALUE. (NOTE) Elevated high sensitivity troponin I (hsTnI) values and significant  changes across serial measurements may suggest ACS but many other  chronic and acute conditions are known to elevate hsTnI results.  Refer to the Links section for chest pain algorithms and additional  guidance. Performed at [facility], [place] [address] [place], [place] [address]   TSH     Status: None   Collection Time: 12/11/19  9:17 PM  Result Value Ref Range   TSH 2.114  0.350 - 4.500 uIU/mL    Comment: Performed by a 3rd Generation assay with a functional sensitivity of <=0.01 uIU/mL. Performed at [facility], [place] [address], [place], [place] [address]   Hepatic function panel     Status: Abnormal   Collection Time: 12/11/19  9:17 PM  Result Value Ref Range   Total Protein  7.2 6.5 - 8.1 g/dL   Albumin 3.6 3.5 - 5.0 g/dL   AST 32 15 - 41 U/L   ALT 21 0 - 44 U/L   Alkaline Phosphatase 63 38 - 126 U/L   Total Bilirubin 1.3 (H) 0.3 - 1.2 mg/dL   Bilirubin, Direct 0.2 0.0 - 0.2 mg/dL   Indirect Bilirubin 1.1 (H) 0.3 - 0.9 mg/dL    Comment: Performed at [facility] [address], [place], [place] [address]  Ammonia     Status: Abnormal   Collection Time: 12/11/19  9:17 PM  Result Value Ref Range   Ammonia 55 (H) 9 - 35 umol/L    Comment: Performed at [facility], [place] [address], [place], [place] [address]  Lactic acid, plasma     Status: Abnormal   Collection Time: 12/11/19  9:17 PM  Result Value Ref Range   Lactic Acid, Venous 3.4 (HH) 0.5 - 1.9 mmol/L    Comment: CRITICAL RESULT CALLED TO, READ BACK BY AND VERIFIED WITH: RN Y LEBRON @2217  12/11/19 BY S GEZAHEGN Performed at [facility], [place] [address], [place], [place] [address]   D-dimer, quantitative (not at [facility])     Status: Abnormal   Collection Time: 12/11/19  9:17 PM  Result Value Ref Range   D-Dimer, Quant 0.77 (H) 0.00 - 0.50 ug/mL-FEU    Comment: (NOTE) At the manufacturer cut-off of 0.50 ug/mL FEU, this assay has been documented to exclude PE with a sensitivity and negative predictive value of 97 to 99%.  At this time, this assay has not been approved by the FDA to exclude DVT/VTE. Results should be correlated with clinical presentation. Performed at [facility], [place] [address] [place],  [address]   Respiratory Panel by RT PCR (Flu A&B, Covid) - Nasopharyngeal Swab     Status: None   Collection Time: 12/11/19 10:54 PM   Specimen:  Nasopharyngeal Swab  Result Value Ref Range   SARS Coronavirus 2 by RT PCR NEGATIVE NEGATIVE    Comment: (NOTE) SARS-CoV-2 target nucleic acids are NOT DETECTED.  The SARS-CoV-2 RNA is generally detectable in upper respiratoy specimens during the acute phase of infection. The lowest concentration of SARS-CoV-2 viral copies this assay can detect is 131 copies/mL. A negative result does not preclude SARS-Cov-2 infection and should not be used as the sole basis for treatment or other patient management decisions. A negative result may occur with  improper specimen collection/handling, submission of specimen other than nasopharyngeal swab, presence of viral mutation(s) within the areas targeted by this assay, and inadequate number of viral copies (<131 copies/mL). A negative result must be combined with clinical observations, patient history, and epidemiological information. The expected result is Negative.  Fact Sheet for Patients:  PinkCheek.be  Fact Sheet for Healthcare Providers:  GravelBags.it  This test is no t yet approved or cleared by the [place] FDA and  has been authorized for detection and/or diagnosis of SARS-CoV-2 by FDA under an Emergency Use Authorization (EUA). This EUA will remain  in effect (meaning this test can be used) for the duration of the COVID-19 declaration under Section 564(b)(1) of the Act, 21 U.S.C. section 360bbb-3(b)(1), unless the authorization is terminated or revoked sooner.     Influenza A by PCR NEGATIVE NEGATIVE   Influenza B by PCR NEGATIVE NEGATIVE    Comment: (NOTE) The Xpert Xpress SARS-CoV-2/FLU/RSV assay is intended as an aid in  the diagnosis of influenza from  Nasopharyngeal swab specimens and  should not be used as a sole basis for treatment. Nasal washings and  aspirates are unacceptable for Xpert Xpress SARS-CoV-2/FLU/RSV  testing.  Fact Sheet for  Patients: PinkCheek.be  Fact Sheet for Healthcare Providers: GravelBags.it  This test is not yet approved or cleared by the [place] FDA and  has been authorized for detection and/or diagnosis of SARS-CoV-2 by  FDA under an Emergency Use Authorization (EUA). This EUA will remain  in effect (meaning this test can be used) for the duration of the  Covid-19 declaration under Section 564(b)(1) of the Act, 21  U.S.C. section 360bbb-3(b)(1), unless the authorization is  terminated or revoked. Performed at [facility], [place] [address], [place], [place] [address]   Protime-INR     Status: None   Collection Time: 12/12/19  3:27 AM  Result Value Ref Range   Prothrombin Time 13.7 11.4 - 15.2 seconds   INR 1.1 0.8 - 1.2    Comment: (NOTE) INR goal varies based on device and disease states. Performed at [facility], [place] [address], [place], [place] [address]   Procalcitonin     Status: None   Collection Time: 12/12/19  3:27 AM  Result Value Ref Range   Procalcitonin <0.10 ng/mL    Comment:        Interpretation: PCT (Procalcitonin) <= 0.5 ng/mL: Systemic infection (sepsis) is not likely. Local bacterial infection is possible. (NOTE)       Sepsis PCT Algorithm           Lower Respiratory Tract                                      Infection PCT Algorithm    ----------------------------     ----------------------------         PCT < 0.25 ng/mL                PCT < 0.10 ng/mL          Strongly encourage             Strongly discourage   discontinuation of antibiotics    initiation of antibiotics    ----------------------------     -----------------------------       PCT 0.25 - 0.50 ng/mL            PCT 0.10 - 0.25 ng/mL               OR       >80% decrease in PCT            Discourage initiation of                                            antibiotics      Encourage discontinuation           of antibiotics     ----------------------------     -----------------------------         PCT >= 0.50 ng/mL              PCT 0.26 - 0.50 ng/mL               AND        <80% decrease in PCT  Encourage initiation of                                             antibiotics       Encourage continuation           of antibiotics    ----------------------------     -----------------------------        PCT >= 0.50 ng/mL                  PCT > 0.50 ng/mL               AND         increase in PCT                  Strongly encourage                                      initiation of antibiotics    Strongly encourage escalation           of antibiotics                                     -----------------------------                                           PCT <= 0.25 ng/mL                                                 OR                                        > 80% decrease in PCT                                      Discontinue / Do not initiate                                             antibiotics  Performed at [facility], [address] [place], [place] [address]   Troponin I (High Sensitivity)     Status: Abnormal   Collection Time: 12/12/19  3:27 AM  Result Value Ref Range   Troponin I (High Sensitivity) 721 (HH) <18 ng/L    Comment: CRITICAL VALUE NOTED.  VALUE IS CONSISTENT WITH PREVIOUSLY REPORTED AND CALLED VALUE. (NOTE) Elevated high sensitivity troponin I (hsTnI) values and significant  changes across serial measurements may suggest ACS but many other  chronic and acute conditions are known to elevate hsTnI results.  Refer to the Links section for chest pain algorithms and additional  guidance. Performed at [facility], [place] [address] [place], [place] [address]  Lactic acid, plasma     Status: None   Collection Time: 12/12/19  5:24 AM  Result Value Ref Range   Lactic Acid, Venous 1.8 0.5 - 1.9 mmol/L    Comment: Performed at [facility]  [address] [place], [place] [address]  CBC with Differential/Platelet     Status: Abnormal   Collection Time: 12/12/19  7:36 AM  Result Value Ref Range   WBC 17.6 (H) 4.0 - 10.5 K/uL   RBC 3.79 (L) 3.87 - 5.11 MIL/uL   Hemoglobin 11.4 (L) 12.0 - 15.0 g/dL   HCT 34.1 (L) 36 - 46 %   MCV 90.0 80.0 - 100.0 fL   MCH 30.1 26.0 - 34.0 pg   MCHC 33.4 30.0 - 36.0 g/dL   RDW 13.9 11.5 - 15.5 %   Platelets 254 150 - 400 K/uL   nRBC 0.0 0.0 - 0.2 %   Neutrophils Relative % 84 %   Neutro Abs 14.7 (H) 1.7 - 7.7 K/uL   Lymphocytes Relative 11 %   Lymphs Abs 2.0 0.7 - 4.0 K/uL   Monocytes Relative 4 %   Monocytes Absolute 0.8 0 - 1 K/uL   Eosinophils Relative 0 %   Eosinophils Absolute 0.0 0 - 0 K/uL   Basophils Relative 0 %   Basophils Absolute 0.0 0 - 0 K/uL   Immature Granulocytes 1 %   Abs Immature Granulocytes 0.14 (H) 0.00 - 0.07 K/uL    Comment: Performed at [facility] [address] [place], [place] [address]  Ammonia     Status: Abnormal   Collection Time: 12/12/19  7:36 AM  Result Value Ref Range   Ammonia 52 (H) 9 - 35 umol/L    Comment: Performed at [facility], [place] [address] [place], [place] [address]  Basic metabolic panel     Status: Abnormal   Collection Time: 12/12/19  7:37 AM  Result Value Ref Range   Sodium 141 135 - 145 mmol/L   Potassium 3.0 (L) 3.5 - 5.1 mmol/L   Chloride 106 98 - 111 mmol/L   CO2 19 (L) 22 - 32 mmol/L   Glucose, Bld 166 (H) 70 - 99 mg/dL    Comment: Glucose reference range applies only to samples taken after fasting for at least 8 hours.   BUN 7 (L) 8 - 23 mg/dL   Creatinine, Ser 0.90 0.44 - 1.00 mg/dL   Calcium 6.7 (L) 8.9 - 10.3 mg/dL   GFR calc non Af Amer >60 >60 mL/min   GFR calc Af Amer >60 >60 mL/min   Anion gap 16 (H) 5 - 15    Comment: Performed at [facility] [address] [place], [place] [address]  Phosphorus     Status: None   Collection Time: 12/12/19  7:37 AM  Result Value Ref Range   Phosphorus 4.1 2.5 -  4.6 mg/dL    Comment: Performed at [facility] [address] [place], [place] [address]  Magnesium     Status: Abnormal   Collection Time: 12/12/19  7:37 AM  Result Value Ref Range   Magnesium 0.2 (LL) 1.7 - 2.4 mg/dL    Comment: CRITICAL RESULT CALLED TO, READ BACK BY AND VERIFIED WITH: A.ADKIKNS RN @ [phone] 12/12/2019 BY C.EDENS Performed at [facility], [place] [address] [place], [place] [address]   Troponin I (High Sensitivity)     Status: Abnormal   Collection Time:  12/12/19  9:02 AM  Result Value Ref Range   Troponin I (High Sensitivity) 694 (HH) <18 ng/L    Comment: CRITICAL VALUE NOTED.  VALUE IS CONSISTENT WITH PREVIOUSLY REPORTED AND CALLED VALUE. (NOTE) Elevated high sensitivity troponin I (hsTnI) values and significant  changes across serial measurements may suggest ACS but many other  chronic and acute conditions are known to elevate hsTnI results.  Refer to the Links section for chest pain algorithms and additional  guidance. Performed at [facility], [place] [address], [place], [place] [address]   Magnesium     Status: Abnormal   Collection Time: 12/12/19  9:02 AM  Result Value Ref Range   Magnesium 0.2 (LL) 1.7 - 2.4 mg/dL    Comment: CRITICAL RESULT CALLED TO, READ BACK BY AND VERIFIED WITH: A.OAKLEY RN @ 1018 12/12/2019 BY C.EDENS Performed at [facility], [place] [address], [place], [place] [address]   Beta-hydroxybutyric acid     Status: None   Collection Time: 12/12/19  9:10 AM  Result Value Ref Range   Beta-Hydroxybutyric Acid 0.15 0.05 - 0.27 mmol/L    Comment: Performed at [facility] [address], [place], [place] [address]  Comprehensive metabolic panel     Status: Abnormal   Collection Time: 12/12/19 10:35 AM  Result Value Ref Range   Sodium 139 135 - 145 mmol/L   Potassium 3.0 (L) 3.5 - 5.1 mmol/L   Chloride 103 98 - 111 mmol/L   CO2 21 (L) 22 - 32 mmol/L   Glucose, Bld 192 (H) 70 - 99 mg/dL    Comment: Glucose  reference range applies only to samples taken after fasting for at least 8 hours.   BUN 8 8 - 23 mg/dL   Creatinine, Ser 1.00 0.44 - 1.00 mg/dL   Calcium 6.7 (L) 8.9 - 10.3 mg/dL   Total Protein 6.1 (L) 6.5 - 8.1 g/dL   Albumin 3.1 (L) 3.5 - 5.0 g/dL   AST 56 (H) 15 - 41 U/L   ALT 26 0 - 44 U/L   Alkaline Phosphatase 53 38 - 126 U/L   Total Bilirubin 1.1 0.3 - 1.2 mg/dL   GFR calc non Af Amer 56 (L) >60 mL/min   GFR calc Af Amer >60 >60 mL/min   Anion gap 15 5 - 15    Comment: Performed at [facility] [address], [place], [place] [address]  Magnesium     Status: Abnormal   Collection Time: 12/12/19 10:35 AM  Result Value Ref Range   Magnesium 0.9 (LL) 1.7 - 2.4 mg/dL    Comment: CRITICAL RESULT CALLED TO, READ BACK BY AND VERIFIED WITH: A.OAKLEY RN @ 1158 12/12/2019 BY C.EDENS Performed at [facility], [place] [address] [place], [place] [address]   Phosphorus     Status: None   Collection Time: 12/12/19 10:35 AM  Result Value Ref Range   Phosphorus 3.9 2.5 - 4.6 mg/dL    Comment: Performed at [facility] [address], [place], [place] [address]  CBC     Status: Abnormal   Collection Time: 12/12/19 10:35 AM  Result Value Ref Range   WBC 18.9 (H) 4.0 - 10.5 K/uL   RBC 3.89 3.87 - 5.11 MIL/uL   Hemoglobin 12.0 12.0 - 15.0 g/dL   HCT 35.3 (L) 36 - 46 %   MCV 90.7 80.0 - 100.0 fL   MCH 30.8 26.0 - 34.0 pg  MCHC 34.0 30.0 - 36.0 g/dL   RDW 14.1 11.5 - 15.5 %   Platelets 287 150 - 400 K/uL   nRBC 0.0 0.0 - 0.2 %    Comment: Performed at [facility], [place] [address] [place], [place] [address]  Troponin I (High Sensitivity)     Status: Abnormal   Collection Time: 12/12/19 10:35 AM  Result Value Ref Range   Troponin I (High Sensitivity) 677 (HH) <18 ng/L    Comment: CRITICAL VALUE NOTED.  VALUE IS CONSISTENT WITH PREVIOUSLY REPORTED AND CALLED VALUE. (NOTE) Elevated high sensitivity troponin I (hsTnI) values and significant  changes across  serial measurements may suggest ACS but many other  chronic and acute conditions are known to elevate hsTnI results.  Refer to the Links section for chest pain algorithms and additional  guidance. Performed at [facility], [place] [address] [place], [place] [address]   Troponin I (High Sensitivity)     Status: Abnormal   Collection Time: 12/12/19 12:41 PM  Result Value Ref Range   Troponin I (High Sensitivity) 642 (HH) <18 ng/L    Comment: CRITICAL VALUE NOTED.  VALUE IS CONSISTENT WITH PREVIOUSLY REPORTED AND CALLED VALUE. (NOTE) Elevated high sensitivity troponin I (hsTnI) values and significant  changes across serial measurements may suggest ACS but many other  chronic and acute conditions are known to elevate hsTnI results.  Refer to the Links section for chest pain algorithms and additional  guidance. Performed at [facility], [place] [address] [place], [place] [address]   Blood gas, arterial     Status: Abnormal   Collection Time: 12/12/19  2:30 PM  Result Value Ref Range   FIO2 36.00    pH, Arterial 7.357 7.35 - 7.45   pCO2 arterial 34.8 32 - 48 mmHg   pO2, Arterial 82.2 (L) 83 - 108 mmHg   Bicarbonate 18.3 (L) 20.0 - 28.0 mmol/L   Acid-base deficit 5.6 (H) 0.0 - 2.0 mmol/L   O2 Saturation 91.1 %   Patient temperature 39.9    Collection site RIGHT RADIAL    Drawn by [phone]    Sample type ARTERIAL DRAW    Allens test (pass/fail) PASS PASS    Comment: Performed at [facility], [place] [address] [place], [place] [address]  Glucose, capillary     Status: Abnormal   Collection Time: 12/12/19  3:06 PM  Result Value Ref Range   Glucose-Capillary 191 (H) 70 - 99 mg/dL    Comment: Glucose reference range applies only to samples taken after fasting for at least 8 hours.  Comprehensive metabolic panel     Status: Abnormal   Collection Time: 12/12/19  3:07 PM  Result Value Ref Range   Sodium 136 135 - 145 mmol/L   Potassium 3.5 3.5 - 5.1 mmol/L   Chloride 103  98 - 111 mmol/L   CO2 21 (L) 22 - 32 mmol/L   Glucose, Bld 349 (H) 70 - 99 mg/dL    Comment: Glucose reference range applies only to samples taken after fasting for at least 8 hours.   BUN 7 (L) 8 - 23 mg/dL   Creatinine, Ser 0.96 0.44 - 1.00 mg/dL   Calcium 6.3 (LL) 8.9 - 10.3 mg/dL    Comment: CRITICAL RESULT CALLED TO, READ BACK BY AND VERIFIED WITH: RN A RUDD AT 1601 12/12/19 BY L BENFIELD    Total Protein 5.9 (L) 6.5 - 8.1 g/dL   Albumin 2.9 (L)  3.5 - 5.0 g/dL   AST 60 (H) 15 - 41 U/L   ALT 26 0 - 44 U/L   Alkaline Phosphatase 52 38 - 126 U/L   Total Bilirubin 1.0 0.3 - 1.2 mg/dL   GFR calc non Af Amer 59 (L) >60 mL/min   GFR calc Af Amer >60 >60 mL/min   Anion gap 12 5 - 15    Comment: Performed at [facility] [address], [place], [place] [address]  Magnesium     Status: Abnormal   Collection Time: 12/12/19  3:07 PM  Result Value Ref Range   Magnesium 3.8 (H) 1.7 - 2.4 mg/dL    Comment: Performed at [facility] [address], [place], [place] [address]  Culture, blood (routine x 2)     Status: None (Preliminary result)   Collection Time: 12/12/19  3:12 PM   Specimen: BLOOD RIGHT HAND  Result Value Ref Range   Specimen Description BLOOD RIGHT HAND    Special Requests      BOTTLES DRAWN AEROBIC ONLY Blood Culture results may not be optimal due to an inadequate volume of blood received in culture bottles Performed at [facility], [place] [address], [place], [place] [address]    Culture PENDING    Report Status PENDING   D-dimer, quantitative (not at [facility])     Status: Abnormal   Collection Time: 12/12/19  6:38 PM  Result Value Ref Range   D-Dimer, Quant 0.99 (H) 0.00 - 0.50 ug/mL-FEU    Comment: (NOTE) At the manufacturer cut-off of 0.50 ug/mL FEU, this assay has been documented to exclude PE with a sensitivity and negative predictive value of 97 to 99%.  At this time, this assay has not been approved by the FDA to exclude DVT/VTE. Results should  be correlated with clinical presentation. Performed at [facility], [place] [address], [place], [place] [address]   Glucose, capillary     Status: Abnormal   Collection Time: 12/12/19  9:45 PM  Result Value Ref Range   Glucose-Capillary 186 (H) 70 - 99 mg/dL    Comment: Glucose reference range applies only to samples taken after fasting for at least 8 hours.  CBC with Differential     Status: Abnormal   Collection Time: 12/13/19  1:25 AM  Result Value Ref Range   WBC 13.8 (H) 4.0 - 10.5 K/uL   RBC 3.51 (L) 3.87 - 5.11 MIL/uL   Hemoglobin 10.9 (L) 12.0 - 15.0 g/dL   HCT 32.5 (L) 36 - 46 %   MCV 92.6 80.0 - 100.0 fL   MCH 31.1 26.0 - 34.0 pg   MCHC 33.5 30.0 - 36.0 g/dL   RDW 14.5 11.5 - 15.5 %   Platelets 185 150 - 400 K/uL   nRBC 0.0 0.0 - 0.2 %   Neutrophils Relative % 90 %   Neutro Abs 12.5 (H) 1.7 - 7.7 K/uL   Lymphocytes Relative 5 %   Lymphs Abs 0.7 0.7 - 4.0 K/uL   Monocytes Relative 3 %   Monocytes Absolute 0.4 0 - 1 K/uL   Eosinophils Relative 0 %   Eosinophils Absolute 0.0 0 - 0 K/uL   Basophils Relative 0 %   Basophils Absolute 0.0 0 - 0 K/uL   Immature Granulocytes 2 %   Abs Immature Granulocytes 0.20 (H) 0.00 - 0.07 K/uL    Comment: Performed at [facility], [address], [place], [place]  [address]  Comprehensive metabolic panel     Status: Abnormal   Collection Time: 12/13/19  1:25 AM  Result Value Ref Range   Sodium 142 135 - 145 mmol/L   Potassium 4.5 3.5 - 5.1 mmol/L   Chloride 112 (H) 98 - 111 mmol/L   CO2 22 22 - 32 mmol/L   Glucose, Bld 193 (H) 70 - 99 mg/dL    Comment: Glucose reference range applies only to samples taken after fasting for at least 8 hours.   BUN 8 8 - 23 mg/dL   Creatinine, Ser 0.95 0.44 - 1.00 mg/dL   Calcium 7.6 (L) 8.9 - 10.3 mg/dL   Total Protein 6.0 (L) 6.5 - 8.1 g/dL   Albumin 2.9 (L) 3.5 - 5.0 g/dL   AST 64 (H) 15 - 41 U/L   ALT 30 0 - 44 U/L   Alkaline Phosphatase 48 38 - 126 U/L   Total Bilirubin 1.1 0.3 -  1.2 mg/dL   GFR calc non Af Amer 60 (L) >60 mL/min   GFR calc Af Amer >60 >60 mL/min   Anion gap 8 5 - 15    Comment: Performed at [facility] [address] [place], [place] [address]  Procalcitonin     Status: None   Collection Time: 12/13/19  1:25 AM  Result Value Ref Range   Procalcitonin 0.94 ng/mL    Comment:        Interpretation: PCT > 0.5 ng/mL and <= 2 ng/mL: Systemic infection (sepsis) is possible, but other conditions are known to elevate PCT as well. (NOTE)       Sepsis PCT Algorithm           Lower Respiratory Tract                                      Infection PCT Algorithm    ----------------------------     ----------------------------         PCT < 0.25 ng/mL                PCT < 0.10 ng/mL          Strongly encourage             Strongly discourage   discontinuation of antibiotics    initiation of antibiotics    ----------------------------     -----------------------------       PCT 0.25 - 0.50 ng/mL            PCT 0.10 - 0.25 ng/mL               OR       >80% decrease in PCT            Discourage initiation of                                            antibiotics      Encourage discontinuation           of antibiotics    ----------------------------     -----------------------------         PCT >= 0.50 ng/mL              PCT 0.26 - 0.50 ng/mL  AND       <80% decrease in PCT             Encourage initiation of                                             antibiotics       Encourage continuation           of antibiotics    ----------------------------     -----------------------------        PCT >= 0.50 ng/mL                  PCT > 0.50 ng/mL               AND         increase in PCT                  Strongly encourage                                      initiation of antibiotics    Strongly encourage escalation           of antibiotics                                     -----------------------------                                            PCT <= 0.25 ng/mL                                                 OR                                        > 80% decrease in PCT                                      Discontinue / Do not initiate                                             antibiotics  Performed at [facility], [place] [address], [place], [place] [address]   MRSA PCR Screening     Status: None   Collection Time: 12/13/19  6:20 AM   Specimen: Nasopharyngeal  Result Value Ref Range   MRSA by PCR NEGATIVE NEGATIVE    Comment:        The GeneXpert MRSA Assay (FDA approved for NASAL specimens only), is one component of a comprehensive MRSA colonization surveillance program. It is not intended to diagnose MRSA infection nor to guide or monitor treatment for MRSA infections. Performed at [facility], [place] [address], [place], [place] [address]   Glucose, capillary     Status: Abnormal   Collection Time: 12/13/19  6:34 AM  Result Value Ref Range   Glucose-Capillary 156 (H) 70 - 99 mg/dL    Comment: Glucose reference range applies only to samples taken after fasting for at least 8 hours.  Urinalysis, Routine w reflex microscopic     Status: Abnormal   Collection Time: 12/13/19  6:39 AM  Result Value Ref Range   Color, Urine YELLOW YELLOW   APPearance CLEAR CLEAR   Specific Gravity, Urine 1.014 1.005 - 1.030   pH 5.0 5.0 - 8.0   Glucose, UA 50 (A) NEGATIVE mg/dL   Hgb urine dipstick SMALL (A) NEGATIVE   Bilirubin Urine NEGATIVE NEGATIVE   Ketones, ur NEGATIVE NEGATIVE mg/dL   Protein, ur NEGATIVE NEGATIVE mg/dL   Nitrite NEGATIVE NEGATIVE   Leukocytes,Ua TRACE (A) NEGATIVE   RBC / HPF 0-5 0 - 5 RBC/hpf   WBC, UA 0-5 0 - 5 WBC/hpf   Bacteria, UA RARE (A) NONE SEEN   Squamous Epithelial / LPF 0-5 0 - 5    Comment: Performed at [facility] [address], [place], [place] [address]  Urine rapid drug screen (hosp performed)     Status: Abnormal   Collection Time: 12/13/19  6:39  AM  Result Value Ref Range   Opiates NONE DETECTED NONE DETECTED   Cocaine NONE DETECTED NONE DETECTED   Benzodiazepines POSITIVE (A) NONE DETECTED   Amphetamines NONE DETECTED NONE DETECTED   Tetrahydrocannabinol NONE DETECTED NONE DETECTED   Barbiturates NONE DETECTED NONE DETECTED    Comment: (NOTE) DRUG SCREEN FOR MEDICAL PURPOSES ONLY.  IF CONFIRMATION IS NEEDED FOR ANY PURPOSE, NOTIFY LAB WITHIN 5 DAYS.  LOWEST DETECTABLE LIMITS FOR URINE DRUG SCREEN Drug Class                     Cutoff (ng/mL) Amphetamine and metabolites    1000 Barbiturate and metabolites    200 Benzodiazepine                 263 Tricyclics and metabolites     300 Opiates and metabolites        300 Cocaine and metabolites        300 THC                            50 Performed at [facility], [place] [address] [place], [place] [address]     ECG   pending  Telemetry   Sinus rhythm with ST depression - Personally Reviewed  Radiology    DG Chest 2 View  Result Date: 12/11/2019 CLINICAL DATA:  Shortness of breath. EXAM: CHEST - 2 VIEW COMPARISON:  October 16, 2019. FINDINGS: Stable cardiomediastinal silhouette. No pneumothorax or pleural effusion is noted. Both lungs are clear. The visualized skeletal structures are unremarkable. IMPRESSION: No active cardiopulmonary disease. Electronically Signed   By: Marijo Conception M.D.   On: 12/11/2019 16:54   CT Head Wo Contrast  Result Date: 12/11/2019 CLINICAL DATA:  Nausea vomiting dizziness EXAM: CT HEAD WITHOUT CONTRAST TECHNIQUE: Contiguous axial images were obtained from the base of the skull through the vertex without intravenous contrast. COMPARISON:  October 21, 2019 FINDINGS: Brain: No evidence of acute territorial infarction, hemorrhage, hydrocephalus,extra-axial collection or mass lesion/mass effect. There is dilatation the ventricles and sulci consistent with  age-related atrophy. Low-attenuation changes in the deep white matter consistent with  small vessel ischemia. Vascular: No hyperdense vessel or unexpected calcification. Skull: The skull is intact. No fracture or focal lesion identified. Sinuses/Orbits: The visualized paranasal sinuses and mastoid air cells are clear. The orbits and globes intact. Other: None IMPRESSION: No acute intracranial abnormality. Findings consistent with age related atrophy and chronic small vessel ischemia Electronically Signed   By: Prudencio Pair M.D.   On: 12/11/2019 21:49   MR BRAIN WO CONTRAST  Result Date: 12/12/2019 CLINICAL DATA:  [place]iplopia EXAM: MRI HEAD WITHOUT CONTRAST TECHNIQUE: Multiplanar, multiecho pulse sequences of the brain and surrounding structures were obtained without intravenous contrast. COMPARISON:  None. FINDINGS: Brain: No acute infarct, acute hemorrhage or extra-axial collection. Multifocal hyperintense T2-weighted signal within the white matter. Normal volume of CSF spaces. No chronic microhemorrhage. Normal midline structures. Vascular: Normal flow voids. Skull and upper cervical spine: Normal marrow signal. Sinuses/Orbits: Negative. Other: None. IMPRESSION: 1. No acute intracranial abnormality. 2. Multifocal hyperintense T2-weighted signal within the white matter, nonspecific but most commonly due to chronic small vessel ischemia. Electronically Signed   By: Ulyses Jarred M.D.   On: 12/12/2019 01:50   US Abdomen Complete  Result Date: 12/13/2019 CLINICAL DATA:  Vomiting EXAM: ABDOMEN ULTRASOUND COMPLETE COMPARISON:  None similar FINDINGS: Gallbladder: History of cholecystectomy Common bile duct: Diameter: 4 mm Liver: Echogenic liver without focal lesion portal vein is patent on color Doppler imaging with normal direction of blood flow towards the liver. IVC: No abnormality visualized. Pancreas: Visualized portion unremarkable. Spleen: Rounded echogenic area measuring 1.2 Cm in the lower spleen. No splenic enlargement. Right Kidney: Length: 11 cm. Echogenicity within normal limits. No mass  or hydronephrosis visualized. Left Kidney: Length: 11.5 cm. Echogenicity within normal limits. No solid mass or hydronephrosis visualized. Simple interpolar cyst measuring up to 2.7 cm. 1 cm echogenic area measured in the interpolar left kidney, hilar fat versus incidental angiomyolipoma. Abdominal aorta: No aneurysm visualized. IMPRESSION: 1. No acute finding or explanation for symptoms. 2. Hepatic steatosis. 3. 12 mm echogenic lesion in the spleen, usually benign and incidental in isolation-often hemangioma. Electronically Signed   By: Monte Fantasia M.D.   On: 12/13/2019 06:55   DG CHEST PORT 1 VIEW  Result Date: 12/13/2019 CLINICAL DATA:  Acute respiratory failure with hypoxia. EXAM: PORTABLE CHEST 1 VIEW COMPARISON:  12/12/2019 FINDINGS: 0529 hours. Stable asymmetric elevation right hemidiaphragm. The cardio pericardial silhouette is enlarged. Interstitial markings are diffusely coarsened with chronic features. No pulmonary edema or focal airspace consolidation. No pleural effusion. The visualized bony structures of the thorax show no acute abnormality. Telemetry leads overlie the chest. IMPRESSION: Stable.  No acute cardiopulmonary findings. Electronically Signed   By: Misty [place] M.D.   On: 12/13/2019 07:50   DG CHEST PORT 1 VIEW  Result Date: 12/12/2019 CLINICAL DATA:  Tachypnea. EXAM: PORTABLE CHEST 1 VIEW COMPARISON:  Single-view of the chest 12/12/2019. PA and lateral chest 10/16/2019. FINDINGS: Mild left basilar atelectasis. Lungs otherwise clear. No pulmonary edema. Cardiomegaly. No pneumothorax or pleural effusion. No acute or focal bony abnormality. IMPRESSION: Cardiomegaly without acute disease. Electronically Signed   By: Inge Rise M.D.   On: 12/12/2019 14:43   DG CHEST PORT 1 VIEW  Result Date: 12/12/2019 CLINICAL DATA:  Dyspnea.  Prior coronary stenting. EXAM: PORTABLE CHEST 1 VIEW COMPARISON:  12/11/2019 FINDINGS: The cardiac silhouette remains mildly enlarged. Aortic  atherosclerosis is noted. There is anterior eventration of the right hemidiaphragm. There are mild interstitial  densities in the right greater than left lung bases which are new or increased compared to the prior study, and there is mild central pulmonary vascular congestion. No sizable pleural effusion or pneumothorax is identified. IMPRESSION: Mild pulmonary vascular congestion and mild bibasilar interstitial densities, query early interstitial edema. Electronically Signed   By: Logan Bores M.D.   On: 12/12/2019 11:06    Cardiac Studies   N/A  Assessment   1. Principal Problem: 2.   Sepsis due to undetermined organism ([place]) 3. Active Problems: 4.   Anxiety 5.   Depression 6.   GERD with esophagitis 7.   Mixed diabetic hyperlipidemia associated with type 2 diabetes mellitus ([place]) 8.   Elevated troponin 9.   CAD S/P percutaneous coronary angioplasty 10.   Nystagmus 11.   Chronic combined systolic and diastolic congestive heart failure ([place]) 12.   Type 2 diabetes mellitus ([place]) 13.   Hyperammonemia ([place]) 14.   Chest pain in adult 15.   Plan   1. NSTEMI - noted to have significant TWI's anterolaterally on EKG yesterday, I have ordered a repeat today. Denies chest pain. Was "on the ceiling" last night - required sedation/restraints. Multiple elecrolyte abnormalities may also explain EKG changes, however, troponin rose and is now stable. On heparin and restarted DAPT. Repeat limited echo for new WMA's. Was adamant that she does not want to go back to the cath lab for any reason due to concerns about contrast dye reaction. 2. ICM/Chronic combined systolic/diastolic CHF - LVEF 35-32% - appears euvolemic on exam, restarted HF meds. Monitor I's/O's, daily weights and renal function. 3. Sepsis - Tmax 103.8, PCCM following - on broad-spectrum antibiotics.   Time Spent Directly with Patient:  I have spent a total of 35 minutes with the patient reviewing hospital notes, telemetry, EKGs, labs  and examining the patient as well as establishing an assessment and plan that was discussed personally with the patient.  > 50% of time was spent in direct patient care.  Length of Stay:  LOS: 1 day   Pixie Casino, MD, [facility], [place] Director of the Advanced Lipid Disorders &  Cardiovascular Risk Reduction Clinic Diplomate of the American Board of Clinical Lipidology Attending Cardiologist  Direct Dial: [phone]   Fax: [phone]  Website:  [email or web address]  Nadean Corwin Marvena Tally 12/13/2019, 9:25 AM

## 2019-12-13 NOTE — Progress Notes (Signed)
0600: pt woke up AO x3, unable to say who the president was. She said she doesn't keep up with it. West belt taken off. Following commands and safety instruction, daughter by bedside .  0655: called to pt's room, she said she is floating in air. Pt was ao x3. Just felt like she was floating in air, or upside down sometime. Following safety measures. Doing well without restrain, drinking. Bed alarm on. Will continue to monitor.

## 2019-12-13 NOTE — Progress Notes (Signed)
Mobility Specialist - Progress Note   12/13/19 1515  Mobility  Activity Dangled on edge of bed  Level of Assistance Minimal assist, patient does 75% or more  Assistive Device None  Mobility Response Tolerated poorly  Mobility performed by Mobility specialist  $Mobility charge 1 Mobility    Pre-mobility: 101 HR, 99/57 BP, 96% SpO2 During mobility: 113 HR, 92% SpO2 Post-mobility: 113 HR, 105/69 BP, 98% SpO2  Pt seemed confused prior to mobility. Pt required verbal cues for movement of legs and trunk rotation when sitting up as well as assistance in holding her trunk stable. She was unable to progress as she stated her "asthma was acting up" and she began to lose focus, with her eyes rolling back. Pt assisted back into bed and she endorsed feeling better. RN notified.   Pricilla Handler Mobility Specialist Mobility Specialist Phone: [phone]

## 2019-12-13 NOTE — Progress Notes (Signed)
Inpatient Diabetes Program Recommendations  AACE/ADA: New Consensus Statement on Inpatient Glycemic Control (2015)  Target Ranges:  Prepandial:   less than 140 mg/dL      Peak postprandial:   less than 180 mg/dL (1-2 hours)      Critically ill patients:  140 - 180 mg/dL   Lab Results  Component Value Date   GLUCAP 160 (H) 12/13/2019   HGBA1C 6.5 (H) 10/16/2019    Review of Glycemic Control Results for DYANN, [place] (MRN [id number]) as of 12/13/2019 11:11  Ref. Range 10/22/2019 08:11 12/12/2019 15:06 12/12/2019 21:45 12/13/2019 06:34 12/13/2019 11:07  Glucose-Capillary Latest Ref Range: 70 - 99 mg/dL 133 (H) 191 (H) 186 (H) 156 (H) 160 (H)   Diabetes history:  DM2 Outpatient Diabetes medications:  None Current orders for Inpatient glycemic control:  None  Inpatient Diabetes Program Recommendations:    Glycemic control order set; Novolog 0-9 units tid  Will continue to follow while inpatient.  Thank you, Reche Dixon, RN, BSN Diabetes Coordinator Inpatient Diabetes Program [phone] (team pager from 8a-5p)

## 2019-12-14 ENCOUNTER — Inpatient Hospital Stay (HOSPITAL_COMMUNITY): Payer: Medicare Other

## 2019-12-14 DIAGNOSIS — H5500 Unspecified nystagmus: Secondary | ICD-10-CM

## 2019-12-14 DIAGNOSIS — J9601 Acute respiratory failure with hypoxia: Secondary | ICD-10-CM

## 2019-12-14 DIAGNOSIS — F13239 Sedative, hypnotic or anxiolytic dependence with withdrawal, unspecified: Secondary | ICD-10-CM

## 2019-12-14 DIAGNOSIS — F13939 Sedative, hypnotic or anxiolytic use, unspecified with withdrawal, unspecified: Secondary | ICD-10-CM

## 2019-12-14 LAB — CBC WITH DIFFERENTIAL/PLATELET
Abs Immature Granulocytes: 0.09 10*3/uL — ABNORMAL HIGH (ref 0.00–0.07)
Basophils Absolute: 0 10*3/uL (ref 0.0–0.1)
Basophils Relative: 0 %
Eosinophils Absolute: 0.1 10*3/uL (ref 0.0–0.5)
Eosinophils Relative: 1 %
HCT: 30.5 % — ABNORMAL LOW (ref 36.0–46.0)
Hemoglobin: 10 g/dL — ABNORMAL LOW (ref 12.0–15.0)
Immature Granulocytes: 1 %
Lymphocytes Relative: 9 %
Lymphs Abs: 0.9 10*3/uL (ref 0.7–4.0)
MCH: 31.3 pg (ref 26.0–34.0)
MCHC: 32.8 g/dL (ref 30.0–36.0)
MCV: 95.3 fL (ref 80.0–100.0)
Monocytes Absolute: 0.6 10*3/uL (ref 0.1–1.0)
Monocytes Relative: 6 %
Neutro Abs: 7.7 10*3/uL (ref 1.7–7.7)
Neutrophils Relative %: 83 %
Platelets: 182 10*3/uL (ref 150–400)
RBC: 3.2 MIL/uL — ABNORMAL LOW (ref 3.87–5.11)
RDW: 14.4 % (ref 11.5–15.5)
WBC: 9.3 10*3/uL (ref 4.0–10.5)
nRBC: 0.3 % — ABNORMAL HIGH (ref 0.0–0.2)

## 2019-12-14 LAB — GLUCOSE, CAPILLARY
Glucose-Capillary: 98 mg/dL (ref 70–99)
Glucose-Capillary: 165 mg/dL — ABNORMAL HIGH (ref 70–99)
Glucose-Capillary: 103 mg/dL — ABNORMAL HIGH (ref 70–99)
Glucose-Capillary: 184 mg/dL — ABNORMAL HIGH (ref 70–99)

## 2019-12-14 LAB — BASIC METABOLIC PANEL
Anion gap: 14 (ref 5–15)
Anion gap: 9 (ref 5–15)
BUN: 10 mg/dL (ref 8–23)
BUN: 10 mg/dL (ref 8–23)
CO2: 17 mmol/L — ABNORMAL LOW (ref 22–32)
CO2: 25 mmol/L (ref 22–32)
Calcium: 7.6 mg/dL — ABNORMAL LOW (ref 8.9–10.3)
Calcium: 8.8 mg/dL — ABNORMAL LOW (ref 8.9–10.3)
Chloride: 108 mmol/L (ref 98–111)
Chloride: 107 mmol/L (ref 98–111)
Creatinine, Ser: 0.88 mg/dL (ref 0.44–1.00)
Creatinine, Ser: 0.95 mg/dL (ref 0.44–1.00)
GFR calc Af Amer: >60 mL/min (ref >60)
GFR calc non Af Amer: >60 mL/min (ref >60)
GFR calc non Af Amer: 60 mL/min — ABNORMAL LOW (ref >60)
Glucose, Bld: 149 mg/dL — ABNORMAL HIGH (ref 70–99)
Glucose, Bld: 139 mg/dL — ABNORMAL HIGH (ref 70–99)
Potassium: 5 mmol/L (ref 3.5–5.1)
Potassium: 4.1 mmol/L (ref 3.5–5.1)
Sodium: 139 mmol/L (ref 135–145)
Sodium: 141 mmol/L (ref 135–145)

## 2019-12-14 LAB — LACTIC ACID, PLASMA
Lactic Acid, Venous: 4.6 mmol/L (ref 0.5–1.9)
Lactic Acid, Venous: 2.3 mmol/L (ref 0.5–1.9)

## 2019-12-14 LAB — PROCALCITONIN: Procalcitonin: 0.79 ng/mL

## 2019-12-14 LAB — MAGNESIUM: Magnesium: 2 mg/dL (ref 1.7–2.4)

## 2019-12-14 MED ORDER — LACTATED RINGERS IV SOLN: INTRAVENOUS | Status: DC

## 2019-12-14 MED ORDER — SODIUM CHLORIDE 0.9 % IV SOLN
1.0000 g | INTRAVENOUS | Status: DC
  Administered 2019-12-14: 1 g via INTRAVENOUS
  Filled 2019-12-14: qty 1
  Filled 2019-12-14: qty 10

## 2019-12-14 MED ORDER — LACTATED RINGERS IV BOLUS
1000.0000 mL | Freq: Once | INTRAVENOUS | Status: AC
  Administered 2019-12-14: 1000 mL via INTRAVENOUS

## 2019-12-14 NOTE — Progress Notes (Signed)
[name] for Infectious Disease    Date of Admission:  12/11/2019     Reason for Consult: sepsis, ams    Referring Provider: Karie Kirks Primary Care Provider: [name]    Lines:  Peripheral IV's  Abx: 10/3-c piptazo  10/3-10/5 vancomycin 10/3 ceftriaxone        Assessment: 74 yo female intermittent asthma, combined chf, recent 10/2019 ptca, anxiety/fibromyalgia, gerd, chronic lorazepam admitted for sepsis in setting 2 days n/v/diarrhea. Of note had prior covid 06/2019 not intubated; received monoclonal ab but admitted and never intubated; had moderna vaccine series 1 a week prior to admission  Unfortunately blood cx obtained after abx given.   My Link Snuffer is as followed: She had had nausea prior and poor cardiac function. Not sore if this occurred again which potentially could explain n/v. The diarrhea is hard to explain. And then might have aspirated and developed aspiration pnuemonitis here with rising procalcitonin. So far diarrhea had stopped the day of admission, and now new cough with possible aspiration. Overall improving 2 days after febrile event started  Other possibilities: Some rodent exposure which could have diarrheal illness (nontyphoidal salmonellosis, typhoidal tularemia, cat bite fever, or plague). Of these possibilities, cat bite fever seems most fitting given the possible increased joint pain/LE pain the last few days; and diagnosis can be difficult. She is improving on betalactam. Legionella could be possibility but no significant respiratory sx or pneumonia. Culture negative endocarditis a consideration as she received abx prior to blood cx.   While procalcitonin is mildly elevated, this doesn't r/o a nonspecific viral process  Eitherway, given her poor reserve, I would continue empiric abx now until final cultures. I would switch to ceftriaxone and avoid volume load with the piptazo; I dont' see need for anaerobic coverage at this time.  vanc was stopped  I will discuss with her about finishing empiric rat bite fever course or watch off abx tomorrow  Plan: 1. Switch abx to ceftriaxone 1 gram daily 2. F/u final blood culture  3. If ongoing diarrhea or gi sx, I would consider sending respiratory viral PCR and full GI panel pcr  Principal Problem:   Sepsis due to undetermined organism [facility]) Active Problems:   Anxiety   Depression   GERD with esophagitis   Mixed diabetic hyperlipidemia associated with type 2 diabetes mellitus (HCC)   Elevated troponin   CAD S/P percutaneous coronary angioplasty   Nystagmus   Chronic combined systolic and diastolic congestive heart failure (HCC)   Type 2 diabetes mellitus (HCC)   Hyperammonemia (HCC)   Chest pain in adult   Acute respiratory failure with hypoxia (HCC)   Benzodiazepine withdrawal with complication (HCC)   Scheduled Meds: . aspirin  81 mg Oral Daily  . atorvastatin  80 mg Oral QHS  . DULoxetine  60 mg Oral Daily  . enoxaparin (LOVENOX) injection  40 mg Subcutaneous Q24H  . folic acid  1 mg Oral Daily  . LORazepam  0-4 mg Intravenous Q12H  . LORazepam  1 mg Intravenous Once  . LORazepam  1 mg Oral BID  . metoprolol tartrate  10 mg Intravenous Q8H  . mometasone-formoterol  2 puff Inhalation BID  . multivitamin with minerals  1 tablet Oral Daily  . pantoprazole (PROTONIX) IV  40 mg Intravenous Q24H  . potassium chloride SA  40 mEq Oral BID  . sacubitril-valsartan  1 tablet Oral BID  . thiamine  100 mg Oral Daily  Or  . thiamine  100 mg Intravenous Daily  . ticagrelor  90 mg Oral BID  . Vitamin D (Ergocalciferol)  50,000 Units Oral Q Sun   Continuous Infusions: . piperacillin-tazobactam (ZOSYN)  IV 3.375 g (12/14/19 1309)   PRN Meds:.acetaminophen **OR** acetaminophen, bismuth subsalicylate, fluticasone, levalbuterol, LORazepam **OR** LORazepam, morphine injection, ondansetron **OR** ondansetron (ZOFRAN) IV, oxyCODONE  HPI:   74 yo female intermittent  asthma, combined chf, recent 10/2019 ptca, anxiety/fibromyalgia, gerd, chronic lorazepam admitted for sepsis in setting 2 days n/v/diarrhea  Hx via patient/chart and patient's daughter Patient initially bib her daughter for 2 day n/v/diarrhea. Reported associated epigastric discomfort and rib cage pain with the nausea/vomiting. No cough/chest pain, urinary sx, flank pain, dyspnea, rash Reports 1 month bilateral legs aching; not sure if joints in LE more painful the last few days Diarrhea nonbloody and is watery Decreased oral intake 1-2 days and patient hasn't taken her meds   10/2019 admission for weakness/fatigue and intermittent nausea ended up with angiogram and ptca  Since release still feel poorly in terms of fatigue, generalized weakness [place] twice and thinks deconditioned related  Lives with a room mate who is not sick and no recent travel Patient no travel her self No exotic food or left over food consumption Does visit daughter (last visit a week ago). Daughter reports house infested with rodents. Again patient without direct insect/rodent bite and no rash. Question of ?increased LE pain the last few days  Had covid 06/2019 outpatient monoclonal abx, admitted afterward had remdesivir; not intubated. moderna series 1 of 2 a week prior to admission. Nonspecific flu like sx x2 days  Hospital course: 10/02 admission afebrile; tachycardic; hds Wbc 20 left shift; elevated lactate; normal procalcitonin; elevated troponin; abg showed compensated metabolic acidosis (no evidence dka) UA bland Empiric vanc/piptazo 10/03-04 fever; brain mri chronic microvascular changes; cxr bibasilar vascular congestion; procalcitonin rising to 0.9 10/03 Blood culture remains negative; ams improving 10/04 ongoing fever; mentation worse with fever; procalcitonin improving. TTE no obvious vegetation. N/v/diarrhea had stopped 10/05 afebrile, but soft sbp 90s; remains confused; improving wbc but lactate  elevated  Her nausea/vomiting had resolved the prior 24 hours. There is evidence of ongoing sirs/sepsis with fever (although last fever was 24 hours ago) and soft systolic blood pressure along with lactate. Based on her clinical evaluation and echo, doesn't appear to be in cardiogenic shock  Patient was previously on lorazepam which she took twice a day at home, and there is some concern for benzo withdrawal so has been on CIWA protocol  Current meds reviewed  Of note, patient reports significant adverse effect to benadryl. Given during prior ct contrast and caused AMS. She was given benadryl (according to daughter) in the ED this admission and developed AMS at that time. There is question of aspiration during these ams episodes. She wasn't altered at home  Reports some dry cough the last 1-2 days  Today no fever, ams resolved, but still feels poor appetite and overall weak  vanc stopped  Review of Systems: ROS Negative 11 point ros unless mentioned above  Past Medical History:  Diagnosis Date  . Anemia   . Anxiety   . Back pain   . CAD (coronary artery disease) 10/22/2019   [facility] to [facility] August 2021  . Colon polyps   . COVID-28 June 2019  . Endometriosis   . Fibromyalgia   . GERD with esophagitis    Follows with Dr. Fuller Plan with GI, EGD in the  past  . IBS (irritable bowel syndrome)   . Major depressive disorder   . Migraine   . Mild intermittent asthma   . Mixed diabetic hyperlipidemia associated with type 2 diabetes mellitus ([address])   . Obesity   . Type 2 diabetes mellitus ([place])   . Urinary incontinence     Social History   Tobacco Use  . Smoking status: Never Smoker  . Smokeless tobacco: Never Used  Substance Use Topics  . Alcohol use: Yes    Alcohol/week: 0.0 standard drinks    Comment: occasional  . Drug use: No    Family History  Problem Relation Age of Onset  . Hypertension Mother   . Heart failure Mother   . Diabetes Mother   . Hypertension Father    . Heart failure Father   . Diabetes Father   . Heart attack Father   . Stroke Father   . Cancer Brother        lung  . Thyroid disease Brother   . Cancer Brother        throat  . Cancer Paternal Aunt        bone  . Cancer Paternal Uncle        lung  . Hypertension Maternal Grandmother   . Hypertension Maternal Grandfather   . Hypertension Paternal Grandmother   . Hypertension Paternal Grandfather   . Diabetes Daughter   . Colon cancer Neg Hx   . Stomach cancer Neg Hx    Allergies  Allergen Reactions  . Fish-Derived Products Anaphylaxis  . Shellfish Allergy Shortness Of Breath    In dye for scan.  . Aspirin Nausea Only and Other (See Comments)    Pt stated it only gives her problems when she takes the high dose (325 mg)  . Ivp Dye [Iodinated Diagnostic Agents]     Hx of ? anaphlaxis  . Latex Other (See Comments)    Pt says she gets a "stinky infection"    OBJECTIVE: Blood pressure 113/70, pulse 100, temperature 99.7 F (37.6 C), temperature source Oral, resp. rate (!) 22, height 5\' 2"  (1.575 m), weight 70.1 kg, SpO2 91 %.  Physical Exam  Not on supplemental oxygen No distress; lying in bed; fully conversant HEENT: normocephalic; per; conj clear Neck supple cv rrr no mrg (tachy) Lungs clear; normal respiratory effort abd epigastric mild-mod tenderness including lower rib cage Ext cool/cold LE; no edema Skin no rash Neuro: cn2-12 intact; strength symmetric; LE strength 4-5/5; sensation intact light touch/temperature Psych: alert/oriented Back: no cva tenderness or spinous process tenderness  Lab Results 10/05 cbc 9/10/182 improved left shift; cr 0.88 10/05 lactate 4.6 --> 2.3 10/05 procalcitonin 0.79 10/04 procalcitonin 0.94 10/04 UA trace LE, small Hb, 50 glucose, rare bacteria, 0-5 rbc or wbc; uds benzo 10/03 ABG 7.36/91/35 10/03 wbc 18 (left shift); lft 64/30/48/1.1 10/02 cbc 20/13/331 (no diff); cr 0.9; lft 32/21/63/1.3 10/03 lactate 1.8 10/02 lactate  3.4 10/02 procalcitonin <0.10 10/02-10/3 trop I 218 --> 721  Lab Results  Component Value Date   WBC 9.3 12/14/2019   HGB 10.0 (L) 12/14/2019   HCT 30.5 (L) 12/14/2019   MCV 95.3 12/14/2019   PLT 182 12/14/2019    Lab Results  Component Value Date   CREATININE 0.88 12/14/2019   BUN 10 12/14/2019   NA 139 12/14/2019   K 5.0 12/14/2019   CL 108 12/14/2019   CO2 17 (L) 12/14/2019    Lab Results  Component Value Date   ALT 30  12/13/2019   AST 64 (H) 12/13/2019   ALKPHOS 48 12/13/2019   BILITOT 1.1 12/13/2019     Microbiology: Recent Results (from the past 240 hour(s))  Respiratory Panel by RT PCR (Flu A&B, Covid) - Nasopharyngeal Swab     Status: None   Collection Time: 12/11/19 10:54 PM   Specimen: Nasopharyngeal Swab  Result Value Ref Range Status   SARS Coronavirus 2 by RT PCR NEGATIVE NEGATIVE Final    Comment: (NOTE) SARS-CoV-2 target nucleic acids are NOT DETECTED.  The SARS-CoV-2 RNA is generally detectable in upper respiratoy specimens during the acute phase of infection. The lowest concentration of SARS-CoV-2 viral copies this assay can detect is 131 copies/mL. A negative result does not preclude SARS-Cov-2 infection and should not be used as the sole basis for treatment or other patient management decisions. A negative result may occur with  improper specimen collection/handling, submission of specimen other than nasopharyngeal swab, presence of viral mutation(s) within the areas targeted by this assay, and inadequate number of viral copies (<131 copies/mL). A negative result must be combined with clinical observations, patient history, and epidemiological information. The expected result is Negative.  Fact Sheet for Patients:  PinkCheek.be  Fact Sheet for Healthcare Providers:  GravelBags.it  This test is no t yet approved or cleared by the [place] FDA and  has been authorized for  detection and/or diagnosis of SARS-CoV-2 by FDA under an Emergency Use Authorization (EUA). This EUA will remain  in effect (meaning this test can be used) for the duration of the COVID-19 declaration under Section 564(b)(1) of the Act, 21 U.S.C. section 360bbb-3(b)(1), unless the authorization is terminated or revoked sooner.     Influenza A by PCR NEGATIVE NEGATIVE Final   Influenza B by PCR NEGATIVE NEGATIVE Final    Comment: (NOTE) The Xpert Xpress SARS-CoV-2/FLU/RSV assay is intended as an aid in  the diagnosis of influenza from Nasopharyngeal swab specimens and  should not be used as a sole basis for treatment. Nasal washings and  aspirates are unacceptable for Xpert Xpress SARS-CoV-2/FLU/RSV  testing.  Fact Sheet for Patients: PinkCheek.be  Fact Sheet for Healthcare Providers: GravelBags.it  This test is not yet approved or cleared by the [place] FDA and  has been authorized for detection and/or diagnosis of SARS-CoV-2 by  FDA under an Emergency Use Authorization (EUA). This EUA will remain  in effect (meaning this test can be used) for the duration of the  Covid-19 declaration under Section 564(b)(1) of the Act, 21  U.S.C. section 360bbb-3(b)(1), unless the authorization is  terminated or revoked. Performed at [facility], [place] [address], [place], [place] [address]   Culture, blood (routine x 2)     Status: None (Preliminary result)   Collection Time: 12/12/19  3:06 PM   Specimen: BLOOD  Result Value Ref Range Status   Specimen Description BLOOD LEFT ANTECUBITAL  Final   Special Requests   Final    BOTTLES DRAWN AEROBIC AND ANAEROBIC Blood Culture results may not be optimal due to an inadequate volume of blood received in culture bottles   Culture   Final    NO GROWTH 2 DAYS Performed at [facility], [place] [address], [place], [place] [address]    Report Status PENDING  Incomplete  Culture,  blood (routine x 2)     Status: None (Preliminary result)   Collection Time: 12/12/19  3:12 PM   Specimen: BLOOD RIGHT HAND  Result Value Ref Range Status  Specimen Description BLOOD RIGHT HAND  Final   Special Requests   Final    BOTTLES DRAWN AEROBIC ONLY Blood Culture results may not be optimal due to an inadequate volume of blood received in culture bottles   Culture   Final    NO GROWTH 2 DAYS Performed at [facility], [place] [address], [place], [place] [address]    Report Status PENDING  Incomplete  MRSA PCR Screening     Status: None   Collection Time: 12/13/19  6:20 AM   Specimen: Nasopharyngeal  Result Value Ref Range Status   MRSA by PCR NEGATIVE NEGATIVE Final    Comment:        The GeneXpert MRSA Assay (FDA approved for NASAL specimens only), is one component of a comprehensive MRSA colonization surveillance program. It is not intended to diagnose MRSA infection nor to guide or monitor treatment for MRSA infections. Performed at [facility], [place] [address], [place], [place] [address]    10/03 blood cx negative  Serology: 10/02 covid/flu A-B negative  Imaging: 10/05 head ct no contrast 1. No acute intracranial abnormality. 2. Age related atrophy.  10/04 TTE limited 1. EF similar to August diffuse hypokinesis worse in the septum , apex and inferior walls Basal lateral wall only area with preserved function . Left ventricular ejection fraction, by estimation, is 25 to 30%. The left ventricle has severely decreased function. The left ventricle demonstrates regional wall motion abnormalities (see scoring diagram/findings for description). The left ventricular internal cavity size was moderately dilated. Left ventricular diastolic parameters were normal. 2. Right ventricular systolic function was not well visualized. The right ventricular size is not well visualized. There is mildly elevated pulmonary artery systolic pressure. 3. The mitral valve is  normal in structure. Trivial mitral valve regurgitation. No evidence of mitral stenosis. 4. Tricuspid valve regurgitation is moderate. 5. The aortic valve is normal in structure. Aortic valve regurgitation is not visualized. Mild to moderate aortic valve sclerosis/calcification is present, without any evidence of aortic stenosis. 6. The inferior vena cava is normal in size with greater than 50% respiratory variability, suggesting right atrial pressure of 3 mmHg.  10/04 portable cxr: stable  10/03 MR brain without contrast 1. No acute intracranial abnormality. 2. Multifocal hyperintense T2-weighted signal within the white matter, nonspecific but most commonly due to chronic small vessel ischemia.  10/03 portable cxr Mild pulmonary vascular congestion and mild bibasilar interstitial densities, query early interstitial edema.  Jabier Mutton, [place] for Infectious [place] [phone] pager    12/14/2019, 3:09 PM

## 2019-12-14 NOTE — Progress Notes (Addendum)
   12/13/19 2116  Vitals  BP 94/66  MAP (mmHg) 76  BP Location Right Arm  BP Method Automatic  Patient Position (if appropriate) Lying  Pulse Rate Source Monitor  ECG Heart Rate (!) 126  Resp 20  MEWS COLOR  MEWS Score Color Red  Oxygen Therapy  SpO2 91 %  O2 Device Nasal Cannula  O2 Flow Rate (L/min) 3 L/min  MEWS Score  MEWS Temp 1  MEWS Systolic 1  MEWS Pulse 2  MEWS RR 0  MEWS LOC 0  MEWS Score 4  [place] MD paged, see new orders

## 2019-12-14 NOTE — Progress Notes (Signed)
PROGRESS NOTE  Nicole [name] AUQ:[id number] DOB: December 12, 1945 DOA: 12/11/2019 PCP: Nicole Bowen, MD  Brief History   Nicole [name] is a 74 y.o. female with medical history significant of anemia, anxiety, depression, chronic back pain, bloating, occasional blurred vision, colon polyps, dizziness, history endometriosis, fatigue, fibromyalgia, memory difficulties, GERD, headache/migraine, arthralgias, lower extremity weakness, mild intermittent asthma, hyperlipidemia, overweight, type II DM, urinary incontinence, vaginitis who was brought to the emergency department by her daughter due to nausea, vomiting, diarrhea and dizziness since Thursday.  She denies melena or hematochezia.  No dysuria, frequency or hematuria.  She also complains of substernal/epigastric discomfort and palpitations.  She denies PND, orthopnea or recent pitting edema of the lower extremities.  No fever, chills, but stated she had some night sweats.  She denies dyspnea, productive cough, wheezing or hemoptysis.  She denies polyuria, polydipsia, polyphagia or blurred vision.  The patient also has not taken her medications in the last 2+ days.  She mentioned that she last took them on Thursday morning.  ED Course: Initial vital signs were temperature EMS gave the patient 500 mL of NS bolus and 4 mg of ondansetron IVP.  CBC shows a white count 20.3, hemoglobin 13.3 g/dL and platelets 331.Troponin was 218 and then 377 ng/L.  LFTs show a mildly increased bilirubin at 1.3 mg/dL, but were otherwise normal. BMP shows normal sodium, potassium, chloride and renal function.  CO2 was 20 mmol/L.  Her anion gap was 14.  Glucose is 254 and calcium 7.3 mg/dL.Ammonia level was 55.  TSH was normal.  Lactic acid 3.4 mmol/L.  D-dimer was 0.77.  Initially, her chest radiograph did not have any acute abnormality.  There was no effusion and cardiomediastinal contours are stable.  CT head and MRI of brain did not show any acute intracranial abnormality.     On the morning of 12/12/2019 the patient was tchypneic in the 20's, HR was elevated in the 120's. She was requiring 2L O2 to maintain SaO2 in the mid nineties.  Overnight the patient's elevated troponin increased some more. EKG was repeated and demonstrated lateral ST segment depressions.   She also had become agitated.   Due to her nausea and vomiting the patient had not taken her medications for at least 3 days. The patient is on DAPT for a stent placed in August. She is also on lorazepam which she takes twice a day.   Cardiology was consulted due to abnormal EKG, elevated troponins, and history of DAPT for stent placed in august. She was started on a heparin drip and restarted on her DAPT and CHF medications by cardiology. Echocardiogram was obtained. It demonstrated diffuse worsening of hypokinesis particularly in the septum. LVEF was 25-30% with severely decreased function by the left ventricle. LV was moderately dilated in size. There was normal diastolic parameters. RV was function was not well visualized and there was mildly elevated pulmonary artery systolic pressure. The patient has stated that she will refuse to return to the cath lab for any reason due to her allergy to IV contrast. I appreciate their help.  The patient has been placed on a CIWA protocol out of concern for benzodiazepine withdrawal.  She also has severe hypomagnesemia as well as hypokalemia. These are being supplemented and followed.  She also has a metabolic acidosis. Beta hydroxy butyric acid is negative. This was addressed with IV sodium bicarbonate. This has been stopped as acidosis has resolved.   She has developed a fever of 103. Blood cultures x  2 have been ordered. She has been started on zosyn and vancomycin. PCR for MRSA is negative, so vancomycin has been discontinued.   ABG 12/12/2019 demonstrates pH of 7.357, PCO2 of 34.8, PaO2 of 82.2, and SaO2 of 91.1. Bicarbonate of 18.3. This was obtained on 6L. She  received Sodium bicarbonate, acidosis has resolved.  On 12/13/2019 The patient's mental status had completely cleared and she was doing well. No resumption of nausea/vomiting/diarrhea. That afternoon she again had a high fever followed by altered mental status again. Blood cultures x 2 were obtained. Lactic acid was negative then, but is again elevated this morning.  Infectious disease has been consulted.  Consultants  . Cardiology . PCCM . Infectious Disease  Procedures  . None  Antibiotics   Anti-infectives (From admission, onward)   Start     Dose/Rate Route Frequency Ordered Stop   12/13/19 1500  vancomycin (VANCOREADY) IVPB 1250 mg/250 mL  Status:  Discontinued        1,250 mg 166.7 mL/hr over 90 Minutes Intravenous Every 24 hours 12/12/19 1450 12/13/19 1321   12/12/19 1500  vancomycin (VANCOREADY) IVPB 1500 mg/300 mL        1,500 mg 150 mL/hr over 120 Minutes Intravenous  Once 12/12/19 1449 12/12/19 1736   12/12/19 1445  piperacillin-tazobactam (ZOSYN) IVPB 3.375 g        3.375 g 12.5 mL/hr over 240 Minutes Intravenous Every 8 hours 12/12/19 1438     12/12/19 0330  cefTRIAXone (ROCEPHIN) 2 g in sodium chloride 0.9 % 100 mL IVPB  Status:  Discontinued        2 g 200 mL/hr over 30 Minutes Intravenous Every 24 hours 12/12/19 0329 12/12/19 1450   12/12/19 0330  metroNIDAZOLE (FLAGYL) IVPB 500 mg  Status:  Discontinued        500 mg 100 mL/hr over 60 Minutes Intravenous Every 8 hours 12/12/19 0329 12/14/19 1142     Subjective  The patient is again lethargic and confused. She again appears acutely ill.  Objective   Vitals:  Vitals:   12/14/19 1100 12/14/19 1106  BP: 94/71 113/70  Pulse: 100 100  Resp: (!) 22 (!) 22  Temp: 99.7 F (37.6 C) 99.7 F (37.6 C)  SpO2: 91% 91%   Exam:  Constitutional:  . The patient is lethargic confused. No acute distress. Respiratory:  . No increased work of breathing. . No wheezes, rales, or rhonchi . No tactile  fremitus Cardiovascular:  . Regular rate and rhythm. . No murmurs, ectopy, or gallups. . No lateral PMI. No thrills. Abdomen:  . Abdomen is soft, non-tender, non-distended . No hernias, masses, or organomegaly . Normoactive bowel sounds.  Musculoskeletal:  . No cyanosis, clubbing, or edema Skin:  . No rashes, lesions, ulcers . palpation of skin: no induration or nodules Neurologic:  Patient is confused and lethargic. She is not able to participate in evaluation. Psychiatric:  . Patient is confused and lethargic. She is not able to participate in evaluation.  I have personally reviewed the following:   Today's Data  . Vitals, Lactic acid, CBC  Micro Data  . Blood cultures x 2: No growth  Imaging  . CXR: No acute findings  Cardiology Data  . EKG, x 3.  Scheduled Meds: . aspirin  81 mg Oral Daily  . atorvastatin  80 mg Oral QHS  . DULoxetine  60 mg Oral Daily  . enoxaparin (LOVENOX) injection  40 mg Subcutaneous Q24H  . folic acid  1 mg Oral  Daily  . LORazepam  0-4 mg Intravenous Q12H  . LORazepam  1 mg Intravenous Once  . LORazepam  1 mg Oral BID  . metoprolol tartrate  10 mg Intravenous Q8H  . mometasone-formoterol  2 puff Inhalation BID  . multivitamin with minerals  1 tablet Oral Daily  . pantoprazole (PROTONIX) IV  40 mg Intravenous Q24H  . potassium chloride SA  40 mEq Oral BID  . sacubitril-valsartan  1 tablet Oral BID  . thiamine  100 mg Oral Daily   Or  . thiamine  100 mg Intravenous Daily  . ticagrelor  90 mg Oral BID  . Vitamin D (Ergocalciferol)  50,000 Units Oral Q Sun   Continuous Infusions: . piperacillin-tazobactam (ZOSYN)  IV 3.375 g (12/14/19 1309)    Principal Problem:   Sepsis due to undetermined organism [facility]) Active Problems:   Anxiety   Depression   GERD with esophagitis   Mixed diabetic hyperlipidemia associated with type 2 diabetes mellitus (HCC)   Elevated troponin   CAD S/P percutaneous coronary angioplasty   Nystagmus    Chronic combined systolic and diastolic congestive heart failure (HCC)   Type 2 diabetes mellitus (HCC)   Hyperammonemia (HCC)   Chest pain in adult   Acute respiratory failure with hypoxia ([place])   Benzodiazepine withdrawal with complication ([place])   LOS: 2 days   A & P  Sepsis: Recurrent with tachycardia, tachypnea, and hypotention on the night of 07/13/2019 and am of 12/14/2019. She is also again confused on this morning. Lactic acid is elevated at 2.3. She received another 1L bolus of LR early this morning.On 12/12/2019 the patient developed fever, tachycardia, tachypnea, worsening leukocytosis, altered mental status and hypoxia.  She has been started on zosyn, flagyl, and vancomycin. Vancomycin was discontinued when MRSA returned negative. Blood cultures have had no growth. Infectious disease has been consulted.  Metabolic acidosis: Resolved. Due to sepsis: Sodium bicarbonate infusion stopped. Monitor. BMP pending.  Electrolyte abnormalities: Resolved. Mg on 12/12/2019 was less than 0.2. This was supplemented with IV magnesium sulfate. Potassium was also low at 3.4. This was supplemented. Repeat magnesium was 1.1 and repeat potassium was 3.0. Both were again supplemented. Monitor closely and continue to supplement as necessary. BMP pending.  Chest pain: The patient complained of epigastric/substernal chest discomfort. Her troponins were elevated at 218 upon presentation. This was increased to 377 5 hours later and then 721 at 0330 am. This was repeated later in the morning and troponin had decreased to 694. EKG demonstrated tachycardia and lateral ischemia that was new. Cardiology was consulted.   CAD: The patient had stent placed in the first week of August 2021. Due to her nausea/vomiting/and diarrhea she has not taken any of her medications for the past 3-4 days. She was started on a heparin drip and restarted on her DAPT and CHF medications by cardiology. Echocardiogram was obtained. It  demonstrated diffuse worsening of hypokinesis particularly in the septum. LVEF was 25-30% with severely decreased function by the left ventricle. LV was moderately dilated in size. There was normal diastolic parameters. RV was function was not well visualized and there was mildly elevated pulmonary artery systolic pressure. The patient has stated that she will refuse to return to the cath lab for any reason due to her allergy to IV contrast. I appreciate their help. LR discontinued at their recommendation.  Prolonged QT: Likely related to the patient's acidosis and electrolyte disturbances. Avoid QT prolonging medications. Follow EKG.  Acute on chronic combined systolic  and diastolic heart failure:Echocardiogram was obtained. It demonstrated diffuse worsening of hypokinesis particularly in the septum. LVEF was 25-30% with severely decreased function by the left ventricle. LV was moderately dilated in size. There was normal diastolic parameters. RV was function was not well visualized and there was mildly elevated pulmonary artery systolic pressure. The patient is currently 2L positive in terms of Fluid balance. LR stopped.  Nausea, vomiting, abdominal pain: The patient had demonstrated epigastric discomfort but no nausea or vomiting since her presentation to [facility]. There has also been no diarrhea. She states that she did have some dry heaves in the ED yesterday.  She will be started on a clear liquid diet.  An attempt was made to further elucidate the causes for her complaints with a CT of the abdomen and pelvis with contrast. The patient refused CT as well as prophylaxis for IV contrast dye. Stool will be sent for GI pathogen panel should she produce one.  Delirium: Recurrent. Due to sepsis in the setting of benzodiazepine withdrawal. The patient is on a CIWA protocol for possible benzodiazepine withdrawal.  I have seen and examined this patient myself. I have spent 38 minutes in the care of this  patient.  DVT prophylaxis: Enoxaparin CODE STATUIS: Full Code Family Communication: Daughter is at bedside Disposition: The patient has been transferred from the ED to the progressive unit Status is: Inpatient  Remains inpatient appropriate because:IV treatments appropriate due to intensity of illness or inability to take PO  Dispo: The patient is from: Home              Anticipated d/c is to: Home              Anticipated d/c date is: > 3 days              Patient currently is not medically stable to d/c.   Severity of Illness: The appropriate patient status for this patient is INPATIENT. Inpatient status is judged to be reasonable and necessary in order to provide the required intensity of service to ensure the patient's safety. The patient's presenting symptoms, physical exam findings, and initial radiographic and laboratory data in the context of their chronic comorbidities is felt to place them at high risk for further clinical deterioration. Furthermore, it is not anticipated that the patient will be medically stable for discharge from the hospital within 2 midnights of admission. The following factors support the patient status of inpatient.   " The patient's presenting symptoms include delirium. " The worrisome physical exam findings include Abdominal pain. " The initial radiographic and laboratory data are worrisome because of Normal appearance of abdomen on x-ray. . " The chronic co-morbidities include CAD.   * I certify that at the point of admission it is my clinical judgment that the patient will require inpatient hospital care spanning beyond 2 midnights from the point of admission due to high intensity of service, high risk for further deterioration and high frequency of surveillance required.*  Nicole Keiffer, DO [facility] Direct contact: see [email or web address]  7PM-7AM contact night coverage as above 12/14/2019, 3:10 PM  LOS: 0 days

## 2019-12-14 NOTE — Progress Notes (Signed)
DAILY PROGRESS NOTE   Patient Name: Nicole [name] Date of Encounter: 12/14/2019 Cardiologist: Peter Martinique, MD  Chief Complaint   Sedated today, not following commands  Patient Profile   Nicole [name] is a 74 y.o. female with a history of type 2 diabetes mellitus, hyperlipidemia, IBS, COVID-19 in April 2021 and CAD status post DES to the [place] in August 2021 who is being seen today for the evaluation of abnormal ECG and cardiac enzymes at the request of Dr. Benny Lennert.  Subjective   AMS overnight - head CT showed age-related atrophy. Today she has persistent altered mental status, not following commands, eyes rolled back in her head - barely arousable. EKG's yesterday and today show some mild improvement in TWI's, not as pronounced, but still present. Limited echo yesterday shows LVEF 25-30% with regional WMA's - stable compared to prior study in August 2021. Apparently heparin was not started - however, troponins remained stabilely elevated. She is on DAPT. Concerned about IVF's - on LR at 75/hr, now +2L positive today.   Objective   Vitals:   12/14/19 0801 12/14/19 0850 12/14/19 1100 12/14/19 1106  BP: 122/72 122/72 94/71 113/70  Pulse: (!) 110 (!) 110 100 100  Resp: 20  (!) 22 (!) 22  Temp: 99.4 F (37.4 C)  99.7 F (37.6 C) 99.7 F (37.6 C)  TempSrc: Oral  Oral Oral  SpO2: 92%  91% 91%  Weight:      Height:        Intake/Output Summary (Last 24 hours) at 12/14/2019 1216 Last data filed at 12/14/2019 0086 Gross per 24 hour  Intake 2692.74 ml  Output 500 ml  Net 2192.74 ml   Filed Weights   12/11/19 2037  Weight: 70.1 kg    Physical Exam   General appearance: alert and no distress Neck: no carotid bruit, no JVD and thyroid not enlarged, symmetric, no tenderness/mass/nodules Lungs: clear to auscultation bilaterally Heart: regular rate and rhythm Abdomen: soft, non-tender; bowel sounds normal; no masses,  no organomegaly Extremities: extremities normal,  atraumatic, no cyanosis or edema Pulses: 2+ and symmetric Skin: Skin color, texture, turgor normal. No rashes or lesions Neurologic: Mental status: Alert, oriented, thought content appropriate Psych: Pleasant  Inpatient Medications    Scheduled Meds:  aspirin  81 mg Oral Daily   atorvastatin  80 mg Oral QHS   DULoxetine  60 mg Oral Daily   enoxaparin (LOVENOX) injection  40 mg Subcutaneous P61P   folic acid  1 mg Oral Daily   LORazepam  0-4 mg Intravenous Q6H   Followed by   LORazepam  0-4 mg Intravenous Q12H   LORazepam  1 mg Intravenous Once   LORazepam  1 mg Oral BID   metoprolol tartrate  10 mg Intravenous Q8H   mometasone-formoterol  2 puff Inhalation BID   multivitamin with minerals  1 tablet Oral Daily   pantoprazole (PROTONIX) IV  40 mg Intravenous Q24H   potassium chloride SA  40 mEq Oral BID   sacubitril-valsartan  1 tablet Oral BID   thiamine  100 mg Oral Daily   Or   thiamine  100 mg Intravenous Daily   ticagrelor  90 mg Oral BID   Vitamin D (Ergocalciferol)  50,000 Units Oral Q Sun    Continuous Infusions:  lactated ringers 75 mL/hr at 12/14/19 0900   piperacillin-tazobactam (ZOSYN)  IV 3.375 g (12/14/19 5093)    PRN Meds: acetaminophen **OR** acetaminophen, bismuth subsalicylate, fluticasone, levalbuterol, LORazepam **OR** LORazepam, morphine injection,  ondansetron **OR** ondansetron (ZOFRAN) IV, oxyCODONE   Labs   Results for orders placed or performed during the hospital encounter of 12/11/19 (from the past 48 hour(s))  Troponin I (High Sensitivity)     Status: Abnormal   Collection Time: 12/12/19 12:41 PM  Result Value Ref Range   Troponin I (High Sensitivity) 642 (HH) <18 ng/L    Comment: CRITICAL VALUE NOTED.  VALUE IS CONSISTENT WITH PREVIOUSLY REPORTED AND CALLED VALUE. (NOTE) Elevated high sensitivity troponin I (hsTnI) values and significant  changes across serial measurements may suggest ACS but many other  chronic and  acute conditions are known to elevate hsTnI results.  Refer to the Links section for chest pain algorithms and additional  guidance. Performed at [facility], [place] [address], [place], [place] [address]   Blood gas, arterial     Status: Abnormal   Collection Time: 12/12/19  2:30 PM  Result Value Ref Range   FIO2 36.00    pH, Arterial 7.357 7.35 - 7.45   pCO2 arterial 34.8 32 - 48 mmHg   pO2, Arterial 82.2 (L) 83 - 108 mmHg   Bicarbonate 18.3 (L) 20.0 - 28.0 mmol/L   Acid-base deficit 5.6 (H) 0.0 - 2.0 mmol/L   O2 Saturation 91.1 %   Patient temperature 39.9    Collection site RIGHT RADIAL    Drawn by [phone]    Sample type ARTERIAL DRAW    Allens test (pass/fail) PASS PASS    Comment: Performed at [facility], [place] [address], [place], [place] [address]  Culture, blood (routine x 2)     Status: None (Preliminary result)   Collection Time: 12/12/19  3:06 PM   Specimen: BLOOD  Result Value Ref Range   Specimen Description BLOOD LEFT ANTECUBITAL    Special Requests      BOTTLES DRAWN AEROBIC AND ANAEROBIC Blood Culture results may not be optimal due to an inadequate volume of blood received in culture bottles   Culture      NO GROWTH 2 DAYS Performed at [facility] [address] [place], [place] [address]    Report Status PENDING   Glucose, capillary     Status: Abnormal   Collection Time: 12/12/19  3:06 PM  Result Value Ref Range   Glucose-Capillary 191 (H) 70 - 99 mg/dL    Comment: Glucose reference range applies only to samples taken after fasting for at least 8 hours.  Comprehensive metabolic panel     Status: Abnormal   Collection Time: 12/12/19  3:07 PM  Result Value Ref Range   Sodium 136 135 - 145 mmol/L   Potassium 3.5 3.5 - 5.1 mmol/L   Chloride 103 98 - 111 mmol/L   CO2 21 (L) 22 - 32 mmol/L   Glucose, Bld 349 (H) 70 - 99 mg/dL    Comment: Glucose reference range applies only to samples taken after fasting for at least 8 hours.   BUN 7 (L) 8  - 23 mg/dL   Creatinine, Ser 0.96 0.44 - 1.00 mg/dL   Calcium 6.3 (LL) 8.9 - 10.3 mg/dL    Comment: CRITICAL RESULT CALLED TO, READ BACK BY AND VERIFIED WITH: RN A RUDD AT 1601 12/12/19 BY L BENFIELD    Total Protein 5.9 (L) 6.5 - 8.1 g/dL   Albumin 2.9 (L) 3.5 - 5.0 g/dL   AST 60 (H) 15 - 41 U/L   ALT 26 0 - 44 U/L   Alkaline Phosphatase 52  38 - 126 U/L   Total Bilirubin 1.0 0.3 - 1.2 mg/dL   GFR calc non Af Amer 59 (L) >60 mL/min   GFR calc Af Amer >60 >60 mL/min   Anion gap 12 5 - 15    Comment: Performed at [facility] [address] [place], [place] [address]  Magnesium     Status: Abnormal   Collection Time: 12/12/19  3:07 PM  Result Value Ref Range   Magnesium 3.8 (H) 1.7 - 2.4 mg/dL    Comment: Performed at [facility] [address], [place], [place] [address]  Culture, blood (routine x 2)     Status: None (Preliminary result)   Collection Time: 12/12/19  3:12 PM   Specimen: BLOOD RIGHT HAND  Result Value Ref Range   Specimen Description BLOOD RIGHT HAND    Special Requests      BOTTLES DRAWN AEROBIC ONLY Blood Culture results may not be optimal due to an inadequate volume of blood received in culture bottles   Culture      NO GROWTH 2 DAYS Performed at [facility], [place] [address], [place], [place] [address]    Report Status PENDING   D-dimer, quantitative (not at [facility])     Status: Abnormal   Collection Time: 12/12/19  6:38 PM  Result Value Ref Range   D-Dimer, Quant 0.99 (H) 0.00 - 0.50 ug/mL-FEU    Comment: (NOTE) At the manufacturer cut-off of 0.50 ug/mL FEU, this assay has been documented to exclude PE with a sensitivity and negative predictive value of 97 to 99%.  At this time, this assay has not been approved by the FDA to exclude DVT/VTE. Results should be correlated with clinical presentation. Performed at [facility], [place] [address], [place], [place] [address]   Glucose, capillary     Status: Abnormal   Collection Time:  12/12/19  9:45 PM  Result Value Ref Range   Glucose-Capillary 186 (H) 70 - 99 mg/dL    Comment: Glucose reference range applies only to samples taken after fasting for at least 8 hours.  CBC with Differential     Status: Abnormal   Collection Time: 12/13/19  1:25 AM  Result Value Ref Range   WBC 13.8 (H) 4.0 - 10.5 K/uL   RBC 3.51 (L) 3.87 - 5.11 MIL/uL   Hemoglobin 10.9 (L) 12.0 - 15.0 g/dL   HCT 32.5 (L) 36 - 46 %   MCV 92.6 80.0 - 100.0 fL   MCH 31.1 26.0 - 34.0 pg   MCHC 33.5 30.0 - 36.0 g/dL   RDW 14.5 11.5 - 15.5 %   Platelets 185 150 - 400 K/uL   nRBC 0.0 0.0 - 0.2 %   Neutrophils Relative % 90 %   Neutro Abs 12.5 (H) 1.7 - 7.7 K/uL   Lymphocytes Relative 5 %   Lymphs Abs 0.7 0.7 - 4.0 K/uL   Monocytes Relative 3 %   Monocytes Absolute 0.4 0 - 1 K/uL   Eosinophils Relative 0 %   Eosinophils Absolute 0.0 0 - 0 K/uL   Basophils Relative 0 %   Basophils Absolute 0.0 0 - 0 K/uL   Immature Granulocytes 2 %   Abs Immature Granulocytes 0.20 (H) 0.00 - 0.07 K/uL    Comment: Performed at [facility], [address], [place], [place] [address]  Comprehensive metabolic panel     Status: Abnormal   Collection Time: 12/13/19  1:25 AM  Result Value Ref Range   Sodium 142 135 - 145 mmol/L   Potassium 4.5 3.5 - 5.1 mmol/L   Chloride 112 (H) 98 - 111 mmol/L   CO2 22 22 - 32 mmol/L   Glucose, Bld 193 (H) 70 - 99 mg/dL    Comment: Glucose reference range applies only to samples taken after fasting for at least 8 hours.   BUN 8 8 - 23 mg/dL   Creatinine, Ser 0.95 0.44 - 1.00 mg/dL   Calcium 7.6 (L) 8.9 - 10.3 mg/dL   Total Protein 6.0 (L) 6.5 - 8.1 g/dL   Albumin 2.9 (L) 3.5 - 5.0 g/dL   AST 64 (H) 15 - 41 U/L   ALT 30 0 - 44 U/L   Alkaline Phosphatase 48 38 - 126 U/L   Total Bilirubin 1.1 0.3 - 1.2 mg/dL   GFR calc non Af Amer 60 (L) >60 mL/min   GFR calc Af Amer >60 >60 mL/min   Anion gap 8 5 - 15    Comment: Performed at [facility] [address],  [place], [place] [address]  Procalcitonin     Status: None   Collection Time: 12/13/19  1:25 AM  Result Value Ref Range   Procalcitonin 0.94 ng/mL    Comment:        Interpretation: PCT > 0.5 ng/mL and <= 2 ng/mL: Systemic infection (sepsis) is possible, but other conditions are known to elevate PCT as well. (NOTE)       Sepsis PCT Algorithm           Lower Respiratory Tract                                      Infection PCT Algorithm    ----------------------------     ----------------------------         PCT < 0.25 ng/mL                PCT < 0.10 ng/mL          Strongly encourage             Strongly discourage   discontinuation of antibiotics    initiation of antibiotics    ----------------------------     -----------------------------       PCT 0.25 - 0.50 ng/mL            PCT 0.10 - 0.25 ng/mL               OR       >80% decrease in PCT            Discourage initiation of                                            antibiotics      Encourage discontinuation           of antibiotics    ----------------------------     -----------------------------         PCT >= 0.50 ng/mL              PCT 0.26 - 0.50 ng/mL                AND       <80% decrease in PCT  Encourage initiation of                                             antibiotics       Encourage continuation           of antibiotics    ----------------------------     -----------------------------        PCT >= 0.50 ng/mL                  PCT > 0.50 ng/mL               AND         increase in PCT                  Strongly encourage                                      initiation of antibiotics    Strongly encourage escalation           of antibiotics                                     -----------------------------                                           PCT <= 0.25 ng/mL                                                 OR                                        > 80% decrease in PCT                                       Discontinue / Do not initiate                                             antibiotics  Performed at [facility], [address], [place], [place] [address]   MRSA PCR Screening     Status: None   Collection Time: 12/13/19  6:20 AM   Specimen: Nasopharyngeal  Result Value Ref Range   MRSA by PCR NEGATIVE NEGATIVE    Comment:        The GeneXpert MRSA Assay (FDA approved for NASAL specimens only), is one component of a comprehensive MRSA colonization surveillance program. It is not intended to diagnose MRSA infection nor to guide or monitor treatment for MRSA infections. Performed at [facility], [place] [address], [place], [place] [address]   Glucose, capillary     Status: Abnormal   Collection Time:  12/13/19  6:34 AM  Result Value Ref Range   Glucose-Capillary 156 (H) 70 - 99 mg/dL    Comment: Glucose reference range applies only to samples taken after fasting for at least 8 hours.  Urinalysis, Routine w reflex microscopic     Status: Abnormal   Collection Time: 12/13/19  6:39 AM  Result Value Ref Range   Color, Urine YELLOW YELLOW   APPearance CLEAR CLEAR   Specific Gravity, Urine 1.014 1.005 - 1.030   pH 5.0 5.0 - 8.0   Glucose, UA 50 (A) NEGATIVE mg/dL   Hgb urine dipstick SMALL (A) NEGATIVE   Bilirubin Urine NEGATIVE NEGATIVE   Ketones, ur NEGATIVE NEGATIVE mg/dL   Protein, ur NEGATIVE NEGATIVE mg/dL   Nitrite NEGATIVE NEGATIVE   Leukocytes,Ua TRACE (A) NEGATIVE   RBC / HPF 0-5 0 - 5 RBC/hpf   WBC, UA 0-5 0 - 5 WBC/hpf   Bacteria, UA RARE (A) NONE SEEN   Squamous Epithelial / LPF 0-5 0 - 5    Comment: Performed at [facility] [address], [place], [place] [address]  Urine rapid drug screen (hosp performed)     Status: Abnormal   Collection Time: 12/13/19  6:39 AM  Result Value Ref Range   Opiates NONE DETECTED NONE DETECTED   Cocaine NONE DETECTED NONE DETECTED   Benzodiazepines POSITIVE (A) NONE DETECTED   Amphetamines NONE  DETECTED NONE DETECTED   Tetrahydrocannabinol NONE DETECTED NONE DETECTED   Barbiturates NONE DETECTED NONE DETECTED    Comment: (NOTE) DRUG SCREEN FOR MEDICAL PURPOSES ONLY.  IF CONFIRMATION IS NEEDED FOR ANY PURPOSE, NOTIFY LAB WITHIN 5 DAYS.  LOWEST DETECTABLE LIMITS FOR URINE DRUG SCREEN Drug Class                     Cutoff (ng/mL) Amphetamine and metabolites    1000 Barbiturate and metabolites    200 Benzodiazepine                 277 Tricyclics and metabolites     300 Opiates and metabolites        300 Cocaine and metabolites        300 THC                            50 Performed at [facility], [place] [address], [place], [place] [address]   Magnesium     Status: None   Collection Time: 12/13/19 10:19 AM  Result Value Ref Range   Magnesium 2.1 1.7 - 2.4 mg/dL    Comment: Performed at [facility] [address] [place], [place] [address]  Glucose, capillary     Status: Abnormal   Collection Time: 12/13/19 11:07 AM  Result Value Ref Range   Glucose-Capillary 160 (H) 70 - 99 mg/dL    Comment: Glucose reference range applies only to samples taken after fasting for at least 8 hours.  Glucose, capillary     Status: Abnormal   Collection Time: 12/13/19  7:19 PM  Result Value Ref Range   Glucose-Capillary 137 (H) 70 - 99 mg/dL    Comment: Glucose reference range applies only to samples taken after fasting for at least 8 hours.  Lactic acid, plasma     Status: None   Collection Time: 12/13/19 10:19 PM  Result Value Ref Range   Lactic Acid, Venous 1.9 0.5 - 1.9 mmol/L    Comment:  Performed at [facility], [place] [address], [place], [place] [address]  Glucose, capillary     Status: Abnormal   Collection Time: 12/13/19 10:32 PM  Result Value Ref Range   Glucose-Capillary 190 (H) 70 - 99 mg/dL    Comment: Glucose reference range applies only to samples taken after fasting for at least 8 hours.  Procalcitonin     Status: None   Collection Time: 12/14/19   1:25 AM  Result Value Ref Range   Procalcitonin 0.79 ng/mL    Comment:        Interpretation: PCT > 0.5 ng/mL and <= 2 ng/mL: Systemic infection (sepsis) is possible, but other conditions are known to elevate PCT as well. (NOTE)       Sepsis PCT Algorithm           Lower Respiratory Tract                                      Infection PCT Algorithm    ----------------------------     ----------------------------         PCT < 0.25 ng/mL                PCT < 0.10 ng/mL          Strongly encourage             Strongly discourage   discontinuation of antibiotics    initiation of antibiotics    ----------------------------     -----------------------------       PCT 0.25 - 0.50 ng/mL            PCT 0.10 - 0.25 ng/mL               OR       >80% decrease in PCT            Discourage initiation of                                            antibiotics      Encourage discontinuation           of antibiotics    ----------------------------     -----------------------------         PCT >= 0.50 ng/mL              PCT 0.26 - 0.50 ng/mL                AND       <80% decrease in PCT             Encourage initiation of                                             antibiotics       Encourage continuation           of antibiotics    ----------------------------     -----------------------------        PCT >= 0.50 ng/mL                  PCT > 0.50 ng/mL  AND         increase in PCT                  Strongly encourage                                      initiation of antibiotics    Strongly encourage escalation           of antibiotics                                     -----------------------------                                           PCT <= 0.25 ng/mL                                                 OR                                        > 80% decrease in PCT                                      Discontinue / Do not initiate                                              antibiotics  Performed at [facility], [address], [place], [place] [address]   Basic metabolic panel     Status: Abnormal   Collection Time: 12/14/19  1:25 AM  Result Value Ref Range   Sodium 139 135 - 145 mmol/L   Potassium 5.0 3.5 - 5.1 mmol/L   Chloride 108 98 - 111 mmol/L   CO2 17 (L) 22 - 32 mmol/L   Glucose, Bld 149 (H) 70 - 99 mg/dL    Comment: Glucose reference range applies only to samples taken after fasting for at least 8 hours.   BUN 10 8 - 23 mg/dL   Creatinine, Ser 0.88 0.44 - 1.00 mg/dL   Calcium 7.6 (L) 8.9 - 10.3 mg/dL   GFR calc non Af Amer >60 >60 mL/min   GFR calc Af Amer >60 >60 mL/min   Anion gap 14 5 - 15    Comment: Performed at [facility] [address] [place], [place] [address]  Lactic acid, plasma     Status: Abnormal   Collection Time: 12/14/19  1:25 AM  Result Value Ref Range   Lactic Acid, Venous 4.6 (HH) 0.5 - 1.9 mmol/L    Comment: CRITICAL RESULT CALLED TO, READ BACK BY AND VERIFIED WITH: RN F PASEDA @0246  12/14/19 BY S GEZAHEGN Performed at [facility], [place] [address], [place], [place] [address]   Lactic acid, plasma  Status: Abnormal   Collection Time: 12/14/19  6:40 AM  Result Value Ref Range   Lactic Acid, Venous 2.3 (HH) 0.5 - 1.9 mmol/L    Comment: CRITICAL VALUE NOTED.  VALUE IS CONSISTENT WITH PREVIOUSLY REPORTED AND CALLED VALUE. Performed at [facility], [place] [address] [place], [place] [address]   CBC with Differential/Platelet     Status: Abnormal   Collection Time: 12/14/19  6:40 AM  Result Value Ref Range   WBC 9.3 4.0 - 10.5 K/uL   RBC 3.20 (L) 3.87 - 5.11 MIL/uL   Hemoglobin 10.0 (L) 12.0 - 15.0 g/dL   HCT 30.5 (L) 36 - 46 %   MCV 95.3 80.0 - 100.0 fL   MCH 31.3 26.0 - 34.0 pg   MCHC 32.8 30.0 - 36.0 g/dL   RDW 14.4 11.5 - 15.5 %   Platelets 182 150 - 400 K/uL   nRBC 0.3 (H) 0.0 - 0.2 %   Neutrophils Relative % 83 %   Neutro Abs 7.7 1.7 - 7.7 K/uL   Lymphocytes Relative 9 %    Lymphs Abs 0.9 0.7 - 4.0 K/uL   Monocytes Relative 6 %   Monocytes Absolute 0.6 0 - 1 K/uL   Eosinophils Relative 1 %   Eosinophils Absolute 0.1 0 - 0 K/uL   Basophils Relative 0 %   Basophils Absolute 0.0 0 - 0 K/uL   Immature Granulocytes 1 %   Abs Immature Granulocytes 0.09 (H) 0.00 - 0.07 K/uL    Comment: Performed at [facility] [address], [place], [place] [address]  Glucose, capillary     Status: None   Collection Time: 12/14/19  6:51 AM  Result Value Ref Range   Glucose-Capillary 98 70 - 99 mg/dL    Comment: Glucose reference range applies only to samples taken after fasting for at least 8 hours.  Glucose, capillary     Status: Abnormal   Collection Time: 12/14/19 11:36 AM  Result Value Ref Range   Glucose-Capillary 165 (H) 70 - 99 mg/dL    Comment: Glucose reference range applies only to samples taken after fasting for at least 8 hours.    ECG   Sinus tachycardia with anterolateral TWI's - personally reviewed  Telemetry   Sinus rhythm with ST depression - Personally Reviewed  Radiology    CT HEAD WO CONTRAST  Result Date: 12/14/2019 CLINICAL DATA:  Mental status change.  Unknown cause. EXAM: CT HEAD WITHOUT CONTRAST TECHNIQUE: Contiguous axial images were obtained from the base of the skull through the vertex without intravenous contrast. COMPARISON:  12/11/2019 FINDINGS: Brain: No evidence of acute infarction, hemorrhage, hydrocephalus, extra-axial collection or mass lesion/mass effect. Age related atrophy is noted. Vascular: No hyperdense vessel or unexpected calcification. Skull: Normal. Negative for fracture or focal lesion. Sinuses/Orbits: No acute finding. Other: None. IMPRESSION: 1. No acute intracranial abnormality. 2. Age related atrophy. Electronically Signed   By: Constance Holster M.D.   On: 12/14/2019 02:53   US Abdomen Complete  Result Date: 12/13/2019 CLINICAL DATA:  Vomiting EXAM: ABDOMEN ULTRASOUND COMPLETE COMPARISON:  None similar  FINDINGS: Gallbladder: History of cholecystectomy Common bile duct: Diameter: 4 mm Liver: Echogenic liver without focal lesion portal vein is patent on color Doppler imaging with normal direction of blood flow towards the liver. IVC: No abnormality visualized. Pancreas: Visualized portion unremarkable. Spleen: Rounded echogenic area measuring 1.2 Cm in the lower spleen. No splenic enlargement. Right Kidney: Length: 11 cm. Echogenicity within normal limits. No  mass or hydronephrosis visualized. Left Kidney: Length: 11.5 cm. Echogenicity within normal limits. No solid mass or hydronephrosis visualized. Simple interpolar cyst measuring up to 2.7 cm. 1 cm echogenic area measured in the interpolar left kidney, hilar fat versus incidental angiomyolipoma. Abdominal aorta: No aneurysm visualized. IMPRESSION: 1. No acute finding or explanation for symptoms. 2. Hepatic steatosis. 3. 12 mm echogenic lesion in the spleen, usually benign and incidental in isolation-often hemangioma. Electronically Signed   By: Monte Fantasia M.D.   On: 12/13/2019 06:55   DG CHEST PORT 1 VIEW  Result Date: 12/13/2019 CLINICAL DATA:  Acute respiratory failure with hypoxia. EXAM: PORTABLE CHEST 1 VIEW COMPARISON:  12/12/2019 FINDINGS: 0529 hours. Stable asymmetric elevation right hemidiaphragm. The cardio pericardial silhouette is enlarged. Interstitial markings are diffusely coarsened with chronic features. No pulmonary edema or focal airspace consolidation. No pleural effusion. The visualized bony structures of the thorax show no acute abnormality. Telemetry leads overlie the chest. IMPRESSION: Stable.  No acute cardiopulmonary findings. Electronically Signed   By: Misty Stanley M.D.   On: 12/13/2019 07:50   DG CHEST PORT 1 VIEW  Result Date: 12/12/2019 CLINICAL DATA:  Tachypnea. EXAM: PORTABLE CHEST 1 VIEW COMPARISON:  Single-view of the chest 12/12/2019. PA and lateral chest 10/16/2019. FINDINGS: Mild left basilar atelectasis. Lungs  otherwise clear. No pulmonary edema. Cardiomegaly. No pneumothorax or pleural effusion. No acute or focal bony abnormality. IMPRESSION: Cardiomegaly without acute disease. Electronically Signed   By: Inge Rise M.D.   On: 12/12/2019 14:43   ECHOCARDIOGRAM LIMITED  Result Date: 12/13/2019    ECHOCARDIOGRAM LIMITED REPORT   Patient Name:   Nicole [name] Date of Exam: 12/13/2019 Medical Rec #:  [id number]       Height:       62.0 in Accession #:    [id number]      Weight:       154.5 lb Date of Birth:  1945/07/18        BSA:          1.713 m Patient Age:    53 years        BP:           97/71 mmHg Patient Gender: F               HR:           93 bpm. Exam Location:  Inpatient Procedure: Limited Echo, Color Doppler, Cardiac Doppler and Intracardiac            Opacification Agent Indications:    Y19.50 Acute systolic (congestive) heart failure, NSTEMI  History:        Patient has prior history of Echocardiogram examinations, most                 recent 10/21/2019. CAD; Risk Factors:Diabetes, Dyslipidemia and                 COVID+ 04/21.  Sonographer:    Raquel Sarna Senior RDCS Referring Phys: [phone] Natacha Jepsen C Yeni Jiggetts IMPRESSIONS  1. EF similar to August diffuse hypokinesis worse in the septum , apex and inferior walls Basal lateral wall only area with preserved function . Left ventricular ejection fraction, by estimation, is 25 to 30%. The left ventricle has severely decreased function. The left ventricle demonstrates regional wall motion abnormalities (see scoring diagram/findings for description). The left ventricular internal cavity size was moderately dilated. Left ventricular diastolic parameters were normal.  2. Right ventricular systolic function was not well visualized. The right  ventricular size is not well visualized. There is mildly elevated pulmonary artery systolic pressure.  3. The mitral valve is normal in structure. Trivial mitral valve regurgitation. No evidence of mitral stenosis.  4. Tricuspid valve  regurgitation is moderate.  5. The aortic valve is normal in structure. Aortic valve regurgitation is not visualized. Mild to moderate aortic valve sclerosis/calcification is present, without any evidence of aortic stenosis.  6. The inferior vena cava is normal in size with greater than 50% respiratory variability, suggesting right atrial pressure of 3 mmHg. FINDINGS  Left Ventricle: EF similar to August diffuse hypokinesis worse in the septum , apex and inferior walls Basal lateral wall only area with preserved function. Left ventricular ejection fraction, by estimation, is 25 to 30%. The left ventricle has severely  decreased function. The left ventricle demonstrates regional wall motion abnormalities. Definity contrast agent was given IV to delineate the left ventricular endocardial borders. The left ventricular internal cavity size was moderately dilated. There is no left ventricular hypertrophy. Left ventricular diastolic parameters were normal. Right Ventricle: The right ventricular size is not well visualized. Right vetricular wall thickness was not assessed. Right ventricular systolic function was not well visualized. There is mildly elevated pulmonary artery systolic pressure. The tricuspid regurgitant velocity is 2.90 m/s, and with an assumed right atrial pressure of 3 mmHg, the estimated right ventricular systolic pressure is 28.4 mmHg. Left Atrium: Left atrial size was normal in size. Right Atrium: Right atrial size was normal in size. Pericardium: There is no evidence of pericardial effusion. Mitral Valve: The mitral valve is normal in structure. Trivial mitral valve regurgitation. No evidence of mitral valve stenosis. Tricuspid Valve: The tricuspid valve is normal in structure. Tricuspid valve regurgitation is moderate . No evidence of tricuspid stenosis. Aortic Valve: The aortic valve is normal in structure. Aortic valve regurgitation is not visualized. Mild to moderate aortic valve  sclerosis/calcification is present, without any evidence of aortic stenosis. Pulmonic Valve: The pulmonic valve was normal in structure. Pulmonic valve regurgitation is mild. No evidence of pulmonic stenosis. Aorta: The aortic root is normal in size and structure. Venous: The inferior vena cava is normal in size with greater than 50% respiratory variability, suggesting right atrial pressure of 3 mmHg. IAS/Shunts: No atrial level shunt detected by color flow Doppler.  LV Volumes (MOD) LV vol d, MOD A4C: 111.0 ml LV vol s, MOD A4C: 86.1 ml LV SV MOD A4C:     111.0 ml RIGHT VENTRICLE RV S prime:     6.96 cm/s TAPSE (M-mode): 1.2 cm TRICUSPID VALVE TR Peak grad:   33.6 mmHg TR Vmax:        290.00 cm/s Jenkins Rouge MD Electronically signed by Jenkins Rouge MD Signature Date/Time: 12/13/2019/11:48:45 AM    Final     Cardiac Studies   N/A  Assessment   Principal Problem:   Sepsis due to undetermined organism [facility]) Active Problems:   Anxiety   Depression   GERD with esophagitis   Mixed diabetic hyperlipidemia associated with type 2 diabetes mellitus (HCC)   Elevated troponin   CAD S/P percutaneous coronary angioplasty   Nystagmus   Chronic combined systolic and diastolic congestive heart failure (HCC)   Type 2 diabetes mellitus (HCC)   Hyperammonemia (HCC)   Chest pain in adult   Plan   1. NSTEMI - noted to have significant TWI's anterolaterally on EKG yesterday - this has somewhat improved and is likely d/t cardiomyopathy +/- electrolyte abnormalities.  Denies chest pain. She was  supposed to be started on heparin on the weekend, but apparently that was not started per pharmacy. Would not recommend it at this point.  On DAPT. Repeat limited echo appears unchanged with LVEF in the 25-30% range and regional wall motion abnomalities. Was adamant that she does not want to go back to the cath lab for any reason due to concerns about contrast dye reaction. No plans for intervention. 2. ICM/Chronic  combined systolic/diastolic CHF - LVEF 41-66% - appears euvolemic on exam, restarted HF meds including Entresto. Monitor I's/O's, daily weights and renal function. She is noted to be +2L yesterday. ? If this could be low output heart failure. Will need to be careful about continued IVF's -recommend d/c LR at this point. May monitoring of CVP or RHC if volume status and CO are in question. 3. Sepsis - Tmax 103.8 the other day, PCCM following - on broad-spectrum antibiotics. Noted to have AMS overnight - no clear source of infection. Tmax 101 overnight. ?if related to withdrawal - need for MRI or LP?  Time Spent Directly with Patient:  I have spent a total of 35 minutes with the patient reviewing hospital notes, telemetry, EKGs, labs and examining the patient as well as establishing an assessment and plan that was discussed personally with the patient.  > 50% of time was spent in direct patient care.  Length of Stay:  LOS: 2 days   Pixie Casino, MD, [facility], [place] Director of the Advanced Lipid Disorders &  Cardiovascular Risk Reduction Clinic Diplomate of the American Board of Clinical Lipidology Attending Cardiologist  Direct Dial: [phone]   Fax: [phone]  Website:  [email or web address] 12/14/2019, 12:16 PM

## 2019-12-14 NOTE — Progress Notes (Signed)
CRITICAL VALUE ALERT  Critical Value: lactic acid 4.6  Date & Time Notied:  12/14/2019 @0250   Provider Notified:Chotiner, MD   Orders Received/Actions taken:

## 2019-12-15 DIAGNOSIS — R778 Other specified abnormalities of plasma proteins: Secondary | ICD-10-CM | POA: Diagnosis not present

## 2019-12-15 DIAGNOSIS — I2510 Atherosclerotic heart disease of native coronary artery without angina pectoris: Secondary | ICD-10-CM | POA: Diagnosis not present

## 2019-12-15 DIAGNOSIS — I214 Non-ST elevation (NSTEMI) myocardial infarction: Secondary | ICD-10-CM

## 2019-12-15 DIAGNOSIS — F13239 Sedative, hypnotic or anxiolytic dependence with withdrawal, unspecified: Secondary | ICD-10-CM | POA: Diagnosis not present

## 2019-12-15 DIAGNOSIS — A419 Sepsis, unspecified organism: Secondary | ICD-10-CM | POA: Diagnosis not present

## 2019-12-15 DIAGNOSIS — R6520 Severe sepsis without septic shock: Secondary | ICD-10-CM

## 2019-12-15 DIAGNOSIS — J9601 Acute respiratory failure with hypoxia: Secondary | ICD-10-CM | POA: Diagnosis not present

## 2019-12-15 DIAGNOSIS — I5042 Chronic combined systolic (congestive) and diastolic (congestive) heart failure: Secondary | ICD-10-CM | POA: Diagnosis not present

## 2019-12-15 LAB — RESPIRATORY PANEL BY PCR

## 2019-12-15 LAB — BASIC METABOLIC PANEL
Anion gap: 7 (ref 5–15)
BUN: 7 mg/dL — ABNORMAL LOW (ref 8–23)
CO2: 24 mmol/L (ref 22–32)
Calcium: 9.3 mg/dL (ref 8.9–10.3)
Chloride: 108 mmol/L (ref 98–111)
Creatinine, Ser: 0.8 mg/dL (ref 0.44–1.00)
GFR calc non Af Amer: >60 mL/min (ref >60)
Glucose, Bld: 129 mg/dL — ABNORMAL HIGH (ref 70–99)
Potassium: 5 mmol/L (ref 3.5–5.1)
Sodium: 139 mmol/L (ref 135–145)

## 2019-12-15 LAB — GLUCOSE, CAPILLARY
Glucose-Capillary: 73 mg/dL (ref 70–99)
Glucose-Capillary: 158 mg/dL — ABNORMAL HIGH (ref 70–99)
Glucose-Capillary: 162 mg/dL — ABNORMAL HIGH (ref 70–99)

## 2019-12-15 MED ORDER — METOPROLOL SUCCINATE ER 50 MG PO TB24
50.0000 mg | ORAL_TABLET | Freq: Every day | ORAL | Status: DC
  Administered 2019-12-15 – 2019-12-18 (×4): 50 mg via ORAL
  Filled 2019-12-15 (×4): qty 1

## 2019-12-15 MED ORDER — HALOPERIDOL LACTATE 5 MG/ML IJ SOLN: 1.0000 mg | Freq: Four times a day (QID) | INTRAMUSCULAR | Status: DC | PRN

## 2019-12-15 NOTE — Progress Notes (Deleted)
Cardiology Clinic Note   Patient Name: Nicole [name] Date of Encounter: 12/15/2019  Primary Care Provider:  [name], MD Primary Cardiologist:  Peter Martinique, MD  Patient Profile    ***  Past Medical History    Past Medical History:  Diagnosis Date  . Anemia   . Anxiety   . Back pain   . CAD (coronary artery disease) 10/22/2019   DES to [facility] August 2021  . Colon polyps   . COVID-28 June 2019  . Endometriosis   . Fibromyalgia   . GERD with esophagitis    Follows with Dr. Fuller Plan with GI, EGD in the past  . IBS (irritable bowel syndrome)   . Major depressive disorder   . Migraine   . Mild intermittent asthma   . Mixed diabetic hyperlipidemia associated with type 2 diabetes mellitus ([place])   . Obesity   . Type 2 diabetes mellitus ([place])   . Urinary incontinence    Past Surgical History:  Procedure Laterality Date  . ABDOMINAL HYSTERECTOMY     TAH BSO  . APPENDECTOMY  1999  . CATARACT EXTRACTION     both  . CHOLECYSTECTOMY    . CORONARY STENT INTERVENTION N/A 10/21/2019   Procedure: CORONARY STENT INTERVENTION;  Surgeon: Troy Sine, MD;  Location: [place] [facility];  Service: Cardiovascular;  Laterality: N/A;  . DENTAL SURGERY    . HERNIA REPAIR  2000  . LAPAROSCOPIC ENDOMETRIOSIS FULGURATION    . RIGHT/LEFT HEART CATH AND CORONARY ANGIOGRAPHY N/A 10/21/2019   Procedure: RIGHT/LEFT HEART CATH AND CORONARY ANGIOGRAPHY;  Surgeon: Troy Sine, MD;  Location: [place] [facility];  Service: Cardiovascular;  Laterality: N/A;    Allergies  Allergies  Allergen Reactions  . Fish-Derived Products Anaphylaxis  . Shellfish Allergy Shortness Of Breath    In dye for scan.  . Aspirin Nausea Only and Other (See Comments)    Pt stated it only gives her problems when she takes the high dose (325 mg)  . Ivp Dye [Iodinated Diagnostic Agents]     Hx of ? anaphlaxis  . Latex Other (See Comments)    Pt says she gets a "stinky infection"    History of Present  Illness    ***  Home Medications    Prior to Admission medications   Medication Sig Start Date End Date Taking? Authorizing Provider  Ascorbic Acid (VITAMIN C PO) Take 1 tablet by mouth daily.    [provider]  aspirin 81 MG tablet Take 81 mg by mouth daily.    [provider]  atorvastatin (LIPITOR) 80 MG tablet Take 1 tablet (80 mg total) by mouth at bedtime. 11/19/19 12/19/19  Martinique, Peter M, MD  bismuth subsalicylate (PEPTO BISMOL) 262 MG/15ML suspension Take 30 mLs by mouth every 6 (six) hours as needed for indigestion.    [provider]  DULoxetine (CYMBALTA) 60 MG capsule Take 60 mg by mouth daily. 08/10/19   [provider]  ergocalciferol (VITAMIN D2) 50000 UNITS capsule Take 50,000 Units by mouth once a week.      [provider]  fluticasone (FLONASE) 50 MCG/ACT nasal spray Place 1 spray into both nostrils daily as needed for allergies or rhinitis.  05/28/19   [provider]  Fluticasone-Salmeterol (ADVAIR DISKUS) 250-50 MCG/DOSE AEPB Inhale 1 puff into the lungs 2 (two) times daily.    [provider]  loratadine (CLARITIN) 10 MG tablet Take 1 tablet (10 mg total)  by mouth daily. 06/28/19   Ghimire, Henreitta Leber, MD  LORazepam (ATIVAN) 1 MG tablet Take 1 mg by mouth 2 (two) times daily.     [provider]  metoprolol succinate (TOPROL-XL) 25 MG 24 hr tablet Take 1 tablet (25 mg total) by mouth daily. 11/19/19 12/19/19  Martinique, Peter M, MD  NONFORMULARY OR COMPOUNDED ITEM Estradiol 0.02% vaginal cream prefilled applicators apply one twice weekly 05/23/15   Fontaine, Belinda Block, MD  ondansetron (ZOFRAN) 4 MG tablet Take 4 mg by mouth every 8 (eight) hours as needed for nausea or vomiting.  11/22/19   [provider]  Jonetta Speak LANCETS 75Z MISC  06/03/16   [provider]  [facility] VERIO test strip  06/03/16   [provider]  oxyCODONE-acetaminophen (PERCOCET) 10-325 MG per tablet Take 1  tablet by mouth 2 (two) times daily as needed for pain.     [provider]  pantoprazole (PROTONIX) 40 MG tablet Take 1 tablet (40 mg total) by mouth 2 (two) times daily. 08/14/11   Ladene Artist, MD  potassium chloride SA (KLOR-CON) 20 MEQ tablet Take 2 tablets (40 mEq total) by mouth daily. 11/19/19 12/19/19  Martinique, Peter M, MD  PROAIR HFA 108 [phone] Base) MCG/ACT inhaler Inhale 2 puffs into the lungs every 4 (four) hours as needed for wheezing or shortness of breath. Uses as rescue inhaler Reported on 06/20/2015 Patient taking differently: Inhale 2 puffs into the lungs every 4 (four) hours as needed for wheezing or shortness of breath. Uses as rescue inhaler Reported on 10/16/2019 06/28/19   Jonetta Osgood, MD  sacubitril-valsartan (ENTRESTO) 24-26 MG Take 1 tablet by mouth 2 (two) times daily. 11/19/19 12/19/19  Martinique, Peter M, MD  ticagrelor (BRILINTA) 90 MG TABS tablet Take 1 tablet (90 mg total) by mouth 2 (two) times daily. 11/19/19 12/19/19  Martinique, Peter M, MD    Family History    Family History  Problem Relation Age of Onset  . Hypertension Mother   . Heart failure Mother   . Diabetes Mother   . Hypertension Father   . Heart failure Father   . Diabetes Father   . Heart attack Father   . Stroke Father   . Cancer Brother        lung  . Thyroid disease Brother   . Cancer Brother        throat  . Cancer Paternal Aunt        bone  . Cancer Paternal Uncle        lung  . Hypertension Maternal Grandmother   . Hypertension Maternal Grandfather   . Hypertension Paternal Grandmother   . Hypertension Paternal Grandfather   . Diabetes Daughter   . Colon cancer Neg Hx   . Stomach cancer Neg Hx    She indicated that her mother is deceased. She indicated that her father is deceased. She indicated that two of her five brothers are alive. She indicated that the status of her maternal grandmother is unknown. She indicated that the status of her maternal grandfather is unknown.  She indicated that the status of her paternal grandmother is unknown. She indicated that the status of her paternal grandfather is unknown. She indicated that only one of her two daughters is alive. She indicated that the status of her paternal aunt is unknown. She indicated that the status of her paternal uncle is unknown. She indicated that the status of her neg hx is unknown.  Social History  Social History   Socioeconomic History  . Marital status: Widowed    Spouse name: Not on file  . Number of children: 1  . Years of education: Not on file  . Highest education level: Not on file  Occupational History  . Occupation: disability  Tobacco Use  . Smoking status: Never Smoker  . Smokeless tobacco: Never Used  Substance and Sexual Activity  . Alcohol use: Yes    Alcohol/week: 0.0 standard drinks    Comment: occasional  . Drug use: No  . Sexual activity: Not Currently    Comment: 1st intercourse 74 yo-Fewer than 5 partners  Other Topics Concern  . Not on file  Social History Narrative  . Not on file   Social Determinants of Health   Financial Resource Strain:   . Difficulty of Paying Living Expenses: Not on file  Food Insecurity:   . Worried About Charity fundraiser in the Last Year: Not on file  . Ran Out of Food in the Last Year: Not on file  Transportation Needs:   . Lack of Transportation (Medical): Not on file  . Lack of Transportation (Non-Medical): Not on file  Physical Activity:   . Days of Exercise per Week: Not on file  . Minutes of Exercise per Session: Not on file  Stress:   . Feeling of Stress : Not on file  Social Connections:   . Frequency of Communication with Friends and Family: Not on file  . Frequency of Social Gatherings with Friends and Family: Not on file  . Attends Religious Services: Not on file  . Active Member of Clubs or Organizations: Not on file  . Attends Archivist Meetings: Not on file  . Marital Status: Not on file    Intimate Partner Violence:   . Fear of Current or Ex-Partner: Not on file  . Emotionally Abused: Not on file  . Physically Abused: Not on file  . Sexually Abused: Not on file     Review of Systems    General:  No chills, fever, night sweats or weight changes.  Cardiovascular:  No chest pain, dyspnea on exertion, edema, orthopnea, palpitations, paroxysmal nocturnal dyspnea. Dermatological: No rash, lesions/masses Respiratory: No cough, dyspnea Urologic: No hematuria, dysuria Abdominal:   No nausea, vomiting, diarrhea, bright red blood per rectum, melena, or hematemesis Neurologic:  No visual changes, wkns, changes in mental status. All other systems reviewed and are otherwise negative except as noted above.  Physical Exam    VS:  There were no vitals taken for this visit. , BMI There is no height or weight on file to calculate BMI. GEN: Well nourished, well developed, in no acute distress. HEENT: normal. Neck: Supple, no JVD, carotid bruits, or masses. Cardiac: RRR, no murmurs, rubs, or gallops. No clubbing, cyanosis, edema.  Radials/DP/PT 2+ and equal bilaterally.  Respiratory:  Respirations regular and unlabored, clear to auscultation bilaterally. GI: Soft, nontender, nondistended, BS + x 4. MS: no deformity or atrophy. Skin: warm and dry, no rash. Neuro:  Strength and sensation are intact. Psych: Normal affect.  Accessory Clinical Findings    Recent Labs: 10/16/2019: B Natriuretic Peptide 504.9 12/11/2019: TSH 2.114 12/13/2019: ALT 30 12/14/2019: Hemoglobin 10.0; Magnesium 2.0; Platelets 182 12/15/2019: BUN 7; Creatinine, Ser 0.80; Potassium 5.0; Sodium 139   Recent Lipid Panel    Component Value Date/Time   CHOL 182 10/22/2019 0705   TRIG 130 10/22/2019 0705   HDL 46 10/22/2019 0705   CHOLHDL 4.0 10/22/2019  0705   VLDL 26 10/22/2019 0705   LDLCALC 110 (H) 10/22/2019 0705    ECG personally reviewed by me today- *** - No acute changes  Assessment & Plan   1.   ***   Jossie Ng. Taylan Mayhan NP-C    12/15/2019, 9:37 PM [place] [address] Office [phone] Fax [phone]  Notice: This dictation was prepared with Dragon dictation along with smaller phrase technology. Any transcriptional errors that result from this process are unintentional and may not be corrected upon review.

## 2019-12-15 NOTE — Progress Notes (Signed)
Nicole [name] for Infectious Disease  Date of Admission:  12/11/2019      Reason for Consult: sepsis, ams                                 Referring Provider: Karie Kirks Primary Care Provider: [name]    Lines:  Peripheral IV's  Abx: 10/3-c piptazo  10/3-10/5 vancomycin 10/3 ceftriaxone                                                             Assessment: 74 yo female intermittent asthma, combined chf, recent 10/2019 ptca, anxiety/fibromyalgia, gerd, chronic lorazepam admitted for sepsis in setting 2 days n/v/diarrhea. Of note had prior covid 06/2019 not intubated; received monoclonal ab but admitted and never intubated; had moderna vaccine series 1 a week prior to admission  Unfortunately blood cx obtained after abx given.   A few theories to be entertained, suspect in setting poor cardiac function and polypharmacy had n/v. Again diarrhea hard to explain. On admission developed AMS and had aspiration pneumonitis with rising procalcitonin. Rat exposure but no strong clinical evidence rat bite fever  Respiratory panel pcr negative  Discussed with family. Patient clinically doing well. Agreeable to stopping abx and observe  Plan: 1. Stop abx 2. ID f/u in clinic in 2-4 weeks; appointment being arranged   Principal Problem:   Severe sepsis ([place]) Active Problems:   Anxiety   Depression   GERD with esophagitis   Mixed diabetic hyperlipidemia associated with type 2 diabetes mellitus (HCC)   Elevated troponin   CAD S/P percutaneous coronary angioplasty   Nystagmus   Chronic combined systolic and diastolic congestive heart failure (HCC)   Type 2 diabetes mellitus (HCC)   Hyperammonemia (HCC)   Chest pain in adult   Acute respiratory failure with hypoxia (HCC)   Benzodiazepine withdrawal with complication (HCC)   Non-ST elevation (NSTEMI) myocardial infarction (HCC)   Scheduled Meds:  aspirin  81 mg Oral Daily   atorvastatin  80 mg Oral  QHS   DULoxetine  60 mg Oral Daily   enoxaparin (LOVENOX) injection  40 mg Subcutaneous [id number]   folic acid  1 mg Oral Daily   LORazepam  1 mg Oral BID   metoprolol succinate  50 mg Oral Daily   mometasone-formoterol  2 puff Inhalation BID   multivitamin with minerals  1 tablet Oral Daily   pantoprazole (PROTONIX) IV  40 mg Intravenous Q24H   sacubitril-valsartan  1 tablet Oral BID   thiamine  100 mg Oral Daily   Or   thiamine  100 mg Intravenous Daily   ticagrelor  90 mg Oral BID   Vitamin D (Ergocalciferol)  50,000 Units Oral Q Sun   Continuous Infusions: PRN Meds:.acetaminophen **OR** acetaminophen, bismuth subsalicylate, fluticasone, haloperidol lactate, levalbuterol, morphine injection, ondansetron **OR** ondansetron (ZOFRAN) IV, oxyCODONE   SUBJECTIVE: No fever bp stable. Appears 90s-100s her baseline No n/v/diarrhea Better oral tolerance   Review of Systems: Negative 11 point ros unless mentioned   Allergies  Allergen Reactions   Fish-Derived Products Anaphylaxis   Shellfish Allergy Shortness Of Breath    In dye for scan.   Aspirin Nausea Only and Other (See  Comments)    Pt stated it only gives her problems when she takes the high dose (325 mg)   Ivp Dye [Iodinated Diagnostic Agents]     Hx of ? anaphlaxis   Latex Other (See Comments)    Pt says she gets a "stinky infection"    OBJECTIVE: Vitals:   12/15/19 0400 12/15/19 0831 12/15/19 0844 12/15/19 1128  BP: (!) 146/59  (!) 146/83 135/69  Pulse: 85 97 97 96  Resp: 20 (!) 21 (!) 21 19  Temp:   98.4 F (36.9 C) 98.9 F (37.2 C)  TempSrc:   Oral Oral  SpO2: 95% 100% 100% 95%  Weight:      Height:       Body mass index is 28.27 kg/m.  Physical Exam Vitals reviewed.  Constitutional:      Appearance: Normal appearance.  HENT:     Head: Normocephalic.     Mouth/Throat:     Mouth: Mucous membranes are moist.  Eyes:     Extraocular Movements: Extraocular movements intact.      Pupils: Pupils are equal, round, and reactive to light.  Cardiovascular:     Rate and Rhythm: Normal rate and regular rhythm.     Heart sounds: Normal heart sounds.  Pulmonary:     Effort: Pulmonary effort is normal.     Breath sounds: Normal breath sounds.  Abdominal:     Palpations: Abdomen is soft.     Tenderness: There is no abdominal tenderness.  Musculoskeletal:     Cervical back: Neck supple.  Skin:    General: Skin is warm.  Neurological:     General: No focal deficit present.     Mental Status: She is alert.  Psychiatric:        Mood and Affect: Mood normal.        Behavior: Behavior normal.     Lab Results Lab Results  Component Value Date   WBC 9.3 12/14/2019   HGB 10.0 (L) 12/14/2019   HCT 30.5 (L) 12/14/2019   MCV 95.3 12/14/2019   PLT 182 12/14/2019    Lab Results  Component Value Date   CREATININE 0.80 12/15/2019   BUN 7 (L) 12/15/2019   NA 139 12/15/2019   K 5.0 12/15/2019   CL 108 12/15/2019   CO2 24 12/15/2019    Lab Results  Component Value Date   ALT 30 12/13/2019   AST 64 (H) 12/13/2019   ALKPHOS 48 12/13/2019   BILITOT 1.1 12/13/2019     Microbiology: Recent Results (from the past 240 hour(s))  Respiratory Panel by RT PCR (Flu A&B, Covid) - Nasopharyngeal Swab     Status: None   Collection Time: 12/11/19 10:54 PM   Specimen: Nasopharyngeal Swab  Result Value Ref Range Status   SARS Coronavirus 2 by RT PCR NEGATIVE NEGATIVE Final    Comment: (NOTE) SARS-CoV-2 target nucleic acids are NOT DETECTED.  The SARS-CoV-2 RNA is generally detectable in upper respiratoy specimens during the acute phase of infection. The lowest concentration of SARS-CoV-2 viral copies this assay can detect is 131 copies/mL. A negative result does not preclude SARS-Cov-2 infection and should not be used as the sole basis for treatment or other patient management decisions. A negative result may occur with  improper specimen collection/handling, submission of  specimen other than nasopharyngeal swab, presence of viral mutation(s) within the areas targeted by this assay, and inadequate number of viral copies (<131 copies/mL). A negative result must be combined with clinical observations,  patient history, and epidemiological information. The expected result is Negative.  Fact Sheet for Patients:  PinkCheek.be  Fact Sheet for Healthcare Providers:  GravelBags.it  This test is no t yet approved or cleared by the [place] FDA and  has been authorized for detection and/or diagnosis of SARS-CoV-2 by FDA under an Emergency Use Authorization (EUA). This EUA will remain  in effect (meaning this test can be used) for the duration of the COVID-19 declaration under Section 564(b)(1) of the Act, 21 U.S.C. section 360bbb-3(b)(1), unless the authorization is terminated or revoked sooner.     Influenza A by PCR NEGATIVE NEGATIVE Final   Influenza B by PCR NEGATIVE NEGATIVE Final    Comment: (NOTE) The Xpert Xpress SARS-CoV-2/FLU/RSV assay is intended as an aid in  the diagnosis of influenza from Nasopharyngeal swab specimens and  should not be used as a sole basis for treatment. Nasal washings and  aspirates are unacceptable for Xpert Xpress SARS-CoV-2/FLU/RSV  testing.  Fact Sheet for Patients: PinkCheek.be  Fact Sheet for Healthcare Providers: GravelBags.it  This test is not yet approved or cleared by the [place] FDA and  has been authorized for detection and/or diagnosis of SARS-CoV-2 by  FDA under an Emergency Use Authorization (EUA). This EUA will remain  in effect (meaning this test can be used) for the duration of the  Covid-19 declaration under Section 564(b)(1) of the Act, 21  U.S.C. section 360bbb-3(b)(1), unless the authorization is  terminated or revoked. Performed at [facility], [place] [address]  [place], [place] [address]   Culture, blood (routine x 2)     Status: None (Preliminary result)   Collection Time: 12/12/19  3:06 PM   Specimen: BLOOD  Result Value Ref Range Status   Specimen Description BLOOD LEFT ANTECUBITAL  Final   Special Requests   Final    BOTTLES DRAWN AEROBIC AND ANAEROBIC Blood Culture results may not be optimal due to an inadequate volume of blood received in culture bottles   Culture   Final    NO GROWTH 3 DAYS Performed at [facility], [place] [address] [place], [place] [address]    Report Status PENDING  Incomplete  Culture, blood (routine x 2)     Status: None (Preliminary result)   Collection Time: 12/12/19  3:12 PM   Specimen: BLOOD RIGHT HAND  Result Value Ref Range Status   Specimen Description BLOOD RIGHT HAND  Final   Special Requests   Final    BOTTLES DRAWN AEROBIC ONLY Blood Culture results may not be optimal due to an inadequate volume of blood received in culture bottles   Culture   Final    NO GROWTH 3 DAYS Performed at [facility], [place] [address], [place], [place] [address]    Report Status PENDING  Incomplete  MRSA PCR Screening     Status: None   Collection Time: 12/13/19  6:20 AM   Specimen: Nasopharyngeal  Result Value Ref Range Status   MRSA by PCR NEGATIVE NEGATIVE Final    Comment:        The GeneXpert MRSA Assay (FDA approved for NASAL specimens only), is one component of a comprehensive MRSA colonization surveillance program. It is not intended to diagnose MRSA infection nor to guide or monitor treatment for MRSA infections. Performed at [facility], [place] [address] [place], [place] [address]   Respiratory Panel by PCR     Status: None   Collection Time: 12/15/19  9:21 AM   Specimen: Nasopharyngeal Swab; Respiratory  Result Value Ref Range Status   Adenovirus NOT DETECTED NOT DETECTED Final   Coronavirus 229E NOT DETECTED NOT DETECTED Final    Comment: (NOTE) The Coronavirus on the Respiratory  Panel, DOES NOT test for the novel  Coronavirus (2019 nCoV)    Coronavirus HKU1 NOT DETECTED NOT DETECTED Final   Coronavirus NL63 NOT DETECTED NOT DETECTED Final   Coronavirus OC43 NOT DETECTED NOT DETECTED Final   Metapneumovirus NOT DETECTED NOT DETECTED Final   Rhinovirus / Enterovirus NOT DETECTED NOT DETECTED Final   Influenza A NOT DETECTED NOT DETECTED Final   Influenza B NOT DETECTED NOT DETECTED Final   Parainfluenza Virus 1 NOT DETECTED NOT DETECTED Final   Parainfluenza Virus 2 NOT DETECTED NOT DETECTED Final   Parainfluenza Virus 3 NOT DETECTED NOT DETECTED Final   Parainfluenza Virus 4 NOT DETECTED NOT DETECTED Final   Respiratory Syncytial Virus NOT DETECTED NOT DETECTED Final   Bordetella pertussis NOT DETECTED NOT DETECTED Final   Chlamydophila pneumoniae NOT DETECTED NOT DETECTED Final   Mycoplasma pneumoniae NOT DETECTED NOT DETECTED Final    Comment: Performed at [facility], [place]. [address], [place], [place] [address]       Walker Sitar T Carlo Guevarra, [place] for [place] [phone] pager    12/15/2019, 1:08 PM

## 2019-12-15 NOTE — NC FL2 (Signed)
[place] LEVEL OF CARE SCREENING TOOL     IDENTIFICATION  Patient Name: Nicole [name] Birthdate: 26-Jun-1945 Sex: female Admission Date (Current Location): 12/11/2019  [facility] and [place] Number:  Herbalist and Address:  [facility] [place], [place] [address] [place], [place] [address]      Provider Number: [id number]  Attending Physician Name and Address:  Charlynne Cousins, MD  Relative Name and Phone Number:  [name], daughter, [phone]    Current Level of Care: Hospital Recommended Level of Care: [place] Prior Approval Number:    Date Approved/Denied:   PASRR Number:    Discharge Plan:      Current Diagnoses: Patient Active Problem List   Diagnosis Date Noted  . Non-ST elevation (NSTEMI) myocardial infarction ([place])   . Acute respiratory failure with hypoxia ([place])   . Benzodiazepine withdrawal with complication ([place])   . Severe sepsis ([place]) 12/12/2019  . Hyperammonemia ([place]) 12/12/2019  . Chest pain in adult 12/12/2019  . Nystagmus 12/11/2019  . Leukocytosis 12/11/2019  . Chronic combined systolic and diastolic congestive heart failure ([place]) 12/11/2019  . Type 2 diabetes mellitus ([place]) 12/11/2019  . Acute combined systolic and diastolic congestive heart failure ([place]) 10/22/2019  . CAD S/P percutaneous coronary angioplasty 10/22/2019  . Aphasia 10/21/2019  . Elevated troponin   . Cardiomyopathy ([place])   . DOE (dyspnea on exertion) 10/16/2019  . Mild intermittent asthma with (acute) exacerbation 06/24/2019  . COVID-19 virus infection 06/24/2019  . Acute metabolic encephalopathy 60/63/0160  . Hypokalemia, inadequate intake 06/24/2019  . Hypomagnesemia 06/24/2019  . GERD with esophagitis   . Major depressive disorder   . Mixed diabetic hyperlipidemia associated with type 2 diabetes mellitus ([place])   . Chronic daily headache 12/07/2014  . Memory loss 08/31/2014  . Worsening headaches 08/31/2014  .  Atypical chest pain 11/05/2013  . Anemia   . Anxiety   . Depression   . Diabetes mellitus type 2 in obese ([place])   . Urinary incontinence   . Endometriosis   . Asthma   . Fibromyalgia     Orientation RESPIRATION BLADDER Height & Weight     Self, Place  Normal Incontinent, External catheter Weight: 154 lb 8.7 oz (70.1 kg) Height:  5\' 2"  (157.5 cm)  BEHAVIORAL SYMPTOMS/MOOD NEUROLOGICAL BOWEL NUTRITION STATUS      Incontinent Diet (Please see DC Summary)  AMBULATORY STATUS COMMUNICATION OF NEEDS Skin   Extensive Assist Verbally Normal                       Personal Care Assistance Level of Assistance  Bathing, Feeding, Dressing Bathing Assistance: Maximum assistance Feeding assistance: Limited assistance Dressing Assistance: Limited assistance     Functional Limitations Info             SPECIAL CARE FACTORS FREQUENCY  PT (By licensed PT), OT (By licensed OT)     PT Frequency: 5x/week OT Frequency: 5x/week            Contractures Contractures Info: Not present    Additional Factors Info  Code Status, Allergies, Psychotropic Code Status Info: Full Allergies Info: Fish-derived Products, Shellfish Allergy, Aspirin, Ivp Dye (Iodinated Diagnostic Agents), Latex Psychotropic Info: Cymbalta, Ativan         Current Medications (12/15/2019):  This is the current hospital active medication list Current Facility-Administered Medications  Medication Dose Route Frequency Provider Last Rate Last Admin  . acetaminophen (TYLENOL) tablet 650  mg  650 mg Oral Q6H PRN Swayze, Ava, DO   650 mg at 12/14/19 2059   Or  . acetaminophen (TYLENOL) suppository 650 mg  650 mg Rectal Q6H PRN Swayze, Ava, DO   650 mg at 12/12/19 1415  . aspirin chewable tablet 81 mg  81 mg Oral Daily Swayze, Ava, DO   81 mg at 12/15/19 0836  . atorvastatin (LIPITOR) tablet 80 mg  80 mg Oral QHS Swayze, Ava, DO   80 mg at 12/14/19 2058  . bismuth subsalicylate (PEPTO BISMOL) 262 MG/15ML suspension 30  mL  30 mL Oral Q6H PRN Swayze, Ava, DO      . DULoxetine (CYMBALTA) DR capsule 60 mg  60 mg Oral Daily Swayze, Ava, DO   60 mg at 12/15/19 0836  . enoxaparin (LOVENOX) injection 40 mg  40 mg Subcutaneous Q24H Swayze, Ava, DO   40 mg at 12/15/19 1301  . fluticasone (FLONASE) 50 MCG/ACT nasal spray 1 spray  1 spray Each Nare Daily PRN Swayze, Ava, DO      . folic acid (FOLVITE) tablet 1 mg  1 mg Oral Daily Swayze, Ava, DO   1 mg at 12/15/19 0836  . haloperidol lactate (HALDOL) injection 1 mg  1 mg Intravenous Q6H PRN Charlynne Cousins, MD      . levalbuterol [facility]) nebulizer solution 1.25 mg  1.25 mg Nebulization Q6H PRN Swayze, Ava, DO      . LORazepam (ATIVAN) tablet 1 mg  1 mg Oral BID Swayze, Ava, DO   1 mg at 12/15/19 0836  . metoprolol succinate (TOPROL-XL) 24 hr tablet 50 mg  50 mg Oral Daily Roby Lofts M., PA-C   50 mg at 12/15/19 1131  . mometasone-formoterol (DULERA) 200-5 MCG/ACT inhaler 2 puff  2 puff Inhalation BID Swayze, Ava, DO   2 puff at 12/15/19 0830  . morphine 2 MG/ML injection 2 mg  2 mg Intravenous Q1H PRN Swayze, Ava, DO      . multivitamin with minerals tablet 1 tablet  1 tablet Oral Daily Swayze, Ava, DO   1 tablet at 12/15/19 0836  . ondansetron (ZOFRAN) tablet 4 mg  4 mg Oral Q6H PRN Swayze, Ava, DO   4 mg at 12/12/19 0217   Or  . ondansetron (ZOFRAN) injection 4 mg  4 mg Intravenous Q6H PRN Swayze, Ava, DO   4 mg at 12/12/19 0817  . oxyCODONE (Oxy IR/ROXICODONE) immediate release tablet 5 mg  5 mg Oral Q4H PRN Swayze, Ava, DO   5 mg at 12/13/19 0639  . pantoprazole (PROTONIX) injection 40 mg  40 mg Intravenous Q24H Chotiner, Yevonne Aline, MD   40 mg at 12/14/19 2058  . sacubitril-valsartan (ENTRESTO) 24-26 mg per tablet  1 tablet Oral BID Swayze, Ava, DO   1 tablet at 12/15/19 0836  . thiamine tablet 100 mg  100 mg Oral Daily Swayze, Ava, DO   100 mg at 12/15/19 0836   Or  . thiamine (B-1) injection 100 mg  100 mg Intravenous Daily Swayze, Ava, DO      .  ticagrelor (BRILINTA) tablet 90 mg  90 mg Oral BID Swayze, Ava, DO   90 mg at 12/15/19 0836  . Vitamin D (Ergocalciferol) (DRISDOL) capsule 50,000 Units  50,000 Units Oral Q Sun Swayze, Ava, DO   50,000 Units at 12/12/19 0940     Discharge Medications: Please see discharge summary for a list of discharge medications.  Relevant Imaging Results:  Relevant Lab Results:  Additional Information SSN: Melvin B[place], [place]

## 2019-12-15 NOTE — Progress Notes (Signed)
Heart Failure Stewardship Pharmacist Progress Note   PCP: [name], MD PCP-Cardiologist: Peter Martinique, MD    HPI:  74 yo F with PMH of CAD, CHF, type 2 diabetes, and HLD. She presented to the ED on 12/11/19 with dizziness and emesis. She was admitted for severe sepsis and is receiving IV antibiotics. She recently was hospitalized for NSTEMI in 10/2019. An ECHO was that showed LVEF 20-25% at that time. A repeat ECHO was done on 12/13/19 and LVEF slightly improved to 25-30%.  Current HF Medications: Metoprolol 10 mg IV q8h Entresto 24/26 mg BID  Prior to admission HF Medications: Metoprolol XL 25 mg daily Entresto 24/26 mg BID  Pertinent Lab Values: . Serum creatinine 0.8, BUN 7, Potassium 5, Sodium 139, Magnesium 2   Vital Signs: . Weight: 154 lbs (admission weight: 154 lbs) . Blood pressure: 130-150/80s  . Heart rate: 90s   Medication Assistance / Insurance Benefits Check: Does the patient have prescription insurance?  Yes Type of insurance plan: [place] from [place]:  Prior to admission outpatient pharmacy: [facility] Is the patient willing to use [facility] pharmacy at discharge? Yes Is the patient willing to transition their outpatient pharmacy to utilize a [facility] outpatient pharmacy?   No - insurance prefers mail order   Assessment: 1. Chronic systolic CHF (EF 54-62%), due to ICM. NYHA class II symptoms. - Metoprolol 10 mg IV q8h - consider transitioning to metoprolol XL - Continue Entresto 24/26 mg BID - Consider starting spironolactone, however, K is up to 5 today but she is also receiving KCl 40 mEq BID. Consider stopping K replacement to reassess K and may be able to start spironolactone tomorrow.   Plan: 1) Medication changes recommended at this time: - Stop KCl 40 mEq BID - May be able to start spironolactone 25 mg daily pending K trends   2) Patient assistance application(s): - None pending  3)  Education  - To be  completed prior to discharge  Kerby Nora, PharmD, BCPS Heart Failure Stewardship Pharmacist Phone [phone]

## 2019-12-15 NOTE — Progress Notes (Signed)
Mobility Specialist - Progress Note   12/15/19 1545  Mobility  Activity Stood at bedside  Level of Assistance Minimal assist, patient does 75% or more  Assistive Device Front wheel walker  Mobility Response Tolerated poorly  Mobility performed by Mobility specialist  $Mobility charge 1 Mobility    Pre-mobility: 90 HR, 140/65 BP Post-mobility: 90 HR, 150/78 BP  Pt c/o "feeling whoozy" when sitting up and standing.   Pricilla Handler Mobility Specialist Mobility Specialist Phone: [phone]

## 2019-12-15 NOTE — Progress Notes (Addendum)
[facility] PROGRESS NOTE    Progress Note  Nicole [name]  LPF:[id number] DOB: [birth year] DOA: 12/11/2019 PCP: [name], MD     Brief Narrative:   Nicole [name] is an 74 y.o. female past medical history significant of normocytic anemia, anxiety depression memory difficulties, migraine headaches, diabetes mellitus type 2 urinary incontinence, CAD on dual antiplatelet therapy for a stent placed in August presents to the emergency room with nausea vomiting diarrhea and dizziness.  She had a white count of 20, hemoglobin of 13 troponin is of 218, mildly elevated LFTs with a bilirubin of 1.3, there was a concern of sepsis she was started on IV Rocephin and Flagyl, culture data did remain negative till date, respiratory panel was negative.  On 12/12/2019 she was found to be tachypneic with a heart rate of 120 requiring 2 L of oxygen.  [place] biomarkers were mildly elevated EKG demonstrated lateral ST segment depression.  Cardiology was consulted she was started on IV heparin and CHF medication.  Echocardiogram was done that showed hypokinesia of the septum with an EF of 25%.  She then developed a fever 103 blood cultures were ordered to start empirically on IV vancomycin and cefepime on 12/12/2019 on 12/13/2018  her mentation had cleared. Assessment/Plan:   Severe sepsis ([place]) Blood culture data is negative till date fortunately blood cultures were obtained after antibiotics were given. Currently Zosyn and Flagyl. Infectious disease was consulted and recommended to switch her to Rocephin 1 g daily and follow-up blood cultures. Respiratory panel has been sent, her bowel movements have slowed down she has no leukocytosis has remained afebrile.  High anion gap metabolic acidosis: Likely due to sepsis resolved discontinue bicarbonate tablets.    Electrolytes abnormalities: Try to keep potassium greater than 4 magnesium greater than 2.  NSTEMI CAD with a stent placement on 8//2021,  currently on DAPT: Echocardiogram was done that showed diffuse worsening hypokinesia with an EF of 25%. Cards was consulted related elevation in troponins with marked demand ischemia in the setting of severe sepsis.  Ischemic cardiomyopathy/acute on chronic systolic heart failure: Cardiology recommended to continue current management. Be judicious with IV fluids.  Vomiting and abdominal pain: Patient refused CT scan of the abdomen and pelvis with contrast as well as prophylaxis due to her IV contrast allergy.  Acute confusional state: In the setting of sepsis or possibly benzodiazepine withdrawal. Discontinue CIWA protocol change her to her home dose regimen of benzodiazepines Ativan 1 mg twice a day.  Anxiety/Depression Resume Cymbalta.  GERD with esophagitis Continue PPI.  Mixed diabetic hyperlipidemia associated with type 2 diabetes mellitus (HCC) Controlled ranging from 73-1 29, currently on no insulin.   DVT prophylaxis: lovenox Family Communication:none Status is: Inpatient  Remains inpatient appropriate because:Hemodynamically unstable   Dispo: The patient is from: Home              Anticipated d/c is to: Home              Anticipated d/c date is: 2 days              Patient currently is not medically stable to d/c.        Code Status:     Code Status Orders  (From admission, onward)         Start     Ordered   12/12/19 0200  Full code  Continuous        12/12/19 0201        Code Status  History    Date Active Date Inactive Code Status Order ID Comments User Context   10/16/2019 2329 10/22/2019 1755 Full Code [id number]  Neena Rhymes, MD ED   06/24/2019 2046 06/28/2019 2121 Full Code [id number]  Shalhoub, Sherryll Burger, MD ED   Advance Care Planning Activity        IV Access:    Peripheral IV   Procedures and diagnostic studies:   CT HEAD WO CONTRAST  Result Date: 12/14/2019 CLINICAL DATA:  Mental status change.  Unknown cause. EXAM: CT HEAD  WITHOUT CONTRAST TECHNIQUE: Contiguous axial images were obtained from the base of the skull through the vertex without intravenous contrast. COMPARISON:  12/11/2019 FINDINGS: Brain: No evidence of acute infarction, hemorrhage, hydrocephalus, extra-axial collection or mass lesion/mass effect. Age related atrophy is noted. Vascular: No hyperdense vessel or unexpected calcification. Skull: Normal. Negative for fracture or focal lesion. Sinuses/Orbits: No acute finding. Other: None. IMPRESSION: 1. No acute intracranial abnormality. 2. Age related atrophy. Electronically Signed   By: Constance Holster M.D.   On: 12/14/2019 02:53   ECHOCARDIOGRAM LIMITED  Result Date: 12/13/2019    ECHOCARDIOGRAM LIMITED REPORT   Patient Name:   Nicole [name] Date of Exam: 12/13/2019 Medical Rec #:  [id number]       Height:       62.0 in Accession #:    [id number]      Weight:       154.5 lb Date of Birth:  10-06-1945        BSA:          1.713 m Patient Age:    7 years        BP:           97/71 mmHg Patient Gender: F               HR:           93 bpm. Exam Location:  Inpatient Procedure: Limited Echo, Color Doppler, Cardiac Doppler and Intracardiac            Opacification Agent Indications:    X44.81 Acute systolic (congestive) heart failure, NSTEMI  History:        Patient has prior history of Echocardiogram examinations, most                 recent 10/21/2019. CAD; Risk Factors:Diabetes, Dyslipidemia and                 COVID+ 04/21.  Sonographer:    Raquel Sarna Senior RDCS Referring Phys: [phone] KENNETH C HILTY IMPRESSIONS  1. EF similar to August diffuse hypokinesis worse in the septum , apex and inferior walls Basal lateral wall only area with preserved function . Left ventricular ejection fraction, by estimation, is 25 to 30%. The left ventricle has severely decreased function. The left ventricle demonstrates regional wall motion abnormalities (see scoring diagram/findings for description). The left ventricular internal cavity size  was moderately dilated. Left ventricular diastolic parameters were normal.  2. Right ventricular systolic function was not well visualized. The right ventricular size is not well visualized. There is mildly elevated pulmonary artery systolic pressure.  3. The mitral valve is normal in structure. Trivial mitral valve regurgitation. No evidence of mitral stenosis.  4. Tricuspid valve regurgitation is moderate.  5. The aortic valve is normal in structure. Aortic valve regurgitation is not visualized. Mild to moderate aortic valve sclerosis/calcification is present, without any evidence of aortic stenosis.  6. The inferior vena cava is normal  in size with greater than 50% respiratory variability, suggesting right atrial pressure of 3 mmHg. FINDINGS  Left Ventricle: EF similar to August diffuse hypokinesis worse in the septum , apex and inferior walls Basal lateral wall only area with preserved function. Left ventricular ejection fraction, by estimation, is 25 to 30%. The left ventricle has severely  decreased function. The left ventricle demonstrates regional wall motion abnormalities. Definity contrast agent was given IV to delineate the left ventricular endocardial borders. The left ventricular internal cavity size was moderately dilated. There is no left ventricular hypertrophy. Left ventricular diastolic parameters were normal. Right Ventricle: The right ventricular size is not well visualized. Right vetricular wall thickness was not assessed. Right ventricular systolic function was not well visualized. There is mildly elevated pulmonary artery systolic pressure. The tricuspid regurgitant velocity is 2.90 m/s, and with an assumed right atrial pressure of 3 mmHg, the estimated right ventricular systolic pressure is 82.9 mmHg. Left Atrium: Left atrial size was normal in size. Right Atrium: Right atrial size was normal in size. Pericardium: There is no evidence of pericardial effusion. Mitral Valve: The mitral valve is  normal in structure. Trivial mitral valve regurgitation. No evidence of mitral valve stenosis. Tricuspid Valve: The tricuspid valve is normal in structure. Tricuspid valve regurgitation is moderate . No evidence of tricuspid stenosis. Aortic Valve: The aortic valve is normal in structure. Aortic valve regurgitation is not visualized. Mild to moderate aortic valve sclerosis/calcification is present, without any evidence of aortic stenosis. Pulmonic Valve: The pulmonic valve was normal in structure. Pulmonic valve regurgitation is mild. No evidence of pulmonic stenosis. Aorta: The aortic root is normal in size and structure. Venous: The inferior vena cava is normal in size with greater than 50% respiratory variability, suggesting right atrial pressure of 3 mmHg. IAS/Shunts: No atrial level shunt detected by color flow Doppler.  LV Volumes (MOD) LV vol d, MOD A4C: 111.0 ml LV vol s, MOD A4C: 86.1 ml LV SV MOD A4C:     111.0 ml RIGHT VENTRICLE RV S prime:     6.96 cm/s TAPSE (M-mode): 1.2 cm TRICUSPID VALVE TR Peak grad:   33.6 mmHg TR Vmax:        290.00 cm/s Jenkins Rouge MD Electronically signed by Jenkins Rouge MD Signature Date/Time: 12/13/2019/11:48:45 AM    Final      Medical Consultants:    None.  Anti-Infectives:   IV Rocephin.  Subjective:    Nicole [name] she relates she feels better this morning she is hungry.  Objective:    Vitals:   12/15/19 0200 12/15/19 0300 12/15/19 0356 12/15/19 0400  BP: 130/63 135/75 137/70 (!) 146/59  Pulse: 89 88 88 85  Resp: 19 20 (!) 24 20  Temp:   98.3 F (36.8 C)   TempSrc:   Oral   SpO2: 98% 95% 100% 95%  Weight:      Height:       SpO2: 95 % O2 Flow Rate (L/min): 3 L/min   Intake/Output Summary (Last 24 hours) at 12/15/2019 0749 Last data filed at 12/15/2019 9371 Gross per 24 hour  Intake 1222.58 ml  Output 2 ml  Net 1220.58 ml   Filed Weights   12/11/19 2037  Weight: 70.1 kg    Exam: General exam: In no acute  distress. Respiratory system: Good air movement and clear to auscultation. Cardiovascular system: S1 & S2 heard, RRR. No JVD. Gastrointestinal system: Abdomen is nondistended, soft and nontender.  Central nervous system: Alert and oriented.  No focal neurological deficits. Extremities: No pedal edema. Skin: No rashes, lesions or ulcers Psychiatry: Judgement and insight appear normal. Mood & affect appropriate.    Data Reviewed:    Labs: Basic Metabolic Panel: Recent Labs  Lab 12/12/19 0737 12/12/19 0737 12/12/19 0902 12/12/19 1035 12/12/19 1035 12/12/19 1507 12/12/19 1507 12/13/19 0125 12/13/19 0125 12/13/19 1019 12/14/19 0125 12/14/19 0125 12/14/19 1549 12/15/19 0601  NA 141   < >  --  139   < > 136  --  142  --   --  139  --  141 139  K 3.0*   < >  --  3.0*   < > 3.5   < > 4.5   < >  --  5.0   < > 4.1 5.0  CL 106   < >  --  103   < > 103  --  112*  --   --  108  --  107 108  CO2 19*   < >  --  21*   < > 21*  --  22  --   --  17*  --  25 24  GLUCOSE 166*   < >  --  192*   < > 349*  --  193*  --   --  149*  --  139* 129*  BUN 7*   < >  --  8   < > 7*  --  8  --   --  10  --  10 7*  CREATININE 0.90   < >  --  1.00   < > 0.96  --  0.95  --   --  0.88  --  0.95 0.80  CALCIUM 6.7*   < >  --  6.7*   < > 6.3*  --  7.6*  --   --  7.6*  --  8.8* 9.3  MG 0.2*   < > 0.2* 0.9*  --  3.8*  --   --   --  2.1  --   --  2.0  --   PHOS 4.1  --   --  3.9  --   --   --   --   --   --   --   --   --   --    < > = values in this interval not displayed.   GFR Estimated Creatinine Clearance: 58.3 mL/min (by C-G formula based on SCr of 0.8 mg/dL). Liver Function Tests: Recent Labs  Lab 12/11/19 2117 12/12/19 1035 12/12/19 1507 12/13/19 0125  AST 32 56* 60* 64*  ALT 21 26 26 30   ALKPHOS 63 53 52 48  BILITOT 1.3* 1.1 1.0 1.1  PROT 7.2 6.1* 5.9* 6.0*  ALBUMIN 3.6 3.1* 2.9* 2.9*   No results for input(s): LIPASE, AMYLASE in the last 168 hours. Recent Labs  Lab 12/11/19 2117  12/12/19 0736  AMMONIA 55* 52*   Coagulation profile Recent Labs  Lab 12/12/19 0327  INR 1.1   COVID-19 Labs  Recent Labs    12/12/19 1838  DDIMER 0.99*    Lab Results  Component Value Date   SARSCOV2NAA NEGATIVE 12/11/2019   [place] NEGATIVE 10/16/2019    CBC: Recent Labs  Lab 12/11/19 1622 12/12/19 0736 12/12/19 1035 12/13/19 0125 12/14/19 0640  WBC 20.3* 17.6* 18.9* 13.8* 9.3  NEUTROABS  --  14.7*  --  12.5* 7.7  HGB 13.3 11.4* 12.0 10.9* 10.0*  HCT 39.4 34.1* 35.3*  32.5* 30.5*  MCV 88.7 90.0 90.7 92.6 95.3  PLT 331 254 287 185 182   Cardiac Enzymes: No results for input(s): CKTOTAL, CKMB, CKMBINDEX, TROPONINI in the last 168 hours. BNP (last 3 results) No results for input(s): PROBNP in the last 8760 hours. CBG: Recent Labs  Lab 12/14/19 0651 12/14/19 1136 12/14/19 1625 12/14/19 2128 12/15/19 0640  GLUCAP 98 165* 103* 184* 73   D-Dimer: Recent Labs    12/12/19 1838  DDIMER 0.99*   Hgb A1c: No results for input(s): HGBA1C in the last 72 hours. Lipid Profile: No results for input(s): CHOL, HDL, LDLCALC, TRIG, CHOLHDL, LDLDIRECT in the last 72 hours. Thyroid function studies: No results for input(s): TSH, T4TOTAL, T3FREE, THYROIDAB in the last 72 hours.  Invalid input(s): FREET3 Anemia work up: No results for input(s): VITAMINB12, FOLATE, FERRITIN, TIBC, IRON, RETICCTPCT in the last 72 hours. Sepsis Labs: Recent Labs  Lab 12/11/19 1622 12/11/19 2117 12/12/19 0327 12/12/19 0524 12/12/19 0736 12/12/19 1035 12/13/19 0125 12/13/19 2219 12/14/19 0125 12/14/19 0640  PROCALCITON  --   --  <0.10  --   --   --  0.94  --  0.79  --   WBC   < >  --   --   --  17.6* 18.9* 13.8*  --   --  9.3  LATICACIDVEN  --    < >  --  1.8  --   --   --  1.9 4.6* 2.3*   < > = values in this interval not displayed.   Microbiology Recent Results (from the past 240 hour(s))  Respiratory Panel by RT PCR (Flu A&B, Covid) - Nasopharyngeal Swab     Status:  None   Collection Time: 12/11/19 10:54 PM   Specimen: Nasopharyngeal Swab  Result Value Ref Range Status   SARS Coronavirus 2 by RT PCR NEGATIVE NEGATIVE Final    Comment: (NOTE) SARS-CoV-2 target nucleic acids are NOT DETECTED.  The SARS-CoV-2 RNA is generally detectable in upper respiratoy specimens during the acute phase of infection. The lowest concentration of SARS-CoV-2 viral copies this assay can detect is 131 copies/mL. A negative result does not preclude SARS-Cov-2 infection and should not be used as the sole basis for treatment or other patient management decisions. A negative result may occur with  improper specimen collection/handling, submission of specimen other than nasopharyngeal swab, presence of viral mutation(s) within the areas targeted by this assay, and inadequate number of viral copies (<131 copies/mL). A negative result must be combined with clinical observations, patient history, and epidemiological information. The expected result is Negative.  Fact Sheet for Patients:  PinkCheek.be  Fact Sheet for Healthcare Providers:  GravelBags.it  This test is no t yet approved or cleared by the [place] FDA and  has been authorized for detection and/or diagnosis of SARS-CoV-2 by FDA under an Emergency Use Authorization (EUA). This EUA will remain  in effect (meaning this test can be used) for the duration of the COVID-19 declaration under Section 564(b)(1) of the Act, 21 U.S.C. section 360bbb-3(b)(1), unless the authorization is terminated or revoked sooner.     Influenza A by PCR NEGATIVE NEGATIVE Final   Influenza B by PCR NEGATIVE NEGATIVE Final    Comment: (NOTE) The Xpert Xpress SARS-CoV-2/FLU/RSV assay is intended as an aid in  the diagnosis of influenza from Nasopharyngeal swab specimens and  should not be used as a sole basis for treatment. Nasal washings and  aspirates are unacceptable for  Xpert Xpress SARS-CoV-2/FLU/RSV  testing.  Fact Sheet for Patients: PinkCheek.be  Fact Sheet for Healthcare Providers: GravelBags.it  This test is not yet approved or cleared by the [place] FDA and  has been authorized for detection and/or diagnosis of SARS-CoV-2 by  FDA under an Emergency Use Authorization (EUA). This EUA will remain  in effect (meaning this test can be used) for the duration of the  Covid-19 declaration under Section 564(b)(1) of the Act, 21  U.S.C. section 360bbb-3(b)(1), unless the authorization is  terminated or revoked. Performed at [facility], [place] [address], [place], [place] [address]   Culture, blood (routine x 2)     Status: None (Preliminary result)   Collection Time: 12/12/19  3:06 PM   Specimen: BLOOD  Result Value Ref Range Status   Specimen Description BLOOD LEFT ANTECUBITAL  Final   Special Requests   Final    BOTTLES DRAWN AEROBIC AND ANAEROBIC Blood Culture results may not be optimal due to an inadequate volume of blood received in culture bottles   Culture   Final    NO GROWTH 3 DAYS Performed at [facility], [place] [address] [place], [place] [address]    Report Status PENDING  Incomplete  Culture, blood (routine x 2)     Status: None (Preliminary result)   Collection Time: 12/12/19  3:12 PM   Specimen: BLOOD RIGHT HAND  Result Value Ref Range Status   Specimen Description BLOOD RIGHT HAND  Final   Special Requests   Final    BOTTLES DRAWN AEROBIC ONLY Blood Culture results may not be optimal due to an inadequate volume of blood received in culture bottles   Culture   Final    NO GROWTH 3 DAYS Performed at [facility], [place] [address], [place], [place] [address]    Report Status PENDING  Incomplete  MRSA PCR Screening     Status: None   Collection Time: 12/13/19  6:20 AM   Specimen: Nasopharyngeal  Result Value Ref Range Status   MRSA by PCR NEGATIVE  NEGATIVE Final    Comment:        The GeneXpert MRSA Assay (FDA approved for NASAL specimens only), is one component of a comprehensive MRSA colonization surveillance program. It is not intended to diagnose MRSA infection nor to guide or monitor treatment for MRSA infections. Performed at [facility], [place] [address] [place], [place] [address]      Medications:   . aspirin  81 mg Oral Daily  . atorvastatin  80 mg Oral QHS  . DULoxetine  60 mg Oral Daily  . enoxaparin (LOVENOX) injection  40 mg Subcutaneous Q24H  . folic acid  1 mg Oral Daily  . LORazepam  0-4 mg Intravenous Q12H  . LORazepam  1 mg Intravenous Once  . LORazepam  1 mg Oral BID  . metoprolol tartrate  10 mg Intravenous Q8H  . mometasone-formoterol  2 puff Inhalation BID  . multivitamin with minerals  1 tablet Oral Daily  . pantoprazole (PROTONIX) IV  40 mg Intravenous Q24H  . potassium chloride SA  40 mEq Oral BID  . sacubitril-valsartan  1 tablet Oral BID  . thiamine  100 mg Oral Daily   Or  . thiamine  100 mg Intravenous Daily  . ticagrelor  90 mg Oral BID  . Vitamin D (Ergocalciferol)  50,000 Units Oral Q Sun   Continuous Infusions: . cefTRIAXone (ROCEPHIN)  IV 1 g (12/14/19 1724)  LOS: 3 days   Charlynne Cousins  [facility]  12/15/2019, 7:49 AM

## 2019-12-15 NOTE — Progress Notes (Addendum)
Progress Note  Patient Name: Nicole [name] Date of Encounter: 12/15/2019  Primary Cardiologist: Peter Martinique, MD   Subjective   Remains confused this morning. Having visual hallucinations. No complaints of chest pain. She does report some SOB which she attributes to her asthma. She was assisted into a more comfortable position as she was slumped down in the bed with her feet hanging over the edge.   Inpatient Medications    Scheduled Meds: . aspirin  81 mg Oral Daily  . atorvastatin  80 mg Oral QHS  . DULoxetine  60 mg Oral Daily  . enoxaparin (LOVENOX) injection  40 mg Subcutaneous Q24H  . folic acid  1 mg Oral Daily  . LORazepam  1 mg Oral BID  . metoprolol tartrate  10 mg Intravenous Q8H  . mometasone-formoterol  2 puff Inhalation BID  . multivitamin with minerals  1 tablet Oral Daily  . pantoprazole (PROTONIX) IV  40 mg Intravenous Q24H  . potassium chloride SA  40 mEq Oral BID  . sacubitril-valsartan  1 tablet Oral BID  . thiamine  100 mg Oral Daily   Or  . thiamine  100 mg Intravenous Daily  . ticagrelor  90 mg Oral BID  . Vitamin D (Ergocalciferol)  50,000 Units Oral Q Sun   Continuous Infusions: . cefTRIAXone (ROCEPHIN)  IV 1 g (12/14/19 1724)   PRN Meds: acetaminophen **OR** acetaminophen, bismuth subsalicylate, fluticasone, levalbuterol, morphine injection, ondansetron **OR** ondansetron (ZOFRAN) IV, oxyCODONE   Vital Signs    Vitals:   12/15/19 0356 12/15/19 0400 12/15/19 0831 12/15/19 0844  BP: 137/70 (!) 146/59  (!) 146/83  Pulse: 88 85 97 97  Resp: (!) 24 20 (!) 21 (!) 21  Temp: 98.3 F (36.8 C)   98.4 F (36.9 C)  TempSrc: Oral   Oral  SpO2: 100% 95% 100% 100%  Weight:      Height:        Intake/Output Summary (Last 24 hours) at 12/15/2019 0951 Last data filed at 12/15/2019 7622 Gross per 24 hour  Intake 1222.58 ml  Output 2 ml  Net 1220.58 ml   Filed Weights   12/11/19 2037  Weight: 70.1 kg    Telemetry    Sinus rhythm  -  Personally Reviewed  ECG    Sinus rhythm with rate 94 bpm, stable anterolateral TWI, no STE/D; no significant change from previous - Personally Reviewed  Physical Exam   GEN: No acute distress.   Neck: No JVD, no carotid bruits Cardiac: RRR, no murmurs, rubs, or gallops.  Respiratory: Clear to auscultation bilaterally, no wheezes/ rales/ rhonchi GI: NABS, Soft, nontender, non-distended  MS: No edema; No deformity. Neuro:  oriented to self, moving all extremities spontaneously Psych: visual hallucinations   Labs    Chemistry Recent Labs  Lab 12/12/19 1035 12/12/19 1035 12/12/19 1507 12/12/19 1507 12/13/19 0125 12/13/19 0125 12/14/19 0125 12/14/19 1549 12/15/19 0601  NA 139   < > 136   < > 142   < > 139 141 139  K 3.0*   < > 3.5   < > 4.5   < > 5.0 4.1 5.0  CL 103   < > 103   < > 112*   < > 108 107 108  CO2 21*   < > 21*   < > 22   < > 17* 25 24  GLUCOSE 192*   < > 349*   < > 193*   < > 149* 139* 129*  BUN 8   < > 7*   < > 8   < > 10 10 7*  CREATININE 1.00   < > 0.96   < > 0.95   < > 0.88 0.95 0.80  CALCIUM 6.7*   < > 6.3*   < > 7.6*   < > 7.6* 8.8* 9.3  PROT 6.1*  --  5.9*  --  6.0*  --   --   --   --   ALBUMIN 3.1*  --  2.9*  --  2.9*  --   --   --   --   AST 56*  --  60*  --  64*  --   --   --   --   ALT 26  --  26  --  30  --   --   --   --   ALKPHOS 53  --  52  --  48  --   --   --   --   BILITOT 1.1  --  1.0  --  1.1  --   --   --   --   GFRNONAA 56*   < > 59*   < > 60*   < > >60 60* >60  GFRAA >60   < > >60  --  >60  --  >60  --   --   ANIONGAP 15   < > 12   < > 8   < > _0 < > = values in this interval not displayed.     Hematology Recent Labs  Lab 12/12/19 1035 12/13/19 0125 12/14/19 0640  WBC 18.9* 13.8* 9.3  RBC 3.89 3.51* 3.20*  HGB 12.0 10.9* 10.0*  HCT 35.3* 32.5* 30.5*  MCV 90.7 92.6 95.3  MCH 30.8 31.1 31.3  MCHC 34.0 33.5 32.8  RDW 14.1 14.5 14.4  PLT 287 185 182    Cardiac EnzymesNo results for input(s): TROPONINI in the last  168 hours. No results for input(s): TROPIPOC in the last 168 hours.   BNPNo results for input(s): BNP, PROBNP in the last 168 hours.   DDimer  Recent Labs  Lab 12/11/19 2117 12/12/19 1838  DDIMER 0.77* 0.99*     Radiology    CT HEAD WO CONTRAST  Result Date: 12/14/2019 CLINICAL DATA:  Mental status change.  Unknown cause. EXAM: CT HEAD WITHOUT CONTRAST TECHNIQUE: Contiguous axial images were obtained from the base of the skull through the vertex without intravenous contrast. COMPARISON:  12/11/2019 FINDINGS: Brain: No evidence of acute infarction, hemorrhage, hydrocephalus, extra-axial collection or mass lesion/mass effect. Age related atrophy is noted. Vascular: No hyperdense vessel or unexpected calcification. Skull: Normal. Negative for fracture or focal lesion. Sinuses/Orbits: No acute finding. Other: None. IMPRESSION: 1. No acute intracranial abnormality. 2. Age related atrophy. Electronically Signed   By: Constance Holster M.D.   On: 12/14/2019 02:53   ECHOCARDIOGRAM LIMITED  Result Date: 12/13/2019    ECHOCARDIOGRAM LIMITED REPORT   Patient Name:   Nicole [name] Date of Exam: 12/13/2019 Medical Rec #:  [id number]       Height:       62.0 in Accession #:    [id number]      Weight:       154.5 lb Date of Birth:  [birth year]        BSA:          1.713 m Patient Age:    74 years  BP:           97/71 mmHg Patient Gender: F               HR:           93 bpm. Exam Location:  Inpatient Procedure: Limited Echo, Color Doppler, Cardiac Doppler and Intracardiac            Opacification Agent Indications:    E45.40 Acute systolic (congestive) heart failure, NSTEMI  History:        Patient has prior history of Echocardiogram examinations, most                 recent 10/21/2019. CAD; Risk Factors:Diabetes, Dyslipidemia and                 COVID+ 04/21.  Sonographer:    Raquel Sarna Senior RDCS Referring Phys: [phone] KENNETH C HILTY IMPRESSIONS  1. EF similar to August diffuse hypokinesis worse in the septum  , apex and inferior walls Basal lateral wall only area with preserved function . Left ventricular ejection fraction, by estimation, is 25 to 30%. The left ventricle has severely decreased function. The left ventricle demonstrates regional wall motion abnormalities (see scoring diagram/findings for description). The left ventricular internal cavity size was moderately dilated. Left ventricular diastolic parameters were normal.  2. Right ventricular systolic function was not well visualized. The right ventricular size is not well visualized. There is mildly elevated pulmonary artery systolic pressure.  3. The mitral valve is normal in structure. Trivial mitral valve regurgitation. No evidence of mitral stenosis.  4. Tricuspid valve regurgitation is moderate.  5. The aortic valve is normal in structure. Aortic valve regurgitation is not visualized. Mild to moderate aortic valve sclerosis/calcification is present, without any evidence of aortic stenosis.  6. The inferior vena cava is normal in size with greater than 50% respiratory variability, suggesting right atrial pressure of 3 mmHg. FINDINGS  Left Ventricle: EF similar to August diffuse hypokinesis worse in the septum , apex and inferior walls Basal lateral wall only area with preserved function. Left ventricular ejection fraction, by estimation, is 25 to 30%. The left ventricle has severely  decreased function. The left ventricle demonstrates regional wall motion abnormalities. Definity contrast agent was given IV to delineate the left ventricular endocardial borders. The left ventricular internal cavity size was moderately dilated. There is no left ventricular hypertrophy. Left ventricular diastolic parameters were normal. Right Ventricle: The right ventricular size is not well visualized. Right vetricular wall thickness was not assessed. Right ventricular systolic function was not well visualized. There is mildly elevated pulmonary artery systolic pressure. The  tricuspid regurgitant velocity is 2.90 m/s, and with an assumed right atrial pressure of 3 mmHg, the estimated right ventricular systolic pressure is 91.4 mmHg. Left Atrium: Left atrial size was normal in size. Right Atrium: Right atrial size was normal in size. Pericardium: There is no evidence of pericardial effusion. Mitral Valve: The mitral valve is normal in structure. Trivial mitral valve regurgitation. No evidence of mitral valve stenosis. Tricuspid Valve: The tricuspid valve is normal in structure. Tricuspid valve regurgitation is moderate . No evidence of tricuspid stenosis. Aortic Valve: The aortic valve is normal in structure. Aortic valve regurgitation is not visualized. Mild to moderate aortic valve sclerosis/calcification is present, without any evidence of aortic stenosis. Pulmonic Valve: The pulmonic valve was normal in structure. Pulmonic valve regurgitation is mild. No evidence of pulmonic stenosis. Aorta: The aortic root is normal in size and structure. Venous: The  inferior vena cava is normal in size with greater than 50% respiratory variability, suggesting right atrial pressure of 3 mmHg. IAS/Shunts: No atrial level shunt detected by color flow Doppler.  LV Volumes (MOD) LV vol d, MOD A4C: 111.0 ml LV vol s, MOD A4C: 86.1 ml LV SV MOD A4C:     111.0 ml RIGHT VENTRICLE RV S prime:     6.96 cm/s TAPSE (M-mode): 1.2 cm TRICUSPID VALVE TR Peak grad:   33.6 mmHg TR Vmax:        290.00 cm/s Jenkins Rouge MD Electronically signed by Jenkins Rouge MD Signature Date/Time: 12/13/2019/11:48:45 AM    Final     Cardiac Studies   Echocardiogram 10/2019: 1. Left ventricular ejection fraction, by estimation, is 20 to 25%. The  left ventricle has severely decreased function. The left ventricle  demonstrates global hypokinesis. The left ventricular internal cavity size  was mildly dilated. Left ventricular  diastolic parameters are consistent with Grade II diastolic dysfunction  (pseudonormalization).  Elevated left ventricular end-diastolic pressure.  2. Right ventricular systolic function is normal. The right ventricular  size is normal. There is normal pulmonary artery systolic pressure. The  estimated right ventricular systolic pressure is 16.1 mmHg.  3. Left atrial size was severely dilated.  4. The mitral valve is normal in structure. Mild mitral valve  regurgitation. No evidence of mitral stenosis.  5. The aortic valve is tricuspid. Aortic valve regurgitation is trivial.  Mild to moderate aortic valve sclerosis/calcification is present, without  any evidence of aortic stenosis.  6. The inferior vena cava is normal in size with greater than 50%  respiratory variability, suggesting right atrial pressure of 3 mmHg.  7. There is right bowing of the interatrial septum, suggestive of  elevated left atrial pressure. No atrial level shunt detected by color  flow Doppler.   R/LHC 10/2019:  1st Mrg lesion is 95% stenosed.  Post intervention, there is a 0% residual stenosis.  Prox RCA to Mid RCA lesion is 20% stenosed.  Prox LAD lesion is 20% stenosed.  Mid LAD lesion is 40% stenosed.  A stent was successfully placed.   Coronary obstructive disease with 20% proximal and 40% smooth mid LAD stenoses; 30% proximal circumflex stenosis with 95% near ostial stenosis in a bifurcating OM1 vessel; and 20% mid RCA narrowing.  Normal to minimally increased right heart pressures; mean PA pressure 21 mmHg  Successful PCI to 95% very proximal circumflex marginal stenosis with insertion of a 2.25 x 8 mm DES stent with the stenosis being reduced to 0%.  RECOMMENDATION: Recommend DAPT for 12 months.  Guideline directed medical therapy for the patient's significant cardiomyopathy which is out of proportion to her high-grade circumflex marginal stenosis.  Aggressive lipid-lowering therapy with target LDL less than 70.   Limited echocardiogram 12/13/19: 1. EF similar to August diffuse  hypokinesis worse in the septum , apex  and inferior walls Basal lateral wall only area with preserved function .  Left ventricular ejection fraction, by estimation, is 25 to 30%. The left  ventricle has severely decreased  function. The left ventricle demonstrates regional wall motion  abnormalities (see scoring diagram/findings for description). The left  ventricular internal cavity size was moderately dilated. Left ventricular  diastolic parameters were normal.  2. Right ventricular systolic function was not well visualized. The right  ventricular size is not well visualized. There is mildly elevated  pulmonary artery systolic pressure.  3. The mitral valve is normal in structure. Trivial mitral valve  regurgitation.  No evidence of mitral stenosis.  4. Tricuspid valve regurgitation is moderate.  5. The aortic valve is normal in structure. Aortic valve regurgitation is  not visualized. Mild to moderate aortic valve sclerosis/calcification is  present, without any evidence of aortic stenosis.  6. The inferior vena cava is normal in size with greater than 50%  respiratory variability, suggesting right atrial pressure of 3 mmHg.   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  Assessment & Plan    1. NSTEMI in patient with CAD s/p PCI/DES to [place] 10/2019: patient initially presented with N/V/D and dizziness. Found to have elevated troponins - peaked at 721 and trended down. EKG with deep anterolateral TWI's which were felt to be 2/2 her cardiomyopathy and electrolyte imbalance. Repeat limited echo this admission with EF 25-30% and nearly global LV hypokinesis. Likely elevated troponin was 2/2 demand ischemia in the setting of sepsis - Do not anticipate further ischemic evaluation this admission - Continue aspirin and  brilinta - Continue statin - Will transition IV metoprolol to po metoprolol succinate - start 42m daily (up from home 284mdaily) - can uptitrate as needed  2. Chronic combined CHF: appears euvolemic on exam and no volume overload complaints today.  - Will transition to po metoprolol as above - Continue entresto - Continue to monitor volume status closely with sepsis management - check daily weights and strict I&Os - Continue to monitor electrolytes closely and replete as needed to maintain K>4, Mg>2  3. Sepsis: unclear etiology and unfortunately BCx were collected after IV antibiotic administration. ID following and recommended transition to CTX. She continues to have AMS. She was not cooperative to perform a CT A/P this admission due to her underlying contrast allergy, not agreeable to contrast ppx - Continue management per primary team and ID - Continue management per primary team  4. HTN: BP generally elevated - Low threshold to increase entresto if BP remains elevated  - Continue metoprolol as above  5. HLD: LDL 110 10/2019 - Will repeat FLP in AM as she is due for surveillance follow-up - Continue statin   For questions or updates, please contact [facility] consult [email or web address] for contact info under Cardiology/STEMI.      Signed, KrAbigail ButtsPA-C  12/15/2019, 9:51 AM   [phone] Agree with assessment and plan by KrRoby LoftsA-C  We were asked to see this patient for non-STEMI.  She was admitted with questionable sepsis.  Her enzymes rose to 700 range.  She does have a severe LV dysfunction which is unchanged or possibly only slightly better than it was when we met last measured.  She had intervention back in August.  She continues to have anterolateral T wave inversion probably unchanged from her previous EKGs.  She denies chest pain peer exam is benign.  I agree with continue DAPT and medications for LV dysfunction.  There are no plans to perform  coronary angiography at this time.  JoLorretta HarpM.D., FASouthavenFAPoplar Community HospitalFALaverta BaltimoreSCowgill238 Olive LaneSuChurchillNC  [phone]/08/2019 12:15 PM

## 2019-12-15 NOTE — TOC Initial Note (Signed)
Transition of Care [facility]) - Initial/Assessment Note    Patient Details  Name: Nicole [name] MRN: [id number] Date of Birth: [birth year]  [facility]) CM/SW Contact:    Vinie Sill, [place] Phone Number: 12/15/2019, 4:22 PM  Clinical Narrative:                  CSW met with patient and her daughter at bedside. CSW introduced self and explained role. CSW discussed PT recommendation of short term rehab at [facility]. Patient and daughter declined SNF but was willing to consider placement. CSW was given the permission to send SNF referrals as back up plan to discharging home.CSW explained the SNF process. Patient and daughter state they will consider SNF. Patient states never been to SNF and has no preferred SNF.   CSW will continue to follow and assist with discharge planning.  Thurmond Butts, MSW, LCSWA Clinical Social Worker     Barriers to Discharge: Continued Medical Work up   Patient Goals and CMS Choice        Expected Discharge Plan and Services   In-house Referral: Clinical Social Work     Living arrangements for the past 2 months: Single Family Home                                      Prior Living Arrangements/Services Living arrangements for the past 2 months: Single Family Home Lives with:: Self, Adult Children Patient language and need for interpreter reviewed:: No        Need for Family Participation in Patient Care: Yes (Comment) Care giver support system in place?: Yes (comment)   Criminal Activity/Legal Involvement Pertinent to Current Situation/Hospitalization: No - Comment as needed  Activities of Daily Living      Permission Sought/Granted Permission sought to share information with : Family Supports Permission granted to share information with : Yes, Verbal Permission Granted  Share Information with NAME: Nicole [name]  Permission granted to share info w AGENCY: SNF  Permission granted to share info w Relationship:  daughter  Permission granted to share info w Contact Information: [phone]  Emotional Assessment Appearance:: Appears stated age Attitude/Demeanor/Rapport: Engaged   Orientation: : Oriented to Self, Oriented to Place, Oriented to  Time, Oriented to Situation Alcohol / Substance Use: Not Applicable Psych Involvement: No (comment)  Admission diagnosis:  Sinus tachycardia [R00.0] Dyspnea [R06.00] Nystagmus [H55.00] Elevated troponin [R77.8] Injury of head, initial encounter [S09.90XA] Sepsis due to undetermined organism ([place]) [A41.9] Benzodiazepine withdrawal with complication ([place]) [Q22.979] Chest pain in adult [R07.9] Non-intractable vomiting with nausea, unspecified vomiting type [R11.2] Patient Active Problem List   Diagnosis Date Noted  . Non-ST elevation (NSTEMI) myocardial infarction ([place])   . Acute respiratory failure with hypoxia ([place])   . Benzodiazepine withdrawal with complication ([place])   . Severe sepsis ([place]) 12/12/2019  . Hyperammonemia ([place]) 12/12/2019  . Chest pain in adult 12/12/2019  . Nystagmus 12/11/2019  . Leukocytosis 12/11/2019  . Chronic combined systolic and diastolic congestive heart failure ([place]) 12/11/2019  . Type 2 diabetes mellitus ([place]) 12/11/2019  . Acute combined systolic and diastolic congestive heart failure ([place]) 10/22/2019  . CAD S/P percutaneous coronary angioplasty 10/22/2019  . Aphasia 10/21/2019  . Elevated troponin   . Cardiomyopathy ([place])   . DOE (dyspnea on exertion) 10/16/2019  . Mild intermittent asthma with (acute) exacerbation 06/24/2019  . COVID-19 virus infection 06/24/2019  . Acute metabolic encephalopathy  06/24/2019  . Hypokalemia, inadequate intake 06/24/2019  . Hypomagnesemia 06/24/2019  . GERD with esophagitis   . Major depressive disorder   . Mixed diabetic hyperlipidemia associated with type 2 diabetes mellitus ([place])   . Chronic daily headache 12/07/2014  . Memory loss 08/31/2014  . Worsening headaches 08/31/2014   . Atypical chest pain 11/05/2013  . Anemia   . Anxiety   . Depression   . Diabetes mellitus type 2 in obese ([place])   . Urinary incontinence   . Endometriosis   . Asthma   . Fibromyalgia    PCP:  [name], MD Pharmacy:   [facility] [place], [place] [address] [place] [address] Phone: [phone] Fax: [place], [place] [address] [place] [address] Phone: [phone] Fax: [place], [place] [address] Phone: [phone] Fax: [phone]  [facility] [place], [place] - [address] [place] [address] Phone: [phone] Fax: [phone]     Social Determinants of Health (SDOH) Interventions    Readmission Risk Interventions Readmission Risk Prevention Plan 10/22/2019  Transportation Screening (No Data)  PCP or Specialist Appt within 3-5 Days Complete  HRI or Home Care Consult Complete  Social Work Consult for [place] Planning/Counseling Complete  Palliative Care Screening Not Applicable  Medication Review Press photographer) Complete  Some recent data might be hidden

## 2019-12-16 ENCOUNTER — Telehealth: Payer: Self-pay | Admitting: Cardiology

## 2019-12-16 DIAGNOSIS — J9601 Acute respiratory failure with hypoxia: Secondary | ICD-10-CM | POA: Diagnosis not present

## 2019-12-16 DIAGNOSIS — A419 Sepsis, unspecified organism: Secondary | ICD-10-CM | POA: Diagnosis not present

## 2019-12-16 DIAGNOSIS — F13239 Sedative, hypnotic or anxiolytic dependence with withdrawal, unspecified: Secondary | ICD-10-CM | POA: Diagnosis not present

## 2019-12-16 DIAGNOSIS — I5042 Chronic combined systolic (congestive) and diastolic (congestive) heart failure: Secondary | ICD-10-CM | POA: Diagnosis not present

## 2019-12-16 LAB — GLUCOSE, CAPILLARY
Glucose-Capillary: 119 mg/dL — ABNORMAL HIGH (ref 70–99)
Glucose-Capillary: 131 mg/dL — ABNORMAL HIGH (ref 70–99)
Glucose-Capillary: 104 mg/dL — ABNORMAL HIGH (ref 70–99)
Glucose-Capillary: 137 mg/dL — ABNORMAL HIGH (ref 70–99)
Glucose-Capillary: 170 mg/dL — ABNORMAL HIGH (ref 70–99)

## 2019-12-16 LAB — LIPID PANEL
Cholesterol: 68 mg/dL (ref 0–200)
HDL: 22 mg/dL — ABNORMAL LOW (ref >40)
LDL Cholesterol: 21 mg/dL (ref 0–99)
Total CHOL/HDL Ratio: 3.1 RATIO
Triglycerides: 123 mg/dL (ref <150)
VLDL: 25 mg/dL (ref 0–40)

## 2019-12-16 MED ORDER — SACUBITRIL-VALSARTAN 24-26 MG PO TABS: 1.0000 | ORAL_TABLET | Freq: Once | ORAL | Status: AC

## 2019-12-16 MED ORDER — SACUBITRIL-VALSARTAN 49-51 MG PO TABS
1.0000 | ORAL_TABLET | Freq: Two times a day (BID) | ORAL | Status: DC
  Administered 2019-12-16 – 2019-12-18 (×4): 1 via ORAL
  Filled 2019-12-16 (×4): qty 1

## 2019-12-16 NOTE — TOC Progression Note (Signed)
Transition of Care [facility]) - Progression Note    Patient Details  Name: JANN MILKOVICH MRN: [id number] Date of Birth: 1946-03-01  Transition of Care [facility]) CM/SW [place], [place] Phone Number: 12/16/2019, 2:09 PM  Clinical Narrative:     CSW provided bed offers to patient's daughter. They both expressed interest in [facility].  CSW contacted [facility]- they were not able to make offer.   CSW inquired with [facility]- waiting on response.  CSW encourage patient and her daughter to consider the SNFs has made bed offer. They will review and inform CSW of their choice.   CSW will continue to follow and assist with discharge planning.  Thurmond Butts, MSW, LCSWA Clinical Social Worker     Barriers to Discharge: Continued Medical Work up, SNF Pending bed offer, Ship broker, Environmental education officer)  Expected Discharge Plan and Services   In-house Referral: Clinical Social Work     Living arrangements for the past 2 months: [place]                                       Social Determinants of Health (SDOH) Interventions    Readmission Risk Interventions Readmission Risk Prevention Plan 10/22/2019  Transportation Screening (No Data)  PCP or Specialist Appt within 3-5 Days Complete  HRI or [place] Complete  Social Work Consult for [place] Planning/Counseling Complete  Palliative Care Screening Not Applicable  Medication Review Press photographer) Complete  Some recent data might be hidden

## 2019-12-16 NOTE — Telephone Encounter (Signed)
New message    Patient is in the hospital.  Daughter is calling to see if we gave pt a pneumonia shot when she had her heart procedure.  The hospital person told her they could not look that up and she would have to call the doctor.

## 2019-12-16 NOTE — Progress Notes (Signed)
CSW submitted clinicals to pasrr for review.  Sherrod Toothman LCSW

## 2019-12-16 NOTE — Progress Notes (Signed)
   Reviewed Dr. Kennon Holter note from yesterday- agree with recommendations. No plans for intervention. We will be happy to arrange follow-up after discharge.  [facility] will sign off.   Medication Recommendations:  No changes Other recommendations (labs, testing, etc):  none Follow up as an outpatient:  Dr. Martinique or APP at [facility], MD, [facility], [place] Director of the [place] of the American Board of Clinical Lipidology Attending Cardiologist  Direct Dial: [phone]  Fax: [phone]  Website:  [email or web address]

## 2019-12-16 NOTE — Progress Notes (Signed)
RE: Nicole [name] Date of Birth: 11/24/1945 Date: 12/16/19  Please be advised that the above-named patient will require a short-term nursing home stay - anticipated 30 days or less for rehabilitation and strengthening.  The plan is for return home.

## 2019-12-16 NOTE — Progress Notes (Signed)
Physical Therapy Treatment Patient Details Name: Nicole [name] MRN: [id number] DOB: [birth year] Today's Date: 12/16/2019    History of Present Illness Nicole [name] is a 74 y.o. female with medical history significant of anemia, anxiety, depression, chronic back pain, bloating, occasional blurred vision, colon polyps, dizziness, history endometriosis, fatigue, fibromyalgia, memory difficulties, GERD, headache/migraine, arthralgias, lower extremity weakness, mild intermittent asthma, hyperlipidemia, overweight, type II DM, urinary incontinence, vaginitis who was brought to the emergency department by her daughter due to nausea, vomiting, diarrhea and dizziness since Thursday.    PT Comments    Pt found supine in bed incontinent of stool with stool on hands and linens. Pt report she knew she was dirty but was unaware of how to use call bell to ask for help. Daughter in room asleep on recliner. Pt is able to roll onto both sides with min guard for pericare. Pt reports she still has to urinate. Pt is min A for coming to EoB, where she requires min A to maintain seated balance due to increased posterior lean. Needed ~1 min until she could follow single step command to hold on to bedrail to gain balance. Once steady pt requires modA for standing with therapist and total A to pivot to [facility]. Given pt's decreased awareness and difficulty sequencing, in presence of decreased balance and strength. PT continues to recommend SNF level rehab before she returns home. Discussed with daughter and she feels that she and pt's female friend will be able to move her. With further discussion of benefits of PT every day possible family will consider SNF. PT will continue to follow acutely.    Follow Up Recommendations  SNF     Equipment Recommendations  Other (comment) (TBD at next venue)       Precautions / Restrictions Precautions Precautions: Fall Restrictions Weight Bearing Restrictions: No    Mobility  Bed  Mobility Overal bed mobility: Needs Assistance Bed Mobility: Supine to Sit;Rolling Rolling: Min guard   Supine to sit: Min assist Sit to supine: Min assist   General bed mobility comments: min guard for rolling R and L for pericare, min A for brining trunk to upright, requires increased time to gain sitting balance, pt with increased posterior lean  Transfers Overall transfer level: Needs assistance Equipment used: 1 person hand held assist Transfers: Sit to/from [facility] Sit to Stand: Mod assist Stand pivot transfers: Total assist       General transfer comment: mod A for coming to standing, cuing to grab PT elbows to hold onto for power up, pt placed her arms around PT neck and then shoulders while saying "elbow", increased posterior lean, total A for pivoting to BSC, pt unable to sequence steps for stepping to [facility]  Ambulation/Gait             General Gait Details: unable         Balance Overall balance assessment: Needs assistance Sitting-balance support: Feet supported Sitting balance-Leahy Scale: Poor Sitting balance - Comments: requires min A initially for increased posterior lean with increased cuing able to use R UE on bed rail to maintiain seated balance Postural control: Posterior lean Standing balance support: During functional activity;Bilateral upper extremity supported Standing balance-Leahy Scale: Poor Standing balance comment: posterior lean with standing                            Cognition Arousal/Alertness: Awake/alert Behavior During Therapy: WFL for tasks assessed/performed Overall Cognitive  Status: Impaired/Different from baseline Area of Impairment: Safety/judgement;Awareness;Problem solving;Orientation                 Orientation Level: Disoriented to;Time       Safety/Judgement: Decreased awareness of safety;Decreased awareness of deficits Awareness: Intellectual Problem Solving: Slow  processing;Difficulty sequencing;Requires verbal cues;Requires tactile cues;Decreased initiation General Comments: pt in bed with stool all over hand, and bed linens, reports she knew she was dirty, difficulty with command follow, and sequencing          General Comments General comments (skin integrity, edema, etc.): Daughter asleep on recliner during session, VSS on RA       Pertinent Vitals/Pain Pain Assessment: Faces Faces Pain Scale: Hurts a little bit Pain Location: headache Pain Descriptors / Indicators: Headache Pain Intervention(s): Limited activity within patient's tolerance;Monitored during session;Repositioned           PT Goals (current goals can now be found in the care plan section) Acute Rehab PT Goals Patient Stated Goal: none stated PT Goal Formulation: With patient Time For Goal Achievement: 12/27/19 Potential to Achieve Goals: Fair Progress towards PT goals: Progressing toward goals    Frequency    Min 2X/week      PT Plan Current plan remains appropriate       AM-PAC PT "6 Clicks" Mobility   Outcome Measure  Help needed turning from your back to your side while in a flat bed without using bedrails?: A Little Help needed moving from lying on your back to sitting on the side of a flat bed without using bedrails?: A Little Help needed moving to and from a bed to a chair (including a wheelchair)?: Total Help needed standing up from a chair using your arms (e.g., wheelchair or bedside chair)?: Total Help needed to walk in hospital room?: Total Help needed climbing 3-5 steps with a railing? : Total 6 Click Score: 10    End of Session Equipment Utilized During Treatment: Gait belt Activity Tolerance: Patient tolerated treatment well Patient left: in bed;with call bell/phone within reach;with bed alarm set;with family/visitor present Nurse Communication: Mobility status PT Visit Diagnosis: Unsteadiness on feet (R26.81);Other abnormalities of gait  and mobility (R26.89);Difficulty in walking, not elsewhere classified (R26.2);History of falling (Z91.81)     Time: 6440-3474 PT Time Calculation (min) (ACUTE ONLY): 39 min  Charges:  $Therapeutic Activity: 38-52 mins                     Ashiya Kinkead B. Migdalia Dk PT, DPT Acute Rehabilitation Services Pager [phone] Office [phone]    [place] 12/16/2019, 9:55 AM

## 2019-12-16 NOTE — Progress Notes (Signed)
[facility] PROGRESS NOTE    Progress Note  Nicole [name]  UJW:[id number] DOB: 08-24-1945 DOA: 12/11/2019 PCP: [name], MD     Brief Narrative:   Nicole [name] is an 74 y.o. female past medical history significant of normocytic anemia, anxiety depression memory difficulties, migraine headaches, diabetes mellitus type 2 urinary incontinence, CAD on dual antiplatelet therapy for a stent placed in August presents to the emergency room with nausea vomiting diarrhea and dizziness.  She had a white count of 20, hemoglobin of 13 troponin is of 218, mildly elevated LFTs with a bilirubin of 1.3, there was a concern of sepsis she was started on IV Rocephin and Flagyl, culture data did remain negative till date, respiratory panel was negative.  On 12/12/2019 she was found to be tachypneic with a heart rate of 120 requiring 2 L of oxygen.  [place] biomarkers were mildly elevated EKG demonstrated lateral ST segment depression.  Cardiology was consulted she was started on IV heparin and CHF medication.  Echocardiogram was done that showed hypokinesia of the septum with an EF of 25%.  She then developed a fever 103 blood cultures were ordered to start empirically on IV vancomycin and cefepime on 12/12/2019 on 12/13/2018  her mentation had cleared. Assessment/Plan:   Severe sepsis ([place]) Currently on IV Rocephin, unfortunately her antibiotics were given before blood cultures. She completed a 5-day course of IV antibiotics blood cultures have been negative till date. Respiratory panel has been negative. She has no leukocytosis or diarrhea has slowed down likely due to antibiotics. She will need to follow-up at ID clinic in 2 to 4 weeks. Physical therapy evaluated the patient on 12/13/2019 and recommended skilled nursing facility. TOC has been consulted for skilled nursing facility placement. Patient is stable to be discharged to skilled nursing facility.  High anion gap metabolic acidosis: Likely  due to sepsis resolved discontinue bicarbonate tablets.    Electrolytes abnormalities: Try to keep potassium greater than 4 magnesium greater than 2.  NSTEMI CAD with a stent placement on 8//2021, currently on DAPT: Echocardiogram was done that showed diffuse worsening hypokinesia with an EF of 25%. Cards was consulted related elevation in troponins with marked demand ischemia in the setting of severe sepsis.  Ischemic cardiomyopathy/acute on chronic systolic heart failure: Cardiology recommended to continue current management. Be judicious with IV fluids.  Vomiting and abdominal pain: Patient refused CT scan of the abdomen and pelvis with contrast as well as prophylaxis due to her IV contrast allergy.  Acute confusional state: In the setting of sepsis or possibly benzodiazepine withdrawal. Discontinue CIWA protocol change her to her home dose regimen of benzodiazepines Ativan 1 mg twice a day.  Anxiety/Depression Resume Cymbalta.  GERD with esophagitis Continue PPI.  Mixed diabetic hyperlipidemia associated with type 2 diabetes mellitus (HCC) Controlled ranging from 73-1 29, currently on no insulin.   DVT prophylaxis: lovenox Family Communication:none Status is: Inpatient  Remains inpatient appropriate because:Hemodynamically unstable   Dispo: The patient is from: Home              Anticipated d/c is to: Home              Anticipated d/c date is: 2 days              Patient currently is not medically stable to d/c.        Code Status:     Code Status Orders  (From admission, onward)  Start     Ordered   12/12/19 0200  Full code  Continuous        12/12/19 0201        Code Status History    Date Active Date Inactive Code Status Order ID Comments User Context   10/16/2019 2329 10/22/2019 1755 Full Code [id number]  Neena Rhymes, MD ED   06/24/2019 2046 06/28/2019 2121 Full Code [id number]  Shalhoub, Sherryll Burger, MD ED   Advance Care Planning Activity          IV Access:    Peripheral IV   Procedures and diagnostic studies:   No results found.   Medical Consultants:    None.  Anti-Infectives:   IV Rocephin.  Subjective:    Nicole [name] no new complaints this morning feels close to baseline.  Objective:    Vitals:   12/16/19 0001 12/16/19 0349 12/16/19 0800 12/16/19 0825  BP: (!) 157/70 (!) 154/68 (!) 166/79   Pulse: 85 79 87 90  Resp: 16 (!) 21 20 (!) 22  Temp: 99 F (37.2 C) 99 F (37.2 C) 99.3 F (37.4 C)   TempSrc: Oral Oral Oral   SpO2: 97% 96% 100% 98%  Weight:      Height:       SpO2: 98 % O2 Flow Rate (L/min): 3 L/min   Intake/Output Summary (Last 24 hours) at 12/16/2019 0948 Last data filed at 12/16/2019 0600 Gross per 24 hour  Intake 250 ml  Output 1003 ml  Net -753 ml   Filed Weights   12/11/19 2037  Weight: 70.1 kg    Exam: General exam: In no acute distress. Respiratory system: Good air movement and clear to auscultation. Cardiovascular system: S1 & S2 heard, RRR. No JVD. Gastrointestinal system: Abdomen is nondistended, soft and nontender.  Extremities: No pedal edema. Skin: No rashes, lesions or ulcers  Data Reviewed:    Labs: Basic Metabolic Panel: Recent Labs  Lab 12/12/19 0737 12/12/19 0737 12/12/19 0902 12/12/19 1035 12/12/19 1035 12/12/19 1507 12/12/19 1507 12/13/19 0125 12/13/19 0125 12/13/19 1019 12/14/19 0125 12/14/19 0125 12/14/19 1549 12/15/19 0601  NA 141   < >  --  139   < > 136  --  142  --   --  139  --  141 139  K 3.0*   < >  --  3.0*   < > 3.5   < > 4.5   < >  --  5.0   < > 4.1 5.0  CL 106   < >  --  103   < > 103  --  112*  --   --  108  --  107 108  CO2 19*   < >  --  21*   < > 21*  --  22  --   --  17*  --  25 24  GLUCOSE 166*   < >  --  192*   < > 349*  --  193*  --   --  149*  --  139* 129*  BUN 7*   < >  --  8   < > 7*  --  8  --   --  10  --  10 7*  CREATININE 0.90   < >  --  1.00   < > 0.96  --  0.95  --   --  0.88  --  0.95 0.80   CALCIUM 6.7*   < >  --  6.7*   < > 6.3*  --  7.6*  --   --  7.6*  --  8.8* 9.3  MG 0.2*   < > 0.2* 0.9*  --  3.8*  --   --   --  2.1  --   --  2.0  --   PHOS 4.1  --   --  3.9  --   --   --   --   --   --   --   --   --   --    < > = values in this interval not displayed.   GFR Estimated Creatinine Clearance: 58.3 mL/min (by C-G formula based on SCr of 0.8 mg/dL). Liver Function Tests: Recent Labs  Lab 12/11/19 2117 12/12/19 1035 12/12/19 1507 12/13/19 0125  AST 32 56* 60* 64*  ALT 21 26 26 30   ALKPHOS 63 53 52 48  BILITOT 1.3* 1.1 1.0 1.1  PROT 7.2 6.1* 5.9* 6.0*  ALBUMIN 3.6 3.1* 2.9* 2.9*   No results for input(s): LIPASE, AMYLASE in the last 168 hours. Recent Labs  Lab 12/11/19 2117 12/12/19 0736  AMMONIA 55* 52*   Coagulation profile Recent Labs  Lab 12/12/19 0327  INR 1.1   COVID-19 Labs  No results for input(s): DDIMER, FERRITIN, LDH, CRP in the last 72 hours.  Lab Results  Component Value Date   SARSCOV2NAA NEGATIVE 12/11/2019   [place] NEGATIVE 10/16/2019    CBC: Recent Labs  Lab 12/11/19 1622 12/12/19 0736 12/12/19 1035 12/13/19 0125 12/14/19 0640  WBC 20.3* 17.6* 18.9* 13.8* 9.3  NEUTROABS  --  14.7*  --  12.5* 7.7  HGB 13.3 11.4* 12.0 10.9* 10.0*  HCT 39.4 34.1* 35.3* 32.5* 30.5*  MCV 88.7 90.0 90.7 92.6 95.3  PLT 331 254 287 185 182   Cardiac Enzymes: No results for input(s): CKTOTAL, CKMB, CKMBINDEX, TROPONINI in the last 168 hours. BNP (last 3 results) No results for input(s): PROBNP in the last 8760 hours. CBG: Recent Labs  Lab 12/14/19 2128 12/15/19 0640 12/15/19 1141 12/15/19 1624 12/16/19 0649  GLUCAP 184* 73 158* 162* 131*   D-Dimer: No results for input(s): DDIMER in the last 72 hours. Hgb A1c: No results for input(s): HGBA1C in the last 72 hours. Lipid Profile: Recent Labs    12/16/19 0237  CHOL 68  HDL 22*  LDLCALC 21  TRIG 123  CHOLHDL 3.1   Thyroid function studies: No results for input(s): TSH,  T4TOTAL, T3FREE, THYROIDAB in the last 72 hours.  Invalid input(s): FREET3 Anemia work up: No results for input(s): VITAMINB12, FOLATE, FERRITIN, TIBC, IRON, RETICCTPCT in the last 72 hours. Sepsis Labs: Recent Labs  Lab 12/11/19 1622 12/11/19 2117 12/12/19 0327 12/12/19 0524 12/12/19 0736 12/12/19 1035 12/13/19 0125 12/13/19 2219 12/14/19 0125 12/14/19 0640  PROCALCITON  --   --  <0.10  --   --   --  0.94  --  0.79  --   WBC   < >  --   --   --  17.6* 18.9* 13.8*  --   --  9.3  LATICACIDVEN  --    < >  --  1.8  --   --   --  1.9 4.6* 2.3*   < > = values in this interval not displayed.   Microbiology Recent Results (from the past 240 hour(s))  Respiratory Panel by RT PCR (Flu A&B, Covid) - Nasopharyngeal Swab     Status: None   Collection Time: 12/11/19  10:54 PM   Specimen: Nasopharyngeal Swab  Result Value Ref Range Status   SARS Coronavirus 2 by RT PCR NEGATIVE NEGATIVE Final    Comment: (NOTE) SARS-CoV-2 target nucleic acids are NOT DETECTED.  The SARS-CoV-2 RNA is generally detectable in upper respiratoy specimens during the acute phase of infection. The lowest concentration of SARS-CoV-2 viral copies this assay can detect is 131 copies/mL. A negative result does not preclude SARS-Cov-2 infection and should not be used as the sole basis for treatment or other patient management decisions. A negative result may occur with  improper specimen collection/handling, submission of specimen other than nasopharyngeal swab, presence of viral mutation(s) within the areas targeted by this assay, and inadequate number of viral copies (<131 copies/mL). A negative result must be combined with clinical observations, patient history, and epidemiological information. The expected result is Negative.  Fact Sheet for Patients:  PinkCheek.be  Fact Sheet for Healthcare Providers:  GravelBags.it  This test is no t yet approved  or cleared by the [place] FDA and  has been authorized for detection and/or diagnosis of SARS-CoV-2 by FDA under an Emergency Use Authorization (EUA). This EUA will remain  in effect (meaning this test can be used) for the duration of the COVID-19 declaration under Section 564(b)(1) of the Act, 21 U.S.C. section 360bbb-3(b)(1), unless the authorization is terminated or revoked sooner.     Influenza A by PCR NEGATIVE NEGATIVE Final   Influenza B by PCR NEGATIVE NEGATIVE Final    Comment: (NOTE) The Xpert Xpress SARS-CoV-2/FLU/RSV assay is intended as an aid in  the diagnosis of influenza from Nasopharyngeal swab specimens and  should not be used as a sole basis for treatment. Nasal washings and  aspirates are unacceptable for Xpert Xpress SARS-CoV-2/FLU/RSV  testing.  Fact Sheet for Patients: PinkCheek.be  Fact Sheet for Healthcare Providers: GravelBags.it  This test is not yet approved or cleared by the [place] FDA and  has been authorized for detection and/or diagnosis of SARS-CoV-2 by  FDA under an Emergency Use Authorization (EUA). This EUA will remain  in effect (meaning this test can be used) for the duration of the  Covid-19 declaration under Section 564(b)(1) of the Act, 21  U.S.C. section 360bbb-3(b)(1), unless the authorization is  terminated or revoked. Performed at [facility], [place] [address], [place], [place] [address]   Culture, blood (routine x 2)     Status: None (Preliminary result)   Collection Time: 12/12/19  3:06 PM   Specimen: BLOOD  Result Value Ref Range Status   Specimen Description BLOOD LEFT ANTECUBITAL  Final   Special Requests   Final    BOTTLES DRAWN AEROBIC AND ANAEROBIC Blood Culture results may not be optimal due to an inadequate volume of blood received in culture bottles   Culture   Final    NO GROWTH 3 DAYS Performed at [facility], [place] [address]  [place], [place] [address]    Report Status PENDING  Incomplete  Culture, blood (routine x 2)     Status: None (Preliminary result)   Collection Time: 12/12/19  3:12 PM   Specimen: BLOOD RIGHT HAND  Result Value Ref Range Status   Specimen Description BLOOD RIGHT HAND  Final   Special Requests   Final    BOTTLES DRAWN AEROBIC ONLY Blood Culture results may not be optimal due to an inadequate volume of blood received in culture bottles   Culture   Final    NO GROWTH  3 DAYS Performed at [facility], [place] [address], [place], [place] [address]    Report Status PENDING  Incomplete  MRSA PCR Screening     Status: None   Collection Time: 12/13/19  6:20 AM   Specimen: Nasopharyngeal  Result Value Ref Range Status   MRSA by PCR NEGATIVE NEGATIVE Final    Comment:        The GeneXpert MRSA Assay (FDA approved for NASAL specimens only), is one component of a comprehensive MRSA colonization surveillance program. It is not intended to diagnose MRSA infection nor to guide or monitor treatment for MRSA infections. Performed at [facility], [place] [address], [place], [place] [address]   Respiratory Panel by PCR     Status: None   Collection Time: 12/15/19  9:21 AM   Specimen: Nasopharyngeal Swab; Respiratory  Result Value Ref Range Status   Adenovirus NOT DETECTED NOT DETECTED Final   Coronavirus 229E NOT DETECTED NOT DETECTED Final    Comment: (NOTE) The Coronavirus on the Respiratory Panel, DOES NOT test for the novel  Coronavirus (2019 nCoV)    Coronavirus HKU1 NOT DETECTED NOT DETECTED Final   Coronavirus NL63 NOT DETECTED NOT DETECTED Final   Coronavirus OC43 NOT DETECTED NOT DETECTED Final   Metapneumovirus NOT DETECTED NOT DETECTED Final   Rhinovirus / Enterovirus NOT DETECTED NOT DETECTED Final   Influenza A NOT DETECTED NOT DETECTED Final   Influenza B NOT DETECTED NOT DETECTED Final   Parainfluenza Virus 1 NOT DETECTED NOT DETECTED Final   Parainfluenza Virus 2 NOT  DETECTED NOT DETECTED Final   Parainfluenza Virus 3 NOT DETECTED NOT DETECTED Final   Parainfluenza Virus 4 NOT DETECTED NOT DETECTED Final   Respiratory Syncytial Virus NOT DETECTED NOT DETECTED Final   Bordetella pertussis NOT DETECTED NOT DETECTED Final   Chlamydophila pneumoniae NOT DETECTED NOT DETECTED Final   Mycoplasma pneumoniae NOT DETECTED NOT DETECTED Final    Comment: Performed at [facility], [place]. [address] [place], [place] [address]     Medications:    aspirin  81 mg Oral Daily   atorvastatin  80 mg Oral QHS   DULoxetine  60 mg Oral Daily   enoxaparin (LOVENOX) injection  40 mg Subcutaneous T73U   folic acid  1 mg Oral Daily   LORazepam  1 mg Oral BID   metoprolol succinate  50 mg Oral Daily   mometasone-formoterol  2 puff Inhalation BID   multivitamin with minerals  1 tablet Oral Daily   pantoprazole (PROTONIX) IV  40 mg Intravenous Q24H   sacubitril-valsartan  1 tablet Oral BID   thiamine  100 mg Oral Daily   Or   thiamine  100 mg Intravenous Daily   ticagrelor  90 mg Oral BID   Vitamin D (Ergocalciferol)  50,000 Units Oral Q Sun   Continuous Infusions:     LOS: 4 days   Charlynne Cousins  [facility]  12/16/2019, 9:48 AM

## 2019-12-16 NOTE — Telephone Encounter (Signed)
Spoke with daughter . Informed daughter that pneumonia  Vaccine has not been given to patient while in the hospital .   Patient is admitted to hospital today . Daughter indicated that she would like the patient to see Dr Martinique once discharge. Daughter had original called to cancel appointment with Denyse Amass due to the patient being in the hospital. RN informed daughter before discharge next available appointment will be given in the time limit that is needed. Daughter verbalized understanding.

## 2019-12-16 NOTE — Progress Notes (Signed)
Heart Failure Stewardship Pharmacist Progress Note   PCP: [name], MD PCP-Cardiologist: Peter Martinique, MD    HPI:  74 yo F with PMH of CAD, CHF, type 2 diabetes, and HLD. She presented to the ED on 12/11/19 with dizziness and emesis. She was admitted for severe sepsis and is receiving IV antibiotics. She recently was hospitalized for NSTEMI in 10/2019. An ECHO was that showed LVEF 20-25% at that time. A repeat ECHO was done on 12/13/19 and LVEF slightly improved to 25-30%.  Current HF Medications: Metoprolol XL 50 mg daily Entresto 24/26 mg BID  Prior to admission HF Medications: Metoprolol XL 25 mg daily Entresto 24/26 mg BID  Pertinent Lab Values: . No BMET on 10/7 . As of 10/6: Serum creatinine 0.8, BUN 7, Potassium 5, Sodium 139, Magnesium 2   Vital Signs: . Weight: no weight documented since 10/2 (admission weight: 154 lbs) . Blood pressure: 150/80s  . Heart rate: 90s   Medication Assistance / Insurance Benefits Check: Does the patient have prescription insurance?  Yes Type of insurance plan: [place] from [place]:  Prior to admission outpatient pharmacy: [facility] Is the patient willing to use [facility] at discharge? Yes Is the patient willing to transition their outpatient pharmacy to utilize a [facility] outpatient pharmacy?   No - insurance prefers mail order   Assessment: 1. Chronic systolic CHF (EF 10-31%), due to ICM. NYHA class II symptoms. BP elevated. - Continue metoprolol XL 50 mg daily - Entresto 24/26 mg BID - consider increasing to 49/51 mg BID - Consider starting spironolactone, however, was up to 5 yesterday and KCl was discontinued. No repeat BMET done today. but she is also receiving KCl 40 mEq BID. Consider starting spironolactone 25 mg daily as outpatient   Plan: 1) Medication changes recommended at this time: - Increase Entresto to 49/51 mg BID  2) Patient assistance application(s): - None pending  3)   Education  - To be completed prior to discharge  Kerby Nora, PharmD, BCPS Heart Failure Stewardship Pharmacist Phone [phone]

## 2019-12-16 NOTE — Progress Notes (Signed)
[facility]  Date of Admission:  12/11/2019      Reason for Consult: sepsis, ams                                 Referring Provider: Karie Kirks Primary Care Provider: [name]    Lines:  Peripheral IV's  Abx: 10/3-c piptazo  10/3-10/5 vancomycin 10/3 ceftriaxone                                                             Assessment: 74 yo female intermittent asthma, combined chf, recent 10/2019 ptca, anxiety/fibromyalgia, gerd, chronic lorazepam admitted for sepsis in setting 2 days n/v/diarrhea. Of note had prior covid 06/2019 not intubated; received monoclonal ab but admitted and never intubated; had moderna vaccine series 1 a week prior to admission  Unfortunately blood cx obtained after abx given.   A few theories to be entertained, suspect in setting poor cardiac function and polypharmacy had n/v. Again diarrhea hard to explain. On admission developed AMS and had aspiration pneumonitis with rising procalcitonin. Rat exposure but no strong clinical evidence rat bite fever  Respiratory panel pcr negative  --- Doing well off abx Pending rehab discharge  Plan: 1. ID f/u appointment with me 10/27 @ 315 pm 2.  Will sign off  Principal Problem:   Severe sepsis (HCC) Active Problems:   Anxiety   Depression   GERD with esophagitis   Mixed diabetic hyperlipidemia associated with type 2 diabetes mellitus (HCC)   Elevated troponin   CAD S/P percutaneous coronary angioplasty   Nystagmus   Chronic combined systolic and diastolic congestive heart failure (HCC)   Type 2 diabetes mellitus (HCC)   Hyperammonemia (HCC)   Chest pain in adult   Acute respiratory failure with hypoxia (HCC)   Benzodiazepine withdrawal with complication (HCC)   Non-ST elevation (NSTEMI) myocardial infarction ([facility])   Scheduled Meds: . aspirin  81 mg Oral Daily  . atorvastatin  80 mg Oral QHS  . DULoxetine  60 mg Oral Daily  . enoxaparin  (LOVENOX) injection  40 mg Subcutaneous Q24H  . folic acid  1 mg Oral Daily  . LORazepam  1 mg Oral BID  . metoprolol succinate  50 mg Oral Daily  . mometasone-formoterol  2 puff Inhalation BID  . multivitamin with minerals  1 tablet Oral Daily  . pantoprazole (PROTONIX) IV  40 mg Intravenous Q24H  . sacubitril-valsartan  1 tablet Oral Once  . sacubitril-valsartan  1 tablet Oral BID  . thiamine  100 mg Oral Daily   Or  . thiamine  100 mg Intravenous Daily  . ticagrelor  90 mg Oral BID  . Vitamin D (Ergocalciferol)  50,000 Units Oral Q Sun   Continuous Infusions: PRN Meds:.acetaminophen **OR** acetaminophen, bismuth subsalicylate, fluticasone, haloperidol lactate, levalbuterol, morphine injection, ondansetron **OR** ondansetron (ZOFRAN) IV, oxyCODONE   SUBJECTIVE: No fever bp on higher side today No AMS No n/v/diarrhea Much better oral tolerance  Pending snf discharge for rehab   Review of Systems: Negative 11 point ros unless mentioned   Allergies  Allergen Reactions  . Fish-Derived Products Anaphylaxis  . Shellfish Allergy Shortness Of Breath    In  dye for scan.  . Aspirin Nausea Only and Other (See Comments)    Pt stated it only gives her problems when she takes the high dose (325 mg)  . Ivp Dye [Iodinated Diagnostic Agents]     Hx of ? anaphlaxis  . Latex Other (See Comments)    Pt says she gets a "stinky infection"    OBJECTIVE: Vitals:   12/16/19 0349 12/16/19 0800 12/16/19 0825 12/16/19 1100  BP: (!) 154/68 (!) 166/79  (!) 170/86  Pulse: 79 87 90 91  Resp: (!) 21 20 (!) 22 19  Temp: 99 F (37.2 C) 99.3 F (37.4 C)  98.2 F (36.8 C)  TempSrc: Oral Oral  Oral  SpO2: 96% 100% 98% 98%  Weight:      Height:       Body mass index is 28.27 kg/m.  Physical Exam Vitals reviewed.  Constitutional:      Appearance: Normal appearance.  HENT:     Head: Normocephalic.     Mouth/Throat:     Mouth: Mucous membranes are moist.  Eyes:     Extraocular  Movements: Extraocular movements intact.     Pupils: Pupils are equal, round, and reactive to light.  Cardiovascular:     Rate and Rhythm: Normal rate and regular rhythm.     Heart sounds: Normal heart sounds.  Pulmonary:     Effort: Pulmonary effort is normal.     Breath sounds: Normal breath sounds.  Abdominal:     Palpations: Abdomen is soft.     Tenderness: There is no abdominal tenderness.  Musculoskeletal:     Cervical back: Neck supple.  Skin:    General: Skin is warm.  Neurological:     General: No focal deficit present.     Mental Status: She is alert.  Psychiatric:        Mood and Affect: Mood normal.        Behavior: Behavior normal.     Lab Results Lab Results  Component Value Date   WBC 9.3 12/14/2019   HGB 10.0 (L) 12/14/2019   HCT 30.5 (L) 12/14/2019   MCV 95.3 12/14/2019   PLT 182 12/14/2019    Lab Results  Component Value Date   CREATININE 0.80 12/15/2019   BUN 7 (L) 12/15/2019   NA 139 12/15/2019   K 5.0 12/15/2019   CL 108 12/15/2019   CO2 24 12/15/2019    Lab Results  Component Value Date   ALT 30 12/13/2019   AST 64 (H) 12/13/2019   ALKPHOS 48 12/13/2019   BILITOT 1.1 12/13/2019     Microbiology: Recent Results (from the past 240 hour(s))  Respiratory Panel by RT PCR (Flu A&B, Covid) - Nasopharyngeal Swab     Status: None   Collection Time: 12/11/19 10:54 PM   Specimen: Nasopharyngeal Swab  Result Value Ref Range Status   SARS Coronavirus 2 by RT PCR NEGATIVE NEGATIVE Final    Comment: (NOTE) SARS-CoV-2 target nucleic acids are NOT DETECTED.  The SARS-CoV-2 RNA is generally detectable in upper respiratoy specimens during the acute phase of infection. The lowest concentration of SARS-CoV-2 viral copies this assay can detect is 131 copies/mL. A negative result does not preclude SARS-Cov-2 infection and should not be used as the sole basis for treatment or other patient management decisions. A negative result may occur with  improper  specimen collection/handling, submission of specimen other than nasopharyngeal swab, presence of viral mutation(s) within the areas targeted by this assay, and inadequate  number of viral copies (<131 copies/mL). A negative result must be combined with clinical observations, patient history, and epidemiological information. The expected result is Negative.  Fact Sheet for Patients:  PinkCheek.be  Fact Sheet for Healthcare Providers:  GravelBags.it  This test is no t yet approved or cleared by the [place] FDA and  has been authorized for detection and/or diagnosis of SARS-CoV-2 by FDA under an Emergency Use Authorization (EUA). This EUA will remain  in effect (meaning this test can be used) for the duration of the COVID-19 declaration under Section 564(b)(1) of the Act, 21 U.S.C. section 360bbb-3(b)(1), unless the authorization is terminated or revoked sooner.     Influenza A by PCR NEGATIVE NEGATIVE Final   Influenza B by PCR NEGATIVE NEGATIVE Final    Comment: (NOTE) The Xpert Xpress SARS-CoV-2/FLU/RSV assay is intended as an aid in  the diagnosis of influenza from Nasopharyngeal swab specimens and  should not be used as a sole basis for treatment. Nasal washings and  aspirates are unacceptable for Xpert Xpress SARS-CoV-2/FLU/RSV  testing.  Fact Sheet for Patients: PinkCheek.be  Fact Sheet for Healthcare Providers: GravelBags.it  This test is not yet approved or cleared by the [place] FDA and  has been authorized for detection and/or diagnosis of SARS-CoV-2 by  FDA under an Emergency Use Authorization (EUA). This EUA will remain  in effect (meaning this test can be used) for the duration of the  Covid-19 declaration under Section 564(b)(1) of the Act, 21  U.S.C. section 360bbb-3(b)(1), unless the authorization is  terminated or revoked. Performed at  [facility], [place] [address] [place], [place] [address]   Culture, blood (routine x 2)     Status: None (Preliminary result)   Collection Time: 12/12/19  3:06 PM   Specimen: BLOOD  Result Value Ref Range Status   Specimen Description BLOOD LEFT ANTECUBITAL  Final   Special Requests   Final    BOTTLES DRAWN AEROBIC AND ANAEROBIC Blood Culture results may not be optimal due to an inadequate volume of blood received in culture bottles   Culture   Final    NO GROWTH 3 DAYS Performed at [facility], [place] [address], [place], [place] [address]    Report Status PENDING  Incomplete  Culture, blood (routine x 2)     Status: None (Preliminary result)   Collection Time: 12/12/19  3:12 PM   Specimen: BLOOD RIGHT HAND  Result Value Ref Range Status   Specimen Description BLOOD RIGHT HAND  Final   Special Requests   Final    BOTTLES DRAWN AEROBIC ONLY Blood Culture results may not be optimal due to an inadequate volume of blood received in culture bottles   Culture   Final    NO GROWTH 3 DAYS Performed at [facility], [place] [address], [place], [place] [address]    Report Status PENDING  Incomplete  MRSA PCR Screening     Status: None   Collection Time: 12/13/19  6:20 AM   Specimen: Nasopharyngeal  Result Value Ref Range Status   MRSA by PCR NEGATIVE NEGATIVE Final    Comment:        The GeneXpert MRSA Assay (FDA approved for NASAL specimens only), is one component of a comprehensive MRSA colonization surveillance program. It is not intended to diagnose MRSA infection nor to guide or monitor treatment for MRSA infections. Performed at [facility], [place] [address], [place], [place] [address]  Respiratory Panel by PCR     Status: None   Collection Time: 12/15/19  9:21 AM   Specimen: Nasopharyngeal Swab; Respiratory  Result Value Ref Range Status   Adenovirus NOT DETECTED NOT DETECTED Final   Coronavirus 229E NOT DETECTED NOT DETECTED Final    Comment:  (NOTE) The Coronavirus on the Respiratory Panel, DOES NOT test for the novel  Coronavirus (2019 nCoV)    Coronavirus HKU1 NOT DETECTED NOT DETECTED Final   Coronavirus NL63 NOT DETECTED NOT DETECTED Final   Coronavirus OC43 NOT DETECTED NOT DETECTED Final   Metapneumovirus NOT DETECTED NOT DETECTED Final   Rhinovirus / Enterovirus NOT DETECTED NOT DETECTED Final   Influenza A NOT DETECTED NOT DETECTED Final   Influenza B NOT DETECTED NOT DETECTED Final   Parainfluenza Virus 1 NOT DETECTED NOT DETECTED Final   Parainfluenza Virus 2 NOT DETECTED NOT DETECTED Final   Parainfluenza Virus 3 NOT DETECTED NOT DETECTED Final   Parainfluenza Virus 4 NOT DETECTED NOT DETECTED Final   Respiratory Syncytial Virus NOT DETECTED NOT DETECTED Final   Bordetella pertussis NOT DETECTED NOT DETECTED Final   Chlamydophila pneumoniae NOT DETECTED NOT DETECTED Final   Mycoplasma pneumoniae NOT DETECTED NOT DETECTED Final    Comment: Performed at [facility], [place]. [address], [place], [place] [address]       Jamelle Goldston T Anavey [place], [facility] [phone] pager    12/16/2019, 12:57 PM

## 2019-12-17 ENCOUNTER — Ambulatory Visit: Payer: Medicare Other | Admitting: General Practice

## 2019-12-17 ENCOUNTER — Other Ambulatory Visit (HOSPITAL_COMMUNITY): Payer: Self-pay | Admitting: Internal Medicine

## 2019-12-17 DIAGNOSIS — E7220 Disorder of urea cycle metabolism, unspecified: Secondary | ICD-10-CM

## 2019-12-17 DIAGNOSIS — R112 Nausea with vomiting, unspecified: Secondary | ICD-10-CM

## 2019-12-17 DIAGNOSIS — F13239 Sedative, hypnotic or anxiolytic dependence with withdrawal, unspecified: Secondary | ICD-10-CM | POA: Diagnosis not present

## 2019-12-17 DIAGNOSIS — I5042 Chronic combined systolic (congestive) and diastolic (congestive) heart failure: Secondary | ICD-10-CM | POA: Diagnosis not present

## 2019-12-17 DIAGNOSIS — J9601 Acute respiratory failure with hypoxia: Secondary | ICD-10-CM | POA: Diagnosis not present

## 2019-12-17 DIAGNOSIS — A419 Sepsis, unspecified organism: Secondary | ICD-10-CM | POA: Diagnosis not present

## 2019-12-17 DIAGNOSIS — R000 Tachycardia, unspecified: Secondary | ICD-10-CM

## 2019-12-17 LAB — GLUCOSE, CAPILLARY
Glucose-Capillary: 109 mg/dL — ABNORMAL HIGH (ref 70–99)
Glucose-Capillary: 100 mg/dL — ABNORMAL HIGH (ref 70–99)
Glucose-Capillary: 129 mg/dL — ABNORMAL HIGH (ref 70–99)
Glucose-Capillary: 131 mg/dL — ABNORMAL HIGH (ref 70–99)
Glucose-Capillary: 136 mg/dL — ABNORMAL HIGH (ref 70–99)

## 2019-12-17 LAB — CULTURE, BLOOD (ROUTINE X 2)
Culture: NO GROWTH
Culture: NO GROWTH

## 2019-12-17 MED ORDER — SACUBITRIL-VALSARTAN 49-51 MG PO TABS: 1.0000 | ORAL_TABLET | Freq: Two times a day (BID) | ORAL | 3 refills | Status: DC

## 2019-12-17 MED ORDER — FUROSEMIDE 40 MG PO TABS
40.0000 mg | ORAL_TABLET | Freq: Every day | ORAL | Status: DC
  Administered 2019-12-17 – 2019-12-18 (×2): 40 mg via ORAL
  Filled 2019-12-17 (×2): qty 1

## 2019-12-17 MED ORDER — FUROSEMIDE 40 MG PO TABS: 40.0000 mg | ORAL_TABLET | Freq: Every day | ORAL | 0 refills | Status: DC

## 2019-12-17 MED ORDER — METOPROLOL SUCCINATE ER 50 MG PO TB24
50.0000 mg | ORAL_TABLET | Freq: Every day | ORAL | 0 refills | Status: DC
Start: 2019-12-18 — End: 2020-03-01

## 2019-12-17 MED FILL — METOPROLOL SUCCINATE ER 50: 50 | 30 days supply | Qty: 30 | Fill #0

## 2019-12-17 MED FILL — FUROSEMIDE 40 MG TABLET: 40 | 30 days supply | Qty: 30 | Fill #0

## 2019-12-17 MED FILL — ENTRESTO 49 MG-51 MG TABLET: 49-51 | 30 days supply | Qty: 60 | Fill #0

## 2019-12-17 NOTE — Plan of Care (Signed)
Continue to monitor

## 2019-12-17 NOTE — Care Management Important Message (Signed)
Important Message  Patient Details  Name: Nicole [name] MRN: [id number] Date of Birth: Dec 02, 1945   Medicare Important Message Given:  Yes     Shelda Altes 12/17/2019, 8:52 AM

## 2019-12-17 NOTE — Progress Notes (Signed)
Heart Failure Stewardship Pharmacist Progress Note   PCP: [name], MD PCP-Cardiologist: Peter Martinique, MD    HPI:  74 yo F with PMH of CAD, CHF, type 2 diabetes, and HLD. She presented to the ED on 12/11/19 with dizziness and emesis. She was admitted for severe sepsis and is receiving IV antibiotics. She recently was hospitalized for NSTEMI in 10/2019. An ECHO was that showed LVEF 20-25% at that time. A repeat ECHO was done on 12/13/19 and LVEF slightly improved to 25-30%.  Current HF Medications: Metoprolol XL 50 mg daily Entresto 49/51 mg BID  Prior to admission HF Medications: Metoprolol XL 25 mg daily Entresto 24/26 mg BID  Pertinent Lab Values:  No BMET on 10/7 or 10/8  As of 10/6: Serum creatinine 0.8, BUN 7, Potassium 5, Sodium 139, Magnesium 2   Vital Signs:  Weight: no weight documented since 10/2 (admission weight: 154 lbs)  Blood pressure: 130-150/80s   Heart rate: 80-90s   Medication Assistance / Insurance Benefits Check: Does the patient have prescription insurance?  Yes Type of insurance plan: [place] from [place]:  Prior to admission outpatient pharmacy: [facility] Is the patient willing to use [facility] pharmacy at discharge? Yes Is the patient willing to transition their outpatient pharmacy to utilize a [facility] outpatient pharmacy?   No - insurance prefers mail order   Assessment: 1. Chronic systolic CHF (EF 35-70%), due to ICM. NYHA class II symptoms. BP elevated. - Continue metoprolol XL 50 mg daily - Continue Entresto 49/51 mg BID - dose increased yesterday - Consider starting spironolactone, however, was up to 5 on last check and KCl was discontinued. No repeat BMET since. Consider starting spironolactone 25 mg daily as outpatient pending K trends.   Plan: 1) Medication changes recommended at this time: - No changes, discharge today  3) Education (with patient and patient's daughter) - Patient has been  educated on current HF medications (Entresto, metoprolol) and potential additions to HF medication regimen (spironolactone, SGLT2i) - Patient verbalizes understanding that over the next few months, these medication doses may change and more medications may be added to optimize HF regimen - Patient has been educated on basic disease state pathophysiology and goals of therapy - Time spent (30 min)  Kerby Nora, PharmD, BCPS Heart Failure Stewardship Pharmacist Phone [phone]

## 2019-12-17 NOTE — Discharge Summary (Addendum)
Physician Discharge Summary  Nicole [name] AUQ:[id number] DOB: Apr 23, 1945 DOA: 12/11/2019  PCP: [name], MD  Admit date: 12/11/2019 Discharge date: 12/17/2019  Admitted From: Home Disposition:  SNF  Recommendations for Outpatient Follow-up:  1. Follow up with PCP in 1-2 weeks   Home Health:No Equipment/Devices:None  Discharge Condition:Stable CODE STATUS:Full Diet recommendation: Heart Healthy   Brief/Interim Summary: 75 y.o. female past medical history significant of normocytic anemia, anxiety depression memory difficulties, migraine headaches, diabetes mellitus type 2 urinary incontinence, CAD on dual antiplatelet therapy for a stent placed in August presents to the emergency room with nausea vomiting diarrhea and dizziness.  She had a white count of 20, hemoglobin of 13 troponin is of 218, mildly elevated LFTs with a bilirubin of 1.3, there was a concern of sepsis she was started on IV Rocephin and Flagyl, culture data did remain negative till date, respiratory panel was negative.  On 12/12/2019 she was found to be tachypneic with a heart rate of 120 requiring 2 L of oxygen.    Cardiac biomarkers were mildly elevated EKG demonstrated lateral ST segment depression.  Cardiology was consulted she was started on IV heparin and CHF medication.  Echocardiogram was done that showed hypokinesia of the septum with an EF of 25%.  She then developed a fever 103 blood cultures were ordered to start empirically on IV vancomycin and cefepime on 12/12/2019 on 12/13/2018  her mentation had cleared.  Discharge Diagnoses:  Principal Problem:   Severe sepsis ([place]) Active Problems:   Anxiety   Depression   GERD with esophagitis   Mixed diabetic hyperlipidemia associated with type 2 diabetes mellitus (HCC)   Elevated troponin   CAD S/P percutaneous coronary angioplasty   Nystagmus   Chronic combined systolic and diastolic congestive heart failure (HCC)   Type 2 diabetes mellitus (HCC)    Hyperammonemia (HCC)   Chest pain in adult   Acute respiratory failure with hypoxia (HCC)   Benzodiazepine withdrawal with complication (HCC)   Non-ST elevation (NSTEMI) myocardial infarction ([place])  Severe sepsis of unknown source: Unfortunately blood cultures were obtained after IV antibiotics. On admission she was started on IV empiric antibiotics which were deescalated to IV Rocephin which she will complete her 5-day course. Respiratory panel remained negative she had no leukocytosis, after starting of antibiotic she started having diarrhea but it did slow down now has resolved. ID was consulted recommended follow-up appointment in 2 to 4 weeks. Physical therapy evaluated the patient and recommended skilled nursing facility.  High anion gap metabolic acidosis: Likely due to sepsis resolved.  Electrolyte imbalance: They were repleted orally and improved potassium was kept above 4 magnesium greater than 2.  Elevated troponins with stent placement on 10/29/2019: She was continue dual antiplatelet therapy 2D echo was done that showed an EF of 25%.  Cardiology was consulted they recommended no further interventions continue current management.  Ischemic cardiomyopathy/acute on chronic systolic heart failure: Cardiology recommended to continue current management. She is up about 4 L we will start her on oral Lasix which should continue as an outpatient will follow up with cardiology in 1 week recheck basic metabolic panel at that time. Her entresto dose was increase, Bp tolerate it well.  Vomiting and abdominal pain: She refused CT scan of the abdomen pelvis with contrast due to her allergies is resolved spontaneously after having bowel movements.  Acute confusional state: The setting of sepsis or possible benzodiazepine withdrawals, there is resolved.  Goal No change made to her medication.  Mixed diabetes hyperlipidemia associated with diabetes type 2: Continue statins continue  current regimen no changes made.  Discharge Instructions  Discharge Instructions    Diet - low sodium heart healthy   Complete by: As directed    Increase activity slowly   Complete by: As directed      Allergies as of 12/17/2019      Reactions   Fish-derived Products Anaphylaxis   Shellfish Allergy Shortness Of Breath   In dye for scan.   Aspirin Nausea Only, Other (See Comments)   Pt stated it only gives her problems when she takes the high dose (325 mg)   Ivp Dye [iodinated Diagnostic Agents]    Hx of ? anaphlaxis   Latex Other (See Comments)   Pt says she gets a "stinky infection"      Medication List    STOP taking these medications   sacubitril-valsartan 24-26 MG Commonly known as: ENTRESTO Replaced by: sacubitril-valsartan 49-51 MG     TAKE these medications   Advair Diskus 250-50 MCG/DOSE Aepb Generic drug: Fluticasone-Salmeterol Inhale 1 puff into the lungs 2 (two) times daily.   aspirin 81 MG tablet Take 81 mg by mouth daily.   atorvastatin 80 MG tablet Commonly known as: LIPITOR Take 1 tablet (80 mg total) by mouth at bedtime.   bismuth subsalicylate 324 MW/10UV suspension Commonly known as: PEPTO BISMOL Take 30 mLs by mouth every 6 (six) hours as needed for indigestion.   DULoxetine 60 MG capsule Commonly known as: CYMBALTA Take 60 mg by mouth daily.   ergocalciferol 1.25 MG (50000 UT) capsule Commonly known as: VITAMIN D2 Take 50,000 Units by mouth once a week.   fluticasone 50 MCG/ACT nasal spray Commonly known as: FLONASE Place 1 spray into both nostrils daily as needed for allergies or rhinitis.   furosemide 40 MG tablet Commonly known as: LASIX Take 1 tablet (40 mg total) by mouth daily.   loratadine 10 MG tablet Commonly known as: CLARITIN Take 1 tablet (10 mg total) by mouth daily.   LORazepam 1 MG tablet Commonly known as: ATIVAN Take 1 mg by mouth 2 (two) times daily.   metoprolol succinate 50 MG 24 hr tablet Commonly known  as: TOPROL-XL Take 1 tablet (50 mg total) by mouth daily. Take with or immediately following a meal. Start taking on: December 18, 2019 What changed:   medication strength  how much to take  additional instructions   NONFORMULARY OR COMPOUNDED ITEM Estradiol 0.02% vaginal cream prefilled applicators apply one twice weekly   ondansetron 4 MG tablet Commonly known as: ZOFRAN Take 4 mg by mouth every 8 (eight) hours as needed for nausea or vomiting.   OneTouch Delica Lancets 25D Misc   OneTouch Verio test strip Generic drug: glucose blood   oxyCODONE-acetaminophen 10-325 MG tablet Commonly known as: PERCOCET Take 1 tablet by mouth 2 (two) times daily as needed for pain.   pantoprazole 40 MG tablet Commonly known as: PROTONIX Take 1 tablet (40 mg total) by mouth 2 (two) times daily.   potassium chloride SA 20 MEQ tablet Commonly known as: KLOR-CON Take 2 tablets (40 mEq total) by mouth daily.   ProAir HFA 108 (90 Base) MCG/ACT inhaler Generic drug: albuterol Inhale 2 puffs into the lungs every 4 (four) hours as needed for wheezing or shortness of breath. Uses as rescue inhaler Reported on 06/20/2015 What changed: additional instructions   sacubitril-valsartan 49-51 MG Commonly known as: ENTRESTO Take 1 tablet by mouth 2 (two) times daily.  Replaces: sacubitril-valsartan 24-26 MG   ticagrelor 90 MG Tabs tablet Commonly known as: BRILINTA Take 1 tablet (90 mg total) by mouth 2 (two) times daily.   VITAMIN C PO Take 1 tablet by mouth daily.       Allergies  Allergen Reactions  . Fish-Derived Products Anaphylaxis  . Shellfish Allergy Shortness Of Breath    In dye for scan.  . Aspirin Nausea Only and Other (See Comments)    Pt stated it only gives her problems when she takes the high dose (325 mg)  . Ivp Dye [Iodinated Diagnostic Agents]     Hx of ? anaphlaxis  . Latex Other (See Comments)    Pt says she gets a "stinky infection"     Consultations:  cardiology   Procedures/Studies: DG Chest 2 View  Result Date: 12/11/2019 CLINICAL DATA:  Shortness of breath. EXAM: CHEST - 2 VIEW COMPARISON:  October 16, 2019. FINDINGS: Stable cardiomediastinal silhouette. No pneumothorax or pleural effusion is noted. Both lungs are clear. The visualized skeletal structures are unremarkable. IMPRESSION: No active cardiopulmonary disease. Electronically Signed   By: Marijo Conception M.D.   On: 12/11/2019 16:54   CT HEAD WO CONTRAST  Result Date: 12/14/2019 CLINICAL DATA:  Mental status change.  Unknown cause. EXAM: CT HEAD WITHOUT CONTRAST TECHNIQUE: Contiguous axial images were obtained from the base of the skull through the vertex without intravenous contrast. COMPARISON:  12/11/2019 FINDINGS: Brain: No evidence of acute infarction, hemorrhage, hydrocephalus, extra-axial collection or mass lesion/mass effect. Age related atrophy is noted. Vascular: No hyperdense vessel or unexpected calcification. Skull: Normal. Negative for fracture or focal lesion. Sinuses/Orbits: No acute finding. Other: None. IMPRESSION: 1. No acute intracranial abnormality. 2. Age related atrophy. Electronically Signed   By: Constance Holster M.D.   On: 12/14/2019 02:53   CT Head Wo Contrast  Result Date: 12/11/2019 CLINICAL DATA:  Nausea vomiting dizziness EXAM: CT HEAD WITHOUT CONTRAST TECHNIQUE: Contiguous axial images were obtained from the base of the skull through the vertex without intravenous contrast. COMPARISON:  October 21, 2019 FINDINGS: Brain: No evidence of acute territorial infarction, hemorrhage, hydrocephalus,extra-axial collection or mass lesion/mass effect. There is dilatation the ventricles and sulci consistent with age-related atrophy. Low-attenuation changes in the deep white matter consistent with small vessel ischemia. Vascular: No hyperdense vessel or unexpected calcification. Skull: The skull is intact. No fracture or focal lesion identified.  Sinuses/Orbits: The visualized paranasal sinuses and mastoid air cells are clear. The orbits and globes intact. Other: None IMPRESSION: No acute intracranial abnormality. Findings consistent with age related atrophy and chronic small vessel ischemia Electronically Signed   By: Prudencio Pair M.D.   On: 12/11/2019 21:49   MR BRAIN WO CONTRAST  Result Date: 12/12/2019 CLINICAL DATA:  [place] EXAM: MRI HEAD WITHOUT CONTRAST TECHNIQUE: Multiplanar, multiecho pulse sequences of the brain and surrounding structures were obtained without intravenous contrast. COMPARISON:  None. FINDINGS: Brain: No acute infarct, acute hemorrhage or extra-axial collection. Multifocal hyperintense T2-weighted signal within the white matter. Normal volume of CSF spaces. No chronic microhemorrhage. Normal midline structures. Vascular: Normal flow voids. Skull and upper cervical spine: Normal marrow signal. Sinuses/Orbits: Negative. Other: None. IMPRESSION: 1. No acute intracranial abnormality. 2. Multifocal hyperintense T2-weighted signal within the white matter, nonspecific but most commonly due to chronic small vessel ischemia. Electronically Signed   By: Ulyses Jarred M.D.   On: 12/12/2019 01:50   US Abdomen Complete  Result Date: 12/13/2019 CLINICAL DATA:  Vomiting EXAM: ABDOMEN ULTRASOUND COMPLETE COMPARISON:  None similar FINDINGS: Gallbladder: History of cholecystectomy Common bile duct: Diameter: 4 mm Liver: Echogenic liver without focal lesion portal vein is patent on color Doppler imaging with normal direction of blood flow towards the liver. IVC: No abnormality visualized. Pancreas: Visualized portion unremarkable. Spleen: Rounded echogenic area measuring 1.2 Cm in the lower spleen. No splenic enlargement. Right Kidney: Length: 11 cm. Echogenicity within normal limits. No mass or hydronephrosis visualized. Left Kidney: Length: 11.5 cm. Echogenicity within normal limits. No solid mass or hydronephrosis visualized. Simple  interpolar cyst measuring up to 2.7 cm. 1 cm echogenic area measured in the interpolar left kidney, hilar fat versus incidental angiomyolipoma. Abdominal aorta: No aneurysm visualized. IMPRESSION: 1. No acute finding or explanation for symptoms. 2. Hepatic steatosis. 3. 12 mm echogenic lesion in the spleen, usually benign and incidental in isolation-often hemangioma. Electronically Signed   By: Monte Fantasia M.D.   On: 12/13/2019 06:55   DG CHEST PORT 1 VIEW  Result Date: 12/13/2019 CLINICAL DATA:  Acute respiratory failure with hypoxia. EXAM: PORTABLE CHEST 1 VIEW COMPARISON:  12/12/2019 FINDINGS: 0529 hours. Stable asymmetric elevation right hemidiaphragm. The cardio pericardial silhouette is enlarged. Interstitial markings are diffusely coarsened with chronic features. No pulmonary edema or focal airspace consolidation. No pleural effusion. The visualized bony structures of the thorax show no acute abnormality. Telemetry leads overlie the chest. IMPRESSION: Stable.  No acute cardiopulmonary findings. Electronically Signed   By: Misty Stanley M.D.   On: 12/13/2019 07:50   DG CHEST PORT 1 VIEW  Result Date: 12/12/2019 CLINICAL DATA:  Tachypnea. EXAM: PORTABLE CHEST 1 VIEW COMPARISON:  Single-view of the chest 12/12/2019. PA and lateral chest 10/16/2019. FINDINGS: Mild left basilar atelectasis. Lungs otherwise clear. No pulmonary edema. Cardiomegaly. No pneumothorax or pleural effusion. No acute or focal bony abnormality. IMPRESSION: Cardiomegaly without acute disease. Electronically Signed   By: Inge Rise M.D.   On: 12/12/2019 14:43   DG CHEST PORT 1 VIEW  Result Date: 12/12/2019 CLINICAL DATA:  Dyspnea.  Prior coronary stenting. EXAM: PORTABLE CHEST 1 VIEW COMPARISON:  12/11/2019 FINDINGS: The cardiac silhouette remains mildly enlarged. Aortic atherosclerosis is noted. There is anterior eventration of the right hemidiaphragm. There are mild interstitial densities in the right greater than left  lung bases which are new or increased compared to the prior study, and there is mild central pulmonary vascular congestion. No sizable pleural effusion or pneumothorax is identified. IMPRESSION: Mild pulmonary vascular congestion and mild bibasilar interstitial densities, query early interstitial edema. Electronically Signed   By: Logan Bores M.D.   On: 12/12/2019 11:06   ECHOCARDIOGRAM LIMITED  Result Date: 12/13/2019    ECHOCARDIOGRAM LIMITED REPORT   Patient Name:   LOREDA SILVERIO Date of Exam: 12/13/2019 Medical Rec #:  [id number]       Height:       62.0 in Accession #:    [id number]      Weight:       154.5 lb Date of Birth:  February 05, 1946        BSA:          1.713 m Patient Age:    58 years        BP:           97/71 mmHg Patient Gender: F               HR:           93 bpm. Exam Location:  Inpatient Procedure: Limited Echo, Color Doppler, Cardiac Doppler  and Intracardiac            Opacification Agent Indications:    B01.75 Acute systolic (congestive) heart failure, NSTEMI  History:        Patient has prior history of Echocardiogram examinations, most                 recent 10/21/2019. CAD; Risk Factors:Diabetes, Dyslipidemia and                 COVID+ 04/21.  Sonographer:    Raquel Sarna Senior RDCS Referring Phys: [id number] KENNETH C HILTY IMPRESSIONS  1. EF similar to August diffuse hypokinesis worse in the septum , apex and inferior walls Basal lateral wall only area with preserved function . Left ventricular ejection fraction, by estimation, is 25 to 30%. The left ventricle has severely decreased function. The left ventricle demonstrates regional wall motion abnormalities (see scoring diagram/findings for description). The left ventricular internal cavity size was moderately dilated. Left ventricular diastolic parameters were normal.  2. Right ventricular systolic function was not well visualized. The right ventricular size is not well visualized. There is mildly elevated pulmonary artery systolic pressure.  3. The  mitral valve is normal in structure. Trivial mitral valve regurgitation. No evidence of mitral stenosis.  4. Tricuspid valve regurgitation is moderate.  5. The aortic valve is normal in structure. Aortic valve regurgitation is not visualized. Mild to moderate aortic valve sclerosis/calcification is present, without any evidence of aortic stenosis.  6. The inferior vena cava is normal in size with greater than 50% respiratory variability, suggesting right atrial pressure of 3 mmHg. FINDINGS  Left Ventricle: EF similar to August diffuse hypokinesis worse in the septum , apex and inferior walls Basal lateral wall only area with preserved function. Left ventricular ejection fraction, by estimation, is 25 to 30%. The left ventricle has severely  decreased function. The left ventricle demonstrates regional wall motion abnormalities. Definity contrast agent was given IV to delineate the left ventricular endocardial borders. The left ventricular internal cavity size was moderately dilated. There is no left ventricular hypertrophy. Left ventricular diastolic parameters were normal. Right Ventricle: The right ventricular size is not well visualized. Right vetricular wall thickness was not assessed. Right ventricular systolic function was not well visualized. There is mildly elevated pulmonary artery systolic pressure. The tricuspid regurgitant velocity is 2.90 m/s, and with an assumed right atrial pressure of 3 mmHg, the estimated right ventricular systolic pressure is 85.2 mmHg. Left Atrium: Left atrial size was normal in size. Right Atrium: Right atrial size was normal in size. Pericardium: There is no evidence of pericardial effusion. Mitral Valve: The mitral valve is normal in structure. Trivial mitral valve regurgitation. No evidence of mitral valve stenosis. Tricuspid Valve: The tricuspid valve is normal in structure. Tricuspid valve regurgitation is moderate . No evidence of tricuspid stenosis. Aortic Valve: The aortic  valve is normal in structure. Aortic valve regurgitation is not visualized. Mild to moderate aortic valve sclerosis/calcification is present, without any evidence of aortic stenosis. Pulmonic Valve: The pulmonic valve was normal in structure. Pulmonic valve regurgitation is mild. No evidence of pulmonic stenosis. Aorta: The aortic root is normal in size and structure. Venous: The inferior vena cava is normal in size with greater than 50% respiratory variability, suggesting right atrial pressure of 3 mmHg. IAS/Shunts: No atrial level shunt detected by color flow Doppler.  LV Volumes (MOD) LV vol d, MOD A4C: 111.0 ml LV vol s, MOD A4C: 86.1 ml LV SV MOD A4C:  111.0 ml RIGHT VENTRICLE RV S prime:     6.96 cm/s TAPSE (M-mode): 1.2 cm TRICUSPID VALVE TR Peak grad:   33.6 mmHg TR Vmax:        290.00 cm/s Jenkins Rouge MD Electronically signed by Jenkins Rouge MD Signature Date/Time: 12/13/2019/11:48:45 AM    Final    (Echo, Carotid, EGD, Colonoscopy, ERCP)    Subjective:  No new complaints feels great. Discharge Exam: Vitals:   12/17/19 0756 12/17/19 0944  BP:  (!) 145/80  Pulse: 93 87  Resp: (!) 22 19  Temp:    SpO2: 97% 98%   Vitals:   12/17/19 0449 12/17/19 0741 12/17/19 0756 12/17/19 0944  BP: 133/71 (!) 152/82  (!) 145/80  Pulse: 85 92 93 87  Resp: 20 20 (!) 22 19  Temp: 98.6 F (37 C) 99.7 F (37.6 C)    TempSrc: Oral Oral    SpO2: 92% 98% 97% 98%  Weight:      Height:        General: Pt is alert, awake, not in acute distress Cardiovascular: RRR, S1/S2 +, no rubs, no gallops Respiratory: CTA bilaterally, no wheezing, no rhonchi Abdominal: Soft, NT, ND, bowel sounds + Extremities: no edema, no cyanosis    The results of significant diagnostics from this hospitalization (including imaging, microbiology, ancillary and laboratory) are listed below for reference.     Microbiology: Recent Results (from the past 240 hour(s))  Respiratory Panel by RT PCR (Flu A&B, Covid) -  Nasopharyngeal Swab     Status: None   Collection Time: 12/11/19 10:54 PM   Specimen: Nasopharyngeal Swab  Result Value Ref Range Status   SARS Coronavirus 2 by RT PCR NEGATIVE NEGATIVE Final    Comment: (NOTE) SARS-CoV-2 target nucleic acids are NOT DETECTED.  The SARS-CoV-2 RNA is generally detectable in upper respiratoy specimens during the acute phase of infection. The lowest concentration of SARS-CoV-2 viral copies this assay can detect is 131 copies/mL. A negative result does not preclude SARS-Cov-2 infection and should not be used as the sole basis for treatment or other patient management decisions. A negative result may occur with  improper specimen collection/handling, submission of specimen other than nasopharyngeal swab, presence of viral mutation(s) within the areas targeted by this assay, and inadequate number of viral copies (<131 copies/mL). A negative result must be combined with clinical observations, patient history, and epidemiological information. The expected result is Negative.  Fact Sheet for Patients:  PinkCheek.be  Fact Sheet for Healthcare Providers:  GravelBags.it  This test is no t yet approved or cleared by the [place] FDA and  has been authorized for detection and/or diagnosis of SARS-CoV-2 by FDA under an Emergency Use Authorization (EUA). This EUA will remain  in effect (meaning this test can be used) for the duration of the COVID-19 declaration under Section 564(b)(1) of the Act, 21 U.S.C. section 360bbb-3(b)(1), unless the authorization is terminated or revoked sooner.     Influenza A by PCR NEGATIVE NEGATIVE Final   Influenza B by PCR NEGATIVE NEGATIVE Final    Comment: (NOTE) The Xpert Xpress SARS-CoV-2/FLU/RSV assay is intended as an aid in  the diagnosis of influenza from Nasopharyngeal swab specimens and  should not be used as a sole basis for treatment. Nasal washings and   aspirates are unacceptable for Xpert Xpress SARS-CoV-2/FLU/RSV  testing.  Fact Sheet for Patients: PinkCheek.be  Fact Sheet for Healthcare Providers: GravelBags.it  This test is not yet approved or cleared by the [place]  FDA and  has been authorized for detection and/or diagnosis of SARS-CoV-2 by  FDA under an Emergency Use Authorization (EUA). This EUA will remain  in effect (meaning this test can be used) for the duration of the  Covid-19 declaration under Section 564(b)(1) of the Act, 21  U.S.C. section 360bbb-3(b)(1), unless the authorization is  terminated or revoked. Performed at [facility], [place] [address], [place], [place] [address]   Culture, blood (routine x 2)     Status: None   Collection Time: 12/12/19  3:06 PM   Specimen: BLOOD  Result Value Ref Range Status   Specimen Description BLOOD LEFT ANTECUBITAL  Final   Special Requests   Final    BOTTLES DRAWN AEROBIC AND ANAEROBIC Blood Culture results may not be optimal due to an inadequate volume of blood received in culture bottles   Culture   Final    NO GROWTH 5 DAYS Performed at [facility], [place] [address] [place], [place] [address]    Report Status 12/17/2019 FINAL  Final  Culture, blood (routine x 2)     Status: None   Collection Time: 12/12/19  3:12 PM   Specimen: BLOOD RIGHT HAND  Result Value Ref Range Status   Specimen Description BLOOD RIGHT HAND  Final   Special Requests   Final    BOTTLES DRAWN AEROBIC ONLY Blood Culture results may not be optimal due to an inadequate volume of blood received in culture bottles   Culture   Final    NO GROWTH 5 DAYS Performed at [facility], [place] [address] [place], [place] [address]    Report Status 12/17/2019 FINAL  Final  MRSA PCR Screening     Status: None   Collection Time: 12/13/19  6:20 AM   Specimen: Nasopharyngeal  Result Value Ref Range Status   MRSA by PCR NEGATIVE  NEGATIVE Final    Comment:        The GeneXpert MRSA Assay (FDA approved for NASAL specimens only), is one component of a comprehensive MRSA colonization surveillance program. It is not intended to diagnose MRSA infection nor to guide or monitor treatment for MRSA infections. Performed at [facility], [place] [address], [place],  [address]   Respiratory Panel by PCR     Status: None   Collection Time: 12/15/19  9:21 AM   Specimen: Nasopharyngeal Swab; Respiratory  Result Value Ref Range Status   Adenovirus NOT DETECTED NOT DETECTED Final   Coronavirus 229E NOT DETECTED NOT DETECTED Final    Comment: (NOTE) The Coronavirus on the Respiratory Panel, DOES NOT test for the novel  Coronavirus (2019 nCoV)    Coronavirus HKU1 NOT DETECTED NOT DETECTED Final   Coronavirus NL63 NOT DETECTED NOT DETECTED Final   Coronavirus OC43 NOT DETECTED NOT DETECTED Final   Metapneumovirus NOT DETECTED NOT DETECTED Final   Rhinovirus / Enterovirus NOT DETECTED NOT DETECTED Final   Influenza A NOT DETECTED NOT DETECTED Final   Influenza B NOT DETECTED NOT DETECTED Final   Parainfluenza Virus 1 NOT DETECTED NOT DETECTED Final   Parainfluenza Virus 2 NOT DETECTED NOT DETECTED Final   Parainfluenza Virus 3 NOT DETECTED NOT DETECTED Final   Parainfluenza Virus 4 NOT DETECTED NOT DETECTED Final   Respiratory Syncytial Virus NOT DETECTED NOT DETECTED Final   Bordetella pertussis NOT DETECTED NOT DETECTED Final   Chlamydophila pneumoniae NOT DETECTED NOT DETECTED Final   Mycoplasma pneumoniae NOT DETECTED NOT DETECTED Final  Comment: Performed at [facility], [place] [address], [place], [place] [address]     Labs: BNP (last 3 results) Recent Labs    10/16/19 1615  BNP 629.5*   Basic Metabolic Panel: Recent Labs  Lab 12/12/19 0737 12/12/19 0737 12/12/19 0902 12/12/19 1035 12/12/19 1035 12/12/19 1507 12/13/19 0125 12/13/19 1019 12/14/19 0125 12/14/19 1549 12/15/19 0601   NA 141   < >  --  139   < > 136 142  --  139 141 139  K 3.0*   < >  --  3.0*   < > 3.5 4.5  --  5.0 4.1 5.0  CL 106   < >  --  103   < > 103 112*  --  108 107 108  CO2 19*   < >  --  21*   < > 21* 22  --  17* 25 24  GLUCOSE 166*   < >  --  192*   < > 349* 193*  --  149* 139* 129*  BUN 7*   < >  --  8   < > 7* 8  --  10 10 7*  CREATININE 0.90   < >  --  1.00   < > 0.96 0.95  --  0.88 0.95 0.80  CALCIUM 6.7*   < >  --  6.7*   < > 6.3* 7.6*  --  7.6* 8.8* 9.3  MG 0.2*   < > 0.2* 0.9*  --  3.8*  --  2.1  --  2.0  --   PHOS 4.1  --   --  3.9  --   --   --   --   --   --   --    < > = values in this interval not displayed.   Liver Function Tests: Recent Labs  Lab 12/11/19 2117 12/12/19 1035 12/12/19 1507 12/13/19 0125  AST 32 56* 60* 64*  ALT 21 26 26 30   ALKPHOS 63 53 52 48  BILITOT 1.3* 1.1 1.0 1.1  PROT 7.2 6.1* 5.9* 6.0*  ALBUMIN 3.6 3.1* 2.9* 2.9*   No results for input(s): LIPASE, AMYLASE in the last 168 hours. Recent Labs  Lab 12/11/19 2117 12/12/19 0736  AMMONIA 55* 52*   CBC: Recent Labs  Lab 12/11/19 1622 12/12/19 0736 12/12/19 1035 12/13/19 0125 12/14/19 0640  WBC 20.3* 17.6* 18.9* 13.8* 9.3  NEUTROABS  --  14.7*  --  12.5* 7.7  HGB 13.3 11.4* 12.0 10.9* 10.0*  HCT 39.4 34.1* 35.3* 32.5* 30.5*  MCV 88.7 90.0 90.7 92.6 95.3  PLT 331 254 287 185 182   Cardiac Enzymes: No results for input(s): CKTOTAL, CKMB, CKMBINDEX, TROPONINI in the last 168 hours. BNP: Invalid input(s): POCBNP CBG: Recent Labs  Lab 12/16/19 1108 12/16/19 1557 12/16/19 2208 12/17/19 0444 12/17/19 0618  GLUCAP 104* 137* 170* 109* 100*   D-Dimer No results for input(s): DDIMER in the last 72 hours. Hgb A1c No results for input(s): HGBA1C in the last 72 hours. Lipid Profile Recent Labs    12/16/19 0237  CHOL 68  HDL 22*  LDLCALC 21  TRIG 123  CHOLHDL 3.1   Thyroid function studies No results for input(s): TSH, T4TOTAL, T3FREE, THYROIDAB in the last 72 hours.  Invalid  input(s): FREET3 Anemia work up No results for input(s): VITAMINB12, FOLATE, FERRITIN, TIBC, IRON, RETICCTPCT in the last 72 hours. Urinalysis    Component Value Date/Time   COLORURINE YELLOW  12/13/2019 [place] 12/13/2019 0639   LABSPEC 1.014 12/13/2019 0639   PHURINE 5.0 12/13/2019 0639   GLUCOSEU 50 (A) 12/13/2019 0639   HGBUR SMALL (A) 12/13/2019 0639   BILIRUBINUR NEGATIVE 12/13/2019 [place] 12/13/2019 0639   PROTEINUR NEGATIVE 12/13/2019 0639   UROBILINOGEN 0.2 02/23/2014 1528   NITRITE NEGATIVE 12/13/2019 0639   LEUKOCYTESUR TRACE (A) 12/13/2019 0639   Sepsis Labs Invalid input(s): PROCALCITONIN,  WBC,  LACTICIDVEN Microbiology Recent Results (from the past 240 hour(s))  Respiratory Panel by RT PCR (Flu A&B, Covid) - Nasopharyngeal Swab     Status: None   Collection Time: 12/11/19 10:54 PM   Specimen: Nasopharyngeal Swab  Result Value Ref Range Status   SARS Coronavirus 2 by RT PCR NEGATIVE NEGATIVE Final    Comment: (NOTE) SARS-CoV-2 target nucleic acids are NOT DETECTED.  The SARS-CoV-2 RNA is generally detectable in upper respiratoy specimens during the acute phase of infection. The lowest concentration of SARS-CoV-2 viral copies this assay can detect is 131 copies/mL. A negative result does not preclude SARS-Cov-2 infection and should not be used as the sole basis for treatment or other patient management decisions. A negative result may occur with  improper specimen collection/handling, submission of specimen other than nasopharyngeal swab, presence of viral mutation(s) within the areas targeted by this assay, and inadequate number of viral copies (<131 copies/mL). A negative result must be combined with clinical observations, patient history, and epidemiological information. The expected result is Negative.  Fact Sheet for Patients:  PinkCheek.be  Fact Sheet for Healthcare Providers:   GravelBags.it  This test is no t yet approved or cleared by the [place] FDA and  has been authorized for detection and/or diagnosis of SARS-CoV-2 by FDA under an Emergency Use Authorization (EUA). This EUA will remain  in effect (meaning this test can be used) for the duration of the COVID-19 declaration under Section 564(b)(1) of the Act, 21 U.S.C. section 360bbb-3(b)(1), unless the authorization is terminated or revoked sooner.     Influenza A by PCR NEGATIVE NEGATIVE Final   Influenza B by PCR NEGATIVE NEGATIVE Final    Comment: (NOTE) The Xpert Xpress SARS-CoV-2/FLU/RSV assay is intended as an aid in  the diagnosis of influenza from Nasopharyngeal swab specimens and  should not be used as a sole basis for treatment. Nasal washings and  aspirates are unacceptable for Xpert Xpress SARS-CoV-2/FLU/RSV  testing.  Fact Sheet for Patients: PinkCheek.be  Fact Sheet for Healthcare Providers: GravelBags.it  This test is not yet approved or cleared by the [place] FDA and  has been authorized for detection and/or diagnosis of SARS-CoV-2 by  FDA under an Emergency Use Authorization (EUA). This EUA will remain  in effect (meaning this test can be used) for the duration of the  Covid-19 declaration under Section 564(b)(1) of the Act, 21  U.S.C. section 360bbb-3(b)(1), unless the authorization is  terminated or revoked. Performed at [facility], [place] [address], [place], [place] [address]   Culture, blood (routine x 2)     Status: None   Collection Time: 12/12/19  3:06 PM   Specimen: BLOOD  Result Value Ref Range Status   Specimen Description BLOOD LEFT ANTECUBITAL  Final   Special Requests   Final    BOTTLES DRAWN AEROBIC AND ANAEROBIC Blood Culture results may not be optimal due to an inadequate volume of blood received in culture bottles   Culture   Final    NO  GROWTH 5  DAYS Performed at [facility], [place] [address] [place], [place] [address]    Report Status 12/17/2019 FINAL  Final  Culture, blood (routine x 2)     Status: None   Collection Time: 12/12/19  3:12 PM   Specimen: BLOOD RIGHT HAND  Result Value Ref Range Status   Specimen Description BLOOD RIGHT HAND  Final   Special Requests   Final    BOTTLES DRAWN AEROBIC ONLY Blood Culture results may not be optimal due to an inadequate volume of blood received in culture bottles   Culture   Final    NO GROWTH 5 DAYS Performed at [facility], [place] [address], [place], [place] [address]    Report Status 12/17/2019 FINAL  Final  MRSA PCR Screening     Status: None   Collection Time: 12/13/19  6:20 AM   Specimen: Nasopharyngeal  Result Value Ref Range Status   MRSA by PCR NEGATIVE NEGATIVE Final    Comment:        The GeneXpert MRSA Assay (FDA approved for NASAL specimens only), is one component of a comprehensive MRSA colonization surveillance program. It is not intended to diagnose MRSA infection nor to guide or monitor treatment for MRSA infections. Performed at [facility], [place] [address], [place], [place] [address]   Respiratory Panel by PCR     Status: None   Collection Time: 12/15/19  9:21 AM   Specimen: Nasopharyngeal Swab; Respiratory  Result Value Ref Range Status   Adenovirus NOT DETECTED NOT DETECTED Final   Coronavirus 229E NOT DETECTED NOT DETECTED Final    Comment: (NOTE) The Coronavirus on the Respiratory Panel, DOES NOT test for the novel  Coronavirus (2019 nCoV)    Coronavirus HKU1 NOT DETECTED NOT DETECTED Final   Coronavirus NL63 NOT DETECTED NOT DETECTED Final   Coronavirus OC43 NOT DETECTED NOT DETECTED Final   Metapneumovirus NOT DETECTED NOT DETECTED Final   Rhinovirus / Enterovirus NOT DETECTED NOT DETECTED Final   Influenza A NOT DETECTED NOT DETECTED Final   Influenza B NOT DETECTED NOT DETECTED Final   Parainfluenza Virus 1 NOT DETECTED  NOT DETECTED Final   Parainfluenza Virus 2 NOT DETECTED NOT DETECTED Final   Parainfluenza Virus 3 NOT DETECTED NOT DETECTED Final   Parainfluenza Virus 4 NOT DETECTED NOT DETECTED Final   Respiratory Syncytial Virus NOT DETECTED NOT DETECTED Final   Bordetella pertussis NOT DETECTED NOT DETECTED Final   Chlamydophila pneumoniae NOT DETECTED NOT DETECTED Final   Mycoplasma pneumoniae NOT DETECTED NOT DETECTED Final    Comment: Performed at [facility], [place]. [address], [place], [place] [address]     Time coordinating discharge: Over 30 minutes  SIGNED:   Charlynne Cousins, MD  [facility] 12/17/2019, 10:20 AM Pager   If 7PM-7AM, please contact night-coverage [email or web address] Password [email or web address]

## 2019-12-17 NOTE — TOC Progression Note (Signed)
Transition of Care [facility]) - Progression Note    Patient Details  Name: Nicole [name] MRN: [id number] Date of Birth: 12/24/1945  Transition of Care [facility]) CM/SW [place], [place] Phone Number: 12/17/2019, 2:27 PM  Clinical Narrative:     CSW sent message to [facility] per patient/family request. Waiting on response  Sent  Message to [facility] to confirm availability - waiting on response.  Thurmond Butts, MSW, LCSWA Clinical Social Worker     Barriers to Discharge: Continued Medical Work up, SNF Pending bed offer, Ship broker, Programmer, applications ([facility])  Expected Discharge Plan and Services   In-house Referral: Clinical Social Work     Living arrangements for the past 2 months: [place] Expected Discharge Date: 12/17/19                                     Social Determinants of Health (SDOH) Interventions    Readmission Risk Interventions Readmission Risk Prevention Plan 10/22/2019  Transportation Screening (No Data)  PCP or Specialist Appt within 3-5 Days Complete  HRI or [place] Complete  Social Work Consult for [place] Planning/Counseling Complete  Palliative Care Screening Not Applicable  Medication Review Press photographer) Complete  Some recent data might be hidden

## 2019-12-18 DIAGNOSIS — A419 Sepsis, unspecified organism: Secondary | ICD-10-CM | POA: Diagnosis not present

## 2019-12-18 DIAGNOSIS — R6520 Severe sepsis without septic shock: Secondary | ICD-10-CM | POA: Diagnosis not present

## 2019-12-18 LAB — RESPIRATORY PANEL BY RT PCR (FLU A&B, COVID)
Influenza A by PCR: NEGATIVE
Influenza B by PCR: NEGATIVE
SARS Coronavirus 2 by RT PCR: NEGATIVE

## 2019-12-18 LAB — GLUCOSE, CAPILLARY
Glucose-Capillary: 140 mg/dL — ABNORMAL HIGH (ref 70–99)
Glucose-Capillary: 110 mg/dL — ABNORMAL HIGH (ref 70–99)
Glucose-Capillary: 143 mg/dL — ABNORMAL HIGH (ref 70–99)

## 2019-12-18 LAB — BASIC METABOLIC PANEL
Anion gap: 9 (ref 5–15)
BUN: <5 mg/dL — ABNORMAL LOW (ref 8–23)
CO2: 27 mmol/L (ref 22–32)
Calcium: 9 mg/dL (ref 8.9–10.3)
Chloride: 104 mmol/L (ref 98–111)
Creatinine, Ser: 0.68 mg/dL (ref 0.44–1.00)
GFR, Estimated: >60 mL/min (ref >60)
Glucose, Bld: 156 mg/dL — ABNORMAL HIGH (ref 70–99)
Potassium: 3.6 mmol/L (ref 3.5–5.1)
Sodium: 140 mmol/L (ref 135–145)

## 2019-12-18 MED ORDER — OXYCODONE-ACETAMINOPHEN 10-325 MG PO TABS
1.0000 | ORAL_TABLET | Freq: Two times a day (BID) | ORAL | 0 refills | Status: DC | PRN
Start: 2019-12-18 — End: 2023-12-22

## 2019-12-18 MED ORDER — LORAZEPAM 1 MG PO TABS
1.0000 mg | ORAL_TABLET | Freq: Two times a day (BID) | ORAL | 0 refills | Status: DC
Start: 2019-12-18 — End: 2023-12-22

## 2019-12-18 NOTE — TOC Transition Note (Signed)
Transition of Care [facility]) - CM/SW Discharge Note   Patient Details  Name: Nicole [name] MRN: [id number] Date of Birth: 05-20-1945  Transition of Care [facility]) CM/SW Contact:  Vinie Sill, [place] Phone Number: 12/18/2019, 5:33 PM   Clinical Narrative:     Patient will DC to: [place]  DC Date: 12/18/2019 Family Notified: daughter, in the room with patient  Transport [email or web address]   Per MD patient is ready for discharge. RN, patient, and facility notified of DC. Discharge Summary sent to facility. RN given number for [phone]. Ambulance transport requested for patient.   Clinical Social Worker signing off.  Thurmond Butts, MSW, LCSWA Clinical Social Worker     Barriers to Discharge:  (confirmation from the [facility])   Patient Goals and CMS Choice        Discharge Placement                       Discharge Plan and Services In-house Referral: Clinical Social Work                                   Social Determinants of Health (SDOH) Interventions     Readmission Risk Interventions Readmission Risk Prevention Plan 10/22/2019  Transportation Screening (No Data)  PCP or Specialist Appt within 3-5 Days Complete  HRI or [place] Complete  Social Work Consult for [place] Planning/Counseling Complete  Palliative Care Screening Not Applicable  Medication Review Press photographer) Complete  Some recent data might be hidden

## 2019-12-18 NOTE — Progress Notes (Signed)
[facility] PROGRESS NOTE    Progress Note  Nicole [name]  NTZ:[id number] DOB: Mar 25, 1945 DOA: 12/11/2019 PCP: [name], MD     Brief Narrative:   Nicole [name] is an 74 y.o. female past medical history significant of normocytic anemia, anxiety depression memory difficulties, migraine headaches, diabetes mellitus type 2 urinary incontinence, CAD on dual antiplatelet therapy for a stent placed in August presents to the emergency room with nausea vomiting diarrhea and dizziness.  She had a white count of 20, hemoglobin of 13 troponin is of 218, mildly elevated LFTs with a bilirubin of 1.3, there was a concern of sepsis she was started on IV Rocephin and Flagyl, culture data did remain negative till date, respiratory panel was negative.  On 12/12/2019 she was found to be tachypneic with a heart rate of 120 requiring 2 L of oxygen.  [place] biomarkers were mildly elevated EKG demonstrated lateral ST segment depression.  Cardiology was consulted she was started on IV heparin and CHF medication.  Echocardiogram was done that showed hypokinesia of the septum with an EF of 25%.  She then developed a fever 103 blood cultures were ordered to start empirically on IV vancomycin and cefepime on 12/12/2019 on 12/13/2018  her mentation had cleared. Assessment/Plan:   Severe sepsis [facility]) She is complete her course of IV empiric antibiotics in house. Respiratory panel has been negative she has remained afebrile follow-up with ID in 2 to 4 weeks Physical therapy has evaluated the patient recommended skilled nursing facility. Patient is medically stable for transfer and awaiting disposition to skilled nursing facility, I think there is a difficulty with the PASSR.  High anion gap metabolic acidosis: Resolved likely due to sepsis    Electrolytes abnormalities: Try to keep potassium greater than 4 magnesium greater than 2.  NSTEMI CAD with a stent placement on 8//2021, currently on  DAPT: Echocardiogram was done that showed diffuse worsening hypokinesia with an EF of 25%. Cards was consulted related elevation in troponins with marked demand ischemia in the setting of severe sepsis.  Ischemic cardiomyopathy/acute on chronic systolic heart failure: Cardiology recommended to continue current management. Be judicious with IV fluids.  Vomiting and abdominal pain: Patient refused CT scan of the abdomen and pelvis with contrast as well as prophylaxis due to her IV contrast allergy.  Acute confusional state: In the setting of sepsis or possibly benzodiazepine withdrawal. Discontinue CIWA protocol change her to her home dose regimen of benzodiazepines Ativan 1 mg twice a day.  Anxiety/Depression Resume Cymbalta.  GERD with esophagitis Continue PPI.  Mixed diabetic hyperlipidemia associated with type 2 diabetes mellitus (HCC) Controlled ranging from 73-1 29, currently on no insulin.   DVT prophylaxis: lovenox Family Communication:none Status is: Inpatient  Remains inpatient appropriate because:Hemodynamically unstable   Dispo: The patient is from: Home              Anticipated d/c is to: SNF              Anticipated d/c date is: 1days              Patient currently is medically stable to d/c.        Code Status:     Code Status Orders  (From admission, onward)         Start     Ordered   12/12/19 0200  Full code  Continuous        12/12/19 0201        Code Status History  Date Active Date Inactive Code Status Order ID Comments User Context   10/16/2019 2329 10/22/2019 1755 Full Code [id number]  Neena Rhymes, MD ED   06/24/2019 2046 06/28/2019 2121 Full Code [id number]  Shalhoub, Sherryll Burger, MD ED   Advance Care Planning Activity        IV Access:    Peripheral IV   Procedures and diagnostic studies:   No results found.   Medical Consultants:    None.  Anti-Infectives:   IV Rocephin.  Subjective:    Nicole [name] no  complaints this morning.  Objective:    Vitals:   12/18/19 0300 12/18/19 0628 12/18/19 0741 12/18/19 0824  BP: (!) 169/83  136/74   Pulse: 95  92   Resp: (!) 25  19   Temp: 99.8 F (37.7 C)  98.7 F (37.1 C)   TempSrc: Oral  Oral   SpO2: 98%  100% 100%  Weight:  71.7 kg    Height:       SpO2: 100 % O2 Flow Rate (L/min): 3 L/min FiO2 (%): 21 %  No intake or output data in the 24 hours ending 12/18/19 0917 Filed Weights   12/11/19 2037 12/18/19 0628  Weight: 70.1 kg 71.7 kg    Exam: General exam: In no acute distress. Respiratory system: Good air movement and clear to auscultation. Cardiovascular system: S1 & S2 heard, RRR. No JVD. Gastrointestinal system: Abdomen is nondistended, soft and nontender.  Extremities: No pedal edema. Skin: No rashes, lesions or ulcers  Data Reviewed:    Labs: Basic Metabolic Panel: Recent Labs  Lab 12/12/19 0737 12/12/19 0737 12/12/19 0902 12/12/19 1035 12/12/19 1035 12/12/19 1507 12/12/19 1507 12/13/19 0125 12/13/19 0125 12/13/19 1019 12/14/19 0125 12/14/19 0125 12/14/19 1549 12/14/19 1549 12/15/19 0601 12/18/19 0126  NA 141   < >  --  139   < > 136   < > 142  --   --  139  --  141  --  139 140  K 3.0*   < >  --  3.0*   < > 3.5   < > 4.5   < >  --  5.0   < > 4.1   < > 5.0 3.6  CL 106   < >  --  103   < > 103   < > 112*  --   --  108  --  107  --  108 104  CO2 19*   < >  --  21*   < > 21*   < > 22  --   --  17*  --  25  --  24 27  GLUCOSE 166*   < >  --  192*   < > 349*   < > 193*  --   --  149*  --  139*  --  129* 156*  BUN 7*   < >  --  8   < > 7*   < > 8  --   --  10  --  10  --  7* <5*  CREATININE 0.90   < >  --  1.00   < > 0.96   < > 0.95  --   --  0.88  --  0.95  --  0.80 0.68  CALCIUM 6.7*   < >  --  6.7*   < > 6.3*   < > 7.6*  --   --  7.6*  --  8.8*  --  9.3 9.0  MG 0.2*   < > 0.2* 0.9*  --  3.8*  --   --   --  2.1  --   --  2.0  --   --   --   PHOS 4.1  --   --  3.9  --   --   --   --   --   --   --   --   --    --   --   --    < > = values in this interval not displayed.   GFR Estimated Creatinine Clearance: 58.9 mL/min (by C-G formula based on SCr of 0.68 mg/dL). Liver Function Tests: Recent Labs  Lab 12/11/19 2117 12/12/19 1035 12/12/19 1507 12/13/19 0125  AST 32 56* 60* 64*  ALT 21 26 26 30   ALKPHOS 63 53 52 48  BILITOT 1.3* 1.1 1.0 1.1  PROT 7.2 6.1* 5.9* 6.0*  ALBUMIN 3.6 3.1* 2.9* 2.9*   No results for input(s): LIPASE, AMYLASE in the last 168 hours. Recent Labs  Lab 12/11/19 2117 12/12/19 0736  AMMONIA 55* 52*   Coagulation profile Recent Labs  Lab 12/12/19 0327  INR 1.1   COVID-19 Labs  No results for input(s): DDIMER, FERRITIN, LDH, CRP in the last 72 hours.  Lab Results  Component Value Date   SARSCOV2NAA NEGATIVE 12/11/2019   [place] NEGATIVE 10/16/2019    CBC: Recent Labs  Lab 12/11/19 1622 12/12/19 0736 12/12/19 1035 12/13/19 0125 12/14/19 0640  WBC 20.3* 17.6* 18.9* 13.8* 9.3  NEUTROABS  --  14.7*  --  12.5* 7.7  HGB 13.3 11.4* 12.0 10.9* 10.0*  HCT 39.4 34.1* 35.3* 32.5* 30.5*  MCV 88.7 90.0 90.7 92.6 95.3  PLT 331 254 287 185 182   Cardiac Enzymes: No results for input(s): CKTOTAL, CKMB, CKMBINDEX, TROPONINI in the last 168 hours. BNP (last 3 results) No results for input(s): PROBNP in the last 8760 hours. CBG: Recent Labs  Lab 12/17/19 0618 12/17/19 1239 12/17/19 1800 12/17/19 2124 12/18/19 0618  GLUCAP 100* 129* 131* 136* 140*   D-Dimer: No results for input(s): DDIMER in the last 72 hours. Hgb A1c: No results for input(s): HGBA1C in the last 72 hours. Lipid Profile: Recent Labs    12/16/19 0237  CHOL 68  HDL 22*  LDLCALC 21  TRIG 123  CHOLHDL 3.1   Thyroid function studies: No results for input(s): TSH, T4TOTAL, T3FREE, THYROIDAB in the last 72 hours.  Invalid input(s): FREET3 Anemia work up: No results for input(s): VITAMINB12, FOLATE, FERRITIN, TIBC, IRON, RETICCTPCT in the last 72 hours. Sepsis Labs: Recent  Labs  Lab 12/11/19 1622 12/11/19 2117 12/12/19 0327 12/12/19 0524 12/12/19 0736 12/12/19 1035 12/13/19 0125 12/13/19 2219 12/14/19 0125 12/14/19 0640  PROCALCITON  --   --  <0.10  --   --   --  0.94  --  0.79  --   WBC   < >  --   --   --  17.6* 18.9* 13.8*  --   --  9.3  LATICACIDVEN  --    < >  --  1.8  --   --   --  1.9 4.6* 2.3*   < > = values in this interval not displayed.   Microbiology Recent Results (from the past 240 hour(s))  Respiratory Panel by RT PCR (Flu A&B, Covid) - Nasopharyngeal Swab     Status: None   Collection Time: 12/11/19 10:54 PM  Specimen: Nasopharyngeal Swab  Result Value Ref Range Status   SARS Coronavirus 2 by RT PCR NEGATIVE NEGATIVE Final    Comment: (NOTE) SARS-CoV-2 target nucleic acids are NOT DETECTED.  The SARS-CoV-2 RNA is generally detectable in upper respiratoy specimens during the acute phase of infection. The lowest concentration of SARS-CoV-2 viral copies this assay can detect is 131 copies/mL. A negative result does not preclude SARS-Cov-2 infection and should not be used as the sole basis for treatment or other patient management decisions. A negative result may occur with  improper specimen collection/handling, submission of specimen other than nasopharyngeal swab, presence of viral mutation(s) within the areas targeted by this assay, and inadequate number of viral copies (<131 copies/mL). A negative result must be combined with clinical observations, patient history, and epidemiological information. The expected result is Negative.  Fact Sheet for Patients:  PinkCheek.be  Fact Sheet for Healthcare Providers:  GravelBags.it  This test is no t yet approved or cleared by the [place] FDA and  has been authorized for detection and/or diagnosis of SARS-CoV-2 by FDA under an Emergency Use Authorization (EUA). This EUA will remain  in effect (meaning this test can be  used) for the duration of the COVID-19 declaration under Section 564(b)(1) of the Act, 21 U.S.C. section 360bbb-3(b)(1), unless the authorization is terminated or revoked sooner.     Influenza A by PCR NEGATIVE NEGATIVE Final   Influenza B by PCR NEGATIVE NEGATIVE Final    Comment: (NOTE) The Xpert Xpress SARS-CoV-2/FLU/RSV assay is intended as an aid in  the diagnosis of influenza from Nasopharyngeal swab specimens and  should not be used as a sole basis for treatment. Nasal washings and  aspirates are unacceptable for Xpert Xpress SARS-CoV-2/FLU/RSV  testing.  Fact Sheet for Patients: PinkCheek.be  Fact Sheet for Healthcare Providers: GravelBags.it  This test is not yet approved or cleared by the [place] FDA and  has been authorized for detection and/or diagnosis of SARS-CoV-2 by  FDA under an Emergency Use Authorization (EUA). This EUA will remain  in effect (meaning this test can be used) for the duration of the  Covid-19 declaration under Section 564(b)(1) of the Act, 21  U.S.C. section 360bbb-3(b)(1), unless the authorization is  terminated or revoked. Performed at [facility], [place] [address], [place], [place] [address]   Culture, blood (routine x 2)     Status: None   Collection Time: 12/12/19  3:06 PM   Specimen: BLOOD  Result Value Ref Range Status   Specimen Description BLOOD LEFT ANTECUBITAL  Final   Special Requests   Final    BOTTLES DRAWN AEROBIC AND ANAEROBIC Blood Culture results may not be optimal due to an inadequate volume of blood received in culture bottles   Culture   Final    NO GROWTH 5 DAYS Performed at [facility], [place] [address], [place], [place] [address]    Report Status 12/17/2019 FINAL  Final  Culture, blood (routine x 2)     Status: None   Collection Time: 12/12/19  3:12 PM   Specimen: BLOOD RIGHT HAND  Result Value Ref Range Status   Specimen Description BLOOD  RIGHT HAND  Final   Special Requests   Final    BOTTLES DRAWN AEROBIC ONLY Blood Culture results may not be optimal due to an inadequate volume of blood received in culture bottles   Culture   Final    NO GROWTH 5 DAYS Performed at [facility]  Lab, [address] [place], [place] [address]    Report Status 12/17/2019 FINAL  Final  MRSA PCR Screening     Status: None   Collection Time: 12/13/19  6:20 AM   Specimen: Nasopharyngeal  Result Value Ref Range Status   MRSA by PCR NEGATIVE NEGATIVE Final    Comment:        The GeneXpert MRSA Assay (FDA approved for NASAL specimens only), is one component of a comprehensive MRSA colonization surveillance program. It is not intended to diagnose MRSA infection nor to guide or monitor treatment for MRSA infections. Performed at [facility], [place] [address], [place], [place] [address]   Respiratory Panel by PCR     Status: None   Collection Time: 12/15/19  9:21 AM   Specimen: Nasopharyngeal Swab; Respiratory  Result Value Ref Range Status   Adenovirus NOT DETECTED NOT DETECTED Final   Coronavirus 229E NOT DETECTED NOT DETECTED Final    Comment: (NOTE) The Coronavirus on the Respiratory Panel, DOES NOT test for the novel  Coronavirus (2019 nCoV)    Coronavirus HKU1 NOT DETECTED NOT DETECTED Final   Coronavirus NL63 NOT DETECTED NOT DETECTED Final   Coronavirus OC43 NOT DETECTED NOT DETECTED Final   Metapneumovirus NOT DETECTED NOT DETECTED Final   Rhinovirus / Enterovirus NOT DETECTED NOT DETECTED Final   Influenza A NOT DETECTED NOT DETECTED Final   Influenza B NOT DETECTED NOT DETECTED Final   Parainfluenza Virus 1 NOT DETECTED NOT DETECTED Final   Parainfluenza Virus 2 NOT DETECTED NOT DETECTED Final   Parainfluenza Virus 3 NOT DETECTED NOT DETECTED Final   Parainfluenza Virus 4 NOT DETECTED NOT DETECTED Final   Respiratory Syncytial Virus NOT DETECTED NOT DETECTED Final   Bordetella pertussis NOT DETECTED NOT DETECTED  Final   Chlamydophila pneumoniae NOT DETECTED NOT DETECTED Final   Mycoplasma pneumoniae NOT DETECTED NOT DETECTED Final    Comment: Performed at [facility], [place], [place], [place] [address]     Medications:   . aspirin  81 mg Oral Daily  . atorvastatin  80 mg Oral QHS  . DULoxetine  60 mg Oral Daily  . enoxaparin (LOVENOX) injection  40 mg Subcutaneous Q24H  . folic acid  1 mg Oral Daily  . furosemide  40 mg Oral Daily  . LORazepam  1 mg Oral BID  . metoprolol succinate  50 mg Oral Daily  . mometasone-formoterol  2 puff Inhalation BID  . multivitamin with minerals  1 tablet Oral Daily  . pantoprazole (PROTONIX) IV  40 mg Intravenous Q24H  . sacubitril-valsartan  1 tablet Oral BID  . thiamine  100 mg Oral Daily   Or  . thiamine  100 mg Intravenous Daily  . ticagrelor  90 mg Oral BID  . Vitamin D (Ergocalciferol)  50,000 Units Oral Q Sun   Continuous Infusions:     LOS: 6 days   Charlynne Cousins  [facility]  12/18/2019, 9:17 AM

## 2019-12-18 NOTE — TOC Progression Note (Signed)
Transition of Care [facility]) - Progression Note    Patient Details  Name: Nicole [name] MRN: [id number] Date of Birth: 1946/02/16  Transition of Care [facility]) CM/SW [place], [place] Phone Number: 12/18/2019, 4:40 PM  Clinical Narrative:     CSW unable to reach admission coordinator for [facility] to confirm, the patient is ready for discharge.  Message sent to admissions-9:31a,11:03a, 2:07p and 4:34p Called-9:30a,9:38a  missed called from SNF 10:04a,  10:07a- confirmed bed availability-CSW advised will start insurance auth-request covid- she is medially stable for discharge, willkeep them posted 10:26-received authorization-  4:55pCalled Lead Administrative - left voice message 4:53p-Called [facility]- was placed on long hold  5:10pReceived call back- received approval to send patient - d/c summary sent  Thurmond Butts, MSW, LCSWA Clinical Social Worker    Barriers to Discharge: Continued Medical Work up, SNF Pending bed offer, Ship broker, Programmer, applications ([place])  Expected Discharge Plan and Services   In-house Referral: Clinical Social Work     Living arrangements for the past 2 months: [place] Expected Discharge Date: 12/17/19                                     Social Determinants of Health (SDOH) Interventions    Readmission Risk Interventions Readmission Risk Prevention Plan 10/22/2019  Transportation Screening (No Data)  PCP or Specialist Appt within 3-5 Days Complete  HRI or [place] Complete  Social Work Consult for [place] Planning/Counseling Complete  Palliative Care Screening Not Applicable  Medication Review Press photographer) Complete  Some recent data might be hidden

## 2019-12-18 NOTE — TOC Progression Note (Addendum)
Transition of Care [facility]) - Progression Note    Patient Details  Name: Nicole [name] MRN: [id number] Date of Birth: Jul 21, 1945  Transition of Care [facility]) CM/SW [place], [place] Phone Number: 12/18/2019, 9:30 AM  Clinical Narrative:     [facility]- no availability [facility]- did has not responded as of today [place]- sent message to confirm availability-waiting on response. [facility]- called SNF- admission coordinator is out of the office- requested CSW call back after 10am.    CSW will continue to follow and assist with discharge planning.   Thurmond Butts, MSW, LCSWA Clinical Social Worker     Barriers to Discharge: Continued Medical Work up, SNF Pending bed offer, Ship broker, Programmer, applications ([facility])  Expected Discharge Plan and Services   In-house Referral: Clinical Social Work     Living arrangements for the past 2 months: [place] Expected Discharge Date: 12/17/19                                     Social Determinants of Health (SDOH) Interventions    Readmission Risk Interventions Readmission Risk Prevention Plan 10/22/2019  Transportation Screening (No Data)  PCP or Specialist Appt within 3-5 Days Complete  HRI or [place] Complete  Social Work Consult for [place] Planning/Counseling Complete  Palliative Care Screening Not Applicable  Medication Review Press photographer) Complete  Some recent data might be hidden

## 2019-12-18 NOTE — TOC Progression Note (Addendum)
Transition of Care [facility]) - Progression Note    Patient Details  Name: Nicole [name] MRN: [id number] Date of Birth: 1945-07-22  Transition of Care [facility]) CM/SW [place], [place] Phone Number: 12/18/2019, 10:26 AM  Clinical Narrative:     Nicole [name] has confirmed bed offer. CSW informed patient and family of confirmed bed offer.  CSW will started insurance authorization and received approval # [id number] from 10/09-10/12- Case Manager Gilberto Better   PSARR approved # [id number] E 10/7-11/6  CSW informed RN, covid test needed.  Thurmond Butts, MSW, LCSWA Clinical Social Worker      Barriers to Discharge: Continued Medical Work up, SNF Pending bed offer, Ship broker, Programmer, applications ([facility])  Expected Discharge Plan and Services   In-house Referral: Clinical Social Work     Living arrangements for the past 2 months: [place] Expected Discharge Date: 12/17/19                                     Social Determinants of Health (SDOH) Interventions    Readmission Risk Interventions Readmission Risk Prevention Plan 10/22/2019  Transportation Screening (No Data)  PCP or Specialist Appt within 3-5 Days Complete  HRI or [place] Complete  Social Work Consult for [place] Planning/Counseling Complete  Palliative Care Screening Not Applicable  Medication Review Press photographer) Complete  Some recent data might be hidden

## 2019-12-18 NOTE — Progress Notes (Signed)
Discharged to snf report given

## 2020-01-05 ENCOUNTER — Inpatient Hospital Stay: Payer: Medicare Other | Admitting: Internal Medicine

## 2020-01-07 ENCOUNTER — Telehealth: Payer: Self-pay | Admitting: Cardiology

## 2020-01-07 NOTE — Telephone Encounter (Signed)
Returned call to patient's daughter. Patient is having her first day of PT today after being discharge from hospital/rehab. She reports patient is having dizziness, nausea, feeling weak. She has not been taking her lasix, as it was not on rehab discharge paperwork. Explained it was prescribed on 10/8 at hospital discharge. Patient has enough medication at home and will resume. Advised to monitor daily weights, BP, pulse and bring log to next visit.

## 2020-01-07 NOTE — Telephone Encounter (Signed)
Patients daughter called and said that patient was in the hospital for 7 days and rehab for 2 days. Doctor did not prescribe her Lasix and she is having dizziness and nausea and really weak. Want to know if she should be back on Lasix. Please call to discuss.

## 2020-01-10 ENCOUNTER — Encounter (HOSPITAL_COMMUNITY): Payer: Self-pay

## 2020-01-10 ENCOUNTER — Other Ambulatory Visit: Payer: Self-pay

## 2020-01-10 ENCOUNTER — Emergency Department (HOSPITAL_COMMUNITY): Payer: Medicare Other

## 2020-01-10 ENCOUNTER — Inpatient Hospital Stay (HOSPITAL_COMMUNITY)
Admission: EM | Admit: 2020-01-10 | Discharge: 2020-01-15 | DRG: 872 | Disposition: A | Discharge status: Home Health | Payer: Medicare Other | Attending: Internal Medicine | Admitting: Internal Medicine

## 2020-01-10 ENCOUNTER — Telehealth: Payer: Self-pay | Admitting: Cardiology

## 2020-01-10 DIAGNOSIS — J45909 Unspecified asthma, uncomplicated: Secondary | ICD-10-CM | POA: Diagnosis present

## 2020-01-10 DIAGNOSIS — F329 Major depressive disorder, single episode, unspecified: Secondary | ICD-10-CM | POA: Diagnosis present

## 2020-01-10 DIAGNOSIS — F32A Depression, unspecified: Secondary | ICD-10-CM | POA: Diagnosis present

## 2020-01-10 DIAGNOSIS — H5509 Other forms of nystagmus: Secondary | ICD-10-CM | POA: Diagnosis present

## 2020-01-10 DIAGNOSIS — K2100 Gastro-esophageal reflux disease with esophagitis, without bleeding: Secondary | ICD-10-CM | POA: Diagnosis present

## 2020-01-10 DIAGNOSIS — Z7982 Long term (current) use of aspirin: Secondary | ICD-10-CM | POA: Diagnosis not present

## 2020-01-10 DIAGNOSIS — I252 Old myocardial infarction: Secondary | ICD-10-CM

## 2020-01-10 DIAGNOSIS — Z90710 Acquired absence of both cervix and uterus: Secondary | ICD-10-CM | POA: Diagnosis not present

## 2020-01-10 DIAGNOSIS — Z8616 Personal history of COVID-19: Secondary | ICD-10-CM

## 2020-01-10 DIAGNOSIS — A419 Sepsis, unspecified organism: Secondary | ICD-10-CM | POA: Diagnosis present

## 2020-01-10 DIAGNOSIS — J449 Chronic obstructive pulmonary disease, unspecified: Secondary | ICD-10-CM | POA: Diagnosis present

## 2020-01-10 DIAGNOSIS — I5022 Chronic systolic (congestive) heart failure: Secondary | ICD-10-CM | POA: Diagnosis present

## 2020-01-10 DIAGNOSIS — Z9049 Acquired absence of other specified parts of digestive tract: Secondary | ICD-10-CM

## 2020-01-10 DIAGNOSIS — Z7951 Long term (current) use of inhaled steroids: Secondary | ICD-10-CM | POA: Diagnosis not present

## 2020-01-10 DIAGNOSIS — E669 Obesity, unspecified: Secondary | ICD-10-CM | POA: Diagnosis not present

## 2020-01-10 DIAGNOSIS — F419 Anxiety disorder, unspecified: Secondary | ICD-10-CM | POA: Diagnosis present

## 2020-01-10 DIAGNOSIS — Z7984 Long term (current) use of oral hypoglycemic drugs: Secondary | ICD-10-CM | POA: Diagnosis not present

## 2020-01-10 DIAGNOSIS — Z91013 Allergy to seafood: Secondary | ICD-10-CM | POA: Diagnosis not present

## 2020-01-10 DIAGNOSIS — E1169 Type 2 diabetes mellitus with other specified complication: Secondary | ICD-10-CM | POA: Diagnosis present

## 2020-01-10 DIAGNOSIS — N390 Urinary tract infection, site not specified: Secondary | ICD-10-CM | POA: Diagnosis present

## 2020-01-10 DIAGNOSIS — J4520 Mild intermittent asthma, uncomplicated: Secondary | ICD-10-CM | POA: Diagnosis present

## 2020-01-10 DIAGNOSIS — E782 Mixed hyperlipidemia: Secondary | ICD-10-CM | POA: Diagnosis present

## 2020-01-10 DIAGNOSIS — M797 Fibromyalgia: Secondary | ICD-10-CM | POA: Diagnosis present

## 2020-01-10 DIAGNOSIS — E860 Dehydration: Secondary | ICD-10-CM | POA: Diagnosis present

## 2020-01-10 DIAGNOSIS — E876 Hypokalemia: Secondary | ICD-10-CM | POA: Diagnosis present

## 2020-01-10 DIAGNOSIS — R42 Dizziness and giddiness: Secondary | ICD-10-CM

## 2020-01-10 DIAGNOSIS — I2510 Atherosclerotic heart disease of native coronary artery without angina pectoris: Secondary | ICD-10-CM | POA: Diagnosis present

## 2020-01-10 DIAGNOSIS — Z79899 Other long term (current) drug therapy: Secondary | ICD-10-CM

## 2020-01-10 DIAGNOSIS — G4700 Insomnia, unspecified: Secondary | ICD-10-CM | POA: Diagnosis present

## 2020-01-10 LAB — CBC WITH DIFFERENTIAL/PLATELET
Abs Immature Granulocytes: 0.1 10*3/uL — ABNORMAL HIGH (ref 0.00–0.07)
Basophils Absolute: 0.1 10*3/uL (ref 0.0–0.1)
Basophils Relative: 1 %
Eosinophils Absolute: 0.1 10*3/uL (ref 0.0–0.5)
Eosinophils Relative: 0 %
HCT: 42.5 % (ref 36.0–46.0)
Hemoglobin: 14.6 g/dL (ref 12.0–15.0)
Immature Granulocytes: 1 %
Lymphocytes Relative: 20 %
Lymphs Abs: 2.8 10*3/uL (ref 0.7–4.0)
MCH: 30.4 pg (ref 26.0–34.0)
MCHC: 34.4 g/dL (ref 30.0–36.0)
MCV: 88.4 fL (ref 80.0–100.0)
Monocytes Absolute: 0.9 10*3/uL (ref 0.1–1.0)
Monocytes Relative: 6 %
Neutro Abs: 10 10*3/uL — ABNORMAL HIGH (ref 1.7–7.7)
Neutrophils Relative %: 72 %
Platelets: 325 10*3/uL (ref 150–400)
RBC: 4.81 MIL/uL (ref 3.87–5.11)
RDW: 14.2 % (ref 11.5–15.5)
WBC: 13.9 10*3/uL — ABNORMAL HIGH (ref 4.0–10.5)
nRBC: 0 % (ref 0.0–0.2)

## 2020-01-10 LAB — I-STAT CHEM 8, ED
BUN: 26 mg/dL — ABNORMAL HIGH (ref 8–23)
Calcium, Ion: 0.96 mmol/L — ABNORMAL LOW (ref 1.15–1.40)
Chloride: 93 mmol/L — ABNORMAL LOW (ref 98–111)
Creatinine, Ser: 0.8 mg/dL (ref 0.44–1.00)
Glucose, Bld: 123 mg/dL — ABNORMAL HIGH (ref 70–99)
HCT: 44 % (ref 36.0–46.0)
Hemoglobin: 15 g/dL (ref 12.0–15.0)
Potassium: 2.9 mmol/L — ABNORMAL LOW (ref 3.5–5.1)
Sodium: 139 mmol/L (ref 135–145)
TCO2: 33 mmol/L — ABNORMAL HIGH (ref 22–32)

## 2020-01-10 LAB — URINALYSIS, ROUTINE W REFLEX MICROSCOPIC
Bilirubin Urine: NEGATIVE
Glucose, UA: NEGATIVE mg/dL
Ketones, ur: NEGATIVE mg/dL
Nitrite: NEGATIVE
Protein, ur: 30 mg/dL — AB
Specific Gravity, Urine: 1.025 (ref 1.005–1.030)
WBC, UA: >50 WBC/hpf — ABNORMAL HIGH (ref 0–5)
pH: 5 (ref 5.0–8.0)

## 2020-01-10 LAB — MAGNESIUM: Magnesium: 0.3 mg/dL — CL (ref 1.7–2.4)

## 2020-01-10 LAB — BRAIN NATRIURETIC PEPTIDE: B Natriuretic Peptide: 57.1 pg/mL (ref 0.0–100.0)

## 2020-01-10 LAB — COMPREHENSIVE METABOLIC PANEL WITH GFR
ALT: 16 U/L (ref 0–44)
AST: 16 U/L (ref 15–41)
Albumin: 4.1 g/dL (ref 3.5–5.0)
Alkaline Phosphatase: 76 U/L (ref 38–126)
Anion gap: 16 — ABNORMAL HIGH (ref 5–15)
BUN: 23 mg/dL (ref 8–23)
CO2: 27 mmol/L (ref 22–32)
Calcium: 8.4 mg/dL — ABNORMAL LOW (ref 8.9–10.3)
Chloride: 95 mmol/L — ABNORMAL LOW (ref 98–111)
Creatinine, Ser: 0.89 mg/dL (ref 0.44–1.00)
GFR, Estimated: >60 mL/min
Glucose, Bld: 123 mg/dL — ABNORMAL HIGH (ref 70–99)
Potassium: 2.6 mmol/L — CL (ref 3.5–5.1)
Sodium: 138 mmol/L (ref 135–145)
Total Bilirubin: 1.2 mg/dL (ref 0.3–1.2)
Total Protein: 8.3 g/dL — ABNORMAL HIGH (ref 6.5–8.1)

## 2020-01-10 LAB — LACTIC ACID, PLASMA
Lactic Acid, Venous: 1.6 mmol/L (ref 0.5–1.9)
Lactic Acid, Venous: 1.7 mmol/L (ref 0.5–1.9)

## 2020-01-10 LAB — TROPONIN I (HIGH SENSITIVITY)
Troponin I (High Sensitivity): 19 ng/L — ABNORMAL HIGH
Troponin I (High Sensitivity): 19 ng/L — ABNORMAL HIGH (ref <18)

## 2020-01-10 MED ORDER — DIPHENHYDRAMINE HCL 50 MG/ML IJ SOLN
12.5000 mg | Freq: Once | INTRAMUSCULAR | Status: DC
  Filled 2020-01-10 (×2): qty 1

## 2020-01-10 MED ORDER — METOCLOPRAMIDE HCL 5 MG/ML IJ SOLN
10.0000 mg | Freq: Once | INTRAMUSCULAR | Status: AC
  Administered 2020-01-10: 10 mg via INTRAVENOUS
  Filled 2020-01-10: qty 2

## 2020-01-10 MED ORDER — MAGNESIUM SULFATE 2 GM/50ML IV SOLN
2.0000 g | Freq: Once | INTRAVENOUS | Status: AC
  Administered 2020-01-10: 2 g via INTRAVENOUS
  Filled 2020-01-10: qty 50

## 2020-01-10 MED ORDER — TICAGRELOR 90 MG PO TABS
90.0000 mg | ORAL_TABLET | Freq: Two times a day (BID) | ORAL | Status: DC
  Administered 2020-01-10 – 2020-01-15 (×10): 90 mg via ORAL
  Filled 2020-01-10 (×10): qty 1

## 2020-01-10 MED ORDER — LORATADINE 10 MG PO TABS
10.0000 mg | ORAL_TABLET | Freq: Every day | ORAL | Status: DC
  Administered 2020-01-11 – 2020-01-15 (×5): 10 mg via ORAL
  Filled 2020-01-10 (×5): qty 1

## 2020-01-10 MED ORDER — PANTOPRAZOLE SODIUM 40 MG PO TBEC
40.0000 mg | DELAYED_RELEASE_TABLET | Freq: Two times a day (BID) | ORAL | Status: DC
  Administered 2020-01-10 – 2020-01-15 (×10): 40 mg via ORAL
  Filled 2020-01-10 (×11): qty 1

## 2020-01-10 MED ORDER — SODIUM CHLORIDE 0.9 % IV SOLN: INTRAVENOUS | Status: AC

## 2020-01-10 MED ORDER — ACETAMINOPHEN 325 MG PO TABS
650.0000 mg | ORAL_TABLET | Freq: Once | ORAL | Status: AC
  Administered 2020-01-10: 650 mg via ORAL
  Filled 2020-01-10: qty 2

## 2020-01-10 MED ORDER — HEPARIN SODIUM (PORCINE) 5000 UNIT/ML IJ SOLN
5000.0000 [IU] | Freq: Three times a day (TID) | INTRAMUSCULAR | Status: DC
  Administered 2020-01-10 – 2020-01-11 (×3): 5000 [IU] via SUBCUTANEOUS
  Filled 2020-01-10 (×3): qty 1

## 2020-01-10 MED ORDER — CALCIUM CARBONATE ANTACID 500 MG PO CHEW
1.0000 | CHEWABLE_TABLET | Freq: Once | ORAL | Status: AC
  Administered 2020-01-10: 200 mg via ORAL
  Filled 2020-01-10: qty 1

## 2020-01-10 MED ORDER — SACUBITRIL-VALSARTAN 49-51 MG PO TABS
1.0000 | ORAL_TABLET | Freq: Two times a day (BID) | ORAL | Status: DC
  Administered 2020-01-10 – 2020-01-15 (×10): 1 via ORAL
  Filled 2020-01-10 (×10): qty 1

## 2020-01-10 MED ORDER — ENSURE ENLIVE PO LIQD
237.0000 mL | Freq: Two times a day (BID) | ORAL | Status: DC
  Administered 2020-01-11 – 2020-01-15 (×7): 237 mL via ORAL

## 2020-01-10 MED ORDER — SODIUM CHLORIDE 0.9 % IV SOLN
1.0000 g | Freq: Every day | INTRAVENOUS | Status: DC
  Administered 2020-01-11 – 2020-01-14 (×4): 1 g via INTRAVENOUS
  Filled 2020-01-10 (×4): qty 1

## 2020-01-10 MED ORDER — POTASSIUM CHLORIDE 10 MEQ/100ML IV SOLN
10.0000 meq | INTRAVENOUS | Status: AC
  Administered 2020-01-10: 10 meq via INTRAVENOUS
  Filled 2020-01-10: qty 100

## 2020-01-10 MED ORDER — FLUTICASONE FUROATE-VILANTEROL 200-25 MCG/INH IN AEPB
1.0000 | INHALATION_SPRAY | Freq: Every day | RESPIRATORY_TRACT | Status: DC
  Administered 2020-01-11 – 2020-01-15 (×5): 1 via RESPIRATORY_TRACT
  Filled 2020-01-10: qty 28

## 2020-01-10 MED ORDER — METOPROLOL SUCCINATE ER 50 MG PO TB24
50.0000 mg | ORAL_TABLET | Freq: Every day | ORAL | Status: DC
  Administered 2020-01-11 – 2020-01-15 (×5): 50 mg via ORAL
  Filled 2020-01-10 (×5): qty 1

## 2020-01-10 MED ORDER — LORAZEPAM 1 MG PO TABS
1.0000 mg | ORAL_TABLET | Freq: Two times a day (BID) | ORAL | Status: DC
  Administered 2020-01-10 – 2020-01-15 (×10): 1 mg via ORAL
  Filled 2020-01-10 (×10): qty 1

## 2020-01-10 MED ORDER — VITAMIN D (ERGOCALCIFEROL) 1.25 MG (50000 UNIT) PO CAPS
50000.0000 [IU] | ORAL_CAPSULE | ORAL | Status: DC
  Administered 2020-01-11: 50000 [IU] via ORAL
  Filled 2020-01-10: qty 1

## 2020-01-10 MED ORDER — PROMETHAZINE HCL 25 MG PO TABS
12.5000 mg | ORAL_TABLET | Freq: Four times a day (QID) | ORAL | Status: DC | PRN
  Administered 2020-01-11 – 2020-01-12 (×3): 12.5 mg via ORAL
  Filled 2020-01-10 (×3): qty 1

## 2020-01-10 MED ORDER — LORAZEPAM 2 MG/ML IJ SOLN
1.0000 mg | Freq: Once | INTRAMUSCULAR | Status: AC
  Administered 2020-01-10: 1 mg via INTRAVENOUS
  Filled 2020-01-10: qty 1

## 2020-01-10 MED ORDER — ACETAMINOPHEN 325 MG PO TABS
650.0000 mg | ORAL_TABLET | Freq: Four times a day (QID) | ORAL | Status: DC | PRN
  Administered 2020-01-14: 650 mg via ORAL
  Filled 2020-01-10: qty 2

## 2020-01-10 MED ORDER — KETOROLAC TROMETHAMINE 15 MG/ML IJ SOLN
15.0000 mg | Freq: Once | INTRAMUSCULAR | Status: AC
  Administered 2020-01-10: 15 mg via INTRAVENOUS
  Filled 2020-01-10: qty 1

## 2020-01-10 MED ORDER — POTASSIUM CHLORIDE 10 MEQ/100ML IV SOLN
10.0000 meq | INTRAVENOUS | Status: AC
  Administered 2020-01-10 (×2): 10 meq via INTRAVENOUS
  Filled 2020-01-10: qty 100

## 2020-01-10 MED ORDER — DULOXETINE HCL 60 MG PO CPEP
60.0000 mg | ORAL_CAPSULE | Freq: Every day | ORAL | Status: DC
  Administered 2020-01-11 – 2020-01-15 (×5): 60 mg via ORAL
  Filled 2020-01-10 (×5): qty 1

## 2020-01-10 MED ORDER — ALBUTEROL SULFATE HFA 108 (90 BASE) MCG/ACT IN AERS
2.0000 | INHALATION_SPRAY | RESPIRATORY_TRACT | Status: DC | PRN
  Filled 2020-01-10: qty 6.7

## 2020-01-10 MED ORDER — SODIUM CHLORIDE 0.9 % IV SOLN
1.0000 g | Freq: Once | INTRAVENOUS | Status: AC
  Administered 2020-01-10: 1 g via INTRAVENOUS
  Filled 2020-01-10: qty 10

## 2020-01-10 MED ORDER — ASPIRIN EC 81 MG PO TBEC
81.0000 mg | DELAYED_RELEASE_TABLET | Freq: Every day | ORAL | Status: DC
  Administered 2020-01-11 – 2020-01-15 (×5): 81 mg via ORAL
  Filled 2020-01-10 (×5): qty 1

## 2020-01-10 MED ORDER — MECLIZINE HCL 25 MG PO TABS
25.0000 mg | ORAL_TABLET | Freq: Once | ORAL | Status: AC
  Administered 2020-01-10: 25 mg via ORAL
  Filled 2020-01-10: qty 1

## 2020-01-10 MED ORDER — TRAMADOL HCL 50 MG PO TABS
50.0000 mg | ORAL_TABLET | Freq: Four times a day (QID) | ORAL | Status: DC | PRN
  Administered 2020-01-12 – 2020-01-14 (×4): 50 mg via ORAL
  Filled 2020-01-10 (×4): qty 1

## 2020-01-10 MED ORDER — ONDANSETRON HCL 4 MG/2ML IJ SOLN
4.0000 mg | Freq: Once | INTRAMUSCULAR | Status: AC
  Administered 2020-01-10: 4 mg via INTRAVENOUS
  Filled 2020-01-10: qty 2

## 2020-01-10 MED ORDER — ACETAMINOPHEN 650 MG RE SUPP: 650.0000 mg | Freq: Four times a day (QID) | RECTAL | Status: DC | PRN

## 2020-01-10 NOTE — ED Notes (Addendum)
Date and time results received: 01/10/20 2030 (use smartphrase ".now" to insert current time)  Test: Magnesium Critical Value: 0.3  Name of Provider Notified:[facility] Orders Received? Or Actions Taken?: waiting on orders

## 2020-01-10 NOTE — H&P (Signed)
TRH H&P    Patient Demographics:    Nicole [name], is a 74 y.o. female  MRN: [id number]  DOB - 22-Apr-1945  Admit Date - 01/10/2020  Referring MD/NP/PA: Darl Householder  Outpatient Primary MD for the patient is [name], MD  Patient coming from: Home  Chief complaint- dizziness and generalized weakness   HPI:    Nicole [name]  is a 74 y.o. female, with hx of T2DM, HLD, CHF, CAD, Fibromyalgia, IBS, GERD, and more presents to the ED with weakness and dizziness. Patient was hospitalized a month ago for sepsis 2/2 pneumonia, NSTEMI, and hypokalemia. She had been vomiting prior to that hospitalization as well. She was discharged to rehab, was in rehab for 2 weeks, and returned home 1 week ago. 3 days ago patient started to complain of dizziness, and weakness. 2 days ago patient started having nausea and vomiting every time she tried to ingest liquids or solids. 2 days ago her BP was 230/135, because she could not keep her BP meds down. When they were finally able to get her to keep Toprol down, her BP improved to 140s. Patient has had a decrease in appetite. She has had a decrease in urine output. Patient reports that she has had double vision causing a headache. She has had eye twitching, and paresthesias in her fingertips and toes. Patient reports that today she became so weak that she can't walk. When she attempts to ambulate she falls. They do have home PT s/p rehab, but she feels like she was getting worse, and not better. Daughter called cardiology today who rec'd to go to the ER.   Last normal meal was 4 days ago. Vomiting countless times per day. Non smoker, non drinker, no illicit drugs. Full code.   In the ED T 100.4, HR 104, RR 16, BP 129/87, 99% WBC 13.9, Hgb 14.6 BMP = K+ 2.6, Cl 93, glucose 123 Trop 19 (down from 642 1 month ago) Blood cultures and urine culture pending UA indicative of UTI CXR = improved lung  aeration CT renal stone study = no renal stone CT head = no acute abnormality MRI brain = no acute abnormality Tyl, Calcium Carb, Rocephin, Lorazepam, Meclizine, zofran given in the ED Admission requested for further management of electrolyte disturbances and sepsis 2/2 UTI    Review of systems:    In addition to the HPI above,  No Fever-chills, Positive for headache  Double vision, no changes in hearing, No problems swallowing food or Liquids, No Chest pain, Cough or Shortness of Breath, Mild abdominal pain (from retching) No Nausea or Vomiting, bowel movements are regular, No Blood in stool or Urine, No dysuria, Positive for decrease in urine output No new skin rashes or bruises, No new joints pains-aches,  No new weakness,Positive for tingling in BL fingertips and toes No recent weight gain or loss, No polyuria, polydypsia or polyphagia, No significant Mental Stressors.  All other systems reviewed and are negative.    Past History of the following :    Past Medical History:  Diagnosis  Date  . Anemia   . Anxiety   . Back pain   . CAD (coronary artery disease) 10/22/2019   DES to [facility] August 2021  . Colon polyps   . COVID-28 June 2019  . Endometriosis   . Fibromyalgia   . GERD with esophagitis    Follows with Dr. Fuller Plan with GI, EGD in the past  . IBS (irritable bowel syndrome)   . Major depressive disorder   . Migraine   . Mild intermittent asthma   . Mixed diabetic hyperlipidemia associated with type 2 diabetes mellitus ([place])   . Obesity   . Type 2 diabetes mellitus ([place])   . Urinary incontinence       Past Surgical History:  Procedure Laterality Date  . ABDOMINAL HYSTERECTOMY     TAH BSO  . APPENDECTOMY  1999  . CATARACT EXTRACTION     both  . CHOLECYSTECTOMY    . CORONARY STENT INTERVENTION N/A 10/21/2019   Procedure: CORONARY STENT INTERVENTION;  Surgeon: Troy Sine, MD;  Location: [place] [facility];  Service: Cardiovascular;  Laterality:  N/A;  . DENTAL SURGERY    . HERNIA REPAIR  2000  . LAPAROSCOPIC ENDOMETRIOSIS FULGURATION    . RIGHT/LEFT HEART CATH AND CORONARY ANGIOGRAPHY N/A 10/21/2019   Procedure: RIGHT/LEFT HEART CATH AND CORONARY ANGIOGRAPHY;  Surgeon: Troy Sine, MD;  Location: [place] [facility];  Service: Cardiovascular;  Laterality: N/A;      Social History:      Social History   Tobacco Use  . Smoking status: Never Smoker  . Smokeless tobacco: Never Used  Substance Use Topics  . Alcohol use: Yes    Alcohol/week: 0.0 standard drinks    Comment: occasional       Family History :     Family History  Problem Relation Age of Onset  . Hypertension Mother   . Heart failure Mother   . Diabetes Mother   . Hypertension Father   . Heart failure Father   . Diabetes Father   . Heart attack Father   . Stroke Father   . Cancer Brother        lung  . Thyroid disease Brother   . Cancer Brother        throat  . Cancer Paternal Aunt        bone  . Cancer Paternal Uncle        lung  . Hypertension Maternal Grandmother   . Hypertension Maternal Grandfather   . Hypertension Paternal Grandmother   . Hypertension Paternal Grandfather   . Diabetes Daughter   . Colon cancer Neg Hx   . Stomach cancer Neg Hx       Home Medications:   Prior to Admission medications   Medication Sig Start Date End Date Taking? Authorizing Provider  Ascorbic Acid (VITAMIN C PO) Take 1 tablet by mouth daily.    [provider]  aspirin 81 MG tablet Take 81 mg by mouth daily.    [provider]  atorvastatin (LIPITOR) 80 MG tablet Take 1 tablet (80 mg total) by mouth at bedtime. 11/19/19 12/19/19  Martinique, Peter M, MD  bismuth subsalicylate (PEPTO BISMOL) 262 MG/15ML suspension Take 30 mLs by mouth every 6 (six) hours as needed for indigestion.    [provider]  DULoxetine (CYMBALTA) 60 MG capsule Take 60 mg by mouth daily. 08/10/19   [provider]  ergocalciferol (VITAMIN D2)  50000 UNITS capsule Take 50,000  Units by mouth once a week.      [provider]  fluticasone (FLONASE) 50 MCG/ACT nasal spray Place 1 spray into both nostrils daily as needed for allergies or rhinitis.  05/28/19   [provider]  Fluticasone-Salmeterol (ADVAIR DISKUS) 250-50 MCG/DOSE AEPB Inhale 1 puff into the lungs 2 (two) times daily.    [provider]  furosemide (LASIX) 40 MG tablet Take 1 tablet (40 mg total) by mouth daily. 12/17/19   Charlynne Cousins, MD  loratadine (CLARITIN) 10 MG tablet Take 1 tablet (10 mg total) by mouth daily. 06/28/19   Ghimire, Henreitta Leber, MD  LORazepam (ATIVAN) 1 MG tablet Take 1 tablet (1 mg total) by mouth 2 (two) times daily. 12/18/19   Charlynne Cousins, MD  metoprolol succinate (TOPROL-XL) 50 MG 24 hr tablet Take 1 tablet (50 mg total) by mouth daily. Take with or immediately following a meal. 12/18/19   Charlynne Cousins, MD  NONFORMULARY OR COMPOUNDED ITEM Estradiol 0.02% vaginal cream prefilled applicators apply one twice weekly 05/23/15   Fontaine, Belinda Block, MD  ondansetron (ZOFRAN) 4 MG tablet Take 4 mg by mouth every 8 (eight) hours as needed for nausea or vomiting.  11/22/19   [provider]  Jonetta Speak LANCETS 24P MISC  06/03/16   [provider]  [facility] VERIO test strip  06/03/16   [provider]  oxyCODONE-acetaminophen (PERCOCET) 10-325 MG tablet Take 1 tablet by mouth 2 (two) times daily as needed for pain. 12/18/19   Charlynne Cousins, MD  pantoprazole (PROTONIX) 40 MG tablet Take 1 tablet (40 mg total) by mouth 2 (two) times daily. 08/14/11   Ladene Artist, MD  potassium chloride SA (KLOR-CON) 20 MEQ tablet Take 2 tablets (40 mEq total) by mouth daily. 11/19/19 12/19/19  Martinique, Peter M, MD  PROAIR HFA 108 [phone] Base) MCG/ACT inhaler Inhale 2 puffs into the lungs every 4 (four) hours as needed for wheezing or shortness of breath. Uses as rescue inhaler Reported on 06/20/2015 Patient  taking differently: Inhale 2 puffs into the lungs every 4 (four) hours as needed for wheezing or shortness of breath. Uses as rescue inhaler Reported on 10/16/2019 06/28/19   Jonetta Osgood, MD  sacubitril-valsartan (ENTRESTO) 49-51 MG Take 1 tablet by mouth 2 (two) times daily. 12/17/19   Charlynne Cousins, MD  ticagrelor (BRILINTA) 90 MG TABS tablet Take 90 mg by mouth 2 (two) times daily.    [provider]     Allergies:     Allergies  Allergen Reactions  . Fish-Derived Products Anaphylaxis  . Shellfish Allergy Shortness Of Breath    In dye for scan.  . Aspirin Nausea Only and Other (See Comments)    Pt stated it only gives her problems when she takes the high dose (325 mg)  . Benadryl [Diphenhydramine]     "flips out with it. Eyes roll to the back of her head."  . Ivp Dye [Iodinated Diagnostic Agents]     Hx of ? anaphlaxis  . Latex Other (See Comments)    Pt says she gets a "stinky infection"     Physical Exam:   Vitals  Blood pressure 129/87, pulse (!) 104, temperature (!) 100.4 F (38 C), temperature source Rectal, resp. rate 16, height $RemoveBe'5\' 2"'[id number]$  (1.575 m), weight 65.8 kg, SpO2 99 %.  1.  General: Laying supine in bed, no acute distress  2. Psychiatric: Behavior and mood are normal for situation  3. Neurologic:  Nystagmus with right gaze, pupils reactive, face symmetrical, equal sensation BL in upper and lower extremities, AO x 3  4. HEENMT:  Head is atraumatic, normocephalic, pupils reactive, neck is supple, trachea is midline, mucous membranes mildly dry  5. Respiratory : LCTABL, no wheeze, rale, or crackles  6. Cardiovascular : HR is tachycardic, rhythm is regular, no murmurs, rubs, or gallops  7. Gastrointestinal:  Abdomen is soft, non distended, mildly tender in the epigastric region, no palpable masses  8. Skin:  No acute lesion on limited skin exam  9.Musculoskeletal:  No acute deformity, peripheral edema, peripheral pulses palpated.      Data Review:    CBC Recent Labs  Lab 01/10/20 1636 01/10/20 1655  WBC 13.9*  --   HGB 14.6 15.0  HCT 42.5 44.0  PLT 325  --   MCV 88.4  --   MCH 30.4  --   MCHC 34.4  --   RDW 14.2  --   LYMPHSABS 2.8  --   MONOABS 0.9  --   EOSABS 0.1  --   BASOSABS 0.1  --    ------------------------------------------------------------------------------------------------------------------  Results for orders placed or performed during the hospital encounter of 01/10/20 (from the past 48 hour(s))  Lactic acid, plasma     Status: None   Collection Time: 01/10/20  4:20 PM  Result Value Ref Range   Lactic Acid, Venous 1.6 0.5 - 1.9 mmol/L    Comment: Performed at [facility], [place] [address], [place], [place] [address]  Brain natriuretic peptide     Status: None   Collection Time: 01/10/20  4:26 PM  Result Value Ref Range   B Natriuretic Peptide 57.1 0.0 - 100.0 pg/mL    Comment: Performed at [facility], [place] [address] [place], [place] [address]  CBC with Differential/Platelet     Status: Abnormal   Collection Time: 01/10/20  4:36 PM  Result Value Ref Range   WBC 13.9 (H) 4.0 - 10.5 K/uL   RBC 4.81 3.87 - 5.11 MIL/uL   Hemoglobin 14.6 12.0 - 15.0 g/dL   HCT 42.5 36 - 46 %   MCV 88.4 80.0 - 100.0 fL   MCH 30.4 26.0 - 34.0 pg   MCHC 34.4 30.0 - 36.0 g/dL   RDW 14.2 11.5 - 15.5 %   Platelets 325 150 - 400 K/uL   nRBC 0.0 0.0 - 0.2 %   Neutrophils Relative % 72 %   Neutro Abs 10.0 (H) 1.7 - 7.7 K/uL   Lymphocytes Relative 20 %   Lymphs Abs 2.8 0.7 - 4.0 K/uL   Monocytes Relative 6 %   Monocytes Absolute 0.9 0.1 - 1.0 K/uL   Eosinophils Relative 0 %   Eosinophils Absolute 0.1 0.0 - 0.5 K/uL   Basophils Relative 1 %   Basophils Absolute 0.1 0.0 - 0.1 K/uL   Immature Granulocytes 1 %   Abs Immature Granulocytes 0.10 (H) 0.00 - 0.07 K/uL    Comment: Performed at [facility], [place] [address], [place], [place] [address]    Comprehensive metabolic panel     Status: Abnormal   Collection Time: 01/10/20  4:36 PM  Result Value Ref Range   Sodium 138 135 - 145 mmol/L   Potassium 2.6 (LL) 3.5 - 5.1 mmol/L    Comment: CRITICAL RESULT CALLED TO, READ BACK BY AND VERIFIED WITH: KISER,K. RN $RemoveBef'@[id number]'[id number]$  01/10/20 BILLINGSLEY,L    Chloride 95 (L) 98 - 111 mmol/L  CO2 27 22 - 32 mmol/L   Glucose, Bld 123 (H) 70 - 99 mg/dL    Comment: Glucose reference range applies only to samples taken after fasting for at least 8 hours.   BUN 23 8 - 23 mg/dL   Creatinine, Ser 0.89 0.44 - 1.00 mg/dL   Calcium 8.4 (L) 8.9 - 10.3 mg/dL   Total Protein 8.3 (H) 6.5 - 8.1 g/dL   Albumin 4.1 3.5 - 5.0 g/dL   AST 16 15 - 41 U/L   ALT 16 0 - 44 U/L   Alkaline Phosphatase 76 38 - 126 U/L   Total Bilirubin 1.2 0.3 - 1.2 mg/dL   GFR, Estimated >60 >60 mL/min    Comment: (NOTE) Calculated using the CKD-EPI Creatinine Equation (2021)    Anion gap 16 (H) 5 - 15    Comment: Performed at [facility], [place] [address], [place], [place] [address]  Troponin I (High Sensitivity)     Status: Abnormal   Collection Time: 01/10/20  4:36 PM  Result Value Ref Range   Troponin I (High Sensitivity) 19 (H) <18 ng/L    Comment: (NOTE) Elevated high sensitivity troponin I (hsTnI) values and significant  changes across serial measurements may suggest ACS but many other  chronic and acute conditions are known to elevate hsTnI results.  Refer to the Links section for chest pain algorithms and additional  guidance. Performed at [facility], [place] [address] [place], [place] [address]   Urinalysis, Routine w reflex microscopic     Status: Abnormal   Collection Time: 01/10/20  4:36 PM  Result Value Ref Range   Color, Urine AMBER (A) YELLOW    Comment: BIOCHEMICALS MAY BE AFFECTED BY COLOR   APPearance CLOUDY (A) CLEAR   Specific Gravity, Urine 1.025 1.005 - 1.030   pH 5.0 5.0 - 8.0   Glucose, UA NEGATIVE NEGATIVE mg/dL    Hgb urine dipstick SMALL (A) NEGATIVE   Bilirubin Urine NEGATIVE NEGATIVE   Ketones, ur NEGATIVE NEGATIVE mg/dL   Protein, ur 30 (A) NEGATIVE mg/dL   Nitrite NEGATIVE NEGATIVE   Leukocytes,Ua LARGE (A) NEGATIVE   RBC / HPF 6-10 0 - 5 RBC/hpf   WBC, UA >50 (H) 0 - 5 WBC/hpf   Bacteria, UA MANY (A) NONE SEEN   Squamous Epithelial / LPF 11-20 0 - 5   Mucus PRESENT    Hyaline Casts, UA PRESENT     Comment: Performed at [facility], [place] [address], [place], [place] [address]  I-stat chem 8, ED (not at [facility] or [facility])     Status: Abnormal   Collection Time: 01/10/20  4:55 PM  Result Value Ref Range   Sodium 139 135 - 145 mmol/L   Potassium 2.9 (L) 3.5 - 5.1 mmol/L   Chloride 93 (L) 98 - 111 mmol/L   BUN 26 (H) 8 - 23 mg/dL   Creatinine, Ser 0.80 0.44 - 1.00 mg/dL   Glucose, Bld 123 (H) 70 - 99 mg/dL    Comment: Glucose reference range applies only to samples taken after fasting for at least 8 hours.   Calcium, Ion 0.96 (L) 1.15 - 1.40 mmol/L   TCO2 33 (H) 22 - 32 mmol/L   Hemoglobin 15.0 12.0 - 15.0 g/dL   HCT 44.0 36 - 46 %  Lactic acid, plasma     Status: None   Collection Time: 01/10/20  6:20 PM  Result Value Ref Range   Lactic Acid,  Venous 1.7 0.5 - 1.9 mmol/L    Comment: Performed at [facility], [place] [address], [place], [place] [address]  Troponin I (High Sensitivity)     Status: Abnormal   Collection Time: 01/10/20  6:20 PM  Result Value Ref Range   Troponin I (High Sensitivity) 19 (H) <18 ng/L    Comment: (NOTE) Elevated high sensitivity troponin I (hsTnI) values and significant  changes across serial measurements may suggest ACS but many other  chronic and acute conditions are known to elevate hsTnI results.  Refer to the "Links" section for chest pain algorithms and additional  guidance. Performed at [facility], [place] [address] [place], [place] [address]     Chemistries  Recent Labs  Lab 01/10/20 1636  01/10/20 1655  NA 138 139  K 2.6* 2.9*  CL 95* 93*  CO2 27  --   GLUCOSE 123* 123*  BUN 23 26*  CREATININE 0.89 0.80  CALCIUM 8.4*  --   AST 16  --   ALT 16  --   ALKPHOS 76  --   BILITOT 1.2  --    ------------------------------------------------------------------------------------------------------------------  ------------------------------------------------------------------------------------------------------------------ GFR: Estimated Creatinine Clearance: 56.6 mL/min (by C-G formula based on SCr of 0.8 mg/dL). Liver Function Tests: Recent Labs  Lab 01/10/20 1636  AST 16  ALT 16  ALKPHOS 76  BILITOT 1.2  PROT 8.3*  ALBUMIN 4.1   No results for input(s): LIPASE, AMYLASE in the last 168 hours. No results for input(s): AMMONIA in the last 168 hours. Coagulation Profile: No results for input(s): INR, PROTIME in the last 168 hours. Cardiac Enzymes: No results for input(s): CKTOTAL, CKMB, CKMBINDEX, TROPONINI in the last 168 hours. BNP (last 3 results) No results for input(s): PROBNP in the last 8760 hours. HbA1C: No results for input(s): HGBA1C in the last 72 hours. CBG: No results for input(s): GLUCAP in the last 168 hours. Lipid Profile: No results for input(s): CHOL, HDL, LDLCALC, TRIG, CHOLHDL, LDLDIRECT in the last 72 hours. Thyroid Function Tests: No results for input(s): TSH, T4TOTAL, FREET4, T3FREE, THYROIDAB in the last 72 hours. Anemia Panel: No results for input(s): VITAMINB12, FOLATE, FERRITIN, TIBC, IRON, RETICCTPCT in the last 72 hours.  --------------------------------------------------------------------------------------------------------------- Urine analysis:    Component Value Date/Time   COLORURINE AMBER (A) 01/10/2020 1636   APPEARANCEUR CLOUDY (A) 01/10/2020 1636   LABSPEC 1.025 01/10/2020 1636   PHURINE 5.0 01/10/2020 1636   GLUCOSEU NEGATIVE 01/10/2020 1636   HGBUR SMALL (A) 01/10/2020 1636   BILIRUBINUR NEGATIVE 01/10/2020 1636    KETONESUR NEGATIVE 01/10/2020 1636   PROTEINUR 30 (A) 01/10/2020 1636   UROBILINOGEN 0.2 02/23/2014 1528   NITRITE NEGATIVE 01/10/2020 1636   LEUKOCYTESUR LARGE (A) 01/10/2020 1636      Imaging Results:    CT Head Wo Contrast  Result Date: 01/10/2020 CLINICAL DATA:  Dizziness, nonspecific; mental status change, unknown cause. EXAM: CT HEAD WITHOUT CONTRAST TECHNIQUE: Contiguous axial images were obtained from the base of the skull through the vertex without intravenous contrast. COMPARISON:  Prior head CT 12/14/2019.  Brain MRI 12/12/2019. FINDINGS: Brain: Mild ill-defined hypoattenuation within the cerebral white matter is nonspecific, but compatible with chronic small vessel ischemic disease. There is no acute intracranial hemorrhage. No demarcated cortical infarct. No extra-axial fluid collection. No evidence of intracranial mass. No midline shift. Partially empty sella turcica. Vascular: No hyperdense vessel.  Atherosclerotic calcifications. Skull: Normal. Negative for fracture or focal suspicious lesion. Sinuses/Orbits: Visualized orbits show no acute finding. Opacification of  a right ethmoid air cell. Other: No significant mastoid effusion at the imaged levels. IMPRESSION: No evidence of acute intracranial abnormality. Mild cerebral white matter chronic small vessel ischemic disease. Mild right ethmoid sinusitis. Electronically Signed   By: Kellie Simmering DO   On: 01/10/2020 17:43   MR BRAIN WO CONTRAST  Result Date: 01/10/2020 CLINICAL DATA:  Transient ischemic attack.  Dizziness. EXAM: MRI HEAD WITHOUT CONTRAST TECHNIQUE: Multiplanar, multiecho pulse sequences of the brain and surrounding structures were obtained without intravenous contrast. COMPARISON:  None. FINDINGS: Brain: No acute infarction, hemorrhage, hydrocephalus, extra-axial collection or mass lesion. Scattered foci of T2 hyperintensity are seen within the white matter of the cerebral hemispheres, nonspecific, most likely related to  chronic small vessel ischemia. Vascular: Normal flow voids. Skull and upper cervical spine: Normal marrow signal. Sinuses/Orbits: Bilateral lens surgery. Mild mucosal thickening of ethmoid cells. Mild bilateral mastoid effusions. Other: None. IMPRESSION: 1. No acute intracranial abnormality. 2. Mild chronic small vessel ischemic changes of the white matter. 3. Mild bilateral mastoid effusions. Electronically Signed   By: Pedro Earls M.D.   On: 01/10/2020 18:34   DG Chest Port 1 View  Result Date: 01/10/2020 CLINICAL DATA:  Weakness. EXAM: PORTABLE CHEST 1 VIEW COMPARISON:  12/13/2019 FINDINGS: Improved lung aeration from prior exam with decrease interstitial coarsening. Heart is normal in size. Rounded retrocardiac density is unchanged from prior exam and likely represents a hiatal hernia. Pulmonary vasculature is normal. No consolidation, pleural effusion, or pneumothorax. No acute osseous abnormalities are seen. IMPRESSION: 1. Improved lung aeration from prior exam.  No acute findings. 2. Probable hiatal hernia. Electronically Signed   By: Keith Rake M.D.   On: 01/10/2020 17:10   CT Renal Stone Study  Result Date: 01/10/2020 CLINICAL DATA:  Nausea, vomiting, weakness EXAM: CT ABDOMEN AND PELVIS WITHOUT CONTRAST TECHNIQUE: Multidetector CT imaging of the abdomen and pelvis was performed following the standard protocol without IV contrast. COMPARISON:  12/13/2019 FINDINGS: Lower chest: No acute pleural or parenchymal lung disease. Moderate hiatal hernia. Hepatobiliary: No focal liver abnormality is seen. Status post cholecystectomy. No biliary dilatation. Pancreas: Unremarkable. No pancreatic ductal dilatation or surrounding inflammatory changes. Spleen: 1.1 cm hypodensity inferior margin of the spleen likely hemangioma based on ultrasound imaging. No other splenic abnormalities. Adrenals/Urinary Tract: 2.7 cm left renal cyst. Small area of fat attenuation ventral aspect left kidney  measuring 0.6 cm, reference image 29 of series 2 most consistent with small angiomyolipoma. Right kidney is unremarkable. No urinary tract calculi or obstructive uropathy. The adrenals are normal. Bladder is decompressed, limiting evaluation. Stomach/Bowel: No bowel obstruction or ileus. Normal appendix right lower quadrant. No bowel wall thickening or inflammatory change. Vascular/Lymphatic: Aortic atherosclerosis. No enlarged abdominal or pelvic lymph nodes. Reproductive: Status post hysterectomy. No adnexal masses. Other: No free fluid or free gas. Small fat containing umbilical hernia. Musculoskeletal: No acute or destructive bony lesions. Reconstructed images demonstrate no additional findings. IMPRESSION: 1. No urinary tract calculi or obstructive uropathy. 2. Moderate hiatal hernia. 3. Fat containing umbilical hernia. 4.  Aortic Atherosclerosis (ICD10-I70.0). Electronically Signed   By: Randa Ngo M.D.   On: 01/10/2020 17:44      Assessment & Plan:    Active Problems:   Sepsis ([place])   1. Sepsis 2/2 UTI 1. WBC 13.4, T 100.4, HR 104 2. Lactate 1.6 3. UA indicative of UTI 4. Decreased urine output last 2 days 5. Urine culture pending 6. Blood Culture pending 7. Continue Rocephin 8. CXR = improved from  prior 9. CT renal stone = no stone 10. Monitor on tele 2. UTI 1. See plan above 3. Nystagmus 1. Likely 2/2 hypokalemia 2. MRI brain and CT head = no acute abnormality 4. Hypokalemia/hypomagnesemia 1. 2.6 at presentation 2. 2.9 after 10Meq 3. 2/2 GI losses 4. Patient is nauseous - continue IV replacement vs oral 5. Mag is 0.3 6. Replace and recheck 7. EKG pending 5. Generalized weakness 1. Likely multifactorial 2. Recent admission to hospital and rehab = deconditioning, consult PT 3. Hypokalemia also contributing - see plan above 4. Infection also contributing - See plan above 6. Dehydration 1. BUN 26, Cr 0.88 2. Tachy 3. BP soft  4. No lactic acidosis 5. Continue IV  fluid hydration - gentle in the setting of CHF 7. CHF 1. Holding lasix 2/2 dehydration 2. Continue home meds    DVT Prophylaxis-   heparin- SCDs   AM Labs Ordered, also please review Full Orders  Family Communication: Admission, patients condition and plan of care including tests being ordered have been discussed with the patient and daughter who indicate understanding and agree with the plan and Code Status.  Code Status:  Full  Admission status: Inpatient :The appropriate admission status for this patient is INPATIENT. Inpatient status is judged to be reasonable and necessary in order to provide the required intensity of service to ensure the patient's safety. The patient's presenting symptoms, physical exam findings, and initial radiographic and laboratory data in the context of their chronic comorbidities is felt to place them at high risk for further clinical deterioration. Furthermore, it is not anticipated that the patient will be medically stable for discharge from the hospital within 2 midnights of admission. The following factors support the admission status of inpatient.     The patient's presenting symptoms include Weakness The worrisome physical exam findings include SIRS criteria met The initial radiographic and laboratory data are worrisome because of UA indicative of UTI, urine culture pending The chronic co-morbidities include T2DM, HLD, IBS, GERD, Fibromyalgia, CAD       * I certify that at the point of admission it is my clinical judgment that the patient will require inpatient hospital care spanning beyond 2 midnights from the point of admission due to high intensity of service, high risk for further deterioration and high frequency of surveillance required.*  Time spent in minutes : [place]

## 2020-01-10 NOTE — Telephone Encounter (Addendum)
I am acting in triage role today to assist with patient callbacks.  Patient's daughter [name] called very concerned. Her mother has been feeling terrible since Friday with elevated blood pressure, nausea, vomiting. Blood sugar has been elevated in the 200s. At this point she is unable to keep anything down and is very weak. Today her BP 140/80 and pulse 101. She is supposed to be on DAPT given recent PCI 10/2019 but has been vomiting frequently so unclear if she has gotten any absorption. (Brilinta had fallen off med list but confirmed with daughter she is still taking it.) [name] says the last time the patient was in the hospital she had some eye twitching which they've noticed sometimes this weekend. Given the severity of symptoms and complex PMH, I have recommended they proceed to ED for evaluation. We discussed EMS as an option given how weak she is. [name] verbalized understanding and gratitude.   Will route to primary cardiologist Dr. Martinique just as Juluis Rainier.  Lofton Leon PA-C

## 2020-01-10 NOTE — ED Triage Notes (Signed)
Patient BIB [facility] with complaints of nausea, vomiting, weakness the past 3 days. Was at [facility] for the same thing and was admitted then sent to rehab a week ago and then sent home Friday with same symptoms, dizziness, nausea, abnormal gate and feels like the room is spinning.   EMS Vitals 130/78 HR 115 97% Room Air CBG 215

## 2020-01-10 NOTE — ED Notes (Signed)
X RAY at bedside 

## 2020-01-10 NOTE — ED Notes (Signed)
Patient is back from MRI  

## 2020-01-10 NOTE — ED Notes (Signed)
Date and time results received: 01/10/20 18:28  Test: K Critical Value: 2.6  Name of Provider Notified: Darl Householder, MD  Orders Received? Or Actions Taken?: new orders

## 2020-01-10 NOTE — ED Notes (Signed)
Report called to [facility]

## 2020-01-10 NOTE — ED Notes (Signed)
Patient transported to MRI 

## 2020-01-10 NOTE — ED Provider Notes (Signed)
[facility] DEPT Provider Note   CSN: [id number] Arrival date & time: 01/10/20  1453     History Chief Complaint  Patient presents with  . Dizziness  . Nausea  . Emesis  . Headache    Nicole [name] is a 74 y.o. female history of Covid, IBS, diabetes, CAD here presenting with vomiting and headache and dizziness.  Patient has a complicated admission about a month ago.  Patient was admitted for weakness and was thought to be bacteremic and also had a NSTEMI in the hospital.  Patient eventually went to rehab for about 2 weeks.  Patient just got home 2 days ago.  Patient states that since she got home, she has been feeling dizzy.  She feels that the room is spinning and she is unable to walk.  Patient also has been feeling diffusely weak and has been vomiting and has abdominal pain as well.   The history is provided by the patient.       Past Medical History:  Diagnosis Date  . Anemia   . Anxiety   . Back pain   . CAD (coronary artery disease) 10/22/2019   DES to [facility] August 2021  . Colon polyps   . COVID-28 June 2019  . Endometriosis   . Fibromyalgia   . GERD with esophagitis    Follows with Dr. Fuller Plan with GI, EGD in the past  . IBS (irritable bowel syndrome)   . Major depressive disorder   . Migraine   . Mild intermittent asthma   . Mixed diabetic hyperlipidemia associated with type 2 diabetes mellitus ([place])   . Obesity   . Type 2 diabetes mellitus ([place])   . Urinary incontinence     Patient Active Problem List   Diagnosis Date Noted  . Non-ST elevation (NSTEMI) myocardial infarction ([place])   . Acute respiratory failure with hypoxia ([place])   . Benzodiazepine withdrawal with complication ([place])   . Severe sepsis ([place]) 12/12/2019  . Hyperammonemia ([place]) 12/12/2019  . Chest pain in adult 12/12/2019  . Nystagmus 12/11/2019  . Leukocytosis 12/11/2019  . Chronic combined systolic and diastolic congestive heart failure ([employer]) 12/11/2019    . Type 2 diabetes mellitus ([place]) 12/11/2019  . Acute combined systolic and diastolic congestive heart failure ([place]) 10/22/2019  . CAD S/P percutaneous coronary angioplasty 10/22/2019  . Aphasia 10/21/2019  . Elevated troponin   . Cardiomyopathy ([place])   . DOE (dyspnea on exertion) 10/16/2019  . Mild intermittent asthma with (acute) exacerbation 06/24/2019  . COVID-19 virus infection 06/24/2019  . Acute metabolic encephalopathy 62/56/3893  . Hypokalemia, inadequate intake 06/24/2019  . Hypomagnesemia 06/24/2019  . GERD with esophagitis   . Major depressive disorder   . Mixed diabetic hyperlipidemia associated with type 2 diabetes mellitus ([place])   . Chronic daily headache 12/07/2014  . Memory loss 08/31/2014  . Worsening headaches 08/31/2014  . Atypical chest pain 11/05/2013  . Anemia   . Anxiety   . Depression   . Diabetes mellitus type 2 in obese ([place])   . Urinary incontinence   . Endometriosis   . Asthma   . Fibromyalgia     Past Surgical History:  Procedure Laterality Date  . ABDOMINAL HYSTERECTOMY     TAH BSO  . APPENDECTOMY  1999  . CATARACT EXTRACTION     both  . CHOLECYSTECTOMY    . CORONARY STENT INTERVENTION N/A 10/21/2019   Procedure: CORONARY STENT INTERVENTION;  Surgeon: Troy Sine,  MD;  Location: [facility];  Service: Cardiovascular;  Laterality: N/A;  . DENTAL SURGERY    . HERNIA REPAIR  2000  . LAPAROSCOPIC ENDOMETRIOSIS FULGURATION    . RIGHT/LEFT HEART CATH AND CORONARY ANGIOGRAPHY N/A 10/21/2019   Procedure: RIGHT/LEFT HEART CATH AND CORONARY ANGIOGRAPHY;  Surgeon: Troy Sine, MD;  Location: [facility];  Service: Cardiovascular;  Laterality: N/A;     OB History    Gravida  1   Para  1   Term      Preterm      AB      Living  1     SAB      TAB      Ectopic      Multiple      Live Births              Family History  Problem Relation Age of Onset  . Hypertension Mother   . Heart failure Mother   .  Diabetes Mother   . Hypertension Father   . Heart failure Father   . Diabetes Father   . Heart attack Father   . Stroke Father   . Cancer Brother        lung  . Thyroid disease Brother   . Cancer Brother        throat  . Cancer Paternal Aunt        bone  . Cancer Paternal Uncle        lung  . Hypertension Maternal Grandmother   . Hypertension Maternal Grandfather   . Hypertension Paternal Grandmother   . Hypertension Paternal Grandfather   . Diabetes Daughter   . Colon cancer Neg Hx   . Stomach cancer Neg Hx     Social History   Tobacco Use  . Smoking status: Never Smoker  . Smokeless tobacco: Never Used  Substance Use Topics  . Alcohol use: Yes    Alcohol/week: 0.0 standard drinks    Comment: occasional  . Drug use: No    Home Medications Prior to Admission medications   Medication Sig Start Date End Date Taking? Authorizing Provider  Ascorbic Acid (VITAMIN C PO) Take 1 tablet by mouth daily.    [provider]  aspirin 81 MG tablet Take 81 mg by mouth daily.    [provider]  atorvastatin (LIPITOR) 80 MG tablet Take 1 tablet (80 mg total) by mouth at bedtime. 11/19/19 12/19/19  Martinique, Peter M, MD  bismuth subsalicylate (PEPTO BISMOL) 262 MG/15ML suspension Take 30 mLs by mouth every 6 (six) hours as needed for indigestion.    [provider]  DULoxetine (CYMBALTA) 60 MG capsule Take 60 mg by mouth daily. 08/10/19   [provider]  ergocalciferol (VITAMIN D2) 50000 UNITS capsule Take 50,000 Units by mouth once a week.      [provider]  fluticasone (FLONASE) 50 MCG/ACT nasal spray Place 1 spray into both nostrils daily as needed for allergies or rhinitis.  05/28/19   [provider]  Fluticasone-Salmeterol (ADVAIR DISKUS) 250-50 MCG/DOSE AEPB Inhale 1 puff into the lungs 2 (two) times daily.    [provider]  furosemide (LASIX) 40 MG tablet Take 1 tablet (40 mg total) by mouth daily. 12/17/19   Charlynne Cousins, MD  loratadine (CLARITIN) 10 MG tablet Take 1 tablet (10 mg total) by mouth daily. 06/28/19   Ghimire, Henreitta Leber, MD  LORazepam (ATIVAN) 1 MG tablet Take 1 tablet (  1 mg total) by mouth 2 (two) times daily. 12/18/19   Charlynne Cousins, MD  metoprolol succinate (TOPROL-XL) 50 MG 24 hr tablet Take 1 tablet (50 mg total) by mouth daily. Take with or immediately following a meal. 12/18/19   Charlynne Cousins, MD  NONFORMULARY OR COMPOUNDED ITEM Estradiol 0.02% vaginal cream prefilled applicators apply one twice weekly 05/23/15   Fontaine, Belinda Block, MD  ondansetron (ZOFRAN) 4 MG tablet Take 4 mg by mouth every 8 (eight) hours as needed for nausea or vomiting.  11/22/19   [provider]  Jonetta Speak LANCETS 29F MISC  06/03/16   [provider]  [facility] VERIO test strip  06/03/16   [provider]  oxyCODONE-acetaminophen (PERCOCET) 10-325 MG tablet Take 1 tablet by mouth 2 (two) times daily as needed for pain. 12/18/19   Charlynne Cousins, MD  pantoprazole (PROTONIX) 40 MG tablet Take 1 tablet (40 mg total) by mouth 2 (two) times daily. 08/14/11   Ladene Artist, MD  potassium chloride SA (KLOR-CON) 20 MEQ tablet Take 2 tablets (40 mEq total) by mouth daily. 11/19/19 12/19/19  Martinique, Peter M, MD  PROAIR HFA 108 [phone] Base) MCG/ACT inhaler Inhale 2 puffs into the lungs every 4 (four) hours as needed for wheezing or shortness of breath. Uses as rescue inhaler Reported on 06/20/2015 Patient taking differently: Inhale 2 puffs into the lungs every 4 (four) hours as needed for wheezing or shortness of breath. Uses as rescue inhaler Reported on 10/16/2019 06/28/19   Jonetta Osgood, MD  sacubitril-valsartan (ENTRESTO) 49-51 MG Take 1 tablet by mouth 2 (two) times daily. 12/17/19   Charlynne Cousins, MD  ticagrelor (BRILINTA) 90 MG TABS tablet Take 90 mg by mouth 2 (two) times daily.    [provider]    Allergies    Fish-derived products, Shellfish  allergy, Aspirin, Benadryl [diphenhydramine], Ivp dye [iodinated diagnostic agents], and Latex  Review of Systems   Review of Systems  Gastrointestinal: Positive for vomiting.  Neurological: Positive for dizziness and headaches.  All other systems reviewed and are negative.   Physical Exam Updated Vital Signs BP (!) 135/94   Pulse (!) 105   Temp 99 F (37.2 C) (Oral)   Resp 14   Ht 5\' 2"  (1.575 m)   Wt 65.8 kg   SpO2 98%   BMI 26.52 kg/m   Physical Exam Vitals and nursing note reviewed.  Constitutional:      Comments: Uncomfortable, nauseated   HENT:     Head: Normocephalic.     Mouth/Throat:     Mouth: Mucous membranes are moist.  Eyes:     Extraocular Movements: Extraocular movements intact.     Comments: Some vertical nystagmus.  Cardiovascular:     Rate and Rhythm: Normal rate and regular rhythm.     Heart sounds: Normal heart sounds.  Pulmonary:     Effort: Pulmonary effort is normal.     Breath sounds: Normal breath sounds.  Abdominal:     General: Bowel sounds are normal.     Comments: Mild epigastric tenderness  Musculoskeletal:        General: Normal range of motion.     Cervical back: Normal range of motion and neck supple.  Skin:    General: Skin is warm.  Neurological:     Mental Status: She is alert and oriented to person, place, and time.     Comments: Vertical nystagmus is noted.  Normal strength bilaterally.  Normal  sensation bilateral  Psychiatric:        Mood and Affect: Mood normal.        Behavior: Behavior normal.     ED Results / Procedures / Treatments   Labs (all labs ordered are listed, but only abnormal results are displayed) Labs Reviewed  CULTURE, BLOOD (ROUTINE X 2)  CULTURE, BLOOD (ROUTINE X 2)  URINE CULTURE  CBC WITH DIFFERENTIAL/PLATELET  COMPREHENSIVE METABOLIC PANEL  LACTIC ACID, PLASMA  LACTIC ACID, PLASMA  BRAIN NATRIURETIC PEPTIDE  I-STAT CHEM 8, ED  TROPONIN I (HIGH SENSITIVITY)     EKG None  Radiology No results found.  Procedures Procedures (including critical care time)  CRITICAL CARE Performed by: Wandra Arthurs   Total critical care time: 30 minutes  Critical care time was exclusive of separately billable procedures and treating other patients.  Critical care was necessary to treat or prevent imminent or life-threatening deterioration.  Critical care was time spent personally by me on the following activities: development of treatment plan with patient and/or surrogate as well as nursing, discussions with consultants, evaluation of patient's response to treatment, examination of patient, obtaining history from patient or surrogate, ordering and performing treatments and interventions, ordering and review of laboratory studies, ordering and review of radiographic studies, pulse oximetry and re-evaluation of patient's condition.   Medications Ordered in ED Medications  meclizine (ANTIVERT) tablet 25 mg (has no administration in time range)  ondansetron (ZOFRAN) injection 4 mg (has no administration in time range)    ED Course  I have reviewed the triage vital signs and the nursing notes.  Pertinent labs & imaging results that were available during my care of the patient were reviewed by me and considered in my medical decision making (see chart for details).    MDM Rules/Calculators/A&P                         AICHA CLINGENPEEL is a 74 y.o. female here presenting with nausea and vomiting and weakness.  Patient does have nystagmus on exam.  Patient had a complicated admission recently with possible bacteremia and NSTEMI.  Differential is very broad at this point.  Patient can be septic from UTI or pneumonia.  Also consider posterior circulation stroke versus peripheral vertigo.  Plan to get CT head and MRI brain and CT ab/pel, UA, lactate, cultures.   7:20 PM Patient has low-grade temp 100.4.  Patient's potassium is low at 3.6.  She is already on  potassium supplementation and has been vomiting.  White blood cell count is elevated at 14 and she has a UTI again.  Her CT head and MRI brain were unremarkable.  Given meclizine and felt slightly better.  Also given Zofran but still nauseated.  Ordered multiple potassium runs.  Also ordered Rocephin for UTI.  Will admit for sepsis from UTI and hypokalemia    Final Clinical Impression(s) / ED Diagnoses Final diagnoses:  None    Rx / DC Orders ED Discharge Orders    None       Drenda Freeze, MD 01/10/20 1921

## 2020-01-10 NOTE — Telephone Encounter (Signed)
Patient not feeling well Friday night. Blood pressure was really high. Been throwing up /nauseous. Can't keep food down. Please call daughter

## 2020-01-10 NOTE — ED Notes (Signed)
Patient transported to CT 

## 2020-01-11 DIAGNOSIS — F329 Major depressive disorder, single episode, unspecified: Secondary | ICD-10-CM

## 2020-01-11 DIAGNOSIS — N390 Urinary tract infection, site not specified: Secondary | ICD-10-CM

## 2020-01-11 DIAGNOSIS — F419 Anxiety disorder, unspecified: Secondary | ICD-10-CM

## 2020-01-11 DIAGNOSIS — F32A Depression, unspecified: Secondary | ICD-10-CM

## 2020-01-11 DIAGNOSIS — K2100 Gastro-esophageal reflux disease with esophagitis, without bleeding: Secondary | ICD-10-CM

## 2020-01-11 DIAGNOSIS — A419 Sepsis, unspecified organism: Secondary | ICD-10-CM

## 2020-01-11 DIAGNOSIS — J45909 Unspecified asthma, uncomplicated: Secondary | ICD-10-CM

## 2020-01-11 LAB — TSH: TSH: 0.945 u[IU]/mL (ref 0.350–4.500)

## 2020-01-11 LAB — COMPREHENSIVE METABOLIC PANEL
ALT: 12 U/L (ref 0–44)
AST: 12 U/L — ABNORMAL LOW (ref 15–41)
Albumin: 3.1 g/dL — ABNORMAL LOW (ref 3.5–5.0)
Alkaline Phosphatase: 57 U/L (ref 38–126)
Anion gap: 10 (ref 5–15)
BUN: 24 mg/dL — ABNORMAL HIGH (ref 8–23)
CO2: 26 mmol/L (ref 22–32)
Calcium: 7.6 mg/dL — ABNORMAL LOW (ref 8.9–10.3)
Chloride: 101 mmol/L (ref 98–111)
Creatinine, Ser: 0.99 mg/dL (ref 0.44–1.00)
GFR, Estimated: >60 mL/min (ref >60)
Glucose, Bld: 103 mg/dL — ABNORMAL HIGH (ref 70–99)
Potassium: 3 mmol/L — ABNORMAL LOW (ref 3.5–5.1)
Sodium: 137 mmol/L (ref 135–145)
Total Bilirubin: 1.3 mg/dL — ABNORMAL HIGH (ref 0.3–1.2)
Total Protein: 6.2 g/dL — ABNORMAL LOW (ref 6.5–8.1)

## 2020-01-11 LAB — PROTIME-INR
INR: 1.1 (ref 0.8–1.2)
Prothrombin Time: 13.5 seconds (ref 11.4–15.2)

## 2020-01-11 LAB — CBC
HCT: 32.6 % — ABNORMAL LOW (ref 36.0–46.0)
Hemoglobin: 10.9 g/dL — ABNORMAL LOW (ref 12.0–15.0)
MCH: 30.3 pg (ref 26.0–34.0)
MCHC: 33.4 g/dL (ref 30.0–36.0)
MCV: 90.6 fL (ref 80.0–100.0)
Platelets: 229 10*3/uL (ref 150–400)
RBC: 3.6 MIL/uL — ABNORMAL LOW (ref 3.87–5.11)
RDW: 14.2 % (ref 11.5–15.5)
WBC: 8.8 10*3/uL (ref 4.0–10.5)
nRBC: 0 % (ref 0.0–0.2)

## 2020-01-11 LAB — URINE CULTURE

## 2020-01-11 LAB — MAGNESIUM: Magnesium: 1.2 mg/dL — ABNORMAL LOW (ref 1.7–2.4)

## 2020-01-11 LAB — PROCALCITONIN: Procalcitonin: <0.1 ng/mL

## 2020-01-11 LAB — CORTISOL-AM, BLOOD: Cortisol - AM: 5 ug/dL — ABNORMAL LOW (ref 6.7–22.6)

## 2020-01-11 MED ORDER — ADULT MULTIVITAMIN W/MINERALS CH
1.0000 | ORAL_TABLET | Freq: Every day | ORAL | Status: DC
  Administered 2020-01-11 – 2020-01-15 (×5): 1 via ORAL
  Filled 2020-01-11 (×5): qty 1

## 2020-01-11 MED ORDER — DEXTROSE IN LACTATED RINGERS 5 % IV SOLN: INTRAVENOUS | Status: DC

## 2020-01-11 MED ORDER — ORAL CARE MOUTH RINSE
15.0000 mL | Freq: Two times a day (BID) | OROMUCOSAL | Status: DC
  Administered 2020-01-11 – 2020-01-15 (×8): 15 mL via OROMUCOSAL

## 2020-01-11 MED ORDER — POTASSIUM CHLORIDE 20 MEQ PO PACK
40.0000 meq | PACK | ORAL | Status: AC
  Administered 2020-01-11 (×2): 40 meq via ORAL
  Filled 2020-01-11 (×3): qty 2

## 2020-01-11 MED ORDER — MAGNESIUM SULFATE 4 GM/100ML IV SOLN
4.0000 g | Freq: Once | INTRAVENOUS | Status: AC
  Administered 2020-01-11: 4 g via INTRAVENOUS
  Filled 2020-01-11: qty 100

## 2020-01-11 MED ORDER — MAGNESIUM SULFATE 2 GM/50ML IV SOLN: 2.0000 g | Freq: Once | INTRAVENOUS | Status: DC

## 2020-01-11 MED ORDER — POTASSIUM CHLORIDE CRYS ER 20 MEQ PO TBCR: 40.0000 meq | EXTENDED_RELEASE_TABLET | ORAL | Status: DC

## 2020-01-11 MED ORDER — POTASSIUM CHLORIDE 10 MEQ/100ML IV SOLN
INTRAVENOUS | Status: AC
  Administered 2020-01-11: 10 meq
  Filled 2020-01-11: qty 100

## 2020-01-11 MED ORDER — ENOXAPARIN SODIUM 40 MG/0.4ML ~~LOC~~ SOLN
40.0000 mg | SUBCUTANEOUS | Status: DC
  Administered 2020-01-11 – 2020-01-14 (×4): 40 mg via SUBCUTANEOUS
  Filled 2020-01-11 (×4): qty 0.4

## 2020-01-11 NOTE — Progress Notes (Signed)
PROGRESS NOTE    Nicole [name]  ZHG:[id number] DOB: [birth year] DOA: 01/10/2020 PCP: [name], MD    Brief Narrative:  Nicole [name] was admitted to the hospital working diagnosis of sepsis due to urinary tract infection. (Present on admission)  74 year old female past medical history for type 2 diabetes mellitus, dyslipidemia, heart failure, coronary disease, GERD, irritable bowel syndrome and fibromyalgia who presents with weakness and dizziness.  Patient has been recently discharged from skilled nursing facility to home about 7 days ago.  Over the last 3 days prior to hospitalization patient developed dizziness, weakness, nausea and vomiting.  Poor p.o. intake, unable to take her medications at home.  She noticed decreased urine output.  On her initial physical examination she was febrile, 38 C, blood pressure 129/87, heart rate 104, respiratory rate 16, oxygen saturation 99%, lungs are clear to auscultation bilaterally, heart S1-S2, tachycardic, no gallops, abdomen soft, no lower extremity edema. Sodium 138, potassium 2.6, chloride 95, bicarb 27, glucose 123, BUN 23, creatinine 0.89, white count 13.9, hemoglobin 14.6, hematocrit 42.5, platelets 325.  Troponin I 19-19, SARS COVID-19 negative.  Urinalysis specific gravity 1.025, 6-10 red cells, more than 50 white cells.  Assessment & Plan:   Principal Problem:   Sepsis secondary to UTI [facility]) Active Problems:   Anxiety   Depression   Diabetes mellitus type 2 in obese (HCC)   Asthma   Fibromyalgia   GERD with esophagitis   Major depressive disorder   1. Sepsis due to urine infection, present on admission. Systolic blood pressure 196 mmHg this am, with HR at 88 bpm. Wbc is 8,8, cultures continue with no growth.   Continue antibiotic therapy with ceftriaxone, follow up on cultures, cell count and temperature curve.   2. Hypokalemia and hypomagnesemia. K is down to 3,0 from 2.9 and Mg at 1,2 from 0,3. Continue aggressive  electrolyte correction with oral kcl 80 meq in 2 divided doses and 4 g of Mg sulfate IV  Patient with poor oral intake, add gentle hydration with balanced electrolyte solutions with LR, follow up on renal function and electrolytes in am.   3. COPD. No signs of acute exacerbation, continue with albuterol, fluticasone and vilanterol.   4. Anxiety/ Continue with lorazepam and duloxetine.   5. Systolic heart failure. Continue entresto, metoprolol and ticagrelor.    Patient continue to be at high risk for worsening sepsis and electrolyte disturbances.   Status is: Inpatient  Remains inpatient appropriate because:IV treatments appropriate due to intensity of illness or inability to take PO   Dispo: The patient is from: Home              Anticipated d/c is to: Home              Anticipated d/c date is: 3 days              Patient currently is not medically stable to d/c.   DVT prophylaxis: Enoxaparin   Code Status:   full  Family Communication:  I spoke with patient's daughter at the bedside, we talked in detail about patient's condition, plan of care and prognosis and all questions were addressed.      Nutrition Status: Nutrition Problem: Inadequate oral intake Etiology: nausea, vomiting, poor appetite Signs/Symptoms: per patient/family report Interventions: Ensure Enlive (each supplement provides 350kcal and 20 grams of protein), Magic cup      Antimicrobials:   Ceftriaxone     Subjective: Patient is tolerating po well, no nausea or vomiting,  continue to be very weak and deconditioned, positive dizziness and blurry vision with movement.   Objective: Vitals:   01/11/20 0031 01/11/20 0515 01/11/20 0729 01/11/20 0900  BP: 115/63 123/73  128/80  Pulse: 86 88  88  Resp: 14 20  18   Temp: 98 F (36.7 C) 97.8 F (36.6 C)  97.9 F (36.6 C)  TempSrc: Oral Oral  Oral  SpO2:  99% 95% 97%  Weight:      Height:       No intake or output data in the 24 hours ending 01/11/20  1418 Filed Weights   01/10/20 1503 01/10/20 2119  Weight: 65.8 kg 63.3 kg    Examination:   General: Not in pain or dyspnea, deconditioned  Neurology: Awake and alert, non focal  E ENT: positive pallor, no icterus, oral mucosa moist Cardiovascular: No JVD. S1-S2 present, rhythmic, no gallops, rubs, or murmurs. No lower extremity edema. Pulmonary: positive breath sounds bilaterally,with rhonchi or rales. Gastrointestinal. Abdomen soft and non tender Skin. No rashes Musculoskeletal: no joint deformities     Data Reviewed: I have personally reviewed following labs and imaging studies  CBC: Recent Labs  Lab 01/10/20 1636 01/10/20 1655 01/11/20 0515  WBC 13.9*  --  8.8  NEUTROABS 10.0*  --   --   HGB 14.6 15.0 10.9*  HCT 42.5 44.0 32.6*  MCV 88.4  --  90.6  PLT 325  --  132   Basic Metabolic Panel: Recent Labs  Lab 01/10/20 1636 01/10/20 1655 01/10/20 1845 01/11/20 0515  NA 138 139  --  137  K 2.6* 2.9*  --  3.0*  CL 95* 93*  --  101  CO2 27  --   --  26  GLUCOSE 123* 123*  --  103*  BUN 23 26*  --  24*  CREATININE 0.89 0.80  --  0.99  CALCIUM 8.4*  --   --  7.6*  MG  --   --  0.3* 1.2*   GFR: Estimated Creatinine Clearance: 44.9 mL/min (by C-G formula based on SCr of 0.99 mg/dL). Liver Function Tests: Recent Labs  Lab 01/10/20 1636 01/11/20 0515  AST 16 12*  ALT 16 12  ALKPHOS 76 57  BILITOT 1.2 1.3*  PROT 8.3* 6.2*  ALBUMIN 4.1 3.1*   No results for input(s): LIPASE, AMYLASE in the last 168 hours. No results for input(s): AMMONIA in the last 168 hours. Coagulation Profile: Recent Labs  Lab 01/11/20 0515  INR 1.1   Cardiac Enzymes: No results for input(s): CKTOTAL, CKMB, CKMBINDEX, TROPONINI in the last 168 hours. BNP (last 3 results) No results for input(s): PROBNP in the last 8760 hours. HbA1C: No results for input(s): HGBA1C in the last 72 hours. CBG: No results for input(s): GLUCAP in the last 168 hours. Lipid Profile: No results for  input(s): CHOL, HDL, LDLCALC, TRIG, CHOLHDL, LDLDIRECT in the last 72 hours. Thyroid Function Tests: Recent Labs    01/11/20 0515  TSH 0.945   Anemia Panel: No results for input(s): VITAMINB12, FOLATE, FERRITIN, TIBC, IRON, RETICCTPCT in the last 72 hours.    Radiology Studies: I have reviewed all of the imaging during this hospital visit personally     Scheduled Meds: . aspirin EC  81 mg Oral Daily  . diphenhydrAMINE  12.5 mg Intravenous Once  . DULoxetine  60 mg Oral Daily  . feeding supplement  237 mL Oral BID BM  . fluticasone furoate-vilanterol  1 puff Inhalation Daily  . heparin  5,000 Units Subcutaneous Q8H  . loratadine  10 mg Oral Daily  . LORazepam  1 mg Oral BID  . mouth rinse  15 mL Mouth Rinse BID  . metoprolol succinate  50 mg Oral Q breakfast  . multivitamin with minerals  1 tablet Oral Daily  . pantoprazole  40 mg Oral BID  . sacubitril-valsartan  1 tablet Oral BID  . ticagrelor  90 mg Oral BID  . Vitamin D (Ergocalciferol)  50,000 Units Oral Weekly   Continuous Infusions: . cefTRIAXone (ROCEPHIN)  IV 1 g (01/11/20 1002)     LOS: 1 day        Laquita Harlan Gerome Apley, MD

## 2020-01-11 NOTE — Progress Notes (Signed)
Initial Nutrition Assessment  INTERVENTION:   -Ensure Enlive po BID, each supplement provides 350 kcal and 20 grams of protein -Magic cup BID with meals, each supplement provides 290 kcal and 9 grams of protein  NUTRITION DIAGNOSIS:   Inadequate oral intake related to nausea, vomiting, poor appetite as evidenced by per patient/family report.  GOAL:   Patient will meet greater than or equal to 90% of their needs  MONITOR:   PO intake, Supplement acceptance, Labs, Weight trends, I & O's  REASON FOR ASSESSMENT:   Malnutrition Screening Tool    ASSESSMENT:   74 y.o. female, with hx of T2DM, HLD, CHF, CAD, Fibromyalgia, IBS, GERD, and more presents to the ED with weakness and dizziness. Patient was hospitalized a month ago for sepsis 2/2 pneumonia, NSTEMI, and hypokalemia. She had been vomiting prior to that hospitalization as well. She was discharged to rehab, was in rehab for 2 weeks, and returned home 1 week ago. 3 days ago patient started to complain of dizziness, and weakness. 2 days ago patient started having nausea and vomiting every time she tried to ingest liquids or solids.Admission requested for further management of electrolyte disturbances and sepsis 2/2 UTI.  Patient with poor appetite and PO for 4 days PTA d/t N/V. Pt with electrolyte disturbances, being repleted. Will add daily MVI as well. Ensure supplements have been ordered. Will order Magic cups with meals as well.  Per weight records, pt has lost 15 lbs since 8/12 (9% wt loss x 2.5 months, significant for time frame).   Medications: Vitamin D weekly, Phenergan, KCl  Labs reviewed: Low K, Mg  NUTRITION - FOCUSED PHYSICAL EXAM:  deferred  Diet Order:   Diet Order            Diet Heart Room service appropriate? Yes; Fluid consistency: Thin  Diet effective now                 EDUCATION NEEDS:   No education needs have been identified at this time  Skin:  Skin Assessment: Reviewed RN  Assessment  Last BM:  10/31  Height:   Ht Readings from Last 1 Encounters:  01/10/20 5\' 2"  (1.575 m)    Weight:   Wt Readings from Last 1 Encounters:  01/10/20 63.3 kg   BMI:  Body mass index is 25.52 kg/m.  Estimated Nutritional Needs:   Kcal:  1600-1800  Protein:  75-85g  Fluid:  1.8L/day  Clayton Bibles, MS, RD, LDN Inpatient Clinical Dietitian Contact information available via Amion

## 2020-01-11 NOTE — Progress Notes (Signed)
PT Cancellation Note  Patient Details Name: TAMIEKA RANCOURT MRN: [id number] DOB: 02-12-1946   Cancelled Treatment:    Reason Eval/Treat Not Completed: Medical issues which prohibited therapy (pt reports severe dizziness with mobility and stated she cannot tolerate activity at present. Will follow.) She reports she was ambulating at home holding on to furniture prior to this admission.   Blondell Reveal Kistler PT 01/11/2020  Acute Rehabilitation Services Pager [phone] Office [phone]

## 2020-01-12 DIAGNOSIS — E1169 Type 2 diabetes mellitus with other specified complication: Secondary | ICD-10-CM

## 2020-01-12 DIAGNOSIS — E669 Obesity, unspecified: Secondary | ICD-10-CM

## 2020-01-12 DIAGNOSIS — M797 Fibromyalgia: Secondary | ICD-10-CM

## 2020-01-12 LAB — CBC WITH DIFFERENTIAL/PLATELET
Abs Immature Granulocytes: 0.1 10*3/uL — ABNORMAL HIGH (ref 0.00–0.07)
Basophils Absolute: 0.1 10*3/uL (ref 0.0–0.1)
Basophils Relative: 1 %
Eosinophils Absolute: 0.3 10*3/uL (ref 0.0–0.5)
Eosinophils Relative: 3 %
HCT: 36.2 % (ref 36.0–46.0)
Hemoglobin: 12.2 g/dL (ref 12.0–15.0)
Immature Granulocytes: 1 %
Lymphocytes Relative: 15 %
Lymphs Abs: 1.5 10*3/uL (ref 0.7–4.0)
MCH: 30.3 pg (ref 26.0–34.0)
MCHC: 33.7 g/dL (ref 30.0–36.0)
MCV: 89.8 fL (ref 80.0–100.0)
Monocytes Absolute: 0.5 10*3/uL (ref 0.1–1.0)
Monocytes Relative: 6 %
Neutro Abs: 7.3 10*3/uL (ref 1.7–7.7)
Neutrophils Relative %: 74 %
Platelets: 260 10*3/uL (ref 150–400)
RBC: 4.03 MIL/uL (ref 3.87–5.11)
RDW: 14.4 % (ref 11.5–15.5)
WBC: 9.8 10*3/uL (ref 4.0–10.5)
nRBC: 0 % (ref 0.0–0.2)

## 2020-01-12 LAB — BASIC METABOLIC PANEL WITH GFR
Anion gap: 10 (ref 5–15)
BUN: 8 mg/dL (ref 8–23)
CO2: 21 mmol/L — ABNORMAL LOW (ref 22–32)
Calcium: 8.6 mg/dL — ABNORMAL LOW (ref 8.9–10.3)
Chloride: 107 mmol/L (ref 98–111)
Creatinine, Ser: 0.59 mg/dL (ref 0.44–1.00)
GFR, Estimated: >60 mL/min
Glucose, Bld: 159 mg/dL — ABNORMAL HIGH (ref 70–99)
Potassium: 3.6 mmol/L (ref 3.5–5.1)
Sodium: 138 mmol/L (ref 135–145)

## 2020-01-12 MED ORDER — MECLIZINE HCL 25 MG PO TABS: 25.0000 mg | ORAL_TABLET | Freq: Three times a day (TID) | ORAL | Status: DC | PRN

## 2020-01-12 MED ORDER — TRAZODONE HCL 50 MG PO TABS
50.0000 mg | ORAL_TABLET | Freq: Every day | ORAL | Status: DC
  Administered 2020-01-12 – 2020-01-14 (×3): 50 mg via ORAL
  Filled 2020-01-12 (×3): qty 1

## 2020-01-12 MED ORDER — MECLIZINE HCL 25 MG PO TABS
25.0000 mg | ORAL_TABLET | Freq: Three times a day (TID) | ORAL | Status: DC | PRN
  Administered 2020-01-12 – 2020-01-14 (×5): 25 mg via ORAL
  Filled 2020-01-12 (×5): qty 1

## 2020-01-12 MED ORDER — POTASSIUM CHLORIDE 20 MEQ PO PACK
40.0000 meq | PACK | Freq: Once | ORAL | Status: AC
  Administered 2020-01-12: 40 meq via ORAL
  Filled 2020-01-12: qty 2

## 2020-01-12 MED ORDER — POTASSIUM CHLORIDE CRYS ER 20 MEQ PO TBCR: 40.0000 meq | EXTENDED_RELEASE_TABLET | Freq: Once | ORAL | Status: DC

## 2020-01-12 NOTE — Evaluation (Signed)
Physical Therapy Evaluation Patient Details Name: Nicole [name] MRN: [id number] DOB: [birth year] Today's Date: 01/12/2020   History of Present Illness  74 y.o. female, with hx of T2DM, HLD, CHF, CAD, Fibromyalgia, IBS, GERD,  presents to the ED with weakness and dizziness. Patient was hospitalized a month ago for sepsis 2/2 pneumonia, NSTEMI, and hypokalemia. She was [facility] where she had 2 weeks of rehab, was then home for a week. Now admitted with nausea, vomiting, weakness, paresthesias, head ache. Dx of sepsis 2* UTI, hypokalemia.  Clinical Impression  Pt admitted with above diagnosis. Pt ambulated 180' with RW with min to mod assist for balance, she is unsteady and a high fall risk. Hands on assist recommended for ambulation, pt's daughter present and stated she can provide this at home. Pt reports dizziness, diplopia, nystagmus noted at rest, vestibular evaluation recommended, I will arrange to have a colleague perform this.  Pt currently with functional limitations due to the deficits listed below (see PT Problem List). Pt will benefit from skilled PT to increase their independence and safety with mobility to allow discharge to the venue listed below.       Follow Up Recommendations Home health PT; assistance for mobility    Equipment Recommendations  3in1 (PT);Other (comment) (rollator)    Recommendations for Other Services       Precautions / Restrictions Precautions Precautions: Fall Precaution Comments: daughter reports 1 fall ~1 month ago, pt tripped on a root in the dark. Pt now unsteady/dizzy and is a high fall risk. Restrictions Weight Bearing Restrictions: No      Mobility  Bed Mobility               General bed mobility comments: sitting at edge of bed    Transfers Overall transfer level: Needs assistance   Transfers: Sit to/from Stand Sit to Stand: Min assist         General transfer comment: VCs hand placement, min A to  steady  Ambulation/Gait Ambulation/Gait assistance: Min assist Gait Distance (Feet): 180 Feet Assistive device: Rolling walker (2 wheeled) Gait Pattern/deviations: Step-through pattern;Shuffle;Decreased stride length;Staggering left;Staggering right Gait velocity: WFL   General Gait Details: unsteady, min A for balance, mod assist for balance with turns  Stairs            Wheelchair Mobility    Modified Rankin (Stroke Patients Only)       Balance Overall balance assessment: Needs assistance Sitting-balance support: Feet supported;No upper extremity supported Sitting balance-Leahy Scale: Good Sitting balance - Comments: nystagmus noted at rest   Standing balance support: Bilateral upper extremity supported Standing balance-Leahy Scale: Poor Standing balance comment: relies on BUE support                             Pertinent Vitals/Pain Pain Assessment: No/denies pain    Home Living Family/patient expects to be discharged to:: Private residence Living Arrangements: Children Available Help at Discharge: Family;Available 24 hours/day Type of Home: House Home Access: Ramped entrance     Home Layout: One level Home Equipment: None      Prior Function Level of Independence: Needs assistance   Gait / Transfers Assistance Needed: independent until ~1 month ago,  now furniture walking     Comments: prior to recent admission early October, pt was independent/driving. Since that admission and DC from SNF pt has been furniture walking, they were waiting on [facility] to order/deliver a rollator.  Hand Dominance        Extremity/Trunk Assessment   Upper Extremity Assessment Upper Extremity Assessment: Overall WFL for tasks assessed    Lower Extremity Assessment Lower Extremity Assessment: Overall WFL for tasks assessed    Cervical / Trunk Assessment Cervical / Trunk Assessment: Normal  Communication   Communication: No difficulties  Cognition  Arousal/Alertness: Awake/alert Behavior During Therapy: WFL for tasks assessed/performed Overall Cognitive Status: Impaired/Different from baseline Area of Impairment: Orientation;Safety/judgement;Awareness;Memory                 Orientation Level: Place;Situation;Time   Memory: Decreased short-term memory   Safety/Judgement: Decreased awareness of safety;Decreased awareness of deficits     General Comments: knows month/year, does not know location, cannot state name of president, reported seeing 2 men in the hallway that weren't there then said she meant "chairs not men" (there were chairs in the hallway), she also began pouring milk into her cup of water      General Comments General comments (skin integrity, edema, etc.): vestibular eval recommended.    Exercises     Assessment/Plan    PT Assessment Patient needs continued PT services  PT Problem List Decreased activity tolerance;Decreased balance;Decreased knowledge of use of DME;Decreased cognition;Decreased mobility       PT Treatment Interventions Gait training;Therapeutic activities;Therapeutic exercise;Balance training;Patient/family education;Functional mobility training    PT Goals (Current goals can be found in the Care Plan section)  Acute Rehab PT Goals Patient Stated Goal: improve balance/dizziness PT Goal Formulation: With patient/family Time For Goal Achievement: 01/26/20 Potential to Achieve Goals: Good    Frequency Min 3X/week   Barriers to discharge        Co-evaluation               AM-PAC PT "6 Clicks" Mobility  Outcome Measure Help needed turning from your back to your side while in a flat bed without using bedrails?: A Little Help needed moving from lying on your back to sitting on the side of a flat bed without using bedrails?: A Little Help needed moving to and from a bed to a chair (including a wheelchair)?: A Little Help needed standing up from a chair using your arms (e.g.,  wheelchair or bedside chair)?: A Little Help needed to walk in hospital room?: A Lot Help needed climbing 3-5 steps with a railing? : A Little 6 Click Score: 17    End of Session Equipment Utilized During Treatment: Gait belt Activity Tolerance: Patient tolerated treatment well Patient left: in chair;with call bell/phone within reach;with chair alarm set;with family/visitor present Nurse Communication: Mobility status PT Visit Diagnosis: Difficulty in walking, not elsewhere classified (R26.2);History of falling (Z91.81);Dizziness and giddiness (R42)    Time: 3570-1779 PT Time Calculation (min) (ACUTE ONLY): 23 min   Charges:   PT Evaluation $PT Eval Moderate Complexity: 1 Mod PT Treatments $Gait Training: 8-22 mins       Blondell Reveal Kistler PT 01/12/2020  Acute Rehabilitation Services Pager [phone] Office [phone]

## 2020-01-12 NOTE — TOC Progression Note (Signed)
Transition of Care [facility]) - Progression Note    Patient Details  Name: Nicole [name] MRN: [id number] Date of Birth: 05-Jul-1945  Transition of Care [facility]) CM/SW Contact  Purcell Mouton, RN Phone Number: 01/12/2020, 11:20 AM  Clinical Narrative:     Spoke with pt and daughter concerning DME and HH. Pt asked CM to speak to  With her daughter [name]. [name] states that [facility] is already setup. DME is from [employer].       Expected Discharge Plan and Services                                                 Social Determinants of Health (SDOH) Interventions    Readmission Risk Interventions Readmission Risk Prevention Plan 10/22/2019  Transportation Screening (No Data)  PCP or Specialist Appt within 3-5 Days Complete  HRI or Home Care Consult Complete  Social Work Consult for [place] Planning/Counseling Complete  Palliative Care Screening Not Applicable  Medication Review [employer]) Complete  Some recent data might be hidden

## 2020-01-12 NOTE — Telephone Encounter (Signed)
I am covering Dr. Doug Sou box this week.  Agree with plan.

## 2020-01-12 NOTE — Progress Notes (Signed)
PROGRESS NOTE    LYNCOLN MASKELL  YBO:[id number] DOB: 04-10-1945 DOA: 01/10/2020 PCP: [name], MD    Brief Narrative:  [name] was admitted to the hospital working diagnosis of sepsis due to urinary tract infection. (Present on admission)  74 year old female past medical history for type 2 diabetes mellitus, dyslipidemia, heart failure, coronary disease, GERD, irritable bowel syndrome and fibromyalgia who presents with weakness and dizziness.  Patient has been recently discharged from skilled nursing facility to home about 7 days ago.  Over the last 3 days prior to hospitalization patient developed dizziness, weakness, nausea and vomiting.  Poor p.o. intake, unable to take her medications at home.  She noticed decreased urine output.  On her initial physical examination she was febrile, 38 C, blood pressure 129/87, heart rate 104, respiratory rate 16, oxygen saturation 99%, lungs are clear to auscultation bilaterally, heart S1-S2, tachycardic, no gallops, abdomen soft, no lower extremity edema. Sodium 138, potassium 2.6, chloride 95, bicarb 27, glucose 123, BUN 23, creatinine 0.89, white count 13.9, hemoglobin 14.6, hematocrit 42.5, platelets 325.  Troponin I 19-19, SARS COVID-19 negative.  Urinalysis specific gravity 1.025, 6-10 red cells, more than 50 white cells.   Assessment & Plan:   Principal Problem:   Sepsis secondary to UTI [facility]) Active Problems:   Anxiety   Depression   Diabetes mellitus type 2 in obese (HCC)   Asthma   Fibromyalgia   GERD with esophagitis   Major depressive disorder   1. Sepsis due to urine infection, present on admission. Her blood pressure continue to be stable with systolic 277 mmHg. Wbc is 9,8. Cultures continue with no growth.   Tolerating well antibiotic therapy with ceftriaxone.  2. Hypokalemia and hypomagnesemia. K this am up to 3,6, renal function stable with serum cr at 0.59.  Add 40 meq Kcl and follow up electrolytes in am,  including Mg.   Continue gentle hydration with balanced electrolyte solutions for 24 H more.   3. COPD/ Asthma. No clinical signs of acute exacerbation, on albuterol, fluticasone and vilanterol.   4. Anxiety/ insomnia/ fibromyalgia.  lorazepam and duloxetine. Add trazodone at night for sleep.  Continue with as needed tramadol.   5. Systolic heart failure. Tolerating well entresto, metoprolol and ticagrelor.   6. Positive vertigo. Continue PT and OT, hydration with IV fluids and correction of electrolytes.  Trial of meclizine as needed.   7. T2DM. Fasting glucose this am is 159, patient tolerating po well, continue to hold on insulin for now.      Patient continue to be at high risk for worsening electrolyte disturbances and urine infection   Status is: Inpatient  Remains inpatient appropriate because:IV treatments appropriate due to intensity of illness or inability to take PO   Dispo: The patient is from: Home              Anticipated d/c is to: Home              Anticipated d/c date is: 3 days              Patient currently is not medically stable to d/c. Ok to change level of care to medical ward, discontinue telemetry,     DVT prophylaxis: Enoxaparin   Code Status:   full  Family Communication:  I spoke with patient's daughter at the bedside, we talked in detail about patient's condition, plan of care and prognosis and all questions were addressed.      Nutrition Status: Nutrition Problem:  Inadequate oral intake Etiology: nausea, vomiting, poor appetite Signs/Symptoms: per patient/family report Interventions: Ensure Enlive (each supplement provides 350kcal and 20 grams of protein), Magic cup       Antimicrobials:   Ceftriaxone     Subjective: Patient is out of bed to chair, continue to have vertigo and nausea, no vomiting, no chest pain or dyspnea. Only slept 3 h over last 24 h, positive visual disturbances with double vision and vertigo.    Objective: Vitals:   01/11/20 1954 01/12/20 0601 01/12/20 0819 01/12/20 0855  BP: (!) 120/53 136/79  (!) 109/91  Pulse: 88 89  83  Resp: 16 16    Temp: 98.5 F (36.9 C) 98.2 F (36.8 C)    TempSrc: Oral Oral    SpO2: 97% 98% 99%   Weight:      Height:        Intake/Output Summary (Last 24 hours) at 01/12/2020 0910 Last data filed at 01/12/2020 0450 Gross per 24 hour  Intake 240 ml  Output --  Net 240 ml   Filed Weights   01/10/20 1503 01/10/20 2119  Weight: 65.8 kg 63.3 kg    Examination:   General: Not in pain or dyspnea  Neurology: Awake and alert, non focal. Positive nystagmus.   E ENT: no pallor, no icterus, oral mucosa moist Cardiovascular: No JVD. S1-S2 present, rhythmic, no gallops, rubs, or murmurs. No lower extremity edema. Pulmonary: positive breath sounds bilaterally, adequate air movement, no wheezing, rhonchi or rales. Gastrointestinal. Abdomen soft and non tender Skin. No rashes Musculoskeletal: no joint deformities     Data Reviewed: I have personally reviewed following labs and imaging studies  CBC: Recent Labs  Lab 01/10/20 1636 01/10/20 1655 01/11/20 0515 01/12/20 0537  WBC 13.9*  --  8.8 9.8  NEUTROABS 10.0*  --   --  7.3  HGB 14.6 15.0 10.9* 12.2  HCT 42.5 44.0 32.6* 36.2  MCV 88.4  --  90.6 89.8  PLT 325  --  229 102   Basic Metabolic Panel: Recent Labs  Lab 01/10/20 1636 01/10/20 1655 01/10/20 1845 01/11/20 0515 01/12/20 0537  NA 138 139  --  137 138  K 2.6* 2.9*  --  3.0* 3.6  CL 95* 93*  --  101 107  CO2 27  --   --  26 21*  GLUCOSE 123* 123*  --  103* 159*  BUN 23 26*  --  24* 8  CREATININE 0.89 0.80  --  0.99 0.59  CALCIUM 8.4*  --   --  7.6* 8.6*  MG  --   --  0.3* 1.2*  --    GFR: Estimated Creatinine Clearance: 55.6 mL/min (by C-G formula based on SCr of 0.59 mg/dL). Liver Function Tests: Recent Labs  Lab 01/10/20 1636 01/11/20 0515  AST 16 12*  ALT 16 12  ALKPHOS 76 57  BILITOT 1.2 1.3*  PROT 8.3*  6.2*  ALBUMIN 4.1 3.1*   No results for input(s): LIPASE, AMYLASE in the last 168 hours. No results for input(s): AMMONIA in the last 168 hours. Coagulation Profile: Recent Labs  Lab 01/11/20 0515  INR 1.1   Cardiac Enzymes: No results for input(s): CKTOTAL, CKMB, CKMBINDEX, TROPONINI in the last 168 hours. BNP (last 3 results) No results for input(s): PROBNP in the last 8760 hours. HbA1C: No results for input(s): HGBA1C in the last 72 hours. CBG: No results for input(s): GLUCAP in the last 168 hours. Lipid Profile: No results for input(s): CHOL, HDL,  LDLCALC, TRIG, CHOLHDL, LDLDIRECT in the last 72 hours. Thyroid Function Tests: Recent Labs    01/11/20 0515  TSH 0.945   Anemia Panel: No results for input(s): VITAMINB12, FOLATE, FERRITIN, TIBC, IRON, RETICCTPCT in the last 72 hours.    Radiology Studies: I have reviewed all of the imaging during this hospital visit personally     Scheduled Meds: . aspirin EC  81 mg Oral Daily  . diphenhydrAMINE  12.5 mg Intravenous Once  . DULoxetine  60 mg Oral Daily  . enoxaparin (LOVENOX) injection  40 mg Subcutaneous Q24H  . feeding supplement  237 mL Oral BID BM  . fluticasone furoate-vilanterol  1 puff Inhalation Daily  . loratadine  10 mg Oral Daily  . LORazepam  1 mg Oral BID  . mouth rinse  15 mL Mouth Rinse BID  . metoprolol succinate  50 mg Oral Q breakfast  . multivitamin with minerals  1 tablet Oral Daily  . pantoprazole  40 mg Oral BID  . sacubitril-valsartan  1 tablet Oral BID  . ticagrelor  90 mg Oral BID  . Vitamin D (Ergocalciferol)  50,000 Units Oral Weekly   Continuous Infusions: . cefTRIAXone (ROCEPHIN)  IV 1 g (01/11/20 1002)  . dextrose 5% lactated ringers 50 mL/hr at 01/11/20 1531     LOS: 2 days        Odas Ozer Gerome Apley, MD

## 2020-01-12 NOTE — Plan of Care (Signed)
Pt continues to be confused, having visual hallucinations.  VS WNL at this time and no complaints of pain.   Problem: Clinical Measurements: Goal: Ability to maintain clinical measurements within normal limits will improve Outcome: Progressing Goal: Will remain free from infection Outcome: Progressing Goal: Diagnostic test results will improve Outcome: Progressing Goal: Respiratory complications will improve Outcome: Progressing Goal: Cardiovascular complication will be avoided Outcome: Progressing   Problem: Coping: Goal: Level of anxiety will decrease Outcome: Progressing   Problem: Elimination: Goal: Will not experience complications related to bowel motility Outcome: Progressing Goal: Will not experience complications related to urinary retention Outcome: Progressing   Problem: Pain Managment: Goal: General experience of comfort will improve Outcome: Progressing   Problem: Safety: Goal: Ability to remain free from injury will improve Outcome: Progressing   Problem: Skin Integrity: Goal: Risk for impaired skin integrity will decrease Outcome: Progressing

## 2020-01-13 LAB — CBC WITH DIFFERENTIAL/PLATELET
Abs Immature Granulocytes: 0.1 10*3/uL — ABNORMAL HIGH (ref 0.00–0.07)
Basophils Absolute: 0.1 10*3/uL (ref 0.0–0.1)
Basophils Relative: 1 %
Eosinophils Absolute: 0.4 10*3/uL (ref 0.0–0.5)
Eosinophils Relative: 6 %
HCT: 33.2 % — ABNORMAL LOW (ref 36.0–46.0)
Hemoglobin: 10.9 g/dL — ABNORMAL LOW (ref 12.0–15.0)
Immature Granulocytes: 2 %
Lymphocytes Relative: 20 %
Lymphs Abs: 1.3 10*3/uL (ref 0.7–4.0)
MCH: 30.4 pg (ref 26.0–34.0)
MCHC: 32.8 g/dL (ref 30.0–36.0)
MCV: 92.7 fL (ref 80.0–100.0)
Monocytes Absolute: 0.5 10*3/uL (ref 0.1–1.0)
Monocytes Relative: 7 %
Neutro Abs: 4.3 10*3/uL (ref 1.7–7.7)
Neutrophils Relative %: 64 %
Platelets: 206 10*3/uL (ref 150–400)
RBC: 3.58 MIL/uL — ABNORMAL LOW (ref 3.87–5.11)
RDW: 14.5 % (ref 11.5–15.5)
WBC: 6.7 10*3/uL (ref 4.0–10.5)
nRBC: 0 % (ref 0.0–0.2)

## 2020-01-13 LAB — BASIC METABOLIC PANEL
Anion gap: 8 (ref 5–15)
BUN: <5 mg/dL — ABNORMAL LOW (ref 8–23)
CO2: 24 mmol/L (ref 22–32)
Calcium: 9.6 mg/dL (ref 8.9–10.3)
Chloride: 108 mmol/L (ref 98–111)
Creatinine, Ser: 0.63 mg/dL (ref 0.44–1.00)
GFR, Estimated: >60 mL/min (ref >60)
Glucose, Bld: 122 mg/dL — ABNORMAL HIGH (ref 70–99)
Potassium: 3.7 mmol/L (ref 3.5–5.1)
Sodium: 140 mmol/L (ref 135–145)

## 2020-01-13 LAB — MAGNESIUM: Magnesium: 2 mg/dL (ref 1.7–2.4)

## 2020-01-13 MED ORDER — POTASSIUM CHLORIDE CRYS ER 20 MEQ PO TBCR: 40.0000 meq | EXTENDED_RELEASE_TABLET | Freq: Once | ORAL | Status: DC

## 2020-01-13 MED ORDER — POTASSIUM CHLORIDE 20 MEQ PO PACK
40.0000 meq | PACK | Freq: Once | ORAL | Status: AC
  Administered 2020-01-13: 40 meq via ORAL
  Filled 2020-01-13: qty 2

## 2020-01-13 NOTE — Plan of Care (Signed)
Pt VS WNL.  Pt continues to report blurred vision, dizziness.   Problem: Education: Goal: Knowledge of General Education information will improve Description: Including pain rating scale, medication(s)/side effects and non-pharmacologic comfort measures Outcome: Progressing   Problem: Health Behavior/Discharge Planning: Goal: Ability to manage health-related needs will improve Outcome: Progressing   Problem: Clinical Measurements: Goal: Ability to maintain clinical measurements within normal limits will improve Outcome: Progressing Goal: Will remain free from infection Outcome: Progressing Goal: Diagnostic test results will improve Outcome: Progressing Goal: Respiratory complications will improve Outcome: Progressing Goal: Cardiovascular complication will be avoided Outcome: Progressing   Problem: Nutrition: Goal: Adequate nutrition will be maintained Outcome: Progressing   Problem: Elimination: Goal: Will not experience complications related to bowel motility Outcome: Progressing Goal: Will not experience complications related to urinary retention Outcome: Progressing   Problem: Pain Managment: Goal: General experience of comfort will improve Outcome: Progressing   Problem: Safety: Goal: Ability to remain free from injury will improve Outcome: Progressing   Problem: Skin Integrity: Goal: Risk for impaired skin integrity will decrease Outcome: Progressing   Problem: Activity: Goal: Risk for activity intolerance will decrease Outcome: Not Progressing   Problem: Coping: Goal: Level of anxiety will decrease Outcome: Not Progressing

## 2020-01-13 NOTE — Progress Notes (Signed)
PROGRESS NOTE    Nicole [name]  JYN:[id number] DOB: [birth year] DOA: 01/10/2020 PCP: [name], MD    Brief Narrative:  [name] was admitted to the hospital working diagnosis of sepsis due to urinary tract infection.(Present on admission)  74 year old female past medical history for type 2 diabetes mellitus, dyslipidemia, heart failure, coronary disease, GERD, irritable bowel syndrome and fibromyalgia who presents with weakness and dizziness. Patient has been recently discharged from skilled nursing facility to home about 7 days ago. Over the last 3 days prior to hospitalization patient developed dizziness, weakness, nausea and vomiting. Poor p.o. intake, unable to take her medications at home. She noticed decreased urine output. On her initial physical examination she was febrile, 38 C, blood pressure 129/87, heart rate 104, respiratory rate 16, oxygen saturation 99%, lungs are clear to auscultation bilaterally, heart S1-S2, tachycardic, no gallops, abdomen soft, no lower extremity edema. Sodium 138, potassium 2.6, chloride 95, bicarb 27, glucose 123, BUN 23, creatinine 0.89, white count 13.9, hemoglobin 14.6, hematocrit 42.5, platelets 325. Troponin I 19-19, SARS COVID-19 negative.  Urinalysis specific gravity 1.025, 6-10 red cells, more than 50 white cells.  Patient has been placed on antibiotic therapy with improvement of her symptoms.  Continue to be very weak and deconditioned, positive vertigo and dis- balance.     Assessment & Plan:   Principal Problem:   Sepsis secondary to UTI [facility]) Active Problems:   Anxiety   Depression   Diabetes mellitus type 2 in obese (HCC)   Asthma   Fibromyalgia   GERD with esophagitis   Major depressive disorder   1. Sepsis due to urine infection, present on admission. No leukocytosis, and cultures continue with no growth. Patient continue to have mild dysuria.   On IV ceftriaxone #3/5.   2. Hypokalemia and hypomagnesemia.  Stable renal function with serum cr at 0.63, K is 3,7 and bicarbonate is 24. Mg is 2,0.   Improved po intake, will hold on IV fluids, add 40 meq Kcl today.    3. COPD/ Asthma. No current clinical signs of acute exacerbation, continue with albuterol, fluticasone and vilanterol.   4. Anxiety/ insomnia/ fibromyalgia.  On orazepam and duloxetine. Tolerated well trazodone at night for sleep.   On as needed tramadol for pain.    5. Systolic heart failure. Continue with entresto, metoprolol and ticagrelor. No sign of decompensation, hold on IV fluids.   6. Positive vertigo/ bening positional vertigo.   She does have positive nystagmus and worsening symptoms with abrupt head movement.  MRI and CT head negative for CVA  Will follow with PT and PT, Eply maneuver,.   7. T2DM/ controlled. Fasting glucose this am is 122. Patient off insulin therapy.    Status is: Inpatient  Remains inpatient appropriate because:Inpatient level of care appropriate due to severity of illness   Dispo: The patient is from: Home              Anticipated d/c is to: Home              Anticipated d/c date is: 2 days              Patient currently is not medically stable to d/c.   DVT prophylaxis: Enoxaparin   Code Status:   full  Family Communication:       Nutrition Status: Nutrition Problem: Inadequate oral intake Etiology: nausea, vomiting, poor appetite Signs/Symptoms: per patient/family report Interventions: Ensure Enlive (each supplement provides 350kcal and 20 grams of protein), Magic  cup    antibiotic therapy: ceftriaxone IV   Subjective: Patient continue to have double vision and disbalance, positive spinning sensation. No nausea or vomiting, no chest pain or dyspnea, continue to have mild dysuria.   Objective: Vitals:   01/12/20 1426 01/12/20 2105 01/13/20 0629 01/13/20 0859  BP: 135/88 (!) 113/59 120/62 126/83  Pulse: 93 77 77 89  Resp: 17 20 20    Temp: 97.9 F (36.6 C) 98.5 F  (36.9 C) 98.4 F (36.9 C)   TempSrc: Oral Oral Oral   SpO2: 99% 99% 95%   Weight:      Height:        Intake/Output Summary (Last 24 hours) at 01/13/2020 0913 Last data filed at 01/12/2020 2000 Gross per 24 hour  Intake 81.4 ml  Output 1750 ml  Net -1668.6 ml   Filed Weights   01/10/20 1503 01/10/20 2119  Weight: 65.8 kg 63.3 kg    Examination:   General: Not in pain or dyspnea, deconditioned  Neurology: Awake and alert, non focal. [ositive vertical nystagmus. More symptomatic with head movement.  E ENT: mild pallor, no icterus, oral mucosa moist Cardiovascular: No JVD. S1-S2 present, rhythmic, no gallops, rubs, or murmurs. No lower extremity edema. Pulmonary: positive breath sounds bilaterally, adequate air movement, no wheezing, rhonchi or rales. Gastrointestinal. Abdomen soft and non tender Skin. No rashes Musculoskeletal: no joint deformities     Data Reviewed: I have personally reviewed following labs and imaging studies  CBC: Recent Labs  Lab 01/10/20 1636 01/10/20 1655 01/11/20 0515 01/12/20 0537 01/13/20 0551  WBC 13.9*  --  8.8 9.8 6.7  NEUTROABS 10.0*  --   --  7.3 4.3  HGB 14.6 15.0 10.9* 12.2 10.9*  HCT 42.5 44.0 32.6* 36.2 33.2*  MCV 88.4  --  90.6 89.8 92.7  PLT 325  --  229 260 892   Basic Metabolic Panel: Recent Labs  Lab 01/10/20 1636 01/10/20 1655 01/10/20 1845 01/11/20 0515 01/12/20 0537 01/13/20 0551  NA 138 139  --  137 138 140  K 2.6* 2.9*  --  3.0* 3.6 3.7  CL 95* 93*  --  101 107 108  CO2 27  --   --  26 21* 24  GLUCOSE 123* 123*  --  103* 159* 122*  BUN 23 26*  --  24* 8 <5*  CREATININE 0.89 0.80  --  0.99 0.59 0.63  CALCIUM 8.4*  --   --  7.6* 8.6* 9.6  MG  --   --  0.3* 1.2*  --  2.0   GFR: Estimated Creatinine Clearance: 55.6 mL/min (by C-G formula based on SCr of 0.63 mg/dL). Liver Function Tests: Recent Labs  Lab 01/10/20 1636 01/11/20 0515  AST 16 12*  ALT 16 12  ALKPHOS 76 57  BILITOT 1.2 1.3*  PROT 8.3*  6.2*  ALBUMIN 4.1 3.1*   No results for input(s): LIPASE, AMYLASE in the last 168 hours. No results for input(s): AMMONIA in the last 168 hours. Coagulation Profile: Recent Labs  Lab 01/11/20 0515  INR 1.1   Cardiac Enzymes: No results for input(s): CKTOTAL, CKMB, CKMBINDEX, TROPONINI in the last 168 hours. BNP (last 3 results) No results for input(s): PROBNP in the last 8760 hours. HbA1C: No results for input(s): HGBA1C in the last 72 hours. CBG: No results for input(s): GLUCAP in the last 168 hours. Lipid Profile: No results for input(s): CHOL, HDL, LDLCALC, TRIG, CHOLHDL, LDLDIRECT in the last 72 hours. Thyroid Function Tests: Recent  Labs    01/11/20 0515  TSH 0.945   Anemia Panel: No results for input(s): VITAMINB12, FOLATE, FERRITIN, TIBC, IRON, RETICCTPCT in the last 72 hours.    Radiology Studies: I have reviewed all of the imaging during this hospital visit personally     Scheduled Meds: . aspirin EC  81 mg Oral Daily  . diphenhydrAMINE  12.5 mg Intravenous Once  . DULoxetine  60 mg Oral Daily  . enoxaparin (LOVENOX) injection  40 mg Subcutaneous Q24H  . feeding supplement  237 mL Oral BID BM  . fluticasone furoate-vilanterol  1 puff Inhalation Daily  . loratadine  10 mg Oral Daily  . LORazepam  1 mg Oral BID  . mouth rinse  15 mL Mouth Rinse BID  . metoprolol succinate  50 mg Oral Q breakfast  . multivitamin with minerals  1 tablet Oral Daily  . pantoprazole  40 mg Oral BID  . potassium chloride  40 mEq Oral Once  . sacubitril-valsartan  1 tablet Oral BID  . ticagrelor  90 mg Oral BID  . traZODone  50 mg Oral QHS  . Vitamin D (Ergocalciferol)  50,000 Units Oral Weekly   Continuous Infusions: . cefTRIAXone (ROCEPHIN)  IV 1 g (01/12/20 1023)  . dextrose 5% lactated ringers 50 mL/hr at 01/11/20 1531     LOS: 3 days        Lilyanna Lunt Gerome Apley, MD

## 2020-01-13 NOTE — Evaluation (Signed)
Occupational Therapy Evaluation Patient Details Name: Nicole [name] MRN: [id number] DOB: [birth year] Today's Date: 01/13/2020    History of Present Illness 74 y.o. female, with hx of T2DM, HLD, CHF, CAD, Fibromyalgia, IBS, GERD,  presents to the ED with weakness and dizziness. Patient was hospitalized a month ago for sepsis 2/2 pneumonia, NSTEMI, and hypokalemia. She was [facility] where she had 2 weeks of rehab, was then home for a week. Now admitted with nausea, vomiting, weakness, paresthesias, head ache. Dx of sepsis 2* UTI, hypokalemia.   Clinical Impression   Patient presents with impaired balance and decreased activity tolerance resulting in unsafe mobility and ADLs requiring min guard for ADLs, min assist for steadying and verbal cues to manage rollator. Patient exhibits some nystagmus at rest but did not report visual disturbances or dizziness during evaluation. Patient will benefit from skilled OT services while in hospital in order to improve deficits and learn compensatory strategies as needed in order to improve independence.     Follow Up Recommendations  Home health OT;Supervision/Assistance - 24 hour    Equipment Recommendations  None recommended by OT    Recommendations for Other Services       Precautions / Restrictions Precautions Precautions: Fall Precaution Comments: daughter reports 1 fall ~1 month ago, pt tripped on a root in the dark. Pt now unsteady/dizzy and is a high fall risk. Restrictions Weight Bearing Restrictions: No      Mobility Bed Mobility Overal bed mobility: Needs Assistance Bed Mobility: Supine to Sit     Supine to sit: HOB elevated;Supervision     General bed mobility comments: use of bed rail and increased time to transfer to side of bed    Transfers Overall transfer level: Needs assistance Equipment used: 4-wheeled walker   Sit to Stand: Min assist         General transfer comment: VCs hand placement, min A to steady with  ambulation. Used rollator in room and patient needed persistent verbal cues to lock rollator. Doesn't appear to be experienced with rollator.    Balance Overall balance assessment: Needs assistance Sitting-balance support: Feet supported;No upper extremity supported Sitting balance-Leahy Scale: Good Sitting balance - Comments: nystagmus noted at rest   Standing balance support: Bilateral upper extremity supported;During functional activity Standing balance-Leahy Scale: Poor Standing balance comment: relies on BUE support                           ADL either performed or assessed with clinical judgement   ADL Overall ADL's : Needs assistance/impaired Eating/Feeding: Independent   Grooming: Wash/dry hands;Standing;Min guard Grooming Details (indicate cue type and reason): stood at sink Upper Body Bathing: Set up;Sitting   Lower Body Bathing: Minimal assistance;Sit to/from stand   Upper Body Dressing : Set up;Sitting   Lower Body Dressing: Sit to/from stand;Set up;Min guard Lower Body Dressing Details (indicate cue type and reason): min guard for standing after doning socks Toilet Transfer: Min guard;Cueing for sequencing;BSC;Grab bars;Regular Materials engineer Details (indicate cue type and reason): verbal cues for hand placement for safety on grab bar and locking Rollator breaks. Toileting- Water quality scientist and Hygiene: Min guard;Sit to/from stand               Vision   Additional Comments: Nystagmus noted in eyes.     Perception     Praxis      Pertinent Vitals/Pain Pain Assessment: No/denies pain     Hand Dominance  Extremity/Trunk Assessment Upper Extremity Assessment Upper Extremity Assessment: Overall WFL for tasks assessed   Lower Extremity Assessment Lower Extremity Assessment: Defer to PT evaluation   Cervical / Trunk Assessment Cervical / Trunk Assessment: Normal   Communication     Cognition Arousal/Alertness:  Awake/alert Behavior During Therapy: WFL for tasks assessed/performed Overall Cognitive Status: Within Functional Limits for tasks assessed                                     General Comments       Exercises     Shoulder Instructions      Home Living                                          Prior Functioning/Environment                   OT Problem List: Decreased activity tolerance;Impaired balance (sitting and/or standing);Decreased safety awareness      OT Treatment/Interventions: Self-care/ADL training;Therapeutic exercise;DME and/or AE instruction;Therapeutic activities;Patient/family education;Balance training    OT Goals(Current goals can be found in the care plan section) Acute Rehab OT Goals Patient Stated Goal: To be independent OT Goal Formulation: With patient Time For Goal Achievement: 01/27/20 Potential to Achieve Goals: Good  OT Frequency: Min 2X/week   Barriers to D/C:            Co-evaluation              AM-PAC OT "6 Clicks" Daily Activity     Outcome Measure Help from another person eating meals?: None Help from another person taking care of personal grooming?: A Little Help from another person toileting, which includes using toliet, bedpan, or urinal?: A Little Help from another person bathing (including washing, rinsing, drying)?: A Little Help from another person to put on and taking off regular upper body clothing?: A Little Help from another person to put on and taking off regular lower body clothing?: A Little 6 Click Score: 19   End of Session Equipment Utilized During Treatment: Gait belt (rollator) Nurse Communication: Mobility status  Activity Tolerance: Patient tolerated treatment well Patient left: in chair;with call bell/phone within reach;with chair alarm set  OT Visit Diagnosis: Unsteadiness on feet (R26.81);Dizziness and giddiness (R42)                Time: 9242-6834 OT Time  Calculation (min): 16 min Charges:  OT General Charges $OT Visit: 1 Visit OT Evaluation $OT Eval Low Complexity: 1 Low  Evah Rashid, OTR/L [place]  Office [phone] Pager: [place] 01/13/2020, 10:28 AM

## 2020-01-13 NOTE — Progress Notes (Signed)
Physical Therapy Treatment Patient Details Name: Nicole [name] MRN: [id number] DOB: [birth year] Today's Date: 01/13/2020    History of Present Illness 74 y.o. female, with hx of T2DM, HLD, CHF, CAD, Fibromyalgia, IBS, GERD,  presents to the ED with weakness and dizziness. Patient was hospitalized a month ago for sepsis 2/2 pneumonia, NSTEMI, and hypokalemia. She was [facility]ed [facility] where she had 2 weeks of rehab, was then home for a week. Now admitted with nausea, vomiting, weakness, paresthesias, head ache. Dx of sepsis 2* UTI, hypokalemia.    PT Comments    Nicole [name] is 74 y.o. female admitted with above HPI and diagnosis. Patient is currently limited by functional impairments below (see PT problem list). Patient seen for vestibular assessment this session and findings are detailed below.  Patient has spontaneous down beating nystagmus at rest in room light. With both Rt/Lt gaze she is noted to have a Rt downward beating nystagmus with greater symptoms of "pain" with Lt gaze. Saccades appear intact however smooth pursuits is abnormal with pt having difficulty tracking which may be concerning for central pathology. She also experiences increased oscillopsia with head shaking nystagmus indicating a decreased VOR. Marye Round testing was inconclusive as she does have down beating torsional nystagmus but has no change in symptoms and nystagmus continues >60 seconds. During testing she was noted to have increased symptoms in Lt side lying vs Rt.  Findings indicate she may have a bilateral vestibular loss resulting in impaired balance, oscillopsia, and gait instability. However it is not possible to rule out a Lt unilateral vestibular loss with a possible anterior canal BPPV. Given the chronicity of symptoms as pt reports they began in May 2021, she should no longer have spontaneous nystagmus in room light, it is also important to note it is a pure vertical down beat at rest in room light but does  become torsional with gaze evoked nystagmus. She also has symptoms of blurred vision, diplopia, Lt hear ache, numbness in hands and feet, and headache.   Patient will benefit from continued skilled PT interventions to address impairments and progress independence with mobility. Recommending OPPT follow up for vestibular evaluation with [facility] as well as follow up with ENT and Neurology. Acute PT will follow and progress as able.      Follow Up Recommendations  Outpatient PT;Other (comment) (vestibular therapy; ENT and neurology follow up recommended)     Equipment Recommendations  3in1 (PT);Other (comment) (rollator)    Recommendations for Other Services       Precautions / Restrictions Precautions Precautions: Fall Precaution Comments: daughter reports 1 fall ~1 month ago, pt tripped on a root in the dark. Pt now unsteady/dizzy and is a high fall risk. Restrictions Weight Bearing Restrictions: No        Vestibular Assessment - 01/13/20 0001      Symptom Behavior   Subjective history of current problem Pt reports dizziness began in May after she had COVID19 and after undergoing a cardiac stent. She reports it has gradually gotten worse. She has constant dizziness with a sensation of the room/world spinning. She sometimes experiences diplopia, and sometimes Lt ear pain but denies hearing impairment.    Type of Dizziness  Oscillopsia;Blurred vision;"World moves";Spinning;Diplopia    Frequency of Dizziness constant    Duration of Dizziness severity fluctuates    Symptom Nature Variable;Constant    Aggravating Factors No known aggravating factors   light sensitivity   Relieving Factors Closing eyes   laying on  Rt side; being still   Progression of Symptoms Worse      Oculomotor Exam   Oculomotor Alignment Normal    Spontaneous Down beating nystagmus    Gaze-induced  Right beating nystagmus with L gaze;Right beating nystagmus with R gaze   Lt side hurts, rotational with Lt  gaze   Head shaking Horizontal Comment   donwbeating   Head Shaking Vertical Comment   downbeating   Smooth Pursuits Saccades    Saccades Intact;Comment   Rt beat nystagmus present   Comment Head Impulse Test demonstrates decreased VOR.       Positional Testing   Dix-Hallpike Dix-Hallpike Right;Dix-Hallpike Left      Dix-Hallpike Right   Dix-Hallpike Right Duration nystagmus and symptoms >60 seconds    Dix-Hallpike Right Symptoms Downbeat Nystagmus;Downbeat, right rotatory nystagmus      Dix-Hallpike Left   Dix-Hallpike Left Duration nystagmus and symptoms >60 seconds    Dix-Hallpike Left Symptoms Downbeat Nystagmus;Downbeat, right rotatory nystagmus      Cognition   Cognition Orientation Level Oriented x 4            Mobility  Bed Mobility Overal bed mobility: Needs Assistance Bed Mobility: Supine to Sit;Sit to Supine;Sit to Sidelying;Sidelying to Sit   Sidelying to sit: Min guard;Supervision Supine to sit: Supervision;Min guard Sit to supine: Supervision;Min guard Sit to sidelying: Supervision;Min guard General bed mobility comments: pt able to roll and move supine>sit in bed as well as move long sit to supine with supervision for safety.  Transfers Overall transfer level: Needs assistance Equipment used: 1 person hand held assist Transfers: Sit to/from [facility] Sit to Stand: Min assist Stand pivot transfers: Min assist       General transfer comment: HHA required and min assist to steady in standing. Pt taking small cautious steps for stand step to move chair>bed.   Ambulation/Gait Ambulation/Gait assistance: Min assist Gait Distance (Feet): 15 Feet Assistive device: 1 person hand held assist Gait Pattern/deviations: Step-through pattern;Decreased stride length;Decreased step length - left;Decreased step length - right;Shuffle Gait velocity: decr   General Gait Details: VERY Unsteady. pt required min assist to prevent LOB to ambulate around  the bed to her recliner.    Stairs             Wheelchair Mobility    Modified Rankin (Stroke Patients Only)       Balance Overall balance assessment: Needs assistance Sitting-balance support: Feet supported;No upper extremity supported Sitting balance-Leahy Scale: Good Sitting balance - Comments: nystagmus noted at rest   Standing balance support: Single extremity supported;During functional activity Standing balance-Leahy Scale: Poor Standing balance comment: reliant on external support Single Leg Stance - Right Leg: 0 Single Leg Stance - Left Leg: 0     Rhomberg - Eyes Opened: 4 (signifiacnt postural sway noted) Rhomberg - Eyes Closed: 0 (Assist required to prevent LOB and pt opening eyes)                Cognition Arousal/Alertness: Awake/alert Behavior During Therapy: WFL for tasks assessed/performed Overall Cognitive Status: Within Functional Limits for tasks assessed             Exercises      General Comments        Pertinent Vitals/Pain Pain Assessment: Faces Faces Pain Scale: Hurts a little bit Pain Location: headache Pain Descriptors / Indicators: Headache Pain Intervention(s): Monitored during session;Limited activity within patient's tolerance;Repositioned    Home Living   Prior Function    PT  Goals (current goals can now be found in the care plan section) Acute Rehab PT Goals Patient Stated Goal: To be independent PT Goal Formulation: With patient/family Time For Goal Achievement: 01/26/20 Potential to Achieve Goals: Good Progress towards PT goals: Progressing toward goals    Frequency    Min 3X/week      PT Plan Discharge plan needs to be updated    Co-evaluation              AM-PAC PT "6 Clicks" Mobility   Outcome Measure  Help needed turning from your back to your side while in a flat bed without using bedrails?: None Help needed moving from lying on your back to sitting on the side of a flat bed without  using bedrails?: A Little Help needed moving to and from a bed to a chair (including a wheelchair)?: A Little Help needed standing up from a chair using your arms (e.g., wheelchair or bedside chair)?: A Little Help needed to walk in hospital room?: A Little Help needed climbing 3-5 steps with a railing? : A Lot 6 Click Score: 18    End of Session Equipment Utilized During Treatment: Gait belt Activity Tolerance: Patient tolerated treatment well Patient left: in chair;with call bell/phone within reach;with chair alarm set Nurse Communication: Mobility status PT Visit Diagnosis: Difficulty in walking, not elsewhere classified (R26.2);History of falling (Z91.81);Dizziness and giddiness (R42)     Time: 5015-8682 PT Time Calculation (min) (ACUTE ONLY): 56 min  Charges:  $Therapeutic Activity: 8-22 mins $[place] Rep Proc: 8-22 mins $Physical Performance Test: 23-37 mins                     Verner Mould, DPT Acute Rehabilitation Services  Office [phone] Pager [phone]  01/13/2020 2:24 PM

## 2020-01-14 MED ORDER — SULFAMETHOXAZOLE-TRIMETHOPRIM 800-160 MG PO TABS
1.0000 | ORAL_TABLET | Freq: Two times a day (BID) | ORAL | Status: DC
  Administered 2020-01-15: 1 via ORAL
  Filled 2020-01-14: qty 1

## 2020-01-14 NOTE — Plan of Care (Signed)
Pt oriented x4 this morning, reports improvement in pain level.  Continues to have issues with blurry vision and dizziness, but no longer experiencing double vision.   Problem: Education: Goal: Knowledge of General Education information will improve Description: Including pain rating scale, medication(s)/side effects and non-pharmacologic comfort measures Outcome: Progressing   Problem: Health Behavior/Discharge Planning: Goal: Ability to manage health-related needs will improve Outcome: Progressing   Problem: Clinical Measurements: Goal: Ability to maintain clinical measurements within normal limits will improve Outcome: Progressing Goal: Will remain free from infection Outcome: Progressing Goal: Diagnostic test results will improve Outcome: Progressing Goal: Respiratory complications will improve Outcome: Progressing Goal: Cardiovascular complication will be avoided Outcome: Progressing   Problem: Activity: Goal: Risk for activity intolerance will decrease Outcome: Progressing   Problem: Nutrition: Goal: Adequate nutrition will be maintained Outcome: Progressing   Problem: Coping: Goal: Level of anxiety will decrease Outcome: Progressing   Problem: Elimination: Goal: Will not experience complications related to bowel motility Outcome: Progressing Goal: Will not experience complications related to urinary retention Outcome: Progressing   Problem: Pain Managment: Goal: General experience of comfort will improve Outcome: Progressing   Problem: Safety: Goal: Ability to remain free from injury will improve Outcome: Progressing   Problem: Skin Integrity: Goal: Risk for impaired skin integrity will decrease Outcome: Progressing

## 2020-01-14 NOTE — Progress Notes (Signed)
PROGRESS NOTE    Nicole [name]  OVZ:[id number] DOB: Jun 25, 1945 DOA: 01/10/2020 PCP: [name], MD    Brief Narrative:  Nicole [name] was admitted to the hospital working diagnosis of sepsis due to urinary tract infection.(Present on admission)  74 year old female past medical history for type 2 diabetes mellitus, dyslipidemia, heart failure, coronary disease, GERD, irritable bowel syndrome and fibromyalgia who presents with weakness and dizziness. Patient has been recently discharged from skilled nursing facility to home about 7 days ago. Over the last 3 days prior to hospitalization patient developed dizziness, weakness, nausea and vomiting. Poor p.o. intake, unable to take her medications at home. She noticed decreased urine output. On her initial physical examination she was febrile, 38 C, blood pressure 129/87, heart rate 104, respiratory rate 16, oxygen saturation 99%, lungs are clear to auscultation bilaterally, heart S1-S2, tachycardic, no gallops, abdomen soft, no lower extremity edema. Sodium 138, potassium 2.6, chloride 95, bicarb 27, glucose 123, BUN 23, creatinine 0.89, white count 13.9, hemoglobin 14.6, hematocrit 42.5, platelets 325. Troponin I 19-19, SARS COVID-19 negative.  Urinalysis specific gravity 1.025, 6-10 red cells, more than 50 white cells.  Patient has been placed on antibiotic therapy with improvement of her symptoms.  Continue to be very weak and deconditioned, positive vertigo and dis- balance.    Assessment & Plan:   Principal Problem:   Sepsis secondary to UTI [facility]) Active Problems:   Anxiety   Depression   Diabetes mellitus type 2 in obese (HCC)   Asthma   Fibromyalgia   GERD with esophagitis   Major depressive disorder    1. Sepsis due to urine infection, present on admission.Urinary symptoms are improving. Patient has been afebrile. Cultures with no growth.   Will change antibiotic therapy to oral bactrim for 2 more days.    2. Hypokalemia and hypomagnesemia. Improved electrolytes, will check K and Mg in am. Patient is tolerating po well, no nausea or vomiting.    3. COPD/ Asthma. No signs of of acute exacerbation. On albuterol, fluticasone and vilanterol.   4. Anxiety/insomnia/ fibromyalgia.Continue with lorazepam and duloxetine. Pain control with tramadol as needed.  Improved sleep with trazodone at night.  5. Systolic heart failure.On entresto, metoprolol and ticagrelor. Blood pressure is well controlled.   6. Positive vertigo/ vestibular dysfunction.  MRI and CT head negative for CVA No improvement with Eply maneuvers, likely not positional vertigo and more a vestibular dysfunction.   Arrange outpatient follow up with ENT and ambulatory physical therapy.   7. T2DM/ controlled. Patient tolerating po well, glucose has been well controlled, continue to hold on insulin therapy.    Status is: Inpatient  Remains inpatient appropriate because:Inpatient level of care appropriate due to severity of illness   Dispo: The patient is from: Home              Anticipated d/c is to: Home              Anticipated d/c date is: 1 day              Patient currently is not medically stable to d/c.   DVT prophylaxis: Enoxaparin   Code Status:   full  Family Communication:  I spoke over the phone with the patient's daughter about patient's  condition, plan of care, prognosis and all questions were addressed.     Nutrition Status: Nutrition Problem: Inadequate oral intake Etiology: nausea, vomiting, poor appetite Signs/Symptoms: per patient/family report Interventions: Ensure Enlive (each supplement provides 350kcal and 20  grams of protein), Magic cup     Subjective: Patient reports improvement in vertigo but not yet back to baseline, no nausea or vomiting, no chest pain or dyspnea. This am is out of bed to chair.   Objective: Vitals:   01/13/20 2034 01/14/20 0558 01/14/20 0728 01/14/20 0847   BP: (!) 114/92 134/67  123/70  Pulse: 75 79  69  Resp: 14 16    Temp: 98.7 F (37.1 C) 97.6 F (36.4 C)    TempSrc: Oral Oral    SpO2: 98% 100% 99%   Weight:      Height:        Intake/Output Summary (Last 24 hours) at 01/14/2020 1219 Last data filed at 01/13/2020 1230 Gross per 24 hour  Intake 200 ml  Output --  Net 200 ml   Filed Weights   01/10/20 1503 01/10/20 2119  Weight: 65.8 kg 63.3 kg    Examination:   General: Not in pain or dyspnea.  Neurology: Awake and alert, non focal. Positive mild nystagmus.  E ENT: no pallor, no icterus, oral mucosa moist Cardiovascular: No JVD. S1-S2 present, rhythmic, no gallops, rubs, or murmurs. No lower extremity edema. Pulmonary: positive breath sounds bilaterally, adequate air movement, no wheezing, rhonchi or rales. Gastrointestinal. Abdomen soft and non tender Skin. No rashes Musculoskeletal: no joint deformities     Data Reviewed: I have personally reviewed following labs and imaging studies  CBC: Recent Labs  Lab 01/10/20 1636 01/10/20 1655 01/11/20 0515 01/12/20 0537 01/13/20 0551  WBC 13.9*  --  8.8 9.8 6.7  NEUTROABS 10.0*  --   --  7.3 4.3  HGB 14.6 15.0 10.9* 12.2 10.9*  HCT 42.5 44.0 32.6* 36.2 33.2*  MCV 88.4  --  90.6 89.8 92.7  PLT 325  --  229 260 992   Basic Metabolic Panel: Recent Labs  Lab 01/10/20 1636 01/10/20 1655 01/10/20 1845 01/11/20 0515 01/12/20 0537 01/13/20 0551  NA 138 139  --  137 138 140  K 2.6* 2.9*  --  3.0* 3.6 3.7  CL 95* 93*  --  101 107 108  CO2 27  --   --  26 21* 24  GLUCOSE 123* 123*  --  103* 159* 122*  BUN 23 26*  --  24* 8 <5*  CREATININE 0.89 0.80  --  0.99 0.59 0.63  CALCIUM 8.4*  --   --  7.6* 8.6* 9.6  MG  --   --  0.3* 1.2*  --  2.0   GFR: Estimated Creatinine Clearance: 55.6 mL/min (by C-G formula based on SCr of 0.63 mg/dL). Liver Function Tests: Recent Labs  Lab 01/10/20 1636 01/11/20 0515  AST 16 12*  ALT 16 12  ALKPHOS 76 57  BILITOT 1.2 1.3*   PROT 8.3* 6.2*  ALBUMIN 4.1 3.1*   No results for input(s): LIPASE, AMYLASE in the last 168 hours. No results for input(s): AMMONIA in the last 168 hours. Coagulation Profile: Recent Labs  Lab 01/11/20 0515  INR 1.1   Cardiac Enzymes: No results for input(s): CKTOTAL, CKMB, CKMBINDEX, TROPONINI in the last 168 hours. BNP (last 3 results) No results for input(s): PROBNP in the last 8760 hours. HbA1C: No results for input(s): HGBA1C in the last 72 hours. CBG: No results for input(s): GLUCAP in the last 168 hours. Lipid Profile: No results for input(s): CHOL, HDL, LDLCALC, TRIG, CHOLHDL, LDLDIRECT in the last 72 hours. Thyroid Function Tests: No results for input(s): TSH, T4TOTAL, FREET4, T3FREE, THYROIDAB  in the last 72 hours. Anemia Panel: No results for input(s): VITAMINB12, FOLATE, FERRITIN, TIBC, IRON, RETICCTPCT in the last 72 hours.    Radiology Studies: I have reviewed all of the imaging during this hospital visit personally     Scheduled Meds: . aspirin EC  81 mg Oral Daily  . diphenhydrAMINE  12.5 mg Intravenous Once  . DULoxetine  60 mg Oral Daily  . enoxaparin (LOVENOX) injection  40 mg Subcutaneous Q24H  . feeding supplement  237 mL Oral BID BM  . fluticasone furoate-vilanterol  1 puff Inhalation Daily  . loratadine  10 mg Oral Daily  . LORazepam  1 mg Oral BID  . mouth rinse  15 mL Mouth Rinse BID  . metoprolol succinate  50 mg Oral Q breakfast  . multivitamin with minerals  1 tablet Oral Daily  . pantoprazole  40 mg Oral BID  . sacubitril-valsartan  1 tablet Oral BID  . ticagrelor  90 mg Oral BID  . traZODone  50 mg Oral QHS  . Vitamin D (Ergocalciferol)  50,000 Units Oral Weekly   Continuous Infusions: . cefTRIAXone (ROCEPHIN)  IV 1 g (01/14/20 1031)     LOS: 4 days        Tehillah Cipriani Gerome Apley, MD

## 2020-01-14 NOTE — Progress Notes (Signed)
Physical Therapy Treatment Patient Details Name: Nicole [name] MRN: [id number] DOB: 1945/11/12 Today's Date: 01/14/2020    History of Present Illness 74 y.o. female, with hx of T2DM, HLD, CHF, CAD, Fibromyalgia, IBS, GERD,  presents to the ED with weakness and dizziness. Patient was hospitalized a month ago for sepsis 2/2 pneumonia, NSTEMI, and hypokalemia. She was [facility]ed [facility] where she had 2 weeks of rehab, was then home for a week. Now admitted with nausea, vomiting, weakness, paresthesias, head ache. Dx of sepsis 2* UTI, hypokalemia.    PT Comments    Patient continues to be limited by dizziness, imbalance, and blurred vision for mobility. She was able to ambulate greater distance today and was educated on safe use of Rollator for ambulation and for rest breaks as needed. Patient initiated gaze stability exercises for vestibular rehab and was able to tolerate 1 minues for x1 and x2 gaze stabilization. She will continue to benefit from skilled PT services in acute setting. See follow up recommendations below for additional therapy and appropriate referrals for vestibular impariments.    Follow Up Recommendations  Outpatient PT;Other (comment) (vestibular therapy; ENT and neurology follow up recommended)     Equipment Recommendations  3in1 (PT);Other (comment) (rollator)    Recommendations for Other Services       Precautions / Restrictions Precautions Precautions: Fall Precaution Comments: daughter reports 1 fall ~1 month ago, pt tripped on a root in the dark. Pt now unsteady/dizzy and is a high fall risk. Restrictions Weight Bearing Restrictions: No    Mobility  Bed Mobility Overal bed mobility: Needs Assistance Bed Mobility: Supine to Sit     Supine to sit: Supervision;Min guard     General bed mobility comments: no assist required, pt using bed rails to sit up EOB.  Transfers Overall transfer level: Needs assistance Equipment used: 4-wheeled walker Transfers: Sit  to/from [facility] Sit to Stand: Min assist         General transfer comment: cues for safe use of Rollator and brakes, min assist to steady with rise.  Ambulation/Gait Ambulation/Gait assistance: Min assist Gait Distance (Feet): 200 Feet Assistive device: Rolling walker (2 wheeled) Gait Pattern/deviations: Step-through pattern;Decreased stride length;Decreased step length - left;Decreased step length - right Gait velocity: decr   General Gait Details: pt with improved balance using Rollator, min assist required to steady. pt continues to have blurred vision but is able to negotiate obstacles in hallway. pt with intermittent LOB (3 times) requiring assist. Pt reporting greatest dizziness with turning. Educated on safe use of Rollator as seat during gait, educated to back AD against a wall and lock it before turning to sit.   Stairs             Wheelchair Mobility    Modified Rankin (Stroke Patients Only)       Balance Overall balance assessment: Needs assistance Sitting-balance support: Feet supported;No upper extremity supported Sitting balance-Leahy Scale: Good     Standing balance support: Single extremity supported;During functional activity Standing balance-Leahy Scale: Poor Standing balance comment: reliant on external support                            Cognition Arousal/Alertness: Awake/alert Behavior During Therapy: WFL for tasks assessed/performed Overall Cognitive Status: Within Functional Limits for tasks assessed  Exercises Other Exercises Other Exercises: Gaze stabilization exercises: x1 viewing (2x 1 minute) with 1 minute rest. x2 viewing (2x 1 minute) with 1 minute rest between    General Comments        Pertinent Vitals/Pain Pain Assessment: No/denies pain    Home Living                      Prior Function            PT Goals (current goals  can now be found in the care plan section) Acute Rehab PT Goals Patient Stated Goal: To be independent PT Goal Formulation: With patient/family Time For Goal Achievement: 01/26/20 Potential to Achieve Goals: Good Progress towards PT goals: Progressing toward goals    Frequency    Min 3X/week      PT Plan Discharge plan needs to be updated    Co-evaluation              AM-PAC PT "6 Clicks" Mobility   Outcome Measure  Help needed turning from your back to your side while in a flat bed without using bedrails?: None Help needed moving from lying on your back to sitting on the side of a flat bed without using bedrails?: A Little Help needed moving to and from a bed to a chair (including a wheelchair)?: A Little Help needed standing up from a chair using your arms (e.g., wheelchair or bedside chair)?: A Little Help needed to walk in hospital room?: A Little Help needed climbing 3-5 steps with a railing? : A Lot 6 Click Score: 18    End of Session Equipment Utilized During Treatment: Gait belt Activity Tolerance: Patient tolerated treatment well Patient left: in chair;with call bell/phone within reach;with chair alarm set Nurse Communication: Mobility status PT Visit Diagnosis: Difficulty in walking, not elsewhere classified (R26.2);History of falling (Z91.81);Dizziness and giddiness (R42)     Time: 9038-3338 PT Time Calculation (min) (ACUTE ONLY): 33 min  Charges:  $Gait Training: 8-22 mins $Therapeutic Exercise: 8-22 mins                     Verner Mould, DPT Acute Rehabilitation Services  Office [phone] Pager [phone]  01/14/2020 7:15 PM

## 2020-01-14 NOTE — TOC Progression Note (Signed)
Transition of Care [facility]) - Progression Note    Patient Details  Name: Nicole [name] MRN: [id number] Date of Birth: January 24, 1946  Transition of Care [facility]) CM/SW Contact  Servando Snare, [place] Phone Number: 01/14/2020, 10:30 AM  Clinical Narrative:     Per patients daughter patient is active with [facility]. Patient had one PT session prior to hospital admit. Patient will need new [place] orders at dc. Patient receiving PT, OT and RN. Equipment has been ordered and delivered to patients room per daughter. TOC will follow for dc needs.        Expected Discharge Plan and Services                                                 Social Determinants of Health (SDOH) Interventions    Readmission Risk Interventions Readmission Risk Prevention Plan 10/22/2019  Transportation Screening (No Data)  PCP or Specialist Appt within 3-5 Days Complete  HRI or Home Care Consult Complete  Social Work Consult for [place] Planning/Counseling Complete  Palliative Care Screening Not Applicable  Medication Review [employer]) Complete  Some recent data might be hidden

## 2020-01-15 LAB — BASIC METABOLIC PANEL
Anion gap: 11 (ref 5–15)
BUN: 7 mg/dL — ABNORMAL LOW (ref 8–23)
CO2: 24 mmol/L (ref 22–32)
Calcium: 9.7 mg/dL (ref 8.9–10.3)
Chloride: 102 mmol/L (ref 98–111)
Creatinine, Ser: 0.65 mg/dL (ref 0.44–1.00)
GFR, Estimated: >60 mL/min (ref >60)
Glucose, Bld: 97 mg/dL (ref 70–99)
Potassium: 3.9 mmol/L (ref 3.5–5.1)
Sodium: 137 mmol/L (ref 135–145)

## 2020-01-15 LAB — CULTURE, BLOOD (ROUTINE X 2)
Culture: NO GROWTH
Culture: NO GROWTH
Special Requests: ADEQUATE

## 2020-01-15 MED ORDER — ENSURE ENLIVE PO LIQD: 237.0000 mL | Freq: Two times a day (BID) | ORAL | 0 refills | Status: AC

## 2020-01-15 MED ORDER — PROMETHAZINE HCL 12.5 MG PO TABS: 12.5000 mg | ORAL_TABLET | Freq: Four times a day (QID) | ORAL | 0 refills | Status: DC | PRN

## 2020-01-15 NOTE — Discharge Summary (Addendum)
Physician Discharge Summary  Nicole [name]:[id number] DOB: [birth year] DOA: 01/10/2020  PCP: [name], MD  Admit date: 01/10/2020 Discharge date: 01/15/2020  Admitted From: Home  Disposition:  Home   Recommendations for Outpatient Follow-up and new medication changes:  1. Follow up with Dr. Forde Dandy in 7 days.  2. Follow with Dr. Redmond Baseman from ENT on 02/01/20.   Home Health:  Outpatient PT / resume home health services Equipment/Devices: no    Discharge Condition: stable  CODE STATUS: full  Diet recommendation: heart healthy   Brief/Interim Summary: Nicole [name] was admitted to the hospital with the working diagnosis of sepsis due to urinary tract infection.(Present on admission)  74 year old female past medical history for type 2 diabetes mellitus, dyslipidemia, heart failure, coronary disease, GERD, irritable bowel syndrome, chronic vertigo and fibromyalgia who presents with weakness and dizziness. Patient has been recently discharged from skilled nursing facility to home about 7 days ago. Over the last 3 days prior to hospitalization patient developed dizziness, weakness, nausea and vomiting. Poor p.o. intake, unable to take her medications at home. She noticed decreased urine output. On her initial physical examination she was febrile, 38 C, blood pressure 129/87, heart rate 104, respiratory rate 16, oxygen saturation 99%, lungs were clear to auscultation bilaterally, heart S1-S2, tachycardic, no gallops, abdomen soft, no lower extremity edema. Sodium 138, potassium 2.6, chloride 95, bicarb 27, glucose 123, BUN 23, creatinine 0.89, white count 13.9, hemoglobin 14.6, hematocrit 42.5, platelets 325. Troponin I 19-19, SARS COVID-19 negative.  Urinalysis specific gravity 1.025, 6-10 red cells, more than 50 white cells.  Patient was placed on antibiotic therapy with improvement of her symptoms. Electrolytes were corrected.  Vertigo improved slowly, but continue to be present.  Physical therapy recommendation continue outpatient therapy and ENT evaluation.    1. Sepsis due to urine infection, present on admission.  She was admitted to the medical ward, received intravenous antibiotics with ceftriaxone, blood cultures were no growth.  Her symptoms improved and she was transitioned to to Bactrim to finish her course of antibiotics.  2.  Severe hypokalemia and hypomagnesemia.  Diuretic therapy was held, she had potassium chloride and magnesium sulfate for electrolyte correction. At discharge her sodium 137, potassium 3.9, chloride 102, bicarb 24, glucose 97, BUN 7, creatinine 0.65, magnesium 2.0.  3.  COPD/asthma.  No signs of acute exacerbation, continue bronchodilators and inhaled corticosteroids.  4.  Anxiety, insomnia, fibromyalgia.  She was continued on lorazepam and duloxetine.  For insomnia she was placed on trazodone during her hospitalization with good response.  5.  Systolic heart failure.  No signs of acute exacerbation, patient received intravenous fluids with good toleration, continue Entresto, metoprolol and ticagrelor.  6.  Acute on chronic vertigo, vestibular dysfunction.  Further work-up with brain MRI and head CT were negative for acute CVA.  Patient underwent Epley maneuvers with no improvement of her symptoms. Patient will need outpatient follow-up with physical therapy, appointment has been made with Dr. Redmond Baseman from ENT.  7.  Type 2 diabetes mellitus, controlled/ dyslipidemia.  Her glucose remained well controlled during her hospitalization.  Continue with statin therapy.   Discharge Diagnoses:  Principal Problem:   Sepsis secondary to UTI [facility]) Active Problems:   Anxiety   Depression   Diabetes mellitus type 2 in obese (HCC)   Asthma   Fibromyalgia   GERD with esophagitis   Major depressive disorder    Discharge Instructions  Discharge Instructions    Ambulatory referral to ENT   Complete by:  As directed    ENT evaluation for  chronic vertigo. Refractive to Epley maneuvers.     Allergies as of 01/15/2020      Reactions   Fish-derived Products Anaphylaxis   Shellfish Allergy Shortness Of Breath   In dye for scan.   Aspirin Nausea Only, Other (See Comments)   Pt stated it only gives her problems when she takes the high dose (325 mg)   Benadryl [diphenhydramine]    "flips out with it. Eyes roll to the back of her head."   Ivp Dye [iodinated Diagnostic Agents]    Hx of ? anaphlaxis   Latex Other (See Comments)   Pt says she gets a "stinky infection"      Medication List    STOP taking these medications   ondansetron 4 MG tablet Commonly known as: ZOFRAN   potassium chloride SA 20 MEQ tablet Commonly known as: KLOR-CON     TAKE these medications   Advair Diskus 250-50 MCG/DOSE Aepb Generic drug: Fluticasone-Salmeterol Inhale 1 puff into the lungs 2 (two) times daily.   aspirin 81 MG tablet Take 81 mg by mouth daily.   atorvastatin 80 MG tablet Commonly known as: LIPITOR Take 1 tablet (80 mg total) by mouth at bedtime.   DULoxetine 60 MG capsule Commonly known as: CYMBALTA Take 60 mg by mouth daily.   ergocalciferol 1.25 MG (50000 UT) capsule Commonly known as: VITAMIN D2 Take 50,000 Units by mouth once a week. Notes to patient: Take on 01/18/20   feeding supplement Liqd Take 237 mLs by mouth 2 (two) times daily between meals.   fluticasone 50 MCG/ACT nasal spray Commonly known as: FLONASE Place 1 spray into both nostrils daily as needed for allergies or rhinitis.   furosemide 40 MG tablet Commonly known as: LASIX Take 1 tablet (40 mg total) by mouth daily.   loratadine 10 MG tablet Commonly known as: CLARITIN Take 1 tablet (10 mg total) by mouth daily.   LORazepam 1 MG tablet Commonly known as: ATIVAN Take 1 tablet (1 mg total) by mouth 2 (two) times daily.   metoprolol succinate 50 MG 24 hr tablet Commonly known as: TOPROL-XL Take 1 tablet (50 mg total) by mouth daily. Take  with or immediately following a meal.   omeprazole 20 MG capsule Commonly known as: PRILOSEC Take 20 mg by mouth 2 (two) times daily before a meal.   OneTouch Delica Lancets 46F Misc   OneTouch Verio test strip Generic drug: glucose blood   oxyCODONE-acetaminophen 10-325 MG tablet Commonly known as: PERCOCET Take 1 tablet by mouth 2 (two) times daily as needed for pain.   Potassium Chloride ER 20 MEQ Tbcr Take 2 tablets by mouth daily.   ProAir HFA 108 (90 Base) MCG/ACT inhaler Generic drug: albuterol Inhale 2 puffs into the lungs every 4 (four) hours as needed for wheezing or shortness of breath. Uses as rescue inhaler Reported on 06/20/2015 What changed: additional instructions   promethazine 12.5 MG tablet Commonly known as: PHENERGAN Take 1 tablet (12.5 mg total) by mouth every 6 (six) hours as needed for nausea.   sacubitril-valsartan 49-51 MG Commonly known as: ENTRESTO Take 1 tablet by mouth 2 (two) times daily.   ticagrelor 90 MG Tabs tablet Commonly known as: BRILINTA Take 90 mg by mouth 2 (two) times daily.   VITAMIN C PO Take 1 tablet by mouth daily.            Durable Medical Equipment  (From admission, onward)  Start     Ordered   01/15/20 0000  For home use only DME 3 n 1        01/15/20 1015   01/12/20 1117  For home use only DME 4 wheeled rolling walker with seat  Once       Question:  Patient needs a walker to treat with the following condition  Answer:  Fear for personal safety   01/12/20 1116   01/12/20 1117  For home use only DME Bedside commode  Once       Question:  Patient needs a bedside commode to treat with the following condition  Answer:  Fear for personal safety   01/12/20 1116          Follow-up Information    [facility] Follow up.   Why: Please call if you have any questions or concerns about Equipment. Contact information: [facility] [address] [place], [place] [address] [phone]       Melida Quitter, MD Follow up on 02/01/2020.   Specialty: Otolaryngology Why: 1:30 PM  Contact information: [address] [place] [address] [phone]              Allergies  Allergen Reactions  . Fish-Derived Products Anaphylaxis  . Shellfish Allergy Shortness Of Breath    In dye for scan.  . Aspirin Nausea Only and Other (See Comments)    Pt stated it only gives her problems when she takes the high dose (325 mg)  . Benadryl [Diphenhydramine]     "flips out with it. Eyes roll to the back of her head."  . Ivp Dye [Iodinated Diagnostic Agents]     Hx of ? anaphlaxis  . Latex Other (See Comments)    Pt says she gets a "stinky infection"        Procedures/Studies: CT Head Wo Contrast  Result Date: 01/10/2020 CLINICAL DATA:  Dizziness, nonspecific; mental status change, unknown cause. EXAM: CT HEAD WITHOUT CONTRAST TECHNIQUE: Contiguous axial images were obtained from the base of the skull through the vertex without intravenous contrast. COMPARISON:  Prior head CT 12/14/2019.  Brain MRI 12/12/2019. FINDINGS: Brain: Mild ill-defined hypoattenuation within the cerebral white matter is nonspecific, but compatible with chronic small vessel ischemic disease. There is no acute intracranial hemorrhage. No demarcated cortical infarct. No extra-axial fluid collection. No evidence of intracranial mass. No midline shift. Partially empty sella turcica. Vascular: No hyperdense vessel.  Atherosclerotic calcifications. Skull: Normal. Negative for fracture or focal suspicious lesion. Sinuses/Orbits: Visualized orbits show no acute finding. Opacification of a right ethmoid air cell. Other: No significant mastoid effusion at the imaged levels. IMPRESSION: No evidence of acute intracranial abnormality. Mild cerebral white matter chronic small vessel ischemic disease. Mild right ethmoid sinusitis. Electronically Signed   By: Kellie Simmering DO   On: 01/10/2020 17:43   MR BRAIN WO  CONTRAST  Result Date: 01/10/2020 CLINICAL DATA:  Transient ischemic attack.  Dizziness. EXAM: MRI HEAD WITHOUT CONTRAST TECHNIQUE: Multiplanar, multiecho pulse sequences of the brain and surrounding structures were obtained without intravenous contrast. COMPARISON:  None. FINDINGS: Brain: No acute infarction, hemorrhage, hydrocephalus, extra-axial collection or mass lesion. Scattered foci of T2 hyperintensity are seen within the white matter of the cerebral hemispheres, nonspecific, most likely related to chronic small vessel ischemia. Vascular: Normal flow voids. Skull and upper cervical spine: Normal marrow signal. Sinuses/Orbits: Bilateral lens surgery. Mild mucosal thickening of ethmoid cells. Mild bilateral mastoid effusions. Other: None. IMPRESSION: 1. No acute  intracranial abnormality. 2. Mild chronic small vessel ischemic changes of the white matter. 3. Mild bilateral mastoid effusions. Electronically Signed   By: Pedro Earls M.D.   On: 01/10/2020 18:34   DG Chest Port 1 View  Result Date: 01/10/2020 CLINICAL DATA:  Weakness. EXAM: PORTABLE CHEST 1 VIEW COMPARISON:  12/13/2019 FINDINGS: Improved lung aeration from prior exam with decrease interstitial coarsening. Heart is normal in size. Rounded retrocardiac density is unchanged from prior exam and likely represents a hiatal hernia. Pulmonary vasculature is normal. No consolidation, pleural effusion, or pneumothorax. No acute osseous abnormalities are seen. IMPRESSION: 1. Improved lung aeration from prior exam.  No acute findings. 2. Probable hiatal hernia. Electronically Signed   By: Keith Rake M.D.   On: 01/10/2020 17:10   CT Renal Stone Study  Result Date: 01/10/2020 CLINICAL DATA:  Nausea, vomiting, weakness EXAM: CT ABDOMEN AND PELVIS WITHOUT CONTRAST TECHNIQUE: Multidetector CT imaging of the abdomen and pelvis was performed following the standard protocol without IV contrast. COMPARISON:  12/13/2019 FINDINGS: Lower  chest: No acute pleural or parenchymal lung disease. Moderate hiatal hernia. Hepatobiliary: No focal liver abnormality is seen. Status post cholecystectomy. No biliary dilatation. Pancreas: Unremarkable. No pancreatic ductal dilatation or surrounding inflammatory changes. Spleen: 1.1 cm hypodensity inferior margin of the spleen likely hemangioma based on ultrasound imaging. No other splenic abnormalities. Adrenals/Urinary Tract: 2.7 cm left renal cyst. Small area of fat attenuation ventral aspect left kidney measuring 0.6 cm, reference image 29 of series 2 most consistent with small angiomyolipoma. Right kidney is unremarkable. No urinary tract calculi or obstructive uropathy. The adrenals are normal. Bladder is decompressed, limiting evaluation. Stomach/Bowel: No bowel obstruction or ileus. Normal appendix right lower quadrant. No bowel wall thickening or inflammatory change. Vascular/Lymphatic: Aortic atherosclerosis. No enlarged abdominal or pelvic lymph nodes. Reproductive: Status post hysterectomy. No adnexal masses. Other: No free fluid or free gas. Small fat containing umbilical hernia. Musculoskeletal: No acute or destructive bony lesions. Reconstructed images demonstrate no additional findings. IMPRESSION: 1. No urinary tract calculi or obstructive uropathy. 2. Moderate hiatal hernia. 3. Fat containing umbilical hernia. 4.  Aortic Atherosclerosis (ICD10-I70.0). Electronically Signed   By: Randa Ngo M.D.   On: 01/10/2020 17:44      Subjective: Patient is feeling better, continue to have vertigo and double vision, but improved compared to admission. No nausea or vomiting and tolerating po well.   Discharge Exam: Vitals:   01/15/20 0605 01/15/20 0722  BP: 133/64   Pulse: 84   Resp:    Temp: 99.1 F (37.3 C)   SpO2: 100% 99%   Vitals:   01/14/20 1503 01/14/20 2007 01/15/20 0605 01/15/20 0722  BP: 113/69 (!) 119/95 133/64   Pulse: 71 75 84   Resp: 16 16    Temp: 98.4 F (36.9 C) 99.3  F (37.4 C) 99.1 F (37.3 C)   TempSrc: Oral Oral Oral   SpO2: 99% 94% 100% 99%  Weight:      Height:        General: Not in pain or dyspnea.  Neurology: Awake and alert, non focal  E ENT: mild pallor, no icterus, oral mucosa moist Cardiovascular: No JVD. S1-S2 present, rhythmic, no gallops, rubs, or murmurs. No lower extremity edema. Pulmonary: positive breath sounds bilaterally, with no wheezing, rhonchi or rales. Gastrointestinal. Abdomen soft and non tender Skin. No rashes Musculoskeletal: no joint deformities   The results of significant diagnostics from this hospitalization (including imaging, microbiology, ancillary and laboratory) are listed below  for reference.     Microbiology: Recent Results (from the past 240 hour(s))  Blood culture (routine x 2)     Status: None   Collection Time: 01/10/20  4:36 PM   Specimen: BLOOD  Result Value Ref Range Status   Specimen Description   Final    BLOOD LEFT ANTECUBITAL Performed at [facility] [address] [place], [place] [address]    Special Requests   Final    BOTTLES DRAWN AEROBIC AND ANAEROBIC Blood Culture results may not be optimal due to an excessive volume of blood received in culture bottles Performed at [facility] [address], [place], [place] [address]    Culture   Final    NO GROWTH 5 DAYS Performed at [facility], [place] [address] [place], [place] [address]    Report Status 01/15/2020 FINAL  Final  Urine Culture     Status: Abnormal   Collection Time: 01/10/20  4:37 PM   Specimen: Urine, Random  Result Value Ref Range Status   Specimen Description   Final    URINE, RANDOM Performed at [facility] [address], [place], [place] [address]    Special Requests   Final    NONE Performed at [facility], [place] [address], [place], [place] [address]    Culture MULTIPLE SPECIES PRESENT, SUGGEST RECOLLECTION (A)  Final    Report Status 01/11/2020 FINAL  Final  Blood culture (routine x 2)     Status: None   Collection Time: 01/10/20  4:38 PM   Specimen: BLOOD  Result Value Ref Range Status   Specimen Description   Final    BLOOD RIGHT ANTECUBITAL Performed at [facility] [address], [place], [place] [address]    Special Requests   Final    BOTTLES DRAWN AEROBIC AND ANAEROBIC Blood Culture adequate volume Performed at [facility] [address] [place], [place] [address]    Culture   Final    NO GROWTH 5 DAYS Performed at [facility], [place] [address], [place],  [address]    Report Status 01/15/2020 FINAL  Final     Labs: BNP (last 3 results) Recent Labs    10/16/19 1615 01/10/20 1626  BNP 504.9* 74.2   Basic Metabolic Panel: Recent Labs  Lab 01/10/20 1636 01/10/20 1636 01/10/20 1655 01/10/20 1845 01/11/20 0515 01/12/20 0537 01/13/20 0551 01/15/20 0749  NA 138   < > 139  --  137 138 140 137  K 2.6*   < > 2.9*  --  3.0* 3.6 3.7 3.9  CL 95*   < > 93*  --  101 107 108 102  CO2 27  --   --   --  26 21* 24 24  GLUCOSE 123*   < > 123*  --  103* 159* 122* 97  BUN 23   < > 26*  --  24* 8 <5* 7*  CREATININE 0.89   < > 0.80  --  0.99 0.59 0.63 0.65  CALCIUM 8.4*  --   --   --  7.6* 8.6* 9.6 9.7  MG  --   --   --  0.3* 1.2*  --  2.0  --    < > = values in this interval not displayed.   Liver Function Tests: Recent Labs  Lab 01/10/20 1636 01/11/20 0515  AST 16 12*  ALT 16 12  ALKPHOS  76 57  BILITOT 1.2 1.3*  PROT 8.3* 6.2*  ALBUMIN 4.1 3.1*   No results for input(s): LIPASE, AMYLASE in the last 168 hours. No results for input(s): AMMONIA in the last 168 hours. CBC: Recent Labs  Lab 01/10/20 1636 01/10/20 1655 01/11/20 0515 01/12/20 0537 01/13/20 0551  WBC 13.9*  --  8.8 9.8 6.7  NEUTROABS 10.0*  --   --  7.3 4.3  HGB 14.6 15.0 10.9* 12.2 10.9*  HCT 42.5 44.0 32.6* 36.2 33.2*  MCV 88.4  --  90.6 89.8 92.7  PLT  325  --  229 260 206   Cardiac Enzymes: No results for input(s): CKTOTAL, CKMB, CKMBINDEX, TROPONINI in the last 168 hours. BNP: Invalid input(s): POCBNP CBG: No results for input(s): GLUCAP in the last 168 hours. D-Dimer No results for input(s): DDIMER in the last 72 hours. Hgb A1c No results for input(s): HGBA1C in the last 72 hours. Lipid Profile No results for input(s): CHOL, HDL, LDLCALC, TRIG, CHOLHDL, LDLDIRECT in the last 72 hours. Thyroid function studies No results for input(s): TSH, T4TOTAL, T3FREE, THYROIDAB in the last 72 hours.  Invalid input(s): FREET3 Anemia work up No results for input(s): VITAMINB12, FOLATE, FERRITIN, TIBC, IRON, RETICCTPCT in the last 72 hours. Urinalysis    Component Value Date/Time   COLORURINE AMBER (A) 01/10/2020 1636   APPEARANCEUR CLOUDY (A) 01/10/2020 1636   LABSPEC 1.025 01/10/2020 1636   PHURINE 5.0 01/10/2020 1636   GLUCOSEU NEGATIVE 01/10/2020 1636   HGBUR SMALL (A) 01/10/2020 1636   BILIRUBINUR NEGATIVE 01/10/2020 1636   KETONESUR NEGATIVE 01/10/2020 1636   PROTEINUR 30 (A) 01/10/2020 1636   UROBILINOGEN 0.2 02/23/2014 1528   NITRITE NEGATIVE 01/10/2020 1636   LEUKOCYTESUR LARGE (A) 01/10/2020 1636   Sepsis Labs Invalid input(s): PROCALCITONIN,  WBC,  LACTICIDVEN Microbiology Recent Results (from the past 240 hour(s))  Blood culture (routine x 2)     Status: None   Collection Time: 01/10/20  4:36 PM   Specimen: BLOOD  Result Value Ref Range Status   Specimen Description   Final    BLOOD LEFT ANTECUBITAL Performed at [facility], [place] [address] [place], [place] [address]    Special Requests   Final    BOTTLES DRAWN AEROBIC AND ANAEROBIC Blood Culture results may not be optimal due to an excessive volume of blood received in culture bottles Performed at [facility] [address], [place], [place] [address]    Culture   Final    NO GROWTH 5 DAYS Performed at [facility], [place] [address], [place], [place] [address]    Report Status 01/15/2020 FINAL  Final  Urine Culture     Status: Abnormal   Collection Time: 01/10/20  4:37 PM   Specimen: Urine, Random  Result Value Ref Range Status   Specimen Description   Final    URINE, RANDOM Performed at [facility] [address] [place], [place] [address]    Special Requests   Final    NONE Performed at [facility], [place] [address], [place], [place] [address]    Culture MULTIPLE SPECIES PRESENT, SUGGEST RECOLLECTION (A)  Final   Report Status 01/11/2020 FINAL  Final  Blood culture (routine x 2)     Status: None   Collection Time: 01/10/20  4:38 PM   Specimen: BLOOD  Result Value Ref Range Status   Specimen Description   Final    BLOOD RIGHT ANTECUBITAL Performed  at [facility], [place] [address], [place], [place] [address]    Special Requests   Final    BOTTLES DRAWN AEROBIC AND ANAEROBIC Blood Culture adequate volume Performed at [facility] [address] [place], [place] [address]    Culture   Final    NO GROWTH 5 DAYS Performed at [facility], [place] [address], [place], [place] [address]    Report Status 01/15/2020 FINAL  Final     Time coordinating discharge: 45 minutes  SIGNED:   Tawni Millers, MD  [facility] 01/15/2020, 9:50 AM

## 2020-01-19 ENCOUNTER — Inpatient Hospital Stay: Payer: Medicare Other | Admitting: Internal Medicine

## 2020-01-22 NOTE — Progress Notes (Deleted)
Cardiology Office Note   Date:  01/22/2020   ID:  Nicole [name], DOB 03-06-1946, MRN [id number]  PCP:  Nicole Bowen, MD  Cardiologist:   Nicole Michalowski [place], MD   No chief complaint on file.     History of Present Illness: Nicole [name] is a 74 y.o. female who presents for follow up CHF. She has a PMH of coronary artery disease status post PCI, acute combined systolic and diastolic CHF, diabetes mellitus, depression, GERD, DOE, cardiomyopathy, and aphasia.  She was diagnosed with COVID-19 pneumonia 4/21.  She presented to the emergency department on 10/16/2019 with complaints of progressively worsening shortness of breath, DOE, and generalized weakness.  She indicated that she had not felt well since having COVID-19 pneumonia.  She continued to have dyspnea on exertion and chronic chest discomfort.  She indicated that her symptoms have progressed over the previous 2 weeks directing her to present to the emergency department.  On arrival to the ED her high-sensitivity troponins were slightly elevated.  Her EKG showed sinus tachycardia with left atrial enlargement and possible lateral injury.  Her CXR showed cardiomegaly.  She was also noted to have some ST elevation in V3-V6 and LVH.  Her echocardiogram 8 /7 showed an EF of 20-25%,Global hypokinesis, G2 DD.  She underwent cardiac catheterization on 10/21/2019 which showed first marginal 95% stenosed with no other significant stenosis. OM was treated with DES.   Post-cath she had acute onset of confusion and delirium.  She underwent a head CT which was normal.  Her mental status returned to normal a few hours post-cath. She was managed medically with DAPT, metoprolol, diuresis, Entresto and Farxiga. Seen in office 8/31 with improvement.   She was admitted in early October with acute severe sepsis. Unknown source. Had a troponin leak felt to be due to demand ischemia. Repeat Echo showed EF 25%. Managed again medically. She was readmitted on November 1  with urosepsis. Treated with antibiotics. Had vertigo symptoms with negative cranial CT and MRI. No active cardiac issues at that time.     Past Medical History:  Diagnosis Date  . Anemia   . Anxiety   . Back pain   . CAD (coronary artery disease) 10/22/2019   DES to [facility] August 2021  . Colon polyps   . COVID-28 June 2019  . Endometriosis   . Fibromyalgia   . GERD with esophagitis    Follows with Dr. Fuller Plan with GI, EGD in the past  . IBS (irritable bowel syndrome)   . Major depressive disorder   . Migraine   . Mild intermittent asthma   . Mixed diabetic hyperlipidemia associated with type 2 diabetes mellitus ([place])   . Obesity   . Type 2 diabetes mellitus ([place])   . Urinary incontinence     Past Surgical History:  Procedure Laterality Date  . ABDOMINAL HYSTERECTOMY     TAH BSO  . APPENDECTOMY  1999  . CATARACT EXTRACTION     both  . CHOLECYSTECTOMY    . CORONARY STENT INTERVENTION N/A 10/21/2019   Procedure: CORONARY STENT INTERVENTION;  Surgeon: Troy Sine, MD;  Location: [facility];  Service: Cardiovascular;  Laterality: N/A;  . DENTAL SURGERY    . HERNIA REPAIR  2000  . LAPAROSCOPIC ENDOMETRIOSIS FULGURATION    . RIGHT/LEFT HEART CATH AND CORONARY ANGIOGRAPHY N/A 10/21/2019   Procedure: RIGHT/LEFT HEART CATH AND CORONARY ANGIOGRAPHY;  Surgeon: Troy Sine, MD;  Location: [facility];  Service: Cardiovascular;  Laterality: N/A;     Current Outpatient Medications  Medication Sig Dispense Refill  . Ascorbic Acid (VITAMIN C PO) Take 1 tablet by mouth daily.    Marland Kitchen aspirin 81 MG tablet Take 81 mg by mouth daily.    Marland Kitchen atorvastatin (LIPITOR) 80 MG tablet Take 1 tablet (80 mg total) by mouth at bedtime. 30 tablet 11  . DULoxetine (CYMBALTA) 60 MG capsule Take 60 mg by mouth daily.    . ergocalciferol (VITAMIN D2) 50000 UNITS capsule Take 50,000 Units by mouth once a week.      . feeding supplement (ENSURE ENLIVE / ENSURE PLUS) LIQD Take 237 mLs by  mouth 2 (two) times daily between meals. 14220 mL 0  . fluticasone (FLONASE) 50 MCG/ACT nasal spray Place 1 spray into both nostrils daily as needed for allergies or rhinitis.     . Fluticasone-Salmeterol (ADVAIR DISKUS) 250-50 MCG/DOSE AEPB Inhale 1 puff into the lungs 2 (two) times daily.    . furosemide (LASIX) 40 MG tablet Take 1 tablet (40 mg total) by mouth daily. 30 tablet 0  . loratadine (CLARITIN) 10 MG tablet Take 1 tablet (10 mg total) by mouth daily. 30 tablet 0  . LORazepam (ATIVAN) 1 MG tablet Take 1 tablet (1 mg total) by mouth 2 (two) times daily. 3 tablet 0  . metoprolol succinate (TOPROL-XL) 50 MG 24 hr tablet Take 1 tablet (50 mg total) by mouth daily. Take with or immediately following a meal. 30 tablet 0  . omeprazole (PRILOSEC) 20 MG capsule Take 20 mg by mouth 2 (two) times daily before a meal.     . ONETOUCH DELICA LANCETS 14G MISC     . ONETOUCH VERIO test strip     . oxyCODONE-acetaminophen (PERCOCET) 10-325 MG tablet Take 1 tablet by mouth 2 (two) times daily as needed for pain. 3 tablet 0  . Potassium Chloride ER 20 MEQ TBCR Take 2 tablets by mouth daily.    Marland Kitchen PROAIR HFA 108 (90 Base) MCG/ACT inhaler Inhale 2 puffs into the lungs every 4 (four) hours as needed for wheezing or shortness of breath. Uses as rescue inhaler Reported on 06/20/2015 (Patient taking differently: Inhale 2 puffs into the lungs every 4 (four) hours as needed for wheezing or shortness of breath. Uses as rescue inhaler Reported on 10/16/2019) 6.7 g 0  . promethazine (PHENERGAN) 12.5 MG tablet Take 1 tablet (12.5 mg total) by mouth every 6 (six) hours as needed for nausea. 20 tablet 0  . sacubitril-valsartan (ENTRESTO) 49-51 MG Take 1 tablet by mouth 2 (two) times daily. 60 tablet 3  . ticagrelor (BRILINTA) 90 MG TABS tablet Take 90 mg by mouth 2 (two) times daily.     No current facility-administered medications for this visit.    Allergies:   Fish-derived products, Shellfish allergy, Aspirin, Benadryl  [diphenhydramine], Ivp dye [iodinated diagnostic agents], and Latex    Social History:  The patient  reports that she has never smoked. She has never used smokeless tobacco. She reports current alcohol use. She reports that she does not use drugs.   Family History:  The patient's ***family history includes Cancer in her brother, brother, paternal aunt, and paternal uncle; Diabetes in her daughter, father, and mother; Heart attack in her father; Heart failure in her father and mother; Hypertension in her father, maternal grandfather, maternal grandmother, mother, paternal grandfather, and paternal grandmother; Stroke in her father; Thyroid disease in her brother.    ROS:  Please see the history of present illness.   Otherwise, review of systems are positive for {NONE DEFAULTED:18576::"none"}.   All other systems are reviewed and negative.    PHYSICAL EXAM: VS:  There were no vitals taken for this visit. , BMI There is no height or weight on file to calculate BMI. GEN: Well nourished, well developed, in no acute distress  HEENT: normal  Neck: no JVD, carotid bruits, or masses Cardiac: ***RRR; no murmurs, rubs, or gallops,no edema  Respiratory:  clear to auscultation bilaterally, normal work of breathing GI: soft, nontender, nondistended, + BS MS: no deformity or atrophy  Skin: warm and dry, no rash Neuro:  Strength and sensation are intact Psych: euthymic mood, full affect   EKG:  EKG {ACTION; IS/IS YNW:[id number]} ordered today. The ekg ordered today demonstrates ***   Recent Labs: 01/10/2020: B Natriuretic Peptide 57.1 01/11/2020: ALT 12; TSH 0.945 01/13/2020: Hemoglobin 10.9; Magnesium 2.0; Platelets 206 01/15/2020: BUN 7; Creatinine, Ser 0.65; Potassium 3.9; Sodium 137    Lipid Panel    Component Value Date/Time   CHOL 68 12/16/2019 0237   TRIG 123 12/16/2019 0237   HDL 22 (L) 12/16/2019 0237   CHOLHDL 3.1 12/16/2019 0237   VLDL 25 12/16/2019 0237   LDLCALC 21 12/16/2019 0237       Wt Readings from Last 3 Encounters:  01/10/20 139 lb 8.8 oz (63.3 kg)  12/18/19 158 lb 1.1 oz (71.7 kg)  11/09/19 154 lb 9.6 oz (70.1 kg)      Other studies Reviewed: Additional studies/ records that were reviewed today include:   Cardiac cath/PCI 10/21/19:  CORONARY STENT INTERVENTION  RIGHT/LEFT HEART CATH AND CORONARY ANGIOGRAPHY  Conclusion    1st Mrg lesion is 95% stenosed.  Post intervention, there is a 0% residual stenosis.  Prox RCA to Mid RCA lesion is 20% stenosed.  Prox LAD lesion is 20% stenosed.  Mid LAD lesion is 40% stenosed.  A stent was successfully placed.   Coronary obstructive disease with 20% proximal and 40% smooth mid LAD stenoses; 30% proximal circumflex stenosis with 95% near ostial stenosis in a bifurcating OM1 vessel; and 20% mid RCA narrowing.  Normal to minimally increased right heart pressures; mean PA pressure 21 mmHg  Successful PCI to 95% very proximal circumflex marginal stenosis with insertion of a 2.25 x 8 mm DES stent with the stenosis being reduced to 0%.  RECOMMENDATION: Recommend DAPT for 12 months.  Guideline directed medical therapy for the patient's significant cardiomyopathy which is out of proportion to her high-grade circumflex marginal stenosis.  Aggressive lipid-lowering therapy with target LDL less than 70.  Echo 12/13/19: IMPRESSIONS    1. EF similar to August diffuse hypokinesis worse in the septum , apex  and inferior walls Basal lateral wall only area with preserved function .  Left ventricular ejection fraction, by estimation, is 25 to 30%. The left  ventricle has severely decreased  function. The left ventricle demonstrates regional wall motion  abnormalities (see scoring diagram/findings for description). The left  ventricular internal cavity size was moderately dilated. Left ventricular  diastolic parameters were normal.  2. Right ventricular systolic function was not well visualized. The right   ventricular size is not well visualized. There is mildly elevated  pulmonary artery systolic pressure.  3. The mitral valve is normal in structure. Trivial mitral valve  regurgitation. No evidence of mitral stenosis.  4. Tricuspid valve regurgitation is moderate.  5. The aortic valve is normal in structure. Aortic valve regurgitation is  not visualized. Mild to moderate aortic valve sclerosis/calcification is  present, without any evidence of aortic stenosis.  6. The inferior vena cava is normal in size with greater than 50%  respiratory variability, suggesting right atrial pressure of 3 mmHg.    ASSESSMENT AND PLAN:  1. 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 Appears to be predominantly nonischemic.     Underwent cardiac catheterization 10/21/2019 which showed 95% OM lesion was treated with PCI/DES x1, 20% medial RCA, 20% proximal LAD had 40% medial LAD.  It was felt that her cardiomyopathy was out of proportion to her high-grade OM stenosis.  Persistent low EF by Echo in October.  Continue Entresto, metoprolol, furosemide Heart healthy low-sodium diet-salty 6 given Increase physical activity as tolerated Plan to repeat echocardiogram in 3 months to evaluate for improvement in EF with plans to evaluate for ICD if EF remains low.  Will need follow-up with EP. Order BMP  2. Coronary artery disease-s/p DES of OM 1 in August. no chest pain today.  Status post cardiac catheterization 10/21/2019.which showed 95% OM lesion was treated with PCI/DES x1, 20% medial RCA, 20% proximal LAD had 40% medial LAD.  Continue aspirin, Brilinta, metoprolol, atorvastatin Heart healthy low-sodium diet-salty 6 given Increase physical activity as tolerated  3. Essential hypertension-BP today 118/68 today.  Well-controlled at home. Continue metoprolol Heart healthy low-sodium diet-salty 6 given Increase physical activity as tolerated  4. Type 2 diabetes-A1c 6.5 on recent hospital  admission. Continue Farxiga Heart healthy low-sodium carb modified diet Increase physical activity as tolerated Followed by PCP  5. Recurrent urosepsis.    Current medicines are reviewed at length with the patient today.  The patient {ACTIONS; HAS/DOES NOT HAVE:19233} concerns regarding medicines.  The following changes have been made:  {PLAN; NO CHANGE:13088:s}  Labs/ tests ordered today include: *** No orders of the defined types were placed in this encounter.    Disposition:   FU with *** in {gen number 5-85:929244} {Days to years:10300}  Signed, Harrison Paulson Martinique, MD  01/22/2020 10:39 AM    [facility] [address] [place], [place], [address] Phone [phone], Fax [phone]

## 2020-01-24 ENCOUNTER — Ambulatory Visit: Payer: Medicare Other | Admitting: Cardiology

## 2020-01-25 ENCOUNTER — Telehealth: Payer: Self-pay | Admitting: Cardiology

## 2020-01-25 NOTE — Telephone Encounter (Signed)
Pt daughter called and stated pt was discharged for rehab and after rehab pt went back to hosp.  She is needing a in office visit to fu.  There is none available .  Pt would like to be seen in office this month   Best number [phone]

## 2020-01-25 NOTE — Telephone Encounter (Signed)
Spoke to patient's daughter [name] stated her mother is home from rehab.She only wants to see Dr.Jordan.Post hosp appointment scheduled with Dr.Jordan 11/22 at 4:30 pm.

## 2020-01-29 NOTE — Progress Notes (Signed)
Cardiology Office Note   Date:  01/31/2020   ID:  SALLY-ANN CUTBIRTH, DOB 1945-06-11, MRN [id number]  PCP:  [name], MD  Cardiologist:   Pamela Maddy Martinique, MD   Chief Complaint  Patient presents with   Congestive Heart Failure      History of Present Illness: KEYAIRA CLAPHAM is a 74 y.o. female who presents for follow up CHF. She has a PMH of coronary artery disease status post PCI, acute combined systolic and diastolic CHF, diabetes mellitus, depression, GERD, DOE, cardiomyopathy, and aphasia.  She was diagnosed with COVID-19 pneumonia 4/21.  She presented to the emergency department on 10/16/2019 with complaints of progressively worsening shortness of breath, DOE, and generalized weakness.  She indicated that she had not felt well since having COVID-19 pneumonia.  She continued to have dyspnea on exertion and chronic chest discomfort.  She indicated that her symptoms have progressed over the previous 2 weeks directing her to present to the emergency department.  On arrival to the ED her high-sensitivity troponins were slightly elevated.  Her EKG showed sinus tachycardia with left atrial enlargement and possible lateral injury.  Her CXR showed cardiomegaly.  She was also noted to have some ST elevation in V3-V6 and LVH.  Her echocardiogram 8 /7 showed an EF of 20-25%,Global hypokinesis, G2 DD.  She underwent cardiac catheterization on 10/21/2019 which showed first marginal 95% stenosed with no other significant stenosis. OM was treated with DES.   Post-cath she had acute onset of confusion and delirium.  She underwent a head CT which was normal.  Her mental status returned to normal a few hours post-cath. She was managed medically with DAPT, metoprolol, diuresis, Entresto and Farxiga. Seen in office 8/31 with improvement.   She was admitted in early October with acute severe sepsis. Unknown source. Had a troponin leak felt to be due to demand ischemia. Repeat Echo showed EF 25-30%. Managed again  medically. Entresto and Toprol doses increased. at some point Wilder Glade was held.  She was readmitted on November 1 with urosepsis. Treated with antibiotics. Had vertigo symptoms with negative cranial CT and MRI.   On follow up today she is seen with her daughter. She has persistent symptoms of dizziness and nausea with some blurred vision. Is scheduled to see Dr Redmond Baseman for possible vertigo. She denies any swelling, dyspnea or chest pain. Weight is down 10 lbs.     Past Medical History:  Diagnosis Date   Anemia    Anxiety    Back pain    CAD (coronary artery disease) 10/22/2019   DES to [facility] August 2021   Colon polyps    COVID-28 June 2019   Endometriosis    Fibromyalgia    GERD with esophagitis    Follows with Dr. 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 mellitus ([place])    Obesity    Type 2 diabetes mellitus ([place])    Urinary incontinence     Past Surgical History:  Procedure Laterality Date   ABDOMINAL HYSTERECTOMY     TAH BSO   APPENDECTOMY  1999   CATARACT EXTRACTION     both   CHOLECYSTECTOMY     CORONARY STENT INTERVENTION N/A 10/21/2019   Procedure: CORONARY STENT INTERVENTION;  Surgeon: Troy Sine, MD;  Location:  [place] [facility] [facility];  Service: Cardiovascular;  Laterality: N/A;   DENTAL SURGERY     HERNIA REPAIR  2000   LAPAROSCOPIC ENDOMETRIOSIS FULGURATION     RIGHT/LEFT HEART CATH AND CORONARY ANGIOGRAPHY N/A 10/21/2019   Procedure: RIGHT/LEFT HEART CATH AND CORONARY ANGIOGRAPHY;  Surgeon: Troy Sine, MD;  Location: [facility];  Service: Cardiovascular;  Laterality: N/A;     Current Outpatient Medications  Medication Sig Dispense Refill   Ascorbic Acid (VITAMIN C PO) Take 1 tablet by mouth daily.     aspirin 81 MG tablet Take 81 mg by mouth daily.      DULoxetine (CYMBALTA) 60 MG capsule Take 60 mg by mouth daily.     ergocalciferol (VITAMIN D2) 50000 UNITS capsule Take 50,000 Units by mouth once a week.       feeding supplement (ENSURE ENLIVE / ENSURE PLUS) LIQD Take 237 mLs by mouth 2 (two) times daily between meals. 14220 mL 0   fluticasone (FLONASE) 50 MCG/ACT nasal spray Place 1 spray into both nostrils daily as needed for allergies or rhinitis.      Fluticasone-Salmeterol (ADVAIR DISKUS) 250-50 MCG/DOSE AEPB Inhale 1 puff into the lungs 2 (two) times daily.     furosemide (LASIX) 40 MG tablet Take 1 tablet (40 mg total) by mouth daily. 30 tablet 0   loratadine (CLARITIN) 10 MG tablet Take 1 tablet (10 mg total) by mouth daily. 30 tablet 0   LORazepam (ATIVAN) 1 MG tablet Take 1 tablet (1 mg total) by mouth 2 (two) times daily. 3 tablet 0   metoprolol succinate (TOPROL-XL) 50 MG 24 hr tablet Take 1 tablet (50 mg total) by mouth daily. Take with or immediately following a meal. 30 tablet 0   omeprazole (PRILOSEC) 20 MG capsule Take 20 mg by mouth 2 (two) times daily before a meal.      ONETOUCH DELICA LANCETS 03K MISC      ONETOUCH VERIO test strip      oxyCODONE-acetaminophen (PERCOCET) 10-325 MG tablet Take 1 tablet by mouth 2 (two) times daily as needed for pain. 3 tablet 0   Potassium Chloride ER 20 MEQ TBCR Take 1 tablet by mouth daily.     PROAIR HFA 108 (90 Base) MCG/ACT inhaler Inhale 2 puffs into the lungs every 4 (four) hours as needed for wheezing or shortness of breath. Uses as rescue inhaler Reported on 06/20/2015 (Patient taking differently: Inhale 2 puffs into the lungs every 4 (four) hours as needed for wheezing or shortness of breath. Uses as rescue inhaler Reported on 10/16/2019) 6.7 g 0   promethazine (PHENERGAN) 12.5 MG tablet Take 1 tablet (12.5 mg total) by mouth every 6 (six) hours as needed for nausea. 20 tablet 0   sacubitril-valsartan (ENTRESTO) 49-51 MG Take 1 tablet by mouth 2 (two) times daily. 60  tablet 3   ticagrelor (BRILINTA) 90 MG TABS tablet Take 90 mg by mouth 2 (two) times daily.     atorvastatin (LIPITOR) 80 MG tablet Take 1 tablet (80 mg total) by mouth at bedtime. 30 tablet 11   spironolactone (ALDACTONE) 25 MG tablet Take 1 tablet (25 mg total) by mouth daily. 90 tablet 3   No current facility-administered medications for this visit.    Allergies:   Fish-derived products, Shellfish allergy, Aspirin, Benadryl [diphenhydramine], Ivp dye [iodinated diagnostic agents], and Latex    Social History:  The patient  reports that she has never smoked. She has never used smokeless tobacco. She reports  current alcohol use. She reports that she does not use drugs.   Family History:  The patient's family history includes Cancer in her brother, brother, paternal aunt, and paternal uncle; Diabetes in her daughter, father, and mother; Heart attack in her father; Heart failure in her father and mother; Hypertension in her father, maternal grandfather, maternal grandmother, mother, paternal grandfather, and paternal grandmother; Stroke in her father; Thyroid disease in her brother.    ROS:  Please see the history of present illness.   Otherwise, review of systems are positive for none.   All other systems are reviewed and negative.    PHYSICAL EXAM: VS:  BP 121/72    Pulse 86    Temp (!) 97 F (36.1 C)    Ht 5\' 2"  (1.575 m)    Wt 144 lb 3.2 oz (65.4 kg)    SpO2 96%    BMI 26.37 kg/m  , BMI Body mass index is 26.37 kg/m. GEN: Well nourished, well developed, in no acute distress  HEENT: normal  Neck: no JVD, carotid bruits, or masses Cardiac: RRR; no murmurs, rubs, or gallops,no edema  Respiratory:  clear to auscultation bilaterally, normal work of breathing GI: soft, nontender, nondistended, + BS MS: no deformity or atrophy  Skin: warm and dry, no rash Neuro:  Strength and sensation are intact Psych: euthymic mood, full affect   EKG:  EKG is not ordered today. The ekg ordered  today demonstrates N/A   Recent Labs: 01/10/2020: B Natriuretic Peptide 57.1 01/11/2020: ALT 12; TSH 0.945 01/13/2020: Hemoglobin 10.9; Magnesium 2.0; Platelets 206 01/15/2020: BUN 7; Creatinine, Ser 0.65; Potassium 3.9; Sodium 137    Lipid Panel    Component Value Date/Time   CHOL 68 12/16/2019 0237   TRIG 123 12/16/2019 0237   HDL 22 (L) 12/16/2019 0237   CHOLHDL 3.1 12/16/2019 0237   VLDL 25 12/16/2019 0237   LDLCALC 21 12/16/2019 0237      Wt Readings from Last 3 Encounters:  01/31/20 144 lb 3.2 oz (65.4 kg)  01/10/20 139 lb 8.8 oz (63.3 kg)  12/18/19 158 lb 1.1 oz (71.7 kg)      Other studies Reviewed: Additional studies/ records that were reviewed today include:   Cardiac cath/PCI 10/21/19:  CORONARY STENT INTERVENTION  RIGHT/LEFT HEART CATH AND CORONARY ANGIOGRAPHY  Conclusion    1st Mrg lesion is 95% stenosed.  Post intervention, there is a 0% residual stenosis.  Prox RCA to Mid RCA lesion is 20% stenosed.  Prox LAD lesion is 20% stenosed.  Mid LAD lesion is 40% stenosed.  A stent was successfully placed.   Coronary obstructive disease with 20% proximal and 40% smooth mid LAD stenoses; 30% proximal circumflex stenosis with 95% near ostial stenosis in a bifurcating OM1 vessel; and 20% mid RCA narrowing.  Normal to minimally increased right heart pressures; mean PA pressure 21 mmHg  Successful PCI to 95% very proximal circumflex marginal stenosis with insertion of a 2.25 x 8 mm DES stent with the stenosis being reduced to 0%.  RECOMMENDATION: Recommend DAPT for 12 months.  Guideline directed medical therapy for the patient's significant cardiomyopathy which is out of proportion to her high-grade circumflex marginal stenosis.  Aggressive lipid-lowering therapy with target LDL less than 70.  Echo 12/13/19: IMPRESSIONS    1. EF similar to August diffuse hypokinesis worse in the septum , apex  and inferior walls Basal lateral wall only area with  preserved function .  Left ventricular ejection fraction, by estimation, is 25  to 30%. The left  ventricle has severely decreased  function. The left ventricle demonstrates regional wall motion  abnormalities (see scoring diagram/findings for description). The left  ventricular internal cavity size was moderately dilated. Left ventricular  diastolic parameters were normal.  2. Right ventricular systolic function was not well visualized. The right  ventricular size is not well visualized. There is mildly elevated  pulmonary artery systolic pressure.  3. The mitral valve is normal in structure. Trivial mitral valve  regurgitation. No evidence of mitral stenosis.  4. Tricuspid valve regurgitation is moderate.  5. The aortic valve is normal in structure. Aortic valve regurgitation is  not visualized. Mild to moderate aortic valve sclerosis/calcification is  present, without any evidence of aortic stenosis.  6. The inferior vena cava is normal in size with greater than 50%  respiratory variability, suggesting right atrial pressure of 3 mmHg.    ASSESSMENT AND PLAN:  1. Chronic combined systolic/diastolic CHF-EF 87-86%. Appears to be predominantly nonischemic.     Underwent cardiac catheterization 10/21/2019 which showed 95% OM lesion was treated with PCI/DES x1, 20% medial RCA, 20% proximal LAD had 40% medial LAD.  It was felt that her cardiomyopathy was out of proportion to her high-grade OM stenosis.  Persistent low EF by Echo in October but had recurrent urosepsis then.  Continue Entresto, metoprolol, furosemide Will add aldactone 25 mg daily Check BMET 1 week Reduce potassium supplement to once a day. May be able to discontinue if OK on aldactone. Heart healthy low-sodium diet-salty 6 given Increase physical activity as tolerated Will have her follow up with pharm D in one month to see if we can titrate CHF meds further.   2. Coronary artery disease-s/p DES of OM 1 in August. no  chest pain today.  Status post cardiac catheterization 10/21/2019.which showed 95% OM lesion was treated with PCI/DES x1, 20% medial RCA, 20% proximal LAD had 40% medial LAD.  Continue aspirin and Brilinta for one year continue metoprolol, atorvastatin Heart healthy low-sodium diet-salty 6 given Increase physical activity as tolerated  3. Essential hypertension-BP well controlled. Continue metoprolol, entresto.    4. Type 2 diabetes-A1c 6.5 on recent hospital admission. Will hold off on Farxiga for now until nausea and dizziness improved.  Heart healthy low-sodium carb modified diet Increase physical activity as tolerated Followed by PCP  5. Recurrent urosepsis.    Current medicines are reviewed at length with the patient today.  The patient does not have concerns regarding medicines.  The following changes have been made:  See above  Labs/ tests ordered today include:   Orders Placed This Encounter  Procedures   Basic metabolic panel     Disposition:   FU with me in 3 months. With Pharm D in one month.  Signed, Rabecca Birge Martinique, MD  01/31/2020 5:05 PM    [facility] [address], [place], [place], [address] Phone [phone], Fax [phone]

## 2020-01-31 ENCOUNTER — Other Ambulatory Visit: Payer: Self-pay

## 2020-01-31 ENCOUNTER — Ambulatory Visit (INDEPENDENT_AMBULATORY_CARE_PROVIDER_SITE_OTHER): Payer: Medicare Other | Admitting: Cardiology

## 2020-01-31 ENCOUNTER — Encounter: Payer: Self-pay | Admitting: Cardiology

## 2020-01-31 VITALS — BP 121/72 | HR 86 | Temp 97.0°F | Ht 62.0 in | Wt 144.2 lb

## 2020-01-31 DIAGNOSIS — I5022 Chronic systolic (congestive) heart failure: Secondary | ICD-10-CM | POA: Diagnosis not present

## 2020-01-31 DIAGNOSIS — I2510 Atherosclerotic heart disease of native coronary artery without angina pectoris: Secondary | ICD-10-CM

## 2020-01-31 DIAGNOSIS — E119 Type 2 diabetes mellitus without complications: Secondary | ICD-10-CM | POA: Diagnosis not present

## 2020-01-31 DIAGNOSIS — I10 Essential (primary) hypertension: Secondary | ICD-10-CM | POA: Diagnosis not present

## 2020-01-31 MED ORDER — SPIRONOLACTONE 25 MG PO TABS: 25.0000 mg | ORAL_TABLET | Freq: Every day | ORAL | 3 refills | Status: DC

## 2020-01-31 NOTE — Patient Instructions (Signed)
Reduce potassium to 20 meq once a day  Add aldactone 25 mg daily  Repeat renal panel later this week.  Follow up in one month.

## 2020-02-01 ENCOUNTER — Telehealth: Payer: Self-pay | Admitting: Cardiology

## 2020-02-01 DIAGNOSIS — R42 Dizziness and giddiness: Secondary | ICD-10-CM | POA: Insufficient documentation

## 2020-02-01 NOTE — Telephone Encounter (Signed)
Left message for patient to call and schedule Pharm D visit as ordered by Dr. Martinique

## 2020-02-08 LAB — BASIC METABOLIC PANEL
BUN/Creatinine Ratio: 14 (ref 12–28)
BUN: 13 mg/dL (ref 8–27)
CO2: 27 mmol/L (ref 20–29)
Calcium: 9.2 mg/dL (ref 8.7–10.3)
Chloride: 98 mmol/L (ref 96–106)
Creatinine, Ser: 0.95 mg/dL (ref 0.57–1.00)
GFR calc Af Amer: 69 mL/min/{1.73_m2} (ref >59)
GFR calc non Af Amer: 60 mL/min/{1.73_m2} (ref >59)
Glucose: 174 mg/dL — ABNORMAL HIGH (ref 65–99)
Potassium: 5 mmol/L (ref 3.5–5.2)
Sodium: 140 mmol/L (ref 134–144)

## 2020-02-29 ENCOUNTER — Telehealth: Payer: Self-pay | Admitting: Cardiology

## 2020-02-29 MED ORDER — FUROSEMIDE 40 MG PO TABS
40.0000 mg | ORAL_TABLET | Freq: Every day | ORAL | 3 refills | Status: DC
Start: 2020-02-29 — End: 2020-09-04

## 2020-02-29 NOTE — Telephone Encounter (Signed)
*  STAT* If patient is at the pharmacy, call can be transferred to refill team.   1. Which medications need to be refilled? (please list name of each medication and dose if known) need new prescriptions for Metoprolol and Furosemide  2. Which pharmacy/location (including street and city if local pharmacy) is medication to be sent to? [facility] [address], [place]  3. Do they need a 30 day or 90 day supply? 90 days and refills

## 2020-03-01 ENCOUNTER — Other Ambulatory Visit: Payer: Self-pay

## 2020-03-01 ENCOUNTER — Telehealth: Payer: Self-pay | Admitting: Cardiology

## 2020-03-01 MED ORDER — METOPROLOL SUCCINATE ER 50 MG PO TB24
50.0000 mg | ORAL_TABLET | Freq: Every day | ORAL | 3 refills | Status: DC
Start: 2020-03-01 — End: 2021-01-24

## 2020-03-01 NOTE — Telephone Encounter (Signed)
*  STAT* If patient is at the pharmacy, call can be transferred to refill team.   1. Which medications need to be refilled? (please list name of each medication and dose if known)  Daughter said Metoprolol was not at the pharmacy  2. Which pharmacy/location (including street and city if local pharmacy) is medication to be sent to? [facility] [address]   3. Do they need a 30 day or 90 day supply? 90 days and refills

## 2020-03-07 ENCOUNTER — Other Ambulatory Visit: Payer: Self-pay | Admitting: Pharmacist

## 2020-03-07 MED ORDER — SACUBITRIL-VALSARTAN 49-51 MG PO TABS: 1.0000 | ORAL_TABLET | Freq: Two times a day (BID) | ORAL | 2 refills | Status: DC

## 2020-03-09 ENCOUNTER — Ambulatory Visit: Payer: Medicare Other

## 2020-03-29 ENCOUNTER — Ambulatory Visit: Payer: Medicare Other

## 2020-03-29 NOTE — Progress Notes (Deleted)
Patient ID: MCKINZE POIRIER                 DOB: 1945-08-08                      MRN: [id number]     HPI:  Nicole [name] is a 75 y.o. female referred by Dr. Martinique to HTN clinic. PMH includes HFrEF, CAD, cardiomyopathy, DM-2, hyperlipidemia, and GERD. She was managed medically with DAPT, metoprolol, diuresis, Entresto and Farxiga. Per DR Doug Sou note "will hold off on Farxiga until nausea and dizziness improved". Patient presents for further medication titration. Noted recent BMET shows K=5 and Scr within normal limits.   Current HTN meds:  Furosemide 40mg  daily Metoprolol succinate 50mg  daily Entresto 49-51mg  twice daily Spironolactone 25mg  daily  Previously tried:   BP goal: <130/80  Family History: The patient's family history includes Cancer in her brother, brother, paternal aunt, and paternal uncle; Diabetes in her daughter, father, and mother; Heart attack in her father; Heart failure in her father and mother; Hypertension in her father, maternal grandfather, maternal grandmother, mother, paternal grandfather, and paternal grandmother; Stroke in her father; Thyroid disease in her brother.   Social History: The patient  reports that she has never smoked. She has never used smokeless tobacco. She reports current alcohol use. She reports that she does not use drugs.   Diet:   Exercise:   Home BP readings:   Wt Readings from Last 3 Encounters:  01/31/20 144 lb 3.2 oz (65.4 kg)  01/10/20 139 lb 8.8 oz (63.3 kg)  12/18/19 158 lb 1.1 oz (71.7 kg)   BP Readings from Last 3 Encounters:  01/31/20 121/72  01/15/20 111/80  12/18/19 (!) 120/100   Pulse Readings from Last 3 Encounters:  01/31/20 86  01/15/20 86  12/18/19 (!) 47    Past Medical History:  Diagnosis Date  . Anemia   . Anxiety   . Back pain   . CAD (coronary artery disease) 10/22/2019   DES to [facility] August 2021  . Colon polyps   . COVID-28 June 2019  . Endometriosis   . Fibromyalgia   . GERD with  esophagitis    Follows with Dr. Fuller Plan with GI, EGD in the past  . IBS (irritable bowel syndrome)   . Major depressive disorder   . Migraine   . Mild intermittent asthma   . Mixed diabetic hyperlipidemia associated with type 2 diabetes mellitus ([place])   . Obesity   . Type 2 diabetes mellitus ([place])   . Urinary incontinence     Current Outpatient Medications on File Prior to Visit  Medication Sig Dispense Refill  . Ascorbic Acid (VITAMIN C PO) Take 1 tablet by mouth daily.    Marland Kitchen aspirin 81 MG tablet Take 81 mg by mouth daily.    Marland Kitchen atorvastatin (LIPITOR) 80 MG tablet Take 1 tablet (80 mg total) by mouth at bedtime. 30 tablet 11  . DULoxetine (CYMBALTA) 60 MG capsule Take 60 mg by mouth daily.    . ergocalciferol (VITAMIN D2) 50000 UNITS capsule Take 50,000 Units by mouth once a week.      . fluticasone (FLONASE) 50 MCG/ACT nasal spray Place 1 spray into both nostrils daily as needed for allergies or rhinitis.     . Fluticasone-Salmeterol (ADVAIR DISKUS) 250-50 MCG/DOSE AEPB Inhale 1 puff into the lungs 2 (two) times daily.    . furosemide (LASIX) 40 MG tablet Take 1 tablet (40  mg total) by mouth daily. 90 tablet 3  . loratadine (CLARITIN) 10 MG tablet Take 1 tablet (10 mg total) by mouth daily. 30 tablet 0  . LORazepam (ATIVAN) 1 MG tablet Take 1 tablet (1 mg total) by mouth 2 (two) times daily. 3 tablet 0  . metoprolol succinate (TOPROL-XL) 50 MG 24 hr tablet Take 1 tablet (50 mg total) by mouth daily. Take with or immediately following a meal. 90 tablet 3  . omeprazole (PRILOSEC) 20 MG capsule Take 20 mg by mouth 2 (two) times daily before a meal.     . ONETOUCH DELICA LANCETS 50D MISC     . ONETOUCH VERIO test strip     . oxyCODONE-acetaminophen (PERCOCET) 10-325 MG tablet Take 1 tablet by mouth 2 (two) times daily as needed for pain. 3 tablet 0  . Potassium Chloride ER 20 MEQ TBCR Take 1 tablet by mouth daily.    Marland Kitchen PROAIR HFA 108 (90 Base) MCG/ACT inhaler Inhale 2 puffs into the lungs  every 4 (four) hours as needed for wheezing or shortness of breath. Uses as rescue inhaler Reported on 06/20/2015 (Patient taking differently: Inhale 2 puffs into the lungs every 4 (four) hours as needed for wheezing or shortness of breath. Uses as rescue inhaler Reported on 10/16/2019) 6.7 g 0  . promethazine (PHENERGAN) 12.5 MG tablet Take 1 tablet (12.5 mg total) by mouth every 6 (six) hours as needed for nausea. 20 tablet 0  . sacubitril-valsartan (ENTRESTO) 49-51 MG Take 1 tablet by mouth 2 (two) times daily. 60 tablet 2  . spironolactone (ALDACTONE) 25 MG tablet Take 1 tablet (25 mg total) by mouth daily. 90 tablet 3  . ticagrelor (BRILINTA) 90 MG TABS tablet Take 90 mg by mouth 2 (two) times daily.     No current facility-administered medications on file prior to visit.    Allergies  Allergen Reactions  . Fish-Derived Products Anaphylaxis  . Shellfish Allergy Shortness Of Breath    In dye for scan.  . Aspirin Nausea Only and Other (See Comments)    Pt stated it only gives her problems when she takes the high dose (325 mg)  . Benadryl [Diphenhydramine]     "flips out with it. Eyes roll to the back of her head."  . Ivp Dye [Iodinated Diagnostic Agents]     Hx of ? anaphlaxis  . Latex Other (See Comments)    Pt says she gets a "stinky infection"    There were no vitals taken for this visit.  No problem-specific Assessment & Plan notes found for this encounter.   Dmani Mizer Rodriguez-Guzman PharmD, BCPS, CPP [place] [id number] 03/29/2020 11:41 AM

## 2020-04-04 ENCOUNTER — Ambulatory Visit (INDEPENDENT_AMBULATORY_CARE_PROVIDER_SITE_OTHER): Payer: Medicare Other | Admitting: Pharmacist Clinician (PhC)/ Clinical Pharmacy Specialist

## 2020-04-04 ENCOUNTER — Other Ambulatory Visit: Payer: Self-pay

## 2020-04-04 VITALS — BP 132/68 | HR 82 | Ht 62.0 in | Wt 145.0 lb

## 2020-04-04 DIAGNOSIS — I5041 Acute combined systolic (congestive) and diastolic (congestive) heart failure: Secondary | ICD-10-CM | POA: Diagnosis not present

## 2020-04-04 DIAGNOSIS — I5022 Chronic systolic (congestive) heart failure: Secondary | ICD-10-CM | POA: Diagnosis not present

## 2020-04-04 MED ORDER — ENTRESTO 49-51 MG PO TABS: 1.0000 | ORAL_TABLET | Freq: Two times a day (BID) | ORAL | 0 refills | Status: DC

## 2020-04-04 NOTE — Patient Instructions (Addendum)
Return for a a follow up appointment with Dr. Martinique on Feb 9   Go to the lab in 2 weeks (week of Feb 7) to check potassium level and kidney function  Check your blood pressure at home daily  and keep record of the readings.  Call office in a week to let us know that you are tolerating the higher dose of Entresto.  Satoshi Kalas/Raquel at [phone].  Take your BP meds as follows:  Increase Entresto 49/51 mg to 2 tablets twice daily.  If you do well with this for the next 7-10 days we will send in a prescription to your pharmacy.    Continue all other medications  Bring all of your meds, your BP cuff and your record of home blood pressures to your next appointment.  Exercise as you're able, try to walk approximately 30 minutes per day.  Keep salt intake to a minimum, especially watch canned and prepared boxed foods.  Eat more fresh fruits and vegetables and fewer canned items.  Avoid eating in fast food restaurants.    HOW TO TAKE YOUR BLOOD PRESSURE: . Rest 5 minutes before taking your blood pressure. .  Don't smoke or drink caffeinated beverages for at least 30 minutes before. . Take your blood pressure before (not after) you eat. . Sit comfortably with your back supported and both feet on the floor (don't cross your legs). . Elevate your arm to heart level on a table or a desk. . Use the proper sized cuff. It should fit smoothly and snugly around your bare upper arm. There should be enough room to slip a fingertip under the cuff. The bottom edge of the cuff should be 1 inch above the crease of the elbow. . Ideally, take 3 measurements at one sitting and record the average.

## 2020-04-04 NOTE — Progress Notes (Signed)
04/05/2020 Nicole [name] [birth year] [id number]   HPI:  Nicole [name] is a 75 y.o. female patient of Dr Martinique, with a Nicholson below who presents today for heart failure medication titration.   Patient believes heart failure developed as result of COVID, which she had earlier in 2021.  Echocardiogram in October showed EF to be low at 25-30%, only up slightly from 20-25% in August. She was started on Entresto 24/26 mg in August and titrated up to 49/51 mg in October.  She saw Dr. Martinique in November and was found to have a blood pressure of 121/72.  He added spironolactone to her regimen and labs one week later showed potassium level to be at 5.0.    Today she returns for medication titration.  She is feeling well overall, although has some issues with feeling dizzy.  This doesn't seem to be directly tied to blood pressure levels, as none of her home readings appear low enough to cause hypotensive symptoms.  Her daughter is with her today, and keeps track of her mother's medications.    Past Medical History: ASCVD 8/21 - DES to OM lesion - on metoprolol, ticagrelor, aspirin  hypertension Controlled on spironolactone, Entresto, metoprolol  hyperlipidemia 10/21 LDL 21 - on atorvastatin 80  DM2 8/21 A1c 6.5 - no medications  urosepsis Admitted x 2 Oct/Nov     Blood Pressure Goal:  130/80  Current Medications: Entresto 49/51 mg bid, spironolactone 25 mg qd, metoprolol succ 50 mg qd  Family Hx: father had MI at 78, deceased; brother with heart disease, lung cancer, died at 41; mother had heart disease, died at 53; daughter had MI at 94, 3 stents doing well now ([place] patient); one brother with pacemaker  Social Hx: no tobacco; no alcohol, quit 4 years ago; 3-4 cans of Coke per day (12 oz)  Diet: half and half; eating out mostly sandwich shop style; at home chicken, pork, beef, trying ot do more veggies/fruit - veggies mix of ffc; not much for snacking, some crackers  Exercise: goes to PT once  weekly for balance and strength; likes to walk, but hasn't been recently  Home BP readings: readings good per daughter 123/63 a few days ago. Systolic mostly 308-657/84-69'G; new cuff, bought in August 2021, arm cuff  Intolerances:   Labs: 11/21:  Na 140, K 5.0, Glu 174, BUN 13, SCr 0.95, GFR 60  Wt Readings from Last 3 Encounters:  04/04/20 145 lb (65.8 kg)  01/31/20 144 lb 3.2 oz (65.4 kg)  01/10/20 139 lb 8.8 oz (63.3 kg)   BP Readings from Last 3 Encounters:  04/04/20 132/68  01/31/20 121/72  01/15/20 111/80   Pulse Readings from Last 3 Encounters:  04/04/20 82  01/31/20 86  01/15/20 86    Current Outpatient Medications  Medication Sig Dispense Refill  . aspirin 81 MG tablet Take 81 mg by mouth daily.    Marland Kitchen atorvastatin (LIPITOR) 80 MG tablet Take 1 tablet (80 mg total) by mouth at bedtime. 30 tablet 11  . DULoxetine (CYMBALTA) 60 MG capsule Take 60 mg by mouth daily.    . ergocalciferol (VITAMIN D2) 50000 UNITS capsule Take 50,000 Units by mouth once a week.    . fluticasone (FLONASE) 50 MCG/ACT nasal spray Place 1 spray into both nostrils daily as needed for allergies or rhinitis.     . Fluticasone-Salmeterol (ADVAIR) 250-50 MCG/DOSE AEPB Inhale 1 puff into the lungs 2 (two) times daily.    . furosemide (  LASIX) 40 MG tablet Take 1 tablet (40 mg total) by mouth daily. 90 tablet 3  . loratadine (CLARITIN) 10 MG tablet Take 1 tablet (10 mg total) by mouth daily. 30 tablet 0  . LORazepam (ATIVAN) 1 MG tablet Take 1 tablet (1 mg total) by mouth 2 (two) times daily. 3 tablet 0  . meclizine (ANTIVERT) 25 MG tablet Take 25 mg by mouth daily as needed for dizziness.    . metoprolol succinate (TOPROL-XL) 50 MG 24 hr tablet Take 1 tablet (50 mg total) by mouth daily. Take with or immediately following a meal. 90 tablet 3  . ONETOUCH DELICA LANCETS [id number] MISC     . ONETOUCH VERIO test strip     . oxyCODONE-acetaminophen (PERCOCET) 10-325 MG tablet Take 1 tablet by mouth 2 (two) times  daily as needed for pain. 3 tablet 0  . pantoprazole (PROTONIX) 20 MG tablet Take 20 mg by mouth 2 (two) times daily.    . Potassium Chloride ER 20 MEQ TBCR Take 1 tablet by mouth daily.    Marland Kitchen PROAIR HFA 108 (90 Base) MCG/ACT inhaler Inhale 2 puffs into the lungs every 4 (four) hours as needed for wheezing or shortness of breath. Uses as rescue inhaler Reported on 06/20/2015 (Patient taking differently: Inhale 2 puffs into the lungs every 4 (four) hours as needed for wheezing or shortness of breath. Uses as rescue inhaler Reported on 10/16/2019) 6.7 g 0  . promethazine (PHENERGAN) 12.5 MG tablet Take 1 tablet (12.5 mg total) by mouth every 6 (six) hours as needed for nausea. 20 tablet 0  . sacubitril-valsartan (ENTRESTO) 49-51 MG Take 1 tablet by mouth 2 (two) times daily. 60 tablet 2  . sacubitril-valsartan (ENTRESTO) 49-51 MG Take 1 tablet by mouth 2 (two) times daily. 28 tablet 0  . spironolactone (ALDACTONE) 25 MG tablet Take 1 tablet (25 mg total) by mouth daily. 90 tablet 3  . ticagrelor (BRILINTA) 90 MG TABS tablet Take 90 mg by mouth 2 (two) times daily.     No current facility-administered medications for this visit.    Allergies  Allergen Reactions  . Fish-Derived Products Anaphylaxis  . Shellfish Allergy Shortness Of Breath    In dye for scan.  . Aspirin Nausea Only and Other (See Comments)    Pt stated it only gives her problems when she takes the high dose (325 mg)  . Benadryl [Diphenhydramine]     "flips out with it. Eyes roll to the back of her head."  . Ivp Dye [Iodinated Diagnostic Agents]     Hx of ? anaphlaxis  . Latex Other (See Comments)    Pt says she gets a "stinky infection"    Past Medical History:  Diagnosis Date  . Anemia   . Anxiety   . Back pain   . CAD (coronary artery disease) 10/22/2019   DES to [facility] August 2021  . Colon polyps   . COVID-28 June 2019  . Endometriosis   . Fibromyalgia   . GERD with esophagitis    Follows with Dr. Fuller Plan with GI,  EGD in the past  . IBS (irritable bowel syndrome)   . Major depressive disorder   . Migraine   . Mild intermittent asthma   . Mixed diabetic hyperlipidemia associated with type 2 diabetes mellitus ([place])   . Obesity   . Type 2 diabetes mellitus ([place])   . Urinary incontinence     Blood pressure 132/68, pulse 82, height 5\' 2"  (  1.575 m), weight 145 lb (65.8 kg).  Acute combined systolic and diastolic congestive heart failure ([place]) Patient with HFrEF (25-30$%) currently on 3 of the GDMT medications.  Because of her history of urosepsis, would not currently recommend a trial of SGLT2 inhibitors.  Her blood pressure should tolerate an increase in Entresto, so will increase her dose to 97/103 mg twice daily.  Patient is a little hesitant about increasing the dose, so she was given a sample of the 49/51 mg and asked to take 2 tablets twice daily.  If she has problems with dizziness after a week or so on this, we will keep her at the 49/51 mg.  Otherwise, she needs to let us know and we'll call in the 97/103 mg dose.  Explained in detail the goal of getting her on maximum doses of the GDMT medications.     Tommy Medal PharmD CPP [facility] [address] [place], [place] [address] [phone]

## 2020-04-05 NOTE — Assessment & Plan Note (Signed)
Patient with HFrEF (25-30$%) currently on 3 of the GDMT medications.  Because of her history of urosepsis, would not currently recommend a trial of SGLT2 inhibitors.  Her blood pressure should tolerate an increase in Entresto, so will increase her dose to 97/103 mg twice daily.  Patient is a little hesitant about increasing the dose, so she was given a sample of the 49/51 mg and asked to take 2 tablets twice daily.  If she has problems with dizziness after a week or so on this, we will keep her at the 49/51 mg.  Otherwise, she needs to let us know and we'll call in the 97/103 mg dose.  Explained in detail the goal of getting her on maximum doses of the GDMT medications.

## 2020-04-17 ENCOUNTER — Other Ambulatory Visit: Payer: Self-pay | Admitting: Pharmacist Clinician (PhC)/ Clinical Pharmacy Specialist

## 2020-04-17 MED ORDER — SACUBITRIL-VALSARTAN 97-103 MG PO TABS: 1.0000 | ORAL_TABLET | Freq: Two times a day (BID) | ORAL | 5 refills | Status: DC

## 2020-04-17 NOTE — Progress Notes (Unsigned)
Cardiology Office Note   Date:  04/19/2020   ID:  Nicole [name], DOB 1945/11/27, MRN [id number]  PCP:  Adrian Prince, MD  Cardiologist:   Peter Swaziland, MD   Chief Complaint  Patient presents with  . Congestive Heart Failure      History of Present Illness: Nicole [name] is a 75 y.o. female who presents for follow up CHF. She has a PMH of coronary artery disease status post PCI, acute combined systolic and diastolic CHF, diabetes mellitus, depression, GERD, DOE, cardiomyopathy, and aphasia.    She was diagnosed with COVID-19 pneumonia 4/21.  She presented to the emergency department on 10/16/2019 with complaints of progressively worsening shortness of breath, DOE, and generalized weakness.  On arrival to the ED her high-sensitivity troponins were slightly elevated.  Her EKG showed sinus tachycardia with left atrial enlargement and possible lateral injury.  Her CXR showed cardiomegaly.  She was also noted to have some ST elevation in V3-V6 and LVH.  Her echocardiogram 8 /7 showed an EF of 20-25%,Global hypokinesis, G2 DD.  She underwent cardiac catheterization on 10/21/2019 which showed first marginal 95% stenosed with no other significant stenosis. OM was treated with DES.   She was managed medically with DAPT, metoprolol, diuresis, Entresto and Farxiga. Seen in office 8/31 with improvement.   She was admitted in early October with acute severe sepsis. Unknown source. Had a troponin leak felt to be due to demand ischemia. Repeat Echo showed EF 25-30%. Managed again medically. Entresto and Toprol doses increased.  Marcelline Deist was held.  She was readmitted on November 1 with urosepsis. Treated with antibiotics. Had vertigo symptoms with negative cranial CT and MRI.   On follow up today she is seen with her daughter. She reports her breathing is doing well but she does have some SOB if she walks a long way. No edema. No chest pain. Her dizziness is improved. She is still working with PT. She is  eating better. Weight is up.  Past Medical History:  Diagnosis Date  . Anemia   . Anxiety   . Back pain   . CAD (coronary artery disease) 10/22/2019   DES to OM1 August 2021  . Colon polyps   . COVID-28 June 2019  . Endometriosis   . Fibromyalgia   . GERD with esophagitis    Follows with Dr. Russella Dar with GI, EGD in the past  . IBS (irritable bowel syndrome)   . Major depressive disorder   . Migraine   . Mild intermittent asthma   . Mixed diabetic hyperlipidemia associated with type 2 diabetes mellitus ([facility])   . Obesity   . Type 2 diabetes mellitus ([facility])   . Urinary incontinence     Past Surgical History:  Procedure Laterality Date  . ABDOMINAL HYSTERECTOMY     TAH BSO  . APPENDECTOMY  1999  . CATARACT EXTRACTION     both  . CHOLECYSTECTOMY    . CORONARY STENT INTERVENTION N/A 10/21/2019   Procedure: CORONARY STENT INTERVENTION;  Surgeon: Lennette Bihari, MD;  Location[facility] INVASIVE CV LAB;  Service: Cardiovascular;  Laterality: N/A;  . DENTAL SURGERY    . HERNIA REPAIR  2000  . LAPAROSCOPIC ENDOMETRIOSIS FULGURATION    . RIGHT/LEFT HEART CATH AND CORONARY ANGIOGRAPHY N/A 10/21/2019   Procedure: RIGHT/LEFT HEART CATH AND CORONARY ANGIOGRAPHY;  Surgeon: Lennette Bihari, MD;  Location: [facility] INVASIVE CV LAB;  Service: Cardiovascular;  Laterality: N/A;     Current  Outpatient Medications  Medication Sig Dispense Refill  . aspirin 81 MG tablet Take 81 mg by mouth daily.    Marland Kitchen atorvastatin (LIPITOR) 80 MG tablet Take 1 tablet (80 mg total) by mouth at bedtime. 30 tablet 11  . DULoxetine (CYMBALTA) 60 MG capsule Take 60 mg by mouth daily.    . ergocalciferol (VITAMIN D2) 50000 UNITS capsule Take 50,000 Units by mouth once a week.    . fluticasone (FLONASE) 50 MCG/ACT nasal spray Place 1 spray into both nostrils daily as needed for allergies or rhinitis.     . Fluticasone-Salmeterol (ADVAIR) 250-50 MCG/DOSE AEPB Inhale 1 puff into the lungs 2 (two) times daily.    . furosemide  (LASIX) 40 MG tablet Take 1 tablet (40 mg total) by mouth daily. 90 tablet 3  . loratadine (CLARITIN) 10 MG tablet Take 1 tablet (10 mg total) by mouth daily. 30 tablet 0  . LORazepam (ATIVAN) 1 MG tablet Take 1 tablet (1 mg total) by mouth 2 (two) times daily. 3 tablet 0  . meclizine (ANTIVERT) 25 MG tablet Take 25 mg by mouth daily as needed for dizziness.    . metoprolol succinate (TOPROL-XL) 50 MG 24 hr tablet Take 1 tablet (50 mg total) by mouth daily. Take with or immediately following a meal. 90 tablet 3  . ONETOUCH DELICA LANCETS 82N MISC     . ONETOUCH VERIO test strip     . oxyCODONE-acetaminophen (PERCOCET) 10-325 MG tablet Take 1 tablet by mouth 2 (two) times daily as needed for pain. 3 tablet 0  . pantoprazole (PROTONIX) 20 MG tablet Take 20 mg by mouth 2 (two) times daily.    . Potassium Chloride ER 20 MEQ TBCR Take 1 tablet by mouth daily.    Marland Kitchen PROAIR HFA 108 (90 Base) MCG/ACT inhaler Inhale 2 puffs into the lungs every 4 (four) hours as needed for wheezing or shortness of breath. Uses as rescue inhaler Reported on 06/20/2015 6.7 g 0  . promethazine (PHENERGAN) 12.5 MG tablet Take 1 tablet (12.5 mg total) by mouth every 6 (six) hours as needed for nausea. 20 tablet 0  . sacubitril-valsartan (ENTRESTO) 97-103 MG Take 1 tablet by mouth 2 (two) times daily. 60 tablet 5  . spironolactone (ALDACTONE) 25 MG tablet Take 1 tablet (25 mg total) by mouth daily. 90 tablet 3  . ticagrelor (BRILINTA) 90 MG TABS tablet Take 90 mg by mouth 2 (two) times daily.     No current facility-administered medications for this visit.    Allergies:   Fish-derived products, Shellfish allergy, Aspirin, Benadryl [diphenhydramine], Ivp dye [iodinated diagnostic agents], and Latex    Social History:  The patient  reports that she has never smoked. She has never used smokeless tobacco. She reports current alcohol use. She reports that she does not use drugs.   Family History:  The patient's family history  includes Cancer in her brother, brother, paternal aunt, and paternal uncle; Diabetes in her daughter, father, and mother; Heart attack in her father; Heart failure in her father and mother; Hypertension in her father, maternal grandfather, maternal grandmother, mother, paternal grandfather, and paternal grandmother; Stroke in her father; Thyroid disease in her brother.    ROS:  Please see the history of present illness.   Otherwise, review of systems are positive for none.   All other systems are reviewed and negative.    PHYSICAL EXAM: VS:  BP 112/70   Pulse 90   Ht 5\' 2"  (1.575 m)  Wt 153 lb 3.2 oz (69.5 kg)   BMI 28.02 kg/m  , BMI Body mass index is 28.02 kg/m. GEN: Well nourished, well developed, in no acute distress  HEENT: normal  Neck: no JVD, carotid bruits, or masses Cardiac: RRR; no murmurs, rubs, or gallops,no edema  Respiratory:  clear to auscultation bilaterally, normal work of breathing GI: soft, nontender, nondistended, + BS MS: no deformity or atrophy  Skin: warm and dry, no rash Neuro:  Strength and sensation are intact Psych: euthymic mood, full affect   EKG:  EKG is not ordered today. The ekg ordered today demonstrates N/A   Recent Labs: 01/10/2020: B Natriuretic Peptide 57.1 01/11/2020: ALT 12; TSH 0.945 01/13/2020: Hemoglobin 10.9; Magnesium 2.0; Platelets 206 04/17/2020: BUN 22; Creatinine, Ser 1.10; Potassium 4.3; Sodium 142    Lipid Panel    Component Value Date/Time   CHOL 68 12/16/2019 0237   TRIG 123 12/16/2019 0237   HDL 22 (L) 12/16/2019 0237   CHOLHDL 3.1 12/16/2019 0237   VLDL 25 12/16/2019 0237   LDLCALC 21 12/16/2019 0237      Wt Readings from Last 3 Encounters:  04/19/20 153 lb 3.2 oz (69.5 kg)  04/04/20 145 lb (65.8 kg)  01/31/20 144 lb 3.2 oz (65.4 kg)      Other studies Reviewed: Additional studies/ records that were reviewed today include:   Cardiac cath/PCI 10/21/19:  CORONARY STENT INTERVENTION  RIGHT/LEFT HEART CATH AND  CORONARY ANGIOGRAPHY  Conclusion    1st Mrg lesion is 95% stenosed.  Post intervention, there is a 0% residual stenosis.  Prox RCA to Mid RCA lesion is 20% stenosed.  Prox LAD lesion is 20% stenosed.  Mid LAD lesion is 40% stenosed.  A stent was successfully placed.   Coronary obstructive disease with 20% proximal and 40% smooth mid LAD stenoses; 30% proximal circumflex stenosis with 95% near ostial stenosis in a bifurcating OM1 vessel; and 20% mid RCA narrowing.  Normal to minimally increased right heart pressures; mean PA pressure 21 mmHg  Successful PCI to 95% very proximal circumflex marginal stenosis with insertion of a 2.25 x 8 mm DES stent with the stenosis being reduced to 0%.  RECOMMENDATION: Recommend DAPT for 12 months.  Guideline directed medical therapy for the patient's significant cardiomyopathy which is out of proportion to her high-grade circumflex marginal stenosis.  Aggressive lipid-lowering therapy with target LDL less than 70.  Echo 12/13/19: IMPRESSIONS    1. EF similar to August diffuse hypokinesis worse in the septum , apex  and inferior walls Basal lateral wall only area with preserved function .  Left ventricular ejection fraction, by estimation, is 25 to 30%. The left  ventricle has severely decreased  function. The left ventricle demonstrates regional wall motion  abnormalities (see scoring diagram/findings for description). The left  ventricular internal cavity size was moderately dilated. Left ventricular  diastolic parameters were normal.  2. Right ventricular systolic function was not well visualized. The right  ventricular size is not well visualized. There is mildly elevated  pulmonary artery systolic pressure.  3. The mitral valve is normal in structure. Trivial mitral valve  regurgitation. No evidence of mitral stenosis.  4. Tricuspid valve regurgitation is moderate.  5. The aortic valve is normal in structure. Aortic valve  regurgitation is  not visualized. Mild to moderate aortic valve sclerosis/calcification is  present, without any evidence of aortic stenosis.  6. The inferior vena cava is normal in size with greater than 50%  respiratory variability, suggesting right  atrial pressure of 3 mmHg.    ASSESSMENT AND PLAN:  1. Chronic combined systolic/diastolic CHF-EF 123XX123. Appears to be predominantly nonischemic.  Underwent cardiac catheterization 10/21/2019 which showed 95% OM lesion was treated with PCI/DES x1, 20% medial RCA, 20% proximal LAD had 40% medial LAD.  It was felt that her cardiomyopathy was out of proportion to her high-grade OM stenosis.  Persistent low EF by Echo in October but had recurrent urosepsis then.  She is on optimal therapy with maximum dose Entresto. On Toprol XL, lasix, and aldactone. I would avoid SGLT 2 inhibitors given urinary infections. BMET stable.  Heart healthy low-sodium diet Increase physical activity as tolerated Will plan on follow up in 4 months with repeat Echo.   2. Coronary artery disease-s/p DES of OM 1 in August. She is asymptomatic.  Continue aspirin and Brilinta for one year continue metoprolol, atorvastatin  3. Essential hypertension-BP well controlled. Continue metoprolol, entresto, aldactone   4. Type 2 diabetes-A1c 6.5 on recent hospital admission. States her BS are doing well. Followed by PCP  5. Recurrent urosepsis.  Avoid SGLT 2 inhibitors.    Current medicines are reviewed at length with the patient today.  The patient does not have concerns regarding medicines.  The following changes have been made:  See above  Labs/ tests ordered today include:   No orders of the defined types were placed in this encounter.    Disposition:   FU with me in 4 months with Echo.   Signed, Peter Martinique, MD  04/19/2020 2:22 PM    [facility] [address] [place], [place], [address] Phone [phone], Fax [phone]

## 2020-04-18 LAB — BASIC METABOLIC PANEL
BUN/Creatinine Ratio: 20 (ref 12–28)
BUN: 22 mg/dL (ref 8–27)
CO2: 25 mmol/L (ref 20–29)
Calcium: 9.8 mg/dL (ref 8.7–10.3)
Chloride: 99 mmol/L (ref 96–106)
Creatinine, Ser: 1.1 mg/dL — ABNORMAL HIGH (ref 0.57–1.00)
GFR calc Af Amer: 58 mL/min/{1.73_m2} — ABNORMAL LOW (ref >59)
GFR calc non Af Amer: 50 mL/min/{1.73_m2} — ABNORMAL LOW (ref >59)
Glucose: 137 mg/dL — ABNORMAL HIGH (ref 65–99)
Potassium: 4.3 mmol/L (ref 3.5–5.2)
Sodium: 142 mmol/L (ref 134–144)

## 2020-04-19 ENCOUNTER — Other Ambulatory Visit: Payer: Self-pay

## 2020-04-19 ENCOUNTER — Encounter: Payer: Self-pay | Admitting: Cardiology

## 2020-04-19 ENCOUNTER — Ambulatory Visit: Payer: Medicare Other | Admitting: Cardiology

## 2020-04-19 VITALS — BP 112/70 | HR 90 | Ht 62.0 in | Wt 153.2 lb

## 2020-04-19 DIAGNOSIS — E119 Type 2 diabetes mellitus without complications: Secondary | ICD-10-CM

## 2020-04-19 DIAGNOSIS — I10 Essential (primary) hypertension: Secondary | ICD-10-CM

## 2020-04-19 DIAGNOSIS — I2510 Atherosclerotic heart disease of native coronary artery without angina pectoris: Secondary | ICD-10-CM | POA: Diagnosis not present

## 2020-04-19 DIAGNOSIS — I5022 Chronic systolic (congestive) heart failure: Secondary | ICD-10-CM | POA: Diagnosis not present

## 2020-04-19 NOTE — Patient Instructions (Addendum)
Low-Sodium Eating Plan Sodium, which is an element that makes up salt, helps you maintain a healthy balance of fluids in your body. Too much sodium can increase your blood pressure and cause fluid and waste to be held in your body. Your health care provider or dietitian may recommend following this plan if you have high blood pressure (hypertension), kidney disease, liver disease, or heart failure. Eating less sodium can help lower your blood pressure, reduce swelling, and protect your heart, liver, and kidneys. What are tips for following this plan? Reading food labels  The Nutrition Facts label lists the amount of sodium in one serving of the food. If you eat more than one serving, you must multiply the listed amount of sodium by the number of servings.  Choose foods with less than 140 mg of sodium per serving.  Avoid foods with 300 mg of sodium or more per serving. Shopping  Look for lower-sodium products, often labeled as "low-sodium" or "no salt added."  Always check the sodium content, even if foods are labeled as "unsalted" or "no salt added."  Buy fresh foods. ? Avoid canned foods and pre-made or frozen meals. ? Avoid canned, cured, or processed meats.  Buy breads that have less than 80 mg of sodium per slice.   Cooking  Eat more home-cooked food and less restaurant, buffet, and fast food.  Avoid adding salt when cooking. Use salt-free seasonings or herbs instead of table salt or sea salt. Check with your health care provider or pharmacist before using salt substitutes.  Cook with plant-based oils, such as canola, sunflower, or olive oil.   Meal planning  When eating at a restaurant, ask that your food be prepared with less salt or no salt, if possible. Avoid dishes labeled as brined, pickled, cured, smoked, or made with soy sauce, miso, or teriyaki sauce.  Avoid foods that contain MSG (monosodium glutamate). MSG is sometimes added to Chinese food, bouillon, and some canned  foods.  Make meals that can be grilled, baked, poached, roasted, or steamed. These are generally made with less sodium. General information Most people on this plan should limit their sodium intake to 1,500-2,000 mg (milligrams) of sodium each day. What foods should I eat? Fruits Fresh, frozen, or canned fruit. Fruit juice. Vegetables Fresh or frozen vegetables. "No salt added" canned vegetables. "No salt added" tomato sauce and paste. Low-sodium or reduced-sodium tomato and vegetable juice. Grains Low-sodium cereals, including oats, puffed wheat and rice, and shredded wheat. Low-sodium crackers. Unsalted rice. Unsalted pasta. Low-sodium bread. Whole-grain breads and whole-grain pasta. Meats and other proteins Fresh or frozen (no salt added) meat, poultry, seafood, and fish. Low-sodium canned tuna and salmon. Unsalted nuts. Dried peas, beans, and lentils without added salt. Unsalted canned beans. Eggs. Unsalted nut butters. Dairy Milk. Soy milk. Cheese that is naturally low in sodium, such as ricotta cheese, fresh mozzarella, or Swiss cheese. Low-sodium or reduced-sodium cheese. Cream cheese. Yogurt. Seasonings and condiments Fresh and dried herbs and spices. Salt-free seasonings. Low-sodium mustard and ketchup. Sodium-free salad dressing. Sodium-free light mayonnaise. Fresh or refrigerated horseradish. Lemon juice. Vinegar. Other foods Homemade, reduced-sodium, or low-sodium soups. Unsalted popcorn and pretzels. Low-salt or salt-free chips. The items listed above may not be a complete list of foods and beverages you can eat. Contact a dietitian for more information. What foods should I avoid? Vegetables Sauerkraut, pickled vegetables, and relishes. Olives. French fries. Onion rings. Regular canned vegetables (not low-sodium or reduced-sodium). Regular canned tomato sauce and paste (not low-sodium   or reduced-sodium). Regular tomato and vegetable juice (not low-sodium or reduced-sodium). Frozen  vegetables in sauces. Grains Instant hot cereals. Bread stuffing, pancake, and biscuit mixes. Croutons. Seasoned rice or pasta mixes. Noodle soup cups. Boxed or frozen macaroni and cheese. Regular salted crackers. Self-rising flour. Meats and other proteins Meat or fish that is salted, canned, smoked, spiced, or pickled. Precooked or cured meat, such as sausages or meat loaves. Berniece Salines. Ham. Pepperoni. Hot dogs. Corned beef. Chipped beef. Salt pork. Jerky. Pickled herring. Anchovies and sardines. Regular canned tuna. Salted nuts. Dairy Processed cheese and cheese spreads. Hard cheeses. Cheese curds. Blue cheese. Feta cheese. String cheese. Regular cottage cheese. Buttermilk. Canned milk. Fats and oils Salted butter. Regular margarine. Ghee. Bacon fat. Seasonings and condiments Onion salt, garlic salt, seasoned salt, table salt, and sea salt. Canned and packaged gravies. Worcestershire sauce. Tartar sauce. Barbecue sauce. Teriyaki sauce. Soy sauce, including reduced-sodium. Steak sauce. Fish sauce. Oyster sauce. Cocktail sauce. Horseradish that you find on the shelf. Regular ketchup and mustard. Meat flavorings and tenderizers. Bouillon cubes. Hot sauce. Pre-made or packaged marinades. Pre-made or packaged taco seasonings. Relishes. Regular salad dressings. Salsa. Other foods Salted popcorn and pretzels. Corn chips and puffs. Potato and tortilla chips. Canned or dried soups. Pizza. Frozen entrees and pot pies. The items listed above may not be a complete list of foods and beverages you should avoid. Contact a dietitian for more information. Summary  Eating less sodium can help lower your blood pressure, reduce swelling, and protect your heart, liver, and kidneys.  Most people on this plan should limit their sodium intake to 1,500-2,000 mg (milligrams) of sodium each day.  Canned, boxed, and frozen foods are high in sodium. Restaurant foods, fast foods, and pizza are also very high in sodium. You  also get sodium by adding salt to food.  Try to cook at home, eat more fresh fruits and vegetables, and eat less fast food and canned, processed, or prepared foods. This information is not intended to replace advice given to you by your health care provider. Make sure you discuss any questions you have with your health care provider. Document Revised: 04/02/2019 Document Reviewed: 01/27/2019 Elsevier Patient Education  2021 [place].   Medication Instructions:  Continue same medications *If you need a refill on your cardiac medications before your next appointment, please call your pharmacy*   Lab Work: None ordered   Testing/Procedures: Echo   Follow-Up: At [facility], you and your health needs are our priority.  As part of our continuing mission to provide you with exceptional heart care, we have created designated Provider Care Teams.  These Care Teams include your primary Cardiologist (physician) and Advanced Practice Providers (APPs -  Physician Assistants and Nurse Practitioners) who all work together to provide you with the care you need, when you need it.  We recommend signing up for the patient portal called "MyChart".  Sign up information is provided on this After Visit Summary.  MyChart is used to connect with patients for Virtual Visits (Telemedicine).  Patients are able to view lab/test results, encounter notes, upcoming appointments, etc.  Non-urgent messages can be sent to your provider as well.   To learn more about what you can do with MyChart, go to [email or web address].    Your next appointment:  4 months   The format for your next appointment: Office    Provider:  Dr.Jordan

## 2020-07-03 ENCOUNTER — Other Ambulatory Visit: Payer: Self-pay | Admitting: Endocrinology

## 2020-07-03 DIAGNOSIS — Z1231 Encounter for screening mammogram for malignant neoplasm of breast: Secondary | ICD-10-CM

## 2020-07-14 ENCOUNTER — Other Ambulatory Visit (HOSPITAL_BASED_OUTPATIENT_CLINIC_OR_DEPARTMENT_OTHER): Payer: Self-pay

## 2020-08-17 ENCOUNTER — Ambulatory Visit (HOSPITAL_COMMUNITY): Payer: Medicare Other | Attending: Cardiology

## 2020-08-22 ENCOUNTER — Ambulatory Visit: Payer: Medicare Other

## 2020-08-25 ENCOUNTER — Encounter (HOSPITAL_COMMUNITY): Payer: Self-pay | Admitting: Cardiology

## 2020-08-27 ENCOUNTER — Emergency Department (HOSPITAL_COMMUNITY): Payer: Medicare Other

## 2020-08-27 ENCOUNTER — Inpatient Hospital Stay (HOSPITAL_COMMUNITY): Payer: Medicare Other

## 2020-08-27 ENCOUNTER — Inpatient Hospital Stay (HOSPITAL_COMMUNITY)
Admission: EM | Admit: 2020-08-27 | Discharge: 2020-08-30 | DRG: 683 | Disposition: A | Discharge status: Home Health | Payer: Medicare Other | Attending: Internal Medicine | Admitting: Internal Medicine

## 2020-08-27 ENCOUNTER — Encounter (HOSPITAL_COMMUNITY): Payer: Self-pay | Admitting: *Deleted

## 2020-08-27 ENCOUNTER — Other Ambulatory Visit: Payer: Self-pay

## 2020-08-27 DIAGNOSIS — Z7189 Other specified counseling: Secondary | ICD-10-CM | POA: Diagnosis not present

## 2020-08-27 DIAGNOSIS — Z90722 Acquired absence of ovaries, bilateral: Secondary | ICD-10-CM

## 2020-08-27 DIAGNOSIS — R627 Adult failure to thrive: Secondary | ICD-10-CM | POA: Diagnosis present

## 2020-08-27 DIAGNOSIS — I2510 Atherosclerotic heart disease of native coronary artery without angina pectoris: Secondary | ICD-10-CM | POA: Diagnosis present

## 2020-08-27 DIAGNOSIS — J4520 Mild intermittent asthma, uncomplicated: Secondary | ICD-10-CM | POA: Diagnosis present

## 2020-08-27 DIAGNOSIS — Z20822 Contact with and (suspected) exposure to covid-19: Secondary | ICD-10-CM | POA: Diagnosis present

## 2020-08-27 DIAGNOSIS — Z90710 Acquired absence of both cervix and uterus: Secondary | ICD-10-CM | POA: Diagnosis not present

## 2020-08-27 DIAGNOSIS — Z86010 Personal history of colonic polyps: Secondary | ICD-10-CM

## 2020-08-27 DIAGNOSIS — I951 Orthostatic hypotension: Secondary | ICD-10-CM | POA: Diagnosis present

## 2020-08-27 DIAGNOSIS — E669 Obesity, unspecified: Secondary | ICD-10-CM | POA: Diagnosis present

## 2020-08-27 DIAGNOSIS — E785 Hyperlipidemia, unspecified: Secondary | ICD-10-CM | POA: Diagnosis present

## 2020-08-27 DIAGNOSIS — R634 Abnormal weight loss: Secondary | ICD-10-CM | POA: Diagnosis present

## 2020-08-27 DIAGNOSIS — F32A Depression, unspecified: Secondary | ICD-10-CM | POA: Diagnosis present

## 2020-08-27 DIAGNOSIS — R5381 Other malaise: Secondary | ICD-10-CM | POA: Diagnosis present

## 2020-08-27 DIAGNOSIS — Z7902 Long term (current) use of antithrombotics/antiplatelets: Secondary | ICD-10-CM

## 2020-08-27 DIAGNOSIS — R2681 Unsteadiness on feet: Secondary | ICD-10-CM | POA: Diagnosis present

## 2020-08-27 DIAGNOSIS — Z9049 Acquired absence of other specified parts of digestive tract: Secondary | ICD-10-CM

## 2020-08-27 DIAGNOSIS — Z8249 Family history of ischemic heart disease and other diseases of the circulatory system: Secondary | ICD-10-CM

## 2020-08-27 DIAGNOSIS — Y93E1 Activity, personal bathing and showering: Secondary | ICD-10-CM

## 2020-08-27 DIAGNOSIS — E876 Hypokalemia: Secondary | ICD-10-CM

## 2020-08-27 DIAGNOSIS — E782 Mixed hyperlipidemia: Secondary | ICD-10-CM | POA: Diagnosis present

## 2020-08-27 DIAGNOSIS — I252 Old myocardial infarction: Secondary | ICD-10-CM | POA: Diagnosis not present

## 2020-08-27 DIAGNOSIS — N1831 Chronic kidney disease, stage 3a: Secondary | ICD-10-CM | POA: Diagnosis present

## 2020-08-27 DIAGNOSIS — W182XXA Fall in (into) shower or empty bathtub, initial encounter: Secondary | ICD-10-CM | POA: Diagnosis present

## 2020-08-27 DIAGNOSIS — Z833 Family history of diabetes mellitus: Secondary | ICD-10-CM | POA: Diagnosis not present

## 2020-08-27 DIAGNOSIS — Z8349 Family history of other endocrine, nutritional and metabolic diseases: Secondary | ICD-10-CM

## 2020-08-27 DIAGNOSIS — U099 Post covid-19 condition, unspecified: Secondary | ICD-10-CM | POA: Diagnosis present

## 2020-08-27 DIAGNOSIS — Z801 Family history of malignant neoplasm of trachea, bronchus and lung: Secondary | ICD-10-CM | POA: Diagnosis not present

## 2020-08-27 DIAGNOSIS — Z515 Encounter for palliative care: Secondary | ICD-10-CM

## 2020-08-27 DIAGNOSIS — F419 Anxiety disorder, unspecified: Secondary | ICD-10-CM | POA: Diagnosis present

## 2020-08-27 DIAGNOSIS — E1122 Type 2 diabetes mellitus with diabetic chronic kidney disease: Secondary | ICD-10-CM | POA: Diagnosis present

## 2020-08-27 DIAGNOSIS — Z91041 Radiographic dye allergy status: Secondary | ICD-10-CM

## 2020-08-27 DIAGNOSIS — E1169 Type 2 diabetes mellitus with other specified complication: Secondary | ICD-10-CM | POA: Diagnosis present

## 2020-08-27 DIAGNOSIS — R6520 Severe sepsis without septic shock: Secondary | ICD-10-CM

## 2020-08-27 DIAGNOSIS — N179 Acute kidney failure, unspecified: Secondary | ICD-10-CM | POA: Diagnosis present

## 2020-08-27 DIAGNOSIS — I130 Hypertensive heart and chronic kidney disease with heart failure and stage 1 through stage 4 chronic kidney disease, or unspecified chronic kidney disease: Secondary | ICD-10-CM | POA: Diagnosis present

## 2020-08-27 DIAGNOSIS — E869 Volume depletion, unspecified: Secondary | ICD-10-CM | POA: Diagnosis present

## 2020-08-27 DIAGNOSIS — Z955 Presence of coronary angioplasty implant and graft: Secondary | ICD-10-CM

## 2020-08-27 DIAGNOSIS — Z7951 Long term (current) use of inhaled steroids: Secondary | ICD-10-CM

## 2020-08-27 DIAGNOSIS — Z823 Family history of stroke: Secondary | ICD-10-CM

## 2020-08-27 DIAGNOSIS — G894 Chronic pain syndrome: Secondary | ICD-10-CM | POA: Diagnosis present

## 2020-08-27 DIAGNOSIS — Z7982 Long term (current) use of aspirin: Secondary | ICD-10-CM

## 2020-08-27 DIAGNOSIS — Z66 Do not resuscitate: Secondary | ICD-10-CM | POA: Diagnosis present

## 2020-08-27 DIAGNOSIS — A419 Sepsis, unspecified organism: Secondary | ICD-10-CM

## 2020-08-27 DIAGNOSIS — Z91013 Allergy to seafood: Secondary | ICD-10-CM

## 2020-08-27 DIAGNOSIS — Z6829 Body mass index (BMI) 29.0-29.9, adult: Secondary | ICD-10-CM

## 2020-08-27 DIAGNOSIS — Z7901 Long term (current) use of anticoagulants: Secondary | ICD-10-CM

## 2020-08-27 DIAGNOSIS — Z808 Family history of malignant neoplasm of other organs or systems: Secondary | ICD-10-CM

## 2020-08-27 DIAGNOSIS — M797 Fibromyalgia: Secondary | ICD-10-CM | POA: Diagnosis present

## 2020-08-27 DIAGNOSIS — I5042 Chronic combined systolic (congestive) and diastolic (congestive) heart failure: Secondary | ICD-10-CM | POA: Diagnosis present

## 2020-08-27 DIAGNOSIS — Z91040 Latex allergy status: Secondary | ICD-10-CM

## 2020-08-27 DIAGNOSIS — R001 Bradycardia, unspecified: Secondary | ICD-10-CM | POA: Diagnosis present

## 2020-08-27 DIAGNOSIS — M6281 Muscle weakness (generalized): Secondary | ICD-10-CM | POA: Diagnosis present

## 2020-08-27 DIAGNOSIS — Z79899 Other long term (current) drug therapy: Secondary | ICD-10-CM

## 2020-08-27 DIAGNOSIS — Z888 Allergy status to other drugs, medicaments and biological substances status: Secondary | ICD-10-CM

## 2020-08-27 LAB — URINALYSIS, ROUTINE W REFLEX MICROSCOPIC
Bilirubin Urine: NEGATIVE
Glucose, UA: NEGATIVE mg/dL
Hgb urine dipstick: NEGATIVE
Ketones, ur: NEGATIVE mg/dL
Leukocytes,Ua: NEGATIVE
Nitrite: NEGATIVE
Protein, ur: NEGATIVE mg/dL
Specific Gravity, Urine: 1.01 (ref 1.005–1.030)
pH: 5.5 (ref 5.0–8.0)

## 2020-08-27 LAB — CBC WITH DIFFERENTIAL/PLATELET
Abs Immature Granulocytes: 0.34 10*3/uL — ABNORMAL HIGH (ref 0.00–0.07)
Basophils Absolute: 0.1 10*3/uL (ref 0.0–0.1)
Basophils Relative: 0 %
Eosinophils Absolute: 0.2 10*3/uL (ref 0.0–0.5)
Eosinophils Relative: 1 %
HCT: 39.6 % (ref 36.0–46.0)
Hemoglobin: 13.4 g/dL (ref 12.0–15.0)
Immature Granulocytes: 2 %
Lymphocytes Relative: 13 %
Lymphs Abs: 2.5 10*3/uL (ref 0.7–4.0)
MCH: 31.2 pg (ref 26.0–34.0)
MCHC: 33.8 g/dL (ref 30.0–36.0)
MCV: 92.1 fL (ref 80.0–100.0)
Monocytes Absolute: 1.2 10*3/uL — ABNORMAL HIGH (ref 0.1–1.0)
Monocytes Relative: 6 %
Neutro Abs: 14.8 10*3/uL — ABNORMAL HIGH (ref 1.7–7.7)
Neutrophils Relative %: 78 %
Platelets: 327 10*3/uL (ref 150–400)
RBC: 4.3 MIL/uL (ref 3.87–5.11)
RDW: 12.7 % (ref 11.5–15.5)
WBC: 19.1 10*3/uL — ABNORMAL HIGH (ref 4.0–10.5)
nRBC: 0 % (ref 0.0–0.2)

## 2020-08-27 LAB — COMPREHENSIVE METABOLIC PANEL
ALT: 24 U/L (ref 0–44)
AST: 19 U/L (ref 15–41)
Albumin: 4.3 g/dL (ref 3.5–5.0)
Alkaline Phosphatase: 90 U/L (ref 38–126)
Anion gap: 19 — ABNORMAL HIGH (ref 5–15)
BUN: 106 mg/dL — ABNORMAL HIGH (ref 8–23)
CO2: 16 mmol/L — ABNORMAL LOW (ref 22–32)
Calcium: 8.2 mg/dL — ABNORMAL LOW (ref 8.9–10.3)
Chloride: 99 mmol/L (ref 98–111)
Creatinine, Ser: 4.96 mg/dL — ABNORMAL HIGH (ref 0.44–1.00)
GFR, Estimated: 9 mL/min — ABNORMAL LOW (ref >60)
Glucose, Bld: 162 mg/dL — ABNORMAL HIGH (ref 70–99)
Potassium: 2.9 mmol/L — ABNORMAL LOW (ref 3.5–5.1)
Sodium: 134 mmol/L — ABNORMAL LOW (ref 135–145)
Total Bilirubin: 1.3 mg/dL — ABNORMAL HIGH (ref 0.3–1.2)
Total Protein: 8.1 g/dL (ref 6.5–8.1)

## 2020-08-27 LAB — RESP PANEL BY RT-PCR (FLU A&B, COVID) ARPGX2
Influenza A by PCR: NEGATIVE
Influenza B by PCR: NEGATIVE
SARS Coronavirus 2 by RT PCR: NEGATIVE

## 2020-08-27 LAB — I-STAT CHEM 8, ED
BUN: 121 mg/dL — ABNORMAL HIGH (ref 8–23)
Calcium, Ion: 0.96 mmol/L — ABNORMAL LOW (ref 1.15–1.40)
Chloride: 103 mmol/L (ref 98–111)
Creatinine, Ser: 5.3 mg/dL — ABNORMAL HIGH (ref 0.44–1.00)
Glucose, Bld: 159 mg/dL — ABNORMAL HIGH (ref 70–99)
HCT: 42 % (ref 36.0–46.0)
Hemoglobin: 14.3 g/dL (ref 12.0–15.0)
Potassium: 2.9 mmol/L — ABNORMAL LOW (ref 3.5–5.1)
Sodium: 136 mmol/L (ref 135–145)
TCO2: 17 mmol/L — ABNORMAL LOW (ref 22–32)

## 2020-08-27 LAB — PROTIME-INR
INR: 1.1 (ref 0.8–1.2)
Prothrombin Time: 14.1 seconds (ref 11.4–15.2)

## 2020-08-27 LAB — LACTIC ACID, PLASMA: Lactic Acid, Venous: 0.8 mmol/L (ref 0.5–1.9)

## 2020-08-27 LAB — CBG MONITORING, ED: Glucose-Capillary: 108 mg/dL — ABNORMAL HIGH (ref 70–99)

## 2020-08-27 MED ORDER — SODIUM CHLORIDE 0.9 % IV BOLUS (SEPSIS): 250.0000 mL | Freq: Once | INTRAVENOUS | Status: DC

## 2020-08-27 MED ORDER — MECLIZINE HCL 25 MG PO TABS: 25.0000 mg | ORAL_TABLET | Freq: Every day | ORAL | Status: DC | PRN

## 2020-08-27 MED ORDER — ONDANSETRON HCL 4 MG PO TABS: 4.0000 mg | ORAL_TABLET | Freq: Four times a day (QID) | ORAL | Status: DC | PRN

## 2020-08-27 MED ORDER — OXYCODONE HCL 5 MG PO TABS
5.0000 mg | ORAL_TABLET | Freq: Two times a day (BID) | ORAL | Status: DC | PRN
  Administered 2020-08-28 – 2020-08-29 (×2): 5 mg via ORAL
  Filled 2020-08-27 (×2): qty 1

## 2020-08-27 MED ORDER — ONDANSETRON HCL 4 MG/2ML IJ SOLN: 4.0000 mg | Freq: Four times a day (QID) | INTRAMUSCULAR | Status: DC | PRN

## 2020-08-27 MED ORDER — SODIUM CHLORIDE 0.9 % IV SOLN: 2.0000 g | INTRAVENOUS | Status: DC

## 2020-08-27 MED ORDER — ACETAMINOPHEN 650 MG RE SUPP: 650.0000 mg | Freq: Four times a day (QID) | RECTAL | Status: DC | PRN

## 2020-08-27 MED ORDER — SODIUM CHLORIDE 0.9 % IV BOLUS (SEPSIS)
1000.0000 mL | Freq: Once | INTRAVENOUS | Status: AC
  Administered 2020-08-27: 1000 mL via INTRAVENOUS

## 2020-08-27 MED ORDER — VANCOMYCIN HCL 1500 MG/300ML IV SOLN
1500.0000 mg | Freq: Once | INTRAVENOUS | Status: AC
  Administered 2020-08-27: 1500 mg via INTRAVENOUS
  Filled 2020-08-27: qty 300

## 2020-08-27 MED ORDER — DULOXETINE HCL 60 MG PO CPEP
60.0000 mg | ORAL_CAPSULE | Freq: Every day | ORAL | Status: DC
  Administered 2020-08-27 – 2020-08-30 (×4): 60 mg via ORAL
  Filled 2020-08-27 (×4): qty 1

## 2020-08-27 MED ORDER — POTASSIUM CHLORIDE 10 MEQ/100ML IV SOLN
10.0000 meq | INTRAVENOUS | Status: AC
  Administered 2020-08-27 (×2): 10 meq via INTRAVENOUS
  Filled 2020-08-27 (×3): qty 100

## 2020-08-27 MED ORDER — LORATADINE 10 MG PO TABS
10.0000 mg | ORAL_TABLET | Freq: Every day | ORAL | Status: DC
  Administered 2020-08-27 – 2020-08-30 (×4): 10 mg via ORAL
  Filled 2020-08-27 (×4): qty 1

## 2020-08-27 MED ORDER — DOCUSATE SODIUM 100 MG PO CAPS
100.0000 mg | ORAL_CAPSULE | Freq: Two times a day (BID) | ORAL | Status: DC
  Administered 2020-08-27 – 2020-08-30 (×6): 100 mg via ORAL
  Filled 2020-08-27 (×7): qty 1

## 2020-08-27 MED ORDER — BISACODYL 5 MG PO TBEC: 5.0000 mg | DELAYED_RELEASE_TABLET | Freq: Every day | ORAL | Status: DC | PRN

## 2020-08-27 MED ORDER — ACETAMINOPHEN 325 MG PO TABS: 650.0000 mg | ORAL_TABLET | Freq: Four times a day (QID) | ORAL | Status: DC | PRN

## 2020-08-27 MED ORDER — ALBUTEROL SULFATE (2.5 MG/3ML) 0.083% IN NEBU
2.5000 mg | INHALATION_SOLUTION | RESPIRATORY_TRACT | Status: DC | PRN
  Administered 2020-08-30: 2.5 mg via RESPIRATORY_TRACT
  Filled 2020-08-27: qty 3

## 2020-08-27 MED ORDER — METRONIDAZOLE 500 MG/100ML IV SOLN
500.0000 mg | Freq: Once | INTRAVENOUS | Status: AC
  Administered 2020-08-27: 500 mg via INTRAVENOUS
  Filled 2020-08-27: qty 100

## 2020-08-27 MED ORDER — SODIUM CHLORIDE 0.9 % IV BOLUS: 500.0000 mL | Freq: Once | INTRAVENOUS | Status: DC

## 2020-08-27 MED ORDER — POLYETHYLENE GLYCOL 3350 17 G PO PACK: 17.0000 g | PACK | Freq: Every day | ORAL | Status: DC | PRN

## 2020-08-27 MED ORDER — ENOXAPARIN SODIUM 30 MG/0.3ML IJ SOSY
30.0000 mg | PREFILLED_SYRINGE | INTRAMUSCULAR | Status: DC
  Administered 2020-08-27 – 2020-08-28 (×2): 30 mg via SUBCUTANEOUS
  Filled 2020-08-27 (×2): qty 0.3

## 2020-08-27 MED ORDER — POTASSIUM CHLORIDE CRYS ER 20 MEQ PO TBCR
20.0000 meq | EXTENDED_RELEASE_TABLET | Freq: Every day | ORAL | Status: DC
  Administered 2020-08-27: 20 meq via ORAL
  Filled 2020-08-27: qty 1

## 2020-08-27 MED ORDER — ATORVASTATIN CALCIUM 80 MG PO TABS
80.0000 mg | ORAL_TABLET | Freq: Every day | ORAL | Status: DC
  Administered 2020-08-27 – 2020-08-29 (×3): 80 mg via ORAL
  Filled 2020-08-27 (×2): qty 1
  Filled 2020-08-27: qty 2

## 2020-08-27 MED ORDER — SODIUM CHLORIDE 0.9 % IV SOLN
2.0000 g | Freq: Once | INTRAVENOUS | Status: AC
  Administered 2020-08-27: 2 g via INTRAVENOUS
  Filled 2020-08-27: qty 2

## 2020-08-27 MED ORDER — ONDANSETRON HCL 4 MG/2ML IJ SOLN
4.0000 mg | Freq: Once | INTRAMUSCULAR | Status: AC
  Administered 2020-08-27: 4 mg via INTRAVENOUS
  Filled 2020-08-27: qty 2

## 2020-08-27 MED ORDER — OXYCODONE-ACETAMINOPHEN 10-325 MG PO TABS: 1.0000 | ORAL_TABLET | Freq: Two times a day (BID) | ORAL | Status: DC | PRN

## 2020-08-27 MED ORDER — PANTOPRAZOLE SODIUM 20 MG PO TBEC
20.0000 mg | DELAYED_RELEASE_TABLET | Freq: Two times a day (BID) | ORAL | Status: DC
  Administered 2020-08-27 – 2020-08-30 (×7): 20 mg via ORAL
  Filled 2020-08-27 (×8): qty 1

## 2020-08-27 MED ORDER — MOMETASONE FURO-FORMOTEROL FUM 200-5 MCG/ACT IN AERO
2.0000 | INHALATION_SPRAY | Freq: Two times a day (BID) | RESPIRATORY_TRACT | Status: DC
  Administered 2020-08-28 – 2020-08-30 (×5): 2 via RESPIRATORY_TRACT
  Filled 2020-08-27: qty 8.8

## 2020-08-27 MED ORDER — TICAGRELOR 90 MG PO TABS
90.0000 mg | ORAL_TABLET | Freq: Two times a day (BID) | ORAL | Status: DC
  Administered 2020-08-27 – 2020-08-30 (×7): 90 mg via ORAL
  Filled 2020-08-27 (×7): qty 1

## 2020-08-27 MED ORDER — SODIUM CHLORIDE 0.9% FLUSH
3.0000 mL | Freq: Two times a day (BID) | INTRAVENOUS | Status: DC
  Administered 2020-08-27 – 2020-08-30 (×5): 3 mL via INTRAVENOUS

## 2020-08-27 MED ORDER — ASPIRIN EC 81 MG PO TBEC
81.0000 mg | DELAYED_RELEASE_TABLET | Freq: Every day | ORAL | Status: DC
  Administered 2020-08-27 – 2020-08-30 (×4): 81 mg via ORAL
  Filled 2020-08-27 (×4): qty 1

## 2020-08-27 MED ORDER — LACTATED RINGERS IV SOLN: INTRAVENOUS | Status: DC

## 2020-08-27 MED ORDER — OXYCODONE-ACETAMINOPHEN 5-325 MG PO TABS
1.0000 | ORAL_TABLET | Freq: Two times a day (BID) | ORAL | Status: DC | PRN
  Administered 2020-08-27 – 2020-08-29 (×4): 1 via ORAL
  Filled 2020-08-27 (×4): qty 1

## 2020-08-27 MED ORDER — LORAZEPAM 1 MG PO TABS
1.0000 mg | ORAL_TABLET | Freq: Two times a day (BID) | ORAL | Status: DC
  Administered 2020-08-27 – 2020-08-30 (×6): 1 mg via ORAL
  Filled 2020-08-27 (×6): qty 1

## 2020-08-27 NOTE — ED Provider Notes (Signed)
Emergency Medicine Provider Triage Evaluation Note  Nicole [name] , a 75 y.o. female  was evaluated in triage.  Pt with hx of CHF (EF ~25%), CAD s/p PCI (on ASA/Brilinta), DM, HLD, presents for complaints of generalized weakness. Symptoms have been progressive over the past 2 months, but worse tonight. Uses a walker to ambulate and had an episode tonight were she felt lightheaded and lost her balance; fell and hit her head. Notes some new SOB over the past 2 weeks. No associated fevers, vomiting, chest pain, blood in stools. Denies recent changes to her medications.   Review of Systems  Positive: Weakness, lightheadedness Negative: Fever, vomiting, chest pain  Physical Exam  BP (!) 84/66 (BP Location: Left Arm)   Pulse 81   Temp 98 F (36.7 C)   Resp 18   SpO2 100%  Gen:   Awake, but sluggish Resp:  Normal effort  MSK:   Moves all extremities symmetrically Other:  Ill appearing, pale. Speech slowed.  Medical Decision Making  Medically screening exam initiated at 3:19 AM.  Appropriate orders placed.  CHAILYN RACETTE was informed that the remainder of the evaluation will be completed by another provider, this initial triage assessment does not replace that evaluation, and the importance of remaining in the ED until their evaluation is complete.  Generalized weakness, hypotension   Antonietta Breach, PA-C 08/27/20 0325    Merryl Hacker, MD 08/28/20 [phone]

## 2020-08-27 NOTE — Progress Notes (Signed)
Pharmacy Antibiotic Note  Nicole [name] is a 75 y.o. female admitted on 08/27/2020 with sepsis.  Pharmacy has been consulted for vancomycin/cefepime dosing. WBC is elevated. Acute renal failure.   Plan: -Vancomycin 1500 mg IV x 1, further dosing based on Scr trend -Cefepime 2g IV q24h -Trend WBC, temp, renal function  -F/U infectious work-up -Drug levels as indicated   Height: 5\' 2"  (157.5 cm) Weight: 69.5 kg (153 lb 3.5 oz) IBW/kg (Calculated) : 50.1  Temp (24hrs), Avg:98 F (36.7 C), Min:98 F (36.7 C), Max:98 F (36.7 C)  Recent Labs  Lab 08/27/20 0319 08/27/20 0348  WBC 19.1*  --   CREATININE 4.96* 5.30*    Estimated Creatinine Clearance: 8.8 mL/min (A) (by C-G formula based on SCr of 5.3 mg/dL (H)).    Allergies  Allergen Reactions   Fish-Derived Products Anaphylaxis   Shellfish Allergy Shortness Of Breath    In dye for scan.   Aspirin Nausea Only and Other (See Comments)    Pt stated it only gives her problems when she takes the high dose (325 mg)   Benadryl [Diphenhydramine]     "flips out with it. Eyes roll to the back of her head."   Ivp Dye [Iodinated Diagnostic Agents]     Hx of ? anaphlaxis   Latex Other (See Comments)    Pt says she gets a "stinky infection"    Narda Bonds, PharmD, [place] Pharmacist Phone: [phone]

## 2020-08-27 NOTE — H&P (Signed)
History and Physical    Nicole [name] ATF:[id number] DOB: [birth year] DOA: 08/27/2020  PCP: Nicole Bowen, MD Consultants:  [place] - cardiology; Redmond Baseman - ENT; Delice Lesch - neurology Patient coming from:  Home - lives alone - has been staying with her daughter "since I've been sick"; NOK: Daughter, Theresa Duty, [phone]  Chief Complaint:  Generalized weakness  HPI: [name] is a 75 y.o. female with medical history significant of CAD s/p stent; fibromyalgia; DM; HLD; and depression/anxiety presenting with generalized weakness.   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  She has dizzy spells, doesn't see real well, not driving.  Last night, she was going to the bathroom and got her walker tangled up.  She got very dizzy and fell into the bathtub.  No LOC.  She injured her L hip/knee and R side.  She continued to feel very dizzy.  She doesn't eat or drink well - she tries and is able to drink more than she eats.  She thinks she has lost 30+ pounds since March.  No urinary symptoms.  No respiratory symptoms.    ED Course: Carryover per Dr. Cyd Silence:  75 year old female with medical history for type 2 diabetes mellitus, dyslipidemia, heart failure, coronary disease, GERD, irritable bowel syndrome, chronic vertigo and fibromyalgia presenting with lightheadedness and a fall in the bathroom.  Patient also complains of a several month history of progressively worsening weakness and poor oral intake.  Upon evaluation in the ED, patient found to have a creatinine of 5.5.  Patient also found to have a significant leukocytosis of 19.  Chest x-ray clear.  No focal signs/symptoms on interview/exam.  Patient to be straight cathed for urinalysis.  Of note, patient was admitted late last year for sepsis secondary to urinary tract infection.  I suspect that patient's urine will once again be suggestive of a recurrent complicated UTI.   Review of Systems: As per HPI; otherwise review of systems reviewed  and negative.   Ambulatory Status:  Ambulates with a walker  COVID Vaccine Status:  First shot  Past Medical History:  Diagnosis Date   Anemia    Anxiety    Back pain    CAD (coronary artery disease) 10/22/2019   DES to [facility] August 2021   Colon polyps    COVID-28 June 2019   Endometriosis    Fibromyalgia    GERD with esophagitis    Follows with Dr. 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 mellitus ([place])    Type 2 diabetes mellitus ([place])    Urinary incontinence     Past Surgical History:  Procedure Laterality Date   ABDOMINAL HYSTERECTOMY     TAH BSO   APPENDECTOMY  1999   CATARACT EXTRACTION     both   CHOLECYSTECTOMY     CORONARY STENT INTERVENTION N/A 10/21/2019   Procedure: CORONARY STENT INTERVENTION;  Surgeon: Troy Sine, MD;  Location: [place] [facility];  Service: Cardiovascular;  Laterality: N/A;   DENTAL SURGERY     HERNIA REPAIR  2000   LAPAROSCOPIC ENDOMETRIOSIS FULGURATION     RIGHT/LEFT HEART CATH AND CORONARY ANGIOGRAPHY N/A 10/21/2019   Procedure: RIGHT/LEFT HEART CATH AND CORONARY ANGIOGRAPHY;  Surgeon: Troy Sine, MD;  Location: [place] [facility];  Service: Cardiovascular;  Laterality: N/A;    Social History   Socioeconomic History   Marital status: Widowed    Spouse name: Not on file   Number of children: 1   Years of education: Not on file   Highest education level: Not on file  Occupational History   Occupation: disability  Tobacco Use   Smoking status: Never   Smokeless tobacco: Never  Substance and Sexual Activity   Alcohol use: Not Currently    Comment: occasional   Drug use: No   Sexual activity: Not Currently    Comment: 1st intercourse 75 yo-Fewer than 5 partners  Other Topics Concern   Not on file  Social History Narrative   Not on file   Social  Determinants of Health   Financial Resource Strain: Not on file  Food Insecurity: Not on file  Transportation Needs: Not on file  Physical Activity: Not on file  Stress: Not on file  Social Connections: Not on file  Intimate Partner Violence: Not on file    Allergies  Allergen Reactions   Fish-Derived Products Anaphylaxis   Shellfish Allergy Shortness Of Breath    In dye for scan.   Aspirin Nausea Only and Other (See Comments)    Pt stated it only gives her problems when she takes the high dose (325 mg)   Benadryl [Diphenhydramine]     "flips out with it. Eyes roll to the back of her head."   Ivp Dye [Iodinated Diagnostic Agents]     Hx of ? anaphlaxis   Latex Other (See Comments)    Pt says she gets a "stinky infection"    Family History  Problem Relation Age of Onset   Hypertension Mother    Heart failure Mother    Diabetes Mother    Hypertension Father    Heart failure Father    Diabetes Father    Heart attack Father    Stroke Father    Cancer Brother        lung   Thyroid disease Brother    Cancer Brother        throat   Cancer Paternal Aunt        bone   Cancer Paternal Uncle        lung   Hypertension Maternal Grandmother    Hypertension Maternal Grandfather    Hypertension Paternal Grandmother    Hypertension Paternal Grandfather    Diabetes Daughter    Colon cancer Neg Hx    Stomach cancer Neg Hx     Prior to Admission medications   Medication Sig Start Date End Date Taking? Authorizing Provider  aspirin 81 MG tablet Take 81 mg by mouth daily.    [provider]  atorvastatin (LIPITOR) 80 MG tablet Take 1 tablet (80 mg total) by mouth at bedtime. 11/19/19 01/10/20  Martinique, Peter M, MD  DULoxetine (CYMBALTA) 60 MG capsule Take 60 mg by mouth daily. 08/10/19   [provider]  ergocalciferol (VITAMIN D2) 50000 UNITS capsule Take 50,000 Units by mouth once a week.    [provider]  fluticasone (FLONASE) 50 MCG/ACT nasal spray  Place 1 spray into both nostrils daily as needed for allergies or rhinitis.  05/28/19   [provider]  Fluticasone-Salmeterol (ADVAIR) 250-50 MCG/DOSE AEPB Inhale 1 puff into the lungs 2 (two) times daily.    [provider]  furosemide (LASIX) 40 MG tablet Take 1 tablet (40 mg total) by mouth daily. 02/29/20   Martinique, Peter  M, MD  loratadine (CLARITIN) 10 MG tablet Take 1 tablet (10 mg total) by mouth daily. 06/28/19   Ghimire, Henreitta Leber, MD  LORazepam (ATIVAN) 1 MG tablet Take 1 tablet (1 mg total) by mouth 2 (two) times daily. 12/18/19   Charlynne Cousins, MD  meclizine (ANTIVERT) 25 MG tablet Take 25 mg by mouth daily as needed for dizziness.    [provider]  metoprolol succinate (TOPROL-XL) 50 MG 24 hr tablet Take 1 tablet (50 mg total) by mouth daily. Take with or immediately following a meal. 03/01/20   Martinique, Peter M, MD  [facility] LANCETS 02H MISC  06/03/16   [provider]  [facility] VERIO test strip  06/03/16   [provider]  oxyCODONE-acetaminophen (PERCOCET) 10-325 MG tablet Take 1 tablet by mouth 2 (two) times daily as needed for pain. 12/18/19   Charlynne Cousins, MD  pantoprazole (PROTONIX) 20 MG tablet Take 20 mg by mouth 2 (two) times daily.    [provider]  Potassium Chloride ER 20 MEQ TBCR Take 1 tablet by mouth daily. 12/29/19   [provider]  PROAIR HFA 108 (90 Base) MCG/ACT inhaler Inhale 2 puffs into the lungs every 4 (four) hours as needed for wheezing or shortness of breath. Uses as rescue inhaler Reported on 06/20/2015 06/28/19   Jonetta Osgood, MD  promethazine (PHENERGAN) 12.5 MG tablet Take 1 tablet (12.5 mg total) by mouth every 6 (six) hours as needed for nausea. 01/15/20   Arrien, Jimmy Picket, MD  sacubitril-valsartan (ENTRESTO) 97-103 MG Take 1 tablet by mouth 2 (two) times daily. 04/17/20   Martinique, Peter M, MD  spironolactone (ALDACTONE) 25 MG tablet Take 1 tablet (25 mg total) by  mouth daily. 01/31/20 01/25/21  Martinique, Peter M, MD  ticagrelor (BRILINTA) 90 MG TABS tablet Take 90 mg by mouth 2 (two) times daily.    [provider]    Physical Exam: Vitals:   08/27/20 0915 08/27/20 0930 08/27/20 0945 08/27/20 1000  BP: (!) 93/55 (!) 86/52 (!) 91/51 97/63  Pulse:    92  Resp: 17 18 (!) 21 (!) 27  Temp:      SpO2:    (!) 88%  Weight:      Height:         General:  Appears calm and comfortable and is in NAD, mildly withdrawn Eyes:  PERRL, EOMI, normal lids, iris ENT:  grossly normal hearing, lips; whitish plaque on tongue thought to be due to volume deficiency (if not improving, consider thrush), mildly dry mm; some absent dentition Neck:  no LAD, masses or thyromegaly Cardiovascular:  RRR, no m/r/g. No LE edema.  Respiratory:   CTA bilaterally with no wheezes/rales/rhonchi.  Normal respiratory effort. Abdomen:  soft, NT, ND Skin:  no rash or induration seen on limited exam Musculoskeletal:  grossly normal tone BUE/BLE, good ROM, no bony abnormality Psychiatric:  blunted mood and affect, speech fluent and appropriate, AOx3 Neurologic:  CN 2-12 grossly intact, moves all extremities in coordinated fashion    Radiological Exams on Admission: Independently reviewed - see discussion in A/P where applicable  DG Chest 2 View  Result Date: 08/27/2020 CLINICAL DATA:  75 year old female with shortness of breath. Fall, struck back of head. Progressive weakness for 2 months. EXAM: CHEST - 2 VIEW COMPARISON:  Portable chest 01/10/2020 and earlier. FINDINGS: Upright AP and lateral views of the chest. Small to moderate hiatal hernia is chronic. Other mediastinal contours are within normal limits.  Visualized tracheal air column is within normal limits. Relatively normal lung volumes. Both lungs appear clear. No pneumothorax or pleural effusion. No acute osseous abnormality identified. Stable cholecystectomy clips. Negative visible bowel gas pattern. IMPRESSION: 1. No  acute cardiopulmonary abnormality. 2. Chronic hiatal hernia. Electronically Signed   By: Genevie Ann M.D.   On: 08/27/2020 04:23   CT Head Wo Contrast  Result Date: 08/27/2020 CLINICAL DATA:  75 year old female with shortness of breath. Fall, struck back of head. Progressive weakness for 2 months. EXAM: CT HEAD WITHOUT CONTRAST TECHNIQUE: Contiguous axial images were obtained from the base of the skull through the vertex without intravenous contrast. COMPARISON:  Brain MRI, head CT 01/10/2020, and earlier. FINDINGS: Brain: Stable cerebral volume. No midline shift, ventriculomegaly, mass effect, evidence of mass lesion, intracranial hemorrhage or evidence of cortically based acute infarction. Gray-white matter differentiation is stable and largely normal for age, with only subtle subcortical white matter changes. Vascular: Calcified atherosclerosis at the skull base. No suspicious intracranial vascular hyperdensity. Skull: Stable, intact.  Mild hyperostosis, normal variant. Sinuses/Orbits: Visualized paranasal sinuses and mastoids are stable and well aerated. Tympanic cavities are clear. Other: On series 4, image 49 left lateral convexity scalp hematoma. Underlying calvarium intact. No scalp soft tissue gas. No orbits soft tissue injury identified. IMPRESSION: 1. Left lateral convexity scalp hematoma without underlying skull fracture. 2. Stable and normal for age non contrast CT appearance of the brain. Electronically Signed   By: Genevie Ann M.D.   On: 08/27/2020 04:26   US RENAL  Result Date: 08/27/2020 CLINICAL DATA:  75 year old female with acute renal insufficiency. Sepsis. EXAM: RENAL / URINARY TRACT ULTRASOUND COMPLETE COMPARISON:  CT Abdomen and Pelvis 01/10/2020. FINDINGS: Right Kidney: Renal measurements: 11.0 x 4.6 x 5.5 cm = volume: 144 mL. Echogenicity within normal limits. No mass or hydronephrosis visualized. Left Kidney: Renal measurements: 12.1 x 4.7 x 4.2 cm = volume: 123 mL. Stable simple appearing  left renal midpole cyst, 2.9 cm (image 38). Normal cortical echogenicity and cortical thickness. No hydronephrosis or left renal mass. Bladder: Not visualized, presumably decompressed. Other: None. IMPRESSION: Normal ultrasound appearance of both kidneys. Electronically Signed   By: Genevie Ann M.D.   On: 08/27/2020 11:15    EKG: Independently reviewed.  NSR with rate 93; no evidence of acute ischemia   Labs on Admission: I have personally reviewed the available labs and imaging studies at the time of the admission.  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, now with GFR 9 -Likely volume depleted from lack of PO intake -Appears to be associated with FTT, 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 -Normal renal US -Will admit, hydrate, follow BMP  Failure to thrive -Patient reports 30+ pound weight loss and progressive debility since COVID infection in March -Will request PT/OT/ST/nutrition evaluations and TOC consult -Her family is providing 24/7 caregiver assistance and yet she is requiring more care over time; she may benefit from SNF placement -?depression playing a role -Will request palliative care evaluation  for goals of care   Chronic combined CHF/CAD -Hold Entresto, Aldactone in the setting of AKI -Continue Brilinta  DM -Will check A1c -hold Glucophage, Janumet -Cover with moderate-scale SSI   HTN -Hypotensive on presentation - likely related to volume  deficiency -Hold Toprol XL for now  HLD -Continue Lipitor  Depression/anxiety -Continue Cymbalta, Ativan  Chronic pain -I have reviewed this patient in the [place] Controlled Substances Reporting System.  She is receiving medications from only one provider and appears to be taking them as prescribed. -I have reviewed this patient in the [place] Controlled Substances  -She is at high risk of opioid misuse, diversion, or overdose.  -Continue Percocet for now  Allergies/asthma -Continue Flonase, Claritin, Albuterol     Note: This patient has been tested and is negative for the novel coronavirus COVID-19. The patient has been at least partially vaccinated against COVID-19.   Level of care: Progressive DVT prophylaxis:  Lovenox  Code Status:  Full - confirmed with patient/family Family Communication: None present Disposition Plan:  The patient is from: home  Anticipated d/c is to: be determined  Anticipated d/c date will depend on clinical response to treatment, likely 2-3 days   Patient is currently: acutely ill Consults called: Palliative care; PT/OT/ST/Nutrition; TOC team  Admission status:  Admit - It is my clinical opinion that admission to INPATIENT is reasonable and necessary because of the expectation that this patient will require hospital care that crosses at least 2 midnights to treat this condition based on the medical complexity of the problems presented.  Given the aforementioned information, the predictability of an adverse outcome is felt to be significant.    Karmen Bongo MD [facility]   How to contact the [facility] Attending or Consulting provider [place] or covering provider during after hours [place], for this patient?  Check the care team in [facility] and look for a) attending/consulting TRH provider listed and b) the [facility] team listed Log into [email or web address] and use [place]'s universal password to access. If you do not have the password, please contact the hospital  operator. Locate the [facility] provider you are looking for under [facility] and page to a number that you can be directly reached. If you still have difficulty reaching the provider, please page the [facility] (Director on Call) for the Hospitalists listed on amion for assistance.   08/27/2020, 1:55 PM

## 2020-08-27 NOTE — Consult Note (Signed)
Palliative Medicine Inpatient Consult Note  Reason for consult:  Goals of Care  HPI:  Per intake H&P --> Nicole MUCHA is a 75 y.o. female with medical history significant of CAD s/p stent; fibromyalgia; DM; HLD; and depression/anxiety presenting with generalized weakness.   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  She has dizzy spells, doesn't see real well, not driving.  Last night, she was going to the bathroom and got her walker tangled up.  She got very dizzy and fell into the bathtub.  No LOC.  She injured her L hip/knee and R side.  She continued to feel very dizzy.  She doesn't eat or drink well - she tries and is able to drink more than she eats.  She thinks she has lost 30+ pounds since March.    Palliative care has been asked to consult on Nicole [name] in the setting of weight loss and chronic co-morbidities to further address goals of care.  Clinical Assessment/Goals of Care:  *Please note that this is a verbal dictation therefore any spelling or grammatical errors are due to the "[place]" system interpretation.  I have reviewed medical records including EPIC notes, labs and imaging, received report from bedside RN, assessed the patient who was lying in bed in no acute distress.    I met with Nicole [name] to further discuss diagnosis prognosis, GOC, EOL wishes, disposition and options.   I introduced Palliative Medicine as specialized medical care for people living with serious illness. It focuses on providing relief from the symptoms and stress of a serious illness. The goal is to improve quality of life for both the patient and the family.  Nicole [name] shares that she is from [place] originally though she has lived in [place], [place] mostly. She is divorced and has one daughter, Nicole [name] who lives with her in her trailer. 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 For enjoyment she use to like to take drives in the country and croquet. She is a woman of  faith and practices within the [employer] denomination.  Prior to hospitalization Nicole [name] was able to mobilize with a walker. She had prior to being infected with COVID-19 in April of last year been able to care for herself well. She has since then though experienced tremendous physical declines and attributes her heart and kidney disease to prior infection. Nicole [name] has lost > 30 pounds per self report and has little to no appetite. She shares that she has a lack of taste for food when she does consume it.   Jamilyn is no longer driving and relies on her daughter, Nicole [name] for help with most bADL's. She expresses tremendous worry about what the future will hold.   A detailed discussion was had today regarding advanced directives - Nicole [name] has never completed these though she shares if unable to make decisions for herself she would rely on her daughter, Nicole [name] to make decisions for her.    Concepts specific to code status, artifical feeding and hydration, continued IV antibiotics and rehospitalization was had.  A MOST form was introduced and completed at below:  Cardiopulmonary Resuscitation: Do Not Attempt Resuscitation (DNR/No CPR)  Medical Interventions: Limited Additional Interventions: Use medical treatment, IV fluids and cardiac monitoring as indicated, DO NOT USE intubation or mechanical ventilation. May consider use of less invasive airway support such as BiPAP or CPAP. Also provide comfort measures. Transfer to the hospital if indicated. Avoid intensive care.   Antibiotics: Antibiotics  if indicated  IV Fluids: IV fluids for a defined trial period  Feeding Tube: Feeding tube for a defined trial period   We discussed artificial nutrition for quite sometime as patients brother had a G-Tube during his long battle with cancer. Nicole [name] felt it enhanced the life he was living and she is open to a trial of tube feeding if this is needed moving forward. The only caveat to this she shared was she would need to be at  the least as functional as she is presently.  The difference between a aggressive medical intervention path  and a palliative comfort care path for this patient at this time was had. Values and goals of care important to patient and family were attempted to be elicited. Nicole [name] shares that her goals as it stands presently are to "get stronger". She hopes to continue living for as long as she can though emphasizes that she wants quality in her life. She expresses since being afflicted with COVID her quality has declined.   Reviewed that she would benefit from having an outpatient Palliative provider follow along with her after she discharges which she is in agreement with.   Discussed the importance of continued conversation with family and their  medical providers regarding overall plan of care and treatment options, ensuring decisions are within the context of the patients values and GOCs.  Provided "Hard Choices for [employer]" booklet.   Decision Maker: Patient can make decisions for herself. If she were for any reason unable to care for herself she would rely on her daughter, Nicole [name] to make decisions for her.  SUMMARY OF RECOMMENDATIONS   DNAR/DNI  MOST Completed, paper copy placed onto the chart electric copy can be found in [facility]  DNR Form Completed, paper copy placed onto the chart electric copy can be found in [facility]  TOC - OP Palliative Support  Ongoing conversations during hospitalization   Code Status/Advance Care Planning: DNAR/DNI   Symptom Management:  Failure to Thrive:  - Dietician involved  - Encourage 1:1 feeding  - Supplemental nutrients  Muscular Weakness:                 - Physical Therapy Evaluation                 - Occupational Therapy Evaluation  Palliative Prophylaxis:  Oral Care, Mobility  Additional Recommendations (Limitations, Scope, Preferences): Continue current scope of care  Psycho-social/Spiritual:  Desire for further Chaplaincy  support: Yes Additional Recommendations: Education on chronic disease processes   Prognosis: Unclear - recent weight loss is worrisome. Will depend on present WU to potentially identify underlying cause.   Discharge Planning: Unclear.  Vitals:   08/27/20 1415 08/27/20 1430  BP: (!) 94/47 (!) 91/49  Pulse:    Resp: (!) 29 (!) 25  Temp:    SpO2:      Intake/Output Summary (Last 24 hours) at 08/27/2020 1523 Last data filed at 08/27/2020 1001 Gross per 24 hour  Intake 3122.34 ml  Output --  Net 3122.34 ml   Last Weight  Most recent update: 08/27/2020  3:24 AM    Weight  69.5 kg (153 lb 3.5 oz)            Gen:  Elderly F in NAD HEENT: moist mucous membranes CV: Regular rate and rhythm  PULM: On [place] ABD: soft/nontender  EXT: No edema  Neuro: Alert and oriented x3   PPS: 50%   This conversation/these recommendations were discussed with  patient primary care team, Dr. Lorin Mercy  Time In: 1600 Time Out: 1710 Total Time: 72 Greater than 50%  of this time was spent counseling and coordinating care related to the above assessment and plan.  [place] Team Team Cell Phone: [phone] Please utilize secure chat with additional questions, if there is no response within 30 minutes please call the above phone number  Palliative Medicine Team providers are available by phone from 7am to 7pm daily and can be reached through the team cell phone.  Should this patient require assistance outside of these hours, please call the patient's attending physician.

## 2020-08-27 NOTE — ED Provider Notes (Addendum)
[name] EM[facility]Y DEPARTMENT Provider Note   CSN: [id number] Arrival date & time: 08/27/20  0257     History Chief Complaint  Patient presents with   Lytle Michaels    Nicole [name] is a 75 y.o. female.  HPI     This is a 75 year old female with a history of CHF, coronary artery disease, diabetes, hyperlipidemia who presents with generalized weakness.  Patient reports she got dizzy in the bathtub and fell.  She denies room spinning dizziness.  She has not felt well and has had progressive weakness over the last several months.  She uses a walker at home.  She does report that when she fell she hit her head.  She did not lose consciousness.  Has had some new shortness of breath.  No fever, cough, chest pain, abdominal pain, nausea, vomiting.  No recent changes in medications.  Denies urinary symptoms.  Past Medical History:  Diagnosis Date   Anemia    Anxiety    Back pain    CAD (coronary artery disease) 10/22/2019   [facility] to [facility] August 2021   Colon polyps    COVID-28 June 2019   Endometriosis    Fibromyalgia    GERD with esophagitis    Follows with Dr. 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 mellitus ([place])    Obesity    Type 2 diabetes mellitus ([place])    Urinary incontinence     Patient Active Problem List   Diagnosis Date Noted   Sepsis secondary to UTI ([place]) 01/11/2020   Sepsis ([place]) 01/10/2020   Non-ST elevation (NSTEMI) myocardial infarction [name])    Acute respiratory failure with hypoxia ([place])    Benzodiazepine withdrawal with complication ([place])    Severe sepsis ([place]) 12/12/2019   Hyperammonemia ([place]) 12/12/2019   Chest pain in adult 12/12/2019   Nystagmus 12/11/2019   Leukocytosis 12/11/2019   Chronic combined systolic and diastolic congestive heart failure ([place]) 12/11/2019   Type 2  diabetes mellitus ([place]) 12/11/2019   Acute combined systolic and diastolic congestive heart failure ([place]) 10/22/2019   CAD S/P percutaneous coronary angioplasty 10/22/2019   Aphasia 10/21/2019   Elevated troponin    Cardiomyopathy (HCC)    DOE (dyspnea on exertion) 10/16/2019   Mild intermittent asthma with (acute) exacerbation 06/24/2019   COVID-19 virus infection 76/28/3151   Acute metabolic encephalopathy 76/16/0737   Hypokalemia, inadequate intake 06/24/2019   Hypomagnesemia 06/24/2019   GERD with esophagitis    Major depressive disorder    Mixed diabetic hyperlipidemia associated with type 2 diabetes mellitus ([place])    Chronic daily headache 12/07/2014   Memory loss 08/31/2014   Worsening headaches 08/31/2014   Atypical chest pain 11/05/2013   Anemia    Anxiety    Depression    Diabetes mellitus type 2 in obese ([place])    Urinary incontinence    Endometriosis    Asthma    Fibromyalgia     Past Surgical History:  Procedure Laterality Date   ABDOMINAL HYSTERECTOMY     TAH BSO   APPENDECTOMY  1999   CATARACT EXTRACTION     both   CHOLECYSTECTOMY     CORONARY STENT INTERVENTION N/A 10/21/2019   Procedure: CORONARY STENT INTERVENTION;  Surgeon: Troy Sine, MD;  Location: [facility];  Service: Cardiovascular;  Laterality: N/A;   DENTAL SURGERY     HERNIA REPAIR  2000   LAPAROSCOPIC ENDOMETRIOSIS FULGURATION     RIGHT/LEFT HEART CATH AND CORONARY ANGIOGRAPHY N/A 10/21/2019   Procedure: RIGHT/LEFT HEART CATH AND CORONARY ANGIOGRAPHY;  Surgeon: Troy Sine, MD;  Location: [place] [facility];  Service: Cardiovascular;  Laterality: N/A;     OB History     Gravida  1   Para  1   Term      Preterm      AB      Living  1      SAB      IAB      Ectopic      Multiple      Live Births              Family History  Problem Relation Age of Onset   Hypertension Mother    Heart failure Mother    Diabetes Mother    Hypertension Father     Heart failure Father    Diabetes Father    Heart attack Father    Stroke Father    Cancer Brother        lung   Thyroid disease Brother    Cancer Brother        throat   Cancer Paternal Aunt        bone   Cancer Paternal Uncle        lung   Hypertension Maternal Grandmother    Hypertension Maternal Grandfather    Hypertension Paternal Grandmother    Hypertension Paternal Grandfather    Diabetes Daughter    Colon cancer Neg Hx    Stomach cancer Neg Hx     Social History   Tobacco Use   Smoking status: Never   Smokeless tobacco: Never  Substance Use Topics   Alcohol use: Yes    Alcohol/week: 0.0 standard drinks    Comment: occasional   Drug use: No    Home Medications Prior to Admission medications   Medication Sig Start Date End Date Taking? Authorizing Provider  aspirin 81 MG tablet Take 81 mg by mouth daily.    [provider]  atorvastatin (LIPITOR) 80 MG tablet Take 1 tablet (80 mg total) by mouth at bedtime. 11/19/19 01/10/20  [place], Peter M, MD  DULoxetine (CYMBALTA) 60 MG capsule Take 60 mg by mouth daily. 08/10/19   [provider]  ergocalciferol (VITAMIN D2) 50000 UNITS capsule Take 50,000 Units by mouth once a week.    [provider]  fluticasone (FLONASE) 50 MCG/ACT nasal spray Place 1 spray into both nostrils daily as needed for allergies or rhinitis.  05/28/19   [provider]  Fluticasone-Salmeterol (ADVAIR) 250-50 MCG/DOSE AEPB Inhale 1 puff into the lungs 2 (two) times daily.    [provider]  furosemide (LASIX) 40 MG tablet Take 1 tablet (40 mg total) by mouth daily. 02/29/20   [place], Peter M, MD  loratadine (CLARITIN) 10 MG tablet Take 1 tablet (10 mg total) by mouth daily. 06/28/19   Ghimire, Henreitta Leber, MD  LORazepam (ATIVAN) 1 MG tablet Take 1 tablet (1 mg total) by mouth 2 (two) times daily. 12/18/19   Charlynne Cousins, MD  meclizine (ANTIVERT) 25 MG tablet Take 25 mg by mouth daily as needed for  dizziness.    [provider]  metoprolol succinate (TOPROL-XL) 50 MG 24 hr tablet Take 1 tablet (50 mg total)  by mouth daily. Take with or immediately following a meal. 03/01/20   [place], Peter M, MD  [employer] LANCETS 50Y MISC  06/03/16   [provider]  [facility] VERIO test strip  06/03/16   [provider]  oxyCODONE-acetaminophen (PERCOCET) 10-325 MG tablet Take 1 tablet by mouth 2 (two) times daily as needed for pain. 12/18/19   Charlynne Cousins, MD  pantoprazole (PROTONIX) 20 MG tablet Take 20 mg by mouth 2 (two) times daily.    [provider]  Potassium Chloride ER 20 MEQ TBCR Take 1 tablet by mouth daily. 12/29/19   [provider]  PROAIR HFA 108 (90 Base) MCG/ACT inhaler Inhale 2 puffs into the lungs every 4 (four) hours as needed for wheezing or shortness of breath. Uses as rescue inhaler Reported on 06/20/2015 06/28/19   Jonetta Osgood, MD  promethazine (PHENERGAN) 12.5 MG tablet Take 1 tablet (12.5 mg total) by mouth every 6 (six) hours as needed for nausea. 01/15/20   Arrien, Jimmy Picket, MD  sacubitril-valsartan (ENTRESTO) 97-103 MG Take 1 tablet by mouth 2 (two) times daily. 04/17/20   [place]ique, Peter M, MD  spironolactone (ALDACTONE) 25 MG tablet Take 1 tablet (25 mg total) by mouth daily. 01/31/20 01/25/21  [place], Peter M, MD  ticagrelor (BRILINTA) 90 MG TABS tablet Take 90 mg by mouth 2 (two) times daily.    [provider]    Allergies    Fish-derived products, Shellfish allergy, Aspirin, Benadryl [diphenhydramine], Ivp dye [iodinated diagnostic agents], and Latex  Review of Systems   Review of Systems  Constitutional:  Negative for fever.  Respiratory:  Negative for shortness of breath.   Cardiovascular:  Negative for chest pain.  Gastrointestinal:  Negative for abdominal pain, nausea and vomiting.  Genitourinary:  Negative for dysuria.  Musculoskeletal:  Negative for back pain.  Neurological:  Positive for  weakness and light-headedness.  All other systems reviewed and are negative.  Physical Exam Updated Vital Signs BP 106/63   Pulse 81   Temp 98 F (36.7 C)   Resp 18   Ht 1.575 m (5\' 2" )   Wt 69.5 kg   SpO2 100%   BMI 28.02 kg/m   Physical Exam Vitals and nursing note reviewed.  Constitutional:      Appearance: She is well-developed. She is ill-appearing. She is not toxic-appearing.  HENT:     Head: Normocephalic and atraumatic.     Mouth/Throat:     Mouth: Mucous membranes are dry.  Eyes:     Pupils: Pupils are equal, round, and reactive to light.  Cardiovascular:     Rate and Rhythm: Normal rate and regular rhythm.     Heart sounds: Normal heart sounds.  Pulmonary:     Effort: Pulmonary effort is normal. No respiratory distress.     Breath sounds: No wheezing.  Abdominal:     General: Bowel sounds are normal.     Palpations: Abdomen is soft.     Tenderness: There is no abdominal tenderness.  Musculoskeletal:     Cervical back: Neck supple.     Right lower leg: No edema.     Left lower leg: No edema.  Skin:    General: Skin is warm and dry.     Comments: Pale  Neurological:     Mental Status: She is alert and oriented to person, place, and time.     Comments: 5 out of 5 strength in all 4 extremities, no dysmetria to finger-nose-finger, cranial  nerves II through XII intact   Psychiatric:     Comments: Flat affect    ED Results / Procedures / Treatments   Labs (all labs ordered are listed, but only abnormal results are displayed) Labs Reviewed  CBC WITH DIFFERENTIAL/PLATELET - Abnormal; Notable for the following components:      Result Value   WBC 19.1 (*)    Neutro Abs 14.8 (*)    Monocytes Absolute 1.2 (*)    Abs Immature Granulocytes 0.34 (*)    All other components within normal limits  COMPREHENSIVE METABOLIC PANEL - Abnormal; Notable for the following components:   Sodium 134 (*)    Potassium 2.9 (*)    CO2 16 (*)    Glucose, Bld 162 (*)    BUN  106 (*)    Creatinine, Ser 4.96 (*)    Calcium 8.2 (*)    Total Bilirubin 1.3 (*)    GFR, Estimated 9 (*)    Anion gap 19 (*)    All other components within normal limits  I-STAT CHEM 8, ED - Abnormal; Notable for the following components:   Potassium 2.9 (*)    BUN 121 (*)    Creatinine, Ser 5.30 (*)    Glucose, Bld 159 (*)    Calcium, Ion 0.96 (*)    TCO2 17 (*)    All other components within normal limits  CBG MONITORING, ED - Abnormal; Notable for the following components:   Glucose-Capillary 108 (*)    All other components within normal limits  CULTURE, BLOOD (SINGLE)  RESP PANEL BY RT-PCR (FLU A&B, COVID) [id number]  PROTIME-INR  URINALYSIS, ROUTINE W REFLEX MICROSCOPIC  LACTIC ACID, PLASMA  LACTIC ACID, PLASMA    EKG EKG Interpretation  Date/Time:  Sunday August 27 2020 03:10:02 EDT Ventricular Rate:  93 PR Interval:  156 QRS Duration: 104 QT Interval:  382 QTC Calculation: 474 R Axis:   262 Text Interpretation: Sinus rhythm with Premature atrial complexes Right superior axis deviation Abnormal ECG Confirmed by Thayer Jew [phone]) on 08/27/2020 4:19:54 AM  Radiology DG Chest 2 View  Result Date: 08/27/2020 CLINICAL DATA:  75 year old female with shortness of breath. Fall, struck back of head. Progressive weakness for 2 months. EXAM: CHEST - 2 VIEW COMPARISON:  Portable chest 01/10/2020 and earlier. FINDINGS: Upright AP and lateral views of the chest. Small to moderate hiatal hernia is chronic. Other mediastinal contours are within normal limits. Visualized tracheal air column is within normal limits. Relatively normal lung volumes. Both lungs appear clear. No pneumothorax or pleural effusion. No acute osseous abnormality identified. Stable cholecystectomy clips. Negative visible bowel gas pattern. IMPRESSION: 1. No acute cardiopulmonary abnormality. 2. Chronic hiatal hernia. Electronically Signed   By: Genevie Ann M.D.   On: 08/27/2020 04:23   CT Head Wo Contrast  Result  Date: 08/27/2020 CLINICAL DATA:  75 year old female with shortness of breath. Fall, struck back of head. Progressive weakness for 2 months. EXAM: CT HEAD WITHOUT CONTRAST TECHNIQUE: Contiguous axial images were obtained from the base of the skull through the vertex without intravenous contrast. COMPARISON:  Brain MRI, head CT 01/10/2020, and earlier. FINDINGS: Brain: Stable cerebral volume. No midline shift, ventriculomegaly, mass effect, evidence of mass lesion, intracranial hemorrhage or evidence of cortically based acute infarction. Gray-white matter differentiation is stable and largely normal for age, with only subtle subcortical white matter changes. Vascular: Calcified atherosclerosis at the skull base. No suspicious intracranial vascular hyperdensity. Skull: Stable, intact.  Mild hyperostosis, normal variant. Sinuses/Orbits: Visualized  paranasal sinuses and mastoids are stable and well aerated. Tympanic cavities are clear. Other: On series 4, image 49 left lateral convexity scalp hematoma. Underlying calvarium intact. No scalp soft tissue gas. No orbits soft tissue injury identified. IMPRESSION: 1. Left lateral convexity scalp hematoma without underlying skull fracture. 2. Stable and normal for age non contrast CT appearance of the brain. Electronically Signed   By: Genevie Ann M.D.   On: 08/27/2020 04:26    Procedures .Critical Care  Date/Time: 08/27/2020 6:00 AM Performed by: Merryl Hacker, MD Authorized by: Merryl Hacker, MD   Critical care provider statement:    Critical care time (minutes):  54   Critical care was time spent personally by me on the following activities:  Discussions with consultants, evaluation of patient's response to treatment, examination of patient, ordering and performing treatments and interventions, ordering and review of laboratory studies, ordering and review of radiographic studies, pulse oximetry, re-evaluation of patient's condition, obtaining history from  patient or surrogate and review of old charts   Medications Ordered in ED Medications  sodium chloride 0.9 % bolus 500 mL (has no administration in time range)  lactated ringers infusion (has no administration in time range)  sodium chloride 0.9 % bolus 1,000 mL (has no administration in time range)    And  sodium chloride 0.9 % bolus 1,000 mL (1,000 mLs Intravenous New Bag/Given 08/27/20 0538)    And  sodium chloride 0.9 % bolus 250 mL (has no administration in time range)  ceFEPIme (MAXIPIME) 2 g in sodium chloride 0.9 % 100 mL IVPB (2 g Intravenous New Bag/Given 08/27/20 0543)  metroNIDAZOLE (FLAGYL) IVPB 500 mg (500 mg Intravenous New Bag/Given 08/27/20 0545)  vancomycin (VANCOREADY) IVPB 1500 mg/300 mL (has no administration in time range)  potassium chloride 10 mEq in 100 mL IVPB (has no administration in time range)  ondansetron (ZOFRAN) injection 4 mg (has no administration in time range)    ED Course  I have reviewed the triage vital signs and the nursing notes.  Pertinent labs & imaging results that were available during my care of the patient were reviewed by me and considered in my medical decision making (see chart for details).  Clinical Course as of 08/27/20 0602  Sun Aug 27, 2020  0555 Daughter is now at bedside.  Reports that generally her mother has not done well since contracting COVID-19 in April 2021.  She has been generally weak.  She has episodes of lightheadedness although more persistent in the last several weeks.  Blood pressure has improved with minimal fluid resuscitation.  We will await lactate and continue fluid resuscitation until this information is back.  She has leukocytosis suggestive of sepsis.  She is given broad-spectrum antibiotics. [CH]    Clinical Course User Index [CH] Mykenzi Vanzile, Barbette Hair, MD   MDM Rules/Calculators/A&P                          Patient presents with an episode of lightheadedness and a fall.  She is ill-appearing but nontoxic on  exam.  She is initially afebrile although her blood pressure is 84/66.  She appears pale.  She is nonfocal.  Lab work obtained.  Patient was given IV fluids.  She has a leukocytosis to 19.1 with a left shift.  For this reason, sepsis work-up was pursued.  Given low blood pressures, 30 cc/kg was initiated.  Patient does have a history of CHF with an EF of  25%.  Will await lactate and be more judicious if lactate is normal.  Chest x-ray shows no evidence of pneumonia.  Requested and out cath as patient has a history of UTI.  Blood pressure improved with a small amount of fluids.  On my repeat evaluation, heart rate stable.  Blood pressure 106/63.  From a metabolic standpoint, patient with significant AKI with a creatinine of almost 5.  She reports that she is urinating.  Additionally, she has a low potassium at 2.9.  This was replaced.  CT head was obtained given fall.  No evidence of intracranial bleed.  This was independently reviewed by myself.  Plan for admission to the hospitalist.  Final Clinical Impression(s) / ED Diagnoses Final diagnoses:  AKI (acute kidney injury) ([place])  Severe sepsis ([place])  Hypokalemia    Rx / DC Orders ED Discharge Orders     None        Hovanes Hymas, Barbette Hair, MD 08/27/20 0600    Dina Rich, Barbette Hair, MD 08/27/20 [id number]    Merryl Hacker, MD 08/27/20 [phone]

## 2020-08-27 NOTE — ED Triage Notes (Signed)
The pt fell at home  striking the back of her head  she  responds to questions  asked that her daughter always answers all questions and she will soon be here,  she does not answer how the accident occurred

## 2020-08-27 NOTE — Progress Notes (Signed)
Elink monitoring for sepsis protocol 

## 2020-08-28 LAB — BASIC METABOLIC PANEL
Anion gap: 8 (ref 5–15)
BUN: 72 mg/dL — ABNORMAL HIGH (ref 8–23)
CO2: 16 mmol/L — ABNORMAL LOW (ref 22–32)
Calcium: 7.1 mg/dL — ABNORMAL LOW (ref 8.9–10.3)
Chloride: 116 mmol/L — ABNORMAL HIGH (ref 98–111)
Creatinine, Ser: 2.45 mg/dL — ABNORMAL HIGH (ref 0.44–1.00)
GFR, Estimated: 20 mL/min — ABNORMAL LOW (ref >60)
Glucose, Bld: 93 mg/dL (ref 70–99)
Potassium: 3.1 mmol/L — ABNORMAL LOW (ref 3.5–5.1)
Sodium: 140 mmol/L (ref 135–145)

## 2020-08-28 LAB — GLUCOSE, CAPILLARY
Glucose-Capillary: 106 mg/dL — ABNORMAL HIGH (ref 70–99)
Glucose-Capillary: 89 mg/dL (ref 70–99)

## 2020-08-28 LAB — CBC
HCT: 29.5 % — ABNORMAL LOW (ref 36.0–46.0)
Hemoglobin: 10.4 g/dL — ABNORMAL LOW (ref 12.0–15.0)
MCH: 32.1 pg (ref 26.0–34.0)
MCHC: 35.3 g/dL (ref 30.0–36.0)
MCV: 91 fL (ref 80.0–100.0)
Platelets: 181 10*3/uL (ref 150–400)
RBC: 3.24 MIL/uL — ABNORMAL LOW (ref 3.87–5.11)
RDW: 12.8 % (ref 11.5–15.5)
WBC: 6.8 10*3/uL (ref 4.0–10.5)
nRBC: 0 % (ref 0.0–0.2)

## 2020-08-28 LAB — MAGNESIUM: Magnesium: 0.5 mg/dL — CL (ref 1.7–2.4)

## 2020-08-28 LAB — MRSA NEXT GEN BY PCR, NASAL: MRSA by PCR Next Gen: NOT DETECTED

## 2020-08-28 MED ORDER — ADULT MULTIVITAMIN W/MINERALS CH
1.0000 | ORAL_TABLET | Freq: Every day | ORAL | Status: DC
  Administered 2020-08-28 – 2020-08-30 (×3): 1 via ORAL
  Filled 2020-08-28 (×3): qty 1

## 2020-08-28 MED ORDER — TRAZODONE HCL 50 MG PO TABS
50.0000 mg | ORAL_TABLET | Freq: Every day | ORAL | Status: DC
  Administered 2020-08-28 – 2020-08-29 (×2): 50 mg via ORAL
  Filled 2020-08-28 (×2): qty 1

## 2020-08-28 MED ORDER — MAGNESIUM SULFATE 4 GM/100ML IV SOLN
4.0000 g | Freq: Once | INTRAVENOUS | Status: AC
  Administered 2020-08-28: 4 g via INTRAVENOUS
  Filled 2020-08-28: qty 100

## 2020-08-28 MED ORDER — ENSURE ENLIVE PO LIQD
237.0000 mL | Freq: Three times a day (TID) | ORAL | Status: DC
  Administered 2020-08-28 – 2020-08-30 (×6): 237 mL via ORAL

## 2020-08-28 MED ORDER — INSULIN ASPART 100 UNIT/ML IJ SOLN
0.0000 [IU] | Freq: Three times a day (TID) | INTRAMUSCULAR | Status: DC
  Administered 2020-08-29: 2 [IU] via SUBCUTANEOUS

## 2020-08-28 MED ORDER — POTASSIUM CHLORIDE CRYS ER 20 MEQ PO TBCR
40.0000 meq | EXTENDED_RELEASE_TABLET | ORAL | Status: AC
  Administered 2020-08-28 (×2): 40 meq via ORAL
  Filled 2020-08-28 (×2): qty 2

## 2020-08-28 NOTE — Evaluation (Signed)
Physical Therapy Evaluation Patient Details Name: Nicole [name] MRN: [id number] DOB: 1945-12-24 Today's Date: 08/28/2020   History of Present Illness  75 yo female presents to [facility] on 6/20 with fall in bathtub secondary to dizzy spell. CTH shos L lateral convexity scalp hematoma without underlying skull fx. PMH includes CAD s/p stent, covid 05/2019 with persistent dizzinesss and FTT, fibromyalgia, DM, HLD, HF, MDD, endometriosis, and depression/anxiety.  Clinical Impression   Pt presents with generalized weakness, impaired balance, + dizziness especially with positional change (see vitals below), history of falls, and decreased activity tolerance vs baseline. Pt to benefit from acute PT to address deficits. Pt overall requiring min assist for bed mobility and transfer to standing, pt limited by orthostatic hypotension. PT to progress mobility as tolerated, and will continue to follow acutely.      08/28/20 0807  Orthostatic Lying   BP- Lying 107/55  Pulse- Lying 80  Orthostatic Sitting  BP- Sitting 95/58  Pulse- Sitting 88  Orthostatic Standing at 0 minutes  BP- Standing at 0 minutes (!) 74/62  Pulse- Standing at 0 minutes 88      Follow Up Recommendations Home health PT;Supervision for mobility/OOB    Equipment Recommendations  None recommended by PT    Recommendations for Other Services       Precautions / Restrictions Precautions Precautions: Fall Precaution Comments: 4-5 falls in the past 6 months Restrictions Weight Bearing Restrictions: No      Mobility  Bed Mobility Overal bed mobility: Needs Assistance Bed Mobility: Supine to Sit;Sit to Supine     Supine to sit: Min assist Sit to supine: Min assist   General bed mobility comments: min assist for LE management, boost up in bed upon return to supine.    Transfers Overall transfer level: Needs assistance Equipment used: Rolling walker (2 wheeled) Transfers: Sit to/from Stand Sit to Stand: Min assist;From  elevated surface         General transfer comment: min assist for power up, posterior facilitation for rise, steadying upon standing. Posterior bias, standing tolerance x1 min  Ambulation/Gait             General Gait Details: lateral stepping to [facility] only  Stairs            Wheelchair Mobility    Modified Rankin (Stroke Patients Only)       Balance Overall balance assessment: Needs assistance Sitting-balance support: No upper extremity supported;Feet supported Sitting balance-Leahy Scale: Fair   Postural control: Posterior lean Standing balance support: Bilateral upper extremity supported Standing balance-Leahy Scale: Poor Standing balance comment: reliant on external support                             Pertinent Vitals/Pain Pain Assessment: Faces Faces Pain Scale: Hurts little more Pain Location: "all over" Pain Descriptors / Indicators: Sore Pain Intervention(s): Limited activity within patient's tolerance;Monitored during session;Repositioned    Home Living Family/patient expects to be discharged to:: Private residence Living Arrangements: Alone Available Help at Discharge: Family;Available 24 hours/day (daughter is living with pt presently) Type of Home: Mobile home Home Access: Ramped entrance     Home Layout: One level Home Equipment: Walker - 4 wheels      Prior Function Level of Independence: Needs assistance   Gait / Transfers Assistance Needed: pt reports using rollator for mobility PTA           Hand Dominance   Dominant Hand: Right  Extremity/Trunk Assessment   Upper Extremity Assessment Upper Extremity Assessment: Defer to OT evaluation    Lower Extremity Assessment Lower Extremity Assessment: Generalized weakness    Cervical / Trunk Assessment Cervical / Trunk Assessment: Normal  Communication   Communication: No difficulties  Cognition Arousal/Alertness: Awake/alert Behavior During Therapy: WFL for  tasks assessed/performed Overall Cognitive Status: Within Functional Limits for tasks assessed                                        General Comments General comments (skin integrity, edema, etc.): + orthostatic hypotension, Dr. Maylene Roes and RN notified    Exercises     Assessment/Plan    PT Assessment Patient needs continued PT services  PT Problem List Decreased strength;Decreased mobility;Decreased activity tolerance;Decreased balance;Decreased knowledge of use of DME;Pain;Decreased safety awareness;Cardiopulmonary status limiting activity       PT Treatment Interventions DME instruction;Therapeutic activities;Gait training;Therapeutic exercise;Patient/family education;Balance training;Stair training;Functional mobility training;Neuromuscular re-education    PT Goals (Current goals can be found in the Care Plan section)  Acute Rehab PT Goals Patient Stated Goal: go home PT Goal Formulation: With patient Time For Goal Achievement: 09/11/20 Potential to Achieve Goals: Good    Frequency Min 3X/week   Barriers to discharge        Co-evaluation               AM-PAC PT "6 Clicks" Mobility  Outcome Measure Help needed turning from your back to your side while in a flat bed without using bedrails?: A Little Help needed moving from lying on your back to sitting on the side of a flat bed without using bedrails?: A Little Help needed moving to and from a bed to a chair (including a wheelchair)?: A Little Help needed standing up from a chair using your arms (e.g., wheelchair or bedside chair)?: A Little Help needed to walk in hospital room?: A Little Help needed climbing 3-5 steps with a railing? : A Lot 6 Click Score: 17    End of Session   Activity Tolerance: Patient limited by fatigue;Other (comment) (+orthostasis) Patient left: in bed;with call bell/phone within reach;with bed alarm set;with nursing/sitter in room Nurse Communication: Mobility status PT  Visit Diagnosis: Other abnormalities of gait and mobility (R26.89);Difficulty in walking, not elsewhere classified (R26.2)    Time: 0805-0829 PT Time Calculation (min) (ACUTE ONLY): 24 min   Charges:   PT Evaluation $PT Eval Low Complexity: 1 Low PT Treatments $Therapeutic Activity: 8-22 mins       Stacie Glaze, PT DPT Acute Rehabilitation Services Pager [phone]  Office [phone]   [name] 08/28/2020, 8:40 AM

## 2020-08-28 NOTE — Evaluation (Signed)
Occupational Therapy Evaluation Patient Details Name: Nicole [name] MRN: [id number] DOB: 08/22/1945 Today's Date: 08/28/2020    History of Present Illness 75 yo female presents to [facility] on [facility] with fall in bathtub secondary to dizzy spell. CTH shos L lateral convexity scalp hematoma without underlying skull fx. PMH includes CAD s/p stent, covid 05/2019 with persistent dizzinesss and FTT, fibromyalgia, DM, HLD, HF, MDD, endometriosis, and depression/anxiety.   Clinical Impression   PTA, daughter has been staying with pt. Pt reports Modified Independence with ADLs and mobility using Rollator. Daughter assists with IADLs. Pt reports 2 falls since Jan 2022. Pt presents now with deficits in strength, endurance, standing balance, and pain after fall at home. Pt overall min guard for sit to stand trials with RW during ADLs. Pt requires Setup for UB ADLs and min guard for LB ADLs at this time. Pt reports minor dizziness with standing. Educated on fall prevention strategies and energy conservation to maximize independence at home. Anticipate pt to progress quickly with HHOT follow-up indicated. Plan to progress standing tolerance for ADLs and mobility in next sessions.     Follow Up Recommendations  Home health OT;Supervision - Intermittent    Equipment Recommendations  None recommended by OT    Recommendations for Other Services       Precautions / Restrictions Precautions Precautions: Fall;Other (comment) Precaution Comments: monitor BP Restrictions Weight Bearing Restrictions: No      Mobility Bed Mobility Overal bed mobility: Needs Assistance Bed Mobility: Supine to Sit;Sit to Supine     Supine to sit: Supervision;HOB elevated Sit to supine: Supervision   General bed mobility comments: Supervision for safety, no assist needed    Transfers Overall transfer level: Needs assistance Equipment used: Rolling walker (2 wheeled) Transfers: Sit to/from Stand Sit to Stand: Min guard          General transfer comment: min guard for safety in power up    Balance Overall balance assessment: Needs assistance Sitting-balance support: No upper extremity supported;Feet supported Sitting balance-Leahy Scale: Fair     Standing balance support: No upper extremity supported;During functional activity Standing balance-Leahy Scale: Fair Standing balance comment: fair static standing but reliant on UE support for dynamic tasks                           ADL either performed or assessed with clinical judgement   ADL Overall ADL's : Needs assistance/impaired Eating/Feeding: Independent;Sitting   Grooming: Min guard;Standing Grooming Details (indicate cue type and reason): Brushed hair/washed face sitting EOB Upper Body Bathing: Set up;Sitting Upper Body Bathing Details (indicate cue type and reason): Setup to bathe under arms sitting EOB Lower Body Bathing: Min guard;Sit to/from stand Lower Body Bathing Details (indicate cue type and reason): min guard for bathing peri region in standing, no UE support Upper Body Dressing : Set up;Sitting   Lower Body Dressing: Min guard;Sit to/from stand   Toilet Transfer: Min guard;Stand-pivot;BSC;RW   Toileting- Water quality scientist and Hygiene: Min guard;Sit to/from stand         General ADL Comments: Limited by dizziness/low BP with activity (noted orthostatics with PT earlier today) though physically able to complete tasks with limited assist. DIscussed fall prevention and energy conservation strategies to implement during ADLs/IADLs     Vision Patient Visual Report: No change from baseline Vision Assessment?: No apparent visual deficits     Perception     Praxis      Pertinent Vitals/Pain Pain Assessment:  Faces Pain Score: 8  Faces Pain Scale: Hurts even more Pain Location: headache Pain Descriptors / Indicators: Constant;Headache Pain Intervention(s): Monitored during session;Patient requesting pain meds-RN  notified     Hand Dominance Right   Extremity/Trunk Assessment Upper Extremity Assessment Upper Extremity Assessment: Generalized weakness   Lower Extremity Assessment Lower Extremity Assessment: Defer to PT evaluation   Cervical / Trunk Assessment Cervical / Trunk Assessment: Normal   Communication Communication Communication: No difficulties   Cognition Arousal/Alertness: Awake/alert Behavior During Therapy: WFL for tasks assessed/performed Overall Cognitive Status: No family/caregiver present to determine baseline cognitive functioning                                 General Comments: Cognitively WFL, some decreased awareness of deficits that may impact safety but receptive to safety education   General Comments       Exercises     Shoulder Instructions      Home Living Family/patient expects to be discharged to:: Private residence Living Arrangements: Alone Available Help at Discharge: Family;Available 24 hours/day Type of Home: Mobile home Home Access: Ramped entrance     Home Layout: One level     Bathroom Shower/Tub: Occupational psychologist: Standard     Home Equipment: Walker - 4 wheels;Grab bars - tub/shower;Bedside commode   Additional Comments: uses BSC as shower chair. Daughter has been staying with pt with no immediate plans to leave  Lives With: Daughter    Prior Functioning/Environment Level of Independence: Needs assistance  Gait / Transfers Assistance Needed: ambulatory with rollator, reports 2 falls since Jan 2022 ADL's / Homemaking Assistance Needed: Reports able to complete ADLs, daughter assist with IADLs and transportation   Comments: Just finished with [facility] therapy 2-3 weeks ago        OT Problem List: Decreased strength;Decreased activity tolerance;Impaired balance (sitting and/or standing);Decreased knowledge of use of DME or AE;Pain      OT Treatment/Interventions: Self-care/ADL training;Therapeutic  exercise;DME and/or AE instruction;Energy conservation;Therapeutic activities;Patient/family education;Balance training    OT Goals(Current goals can be found in the care plan section) Acute Rehab OT Goals Patient Stated Goal: go home OT Goal Formulation: With patient Time For Goal Achievement: 09/11/20 Potential to Achieve Goals: Good ADL Goals Pt Will Perform Grooming: with modified independence;standing Pt Will Perform Lower Body Bathing: with modified independence;sit to/from stand Pt Will Transfer to Toilet: with modified independence;ambulating Pt Will Perform Tub/Shower Transfer: Tub transfer;with set-up;ambulating;3 in 1;rolling walker Pt/caregiver will Perform Home Exercise Program: Increased strength;Both right and left upper extremity;With theraband;Independently;With written HEP provided  OT Frequency: Min 2X/week   Barriers to D/C:            Co-evaluation              AM-PAC OT "6 Clicks" Daily Activity     Outcome Measure Help from another person eating meals?: None Help from another person taking care of personal grooming?: A Little Help from another person toileting, which includes using toliet, bedpan, or urinal?: A Little Help from another person bathing (including washing, rinsing, drying)?: A Little Help from another person to put on and taking off regular upper body clothing?: A Little Help from another person to put on and taking off regular lower body clothing?: A Little 6 Click Score: 19   End of Session Equipment Utilized During Treatment: Rolling walker Nurse Communication: Mobility status;Patient requests pain meds;Other (comment) (Blood around IV)  Activity Tolerance: Patient tolerated treatment well;Patient limited by pain Patient left: in bed;with call bell/phone within reach;with bed alarm set  OT Visit Diagnosis: Unsteadiness on feet (R26.81);Other abnormalities of gait and mobility (R26.89);Muscle weakness (generalized) (M62.81);Pain Pain  - part of body:  (head)                Time: 1333-1400 OT Time Calculation (min): 27 min Charges:  OT General Charges $OT Visit: 1 Visit OT Evaluation $OT Eval Moderate Complexity: 1 Mod OT Treatments $Self Care/Home Management : 8-22 mins  Malachy Chamber, OTR/L Acute Rehab Services Office: [phone]   Layla Maw 08/28/2020, 2:17 PM

## 2020-08-28 NOTE — Progress Notes (Signed)
PROGRESS NOTE    Nicole [name]  ZOX:[id number] DOB: 1945-06-25 DOA: 08/27/2020 PCP: [name], MD     Brief Narrative:  Nicole [name] is a 75 y.o. female with medical history significant of CAD s/p stent; fibromyalgia; DM; HLD; and depression/anxiety presenting with generalized weakness.   She had COVID in April 2021 and is a long-hauler with weight loss, appetite loss, progressive decline.  She has dizzy spells, doesn't see real well, not driving.  Last night, she was going to the bathroom and got her walker tangled up.  She got very dizzy and fell into the bathtub.  No LOC.  She injured her L hip/knee and R side.  She continued to feel very dizzy.  She doesn't eat or drink well - she tries and is able to drink more than she eats.  She thinks she has lost 30+ pounds since March.  No urinary symptoms.  No respiratory symptoms. She was admitted with acute kidney injury, failure to thrive.  New events last 24 hours / Subjective: She states that she drinks about 3 Cokes, eats oatmeal, does not think she eats much throughout the day at all, has no appetite and decreased taste since getting COVID infection last April.  She had worked with physical therapy prior to my evaluation, complains of generalized weakness, impaired balance, dizziness, had positive orthostasis with blood pressure 107/55-->74/62 on standing.  Assessment & Plan:   Principal Problem:   AKI (acute kidney injury) ([place]) Active Problems:   Diabetes mellitus type 2 in obese ([place])   Mixed diabetic hyperlipidemia associated with type 2 diabetes mellitus ([place])   Chronic combined systolic and diastolic congestive heart failure (H[facility])   Failure to thrive in adult   AKI -Likely prerenal etiology -Baseline creatinine is 1.1 -Renal ultrasound unremarkable -Improved with IV fluid -Continue IV fluid and monitor BMP, encourage p.o.  Failure to thrive -Unintentional weight loss and progressive debility since COVID in April  2021 -PT OT recommending home health therapy -Appreciate palliative care medicine -Confirmed DNR status with patient today with RN at bedside  Chronic combined CHF -Hold Lasix, Entresto, Aldactone in setting of AKI  CAD -Continue aspirin, Brilinta  Diabetes mellitus -Hemoglobin A1c pending -Continue sliding scale insulin  Hypertension -Hold Toprol in setting of orthostatic hypotension and bradycardia  Hyperlipidemia -Continue Lipitor  Depression/anxiety -Continue Cymbalta, Ativan  Chronic pain syndrome -Continue home Percocet  Hypokalemia -Replace, trend  Hypomagnesemia -Replace, trend  Leukocytosis -Resolved, likely in setting of hemoconcentration -No signs or symptoms of infection -Lactic acid 0.8 -UA negative -Blood cultures negative to date -Monitor off antibiotics   DVT prophylaxis:  enoxaparin (LOVENOX) injection 30 mg Start: 08/27/20 1000  Code Status:     Code Status Orders  (From admission, onward)           Start     Ordered   08/27/20 1656  Do not attempt resuscitation (DNR)  Continuous       Question Answer Comment  In the event of cardiac or respiratory ARREST Do not call a "code blue"   In the event of cardiac or respiratory ARREST Do not perform Intubation, CPR, defibrillation or ACLS   In the event of cardiac or respiratory ARREST Use medication by any route, position, wound care, and other measures to relive pain and suffering. May use oxygen, suction and manual treatment of airway obstruction as needed for comfort.      08/27/20 1655           Code  Status History     Date Active Date Inactive Code Status Order ID Comments User Context   08/27/2020 0941 08/27/2020 1655 Full Code [id number]  Karmen Bongo, MD ED   01/10/2020 2139 01/15/2020 1908 Full Code [id number]  Rolla Plate, DO Inpatient   12/12/2019 0201 12/19/2019 0039 Full Code [id number]  Reubin Milan, MD ED   10/16/2019 2329 10/22/2019 1755 Full Code [id number]   Neena Rhymes, MD ED   06/24/2019 2046 06/28/2019 2121 Full Code [id number]  Shalhoub, Sherryll Burger, MD ED      Family Communication: No family at bedside Disposition Plan:  Status is: Inpatient  Remains inpatient appropriate because:Hemodynamically unstable, Persistent severe electrolyte disturbances, and Inpatient level of care appropriate due to severity of illness  Dispo: The patient is from: Home              Anticipated d/c is to: Home              Patient currently is not medically stable to d/c.   Difficult to place patient No      Consultants:  Palliative care medicine  Procedures:  None  Antimicrobials:  Anti-infectives (From admission, onward)    Start     Dose/Rate Route Frequency Ordered Stop   08/28/20 0600  ceFEPIme (MAXIPIME) 2 g in sodium chloride 0.9 % 100 mL IVPB  Status:  Discontinued        2 g 200 mL/hr over 30 Minutes Intravenous Every 24 hours 08/27/20 0612 08/27/20 0941   08/27/20 0445  ceFEPIme (MAXIPIME) 2 g in sodium chloride 0.9 % 100 mL IVPB        2 g 200 mL/hr over 30 Minutes Intravenous  Once 08/27/20 0438 08/27/20 0621   08/27/20 0445  metroNIDAZOLE (FLAGYL) IVPB 500 mg        500 mg 100 mL/hr over 60 Minutes Intravenous  Once 08/27/20 0438 08/27/20 0648   08/27/20 0445  vancomycin (VANCOREADY) IVPB 1500 mg/300 mL        1,500 mg 150 mL/hr over 120 Minutes Intravenous  Once 08/27/20 0438 08/27/20 1001        Objective: Vitals:   08/28/20 0000 08/28/20 0447 08/28/20 0755 08/28/20 0807  BP:  (!) 93/54 (!) 100/55   Pulse:  88 82   Resp:  14    Temp:  98.1 F (36.7 C) 98 F (36.7 C)   TempSrc:  Oral Oral   SpO2:  99%  99%  Weight: 72.5 kg     Height: 5\' 2"  (1.575 m)      No intake or output data in the 24 hours ending 08/28/20 1032 Filed Weights   08/27/20 0323 08/28/20 0000  Weight: 69.5 kg 72.5 kg    Examination:  General exam: Appears calm and comfortable  Respiratory system: Clear to auscultation. Respiratory effort  normal. No respiratory distress. No conversational dyspnea.  Cardiovascular system: S1 & S2 heard, RRR. No murmurs. No pedal edema. Gastrointestinal system: Abdomen is nondistended, soft and nontender. Normal bowel sounds heard. Central nervous system: Alert and oriented. No focal neurological deficits. Speech clear.  Extremities: Symmetric in appearance  Skin: No rashes, lesions or ulcers on exposed skin  Psychiatry: Judgement and insight appear normal. Mood & affect appropriate.   Data Reviewed: I have personally reviewed following labs and imaging studies  CBC: Recent Labs  Lab 08/27/20 0319 08/27/20 0348 08/28/20 0421  WBC 19.1*  --  6.8  NEUTROABS 14.8*  --   --  HGB 13.4 14.3 10.4*  HCT 39.6 42.0 29.5*  MCV 92.1  --  91.0  PLT 327  --  353   Basic Metabolic Panel: Recent Labs  Lab 08/27/20 0319 08/27/20 0348 08/28/20 0421  NA 134* 136 140  K 2.9* 2.9* 3.1*  CL 99 103 116*  CO2 16*  --  16*  GLUCOSE 162* 159* 93  BUN 106* 121* 72*  CREATININE 4.96* 5.30* 2.45*  CALCIUM 8.2*  --  7.1*  MG  --   --  0.5*   GFR: Estimated Creatinine Clearance: 19.4 mL/min (A) (by C-G formula based on SCr of 2.45 mg/dL (H)). Liver Function Tests: Recent Labs  Lab 08/27/20 0319  AST 19  ALT 24  ALKPHOS 90  BILITOT 1.3*  PROT 8.1  ALBUMIN 4.3   No results for input(s): LIPASE, AMYLASE in the last 168 hours. No results for input(s): AMMONIA in the last 168 hours. Coagulation Profile: Recent Labs  Lab 08/27/20 0319  INR 1.1   Cardiac Enzymes: No results for input(s): CKTOTAL, CKMB, CKMBINDEX, TROPONINI in the last 168 hours. BNP (last 3 results) No results for input(s): PROBNP in the last 8760 hours. HbA1C: No results for input(s): HGBA1C in the last 72 hours. CBG: Recent Labs  Lab 08/27/20 0319  GLUCAP 108*   Lipid Profile: No results for input(s): CHOL, HDL, LDLCALC, TRIG, CHOLHDL, LDLDIRECT in the last 72 hours. Thyroid Function Tests: No results for  input(s): TSH, T4TOTAL, FREET4, T3FREE, THYROIDAB in the last 72 hours. Anemia Panel: No results for input(s): VITAMINB12, FOLATE, FERRITIN, TIBC, IRON, RETICCTPCT in the last 72 hours. Sepsis Labs: Recent Labs  Lab 08/27/20 0630  LATICACIDVEN 0.8    Recent Results (from the past 240 hour(s))  Culture, blood (single)     Status: None (Preliminary result)   Collection Time: 08/27/20  5:30 AM   Specimen: BLOOD RIGHT WRIST  Result Value Ref Range Status   Specimen Description BLOOD RIGHT WRIST  Final   Special Requests   Final    BOTTLES DRAWN AEROBIC AND ANAEROBIC Blood Culture adequate volume   Culture   Final    NO GROWTH 1 DAY Performed at [facility], [address], [place], [place] [address]    Report Status PENDING  Incomplete  Blood culture (routine x 2)     Status: None (Preliminary result)   Collection Time: 08/27/20  5:30 AM   Specimen: BLOOD  Result Value Ref Range Status   Specimen Description BLOOD RIGHT ANTECUBITAL  Final   Special Requests   Final    BOTTLES DRAWN AEROBIC AND ANAEROBIC Blood Culture results may not be optimal due to an inadequate volume of blood received in culture bottles   Culture   Final    NO GROWTH 1 DAY Performed at [facility], [place] [address] [place], [place] [address]    Report Status PENDING  Incomplete  Resp Panel by RT-PCR (Flu A&B, Covid) Nasopharyngeal Swab     Status: None   Collection Time: 08/27/20  5:50 AM   Specimen: Nasopharyngeal Swab; Nasopharyngeal(NP) swabs in vial transport medium  Result Value Ref Range Status   SARS Coronavirus 2 by RT PCR NEGATIVE NEGATIVE Final    Comment: (NOTE) SARS-CoV-2 target nucleic acids are NOT DETECTED.  The SARS-CoV-2 RNA is generally detectable in upper respiratory specimens during the acute phase of infection. The lowest concentration of SARS-CoV-2 viral copies this assay can detect is 138 copies/mL. A negative result does not  preclude SARS-Cov-2 infection and should  not be used as the sole basis for treatment or other patient management decisions. A negative result may occur with  improper specimen collection/handling, submission of specimen other than nasopharyngeal swab, presence of viral mutation(s) within the areas targeted by this assay, and inadequate number of viral copies(<138 copies/mL). A negative result must be combined with clinical observations, patient history, and epidemiological information. The expected result is Negative.  Fact Sheet for Patients:  EntrepreneurPulse.com.au  Fact Sheet for Healthcare Providers:  IncredibleEmployment.be  This test is no t yet approved or cleared by the [place] FDA and  has been authorized for detection and/or diagnosis of SARS-CoV-2 by FDA under an Emergency Use Authorization (EUA). This EUA will remain  in effect (meaning this test can be used) for the duration of the COVID-19 declaration under Section 564(b)(1) of the Act, 21 U.S.C.section 360bbb-3(b)(1), unless the authorization is terminated  or revoked sooner.       Influenza A by PCR NEGATIVE NEGATIVE Final   Influenza B by PCR NEGATIVE NEGATIVE Final    Comment: (NOTE) The Xpert Xpress SARS-CoV-2/FLU/RSV plus assay is intended as an aid in the diagnosis of influenza from Nasopharyngeal swab specimens and should not be used as a sole basis for treatment. Nasal washings and aspirates are unacceptable for Xpert Xpress SARS-CoV-2/FLU/RSV testing.  Fact Sheet for Patients: EntrepreneurPulse.com.au  Fact Sheet for Healthcare Providers: IncredibleEmployment.be  This test is not yet approved or cleared by the [place] FDA and has been authorized for detection and/or diagnosis of SARS-CoV-2 by FDA under an Emergency Use Authorization (EUA). This EUA will remain in effect (meaning this test can be used) for the duration of the COVID-19 declaration under  Section 564(b)(1) of the Act, 21 U.S.C. section 360bbb-3(b)(1), unless the authorization is terminated or revoked.  Performed at [facility], [place] [address] [place], [place] [address]   MRSA Next Gen by PCR, Nasal     Status: None   Collection Time: 08/28/20  5:36 AM   Specimen: Nasal Mucosa; Nasal Swab  Result Value Ref Range Status   MRSA by PCR Next Gen NOT DETECTED NOT DETECTED Final    Comment: (NOTE) The GeneXpert MRSA Assay (FDA approved for NASAL specimens only), is one component of a comprehensive MRSA colonization surveillance program. It is not intended to diagnose MRSA infection nor to guide or monitor treatment for MRSA infections. Test performance is not FDA approved in patients less than 25 years old. Performed at [facility], [place] [address], [place],  [address]       Radiology Studies: DG Chest 2 View  Result Date: 08/27/2020 CLINICAL DATA:  75 year old female with shortness of breath. Fall, struck back of head. Progressive weakness for 2 months. EXAM: CHEST - 2 VIEW COMPARISON:  Portable chest 01/10/2020 and earlier. FINDINGS: Upright AP and lateral views of the chest. Small to moderate hiatal hernia is chronic. Other mediastinal contours are within normal limits. Visualized tracheal air column is within normal limits. Relatively normal lung volumes. Both lungs appear clear. No pneumothorax or pleural effusion. No acute osseous abnormality identified. Stable cholecystectomy clips. Negative visible bowel gas pattern. IMPRESSION: 1. No acute cardiopulmonary abnormality. 2. Chronic hiatal hernia. Electronically Signed   By: Genevie Ann M.D.   On: 08/27/2020 04:23   CT Head Wo Contrast  Result Date: 08/27/2020 CLINICAL DATA:  75 year old female with shortness of breath. Fall, struck back of head. Progressive weakness for 2 months. EXAM: CT  HEAD WITHOUT CONTRAST TECHNIQUE: Contiguous axial images were obtained from the base of the skull through the vertex  without intravenous contrast. COMPARISON:  Brain MRI, head CT 01/10/2020, and earlier. FINDINGS: Brain: Stable cerebral volume. No midline shift, ventriculomegaly, mass effect, evidence of mass lesion, intracranial hemorrhage or evidence of cortically based acute infarction. Gray-white matter differentiation is stable and largely normal for age, with only subtle subcortical white matter changes. Vascular: Calcified atherosclerosis at the skull base. No suspicious intracranial vascular hyperdensity. Skull: Stable, intact.  Mild hyperostosis, normal variant. Sinuses/Orbits: Visualized paranasal sinuses and mastoids are stable and well aerated. Tympanic cavities are clear. Other: On series 4, image 49 left lateral convexity scalp hematoma. Underlying calvarium intact. No scalp soft tissue gas. No orbits soft tissue injury identified. IMPRESSION: 1. Left lateral convexity scalp hematoma without underlying skull fracture. 2. Stable and normal for age non contrast CT appearance of the brain. Electronically Signed   By: Genevie Ann M.D.   On: 08/27/2020 04:26   US RENAL  Result Date: 08/27/2020 CLINICAL DATA:  75 year old female with acute renal insufficiency. Sepsis. EXAM: RENAL / URINARY TRACT ULTRASOUND COMPLETE COMPARISON:  CT Abdomen and Pelvis 01/10/2020. FINDINGS: Right Kidney: Renal measurements: 11.0 x 4.6 x 5.5 cm = volume: 144 mL. Echogenicity within normal limits. No mass or hydronephrosis visualized. Left Kidney: Renal measurements: 12.1 x 4.7 x 4.2 cm = volume: 123 mL. Stable simple appearing left renal midpole cyst, 2.9 cm (image 38). Normal cortical echogenicity and cortical thickness. No hydronephrosis or left renal mass. Bladder: Not visualized, presumably decompressed. Other: None. IMPRESSION: Normal ultrasound appearance of both kidneys. Electronically Signed   By: Genevie Ann M.D.   On: 08/27/2020 11:15      Scheduled Meds:  aspirin EC  81 mg Oral Daily   atorvastatin  80 mg Oral QHS   docusate  sodium  100 mg Oral BID   DULoxetine  60 mg Oral Daily   enoxaparin (LOVENOX) injection  30 mg Subcutaneous Q24H   loratadine  10 mg Oral Daily   LORazepam  1 mg Oral BID   mometasone-formoterol  2 puff Inhalation BID   pantoprazole  20 mg Oral BID   potassium chloride  40 mEq Oral Q4H   sodium chloride flush  3 mL Intravenous Q12H   ticagrelor  90 mg Oral BID   Continuous Infusions:  lactated ringers 100 mL/hr at 08/27/20 2343     LOS: 1 day      Time spent: 35 minutes   Dessa Phi, DO [facility] 08/28/2020, 10:32 AM   Available via Epic secure chat 7am-7pm After these hours, please refer to coverage provider listed on [email or web address]

## 2020-08-28 NOTE — TOC Progression Note (Signed)
Transition of Care [facility]) - Progression Note    Patient Details  Name: Nicole [name] MRN: [id number] Date of Birth: 10/04/1945  Transition of Care [facility]) CM/SW Contact  Zenon Mayo, RN Phone Number: 08/28/2020, 5:05 PM  Clinical Narrative:    NCM spoke with patient at bedside offered choice for HHPT and outpatient palliative services.  She would like to look at the list , NCM will check back with her tomorrow.        Expected Discharge Plan and Services                                                 Social Determinants of Health (SDOH) Interventions    Readmission Risk Interventions Readmission Risk Prevention Plan 10/22/2019  Transportation Screening (No Data)  PCP or Specialist Appt within 3-5 Days Complete  HRI or Home Care Consult Complete  Social Work Consult for [place] Planning/Counseling Complete  Palliative Care Screening Not Applicable  Medication Review Press photographer) Complete  Some recent data might be hidden

## 2020-08-28 NOTE — Evaluation (Signed)
Speech Language Pathology Evaluation Patient Details Name: Nicole [name] MRN: [id number] DOB: 01-23-1946 Today's Date: 08/28/2020 Time: 0920-0950 SLP Time Calculation (min) (ACUTE ONLY): 30 min  Problem List:  Patient Active Problem List   Diagnosis Date Noted   AKI (acute kidney injury) ([place]) 08/27/2020   Failure to thrive in adult 08/27/2020   Sepsis secondary to UTI ([place]) 01/11/2020   Sepsis ([place]) 01/10/2020   Non-ST elevation (NSTEMI) myocardial infarction [facility])    Acute respiratory failure with hypoxia ([place])    Benzodiazepine withdrawal with complication ([place])    Severe sepsis ([place]) 12/12/2019   Hyperammonemia ([place]) 12/12/2019   Chest pain in adult 12/12/2019   Nystagmus 12/11/2019   Leukocytosis 12/11/2019   Chronic combined systolic and diastolic congestive heart failure ([place]) 12/11/2019   Type 2 diabetes mellitus ([place]) 12/11/2019   Acute combined systolic and diastolic congestive heart failure ([place]) 10/22/2019   CAD S/P percutaneous coronary angioplasty 10/22/2019   Aphasia 10/21/2019   Elevated troponin    Cardiomyopathy (HCC)    DOE (dyspnea on exertion) 10/16/2019   Mild intermittent asthma with (acute) exacerbation 06/24/2019   COVID-19 virus infection 91/79/1505   Acute metabolic encephalopathy 69/79/4801   Hypokalemia, inadequate intake 06/24/2019   Hypomagnesemia 06/24/2019   GERD with esophagitis    Major depressive disorder    Mixed diabetic hyperlipidemia associated with type 2 diabetes mellitus ([place])    Chronic daily headache 12/07/2014   Memory loss 08/31/2014   Worsening headaches 08/31/2014   Atypical chest pain 11/05/2013   Anemia    Anxiety    Depression    Diabetes mellitus type 2 in obese [facility])    Urinary incontinence    Endometriosis    Asthma    Fibromyalgia    Past Medical History:  Past Medical History:  Diagnosis Date   Anemia    Anxiety    Back pain    CAD (coronary artery disease) 10/22/2019   DES to [facility] August 2021   Colon  polyps    COVID-28 June 2019   Endometriosis    Fibromyalgia    GERD with esophagitis    Follows with Dr. 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 mellitus ([place])    Type 2 diabetes mellitus ([place])    Urinary incontinence    Past Surgical History:  Past Surgical History:  Procedure Laterality Date   ABDOMINAL HYSTERECTOMY     TAH BSO   APPENDECTOMY  1999   CATARACT EXTRACTION     both   CHOLECYSTECTOMY     CORONARY STENT INTERVENTION N/A 10/21/2019   Procedure: CORONARY STENT INTERVENTION;  Surgeon: Troy Sine, MD;  Location: [facility];  Service: Cardiovascular;  Laterality: N/A;   DENTAL SURGERY     HERNIA REPAIR  2000   LAPAROSCOPIC ENDOMETRIOSIS FULGURATION     RIGHT/LEFT HEART CATH AND CORONARY ANGIOGRAPHY N/A 10/21/2019   Procedure: RIGHT/LEFT HEART CATH AND CORONARY ANGIOGRAPHY;  Surgeon: Troy Sine, MD;  Location: [facility];  Service: Cardiovascular;  Laterality: N/A;   HPI:  75 year old female with medical history for type 2 diabetes mellitus, dyslipidemia, heart failure, coronary disease, GERD, irritable bowel syndrome, chronic vertigo and fibromyalgia presenting with lightheadedness and a fall in the bathroom.  Patient also complains of a  several month history of progressively worsening weakness and poor oral intake.  Upon evaluation in the ED, patient found to have a creatinine of 5.5.  Patient also found to have a significant leukocytosis of 19.  Chest x-ray clear; Pt had COVID-19 in March and has lost 30+ lbs and had decreased appetite and various issues since this dx.  SLE ordered.   Assessment / Plan / Recommendation Clinical Impression  Pt administered the SLUMS ([place] Mental Status Examination) with a score obtained of 20/30 with 27/30 being considered within normal  range.  Her speech was 100% intelligible within complex conversation.  She was oriented x4, able to complete simple calculation task, repeat a series of numbers backwards without difficulty and complete a visuospatial clock formation task.  She experienced difficulty during an auditory comprehension task related to paragraph retention with 25% accuracy obtained, recall of objects with a time delay gained 40% accuracy without a category/choices.  She was able to recall all objects with category cues.  Pt stated her overall  processing of information has been "slower" since contracting Covid in March.  She also c/o dizziness/vertigo, recent falls and decreased appetite/PO intake with a weight loss of 30+ lbs since March.  ST will f/u while in acute setting for cognitive deficits/education re: compensatory strategies for memory/organization.  Thank you for this consult.    SLP Assessment  SLP Recommendation/Assessment: Patient needs continued Speech Lanaguage Pathology Services SLP Visit Diagnosis: Cognitive communication deficit (R41.841)    Follow Up Recommendations  Other (comment) (TBD)    Frequency and Duration min 2x/week  1 week      SLP Evaluation Cognition  Overall Cognitive Status: No family/caregiver present to determine baseline cognitive functioning Arousal/Alertness: Awake/alert Orientation Level: Oriented X4 Attention: Sustained Sustained Attention: Appears intact Memory: Impaired Memory Impairment: Decreased short term memory;Decreased recall of new information;Retrieval deficit Decreased Short Term Memory: Verbal basic;Functional basic Immediate Memory Recall: Sock;Blue;Bed Memory Recall Sock: Without Cue Memory Recall Blue: Without Cue Memory Recall Bed: With Cue Awareness: Appears intact Problem Solving: Appears intact Executive Function: Organizing Organizing: Impaired Organizing Impairment: Verbal basic;Functional basic Safety/Judgment: Appears intact Comments: Pt  aware she is unable to drive d/t vertigo/dizziness experienced since became sick in March; has family (daughter) living with her now assisting with various ADLs       Comprehension  Auditory Comprehension Overall Auditory Comprehension: Impaired Yes/No Questions: Within Functional Limits Commands: Impaired Multistep Basic Commands: 25-49% accurate Conversation: Complex Interfering Components: Working memory;Processing speed EffectiveTechniques: Extra processing time;Repetition Environmental consultant Discrimination: Not tested Reading Comprehension Reading Status: Not tested    Expression Expression Primary Mode of Expression: Verbal Verbal Expression Overall Verbal Expression: Appears within functional limits for tasks assessed Initiation: No impairment Level of Generative/Spontaneous Verbalization: Conversation Repetition: No impairment Naming: Impairment Responsive: 76-100% accurate Confrontation: Within functional limits Convergent: 25-49% accurate Divergent: 25-49% accurate Non-Verbal Means of Communication: Not applicable Written Expression Dominant Hand: Right Written Expression: Within Functional Limits   Oral / Motor  Oral Motor/Sensory Function Overall Oral Motor/Sensory Function: Within functional limits Motor Speech Overall Motor Speech: Appears within functional limits for tasks assessed Respiration: Within functional limits Phonation: Normal Resonance: Within functional limits Articulation: Within functional limitis Intelligibility: Intelligible Motor Planning: Witnin functional limits Motor Speech Errors: Not applicable                       Elvina Sidle, M.S., CCC-SLP 08/28/2020, 11:53 AM

## 2020-08-28 NOTE — Progress Notes (Addendum)
Palliative Medicine Inpatient Follow Up Note  Reason for consult:  Goals of Care   HPI:  Per intake H&P --> Nicole [name] is a 75 y.o. female with medical history significant of CAD s/p stent; fibromyalgia; DM; HLD; and depression/anxiety presenting with generalized weakness.   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  She has dizzy spells, doesn't see real well, not driving.  Last night, she was going to the bathroom and got her walker tangled up.  She got very dizzy and fell into the bathtub.  No LOC.  She injured her L hip/knee and R side.  She continued to feel very dizzy.  She doesn't eat or drink well - she tries and is able to drink more than she eats.  She thinks she has lost 30+ pounds since March.     Palliative care has been asked to consult on Nicole [name] in the setting of weight loss and chronic co-morbidities to further address goals of care.  Today's Discussion (08/28/2020):  *Please note that this is a verbal dictation therefore any spelling or grammatical errors are due to the "Dragon Medical One" system interpretation.  Chart reviewed. Cr improving. Mg being repleted.  I met with Nicole [name] this morning at bedside. She more spirited today. We reviewed her present clinical conditions inclusive of her AKI and FTT. Discussed the improvements with present measures. Discussed her orthostasis with PT and potential causes for this. Encouraged patient to continue to intake as much as she can and mobilize as able.    Goals remains to optimize strength and "get better"  Questions and concerns addressed  _____________________________________________________________________ Addendum:  I was able to speak to patients daughter, Nicole [name]. We reviewed Nicole [name]'s reason for hospitalization. Discussed her AKI and depletion of her electrolytes. Reviewed that lab values are improving.  Daughter asked if we could pursue a TTE in house as she had been scheduled for this as an outpatient. Dr. Alvino Chapel was  messaged and shared she would be able to order this. Discussed patients weight    Reviewed patients weight loss which daughter does not agree with as she feels she had gained "a lot of the weight back." Asked for information from nutrition which has been passed along to the dietician.   Questions and concerns answered.  Objective Assessment: Vital Signs Vitals:   08/28/20 0807 08/28/20 1209  BP:  (!) 95/48  Pulse:  75  Resp:    Temp:    SpO2: 99%    No intake or output data in the 24 hours ending 08/28/20 1227 Last Weight  Most recent update: 08/28/2020 12:00 AM    Weight  72.5 kg (159 lb 13.3 oz)            Gen:  Elderly F in NAD HEENT: moist mucous membranes CV: Regular rate and rhythm PULM: On [place] ABD: soft/nontender EXT: No edema Neuro: Alert and oriented x3  SUMMARY OF RECOMMENDATIONS   DNAR/DNI   MOST Completed, paper copy placed onto the chart electric copy can be found in [facility]   DNR Form Completed, paper copy placed onto the chart electric copy can be found in [facility]   TOC - OP Palliative Support   Ongoing conversations during hospitalization   Symptom Management:  Failure to Thrive:                 - Dietician involved                 - Encourage  1:1 feeding                 - Supplemental nutrients   Muscular Weakness:                 - Physical Therapy Evaluation                 - Occupational Therapy Evaluation  Insomnia:  - Trazodone $RemoveBef'50mg'XyTcvSbTqM$  PO QHS    Time Spent: 45 Greater than 50% of the time was spent in counseling and coordination of care ______________________________________________________________________________________ [place] Team Team Cell Phone: [phone] Please utilize secure chat with additional questions, if there is no response within 30 minutes please call the above phone number  Palliative Medicine Team providers are available by phone from 7am to 7pm daily and can be reached through  the team cell phone.  Should this patient require assistance outside of these hours, please call the patient's attending physician.

## 2020-08-28 NOTE — Progress Notes (Addendum)
Initial Nutrition Assessment  DOCUMENTATION CODES:   Not applicable  INTERVENTION:   -MVI with minerals daily -Ensure Enlive po TID, each supplement provides 350 kcal and 20 grams of protein  -Hormel Shake TID, each supplement provides 520 kcals and 22 grams protein -Magic cup TID with meals, each supplement provides 290 kcal and 9 grams of protein   NUTRITION DIAGNOSIS:   Inadequate oral intake related to poor appetite as evidenced by per patient/family report.  GOAL:   Patient will meet greater than or equal to 90% of their needs  MONITOR:   PO intake, Supplement acceptance, Labs, Weight trends, Skin, I & O's  REASON FOR ASSESSMENT:   Consult Assessment of nutrition requirement/status, Poor PO  ASSESSMENT:   Nicole [name] is a 75 y.o. female with medical history significant of CAD s/p stent; fibromyalgia; DM; HLD; and depression/anxiety presenting with generalized weakness.   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  She has dizzy spells, doesn't see real well, not driving.  Last night, she was going to the bathroom and got her walker tangled up.  She got very dizzy and fell into the bathtub.  No LOC.  She injured her L hip/knee and R side.  She continued to feel very dizzy.  She doesn't eat or drink well - she tries and is able to drink more than she eats.  She thinks she has lost 30+ pounds since March.  No urinary symptoms.  No respiratory symptoms.  Pt admitted with AKI and FTT.   Reviewed I/O's: +1.5 L x 24 hours and +3.1 L since admission  Spoke with pt at bedside, who reports feeling stiff due to not sleeping well last night. She reports that she has been experiencing a decreased appetite since her COVID-19 diagnosis in March 2021; she complains of taste changes and minimized sense of smell. The only foods that are appealing to her are fresh fruit (observed breakfast tray- pt consumed 100% of fruit juice and two fruit cups).   PTA, she has been consuming 1 meal  per day, which her daughter cooks for her (usually spaghetti). She shares that she often has difficulty affording food and has applied for food stamps for additional assistance. She also complains of weakness, needing to get around with a wheelchair.    Per pt, UBW is around 180#. She estimates that she has gradually lost about 60 pounds over the past 15 months. Reviewed wt hx; wt has been stable over the past 4 months.   Discussed importance of good meal and supplement intake to promote healing. She is amenable to oral nutrition supplements- provided pt with chocolate nutritional shake per her request.   ADDENDUM: Case discussed with palliative care NP, who is requesting RD reach out to daughter. RD called and spoke with daughter Nicole [name]), who reports that pt consumes [employer] brand nutritional shake every morning. RD encouraged continuing this and to increase to twice a day. Reviewed current interventions and plan of care. Also discussed appropriate MVI for home.   Medications reviewed and include colace and ativan.   Labs reviewed: K 3.1.    NUTRITION - FOCUSED PHYSICAL EXAM:  Flowsheet Row Most Recent Value  Orbital Region No depletion  Upper Arm Region No depletion  Thoracic and Lumbar Region No depletion  Buccal Region No depletion  Temple Region No depletion  Clavicle Bone Region No depletion  Clavicle and Acromion Bone Region No depletion  Scapular Bone Region No depletion  Dorsal Hand No depletion  Patellar Region No depletion  Anterior Thigh Region No depletion  Posterior Calf Region No depletion  Edema (RD Assessment) None  Hair Reviewed  Eyes Reviewed  Mouth Reviewed  Skin Reviewed  Nails Reviewed       Diet Order:   Diet Order             Diet regular Room service appropriate? Yes; Fluid consistency: Thin  Diet effective now                   EDUCATION NEEDS:   Education needs have been addressed  Skin:  Skin Assessment: Reviewed RN  Assessment  Last BM:  Unknown  Height:   Ht Readings from Last 1 Encounters:  08/28/20 5\' 2"  (1.575 m)    Weight:   Wt Readings from Last 1 Encounters:  08/28/20 72.5 kg    Ideal Body Weight:  50 kg  BMI:  Body mass index is 29.23 kg/m.  Estimated Nutritional Needs:   Kcal:  1800-2000  Protein:  95-110 grams  Fluid:  > 1.8 L    Loistine Chance, RD, LDN, [place] Registered Dietitian II Certified Diabetes Care and Education Specialist Please refer to [facility] for RD and/or RD on-call/weekend/after hours pager

## 2020-08-29 ENCOUNTER — Inpatient Hospital Stay (HOSPITAL_COMMUNITY): Payer: Medicare Other

## 2020-08-29 DIAGNOSIS — I5042 Chronic combined systolic (congestive) and diastolic (congestive) heart failure: Secondary | ICD-10-CM | POA: Diagnosis not present

## 2020-08-29 LAB — CBC
HCT: 27.6 % — ABNORMAL LOW (ref 36.0–46.0)
Hemoglobin: 9.6 g/dL — ABNORMAL LOW (ref 12.0–15.0)
MCH: 31.7 pg (ref 26.0–34.0)
MCHC: 34.8 g/dL (ref 30.0–36.0)
MCV: 91.1 fL (ref 80.0–100.0)
Platelets: 170 10*3/uL (ref 150–400)
RBC: 3.03 MIL/uL — ABNORMAL LOW (ref 3.87–5.11)
RDW: 13.1 % (ref 11.5–15.5)
WBC: 5.4 10*3/uL (ref 4.0–10.5)
nRBC: 0 % (ref 0.0–0.2)

## 2020-08-29 LAB — BASIC METABOLIC PANEL
Anion gap: 5 (ref 5–15)
BUN: 40 mg/dL — ABNORMAL HIGH (ref 8–23)
CO2: 20 mmol/L — ABNORMAL LOW (ref 22–32)
Calcium: 8 mg/dL — ABNORMAL LOW (ref 8.9–10.3)
Chloride: 117 mmol/L — ABNORMAL HIGH (ref 98–111)
Creatinine, Ser: 1.28 mg/dL — ABNORMAL HIGH (ref 0.44–1.00)
GFR, Estimated: 45 mL/min — ABNORMAL LOW (ref >60)
Glucose, Bld: 184 mg/dL — ABNORMAL HIGH (ref 70–99)
Potassium: 3.7 mmol/L (ref 3.5–5.1)
Sodium: 142 mmol/L (ref 135–145)

## 2020-08-29 LAB — HEMOGLOBIN A1C
Hgb A1c MFr Bld: 7.7 % — ABNORMAL HIGH (ref 4.8–5.6)
Mean Plasma Glucose: 174 mg/dL

## 2020-08-29 LAB — GLUCOSE, CAPILLARY
Glucose-Capillary: 90 mg/dL (ref 70–99)
Glucose-Capillary: 169 mg/dL — ABNORMAL HIGH (ref 70–99)
Glucose-Capillary: 196 mg/dL — ABNORMAL HIGH (ref 70–99)

## 2020-08-29 LAB — ECHOCARDIOGRAM COMPLETE
Area-P 1/2: 4.31 cm2
Calc EF: 65.1 %
Height: 62 in
S' Lateral: 3 cm
Single Plane A2C EF: 65.9 %
Single Plane A4C EF: 62.9 %
Weight: 2574.97 oz

## 2020-08-29 LAB — MAGNESIUM: Magnesium: 1.8 mg/dL (ref 1.7–2.4)

## 2020-08-29 MED ORDER — ENOXAPARIN SODIUM 40 MG/0.4ML IJ SOSY
40.0000 mg | PREFILLED_SYRINGE | INTRAMUSCULAR | Status: DC
  Administered 2020-08-29 – 2020-08-30 (×2): 40 mg via SUBCUTANEOUS
  Filled 2020-08-29 (×2): qty 0.4

## 2020-08-29 MED ORDER — ALUM & MAG HYDROXIDE-SIMETH 200-200-20 MG/5ML PO SUSP
30.0000 mL | Freq: Once | ORAL | Status: AC
  Administered 2020-08-29: 30 mL via ORAL
  Filled 2020-08-29: qty 30

## 2020-08-29 NOTE — TOC Initial Note (Signed)
Transition of Care [facility]) - Initial/Assessment Note    Patient Details  Name: Nicole [name] MRN: [id number] Date of Birth: Jul 24, 1945  Transition of Care [facility]) CM/SW Contact:    Zenon Mayo, RN Phone Number: 08/29/2020, 2:34 PM  Clinical Narrative:                 NCM spoke with patient again today to see what her choices were, she does not want HHPT, she chose [facility] for outpatient palliative services.  NCM made referral to [facility].  Patient states she will have transportation at discharge.  She states she has no issues with getting medications.    Expected Discharge Plan: Home/Self Care Barriers to Discharge: No Barriers Identified   Patient Goals and CMS Choice Patient states their goals for this hospitalization and ongoing recovery are:: return home CMS Medicare.gov Compare Post Acute Care list provided to:: Patient Choice offered to / list presented to : Patient  Expected Discharge Plan and Services Expected Discharge Plan: Home/Self Care   Discharge Planning Services: CM Consult   Living arrangements for the past 2 months: Mobile Home                   DME Agency: NA       HH Arranged: Refused HH          Prior Living Arrangements/Services Living arrangements for the past 2 months: Mobile Home Lives with:: Adult Children Patient language and need for interpreter reviewed:: Yes Do you feel safe going back to the place where you live?: Yes      Need for Family Participation in Patient Care: Yes (Comment) Care giver support system in place?: Yes (comment) Current home services: DME (wheelchair, walker, bathseat, has a ramp) Criminal Activity/Legal Involvement Pertinent to Current Situation/Hospitalization: No - Comment as needed  Activities of Daily Living Home Assistive Devices/Equipment: Walker (specify type) ADL Screening (condition at time of admission) Patient's cognitive ability adequate to safely complete daily activities?: Yes Is the  patient deaf or have difficulty hearing?: No Does the patient have difficulty seeing, even when wearing glasses/contacts?: No Does the patient have difficulty concentrating, remembering, or making decisions?: No Patient able to express need for assistance with ADLs?: Yes Does the patient have difficulty dressing or bathing?: No Independently performs ADLs?: Yes (appropriate for developmental age) Does the patient have difficulty walking or climbing stairs?: Yes Weakness of Legs: Both Weakness of Arms/Hands: None  Permission Sought/Granted                  Emotional Assessment   Attitude/Demeanor/Rapport: Engaged Affect (typically observed): Appropriate Orientation: : Oriented to Self, Oriented to Place, Oriented to  Time, Oriented to Situation Alcohol / Substance Use: Not Applicable Psych Involvement: No (comment)  Admission diagnosis:  Hypokalemia [E87.6] AKI (acute kidney injury) ([place]) [N17.9] Severe sepsis ([place]) [A41.9, R65.20] Patient Active Problem List   Diagnosis Date Noted   AKI (acute kidney injury) ([place]) 08/27/2020   Failure to thrive in adult 08/27/2020   Sepsis secondary to UTI ([place]) 01/11/2020   Sepsis ([place]) 01/10/2020   Non-ST elevation (NSTEMI) myocardial infarction [facility])    Acute respiratory failure with hypoxia ([place])    Benzodiazepine withdrawal with complication ([place])    Severe sepsis ([place]) 12/12/2019   Hyperammonemia ([place]) 12/12/2019   Chest pain in adult 12/12/2019   Nystagmus 12/11/2019   Leukocytosis 12/11/2019   Chronic combined systolic and diastolic congestive heart failure ([place]) 12/11/2019   Type 2 diabetes mellitus ([place]) 12/11/2019  Acute combined systolic and diastolic congestive heart failure ([place]) 10/22/2019   CAD S/P percutaneous coronary angioplasty 10/22/2019   Aphasia 10/21/2019   Elevated troponin    Cardiomyopathy (HCC)    DOE (dyspnea on exertion) 10/16/2019   Mild intermittent asthma with (acute) exacerbation 06/24/2019    COVID-19 virus infection 36/46/8032   Acute metabolic encephalopathy 03/03/8249   Hypokalemia, inadequate intake 06/24/2019   Hypomagnesemia 06/24/2019   GERD with esophagitis    Major depressive disorder    Mixed diabetic hyperlipidemia associated with type 2 diabetes mellitus ([place])    Chronic daily headache 12/07/2014   Memory loss 08/31/2014   Worsening headaches 08/31/2014   Atypical chest pain 11/05/2013   Anemia    Anxiety    Depression    Diabetes mellitus type 2 in obese ([place])    Urinary incontinence    Endometriosis    Asthma    Fibromyalgia    PCP:  [name], MD Pharmacy:   [facility] [place], [address] [place] [address] Phone: [phone] Fax: [phone]     Social Determinants of Health (SDOH) Interventions    Readmission Risk Interventions Readmission Risk Prevention Plan 08/29/2020 10/22/2019  Transportation Screening Complete (No Data)  PCP or Specialist Appt within 3-5 Days - Complete  HRI or [place] - Complete  Social Work Consult for [place] Planning/Counseling - Complete  Palliative Care Screening - Not Applicable  Medication Review Press photographer) Complete Complete  PCP or Specialist appointment within 3-5 days of discharge Complete -  [place] or Home Care Consult Complete -  SW Recovery Care/Counseling Consult Complete -  Palliative Care Screening Complete -  [place] Not Applicable -  Some recent data might be hidden

## 2020-08-29 NOTE — Progress Notes (Signed)
  Echocardiogram 2D Echocardiogram has been performed.  Nicole [name] 08/29/2020, 11:31 AM

## 2020-08-29 NOTE — Progress Notes (Addendum)
PROGRESS NOTE    Nicole [name]  QPR:[id number] DOB: [birth year] DOA: 08/27/2020 PCP: [name], MD     Brief Narrative:  Nicole [name] is a 75 y.o. female with medical history significant of CAD s/p stent; fibromyalgia; DM; HLD; and depression/anxiety presenting with generalized weakness.   She had COVID in April 2021 and is a long-hauler with weight loss, appetite loss, progressive decline.  She has dizzy spells, doesn't see real well, not driving.  Last night, she was going to the bathroom and got her walker tangled up.  She got very dizzy and fell into the bathtub.  No LOC.  She injured her L hip/knee and R side.  She continued to feel very dizzy.  She doesn't eat or drink well - she tries and is able to drink more than she eats.  She thinks she has lost 30+ pounds since March.  No urinary symptoms.  No respiratory symptoms. She was admitted with acute kidney injury, failure to thrive.  New events last 24 hours / Subjective: No new issues.  Orthostatic vital signs improved today 135/70 --> 111/52.  Still having some dizziness, vertigo feeling, unsteady on her feet.  Awaiting to work with physical therapy again.  Does not think that she had dinner yesterday.  Assessment & Plan:   Principal Problem:   AKI (acute kidney injury) ([place]) Active Problems:   Diabetes mellitus type 2 in obese ([place])   Mixed diabetic hyperlipidemia associated with type 2 diabetes mellitus ([place])   Chronic combined systolic and diastolic congestive heart failure (H[facility])   Failure to thrive in adult   AKI -Likely prerenal etiology -Baseline creatinine is 1.1 -Renal ultrasound unremarkable -Improved with IV fluid  Failure to thrive -Unintentional weight loss and progressive debility since COVID in April 2021 -PT OT recommending home health therapy -Appreciate palliative care medicine -Confirmed DNR status with patient   Chronic combined CHF -Hold Lasix, Entresto, Aldactone in setting of  AKI  CAD -Continue aspirin, Brilinta  Diabetes mellitus -Hemoglobin A1c 7.7 -Continue sliding scale insulin  Hypertension -Hold Toprol in setting of orthostatic hypotension and bradycardia  Hyperlipidemia -Continue Lipitor  Depression/anxiety -Continue Cymbalta, Ativan  Chronic pain syndrome -Continue home Percocet  Leukocytosis -Resolved, likely in setting of hemoconcentration -No signs or symptoms of infection -Lactic acid 0.8 -UA negative -Blood cultures negative to date -Monitor off antibiotics   DVT prophylaxis:  enoxaparin (LOVENOX) injection 40 mg Start: 08/29/20 1000  Code Status:     Code Status Orders  (From admission, onward)           Start     Ordered   08/27/20 1656  Do not attempt resuscitation (DNR)  Continuous       Question Answer Comment  In the event of cardiac or respiratory ARREST Do not call a "code blue"   In the event of cardiac or respiratory ARREST Do not perform Intubation, CPR, defibrillation or ACLS   In the event of cardiac or respiratory ARREST Use medication by any route, position, wound care, and other measures to relive pain and suffering. May use oxygen, suction and manual treatment of airway obstruction as needed for comfort.      08/27/20 1655           Code Status History     Date Active Date Inactive Code Status Order ID Comments User Context   08/27/2020 0941 08/27/2020 1655 Full Code [id number]  Karmen Bongo, MD ED   01/10/2020 2139 01/15/2020 1908 Full Code [id number]  Rolla Plate, DO Inpatient   12/12/2019 0201 12/19/2019 0039 Full Code [id number]  Reubin Milan, MD ED   10/16/2019 2329 10/22/2019 1755 Full Code [id number]  Neena Rhymes, MD ED   06/24/2019 2046 06/28/2019 2121 Full Code [id number]  Shalhoub, Sherryll Burger, MD ED      Family Communication: No family at bedside Disposition Plan:  Status is: Inpatient  Remains inpatient appropriate because:Inpatient level of care appropriate due to  severity of illness  Dispo: The patient is from: Home              Anticipated d/c is to: Home              Patient currently is not medically stable to d/c.   Difficult to place patient No      Consultants:  Palliative care medicine  Procedures:  None  Antimicrobials:  Anti-infectives (From admission, onward)    Start     Dose/Rate Route Frequency Ordered Stop   08/28/20 0600  ceFEPIme (MAXIPIME) 2 g in sodium chloride 0.9 % 100 mL IVPB  Status:  Discontinued        2 g 200 mL/hr over 30 Minutes Intravenous Every 24 hours 08/27/20 0612 08/27/20 0941   08/27/20 0445  ceFEPIme (MAXIPIME) 2 g in sodium chloride 0.9 % 100 mL IVPB        2 g 200 mL/hr over 30 Minutes Intravenous  Once 08/27/20 0438 08/27/20 0621   08/27/20 0445  metroNIDAZOLE (FLAGYL) IVPB 500 mg        500 mg 100 mL/hr over 60 Minutes Intravenous  Once 08/27/20 0438 08/27/20 0648   08/27/20 0445  vancomycin (VANCOREADY) IVPB 1500 mg/300 mL        1,500 mg 150 mL/hr over 120 Minutes Intravenous  Once 08/27/20 0438 08/27/20 1001        Objective: Vitals:   08/29/20 0720 08/29/20 0755 08/29/20 1400 08/29/20 1503  BP:  132/65  115/65  Pulse:  99  (!) 106  Resp:  20  18  Temp:  98.3 F (36.8 C)  98.9 F (37.2 C)  TempSrc:  Oral  Oral  SpO2: 94% 94% 95% 91%  Weight:      Height:        Intake/Output Summary (Last 24 hours) at 08/29/2020 1511 Last data filed at 08/29/2020 1318 Gross per 24 hour  Intake 4782.66 ml  Output 1000 ml  Net 3782.66 ml   Filed Weights   08/27/20 0323 08/28/20 0000 08/29/20 0450  Weight: 69.5 kg 72.5 kg 73 kg   Examination: General exam: Appears calm and comfortable  Respiratory system: Clear to auscultation. Respiratory effort normal. Cardiovascular system: S1 & S2 heard, RRR. No pedal edema. Gastrointestinal system: Abdomen is nondistended, soft and nontender. Normal bowel sounds heard. Central nervous system: Alert and oriented. Non focal exam. Speech clear   Extremities: Symmetric in appearance bilaterally  Skin: No rashes, lesions or ulcers on exposed skin  Psychiatry: Judgement and insight appear stable. Mood & affect appropriate.    Data Reviewed: I have personally reviewed following labs and imaging studies  CBC: Recent Labs  Lab 08/27/20 0319 08/27/20 0348 08/28/20 0421 08/29/20 0308  WBC 19.1*  --  6.8 5.4  NEUTROABS 14.8*  --   --   --   HGB 13.4 14.3 10.4* 9.6*  HCT 39.6 42.0 29.5* 27.6*  MCV 92.1  --  91.0 91.1  PLT 327  --  181 782    Basic Metabolic  Panel: Recent Labs  Lab 08/27/20 0319 08/27/20 0348 08/28/20 0421 08/29/20 0308  NA 134* 136 140 142  K 2.9* 2.9* 3.1* 3.7  CL 99 103 116* 117*  CO2 16*  --  16* 20*  GLUCOSE 162* 159* 93 184*  BUN 106* 121* 72* 40*  CREATININE 4.96* 5.30* 2.45* 1.28*  CALCIUM 8.2*  --  7.1* 8.0*  MG  --   --  0.5* 1.8    GFR: Estimated Creatinine Clearance: 37.2 mL/min (A) (by C-G formula based on SCr of 1.28 mg/dL (H)). Liver Function Tests: Recent Labs  Lab 08/27/20 0319  AST 19  ALT 24  ALKPHOS 90  BILITOT 1.3*  PROT 8.1  ALBUMIN 4.3    No results for input(s): LIPASE, AMYLASE in the last 168 hours. No results for input(s): AMMONIA in the last 168 hours. Coagulation Profile: Recent Labs  Lab 08/27/20 0319  INR 1.1    Cardiac Enzymes: No results for input(s): CKTOTAL, CKMB, CKMBINDEX, TROPONINI in the last 168 hours. BNP (last 3 results) No results for input(s): PROBNP in the last 8760 hours. HbA1C: Recent Labs    08/28/20 0421  HGBA1C 7.7*   CBG: Recent Labs  Lab 08/27/20 0319 08/28/20 1634 08/28/20 2116 08/29/20 0635 08/29/20 1504  GLUCAP 108* 106* 89 90 169*    Lipid Profile: No results for input(s): CHOL, HDL, LDLCALC, TRIG, CHOLHDL, LDLDIRECT in the last 72 hours. Thyroid Function Tests: No results for input(s): TSH, T4TOTAL, FREET4, T3FREE, THYROIDAB in the last 72 hours. Anemia Panel: No results for input(s): VITAMINB12, FOLATE,  FERRITIN, TIBC, IRON, RETICCTPCT in the last 72 hours. Sepsis Labs: Recent Labs  Lab 08/27/20 0630  LATICACIDVEN 0.8     Recent Results (from the past 240 hour(s))  Culture, blood (single)     Status: None (Preliminary result)   Collection Time: 08/27/20  5:30 AM   Specimen: BLOOD RIGHT WRIST  Result Value Ref Range Status   Specimen Description BLOOD RIGHT WRIST  Final   Special Requests   Final    BOTTLES DRAWN AEROBIC AND ANAEROBIC Blood Culture adequate volume   Culture   Final    NO GROWTH 2 DAYS Performed at [facility], [address] [place], [place] [address]    Report Status PENDING  Incomplete  Blood culture (routine x 2)     Status: None (Preliminary result)   Collection Time: 08/27/20  5:30 AM   Specimen: BLOOD  Result Value Ref Range Status   Specimen Description BLOOD RIGHT ANTECUBITAL  Final   Special Requests   Final    BOTTLES DRAWN AEROBIC AND ANAEROBIC Blood Culture results may not be optimal due to an inadequate volume of blood received in culture bottles   Culture   Final    NO GROWTH 2 DAYS Performed at [facility], [place] [address], [place],  [address]    Report Status PENDING  Incomplete  Resp Panel by RT-PCR (Flu A&B, Covid) Nasopharyngeal Swab     Status: None   Collection Time: 08/27/20  5:50 AM   Specimen: Nasopharyngeal Swab; Nasopharyngeal(NP) swabs in vial transport medium  Result Value Ref Range Status   SARS Coronavirus 2 by RT PCR NEGATIVE NEGATIVE Final    Comment: (NOTE) SARS-CoV-2 target nucleic acids are NOT DETECTED.  The SARS-CoV-2 RNA is generally detectable in upper respiratory specimens during the acute phase of infection. The lowest concentration of SARS-CoV-2 viral copies this assay can detect is 138 copies/mL. A negative  result does not preclude SARS-Cov-2 infection and should not be used as the sole basis for treatment or other patient management decisions. A negative result may occur with  improper  specimen collection/handling, submission of specimen other than nasopharyngeal swab, presence of viral mutation(s) within the areas targeted by this assay, and inadequate number of viral copies(<138 copies/mL). A negative result must be combined with clinical observations, patient history, and epidemiological information. The expected result is Negative.  Fact Sheet for Patients:  EntrepreneurPulse.com.au  Fact Sheet for Healthcare Providers:  IncredibleEmployment.be  This test is no t yet approved or cleared by the [place] FDA and  has been authorized for detection and/or diagnosis of SARS-CoV-2 by FDA under an Emergency Use Authorization (EUA). This EUA will remain  in effect (meaning this test can be used) for the duration of the COVID-19 declaration under Section 564(b)(1) of the Act, 21 U.S.C.section 360bbb-3(b)(1), unless the authorization is terminated  or revoked sooner.       Influenza A by PCR NEGATIVE NEGATIVE Final   Influenza B by PCR NEGATIVE NEGATIVE Final    Comment: (NOTE) The Xpert Xpress SARS-CoV-2/FLU/RSV plus assay is intended as an aid in the diagnosis of influenza from Nasopharyngeal swab specimens and should not be used as a sole basis for treatment. Nasal washings and aspirates are unacceptable for Xpert Xpress SARS-CoV-2/FLU/RSV testing.  Fact Sheet for Patients: EntrepreneurPulse.com.au  Fact Sheet for Healthcare Providers: IncredibleEmployment.be  This test is not yet approved or cleared by the [place] FDA and has been authorized for detection and/or diagnosis of SARS-CoV-2 by FDA under an Emergency Use Authorization (EUA). This EUA will remain in effect (meaning this test can be used) for the duration of the COVID-19 declaration under Section 564(b)(1) of the Act, 21 U.S.C. section 360bbb-3(b)(1), unless the authorization is terminated or revoked.  Performed at  [facility], [place] [address] [place], [place] [address]   MRSA Next Gen by PCR, Nasal     Status: None   Collection Time: 08/28/20  5:36 AM   Specimen: Nasal Mucosa; Nasal Swab  Result Value Ref Range Status   MRSA by PCR Next Gen NOT DETECTED NOT DETECTED Final    Comment: (NOTE) The GeneXpert MRSA Assay (FDA approved for NASAL specimens only), is one component of a comprehensive MRSA colonization surveillance program. It is not intended to diagnose MRSA infection nor to guide or monitor treatment for MRSA infections. Test performance is not FDA approved in patients less than 59 years old. Performed at [facility], [place] [address], [place], [place] [address]        Radiology Studies: ECHOCARDIOGRAM COMPLETE  Result Date: 08/29/2020    ECHOCARDIOGRAM REPORT   Patient Name:   KASSIDEE NARCISO Date of Exam: 08/29/2020 Medical Rec #:  [id number]       Height:       62.0 in Accession #:    [id number]      Weight:       160.9 lb Date of Birth:  07/31/1945        BSA:          1.743 m Patient Age:    28 years        BP:           132/65 mmHg Patient Gender: F               HR:           95 bpm. Exam Location:  Inpatient Procedure: 2D Echo, 3D Echo, Cardiac Doppler and Color Doppler Indications:    I50.40* Unspecified combined systolic (congestive) and diastolic                 (congestive) heart failure  History:        Patient has prior history of Echocardiogram examinations, most                 recent 12/13/2019. Previous Myocardial Infarction and CAD,                 Signs/Symptoms:Chest Pain, Shortness of Breath and Dyspnea; Risk                 Factors:Dyslipidemia and Diabetes.  Sonographer:    Roseanna Rainbow RDCS Referring Phys: [id number] [facility]  Sonographer Comments: Technically difficult study due to poor echo windows. IMPRESSIONS  1. Left ventricular ejection fraction, by estimation, is 60 to 65%. The left ventricle has normal function. The left ventricle has no regional wall  motion abnormalities. Left ventricular diastolic parameters are consistent with Grade I diastolic dysfunction (impaired relaxation). Elevated left atrial pressure.  2. Right ventricular systolic function is normal. The right ventricular size is normal. There is normal pulmonary artery systolic pressure.  3. The mitral valve is normal in structure. Trivial mitral valve regurgitation. No evidence of mitral stenosis.  4. The aortic valve is tricuspid. Aortic valve regurgitation is not visualized. Mild aortic valve sclerosis is present, with no evidence of aortic valve stenosis.  5. The inferior vena cava is normal in size with greater than 50% respiratory variability, suggesting right atrial pressure of 3 mmHg. FINDINGS  Left Ventricle: Left ventricular ejection fraction, by estimation, is 60 to 65%. The left ventricle has normal function. The left ventricle has no regional wall motion abnormalities. The left ventricular internal cavity size was normal in size. There is  no left ventricular hypertrophy. Left ventricular diastolic parameters are consistent with Grade I diastolic dysfunction (impaired relaxation). Elevated left atrial pressure. Right Ventricle: The right ventricular size is normal. Right ventricular systolic function is normal. There is normal pulmonary artery systolic pressure. The tricuspid regurgitant velocity is 2.53 m/s, and with an assumed right atrial pressure of 8 mmHg,  the estimated right ventricular systolic pressure is 59.9 mmHg. Left Atrium: Left atrial size was normal in size. Right Atrium: Right atrial size was normal in size. Pericardium: There is no evidence of pericardial effusion. Mitral Valve: The mitral valve is normal in structure. Trivial mitral valve regurgitation. No evidence of mitral valve stenosis. Tricuspid Valve: The tricuspid valve is normal in structure. Tricuspid valve regurgitation is trivial. No evidence of tricuspid stenosis. Aortic Valve: The aortic valve is  tricuspid. Aortic valve regurgitation is not visualized. Mild aortic valve sclerosis is present, with no evidence of aortic valve stenosis. Pulmonic Valve: The pulmonic valve was normal in structure. Pulmonic valve regurgitation is not visualized. No evidence of pulmonic stenosis. Aorta: The aortic root is normal in size and structure. Venous: The inferior vena cava is normal in size with greater than 50% respiratory variability, suggesting right atrial pressure of 3 mmHg. IAS/Shunts: The interatrial septum is aneurysmal. No atrial level shunt detected by color flow Doppler.  LEFT VENTRICLE PLAX 2D LVIDd:         4.70 cm     Diastology LVIDs:         3.00 cm     LV e' medial:    5.22 cm/s LV PW:  0.90 cm     LV E/e' medial:  17.5 LV IVS:        0.90 cm     LV e' lateral:   19.30 cm/s LVOT diam:     2.30 cm     LV E/e' lateral: 4.7 LV SV:         111 LV SV Index:   64 LVOT Area:     4.15 cm  LV Volumes (MOD) LV vol d, MOD A2C: 83.7 ml LV vol d, MOD A4C: 67.6 ml LV vol s, MOD A2C: 28.5 ml LV vol s, MOD A4C: 25.1 ml LV SV MOD A2C:     55.2 ml LV SV MOD A4C:     67.6 ml LV SV MOD BP:      51.8 ml RIGHT VENTRICLE             IVC RV S prime:     16.30 cm/s  IVC diam: 2.00 cm TAPSE (M-mode): 2.7 cm LEFT ATRIUM             Index       RIGHT ATRIUM           Index LA diam:        4.10 cm 2.35 cm/m  RA Area:     16.30 cm LA Vol (A2C):   27.7 ml 15.89 ml/m RA Volume:   41.30 ml  23.69 ml/m LA Vol (A4C):   26.2 ml 15.03 ml/m LA Biplane Vol: 28.3 ml 16.24 ml/m  AORTIC VALVE LVOT Vmax:   164.00 cm/s LVOT Vmean:  108.000 cm/s LVOT VTI:    0.267 m  AORTA Ao Root diam: 3.30 cm MITRAL VALVE                TRICUSPID VALVE MV Area (PHT): 4.31 cm     TR Peak grad:   25.6 mmHg MV Decel Time: 176 msec     TR Vmax:        253.00 cm/s MV E velocity: 91.30 cm/s MV A velocity: 119.00 cm/s  SHUNTS MV E/A ratio:  0.77         Systemic VTI:  0.27 m                             Systemic Diam: 2.30 cm Kirk Ruths MD Electronically  signed by Kirk Ruths MD Signature Date/Time: 08/29/2020/1:56:14 PM    Final       Scheduled Meds:  aspirin EC  81 mg Oral Daily   atorvastatin  80 mg Oral QHS   docusate sodium  100 mg Oral BID   DULoxetine  60 mg Oral Daily   enoxaparin (LOVENOX) injection  40 mg Subcutaneous Q24H   feeding supplement  237 mL Oral TID BM   insulin aspart  0-9 Units Subcutaneous TID WC   loratadine  10 mg Oral Daily   LORazepam  1 mg Oral BID   mometasone-formoterol  2 puff Inhalation BID   multivitamin with minerals  1 tablet Oral Daily   pantoprazole  20 mg Oral BID   sodium chloride flush  3 mL Intravenous Q12H   ticagrelor  90 mg Oral BID   traZODone  50 mg Oral QHS   Continuous Infusions:     LOS: 2 days      Time spent: 20  minutes   Dessa Phi, DO [facility] 08/29/2020, 3:11 PM   Available via Epic secure chat  7am-7pm After these hours, please refer to coverage provider listed on [email or web address]

## 2020-08-30 LAB — GLUCOSE, CAPILLARY
Glucose-Capillary: 120 mg/dL — ABNORMAL HIGH (ref 70–99)
Glucose-Capillary: 219 mg/dL — ABNORMAL HIGH (ref 70–99)

## 2020-08-30 LAB — BASIC METABOLIC PANEL
Anion gap: 7 (ref 5–15)
BUN: 20 mg/dL (ref 8–23)
CO2: 23 mmol/L (ref 22–32)
Calcium: 8.9 mg/dL (ref 8.9–10.3)
Chloride: 111 mmol/L (ref 98–111)
Creatinine, Ser: 0.88 mg/dL (ref 0.44–1.00)
GFR, Estimated: >60 mL/min (ref >60)
Glucose, Bld: 160 mg/dL — ABNORMAL HIGH (ref 70–99)
Potassium: 3.7 mmol/L (ref 3.5–5.1)
Sodium: 141 mmol/L (ref 135–145)

## 2020-08-30 MED ORDER — POTASSIUM CHLORIDE CRYS ER 20 MEQ PO TBCR: 20.0000 meq | EXTENDED_RELEASE_TABLET | Freq: Every day | ORAL | 2 refills | Status: DC

## 2020-08-30 NOTE — Progress Notes (Signed)
Physical Therapy Treatment Patient Details Name: RECHEL DELOSREYES MRN: [id number] DOB: 05-06-1945 Today's Date: 08/30/2020    History of Present Illness Pt is a 75 y.o. female admitted 08/27/20 with fall in bathtub secondary to dizziness spell. Head CT showed L lateral scalp hematoma, no skull fx. Additional workup for AKI, orthostatic hypotension. PMH includes COVID (06/2019) with persistent dizziness and failure to thrive, CAD, DM, HF, depression, anxiety.   PT Comments    Pt progressing with mobility. Today's session focused on gait training with rollator; pt moving well with supervision for safety due tof all risk. Pt preparing for d/c home this afternoon; will have necessary support from family; pt declines HHPT services. If to remain admitted, will continue to follow acutely.  Post-ambulation - BP 133/75    Follow Up Recommendations  Home health PT;Supervision for mobility/OOB     Equipment Recommendations  None recommended by PT    Recommendations for Other Services       Precautions / Restrictions Precautions Precautions: Fall;Other (comment) Precaution Comments: h/o dizziness and orthostatic hypotension Restrictions Weight Bearing Restrictions: No    Mobility  Bed Mobility Overal bed mobility: Modified Independent             General bed mobility comments: received sitting EOB with bed alarming    Transfers Overall transfer level: Needs assistance Equipment used: 4-wheeled walker Transfers: Sit to/from Stand Sit to Stand: Supervision         General transfer comment: pt locking rollator brakes without cues; multiple sit<>stands from EOB and recliner without assist; supervision for safety  Ambulation/Gait Ambulation/Gait assistance: Supervision Gait Distance (Feet): 320 Feet Assistive device: 4-wheeled walker Gait Pattern/deviations: Step-through pattern;Decreased stride length;Drifts right/left Gait velocity: Decreased   General Gait Details: Slow,  mostly steady gait with rollator and supervision for safety; at times pt running into objects on R-side with rollator, typically able to self-correct, intermittent verbal cues to scan environment for safety; pt more distractable in busy hallway environment   Stairs             Wheelchair Mobility    Modified Rankin (Stroke Patients Only)       Balance Overall balance assessment: Needs assistance Sitting-balance support: No upper extremity supported;Feet supported Sitting balance-Leahy Scale: Good     Standing balance support: No upper extremity supported;During functional activity Standing balance-Leahy Scale: Fair Standing balance comment: can static stand and take steps without UE support; static and dynamic stability improved with rollator                            Cognition Arousal/Alertness: Awake/alert Behavior During Therapy: WFL for tasks assessed/performed Overall Cognitive Status: No family/caregiver present to determine baseline cognitive functioning Area of Impairment: Safety/judgement;Awareness;Attention                   Current Attention Level: Selective     Safety/Judgement: Decreased awareness of safety;Decreased awareness of deficits Awareness: Emergent   General Comments: Some decreased safety awareness and insight into deficits, associated decreased attention; oriented and following commands appropriately      Exercises      General Comments General comments (skin integrity, edema, etc.): Pt c/o some dizziness with mobility ("I'm always dizzy"); post-ambulation BP 133/75      Pertinent Vitals/Pain Pain Assessment: Faces Faces Pain Scale: Hurts a little bit Pain Location: headache Pain Descriptors / Indicators: Headache Pain Intervention(s): Monitored during session    Home Living  Prior Function            PT Goals (current goals can now be found in the care plan section) Progress  towards PT goals: Progressing toward goals    Frequency    Min 3X/week      PT Plan Current plan remains appropriate    Co-evaluation              AM-PAC PT "6 Clicks" Mobility   Outcome Measure  Help needed turning from your back to your side while in a flat bed without using bedrails?: None Help needed moving from lying on your back to sitting on the side of a flat bed without using bedrails?: None Help needed moving to and from a bed to a chair (including a wheelchair)?: A Little Help needed standing up from a chair using your arms (e.g., wheelchair or bedside chair)?: A Little Help needed to walk in hospital room?: A Little Help needed climbing 3-5 steps with a railing? : A Little 6 Click Score: 20    End of Session Equipment Utilized During Treatment: Gait belt Activity Tolerance: Patient tolerated treatment well Patient left: in chair;with call bell/phone within reach;with chair alarm set Nurse Communication: Mobility status PT Visit Diagnosis: Other abnormalities of gait and mobility (R26.89);Difficulty in walking, not elsewhere classified (R26.2)     Time: 6122-4497 PT Time Calculation (min) (ACUTE ONLY): 18 min  Charges:  $Gait Training: 8-22 mins                     Mabeline Caras, PT, DPT Acute Rehabilitation Services  Pager [phone] Office [phone]  Derry Lory 08/30/2020, 12:57 PM

## 2020-08-30 NOTE — Discharge Summary (Signed)
Physician Discharge Summary  JAYONA MCCAIG IRC:[id number] DOB: 02-22-1946 DOA: 08/27/2020  PCP: [name], MD  Admit date: 08/27/2020 Discharge date: 08/30/2020  Admitted From: Home Disposition: Home  Recommendations for Outpatient Follow-up:  Follow up with PCP in 1 week Outpatient palliative care medicine follow-up  Discharge Condition: Stable, improved CODE STATUS: DNR Diet recommendation: Regular diet  Brief/Interim Summary: Nicole [name] is a 75 y.o. female with medical history significant of CAD s/p stent; fibromyalgia; DM; HLD; and depression/anxiety presenting with generalized weakness.   She had COVID in April 2021 and is a long-hauler with weight loss, appetite loss, progressive decline.  She has dizzy spells, doesn't see real well, not driving.  Last night, she was going to the bathroom and got her walker tangled up.  She got very dizzy and fell into the bathtub.  No LOC.  She injured her L hip/knee and R side.  She continued to feel very dizzy.  She doesn't eat or drink well - she tries and is able to drink more than she eats.  She thinks she has lost 30+ pounds since March.  No urinary symptoms.  No respiratory symptoms. She was admitted with acute kidney injury, failure to thrive.  Patient was given IV fluid with gradual improvement in acute kidney injury.  Her heart failure medication as beta-blocker were held due to orthostatic hypotension.  These continue to improve.  She work with physical therapy and recommended for home health.  She was recommended to increase her p.o. intake at home.  She was also evaluated by palliative care medicine, palliative care services will continue to follow as outpatient.  Discharge Diagnoses:  Principal Problem:   AKI (acute kidney injury) ([place]) Active Problems:   Diabetes mellitus type 2 in obese ([place])   Mixed diabetic hyperlipidemia associated with type 2 diabetes mellitus ([place])   Chronic combined systolic and diastolic congestive  heart failure ([facility])   Failure to thrive in adult   AKI -Likely prerenal etiology -Baseline creatinine is 1.1 -Renal ultrasound unremarkable -Resolved, creatinine 0.88 on discharge   Failure to thrive -Unintentional weight loss and progressive debility since COVID in April 2021 -PT OT recommending home health therapy -Appreciate palliative care medicine -Confirmed DNR status with patient   Chronic combined CHF -Can resume Lasix, Entresto, Aldactone   CAD -Continue aspirin, Brilinta   Diabetes mellitus -Hemoglobin A1c 7.7 -Continue sliding scale insulin  Hypertension -Can resume Toprol   Hyperlipidemia -Continue Lipitor  Depression/anxiety -Continue Cymbalta, Ativan   Chronic pain syndrome -Continue home Percocet   Leukocytosis -Resolved, likely in setting of hemoconcentration -No signs or symptoms of infection -Lactic acid 0.8 -UA negative -Blood cultures negative to date -Monitor off antibiotics  Discharge Instructions  Discharge Instructions     (HEART FAILURE PATIENTS) Call MD:  Anytime you have any of the following symptoms: 1) 3 pound weight gain in 24 hours or 5 pounds in 1 week 2) shortness of breath, with or without a dry hacking cough 3) swelling in the hands, feet or stomach 4) if you have to sleep on extra pillows at night in order to breathe.   Complete by: As directed    Call MD for:  difficulty breathing, headache or visual disturbances   Complete by: As directed    Call MD for:  extreme fatigue   Complete by: As directed    Call MD for:  persistant dizziness or light-headedness   Complete by: As directed    Call MD for:  persistant nausea  and vomiting   Complete by: As directed    Call MD for:  severe uncontrolled pain   Complete by: As directed    Call MD for:  temperature >100.4   Complete by: As directed    Discharge instructions   Complete by: As directed    You were cared for by a hospitalist during your hospital stay. If you have  any questions about your discharge medications or the care you received while you were in the hospital after you are discharged, you can call the unit and ask to speak with the hospitalist on call if the hospitalist that took care of you is not available. Once you are discharged, your primary care physician will handle any further medical issues. Please note that NO REFILLS for any discharge medications will be authorized once you are discharged, as it is imperative that you return to your primary care physician (or establish a relationship with a primary care physician if you do not have one) for your aftercare needs so that they can reassess your need for medications and monitor your lab values.   Increase activity slowly   Complete by: As directed       Allergies as of 08/30/2020       Reactions   Fish-derived Products Anaphylaxis   Shellfish Allergy Shortness Of Breath   In dye for scan.   Aspirin Nausea Only, Other (See Comments)   Pt stated it only gives her problems when she takes the high dose (325 mg)   Benadryl [diphenhydramine]    "flips out with it. Eyes roll to the back of her head."   Ivp Dye [iodinated Diagnostic Agents]    Hx of ? anaphlaxis   Latex Other (See Comments)   Pt says she gets a "stinky infection"        Medication List     TAKE these medications    aspirin 81 MG tablet Take 81 mg by mouth daily.   atorvastatin 80 MG tablet Commonly known as: LIPITOR Take 1 tablet (80 mg total) by mouth at bedtime.   DULoxetine 60 MG capsule Commonly known as: CYMBALTA Take 60 mg by mouth daily.   ergocalciferol 1.25 MG (50000 UT) capsule Commonly known as: VITAMIN D2 Take 50,000 Units by mouth once a week.   fluticasone 50 MCG/ACT nasal spray Commonly known as: FLONASE Place 1 spray into both nostrils daily as needed for allergies or rhinitis.   Fluticasone-Salmeterol 250-50 MCG/DOSE Aepb Commonly known as: ADVAIR Inhale 1 puff into the lungs 2 (two) times  daily.   furosemide 40 MG tablet Commonly known as: LASIX Take 1 tablet (40 mg total) by mouth daily.   Janumet 50-500 MG tablet Generic drug: sitaGLIPtin-metformin Take 1 tablet by mouth daily.   loratadine 10 MG tablet Commonly known as: CLARITIN Take 1 tablet (10 mg total) by mouth daily.   LORazepam 1 MG tablet Commonly known as: ATIVAN Take 1 tablet (1 mg total) by mouth 2 (two) times daily.   meclizine 25 MG tablet Commonly known as: ANTIVERT Take 25 mg by mouth daily as needed for dizziness.   metoprolol succinate 50 MG 24 hr tablet Commonly known as: TOPROL-XL Take 1 tablet (50 mg total) by mouth daily. Take with or immediately following a meal.   OneTouch Delica Lancets 46F Misc   OneTouch Verio test strip Generic drug: glucose blood   oxyCODONE-acetaminophen 10-325 MG tablet Commonly known as: PERCOCET Take 1 tablet by mouth 2 (two) times daily as  needed for pain. What changed: when to take this   pantoprazole 20 MG tablet Commonly known as: PROTONIX Take 20 mg by mouth 2 (two) times daily.   Potassium Chloride ER 20 MEQ Tbcr Take 1 tablet by mouth daily.   ProAir HFA 108 (90 Base) MCG/ACT inhaler Generic drug: albuterol Inhale 2 puffs into the lungs every 4 (four) hours as needed for wheezing or shortness of breath. Uses as rescue inhaler Reported on 06/20/2015   promethazine 12.5 MG tablet Commonly known as: PHENERGAN Take 1 tablet (12.5 mg total) by mouth every 6 (six) hours as needed for nausea.   sacubitril-valsartan 97-103 MG Commonly known as: ENTRESTO Take 1 tablet by mouth 2 (two) times daily.   spironolactone 25 MG tablet Commonly known as: ALDACTONE Take 1 tablet (25 mg total) by mouth daily.   ticagrelor 90 MG Tabs tablet Commonly known as: BRILINTA Take 90 mg by mouth 2 (two) times daily.        Follow-up Information     [name], MD. Schedule an appointment as soon as possible for a visit in 1 week(s).   Specialty:  Endocrinology Contact information: [place] [address] [facility] up.   Specialty: Hospice and Palliative Medicine Why: outpatient palliative services Contact information: [place] [address] [phone]               Allergies  Allergen Reactions   Fish-Derived Products Anaphylaxis   Shellfish Allergy Shortness Of Breath    In dye for scan.   Aspirin Nausea Only and Other (See Comments)    Pt stated it only gives her problems when she takes the high dose (325 mg)   Benadryl [Diphenhydramine]     "flips out with it. Eyes roll to the back of her head."   Ivp Dye [Iodinated Diagnostic Agents]     Hx of ? anaphlaxis   Latex Other (See Comments)    Pt says she gets a "stinky infection"    Consultations: Palliative care medicine   Procedures/Studies: DG Chest 2 View  Result Date: 08/27/2020 CLINICAL DATA:  75 year old female with shortness of breath. Fall, struck back of head. Progressive weakness for 2 months. EXAM: CHEST - 2 VIEW COMPARISON:  Portable chest 01/10/2020 and earlier. FINDINGS: Upright AP and lateral views of the chest. Small to moderate hiatal hernia is chronic. Other mediastinal contours are within normal limits. Visualized tracheal air column is within normal limits. Relatively normal lung volumes. Both lungs appear clear. No pneumothorax or pleural effusion. No acute osseous abnormality identified. Stable cholecystectomy clips. Negative visible bowel gas pattern. IMPRESSION: 1. No acute cardiopulmonary abnormality. 2. Chronic hiatal hernia. Electronically Signed   By: Genevie Ann M.D.   On: 08/27/2020 04:23   CT Head Wo Contrast  Result Date: 08/27/2020 CLINICAL DATA:  75 year old female with shortness of breath. Fall, struck back of head. Progressive weakness for 2 months. EXAM: CT HEAD WITHOUT CONTRAST TECHNIQUE: Contiguous axial images were obtained from the base of the  skull through the vertex without intravenous contrast. COMPARISON:  Brain MRI, head CT 01/10/2020, and earlier. FINDINGS: Brain: Stable cerebral volume. No midline shift, ventriculomegaly, mass effect, evidence of mass lesion, intracranial hemorrhage or evidence of cortically based acute infarction. Gray-white matter differentiation is stable and largely normal for age, with only subtle subcortical white matter changes. Vascular: Calcified atherosclerosis at the skull base. No suspicious intracranial vascular hyperdensity. Skull: Stable,  intact.  Mild hyperostosis, normal variant. Sinuses/Orbits: Visualized paranasal sinuses and mastoids are stable and well aerated. Tympanic cavities are clear. Other: On series 4, image 49 left lateral convexity scalp hematoma. Underlying calvarium intact. No scalp soft tissue gas. No orbits soft tissue injury identified. IMPRESSION: 1. Left lateral convexity scalp hematoma without underlying skull fracture. 2. Stable and normal for age non contrast CT appearance of the brain. Electronically Signed   By: Genevie Ann M.D.   On: 08/27/2020 04:26   US RENAL  Result Date: 08/27/2020 CLINICAL DATA:  75 year old female with acute renal insufficiency. Sepsis. EXAM: RENAL / URINARY TRACT ULTRASOUND COMPLETE COMPARISON:  CT Abdomen and Pelvis 01/10/2020. FINDINGS: Right Kidney: Renal measurements: 11.0 x 4.6 x 5.5 cm = volume: 144 mL. Echogenicity within normal limits. No mass or hydronephrosis visualized. Left Kidney: Renal measurements: 12.1 x 4.7 x 4.2 cm = volume: 123 mL. Stable simple appearing left renal midpole cyst, 2.9 cm (image 38). Normal cortical echogenicity and cortical thickness. No hydronephrosis or left renal mass. Bladder: Not visualized, presumably decompressed. Other: None. IMPRESSION: Normal ultrasound appearance of both kidneys. Electronically Signed   By: Genevie Ann M.D.   On: 08/27/2020 11:15   ECHOCARDIOGRAM COMPLETE  Result Date: 08/29/2020    ECHOCARDIOGRAM REPORT    Patient Name:   Nicole [name] Date of Exam: 08/29/2020 Medical Rec #:  [id number]       Height:       62.0 in Accession #:    [id number]      Weight:       160.9 lb Date of Birth:  [birth year]        BSA:          1.743 m Patient Age:    8 years        BP:           132/65 mmHg Patient Gender: F               HR:           95 bpm. Exam Location:  Inpatient Procedure: 2D Echo, 3D Echo, Cardiac Doppler and Color Doppler Indications:    I50.40* Unspecified combined systolic (congestive) and diastolic                 (congestive) heart failure  History:        Patient has prior history of Echocardiogram examinations, most                 recent 12/13/2019. Previous Myocardial Infarction and CAD,                 Signs/Symptoms:Chest Pain, Shortness of Breath and Dyspnea; Risk                 Factors:Dyslipidemia and Diabetes.  Sonographer:    Roseanna Rainbow RDCS Referring Phys: [id number] [facility]  Sonographer Comments: Technically difficult study due to poor echo windows. IMPRESSIONS  1. Left ventricular ejection fraction, by estimation, is 60 to 65%. The left ventricle has normal function. The left ventricle has no regional wall motion abnormalities. Left ventricular diastolic parameters are consistent with Grade I diastolic dysfunction (impaired relaxation). Elevated left atrial pressure.  2. Right ventricular systolic function is normal. The right ventricular size is normal. There is normal pulmonary artery systolic pressure.  3. The mitral valve is normal in structure. Trivial mitral valve regurgitation. No evidence of mitral stenosis.  4. The aortic valve is tricuspid. Aortic valve regurgitation is not  visualized. Mild aortic valve sclerosis is present, with no evidence of aortic valve stenosis.  5. The inferior vena cava is normal in size with greater than 50% respiratory variability, suggesting right atrial pressure of 3 mmHg. FINDINGS  Left Ventricle: Left ventricular ejection fraction, by estimation, is 60 to 65%.  The left ventricle has normal function. The left ventricle has no regional wall motion abnormalities. The left ventricular internal cavity size was normal in size. There is  no left ventricular hypertrophy. Left ventricular diastolic parameters are consistent with Grade I diastolic dysfunction (impaired relaxation). Elevated left atrial pressure. Right Ventricle: The right ventricular size is normal. Right ventricular systolic function is normal. There is normal pulmonary artery systolic pressure. The tricuspid regurgitant velocity is 2.53 m/s, and with an assumed right atrial pressure of 8 mmHg,  the estimated right ventricular systolic pressure is 32.9 mmHg. Left Atrium: Left atrial size was normal in size. Right Atrium: Right atrial size was normal in size. Pericardium: There is no evidence of pericardial effusion. Mitral Valve: The mitral valve is normal in structure. Trivial mitral valve regurgitation. No evidence of mitral valve stenosis. Tricuspid Valve: The tricuspid valve is normal in structure. Tricuspid valve regurgitation is trivial. No evidence of tricuspid stenosis. Aortic Valve: The aortic valve is tricuspid. Aortic valve regurgitation is not visualized. Mild aortic valve sclerosis is present, with no evidence of aortic valve stenosis. Pulmonic Valve: The pulmonic valve was normal in structure. Pulmonic valve regurgitation is not visualized. No evidence of pulmonic stenosis. Aorta: The aortic root is normal in size and structure. Venous: The inferior vena cava is normal in size with greater than 50% respiratory variability, suggesting right atrial pressure of 3 mmHg. IAS/Shunts: The interatrial septum is aneurysmal. No atrial level shunt detected by color flow Doppler.  LEFT VENTRICLE PLAX 2D LVIDd:         4.70 cm     Diastology LVIDs:         3.00 cm     LV e' medial:    5.22 cm/s LV PW:         0.90 cm     LV E/e' medial:  17.5 LV IVS:        0.90 cm     LV e' lateral:   19.30 cm/s LVOT diam:      2.30 cm     LV E/e' lateral: 4.7 LV SV:         111 LV SV Index:   64 LVOT Area:     4.15 cm  LV Volumes (MOD) LV vol d, MOD A2C: 83.7 ml LV vol d, MOD A4C: 67.6 ml LV vol s, MOD A2C: 28.5 ml LV vol s, MOD A4C: 25.1 ml LV SV MOD A2C:     55.2 ml LV SV MOD A4C:     67.6 ml LV SV MOD BP:      51.8 ml RIGHT VENTRICLE             IVC RV S prime:     16.30 cm/s  IVC diam: 2.00 cm TAPSE (M-mode): 2.7 cm LEFT ATRIUM             Index       RIGHT ATRIUM           Index LA diam:        4.10 cm 2.35 cm/m  RA Area:     16.30 cm LA Vol (A2C):   27.7 ml 15.89 ml/m RA Volume:  41.30 ml  23.69 ml/m LA Vol (A4C):   26.2 ml 15.03 ml/m LA Biplane Vol: 28.3 ml 16.24 ml/m  AORTIC VALVE LVOT Vmax:   164.00 cm/s LVOT Vmean:  108.000 cm/s LVOT VTI:    0.267 m  AORTA Ao Root diam: 3.30 cm MITRAL VALVE                TRICUSPID VALVE MV Area (PHT): 4.31 cm     TR Peak grad:   25.6 mmHg MV Decel Time: 176 msec     TR Vmax:        253.00 cm/s MV E velocity: 91.30 cm/s MV A velocity: 119.00 cm/s  SHUNTS MV E/A ratio:  0.77         Systemic VTI:  0.27 m                             Systemic Diam: 2.30 cm Kirk Ruths MD Electronically signed by Kirk Ruths MD Signature Date/Time: 08/29/2020/1:56:14 PM    Final        Discharge Exam: Vitals:   08/30/20 0951 08/30/20 0953  BP: 115/73 122/62  Pulse: (!) 103 (!) 108  Resp:    Temp:    SpO2: 100% 92%    General: Pt is alert, awake, not in acute distress Cardiovascular: RRR, S1/S2 +, no edema Respiratory: CTA bilaterally, no wheezing, no rhonchi, no respiratory distress, no conversational dyspnea  Abdominal: Soft, NT, ND, bowel sounds + Extremities: no edema, no cyanosis Psych: Normal mood and affect, stable judgement and insight     The results of significant diagnostics from this hospitalization (including imaging, microbiology, ancillary and laboratory) are listed below for reference.     Microbiology: Recent Results (from the past 240 hour(s))  Culture,  blood (single)     Status: None (Preliminary result)   Collection Time: 08/27/20  5:30 AM   Specimen: BLOOD RIGHT WRIST  Result Value Ref Range Status   Specimen Description BLOOD RIGHT WRIST  Final   Special Requests   Final    BOTTLES DRAWN AEROBIC AND ANAEROBIC Blood Culture adequate volume   Culture   Final    NO GROWTH 3 DAYS Performed at [facility], [address] [place], [place] [address]    Report Status PENDING  Incomplete  Blood culture (routine x 2)     Status: None (Preliminary result)   Collection Time: 08/27/20  5:30 AM   Specimen: BLOOD  Result Value Ref Range Status   Specimen Description BLOOD RIGHT ANTECUBITAL  Final   Special Requests   Final    BOTTLES DRAWN AEROBIC AND ANAEROBIC Blood Culture results may not be optimal due to an inadequate volume of blood received in culture bottles   Culture   Final    NO GROWTH 3 DAYS Performed at [facility], [place] [address], [place], [place] [address]    Report Status PENDING  Incomplete  Resp Panel by RT-PCR (Flu A&B, Covid) Nasopharyngeal Swab     Status: None   Collection Time: 08/27/20  5:50 AM   Specimen: Nasopharyngeal Swab; Nasopharyngeal(NP) swabs in vial transport medium  Result Value Ref Range Status   SARS Coronavirus 2 by RT PCR NEGATIVE NEGATIVE Final    Comment: (NOTE) SARS-CoV-2 target nucleic acids are NOT DETECTED.  The SARS-CoV-2 RNA is generally detectable in upper respiratory specimens during the acute phase of infection. The lowest concentration of SARS-CoV-2 viral copies  this assay can detect is 138 copies/mL. A negative result does not preclude SARS-Cov-2 infection and should not be used as the sole basis for treatment or other patient management decisions. A negative result may occur with  improper specimen collection/handling, submission of specimen other than nasopharyngeal swab, presence of viral mutation(s) within the areas targeted by this assay, and inadequate number of  viral copies(<138 copies/mL). A negative result must be combined with clinical observations, patient history, and epidemiological information. The expected result is Negative.  Fact Sheet for Patients:  EntrepreneurPulse.com.au  Fact Sheet for Healthcare Providers:  IncredibleEmployment.be  This test is no t yet approved or cleared by the [place] FDA and  has been authorized for detection and/or diagnosis of SARS-CoV-2 by FDA under an Emergency Use Authorization (EUA). This EUA will remain  in effect (meaning this test can be used) for the duration of the COVID-19 declaration under Section 564(b)(1) of the Act, 21 U.S.C.section 360bbb-3(b)(1), unless the authorization is terminated  or revoked sooner.       Influenza A by PCR NEGATIVE NEGATIVE Final   Influenza B by PCR NEGATIVE NEGATIVE Final    Comment: (NOTE) The Xpert Xpress SARS-CoV-2/FLU/RSV plus assay is intended as an aid in the diagnosis of influenza from Nasopharyngeal swab specimens and should not be used as a sole basis for treatment. Nasal washings and aspirates are unacceptable for Xpert Xpress SARS-CoV-2/FLU/RSV testing.  Fact Sheet for Patients: EntrepreneurPulse.com.au  Fact Sheet for Healthcare Providers: IncredibleEmployment.be  This test is not yet approved or cleared by the [place] FDA and has been authorized for detection and/or diagnosis of SARS-CoV-2 by FDA under an Emergency Use Authorization (EUA). This EUA will remain in effect (meaning this test can be used) for the duration of the COVID-19 declaration under Section 564(b)(1) of the Act, 21 U.S.C. section 360bbb-3(b)(1), unless the authorization is terminated or revoked.  Performed at [facility], [place] [address] [place], [place] [address]   MRSA Next Gen by PCR, Nasal     Status: None   Collection Time: 08/28/20  5:36 AM   Specimen: Nasal Mucosa; Nasal  Swab  Result Value Ref Range Status   MRSA by PCR Next Gen NOT DETECTED NOT DETECTED Final    Comment: (NOTE) The GeneXpert MRSA Assay (FDA approved for NASAL specimens only), is one component of a comprehensive MRSA colonization surveillance program. It is not intended to diagnose MRSA infection nor to guide or monitor treatment for MRSA infections. Test performance is not FDA approved in patients less than 67 years old. Performed at [facility], [place] [address] [place], [place] [address]      Labs: BNP (last 3 results) Recent Labs    10/16/19 1615 01/10/20 1626  BNP 504.9* 29.5   Basic Metabolic Panel: Recent Labs  Lab 08/27/20 0319 08/27/20 0348 08/28/20 0421 08/29/20 0308 08/30/20 0228  NA 134* 136 140 142 141  K 2.9* 2.9* 3.1* 3.7 3.7  CL 99 103 116* 117* 111  CO2 16*  --  16* 20* 23  GLUCOSE 162* 159* 93 184* 160*  BUN 106* 121* 72* 40* 20  CREATININE 4.96* 5.30* 2.45* 1.28* 0.88  CALCIUM 8.2*  --  7.1* 8.0* 8.9  MG  --   --  0.5* 1.8  --    Liver Function Tests: Recent Labs  Lab 08/27/20 0319  AST 19  ALT 24  ALKPHOS 90  BILITOT 1.3*  PROT 8.1  ALBUMIN 4.3   No  results for input(s): LIPASE, AMYLASE in the last 168 hours. No results for input(s): AMMONIA in the last 168 hours. CBC: Recent Labs  Lab 08/27/20 0319 08/27/20 0348 08/28/20 0421 08/29/20 0308  WBC 19.1*  --  6.8 5.4  NEUTROABS 14.8*  --   --   --   HGB 13.4 14.3 10.4* 9.6*  HCT 39.6 42.0 29.5* 27.6*  MCV 92.1  --  91.0 91.1  PLT 327  --  181 170   Cardiac Enzymes: No results for input(s): CKTOTAL, CKMB, CKMBINDEX, TROPONINI in the last 168 hours. BNP: Invalid input(s): POCBNP CBG: Recent Labs  Lab 08/28/20 2116 08/29/20 0635 08/29/20 1504 08/29/20 2100 08/30/20 0533  GLUCAP 89 90 169* 196* 120*   D-Dimer No results for input(s): DDIMER in the last 72 hours. Hgb A1c Recent Labs    08/28/20 0421  HGBA1C 7.7*   Lipid Profile No results for input(s): CHOL,  HDL, LDLCALC, TRIG, CHOLHDL, LDLDIRECT in the last 72 hours. Thyroid function studies No results for input(s): TSH, T4TOTAL, T3FREE, THYROIDAB in the last 72 hours.  Invalid input(s): FREET3 Anemia work up No results for input(s): VITAMINB12, FOLATE, FERRITIN, TIBC, IRON, RETICCTPCT in the last 72 hours. Urinalysis    Component Value Date/Time   COLORURINE YELLOW 08/27/2020 0320   APPEARANCEUR CLEAR 08/27/2020 0320   LABSPEC 1.010 08/27/2020 0320   PHURINE 5.5 08/27/2020 0320   GLUCOSEU NEGATIVE 08/27/2020 0320   HGBUR NEGATIVE 08/27/2020 0320   BILIRUBINUR NEGATIVE 08/27/2020 0320   KETONESUR NEGATIVE 08/27/2020 0320   PROTEINUR NEGATIVE 08/27/2020 0320   UROBILINOGEN 0.2 02/23/2014 1528   NITRITE NEGATIVE 08/27/2020 0320   LEUKOCYTESUR NEGATIVE 08/27/2020 0320   Sepsis Labs Invalid input(s): PROCALCITONIN,  WBC,  LACTICIDVEN Microbiology Recent Results (from the past 240 hour(s))  Culture, blood (single)     Status: None (Preliminary result)   Collection Time: 08/27/20  5:30 AM   Specimen: BLOOD RIGHT WRIST  Result Value Ref Range Status   Specimen Description BLOOD RIGHT WRIST  Final   Special Requests   Final    BOTTLES DRAWN AEROBIC AND ANAEROBIC Blood Culture adequate volume   Culture   Final    NO GROWTH 3 DAYS Performed at [facility], [place] [address] [place], [place] [address]    Report Status PENDING  Incomplete  Blood culture (routine x 2)     Status: None (Preliminary result)   Collection Time: 08/27/20  5:30 AM   Specimen: BLOOD  Result Value Ref Range Status   Specimen Description BLOOD RIGHT ANTECUBITAL  Final   Special Requests   Final    BOTTLES DRAWN AEROBIC AND ANAEROBIC Blood Culture results may not be optimal due to an inadequate volume of blood received in culture bottles   Culture   Final    NO GROWTH 3 DAYS Performed at [facility], [place] [address] [place], [place] [address]    Report Status PENDING  Incomplete  Resp Panel by  RT-PCR (Flu A&B, Covid) Nasopharyngeal Swab     Status: None   Collection Time: 08/27/20  5:50 AM   Specimen: Nasopharyngeal Swab; Nasopharyngeal(NP) swabs in vial transport medium  Result Value Ref Range Status   SARS Coronavirus 2 by RT PCR NEGATIVE NEGATIVE Final    Comment: (NOTE) SARS-CoV-2 target nucleic acids are NOT DETECTED.  The SARS-CoV-2 RNA is generally detectable in upper respiratory specimens during the acute phase of infection. The lowest concentration of SARS-CoV-2 viral copies this assay can detect  is 138 copies/mL. A negative result does not preclude SARS-Cov-2 infection and should not be used as the sole basis for treatment or other patient management decisions. A negative result may occur with  improper specimen collection/handling, submission of specimen other than nasopharyngeal swab, presence of viral mutation(s) within the areas targeted by this assay, and inadequate number of viral copies(<138 copies/mL). A negative result must be combined with clinical observations, patient history, and epidemiological information. The expected result is Negative.  Fact Sheet for Patients:  EntrepreneurPulse.com.au  Fact Sheet for Healthcare Providers:  IncredibleEmployment.be  This test is no t yet approved or cleared by the [place] FDA and  has been authorized for detection and/or diagnosis of SARS-CoV-2 by FDA under an Emergency Use Authorization (EUA). This EUA will remain  in effect (meaning this test can be used) for the duration of the COVID-19 declaration under Section 564(b)(1) of the Act, 21 U.S.C.section 360bbb-3(b)(1), unless the authorization is terminated  or revoked sooner.       Influenza A by PCR NEGATIVE NEGATIVE Final   Influenza B by PCR NEGATIVE NEGATIVE Final    Comment: (NOTE) The Xpert Xpress SARS-CoV-2/FLU/RSV plus assay is intended as an aid in the diagnosis of influenza from Nasopharyngeal swab  specimens and should not be used as a sole basis for treatment. Nasal washings and aspirates are unacceptable for Xpert Xpress SARS-CoV-2/FLU/RSV testing.  Fact Sheet for Patients: EntrepreneurPulse.com.au  Fact Sheet for Healthcare Providers: IncredibleEmployment.be  This test is not yet approved or cleared by the [place] FDA and has been authorized for detection and/or diagnosis of SARS-CoV-2 by FDA under an Emergency Use Authorization (EUA). This EUA will remain in effect (meaning this test can be used) for the duration of the COVID-19 declaration under Section 564(b)(1) of the Act, 21 U.S.C. section 360bbb-3(b)(1), unless the authorization is terminated or revoked.  Performed at [facility], [place] [address] [place], [place] [address]   MRSA Next Gen by PCR, Nasal     Status: None   Collection Time: 08/28/20  5:36 AM   Specimen: Nasal Mucosa; Nasal Swab  Result Value Ref Range Status   MRSA by PCR Next Gen NOT DETECTED NOT DETECTED Final    Comment: (NOTE) The GeneXpert MRSA Assay (FDA approved for NASAL specimens only), is one component of a comprehensive MRSA colonization surveillance program. It is not intended to diagnose MRSA infection nor to guide or monitor treatment for MRSA infections. Test performance is not FDA approved in patients less than 32 years old. Performed at [facility], [place] [address], [place],  [address]      Patient was seen and examined on the day of discharge and was found to be in stable condition. Time coordinating discharge: 25 minutes including assessment and coordination of care, as well as examination of the patient.   SIGNED:  Dessa Phi, DO [facility] 08/30/2020, 10:27 AM

## 2020-08-30 NOTE — Care Management Important Message (Signed)
Important Message  Patient Details  Name: Nicole [name] MRN: [id number] Date of Birth: 06-Feb-1946   Medicare Important Message Given:  Yes     Orbie Pyo 08/30/2020, 4:24 PM

## 2020-08-30 NOTE — Progress Notes (Signed)
[facility] ([facility])  Hospital Liaison RN note         Notified by TOC manager of patient/family request for [facility] Palliative services at home after discharge.              [facility] Palliative team will follow up with patient after discharge.         Please call with any hospice or palliative related questions.         Thank you for the opportunity to participate in this patient's care.     Chrislyn King, BSN, RN [facility] Hospital Liaison (listed on [facility] under Hosp[facility])    [phone] (24h on call)    

## 2020-08-31 NOTE — Progress Notes (Signed)
Cardiology Office Note   Date:  09/04/2020   ID:  THU BAGGETT, DOB 1946/01/06, MRN [id number]  PCP:  [name], MD  Cardiologist:   Nathanie Ottley Martinique, MD   Chief Complaint  Patient presents with   Follow-up    Echo.   Headache   Shortness of Breath   Edema   Chest Pain       History of Present Illness: Nicole [name] is a 75 y.o. female who presents for follow up CHF. She has a PMH of coronary artery disease status post PCI, acute combined systolic and diastolic CHF, diabetes mellitus, depression, GERD, DOE, cardiomyopathy, and aphasia.    She was diagnosed with COVID-19 pneumonia 4/21.  She presented to the emergency department on 10/16/2019 with complaints of progressively worsening shortness of breath, DOE, and generalized weakness.  On arrival to the ED her high-sensitivity troponins were slightly elevated.  Her EKG showed sinus tachycardia with left atrial enlargement and possible lateral injury.  Her CXR showed cardiomegaly.  She was also noted to have some ST elevation in V3-V6 and LVH.  Her echocardiogram 8 /7 showed an EF of 20-25%,Global hypokinesis, G2 DD.  She underwent cardiac catheterization on 10/21/2019 which showed first marginal 95% stenosed with no other significant stenosis. OM was treated with DES.   She was managed medically with DAPT, metoprolol, diuresis, Entresto and Farxiga. Seen in office 8/31 with improvement.   She was admitted in early October with acute severe sepsis. Unknown source. Had a troponin leak felt to be due to demand ischemia. Repeat Echo showed EF 25-30%. Managed again medically. Entresto and Toprol doses increased.  Wilder Glade was held.  She was readmitted on November 1 with urosepsis. Treated with antibiotics. Had vertigo symptoms with negative cranial CT and MRI.   She was admitted from 6/19-6/22 with failure to thrive and AKI. and has done poorly since then with progressive weight loss. Her CHF meds were held due to hypotension and her  creatinine normalized with hydration. Sent home with palliative care follow up and meds were resumed at discharge.   Repeat Echo on 08/29/20 showed normalization of EF to 60-65%.    On follow up today she is seen with her daughter. She reports her breathing is doing well. When she first went home from the hospital she was swollen but this has resolved. No edema. No chest pain. She has some chronic dizziness. No chest pain. Feels her energy is slow.   Past Medical History:  Diagnosis Date   Anemia    Anxiety    Back pain    CAD (coronary artery disease) 10/22/2019   DES to [facility] August 2021   Colon polyps    COVID-28 June 2019   Endometriosis    Fibromyalgia    GERD with esophagitis    Follows with Dr. 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 mellitus ([place])    Type 2 diabetes mellitus ([place])    Urinary incontinence     Past Surgical History:  Procedure Laterality Date   ABDOMINAL HYSTERECTOMY     TAH BSO   APPENDECTOMY  1999   CATARACT EXTRACTION     both   CHOLECYSTECTOMY     CORONARY STENT INTERVENTION N/A 10/21/2019   Procedure: CORONARY STENT INTERVENTION;  Surgeon:  Troy Sine, MD;  Location: [facility];  Service: Cardiovascular;  Laterality: N/A;   DENTAL SURGERY     HERNIA REPAIR  2000   LAPAROSCOPIC ENDOMETRIOSIS FULGURATION     RIGHT/LEFT HEART CATH AND CORONARY ANGIOGRAPHY N/A 10/21/2019   Procedure: RIGHT/LEFT HEART CATH AND CORONARY ANGIOGRAPHY;  Surgeon: Troy Sine, MD;  Location: [facility];  Service: Cardiovascular;  Laterality: N/A;     Current Outpatient Medications  Medication Sig Dispense Refill   aspirin 81 MG tablet Take 81 mg by mouth daily.     atorvastatin (LIPITOR) 80 MG tablet Take 1 tablet (80 mg total) by mouth at bedtime. 30 tablet 11   DULoxetine (CYMBALTA) 60  MG capsule Take 60 mg by mouth daily.     ergocalciferol (VITAMIN D2) 50000 UNITS capsule Take 50,000 Units by mouth once a week.     fluticasone (FLONASE) 50 MCG/ACT nasal spray Place 1 spray into both nostrils daily as needed for allergies or rhinitis.      Fluticasone-Salmeterol (ADVAIR) 250-50 MCG/DOSE AEPB Inhale 1 puff into the lungs 2 (two) times daily.     furosemide (LASIX) 40 MG tablet Take 1 tablet (40 mg total) by mouth daily. 90 tablet 3   JANUMET 50-500 MG tablet Take 1 tablet by mouth daily.     loratadine (CLARITIN) 10 MG tablet Take 1 tablet (10 mg total) by mouth daily. 30 tablet 0   LORazepam (ATIVAN) 1 MG tablet Take 1 tablet (1 mg total) by mouth 2 (two) times daily. 3 tablet 0   meclizine (ANTIVERT) 25 MG tablet Take 25 mg by mouth daily as needed for dizziness.     metoprolol succinate (TOPROL-XL) 50 MG 24 hr tablet Take 1 tablet (50 mg total) by mouth daily. Take with or immediately following a meal. 90 tablet 3   ONETOUCH DELICA LANCETS 85U MISC      ONETOUCH VERIO test strip      oxyCODONE-acetaminophen (PERCOCET) 10-325 MG tablet Take 1 tablet by mouth 2 (two) times daily as needed for pain. 3 tablet 0   pantoprazole (PROTONIX) 20 MG tablet Take 20 mg by mouth 2 (two) times daily.     potassium chloride SA (KLOR-CON) 20 MEQ tablet Take 1 tablet (20 mEq total) by mouth daily. 30 tablet 2   PROAIR HFA 108 (90 Base) MCG/ACT inhaler Inhale 2 puffs into the lungs every 4 (four) hours as needed for wheezing or shortness of breath. Uses as rescue inhaler Reported on 06/20/2015 6.7 g 0   promethazine (PHENERGAN) 12.5 MG tablet Take 1 tablet (12.5 mg total) by mouth every 6 (six) hours as needed for nausea. 20 tablet 0   sacubitril-valsartan (ENTRESTO) 97-103 MG Take 1 tablet by mouth 2 (two) times daily. 60 tablet 5   ticagrelor (BRILINTA) 90 MG TABS tablet Take 90 mg by mouth 2 (two) times daily.     No current facility-administered medications for this visit.    Allergies:    Fish-derived products, Shellfish allergy, Aspirin, Benadryl [diphenhydramine], Ivp dye [iodinated diagnostic agents], and Latex    Social History:  The patient  reports that she has never smoked. She has never used smokeless tobacco. She reports previous alcohol use. She reports that she does not use drugs.   Family History:  The patient's family history includes Cancer in her brother, brother, paternal aunt, and paternal uncle; Diabetes in her daughter, father, and mother; Heart attack in her father; Heart failure in her father and  mother; Hypertension in her father, maternal grandfather, maternal grandmother, mother, paternal grandfather, and paternal grandmother; Stroke in her father; Thyroid disease in her brother.    ROS:  Please see the history of present illness.   Otherwise, review of systems are positive for none.   All other systems are reviewed and negative.    PHYSICAL EXAM: VS:  BP 124/62 (BP Location: Left Arm, Patient Position: Sitting, Cuff Size: Normal)   Pulse 81   Ht 5\' 2"  (1.575 m)   Wt 163 lb (73.9 kg)   BMI 29.81 kg/m  , BMI Body mass index is 29.81 kg/m. GEN: Well nourished, well developed, in no acute distress  HEENT: normal  Neck: no JVD, carotid bruits, or masses Cardiac: RRR; no murmurs, rubs, or gallops,no edema  Respiratory:  clear to auscultation bilaterally, normal work of breathing GI: soft, nontender, nondistended, + BS MS: no deformity or atrophy  Skin: warm and dry, no rash Neuro:  Strength and sensation are intact Psych: euthymic mood, full affect   EKG:  EKG is not ordered today. The ekg ordered today demonstrates N/A   Recent Labs: 01/10/2020: B Natriuretic Peptide 57.1 01/11/2020: TSH 0.945 08/27/2020: ALT 24 08/29/2020: Hemoglobin 9.6; Magnesium 1.8; Platelets 170 08/30/2020: BUN 20; Creatinine, Ser 0.88; Potassium 3.7; Sodium 141    Lipid Panel    Component Value Date/Time   CHOL 68 12/16/2019 0237   TRIG 123 12/16/2019 0237   HDL  22 (L) 12/16/2019 0237   CHOLHDL 3.1 12/16/2019 0237   VLDL 25 12/16/2019 0237   LDLCALC 21 12/16/2019 0237      Wt Readings from Last 3 Encounters:  09/04/20 163 lb (73.9 kg)  08/30/20 168 lb 1.6 oz (76.2 kg)  04/19/20 153 lb 3.2 oz (69.5 kg)      Other studies Reviewed: Additional studies/ records that were reviewed today include:   Cardiac cath/PCI 10/21/19:  CORONARY STENT INTERVENTION  RIGHT/LEFT HEART CATH AND CORONARY ANGIOGRAPHY  Conclusion    1st Mrg lesion is 95% stenosed. Post intervention, there is a 0% residual stenosis. Prox RCA to Mid RCA lesion is 20% stenosed. Prox LAD lesion is 20% stenosed. Mid LAD lesion is 40% stenosed. A stent was successfully placed.   Coronary obstructive disease with 20% proximal and 40% smooth mid LAD stenoses; 30% proximal circumflex stenosis with 95% near ostial stenosis in a bifurcating OM1 vessel; and 20% mid RCA narrowing.   Normal to minimally increased right heart pressures; mean PA pressure 21 mmHg   Successful PCI to 95% very proximal circumflex marginal stenosis with insertion of a 2.25 x 8 mm DES stent with the stenosis being reduced to 0%.   RECOMMENDATION: Recommend DAPT for 12 months.  Guideline directed medical therapy for the patient's significant cardiomyopathy which is out of proportion to her high-grade circumflex marginal stenosis.  Aggressive lipid-lowering therapy with target LDL less than 70.  Echo 12/13/19: IMPRESSIONS     1. EF similar to August diffuse hypokinesis worse in the septum , apex  and inferior walls Basal lateral wall only area with preserved function .  Left ventricular ejection fraction, by estimation, is 25 to 30%. The left  ventricle has severely decreased  function. The left ventricle demonstrates regional wall motion  abnormalities (see scoring diagram/findings for description). The left  ventricular internal cavity size was moderately dilated. Left ventricular  diastolic parameters  were normal.   2. Right ventricular systolic function was not well visualized. The right  ventricular size is not well  visualized. There is mildly elevated  pulmonary artery systolic pressure.   3. The mitral valve is normal in structure. Trivial mitral valve  regurgitation. No evidence of mitral stenosis.   4. Tricuspid valve regurgitation is moderate.   5. The aortic valve is normal in structure. Aortic valve regurgitation is  not visualized. Mild to moderate aortic valve sclerosis/calcification is  present, without any evidence of aortic stenosis.   6. The inferior vena cava is normal in size with greater than 50%  respiratory variability, suggesting right atrial pressure of 3 mmHg.   Echo 08/29/20: IMPRESSIONS     1. Left ventricular ejection fraction, by estimation, is 60 to 65%. The  left ventricle has normal function. The left ventricle has no regional  wall motion abnormalities. Left ventricular diastolic parameters are  consistent with Grade I diastolic  dysfunction (impaired relaxation). Elevated left atrial pressure.   2. Right ventricular systolic function is normal. The right ventricular  size is normal. There is normal pulmonary artery systolic pressure.   3. The mitral valve is normal in structure. Trivial mitral valve  regurgitation. No evidence of mitral stenosis.   4. The aortic valve is tricuspid. Aortic valve regurgitation is not  visualized. Mild aortic valve sclerosis is present, with no evidence of  aortic valve stenosis.   5. The inferior vena cava is normal in size with greater than 50%  respiratory variability, suggesting right atrial pressure of 3 mmHg.   ASSESSMENT AND PLAN:  1. Chronic combined systolic/diastolic CHF-EF 55-37% initially. nonischemic.  Underwent cardiac catheterization 10/21/2019 which showed 95% OM lesion was treated with PCI/DES x1, 20% medial RCA, 20% proximal LAD had 40% medial LAD.  It was felt that her cardiomyopathy was out of  proportion to her  OM stenosis.  Persistent low EF by Echo in October but had recurrent urosepsis then.  She was on optimal therapy with maximum dose Entresto. On Toprol XL, lasix, and aldactone.  On recent hospital stay EF had normalized to 60-65%.  Given recent AKI I have recommended stopping aldactone and reduce lasix to 20 mg daily She is seeing Dr Forde Dandy in 2 days- can repeat BMET then.    2. Coronary artery disease-s/p DES of OM 1 in August. She is asymptomatic.  Continue aspirin and Brilinta for one year- stop Brilinta in August.  continue metoprolol, atorvastatin   3. Essential hypertension-BP well controlled. Continue metoprolol, entresto    4. Type 2 diabetes-A1c 7.7 on recent hospital admission. States her BS are doing well. Followed by PCP   5. Recurrent urosepsis.  Avoid SGLT 2 inhibitors.   6. S/p Covid 9 infection April 2021 with failure to thrive  7. AKI. Reduce diuretics as noted above. If Renal function worsens may need to stop Entresto as well.   Current medicines are reviewed at length with the patient today.  The patient does not have concerns regarding medicines.  The following changes have been made:  See above  Labs/ tests ordered today include:   No orders of the defined types were placed in this encounter.    Disposition:   FU with me in 4 months  Signed, Tajanae Guilbault Martinique, MD  09/04/2020 2:06 PM    [facility] [address] [place], [place], [address] Phone [phone], Fax [phone]

## 2020-09-01 LAB — CULTURE, BLOOD (ROUTINE X 2): Culture: NO GROWTH

## 2020-09-01 LAB — CULTURE, BLOOD (SINGLE)
Culture: NO GROWTH
Special Requests: ADEQUATE

## 2020-09-04 ENCOUNTER — Other Ambulatory Visit: Payer: Self-pay

## 2020-09-04 ENCOUNTER — Ambulatory Visit: Payer: Medicare Other | Admitting: Cardiology

## 2020-09-04 ENCOUNTER — Encounter: Payer: Self-pay | Admitting: Cardiology

## 2020-09-04 VITALS — BP 124/62 | HR 81 | Ht 62.0 in | Wt 163.0 lb

## 2020-09-04 DIAGNOSIS — I2510 Atherosclerotic heart disease of native coronary artery without angina pectoris: Secondary | ICD-10-CM | POA: Diagnosis not present

## 2020-09-04 DIAGNOSIS — I5022 Chronic systolic (congestive) heart failure: Secondary | ICD-10-CM | POA: Diagnosis not present

## 2020-09-04 DIAGNOSIS — N179 Acute kidney failure, unspecified: Secondary | ICD-10-CM | POA: Diagnosis not present

## 2020-09-04 DIAGNOSIS — I10 Essential (primary) hypertension: Secondary | ICD-10-CM

## 2020-09-04 MED ORDER — FUROSEMIDE 40 MG PO TABS: 20.0000 mg | ORAL_TABLET | Freq: Every day | ORAL | 3 refills | Status: DC

## 2020-09-04 NOTE — Patient Instructions (Addendum)
Stop taking aldactone (sprionolactone)  Reduce lasix (furosemide) to 20 mg daily  Dr Forde Dandy should check your kidney function and potassium this week.  You may stop Brilinta in August   Follow up in 4 months

## 2020-09-18 ENCOUNTER — Telehealth: Payer: Self-pay

## 2020-09-18 NOTE — Telephone Encounter (Signed)
Spoke with patient's daughter [name]. She requested to call back this afternoon after discussing PC with patient.

## 2020-09-21 ENCOUNTER — Telehealth: Payer: Self-pay | Admitting: Cardiology

## 2020-09-21 MED ORDER — FUROSEMIDE 40 MG PO TABS: 20.0000 mg | ORAL_TABLET | Freq: Every day | ORAL | 3 refills | Status: DC | PRN

## 2020-09-21 NOTE — Telephone Encounter (Signed)
Pt had labwork last week at her PCP office, Dr. Forde Dandy stated that pt's kidney levels are elevated and pt's meds may need to be altered. Please advise pt's daughter [name] [phone] with any questions or concerns.

## 2020-09-21 NOTE — Telephone Encounter (Signed)
Returned call to patients daughter (okay per DPR) who states that she was calling in regard to patients recent BMET results from her PCP.   Patient's daughter states that results have been faxed from patients PCP to the office for Dr. Martinique to review to see if there were any medication changes that needed to take place. She reports that patient's creatinine was 1.5 and BUN 28.   Patients daughter also reports that patient has had upset stomach, and fatigue since starting Janumet from her PCP. Per Patients daughter, patient has no other symptoms. Advised patients daughter to contact patients PCP regarding these issues. Advised patients daughter that I would forward message to Dr. Martinique so he can review and advise. Patients daughter verbalized understanding.

## 2020-09-21 NOTE — Telephone Encounter (Signed)
Her renal parameters are not at the point where we need to change her meds. On her visit last month we stopped her aldactone and reduced her lasix. Lasix and Entresto are the only meds she is on that would affect her renal function. She could reduce lasix to PRN for swelling or weight gain but that is as far as I would go. Her GI complaints are more related to her diabetic meds.  Odella Appelhans Martinique MD, [facility]

## 2020-09-21 NOTE — Telephone Encounter (Signed)
Returned call to patients daughter (okay per DPR). Advised patients daughter of Dr. Doug Sou recommendations. Advised patients daughter that Lasix dosage has been updated in patient chart, and to call back to office with any issues, or weight gain of 3 or more pounds over night or 5 pounds in one week. Patients daughter verbalized understanding and is following up with patients PCP regarding her GI issues.   Advised patients daughter to call back with any issues, questions, or concerns. Patients daughter verbalized understanding.

## 2020-09-26 ENCOUNTER — Telehealth: Payer: Self-pay

## 2020-09-26 NOTE — Telephone Encounter (Signed)
Attempted to contact patient's daughter [name] to schedule a Palliative Care consult appointment. No answer left a message to return call. My chart message sent also.

## 2020-10-04 ENCOUNTER — Telehealth: Payer: Self-pay

## 2020-10-04 NOTE — Telephone Encounter (Signed)
Spoke with patient and scheduled an in-person Palliative Consult for 10/31/20 @ 3 PM with Dr. Hollace Kinnier. Documentation will be noted in [place].   COVID screening was negative. One dog in the home will put away before provider arrives. Patient lives alone but daughter [name] is staying with her currently.    Consent obtained; updated Outlook/Netsmart/Team List and Epic.   Patient is aware she may be receiving a call from provider the day before or day of to confirm appointment.

## 2020-10-27 ENCOUNTER — Other Ambulatory Visit: Payer: Self-pay | Admitting: Cardiology

## 2020-11-14 ENCOUNTER — Telehealth: Payer: Self-pay

## 2020-11-14 NOTE — Telephone Encounter (Signed)
11/14/2020 '@2pm'$ : Palliative care SW outreached patient per Palliative care MD, Dr. Mariea Clonts request, to assess needs and discuss mental health a d dental services.  Call unsuccessful. SW LVM. Awaiting return call.    COMMUNITY RESOURCES: Patient can outreach the following for mental health and dental services: [facility]   Address: [address], [place], [place] [address]  Phone: [phone]   [facility] Phone:[phone] Address: [place], [place] [address]  [email or web address]   [phone]  Physical Address:[address], [address], [id number]

## 2020-11-26 ENCOUNTER — Other Ambulatory Visit: Payer: Self-pay | Admitting: Cardiology

## 2020-12-17 NOTE — Progress Notes (Signed)
Cardiology Office Note   Date:  12/19/2020   ID:  Nicole [name], DOB 09/28/1945, MRN [id number]  PCP:  [name], MD  Cardiologist:   Wilfred Siverson Martinique, MD   Chief Complaint  Patient presents with   Congestive Heart Failure        History of Present Illness: Nicole [name] is a 75 y.o. female who presents for follow up CHF. She has a PMH of coronary artery disease status post PCI, acute combined systolic and diastolic CHF, diabetes mellitus, depression, GERD, DOE, cardiomyopathy, and aphasia.    She was diagnosed with COVID-19 pneumonia 4/21.  She presented to the emergency department on 10/16/2019 with complaints of progressively worsening shortness of breath, DOE, and generalized weakness.  On arrival to the ED her high-sensitivity troponins were slightly elevated.  Her EKG showed sinus tachycardia with left atrial enlargement and possible lateral injury.  Her CXR showed cardiomegaly.  She was also noted to have some ST elevation in V3-V6 and LVH.  Her echocardiogram 8 /7 showed an EF of 20-25%,Global hypokinesis, G2 DD.  She underwent cardiac catheterization on 10/21/2019 which showed first marginal 95% stenosed with no other significant stenosis. OM was treated with DES.   She was managed medically with DAPT, metoprolol, diuresis, Entresto and Farxiga. Seen in office 8/31 with improvement.   She was admitted in early October 2021 with acute severe sepsis. Unknown source. Had a troponin leak felt to be due to demand ischemia. Repeat Echo showed EF 25-30%. Managed again medically. Entresto and Toprol doses increased.  Wilder Glade was held.  She was readmitted on November 1 with urosepsis. Treated with antibiotics. Had vertigo symptoms with negative cranial CT and MRI.   She was admitted from 6/19-6/22 with failure to thrive and AKI. and has done poorly since then with progressive weight loss. Her CHF meds were held due to hypotension and her creatinine normalized with hydration. Sent home  with palliative care follow up and meds were resumed at discharge.   Repeat Echo on 08/29/20 showed normalization of EF to 60-65%. Her heart medication was resumed.  On follow up today she is seen with her daughter. She reports her breathing is doing ok.   No edema. Weight goes up and down. No chest pain.   Past Medical History:  Diagnosis Date   Anemia    Anxiety    Back pain    CAD (coronary artery disease) 10/22/2019   DES to [facility] August 2021   Colon polyps    COVID-28 June 2019   Endometriosis    Fibromyalgia    GERD with esophagitis    Follows with Dr. 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 mellitus ([place])    Type 2 diabetes mellitus ([place])    Urinary incontinence     Past Surgical History:  Procedure Laterality Date   ABDOMINAL HYSTERECTOMY     TAH BSO   APPENDECTOMY  1999   CATARACT EXTRACTION     both   CHOLECYSTECTOMY     CORONARY STENT INTERVENTION N/A 10/21/2019   Procedure: CORONARY STENT INTERVENTION;  Surgeon: Troy Sine, MD;  Location: [facility];  Service: Cardiovascular;  Laterality: N/A;   DENTAL SURGERY     HERNIA REPAIR  2000   LAPAROSCOPIC ENDOMETRIOSIS FULGURATION  RIGHT/LEFT HEART CATH AND CORONARY ANGIOGRAPHY N/A 10/21/2019   Procedure: RIGHT/LEFT HEART CATH AND CORONARY ANGIOGRAPHY;  Surgeon: Troy Sine, MD;  Location: [facility];  Service: Cardiovascular;  Laterality: N/A;     Current Outpatient Medications  Medication Sig Dispense Refill   aspirin 81 MG tablet Take 81 mg by mouth daily.     DULoxetine (CYMBALTA) 60 MG capsule Take 60 mg by mouth daily.     ENTRESTO 97-103 MG TAKE 1 TABLET BY MOUTH TWICE DAILY 60 tablet 5   ergocalciferol (VITAMIN D2) 50000 UNITS capsule Take 50,000 Units by mouth once a week.     fluticasone (FLONASE) 50 MCG/ACT nasal  spray Place 1 spray into both nostrils daily as needed for allergies or rhinitis.      Fluticasone-Salmeterol (ADVAIR) 250-50 MCG/DOSE AEPB Inhale 1 puff into the lungs 2 (two) times daily.     furosemide (LASIX) 40 MG tablet Take 0.5 tablets (20 mg total) by mouth daily as needed for edema or fluid (Please take 20mg  Daily as needed for swelling, or weight gain.). 90 tablet 3   JANUMET 50-500 MG tablet Take 1 tablet by mouth daily.     loratadine (CLARITIN) 10 MG tablet Take 1 tablet (10 mg total) by mouth daily. 30 tablet 0   LORazepam (ATIVAN) 1 MG tablet Take 1 tablet (1 mg total) by mouth 2 (two) times daily. 3 tablet 0   meclizine (ANTIVERT) 25 MG tablet Take 25 mg by mouth daily as needed for dizziness.     metoprolol succinate (TOPROL-XL) 50 MG 24 hr tablet Take 1 tablet (50 mg total) by mouth daily. Take with or immediately following a meal. 90 tablet 3   ONETOUCH DELICA LANCETS 67E MISC      ONETOUCH VERIO test strip      oxyCODONE-acetaminophen (PERCOCET) 10-325 MG tablet Take 1 tablet by mouth 2 (two) times daily as needed for pain. 3 tablet 0   pantoprazole (PROTONIX) 40 MG tablet Take 40 mg by mouth 2 (two) times daily.     PROAIR HFA 108 (90 Base) MCG/ACT inhaler Inhale 2 puffs into the lungs every 4 (four) hours as needed for wheezing or shortness of breath. Uses as rescue inhaler Reported on 06/20/2015 6.7 g 0   promethazine (PHENERGAN) 12.5 MG tablet Take 1 tablet (12.5 mg total) by mouth every 6 (six) hours as needed for nausea. 20 tablet 0   traZODone (DESYREL) 50 MG tablet Take 50 mg by mouth at bedtime.     atorvastatin (LIPITOR) 80 MG tablet Take 1 tablet (80 mg total) by mouth at bedtime. 30 tablet 11   potassium chloride SA (KLOR-CON) 20 MEQ tablet Take 1 tablet (20 mEq total) by mouth daily. 30 tablet 2   No current facility-administered medications for this visit.    Allergies:   Fish-derived products, Shellfish allergy, Aspirin, Benadryl [diphenhydramine], Ivp dye  [iodinated diagnostic agents], and Latex    Social History:  The patient  reports that she has never smoked. She has never used smokeless tobacco. She reports that she does not currently use alcohol. She reports that she does not use drugs.   Family History:  The patient's family history includes Cancer in her brother, brother, paternal aunt, and paternal uncle; Diabetes in her daughter, father, and mother; Heart attack in her father; Heart failure in her father and mother; Hypertension in her father, maternal grandfather, maternal grandmother, mother, paternal grandfather, and paternal grandmother; Stroke in her father; Thyroid disease in  her brother.    ROS:  Please see the history of present illness.   Otherwise, review of systems are positive for none.   All other systems are reviewed and negative.    PHYSICAL EXAM: VS:  BP 120/70 (BP Location: Left Arm)   Pulse 81   Ht 5\' 2"  (1.575 m)   Wt 167 lb 3.2 oz (75.8 kg)   SpO2 99%   BMI 30.58 kg/m  , BMI Body mass index is 30.58 kg/m. GEN: Well nourished, well developed, in no acute distress  HEENT: normal  Neck: no JVD, carotid bruits, or masses Cardiac: RRR; no murmurs, rubs, or gallops,no edema  Respiratory:  clear to auscultation bilaterally, normal work of breathing GI: soft, nontender, nondistended, + BS MS: no deformity or atrophy  Skin: warm and dry, no rash Neuro:  Strength and sensation are intact Psych: euthymic mood, full affect   EKG:  EKG is not ordered today. The ekg ordered today demonstrates N/A   Recent Labs: 01/10/2020: B Natriuretic Peptide 57.1 01/11/2020: TSH 0.945 08/27/2020: ALT 24 08/29/2020: Hemoglobin 9.6; Magnesium 1.8; Platelets 170 08/30/2020: BUN 20; Creatinine, Ser 0.88; Potassium 3.7; Sodium 141    Lipid Panel    Component Value Date/Time   CHOL 68 12/16/2019 0237   TRIG 123 12/16/2019 0237   HDL 22 (L) 12/16/2019 0237   CHOLHDL 3.1 12/16/2019 0237   VLDL 25 12/16/2019 0237   LDLCALC 21  12/16/2019 0237      Wt Readings from Last 3 Encounters:  12/19/20 167 lb 3.2 oz (75.8 kg)  09/04/20 163 lb (73.9 kg)  08/30/20 168 lb 1.6 oz (76.2 kg)    Labs dated 09/14/20: BUn 28, creatinine 1.5. cholesterol 124, triglycerides 144, HDL 33, LDL 57. A1c 7%. Otherwise CMET normal.  Other studies Reviewed: Additional studies/ records that were reviewed today include:   Cardiac cath/PCI 10/21/19:  CORONARY STENT INTERVENTION  RIGHT/LEFT HEART CATH AND CORONARY ANGIOGRAPHY  Conclusion    1st Mrg lesion is 95% stenosed. Post intervention, there is a 0% residual stenosis. Prox RCA to Mid RCA lesion is 20% stenosed. Prox LAD lesion is 20% stenosed. Mid LAD lesion is 40% stenosed. A stent was successfully placed.   Coronary obstructive disease with 20% proximal and 40% smooth mid LAD stenoses; 30% proximal circumflex stenosis with 95% near ostial stenosis in a bifurcating OM1 vessel; and 20% mid RCA narrowing.   Normal to minimally increased right heart pressures; mean PA pressure 21 mmHg   Successful PCI to 95% very proximal circumflex marginal stenosis with insertion of a 2.25 x 8 mm DES stent with the stenosis being reduced to 0%.   RECOMMENDATION: Recommend DAPT for 12 months.  Guideline directed medical therapy for the patient's significant cardiomyopathy which is out of proportion to her high-grade circumflex marginal stenosis.  Aggressive lipid-lowering therapy with target LDL less than 70.  Echo 12/13/19: IMPRESSIONS     1. EF similar to August diffuse hypokinesis worse in the septum , apex  and inferior walls Basal lateral wall only area with preserved function .  Left ventricular ejection fraction, by estimation, is 25 to 30%. The left  ventricle has severely decreased  function. The left ventricle demonstrates regional wall motion  abnormalities (see scoring diagram/findings for description). The left  ventricular internal cavity size was moderately dilated. Left  ventricular  diastolic parameters were normal.   2. Right ventricular systolic function was not well visualized. The right  ventricular size is not well visualized. There is  mildly elevated  pulmonary artery systolic pressure.   3. The mitral valve is normal in structure. Trivial mitral valve  regurgitation. No evidence of mitral stenosis.   4. Tricuspid valve regurgitation is moderate.   5. The aortic valve is normal in structure. Aortic valve regurgitation is  not visualized. Mild to moderate aortic valve sclerosis/calcification is  present, without any evidence of aortic stenosis.   6. The inferior vena cava is normal in size with greater than 50%  respiratory variability, suggesting right atrial pressure of 3 mmHg.   Echo 08/29/20: IMPRESSIONS     1. Left ventricular ejection fraction, by estimation, is 60 to 65%. The  left ventricle has normal function. The left ventricle has no regional  wall motion abnormalities. Left ventricular diastolic parameters are  consistent with Grade I diastolic  dysfunction (impaired relaxation). Elevated left atrial pressure.   2. Right ventricular systolic function is normal. The right ventricular  size is normal. There is normal pulmonary artery systolic pressure.   3. The mitral valve is normal in structure. Trivial mitral valve  regurgitation. No evidence of mitral stenosis.   4. The aortic valve is tricuspid. Aortic valve regurgitation is not  visualized. Mild aortic valve sclerosis is present, with no evidence of  aortic valve stenosis.   5. The inferior vena cava is normal in size with greater than 50%  respiratory variability, suggesting right atrial pressure of 3 mmHg.   ASSESSMENT AND PLAN:  1. Chronic combined systolic/diastolic CHF-EF 51-70% initially. nonischemic.  Underwent cardiac catheterization 10/21/2019 which showed 95% OM lesion was treated with PCI/DES x1, 20% medial RCA, 20% proximal LAD had 40% medial LAD.  It was felt that  her cardiomyopathy was out of proportion to her  OM stenosis.  Persistent low EF by Echo in October but had recurrent urosepsis then.  On recent hospital stay EF had normalized to 60-65%.  Given AKI aldactone was stopped and she is using lasix only PRN.  Continue Entresto and Toprol XL   2. Coronary artery disease-s/p DES of OM 1 in August. She is asymptomatic.  Continue aspirin  continue metoprolol, atorvastatin   3. Essential hypertension-BP well controlled. Continue metoprolol, entresto    4. Type 2 diabetes-A1c 7.0  Followed by PCP   5. Recurrent urosepsis.  Avoid SGLT 2 inhibitors.   6. S/p Covid 9 infection April 2021   7. AKI. Off diuretics as noted above. If Renal function worsens may need to stop/reduce  Entresto as well.   Current medicines are reviewed at length with the patient today.  The patient does not have concerns regarding medicines.  The following changes have been made:  See above  Labs/ tests ordered today include:   No orders of the defined types were placed in this encounter.    Disposition:   FU with me in 6 months  Signed, Dennison Mcdaid [place], MD  12/19/2020 2:16 PM    [facility] [address] [place], [place], [address] Phone [phone], Fax [phone]

## 2020-12-19 ENCOUNTER — Ambulatory Visit: Payer: Medicare Other | Admitting: Cardiology

## 2020-12-19 ENCOUNTER — Encounter: Payer: Self-pay | Admitting: Cardiology

## 2020-12-19 ENCOUNTER — Other Ambulatory Visit: Payer: Self-pay

## 2020-12-19 VITALS — BP 120/70 | HR 81 | Ht 62.0 in | Wt 167.2 lb

## 2020-12-19 DIAGNOSIS — I10 Essential (primary) hypertension: Secondary | ICD-10-CM

## 2020-12-19 DIAGNOSIS — I5022 Chronic systolic (congestive) heart failure: Secondary | ICD-10-CM | POA: Diagnosis not present

## 2020-12-19 DIAGNOSIS — I2510 Atherosclerotic heart disease of native coronary artery without angina pectoris: Secondary | ICD-10-CM

## 2020-12-19 DIAGNOSIS — I5041 Acute combined systolic (congestive) and diastolic (congestive) heart failure: Secondary | ICD-10-CM

## 2020-12-28 ENCOUNTER — Other Ambulatory Visit: Payer: Self-pay | Admitting: Cardiology

## 2021-01-24 ENCOUNTER — Other Ambulatory Visit: Payer: Self-pay | Admitting: Cardiology

## 2021-02-12 ENCOUNTER — Other Ambulatory Visit: Payer: Self-pay | Admitting: Endocrinology

## 2021-02-12 DIAGNOSIS — Z1231 Encounter for screening mammogram for malignant neoplasm of breast: Secondary | ICD-10-CM

## 2021-02-19 ENCOUNTER — Encounter: Payer: Self-pay | Admitting: Neurology

## 2021-02-23 ENCOUNTER — Other Ambulatory Visit: Payer: Self-pay | Admitting: Cardiology
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
## 2021-03-02 ENCOUNTER — Ambulatory Visit: Payer: Medicare Other | Admitting: Neurology

## 2021-03-02 DIAGNOSIS — D509 Iron deficiency anemia, unspecified: Secondary | ICD-10-CM | POA: Insufficient documentation

## 2021-03-02 DIAGNOSIS — I255 Ischemic cardiomyopathy: Secondary | ICD-10-CM | POA: Insufficient documentation

## 2021-03-02 DIAGNOSIS — R269 Unspecified abnormalities of gait and mobility: Secondary | ICD-10-CM | POA: Insufficient documentation

## 2021-03-11 ENCOUNTER — Emergency Department (HOSPITAL_COMMUNITY): Payer: PPO

## 2021-03-11 ENCOUNTER — Other Ambulatory Visit: Payer: Self-pay

## 2021-03-11 ENCOUNTER — Inpatient Hospital Stay (HOSPITAL_COMMUNITY)
Admission: EM | Admit: 2021-03-11 | Discharge: 2021-03-16 | DRG: 177 | Disposition: A | Discharge status: Home Health | Payer: PPO | Attending: Internal Medicine | Admitting: Internal Medicine

## 2021-03-11 ENCOUNTER — Encounter (HOSPITAL_COMMUNITY): Payer: Self-pay

## 2021-03-11 DIAGNOSIS — U071 COVID-19: Secondary | ICD-10-CM | POA: Diagnosis not present

## 2021-03-11 DIAGNOSIS — R0602 Shortness of breath: Secondary | ICD-10-CM

## 2021-03-11 DIAGNOSIS — Z66 Do not resuscitate: Secondary | ICD-10-CM | POA: Diagnosis not present

## 2021-03-11 DIAGNOSIS — Z90710 Acquired absence of both cervix and uterus: Secondary | ICD-10-CM | POA: Diagnosis not present

## 2021-03-11 DIAGNOSIS — K2100 Gastro-esophageal reflux disease with esophagitis, without bleeding: Secondary | ICD-10-CM | POA: Diagnosis present

## 2021-03-11 DIAGNOSIS — Z7984 Long term (current) use of oral hypoglycemic drugs: Secondary | ICD-10-CM

## 2021-03-11 DIAGNOSIS — F329 Major depressive disorder, single episode, unspecified: Secondary | ICD-10-CM | POA: Diagnosis present

## 2021-03-11 DIAGNOSIS — R6889 Other general symptoms and signs: Secondary | ICD-10-CM | POA: Diagnosis not present

## 2021-03-11 DIAGNOSIS — I110 Hypertensive heart disease with heart failure: Secondary | ICD-10-CM | POA: Diagnosis not present

## 2021-03-11 DIAGNOSIS — J1282 Pneumonia due to coronavirus disease 2019: Secondary | ICD-10-CM | POA: Diagnosis not present

## 2021-03-11 DIAGNOSIS — I5032 Chronic diastolic (congestive) heart failure: Secondary | ICD-10-CM | POA: Diagnosis not present

## 2021-03-11 DIAGNOSIS — Z7951 Long term (current) use of inhaled steroids: Secondary | ICD-10-CM

## 2021-03-11 DIAGNOSIS — Z808 Family history of malignant neoplasm of other organs or systems: Secondary | ICD-10-CM

## 2021-03-11 DIAGNOSIS — M797 Fibromyalgia: Secondary | ICD-10-CM | POA: Diagnosis present

## 2021-03-11 DIAGNOSIS — Z823 Family history of stroke: Secondary | ICD-10-CM

## 2021-03-11 DIAGNOSIS — Z888 Allergy status to other drugs, medicaments and biological substances status: Secondary | ICD-10-CM

## 2021-03-11 DIAGNOSIS — R531 Weakness: Secondary | ICD-10-CM | POA: Diagnosis not present

## 2021-03-11 DIAGNOSIS — E782 Mixed hyperlipidemia: Secondary | ICD-10-CM | POA: Diagnosis present

## 2021-03-11 DIAGNOSIS — D638 Anemia in other chronic diseases classified elsewhere: Secondary | ICD-10-CM | POA: Diagnosis not present

## 2021-03-11 DIAGNOSIS — I2510 Atherosclerotic heart disease of native coronary artery without angina pectoris: Secondary | ICD-10-CM | POA: Diagnosis present

## 2021-03-11 DIAGNOSIS — Z86010 Personal history of colonic polyps: Secondary | ICD-10-CM

## 2021-03-11 DIAGNOSIS — R0600 Dyspnea, unspecified: Principal | ICD-10-CM

## 2021-03-11 DIAGNOSIS — Z801 Family history of malignant neoplasm of trachea, bronchus and lung: Secondary | ICD-10-CM | POA: Diagnosis not present

## 2021-03-11 DIAGNOSIS — Z955 Presence of coronary angioplasty implant and graft: Secondary | ICD-10-CM

## 2021-03-11 DIAGNOSIS — K449 Diaphragmatic hernia without obstruction or gangrene: Secondary | ICD-10-CM | POA: Diagnosis not present

## 2021-03-11 DIAGNOSIS — E1169 Type 2 diabetes mellitus with other specified complication: Secondary | ICD-10-CM | POA: Diagnosis present

## 2021-03-11 DIAGNOSIS — R0902 Hypoxemia: Secondary | ICD-10-CM | POA: Diagnosis not present

## 2021-03-11 DIAGNOSIS — Z833 Family history of diabetes mellitus: Secondary | ICD-10-CM | POA: Diagnosis not present

## 2021-03-11 DIAGNOSIS — J4521 Mild intermittent asthma with (acute) exacerbation: Secondary | ICD-10-CM | POA: Diagnosis not present

## 2021-03-11 DIAGNOSIS — F419 Anxiety disorder, unspecified: Secondary | ICD-10-CM | POA: Diagnosis present

## 2021-03-11 DIAGNOSIS — E876 Hypokalemia: Secondary | ICD-10-CM | POA: Diagnosis not present

## 2021-03-11 DIAGNOSIS — Z8249 Family history of ischemic heart disease and other diseases of the circulatory system: Secondary | ICD-10-CM

## 2021-03-11 DIAGNOSIS — J45901 Unspecified asthma with (acute) exacerbation: Secondary | ICD-10-CM | POA: Diagnosis not present

## 2021-03-11 DIAGNOSIS — Z79899 Other long term (current) drug therapy: Secondary | ICD-10-CM

## 2021-03-11 DIAGNOSIS — Z8349 Family history of other endocrine, nutritional and metabolic diseases: Secondary | ICD-10-CM | POA: Diagnosis not present

## 2021-03-11 DIAGNOSIS — R079 Chest pain, unspecified: Secondary | ICD-10-CM | POA: Diagnosis not present

## 2021-03-11 DIAGNOSIS — R404 Transient alteration of awareness: Secondary | ICD-10-CM | POA: Diagnosis not present

## 2021-03-11 DIAGNOSIS — I499 Cardiac arrhythmia, unspecified: Secondary | ICD-10-CM | POA: Diagnosis not present

## 2021-03-11 DIAGNOSIS — R519 Headache, unspecified: Secondary | ICD-10-CM | POA: Diagnosis present

## 2021-03-11 DIAGNOSIS — Z743 Need for continuous supervision: Secondary | ICD-10-CM | POA: Diagnosis not present

## 2021-03-11 DIAGNOSIS — Z91040 Latex allergy status: Secondary | ICD-10-CM

## 2021-03-11 LAB — COMPREHENSIVE METABOLIC PANEL
ALT: 18 U/L (ref 0–44)
AST: 19 U/L (ref 15–41)
Albumin: 3.2 g/dL — ABNORMAL LOW (ref 3.5–5.0)
Alkaline Phosphatase: 77 U/L (ref 38–126)
Anion gap: 12 (ref 5–15)
BUN: 7 mg/dL — ABNORMAL LOW (ref 8–23)
CO2: 25 mmol/L (ref 22–32)
Calcium: 7.7 mg/dL — ABNORMAL LOW (ref 8.9–10.3)
Chloride: 105 mmol/L (ref 98–111)
Creatinine, Ser: 0.92 mg/dL (ref 0.44–1.00)
GFR, Estimated: >60 mL/min (ref >60)
Glucose, Bld: 152 mg/dL — ABNORMAL HIGH (ref 70–99)
Potassium: 3 mmol/L — ABNORMAL LOW (ref 3.5–5.1)
Sodium: 142 mmol/L (ref 135–145)
Total Bilirubin: 0.8 mg/dL (ref 0.3–1.2)
Total Protein: 7 g/dL (ref 6.5–8.1)

## 2021-03-11 LAB — LACTIC ACID, PLASMA
Lactic Acid, Venous: 1.1 mmol/L (ref 0.5–1.9)
Lactic Acid, Venous: 0.9 mmol/L (ref 0.5–1.9)

## 2021-03-11 LAB — HEMOGLOBIN A1C
Hgb A1c MFr Bld: 6.2 % — ABNORMAL HIGH (ref 4.8–5.6)
Mean Plasma Glucose: 131.24 mg/dL

## 2021-03-11 LAB — TYPE AND SCREEN
ABO/RH(D): O POS
Antibody Screen: NEGATIVE

## 2021-03-11 LAB — CBC WITH DIFFERENTIAL/PLATELET
Abs Immature Granulocytes: 0.08 10*3/uL — ABNORMAL HIGH (ref 0.00–0.07)
Basophils Absolute: 0 10*3/uL (ref 0.0–0.1)
Basophils Relative: 0 %
Eosinophils Absolute: 0 10*3/uL (ref 0.0–0.5)
Eosinophils Relative: 0 %
HCT: 34.8 % — ABNORMAL LOW (ref 36.0–46.0)
Hemoglobin: 11.9 g/dL — ABNORMAL LOW (ref 12.0–15.0)
Immature Granulocytes: 1 %
Lymphocytes Relative: 19 %
Lymphs Abs: 1.7 10*3/uL (ref 0.7–4.0)
MCH: 29.9 pg (ref 26.0–34.0)
MCHC: 34.2 g/dL (ref 30.0–36.0)
MCV: 87.4 fL (ref 80.0–100.0)
Monocytes Absolute: 0.6 10*3/uL (ref 0.1–1.0)
Monocytes Relative: 6 %
Neutro Abs: 6.7 10*3/uL (ref 1.7–7.7)
Neutrophils Relative %: 74 %
Platelets: 242 10*3/uL (ref 150–400)
RBC: 3.98 MIL/uL (ref 3.87–5.11)
RDW: 13.1 % (ref 11.5–15.5)
WBC: 9.1 10*3/uL (ref 4.0–10.5)
nRBC: 0 % (ref 0.0–0.2)

## 2021-03-11 LAB — D-DIMER, QUANTITATIVE: D-Dimer, Quant: 0.94 ug/mL-FEU — ABNORMAL HIGH (ref 0.00–0.50)

## 2021-03-11 LAB — FERRITIN: Ferritin: 96 ng/mL (ref 11–307)

## 2021-03-11 LAB — PROTIME-INR
INR: 1 (ref 0.8–1.2)
Prothrombin Time: 13.4 seconds (ref 11.4–15.2)

## 2021-03-11 LAB — FIBRINOGEN: Fibrinogen: 638 mg/dL — ABNORMAL HIGH (ref 210–475)

## 2021-03-11 LAB — RESP PANEL BY RT-PCR (FLU A&B, COVID) ARPGX2
Influenza A by PCR: NEGATIVE
Influenza B by PCR: NEGATIVE
SARS Coronavirus 2 by RT PCR: POSITIVE — AB

## 2021-03-11 LAB — TROPONIN I (HIGH SENSITIVITY)
Troponin I (High Sensitivity): 5 ng/L (ref <18)
Troponin I (High Sensitivity): 5 ng/L (ref <18)

## 2021-03-11 LAB — MAGNESIUM: Magnesium: <0.5 mg/dL — CL (ref 1.7–2.4)

## 2021-03-11 LAB — CBG MONITORING, ED: Glucose-Capillary: 129 mg/dL — ABNORMAL HIGH (ref 70–99)

## 2021-03-11 LAB — LACTATE DEHYDROGENASE: LDH: 144 U/L (ref 98–192)

## 2021-03-11 LAB — C-REACTIVE PROTEIN: CRP: 4.7 mg/dL — ABNORMAL HIGH (ref <1.0)

## 2021-03-11 LAB — BRAIN NATRIURETIC PEPTIDE
B Natriuretic Peptide: 47.1 pg/mL (ref 0.0–100.0)
B Natriuretic Peptide: 42.2 pg/mL (ref 0.0–100.0)

## 2021-03-11 LAB — PROCALCITONIN: Procalcitonin: <0.1 ng/mL

## 2021-03-11 MED ORDER — INSULIN ASPART 100 UNIT/ML IJ SOLN
0.0000 [IU] | Freq: Three times a day (TID) | INTRAMUSCULAR | Status: DC
  Administered 2021-03-12 – 2021-03-14 (×5): 3 [IU] via SUBCUTANEOUS
  Administered 2021-03-14: 2 [IU] via SUBCUTANEOUS
  Administered 2021-03-15: 3 [IU] via SUBCUTANEOUS
  Administered 2021-03-15: 2 [IU] via SUBCUTANEOUS
  Administered 2021-03-15 – 2021-03-16 (×3): 3 [IU] via SUBCUTANEOUS

## 2021-03-11 MED ORDER — POTASSIUM CHLORIDE CRYS ER 20 MEQ PO TBCR
40.0000 meq | EXTENDED_RELEASE_TABLET | Freq: Every day | ORAL | Status: DC
  Administered 2021-03-11: 40 meq via ORAL
  Filled 2021-03-11: qty 2

## 2021-03-11 MED ORDER — DEXAMETHASONE SODIUM PHOSPHATE 10 MG/ML IJ SOLN
6.0000 mg | INTRAMUSCULAR | Status: DC
  Administered 2021-03-11: 6 mg via INTRAVENOUS
  Filled 2021-03-11: qty 1

## 2021-03-11 MED ORDER — DULOXETINE HCL 60 MG PO CPEP
60.0000 mg | ORAL_CAPSULE | Freq: Every day | ORAL | Status: DC
  Administered 2021-03-11 – 2021-03-16 (×6): 60 mg via ORAL
  Filled 2021-03-11 (×6): qty 1

## 2021-03-11 MED ORDER — OXYCODONE-ACETAMINOPHEN 10-325 MG PO TABS: 1.0000 | ORAL_TABLET | Freq: Two times a day (BID) | ORAL | Status: DC | PRN

## 2021-03-11 MED ORDER — LINAGLIPTIN 5 MG PO TABS
5.0000 mg | ORAL_TABLET | Freq: Every day | ORAL | Status: DC
  Administered 2021-03-11 – 2021-03-16 (×6): 5 mg via ORAL
  Filled 2021-03-11 (×6): qty 1

## 2021-03-11 MED ORDER — ENOXAPARIN SODIUM 40 MG/0.4ML IJ SOSY
40.0000 mg | PREFILLED_SYRINGE | INTRAMUSCULAR | Status: DC
  Administered 2021-03-11 – 2021-03-15 (×5): 40 mg via SUBCUTANEOUS
  Filled 2021-03-11 (×5): qty 0.4

## 2021-03-11 MED ORDER — LORATADINE 10 MG PO TABS
10.0000 mg | ORAL_TABLET | Freq: Every day | ORAL | Status: DC
  Administered 2021-03-11 – 2021-03-16 (×6): 10 mg via ORAL
  Filled 2021-03-11 (×6): qty 1

## 2021-03-11 MED ORDER — MECLIZINE HCL 25 MG PO TABS
25.0000 mg | ORAL_TABLET | Freq: Every day | ORAL | Status: DC | PRN
  Administered 2021-03-14 – 2021-03-16 (×3): 25 mg via ORAL
  Filled 2021-03-11 (×5): qty 1

## 2021-03-11 MED ORDER — ONDANSETRON HCL 4 MG/2ML IJ SOLN: 4.0000 mg | Freq: Four times a day (QID) | INTRAMUSCULAR | Status: DC | PRN

## 2021-03-11 MED ORDER — OXYCODONE HCL 5 MG PO TABS
5.0000 mg | ORAL_TABLET | Freq: Two times a day (BID) | ORAL | Status: DC | PRN
  Administered 2021-03-12: 5 mg via ORAL
  Filled 2021-03-11: qty 1

## 2021-03-11 MED ORDER — VITAMIN D (ERGOCALCIFEROL) 1.25 MG (50000 UNIT) PO CAPS
50000.0000 [IU] | ORAL_CAPSULE | ORAL | Status: DC
  Administered 2021-03-12: 50000 [IU] via ORAL
  Filled 2021-03-11 (×2): qty 1

## 2021-03-11 MED ORDER — FLUTICASONE PROPIONATE 50 MCG/ACT NA SUSP: 1.0000 | Freq: Every day | NASAL | Status: DC | PRN

## 2021-03-11 MED ORDER — SODIUM CHLORIDE 0.9 % IV SOLN
200.0000 mg | Freq: Once | INTRAVENOUS | Status: AC
  Administered 2021-03-11: 200 mg via INTRAVENOUS
  Filled 2021-03-11: qty 40

## 2021-03-11 MED ORDER — ALBUTEROL SULFATE HFA 108 (90 BASE) MCG/ACT IN AERS
2.0000 | INHALATION_SPRAY | Freq: Four times a day (QID) | RESPIRATORY_TRACT | Status: DC
  Administered 2021-03-11 – 2021-03-16 (×18): 2 via RESPIRATORY_TRACT
  Filled 2021-03-11: qty 6.7

## 2021-03-11 MED ORDER — ATORVASTATIN CALCIUM 80 MG PO TABS
80.0000 mg | ORAL_TABLET | Freq: Every day | ORAL | Status: DC
  Administered 2021-03-11 – 2021-03-16 (×6): 80 mg via ORAL
  Filled 2021-03-11 (×6): qty 1

## 2021-03-11 MED ORDER — SITAGLIPTIN PHOS-METFORMIN HCL 50-500 MG PO TABS: 1.0000 | ORAL_TABLET | Freq: Every day | ORAL | Status: DC

## 2021-03-11 MED ORDER — SODIUM CHLORIDE 0.9 % IV SOLN
100.0000 mg | Freq: Every day | INTRAVENOUS | Status: AC
  Administered 2021-03-12 – 2021-03-15 (×4): 100 mg via INTRAVENOUS
  Filled 2021-03-11 (×4): qty 20

## 2021-03-11 MED ORDER — ASPIRIN 81 MG PO CHEW
81.0000 mg | CHEWABLE_TABLET | Freq: Every day | ORAL | Status: DC
Start: 2021-03-11 — End: 2021-03-16
  Administered 2021-03-11 – 2021-03-16 (×6): 81 mg via ORAL
  Filled 2021-03-11 (×6): qty 1

## 2021-03-11 MED ORDER — ONDANSETRON HCL 4 MG/2ML IJ SOLN
4.0000 mg | Freq: Once | INTRAMUSCULAR | Status: AC
  Administered 2021-03-11: 4 mg via INTRAVENOUS
  Filled 2021-03-11: qty 2

## 2021-03-11 MED ORDER — TRAZODONE HCL 50 MG PO TABS
50.0000 mg | ORAL_TABLET | Freq: Every day | ORAL | Status: DC
  Administered 2021-03-11 – 2021-03-15 (×5): 50 mg via ORAL
  Filled 2021-03-11 (×5): qty 1

## 2021-03-11 MED ORDER — METOPROLOL TARTRATE 25 MG PO TABS
25.0000 mg | ORAL_TABLET | Freq: Two times a day (BID) | ORAL | Status: DC
  Administered 2021-03-11 – 2021-03-16 (×10): 25 mg via ORAL
  Filled 2021-03-11 (×10): qty 1

## 2021-03-11 MED ORDER — METFORMIN HCL 500 MG PO TABS
500.0000 mg | ORAL_TABLET | Freq: Every day | ORAL | Status: DC
  Administered 2021-03-12: 500 mg via ORAL
  Filled 2021-03-11: qty 1

## 2021-03-11 MED ORDER — SACUBITRIL-VALSARTAN 97-103 MG PO TABS
1.0000 | ORAL_TABLET | Freq: Two times a day (BID) | ORAL | Status: DC
  Administered 2021-03-12 – 2021-03-16 (×10): 1 via ORAL
  Filled 2021-03-11 (×13): qty 1

## 2021-03-11 MED ORDER — PROMETHAZINE HCL 25 MG PO TABS: 12.5000 mg | ORAL_TABLET | Freq: Four times a day (QID) | ORAL | Status: DC | PRN

## 2021-03-11 MED ORDER — OXYCODONE-ACETAMINOPHEN 5-325 MG PO TABS
1.0000 | ORAL_TABLET | Freq: Two times a day (BID) | ORAL | Status: DC | PRN
  Administered 2021-03-12 – 2021-03-16 (×4): 1 via ORAL
  Filled 2021-03-11 (×4): qty 1

## 2021-03-11 MED ORDER — ONDANSETRON HCL 4 MG PO TABS
4.0000 mg | ORAL_TABLET | Freq: Four times a day (QID) | ORAL | Status: DC | PRN
  Administered 2021-03-13: 4 mg via ORAL
  Filled 2021-03-11: qty 1

## 2021-03-11 MED ORDER — LORAZEPAM 1 MG PO TABS
1.0000 mg | ORAL_TABLET | Freq: Two times a day (BID) | ORAL | Status: DC
  Administered 2021-03-11 – 2021-03-16 (×10): 1 mg via ORAL
  Filled 2021-03-11 (×10): qty 1

## 2021-03-11 MED ORDER — MAGNESIUM SULFATE 2 GM/50ML IV SOLN
2.0000 g | Freq: Once | INTRAVENOUS | Status: AC
  Administered 2021-03-11: 2 g via INTRAVENOUS
  Filled 2021-03-11: qty 50

## 2021-03-11 MED ORDER — ALBUTEROL SULFATE HFA 108 (90 BASE) MCG/ACT IN AERS
2.0000 | INHALATION_SPRAY | RESPIRATORY_TRACT | Status: DC | PRN
  Administered 2021-03-14: 2 via RESPIRATORY_TRACT
  Filled 2021-03-11: qty 6.7

## 2021-03-11 MED ORDER — POTASSIUM CHLORIDE 10 MEQ/100ML IV SOLN
10.0000 meq | Freq: Once | INTRAVENOUS | Status: AC
  Administered 2021-03-11: 10 meq via INTRAVENOUS
  Filled 2021-03-11: qty 100

## 2021-03-11 MED ORDER — PANTOPRAZOLE SODIUM 40 MG PO TBEC
40.0000 mg | DELAYED_RELEASE_TABLET | Freq: Two times a day (BID) | ORAL | Status: DC
  Administered 2021-03-11 – 2021-03-16 (×10): 40 mg via ORAL
  Filled 2021-03-11 (×10): qty 1

## 2021-03-11 MED ORDER — MOMETASONE FURO-FORMOTEROL FUM 200-5 MCG/ACT IN AERO
2.0000 | INHALATION_SPRAY | Freq: Two times a day (BID) | RESPIRATORY_TRACT | Status: DC
  Administered 2021-03-12 – 2021-03-16 (×9): 2 via RESPIRATORY_TRACT
  Filled 2021-03-11: qty 8.8

## 2021-03-11 NOTE — ED Notes (Signed)
Pt having some tremors and reporting blurry/double vision - neuro intact at this time and pupils equal and reactive

## 2021-03-11 NOTE — ED Provider Notes (Signed)
[place] EMERGENCY DEPARTMENT Provider Note   CSN: [id number] Arrival date & time: 03/11/21  1438     History  Chief Complaint  Patient presents with   Shortness of Breath   Weakness    Nicole [name] is a 76 y.o. female.  76 year old female with prior medical history as detailed below presents for evaluation.  Patient reports 5 to 6 days of generalized fatigue with associated shortness of breath, subjective fevers, and poor p.o. intake.  Patient reports positive COVID test at home on Friday.  She presents today via EMS.  EMS reported significant desaturation on room air down to 80% with ambulation.  Patient is currently on 2 L nasal cannula and appears to be comfortable.  Room air sats are in the upper 90s.  Patient is reporting intermittent nausea.  EMS administered Zofran prior to arrival.  Patient reports prior episode of COVID infection approximately 1 year prior.  This required hospitalization.  She reports that she has never been vaccinated for COVID.  The history is provided by the patient, medical records and the EMS personnel.  Shortness of Breath Severity:  Moderate Onset quality:  Gradual Duration:  5 days Timing:  Constant Progression:  Worsening Chronicity:  New Weakness Associated symptoms: shortness of breath       Home Medications Prior to Admission medications   Medication Sig Start Date End Date Taking? Authorizing Provider  aspirin 81 MG tablet Take 81 mg by mouth daily.    [provider]  atorvastatin (LIPITOR) 80 MG tablet TAKE 1 TABLET(80 MG) BY MOUTH AT BEDTIME 02/26/21   Martinique, Zay Yeargan M, MD  DULoxetine (CYMBALTA) 60 MG capsule Take 60 mg by mouth daily. 08/10/19   [provider]  ENTRESTO 97-103 MG TAKE 1 TABLET BY MOUTH TWICE DAILY 10/30/20   [place], Roselle Norton M, MD  ergocalciferol (VITAMIN D2) 50000 UNITS capsule Take 50,000 Units by mouth once a week.    [provider]  fluticasone (FLONASE) 50 MCG/ACT  nasal spray Place 1 spray into both nostrils daily as needed for allergies or rhinitis.  05/28/19   [provider]  Fluticasone-Salmeterol (ADVAIR) 250-50 MCG/DOSE AEPB Inhale 1 puff into the lungs 2 (two) times daily.    [provider]  furosemide (LASIX) 40 MG tablet Take 0.5 tablets (20 mg total) by mouth daily as needed for edema or fluid (Please take 20mg  Daily as needed for swelling, or weight gain.). 09/21/20   [place], Marieme Mcmackin M, MD  JANUMET 50-500 MG tablet Take 1 tablet by mouth daily. 07/27/20   [provider]  loratadine (CLARITIN) 10 MG tablet Take 1 tablet (10 mg total) by mouth daily. 06/28/19   Ghimire, Henreitta Leber, MD  LORazepam (ATIVAN) 1 MG tablet Take 1 tablet (1 mg total) by mouth 2 (two) times daily. 12/18/19   Charlynne Cousins, MD  meclizine (ANTIVERT) 25 MG tablet Take 25 mg by mouth daily as needed for dizziness.    [provider]  metoprolol succinate (TOPROL-XL) 50 MG 24 hr tablet TAKE 1 TABLET(50 MG) BY MOUTH DAILY WITH OR IMMEDIATELY FOLLOWING A MEAL 01/24/21   Martin[place], Lytle Malburg M, MD  [facility] LANCETS [address] [place]  06/03/16   [provider]  [facility] VERIO test strip  06/03/16   [provider]  oxyCODONE-acetaminophen (PERCOCET) 10-325 MG tablet Take 1 tablet by mouth 2 (two) times daily as needed for pain. 12/18/19   Charlynne Cousins, MD  pantoprazole (PROTONIX) 40 MG tablet Take  40 mg by mouth 2 (two) times daily. 11/10/20   [provider]  potassium chloride SA (KLOR-CON) 20 MEQ tablet TAKE 2 TABLET BY MOUTH DAILY 01/24/21   [place], Kaylenn Civil M, MD  [facility] HFA [phone] Base) MCG/ACT inhaler Inhale 2 puffs into the lungs every 4 (four) hours as needed for wheezing or shortness of breath. Uses as rescue inhaler Reported on 06/20/2015 06/28/19   Jonetta Osgood, MD  promethazine (PHENERGAN) 12.5 MG tablet Take 1 tablet (12.5 mg total) by mouth every 6 (six) hours as needed for nausea. 01/15/20   Arrien, Jimmy Picket, MD  traZODone (DESYREL) 50 MG tablet Take 50 mg by mouth at bedtime. 12/15/20   [provider]      Allergies    Fish-derived products, Shellfish allergy, Aspirin, Benadryl [diphenhydramine], Ivp dye [iodinated contrast media], and Latex    Review of Systems   Review of Systems  Respiratory:  Positive for shortness of breath.   Neurological:  Positive for weakness.  All other systems reviewed and are negative.  Physical Exam Updated Vital Signs BP (!) 153/97 (BP Location: Right Arm)    Pulse 96    Temp 98.6 F (37 C) (Oral)    Resp 18    SpO2 97%  Physical Exam Vitals and nursing note reviewed.  Constitutional:      General: She is not in acute distress.    Appearance: Normal appearance. She is well-developed.  HENT:     Head: Normocephalic and atraumatic.  Eyes:     Conjunctiva/sclera: Conjunctivae normal.     Pupils: Pupils are equal, round, and reactive to light.  Cardiovascular:     Rate and Rhythm: Normal rate and regular rhythm.     Heart sounds: Normal heart sounds.  Pulmonary:     Effort: Pulmonary effort is normal. No respiratory distress.     Breath sounds: Examination of the right-lower field reveals decreased breath sounds. Examination of the left-lower field reveals decreased breath sounds. Decreased breath sounds present.  Abdominal:     General: There is no distension.     Palpations: Abdomen is soft.     Tenderness: There is no abdominal tenderness.  Musculoskeletal:        General: No deformity. Normal range of motion.     Cervical back: Normal range of motion and neck supple.  Skin:    General: Skin is warm and dry.  Neurological:     General: No focal deficit present.     Mental Status: She is alert and oriented to person, place, and time.    ED Results / Procedures / Treatments   Labs (all labs ordered are listed, but only abnormal results are displayed) Labs Reviewed  RESP PANEL BY RT-PCR (FLU A&B, COVID) ARPGX2 - Abnormal;  Notable for the following components:      Result Value   SARS Coronavirus 2 by RT PCR POSITIVE (*)    All other components within normal limits  CBC WITH DIFFERENTIAL/PLATELET - Abnormal; Notable for the following components:   Hemoglobin 11.9 (*)    HCT 34.8 (*)    Abs Immature Granulocytes 0.08 (*)    All other components within normal limits  COMPREHENSIVE METABOLIC PANEL - Abnormal; Notable for the following components:   Potassium 3.0 (*)    Glucose, Bld 152 (*)    BUN 7 (*)    Calcium 7.7 (*)    Albumin 3.2 (*)    All other components within normal limits  CULTURE, BLOOD (ROUTINE X 2)  CULTURE, BLOOD (ROUTINE X 2)  BRAIN NATRIURETIC PEPTIDE  PROTIME-INR  LACTIC ACID, PLASMA  URINALYSIS, ROUTINE W REFLEX MICROSCOPIC  LACTIC ACID, PLASMA  TYPE AND SCREEN  TROPONIN I (HIGH SENSITIVITY)  TROPONIN I (HIGH SENSITIVITY)    EKG None  Radiology No results found.  Procedures Procedures    Medications Ordered in ED Medications  ondansetron (ZOFRAN) injection 4 mg (has no administration in time range)    ED Course/ Medical Decision Making/ A&P                           Medical Decision Making  This patient presents to the ED for concern of dyspnea with reported concurrent COVID infection, this involves an extensive number of treatment options, and is a complaint that carries with it a high risk of complications and morbidity.  The differential diagnosis includes acute COVID infection, pneumonia, sepsis, dehydration, acute kidney injury, cardiac event, etc.   Co morbidities that complicate the patient evaluation  Cardiomyopathy, asthma, renal insufficiency   Additional history obtained:  External records from outside source obtained and reviewed including prior ED evaluations, prior inpatient evaluations, outpatient records.   Lab Tests:  I Ordered, and personally interpreted labs.  The pertinent results include: COVID-positive, hypokalemia, evidence of  possible early viral pneumonia on chest x-ray   Imaging Studies ordered:  I ordered imaging studies including chest x-ray I independently visualized and interpreted imaging which showed possible early viral pneumonia I agree with the radiologist interpretation   Cardiac Monitoring:  The patient was maintained on a cardiac monitor.  I personally viewed and interpreted the cardiac monitored which showed an underlying rhythm of: Sinus rhythm   Medicines ordered and prescription drug management:  I ordered medication including potassium for treatment of hypokalemia Reevaluation of the patient after these medicines showed that the patient stayed the same    Test Considered:  Patient's presentation is consistent with likely viral process -given reported symptoms and COVID-positive status. Patient unlikely to benefit from additional imaging at this time.     Consultations Obtained:  I requested consultation with the hospitalist service,  and discussed lab and imaging findings as well as pertinent plan.  Full services were case and will evaluate for admission.   Problem List / ED Course:  Dyspnea, COVID infection   Reevaluation:  After the interventions noted above, I reevaluated the patient and found that they have :stayed the same    Dispostion:  After consideration of the diagnostic results and the patients response to treatment, I feel that the patent would benefit from admission.    CRITICAL CARE Performed by: Valarie Merino   Total critical care time: 30 minutes  Critical care time was exclusive of separately billable procedures and treating other patients.  Critical care was necessary to treat or prevent imminent or life-threatening deterioration.  Critical care was time spent personally by me on the following activities: development of treatment plan with patient and/or surrogate as well as nursing, discussions with consultants, evaluation of patient's  response to treatment, examination of patient, obtaining history from patient or surrogate, ordering and performing treatments and interventions, ordering and review of laboratory studies, ordering and review of radiographic studies, pulse oximetry and re-evaluation of patient's condition.           Final Clinical Impression(s) / ED Diagnoses Final diagnoses:  Dyspnea, unspecified type  COVID-19    Rx / DC Orders ED  Discharge Orders     None         Valarie Merino, MD 03/11/21 [phone]

## 2021-03-11 NOTE — ED Triage Notes (Signed)
Pt with hx of asthma had positive home covid test on 12/30. Pt c/o generalized weakness, shob, chest pain, headache, sore throat, and decreased PO intake d/t n/v. 4 mg zofran given by EMS. SpO2 81% after walking to the EMS stretcher but is not hypoxic while at rest. Placed on 2L O2 by EMS.

## 2021-03-11 NOTE — ED Notes (Signed)
MD Howerter paged and notified of critical magnesium

## 2021-03-11 NOTE — H&P (Signed)
History and Physical    Nicole [name] ELF:[id number] DOB: 05/05/1945 DOA: 03/11/2021  PCP: [name], MD (Confirm with patient/family/NH records and if not entered, this has to be entered at [facility] point of entry) Patient coming from: Home  I have personally briefly reviewed patient's old medical records in [place]  Chief Complaint: Wheezing, shortness of breath  HPI: Nicole [name] is a 76 y.o. female with medical history significant of mild intermittent asthma, CAD with stenting, HTN/chronic diastolic CHF, IIDM, presented with new onset of shortness of breath breathing for 2 days.  Symptoms started 2 days ago with increasing shortness of breath and wheezing, no cough.  She also experienced episode of subjective fever and chills but she did not measure her temperature at home.  Earlier this week, she had 2 days of loose bowel meant and nauseous but symptoms subsided yesterday.  Denies any chest pain, no abdominal pain.  She did a COVID self test at home on Friday which was positive.  Today she felt minimal walking triggered significant shortness of breath.  She had only 1 dose of COVID vaccination 2 years ago and she had [place] pneumonia 2021.    ED Course: Patient was found tachypneic, borderline hypoxia, stabilized on 2 L.  COVID test positive  Chest x-ray showed interstitial prominence suspicious for viral pneumonia.  Review of Systems: As per HPI otherwise 14 point review of systems negative.    Past Medical History:  Diagnosis Date   Anemia    Anxiety    Back pain    CAD (coronary artery disease) 10/22/2019   DES to [facility] August 2021   Colon polyps    COVID-28 June 2019   Endometriosis    Fibromyalgia    GERD with esophagitis    Follows with Dr. 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 mellitus ([place])     Type 2 diabetes mellitus ([place])    Urinary incontinence     Past Surgical History:  Procedure Laterality Date   ABDOMINAL HYSTERECTOMY     TAH BSO   APPENDECTOMY  1999   CATARACT EXTRACTION     both   CHOLECYSTECTOMY     CORONARY STENT INTERVENTION N/A 10/21/2019   Procedure: CORONARY STENT INTERVENTION;  Surgeon: Troy Sine, MD;  Location: [place] CV LAB;  Service: Cardiovascular;  Laterality: N/A;   DENTAL SURGERY     HERNIA REPAIR  2000   LAPAROSCOPIC ENDOMETRIOSIS FULGURATION     RIGHT/LEFT HEART CATH AND CORONARY ANGIOGRAPHY N/A 10/21/2019   Procedure: RIGHT/LEFT HEART CATH AND CORONARY ANGIOGRAPHY;  Surgeon: Troy Sine, MD;  Location: [place] [facility];  Service: Cardiovascular;  Laterality: N/A;     reports that she has never smoked. She has never used smokeless tobacco. She reports that she does not currently use alcohol. She reports that she does not use drugs.  Allergies  Allergen Reactions   Fish-Derived Products Anaphylaxis   Shellfish Allergy Shortness Of Breath    In dye for scan.   Aspirin Nausea Only and Other (See Comments)    Pt stated it only gives her problems when she takes the high dose (325 mg)   Benadryl [Diphenhydramine]     "flips out with it. Eyes roll to the back of her head."  Ivp Dye [Iodinated Contrast Media]     Hx of ? anaphlaxis   Latex Other (See Comments)    Pt says she gets a "stinky infection"    Family History  Problem Relation Age of Onset   Hypertension Mother    Heart failure Mother    Diabetes Mother    Hypertension Father    Heart failure Father    Diabetes Father    Heart attack Father    Stroke Father    Cancer Brother        lung   Thyroid disease Brother    Cancer Brother        throat   Cancer Paternal Aunt        bone   Cancer Paternal Uncle        lung   Hypertension Maternal Grandmother    Hypertension Maternal Grandfather    Hypertension Paternal Grandmother    Hypertension Paternal  Grandfather    Diabetes Daughter    Colon cancer Neg Hx    Stomach cancer Neg Hx      Prior to Admission medications   Medication Sig Start Date End Date Taking? Authorizing Provider  aspirin 81 MG tablet Take 81 mg by mouth daily.   Yes [provider]  atorvastatin (LIPITOR) 80 MG tablet TAKE 1 TABLET(80 MG) BY MOUTH AT BEDTIME Patient taking differently: Take 80 mg by mouth at bedtime. 02/26/21  Yes Martinique, Peter M, MD  DULoxetine (CYMBALTA) 60 MG capsule Take 60 mg by mouth daily. 08/10/19  Yes [provider]  ENTRESTO 97-103 MG TAKE 1 TABLET BY MOUTH TWICE DAILY 10/30/20  Yes [place], Peter M, MD  fluticasone [facility]) 50 MCG/ACT nasal spray Place 1 spray into both nostrils daily as needed for allergies or rhinitis.  05/28/19  Yes [provider]  Fluticasone-Salmeterol (ADVAIR) 250-50 MCG/DOSE AEPB Inhale 1 puff into the lungs 2 (two) times daily.   Yes [provider]  furosemide (LASIX) 40 MG tablet Take 0.5 tablets (20 mg total) by mouth daily as needed for edema or fluid (Please take 20mg  Daily as needed for swelling, or weight gain.). 09/21/20  Yes [place], Peter M, MD  JANUMET 50-500 MG tablet Take 1 tablet by mouth daily. 07/27/20  Yes [provider]  loratadine (CLARITIN) 10 MG tablet Take 1 tablet (10 mg total) by mouth daily. 06/28/19  Yes Ghimire, Henreitta Leber, MD  LORazepam (ATIVAN) 1 MG tablet Take 1 tablet (1 mg total) by mouth 2 (two) times daily. 12/18/19  Yes Charlynne Cousins, MD  meclizine (ANTIVERT) 25 MG tablet Take 25 mg by mouth daily as needed for dizziness.   Yes [provider]  metoprolol succinate (TOPROL-XL) 50 MG 24 hr tablet TAKE 1 TABLET(50 MG) BY MOUTH DAILY WITH OR IMMEDIATELY FOLLOWING A MEAL 01/24/21  Yes [place], Peter M, MD  oxyCODONE-acetaminophen (PERCOCET) 10-325 MG tablet Take 1 tablet by mouth 2 (two) times daily as needed for pain. 12/18/19  Yes Charlynne Cousins, MD  pantoprazole (PROTONIX) 40 MG  tablet Take 40 mg by mouth 2 (two) times daily. 11/10/20  Yes [provider]  potassium chloride SA (KLOR-CON) 20 MEQ tablet TAKE 2 TABLET BY MOUTH DAILY 01/24/21  Yes Martin[place], Peter M, MD  [facility] HFA 108 [phone] Base) MCG/ACT inhaler Inhale 2 puffs into the lungs every 4 (four) hours as needed for wheezing or shortness of breath. Uses as rescue inhaler Reported on 06/20/2015 06/28/19  Yes Ghimire, Henreitta Leber, MD  promethazine (  PHENERGAN) 12.5 MG tablet Take 1 tablet (12.5 mg total) by mouth every 6 (six) hours as needed for nausea. 01/15/20  Yes Arrien, Jimmy Picket, MD  traZODone (DESYREL) 50 MG tablet Take 50 mg by mouth at bedtime. 12/15/20  Yes [provider]  ergocalciferol (VITAMIN D2) 50000 UNITS capsule Take 50,000 Units by mouth once a week.    [provider]  Jonetta Speak LANCETS [address]  06/03/16   [provider]  [facility] VERIO test strip  06/03/16   [provider]    Physical Exam: Vitals:   03/11/21 1448 03/11/21 1449 03/11/21 1637  BP:  (!) 153/97   Pulse: 96    Resp: 18    Temp: 98.6 F (37 C)    TempSrc: Oral    SpO2: 97%    Weight:   63.5 kg  Height:   5\' 2"  (1.575 m)    Constitutional: NAD, calm, comfortable Vitals:   03/11/21 1448 03/11/21 1449 03/11/21 1637  BP:  (!) 153/97   Pulse: 96    Resp: 18    Temp: 98.6 F (37 C)    TempSrc: Oral    SpO2: 97%    Weight:   63.5 kg  Height:   5\' 2"  (1.575 m)   Eyes: PERRL, lids and conjunctivae normal ENMT: Mucous membranes are moist. Posterior pharynx clear of any exudate or lesions.Normal dentition.  Neck: normal, supple, no masses, no thyromegaly Respiratory: Diminished breathing sound bilaterally, diffused wheezing, no crackles.  Increasing respiratory effort. No accessory muscle use.  Cardiovascular: Regular rate and rhythm, no murmurs / rubs / gallops. No extremity edema. 2+ pedal pulses. No carotid bruits.  Abdomen: no tenderness, no masses palpated. No  hepatosplenomegaly. Bowel sounds positive.  Musculoskeletal: no clubbing / cyanosis. No joint deformity upper and lower extremities. Good ROM, no contractures. Normal muscle tone.  Skin: no rashes, lesions, ulcers. No induration Neurologic: CN 2-12 grossly intact. Sensation intact, DTR normal. Strength 5/5 in all 4.  Psychiatric: Normal judgment and insight. Alert and oriented x 3. Normal mood.   (  Labs on Admission: I have personally reviewed following labs and imaging studies  CBC: Recent Labs  Lab 03/11/21 1518  WBC 9.1  NEUTROABS 6.7  HGB 11.9*  HCT 34.8*  MCV 87.4  PLT 277   Basic Metabolic Panel: Recent Labs  Lab 03/11/21 1518  NA 142  K 3.0*  CL 105  CO2 25  GLUCOSE 152*  BUN 7*  CREATININE 0.92  CALCIUM 7.7*   GFR: Estimated Creatinine Clearance: 47.7 mL/min (by C-G formula based on SCr of 0.92 mg/dL). Liver Function Tests: Recent Labs  Lab 03/11/21 1518  AST 19  ALT 18  ALKPHOS 77  BILITOT 0.8  PROT 7.0  ALBUMIN 3.2*   No results for input(s): LIPASE, AMYLASE in the last 168 hours. No results for input(s): AMMONIA in the last 168 hours. Coagulation Profile: Recent Labs  Lab 03/11/21 1518  INR 1.0   Cardiac Enzymes: No results for input(s): CKTOTAL, CKMB, CKMBINDEX, TROPONINI in the last 168 hours. BNP (last 3 results) No results for input(s): PROBNP in the last 8760 hours. HbA1C: No results for input(s): HGBA1C in the last 72 hours. CBG: No results for input(s): GLUCAP in the last 168 hours. Lipid Profile: No results for input(s): CHOL, HDL, LDLCALC, TRIG, CHOLHDL, LDLDIRECT in the last 72 hours. Thyroid Function Tests: No results for input(s): TSH, T4TOTAL, FREET4, T3FREE, THYROIDAB in the last 72 hours. Anemia Panel: No results  for input(s): VITAMINB12, FOLATE, FERRITIN, TIBC, IRON, RETICCTPCT in the last 72 hours. Urine analysis:    Component Value Date/Time   COLORURINE YELLOW 08/27/2020 0320   APPEARANCEUR CLEAR 08/27/2020 0320    LABSPEC 1.010 08/27/2020 0320   PHURINE 5.5 08/27/2020 0320   GLUCOSEU NEGATIVE 08/27/2020 0320   HGBUR NEGATIVE 08/27/2020 0320   BILIRUBINUR NEGATIVE 08/27/2020 0320   KETONESUR NEGATIVE 08/27/2020 0320   PROTEINUR NEGATIVE 08/27/2020 0320   UROBILINOGEN 0.2 02/23/2014 1528   NITRITE NEGATIVE 08/27/2020 0320   LEUKOCYTESUR NEGATIVE 08/27/2020 0320    Radiological Exams on Admission: DG Chest Port 1 View  Result Date: 03/11/2021 CLINICAL DATA:  Dyspnea. Positive home COVID test 2 days ago. Weakness and shortness of breath. Chest pain. EXAM: PORTABLE CHEST 1 VIEW COMPARISON:  Two-view chest x-ray 08/27/2020 FINDINGS: Heart is mildly enlarged. Moderate-sized hiatal hernia is again noted. Mild bibasilar airspace opacities are present. The lungs are otherwise clear. Left effusion is suspected. IMPRESSION: 1. Mild bibasilar airspace disease. While this likely reflects atelectasis, early infection is not excluded. 2. Probable small left pleural effusion. Electronically Signed   By: San Morelle M.D.   On: 03/11/2021 15:42    EKG: Independently reviewed.  Sinus tachycardia, frequent PVCs.  Assessment/Plan Principal Problem:   COVID Active Problems:   COVID-19 virus infection  (please populate well all problems here in Problem List. (For example, if patient is on BP meds at home and you resume or decide to hold them, it is a problem that needs to be her. Same for CAD, COPD, HLD and so on)  Acute asthma exacerbation -Probably triggered by COVID-19 infection -Breathing treatment, continue Advair and albuterol, short course of steroid. -Peak flow BID  COVID-19 pneumonia -Given her baseline comorbidities of lung problems/asthma and CAD and diastolic CHF, decided to give her treatment of remdesivir, patient agreed. -Decadron IV daily. -COVID labs ordered.  Chronic diastolic CHF -Euvolemic, continue metoprolol and Entresto  Hypokalemia -Replacement, check Mg  level.  IIDM -Continue to monitor, add sliding scale.  DVT prophylaxis: Lovenox Code Status: Full code Family Communication: None at bedside Disposition Plan: Expect more than 2 midnight hospital stay to treat COVID-19 pneumonia and asthma exacerbation. Consults called: None Admission status: MedSurg admission   Lequita Halt MD [facility] Pager [phone]   03/11/2021, 6:32 PM

## 2021-03-11 NOTE — ED Notes (Signed)
PO fluid trial given. Tolerated well

## 2021-03-12 DIAGNOSIS — J45901 Unspecified asthma with (acute) exacerbation: Secondary | ICD-10-CM

## 2021-03-12 LAB — COMPREHENSIVE METABOLIC PANEL
ALT: 17 U/L (ref 0–44)
AST: 18 U/L (ref 15–41)
Albumin: 2.9 g/dL — ABNORMAL LOW (ref 3.5–5.0)
Alkaline Phosphatase: 72 U/L (ref 38–126)
Anion gap: 11 (ref 5–15)
BUN: 12 mg/dL (ref 8–23)
CO2: 24 mmol/L (ref 22–32)
Calcium: 7.3 mg/dL — ABNORMAL LOW (ref 8.9–10.3)
Chloride: 104 mmol/L (ref 98–111)
Creatinine, Ser: 0.84 mg/dL (ref 0.44–1.00)
GFR, Estimated: >60 mL/min (ref >60)
Glucose, Bld: 203 mg/dL — ABNORMAL HIGH (ref 70–99)
Potassium: 3 mmol/L — ABNORMAL LOW (ref 3.5–5.1)
Sodium: 139 mmol/L (ref 135–145)
Total Bilirubin: 0.6 mg/dL (ref 0.3–1.2)
Total Protein: 6.3 g/dL — ABNORMAL LOW (ref 6.5–8.1)

## 2021-03-12 LAB — CBC WITH DIFFERENTIAL/PLATELET
Abs Immature Granulocytes: 0.1 10*3/uL — ABNORMAL HIGH (ref 0.00–0.07)
Basophils Absolute: 0 10*3/uL (ref 0.0–0.1)
Basophils Relative: 0 %
Eosinophils Absolute: 0 10*3/uL (ref 0.0–0.5)
Eosinophils Relative: 0 %
HCT: 31.6 % — ABNORMAL LOW (ref 36.0–46.0)
Hemoglobin: 10.9 g/dL — ABNORMAL LOW (ref 12.0–15.0)
Immature Granulocytes: 1 %
Lymphocytes Relative: 11 %
Lymphs Abs: 0.9 10*3/uL (ref 0.7–4.0)
MCH: 30.2 pg (ref 26.0–34.0)
MCHC: 34.5 g/dL (ref 30.0–36.0)
MCV: 87.5 fL (ref 80.0–100.0)
Monocytes Absolute: 0.2 10*3/uL (ref 0.1–1.0)
Monocytes Relative: 2 %
Neutro Abs: 7.1 10*3/uL (ref 1.7–7.7)
Neutrophils Relative %: 86 %
Platelets: 227 10*3/uL (ref 150–400)
RBC: 3.61 MIL/uL — ABNORMAL LOW (ref 3.87–5.11)
RDW: 13 % (ref 11.5–15.5)
WBC: 8.3 10*3/uL (ref 4.0–10.5)
nRBC: 0 % (ref 0.0–0.2)

## 2021-03-12 LAB — C-REACTIVE PROTEIN: CRP: 4.1 mg/dL — ABNORMAL HIGH (ref <1.0)

## 2021-03-12 LAB — D-DIMER, QUANTITATIVE: D-Dimer, Quant: 1.02 ug/mL-FEU — ABNORMAL HIGH (ref 0.00–0.50)

## 2021-03-12 LAB — GLUCOSE, CAPILLARY
Glucose-Capillary: 189 mg/dL — ABNORMAL HIGH (ref 70–99)
Glucose-Capillary: 173 mg/dL — ABNORMAL HIGH (ref 70–99)

## 2021-03-12 LAB — CBG MONITORING, ED
Glucose-Capillary: 114 mg/dL — ABNORMAL HIGH (ref 70–99)
Glucose-Capillary: 112 mg/dL — ABNORMAL HIGH (ref 70–99)

## 2021-03-12 LAB — MAGNESIUM: Magnesium: 0.8 mg/dL — CL (ref 1.7–2.4)

## 2021-03-12 LAB — FERRITIN: Ferritin: 91 ng/mL (ref 11–307)

## 2021-03-12 LAB — PHOSPHORUS: Phosphorus: 3.9 mg/dL (ref 2.5–4.6)

## 2021-03-12 MED ORDER — MAGNESIUM SULFATE 4 GM/100ML IV SOLN
4.0000 g | Freq: Once | INTRAVENOUS | Status: AC
  Administered 2021-03-12: 4 g via INTRAVENOUS
  Filled 2021-03-12: qty 100

## 2021-03-12 MED ORDER — METHYLPREDNISOLONE SODIUM SUCC 125 MG IJ SOLR
60.0000 mg | Freq: Two times a day (BID) | INTRAMUSCULAR | Status: DC
  Administered 2021-03-12 – 2021-03-16 (×9): 60 mg via INTRAVENOUS
  Filled 2021-03-12 (×9): qty 2

## 2021-03-12 MED ORDER — POTASSIUM CHLORIDE CRYS ER 20 MEQ PO TBCR
40.0000 meq | EXTENDED_RELEASE_TABLET | ORAL | Status: AC
  Administered 2021-03-12 (×2): 40 meq via ORAL
  Filled 2021-03-12 (×2): qty 2

## 2021-03-12 NOTE — ED Notes (Signed)
MD Howerter notified that pt has had no output while in the ED

## 2021-03-12 NOTE — Progress Notes (Signed)
[facility] night cross cover note:  I was notified by RN that patient complaining of bilateral tremors as well as some double vision.  This is in the context of recent initiation of high-dose systemic corticosteroids for treatment of presenting acute asthma exacerbation.  At the time of the b/l tremors, the patient is noted to be alert and oriented, without evidence of acute focal neurologic deficit, while pupils are noted to be equal round reactive to light b/l.    The above constellation is unlikely to be on the basis of seizures given that the patient remains alert/oriented in spite of the bilateral distribution of the tremors.  Overall, suspect contribution from high-dose systemic corticosteroids, but have ordered every 4 hours neurochecks x3 occurrences to further assess/monitor.     Babs Bertin, DO Hospitalist

## 2021-03-12 NOTE — Progress Notes (Signed)
Patient arrived to unit, VSS, x2 assist from wheelchair to the bed, oriented to room with call bell in reach (bed L&L position).  Admission documentation completed, 2 RN skin check completed.    Bladder scan completed due to report of patient not voiding almost 24 hours- 500+ result.  Patient sat on BSC and voided, recheck ~ 12mL residual post void; unable to obtain clean urinalysis to send to lab (will try again).

## 2021-03-12 NOTE — Progress Notes (Signed)
TRH night cross cover note:  I was notified by RN of patient's serum magnesium level less than 0.5.  I subsequently placed order for magnesium sulfate 2 g IV over 2 hours x1 dose now, and confirmed existing order for repeat serum magnesium level with a.m. labs.    Babs Bertin, DO Hospitalist

## 2021-03-12 NOTE — Progress Notes (Signed)
Patient ID: Nicole [name], female   DOB: 29-May-1945, 76 y.o.   MRN: [id number]  PROGRESS NOTE    Nicole [name]  ZLD:[id number] DOB: October 20, 1945 DOA: 03/11/2021 PCP: [name], MD   Brief Narrative:  76 y.o. female with medical history significant of mild intermittent asthma, CAD with stenting, HTN/chronic diastolic CHF, diabetes mellitus type 2 presented with worsening shortness of breath.  She tested positive for COVID on 03/09/2021 at home.  On presentation to the ED, she was tachypneic, initially requiring 2 L oxygen by nasal cannula.  COVID test was positive.  She was started on remdesivir and steroids.  Assessment & Plan:    COVID-19 infection -Tested positive at home on 03/09/2021. -Currently on room air.  Chest x-ray on presentation showed mild bilateral airspace disease. -Continue to trend daily inflammatory markers. COVID-19 Labs  Recent Labs    03/11/21 1700 03/12/21 0608  DDIMER 0.94* 1.02*  FERRITIN 96 91  LDH 144  --   CRP 4.7* 4.1*    Lab Results  Component Value Date   SARSCOV2NAA POSITIVE (A) 03/11/2021   [place] NEGATIVE 08/27/2020   [place] NEGATIVE 12/18/2019   [place] NEGATIVE 12/11/2019   -Currently on remdesivir and Decadron.  Switch Decadron to IV Solu-Medrol  Acute asthma exacerbation -Probably from above.  Steroid plan as above.  Continue inhalers.  Chronic diastolic CHF -Currently euvolemic.  Continue metoprolol and Entresto.  Strict input and output.  Daily weights.  Hypokalemia -Replace.  Repeat a.m. labs  Anemia of chronic disease -Possibly from chronic illnesses.  Hemoglobin stable.  Diabetes mellitus type 2 -A1c 6.2.  Continue CBGs with SSI.  Continue linagliptin.  Hold metformin   DVT prophylaxis: Lovenox Code Status: Full Family Communication: None at bedside Disposition Plan: Status is: Inpatient  Remains inpatient appropriate because: Of severity of illness and need for IV steroids and  remdesivir  Consultants: None  Procedures: None  Antimicrobials: None   Subjective: Patient seen and examined at bedside.  Still short of breath with exertion.  Has intermittent cough.  Still feels weak.  No overnight fever or vomiting reported.  Objective: Vitals:   03/12/21 0730 03/12/21 0915 03/12/21 0950 03/12/21 1015  BP: 132/85 (!) 120/91 136/82 133/76  Pulse: (!) 32 92 87 85  Resp: 18 (!) 21 14 16   Temp:      TempSrc:      SpO2: 95% 96% 95% 95%  Weight:      Height:        Intake/Output Summary (Last 24 hours) at 03/12/2021 1106 Last data filed at 03/12/2021 0954 Gross per 24 hour  Intake 194.86 ml  Output --  Net 194.86 ml   Filed Weights   03/11/21 1637  Weight: 63.5 kg    Examination:  General exam: Appears calm and comfortable.  Currently on room air. Respiratory system: Bilateral decreased breath sounds at bases with scattered wheezing and intermittent tachypnea Cardiovascular system: S1 & S2 heard, Rate controlled Gastrointestinal system: Abdomen is nondistended, soft and nontender. Normal bowel sounds heard. Extremities: No cyanosis, clubbing; trace lower extremity edema Central nervous system: Alert and oriented. No focal neurological deficits. Moving extremities Skin: No rashes, lesions or ulcers Psychiatry: Judgement and insight appear normal. Mood & affect appropriate.     Data Reviewed: I have personally reviewed following labs and imaging studies  CBC: Recent Labs  Lab 03/11/21 1518 03/12/21 0608  WBC 9.1 8.3  NEUTROABS 6.7 7.1  HGB 11.9* 10.9*  HCT 34.8* 31.6*  MCV 87.4 87.5  PLT 242 277   Basic Metabolic Panel: Recent Labs  Lab 03/11/21 1518 03/11/21 1700 03/12/21 0608  NA 142  --  139  K 3.0*  --  3.0*  CL 105  --  104  CO2 25  --  24  GLUCOSE 152*  --  203*  BUN 7*  --  12  CREATININE 0.92  --  0.84  CALCIUM 7.7*  --  7.3*  MG  --  <0.5* 0.8*  PHOS  --   --  3.9   GFR: Estimated Creatinine Clearance: 52.3 mL/min (by  C-G formula based on SCr of 0.84 mg/dL). Liver Function Tests: Recent Labs  Lab 03/11/21 1518 03/12/21 0608  AST 19 18  ALT 18 17  ALKPHOS 77 72  BILITOT 0.8 0.6  PROT 7.0 6.3*  ALBUMIN 3.2* 2.9*   No results for input(s): LIPASE, AMYLASE in the last 168 hours. No results for input(s): AMMONIA in the last 168 hours. Coagulation Profile: Recent Labs  Lab 03/11/21 1518  INR 1.0   Cardiac Enzymes: No results for input(s): CKTOTAL, CKMB, CKMBINDEX, TROPONINI in the last 168 hours. BNP (last 3 results) No results for input(s): PROBNP in the last 8760 hours. HbA1C: Recent Labs    03/11/21 1732  HGBA1C 6.2*   CBG: Recent Labs  Lab 03/11/21 2352 03/12/21 0717  GLUCAP 129* 114*   Lipid Profile: No results for input(s): CHOL, HDL, LDLCALC, TRIG, CHOLHDL, LDLDIRECT in the last 72 hours. Thyroid Function Tests: No results for input(s): TSH, T4TOTAL, FREET4, T3FREE, THYROIDAB in the last 72 hours. Anemia Panel: Recent Labs    03/11/21 1700 03/12/21 0608  FERRITIN 96 91   Sepsis Labs: Recent Labs  Lab 03/11/21 1518 03/11/21 1700  PROCALCITON  --  <0.10  LATICACIDVEN 1.1 0.9    Recent Results (from the past 240 hour(s))  Culture, blood (routine x 2)     Status: None (Preliminary result)   Collection Time: 03/11/21  3:18 PM   Specimen: BLOOD LEFT ARM  Result Value Ref Range Status   Specimen Description BLOOD LEFT ARM  Final   Special Requests   Final    BOTTLES DRAWN AEROBIC AND ANAEROBIC Blood Culture adequate volume   Culture   Final    NO GROWTH < 24 HOURS Performed at [facility], [place] [address], [place], [place] [address]    Report Status PENDING  Incomplete  Resp Panel by RT-PCR (Flu A&B, Covid) Nasopharyngeal Swab     Status: Abnormal   Collection Time: 03/11/21  3:18 PM   Specimen: Nasopharyngeal Swab; Nasopharyngeal(NP) swabs in vial transport medium  Result Value Ref Range Status   SARS Coronavirus 2 by RT PCR POSITIVE (A) NEGATIVE Final     Comment: (NOTE) SARS-CoV-2 target nucleic acids are DETECTED.  The SARS-CoV-2 RNA is generally detectable in upper respiratory specimens during the acute phase of infection. Positive results are indicative of the presence of the identified virus, but do not rule out bacterial infection or co-infection with other pathogens not detected by the test. Clinical correlation with patient history and other diagnostic information is necessary to determine patient infection status. The expected result is Negative.  Fact Sheet for Patients: EntrepreneurPulse.com.au  Fact Sheet for Healthcare Providers: IncredibleEmployment.be  This test is not yet approved or cleared by the [place] FDA and  has been authorized for detection and/or diagnosis of SARS-CoV-2 by FDA under an Emergency Use Authorization (EUA).  This EUA will remain in effect (meaning this  test can be used) for the duration of  the COVID-19 declaration under Section 564(b)(1) of the A ct, 21 U.S.C. section 360bbb-3(b)(1), unless the authorization is terminated or revoked sooner.     Influenza A by PCR NEGATIVE NEGATIVE Final   Influenza B by PCR NEGATIVE NEGATIVE Final    Comment: (NOTE) The Xpert Xpress SARS-CoV-2/FLU/RSV plus assay is intended as an aid in the diagnosis of influenza from Nasopharyngeal swab specimens and should not be used as a sole basis for treatment. Nasal washings and aspirates are unacceptable for Xpert Xpress SARS-CoV-2/FLU/RSV testing.  Fact Sheet for Patients: EntrepreneurPulse.com.au  Fact Sheet for Healthcare Providers: IncredibleEmployment.be  This test is not yet approved or cleared by the [place] FDA and has been authorized for detection and/or diagnosis of SARS-CoV-2 by FDA under an Emergency Use Authorization (EUA). This EUA will remain in effect (meaning this test can be used) for the duration of  the COVID-19 declaration under Section 564(b)(1) of the Act, 21 U.S.C. section 360bbb-3(b)(1), unless the authorization is terminated or revoked.  Performed at [facility], [place] [address] [place], [place] [address]   Culture, blood (routine x 2)     Status: None (Preliminary result)   Collection Time: 03/11/21  3:35 PM   Specimen: BLOOD  Result Value Ref Range Status   Specimen Description BLOOD RIGHT ANTECUBITAL  Final   Special Requests   Final    BOTTLES DRAWN AEROBIC AND ANAEROBIC Blood Culture results may not be optimal due to an inadequate volume of blood received in culture bottles   Culture   Final    NO GROWTH < 24 HOURS Performed at [facility], [place] [address] [place], [place] [address]    Report Status PENDING  Incomplete         Radiology Studies: DG Chest Port 1 View  Result Date: 03/11/2021 CLINICAL DATA:  Dyspnea. Positive home COVID test 2 days ago. Weakness and shortness of breath. Chest pain. EXAM: PORTABLE CHEST 1 VIEW COMPARISON:  Two-view chest x-ray 08/27/2020 FINDINGS: Heart is mildly enlarged. Moderate-sized hiatal hernia is again noted. Mild bibasilar airspace opacities are present. The lungs are otherwise clear. Left effusion is suspected. IMPRESSION: 1. Mild bibasilar airspace disease. While this likely reflects atelectasis, early infection is not excluded. 2. Probable small left pleural effusion. Electronically Signed   By: San Morelle M.D.   On: 03/11/2021 15:42        Scheduled Meds:  albuterol  2 puff Inhalation Q6H   aspirin  81 mg Oral Daily   atorvastatin  80 mg Oral Daily   dexamethasone (DECADRON) injection  6 mg Intravenous Q24H   DULoxetine  60 mg Oral Daily   enoxaparin (LOVENOX) injection  40 mg Subcutaneous Q24H   insulin aspart  0-15 Units Subcutaneous TID WC   linagliptin  5 mg Oral Daily   loratadine  10 mg Oral Daily   LORazepam  1 mg Oral BID   metFORMIN  500 mg Oral Q breakfast   metoprolol tartrate   25 mg Oral BID   mometasone-formoterol  2 puff Inhalation BID   pantoprazole  40 mg Oral BID   potassium chloride SA  40 mEq Oral Q4H   sacubitril-valsartan  1 tablet Oral BID   traZODone  50 mg Oral QHS   Vitamin D (Ergocalciferol)  50,000 Units Oral Weekly   Continuous Infusions:  remdesivir 100 mg in NS 100 mL 100 mg (03/12/21 1032)  Aline August, MD [facility] 03/12/2021, 11:06 AM

## 2021-03-12 NOTE — ED Notes (Signed)
MD paged and notified of critical magnesium

## 2021-03-12 NOTE — Progress Notes (Signed)
TRH night cross cover note:  I was notified by RN that the patient has had no urine output overnight.  Vital signs stable, including normotensive blood pressures .will continue to monitor strict I's and O's and daily weights, and follow for result of updated renal function per a.m. labs.    Babs Bertin, DO Hospitalist

## 2021-03-12 NOTE — ED Notes (Signed)
Breakfast Orders Placed °

## 2021-03-13 LAB — CBC WITH DIFFERENTIAL/PLATELET
Abs Immature Granulocytes: 0.12 10*3/uL — ABNORMAL HIGH (ref 0.00–0.07)
Basophils Absolute: 0 10*3/uL (ref 0.0–0.1)
Basophils Relative: 0 %
Eosinophils Absolute: 0 10*3/uL (ref 0.0–0.5)
Eosinophils Relative: 0 %
HCT: 29.5 % — ABNORMAL LOW (ref 36.0–46.0)
Hemoglobin: 9.7 g/dL — ABNORMAL LOW (ref 12.0–15.0)
Immature Granulocytes: 2 %
Lymphocytes Relative: 10 %
Lymphs Abs: 0.8 10*3/uL (ref 0.7–4.0)
MCH: 29.2 pg (ref 26.0–34.0)
MCHC: 32.9 g/dL (ref 30.0–36.0)
MCV: 88.9 fL (ref 80.0–100.0)
Monocytes Absolute: 0.1 10*3/uL (ref 0.1–1.0)
Monocytes Relative: 1 %
Neutro Abs: 6.9 10*3/uL (ref 1.7–7.7)
Neutrophils Relative %: 87 %
Platelets: 229 10*3/uL (ref 150–400)
RBC: 3.32 MIL/uL — ABNORMAL LOW (ref 3.87–5.11)
RDW: 13 % (ref 11.5–15.5)
WBC: 7.9 10*3/uL (ref 4.0–10.5)
nRBC: 0 % (ref 0.0–0.2)

## 2021-03-13 LAB — COMPREHENSIVE METABOLIC PANEL
ALT: 17 U/L (ref 0–44)
AST: 17 U/L (ref 15–41)
Albumin: 2.8 g/dL — ABNORMAL LOW (ref 3.5–5.0)
Alkaline Phosphatase: 67 U/L (ref 38–126)
Anion gap: 9 (ref 5–15)
BUN: 20 mg/dL (ref 8–23)
CO2: 22 mmol/L (ref 22–32)
Calcium: 7.8 mg/dL — ABNORMAL LOW (ref 8.9–10.3)
Chloride: 107 mmol/L (ref 98–111)
Creatinine, Ser: 0.88 mg/dL (ref 0.44–1.00)
GFR, Estimated: >60 mL/min (ref >60)
Glucose, Bld: 206 mg/dL — ABNORMAL HIGH (ref 70–99)
Potassium: 4.1 mmol/L (ref 3.5–5.1)
Sodium: 138 mmol/L (ref 135–145)
Total Bilirubin: 0.7 mg/dL (ref 0.3–1.2)
Total Protein: 6.3 g/dL — ABNORMAL LOW (ref 6.5–8.1)

## 2021-03-13 LAB — GLUCOSE, CAPILLARY
Glucose-Capillary: 172 mg/dL — ABNORMAL HIGH (ref 70–99)
Glucose-Capillary: 179 mg/dL — ABNORMAL HIGH (ref 70–99)
Glucose-Capillary: 173 mg/dL — ABNORMAL HIGH (ref 70–99)

## 2021-03-13 LAB — URINALYSIS, ROUTINE W REFLEX MICROSCOPIC
Bilirubin Urine: NEGATIVE
Glucose, UA: NEGATIVE mg/dL
Hgb urine dipstick: NEGATIVE
Ketones, ur: NEGATIVE mg/dL
Nitrite: NEGATIVE
Protein, ur: NEGATIVE mg/dL
Specific Gravity, Urine: 1.02 (ref 1.005–1.030)
pH: 6 (ref 5.0–8.0)

## 2021-03-13 LAB — MAGNESIUM: Magnesium: 1.9 mg/dL (ref 1.7–2.4)

## 2021-03-13 LAB — C-REACTIVE PROTEIN: CRP: 2.4 mg/dL — ABNORMAL HIGH (ref <1.0)

## 2021-03-13 LAB — URINALYSIS, MICROSCOPIC (REFLEX)

## 2021-03-13 LAB — D-DIMER, QUANTITATIVE: D-Dimer, Quant: 1 ug/mL-FEU — ABNORMAL HIGH (ref 0.00–0.50)

## 2021-03-13 MED ORDER — ACETAMINOPHEN 325 MG PO TABS
650.0000 mg | ORAL_TABLET | Freq: Four times a day (QID) | ORAL | Status: DC | PRN
  Administered 2021-03-13 – 2021-03-15 (×4): 650 mg via ORAL
  Filled 2021-03-13 (×4): qty 2

## 2021-03-13 NOTE — Progress Notes (Signed)
TRH night cross cover note:  I was notified by RN of request for Tylenol for patient's headache.  Subsequently, I placed order for as needed acetaminophen.     Babs Bertin, DO Hospitalist

## 2021-03-13 NOTE — Progress Notes (Addendum)
Patient ID: OZELLE BRUBACHER, female   DOB: [birth year], 76 y.o.   MRN: [id number]  PROGRESS NOTE    KNOX HOLDMAN  [facility]:[id number] DOB: 1946-02-15 DOA: 03/11/2021 PCP: [name], MD   Brief Narrative:  76 y.o. female with medical history significant of mild intermittent asthma, CAD with stenting, HTN/chronic diastolic CHF, diabetes mellitus type 2 presented with worsening shortness of breath.  She tested positive for COVID on 03/09/2021 at home.  On presentation to the ED, she was tachypneic, initially requiring 2 L oxygen by nasal cannula.  COVID test was positive.  She was started on remdesivir and steroids.  Assessment & Plan:    COVID-19 infection -Tested positive at home on 03/09/2021. -Currently still on room air.  Chest x-ray on presentation showed mild bilateral airspace disease. -Continue to trend daily inflammatory markers. COVID-19 Labs  Recent Labs    03/11/21 1700 03/12/21 0608 03/13/21 0134  DDIMER 0.94* 1.02* 1.00*  FERRITIN 96 91  --   LDH 144  --   --   CRP 4.7* 4.1* 2.4*     Lab Results  Component Value Date   SARSCOV2NAA POSITIVE (A) 03/11/2021   [place] NEGATIVE 08/27/2020   [place] NEGATIVE 12/18/2019   [place] NEGATIVE 12/11/2019   -Currently on remdesivir and Solu-Medrol  Acute asthma exacerbation -Probably from above.  Steroid plan as above.  Continue inhalers.  Chronic diastolic CHF -Currently euvolemic.  Continue metoprolol and Entresto.  Strict input and output.  Daily weights.  Hypokalemia -Improved.  Hypomagnesemia -Resolved  Anemia of chronic disease -Possibly from chronic illnesses.  Hemoglobin stable.  Diabetes mellitus type 2 -A1c 6.2.  Continue CBGs with SSI.  Continue linagliptin.  Hold metformin   DVT prophylaxis: Lovenox Code Status: Full Family Communication: None at bedside Disposition Plan: Status is: Inpatient  Remains inpatient appropriate because: Of severity of illness and need for IV steroids and  remdesivir  Consultants: None  Procedures: None  Antimicrobials: None   Subjective: Patient seen and examined at bedside.  Oral intake is improving but still not that great yet. Still feels weak.  Still short of breath with minimal exertion with intermittent cough.  No overnight fever, diarrhea or chest pain reported.   Objective: Vitals:   03/12/21 1945 03/13/21 0000 03/13/21 0452 03/13/21 0500  BP: 108/65 119/76 127/83   Pulse: 87 87 86   Resp: 20 20 20    Temp: (!) 97.3 F (36.3 C) (!) 97 F (36.1 C) (!) 97.3 F (36.3 C)   TempSrc: Oral Oral Oral   SpO2: 97% 95% 96%   Weight:    75.5 kg  Height:        Intake/Output Summary (Last 24 hours) at 03/13/2021 0800 Last data filed at 03/12/2021 1600 Gross per 24 hour  Intake 200 ml  Output 500 ml  Net -300 ml    Filed Weights   03/11/21 1637 03/13/21 0500  Weight: 63.5 kg 75.5 kg    Examination:  General exam: No distress.  On room air currently. Respiratory system: Decreased breath sounds at bases bilaterally with scattered crackles and some wheezing  cardiovascular system: Currently rate controlled, S1-S2 heard Gastrointestinal system: Abdomen is slightly distended, soft and nontender.  Bowel sounds are heard Extremities: Mild lower extremity edema present; no cyanosis Central nervous system: Awake and alert.  No focal neurological deficits.  Moves extremities  skin: No obvious ecchymosis/lesions  psychiatry: Mood, affect and judgment are normal   Data Reviewed: I have personally reviewed following labs and imaging  studies  CBC: Recent Labs  Lab 03/11/21 1518 03/12/21 0608 03/13/21 0134  WBC 9.1 8.3 7.9  NEUTROABS 6.7 7.1 6.9  HGB 11.9* 10.9* 9.7*  HCT 34.8* 31.6* 29.5*  MCV 87.4 87.5 88.9  PLT 242 227 878    Basic Metabolic Panel: Recent Labs  Lab 03/11/21 1518 03/11/21 1700 03/12/21 0608 03/13/21 0134  NA 142  --  139 138  K 3.0*  --  3.0* 4.1  CL 105  --  104 107  CO2 25  --  24 22  GLUCOSE  152*  --  203* 206*  BUN 7*  --  12 20  CREATININE 0.92  --  0.84 0.88  CALCIUM 7.7*  --  7.3* 7.8*  MG  --  <0.5* 0.8* 1.9  PHOS  --   --  3.9  --     GFR: Estimated Creatinine Clearance: 54.2 mL/min (by C-G formula based on SCr of 0.88 mg/dL). Liver Function Tests: Recent Labs  Lab 03/11/21 1518 03/12/21 0608 03/13/21 0134  AST 19 18 17   ALT 18 17 17   ALKPHOS 77 72 67  BILITOT 0.8 0.6 0.7  PROT 7.0 6.3* 6.3*  ALBUMIN 3.2* 2.9* 2.8*    No results for input(s): LIPASE, AMYLASE in the last 168 hours. No results for input(s): AMMONIA in the last 168 hours. Coagulation Profile: Recent Labs  Lab 03/11/21 1518  INR 1.0    Cardiac Enzymes: No results for input(s): CKTOTAL, CKMB, CKMBINDEX, TROPONINI in the last 168 hours. BNP (last 3 results) No results for input(s): PROBNP in the last 8760 hours. HbA1C: Recent Labs    03/11/21 1732  HGBA1C 6.2*    CBG: Recent Labs  Lab 03/11/21 2352 03/12/21 0717 03/12/21 1218 03/12/21 1650 03/12/21 2140  GLUCAP 129* 114* 112* 189* 173*    Lipid Profile: No results for input(s): CHOL, HDL, LDLCALC, TRIG, CHOLHDL, LDLDIRECT in the last 72 hours. Thyroid Function Tests: No results for input(s): TSH, T4TOTAL, FREET4, T3FREE, THYROIDAB in the last 72 hours. Anemia Panel: Recent Labs    03/11/21 1700 03/12/21 0608  FERRITIN 96 91    Sepsis Labs: Recent Labs  Lab 03/11/21 1518 03/11/21 1700  PROCALCITON  --  <0.10  LATICACIDVEN 1.1 0.9     Recent Results (from the past 240 hour(s))  Culture, blood (routine x 2)     Status: None (Preliminary result)   Collection Time: 03/11/21  3:18 PM   Specimen: BLOOD LEFT ARM  Result Value Ref Range Status   Specimen Description BLOOD LEFT ARM  Final   Special Requests   Final    BOTTLES DRAWN AEROBIC AND ANAEROBIC Blood Culture adequate volume   Culture   Final    NO GROWTH < 24 HOURS Performed at [facility], [place] [address], [place],  [address]    Report  Status PENDING  Incomplete  Resp Panel by RT-PCR (Flu A&B, Covid) Nasopharyngeal Swab     Status: Abnormal   Collection Time: 03/11/21  3:18 PM   Specimen: Nasopharyngeal Swab; Nasopharyngeal(NP) swabs in vial transport medium  Result Value Ref Range Status   SARS Coronavirus 2 by RT PCR POSITIVE (A) NEGATIVE Final    Comment: (NOTE) SARS-CoV-2 target nucleic acids are DETECTED.  The SARS-CoV-2 RNA is generally detectable in upper respiratory specimens during the acute phase of infection. Positive results are indicative of the presence of the identified virus, but do not rule out bacterial infection or co-infection with other pathogens not detected  by the test. Clinical correlation with patient history and other diagnostic information is necessary to determine patient infection status. The expected result is Negative.  Fact Sheet for Patients: EntrepreneurPulse.com.au  Fact Sheet for Healthcare Providers: IncredibleEmployment.be  This test is not yet approved or cleared by the [place] FDA and  has been authorized for detection and/or diagnosis of SARS-CoV-2 by FDA under an Emergency Use Authorization (EUA).  This EUA will remain in effect (meaning this test can be used) for the duration of  the COVID-19 declaration under Section 564(b)(1) of the A ct, 21 U.S.C. section 360bbb-3(b)(1), unless the authorization is terminated or revoked sooner.     Influenza A by PCR NEGATIVE NEGATIVE Final   Influenza B by PCR NEGATIVE NEGATIVE Final    Comment: (NOTE) The Xpert Xpress SARS-CoV-2/FLU/RSV plus assay is intended as an aid in the diagnosis of influenza from Nasopharyngeal swab specimens and should not be used as a sole basis for treatment. Nasal washings and aspirates are unacceptable for Xpert Xpress SARS-CoV-2/FLU/RSV testing.  Fact Sheet for Patients: EntrepreneurPulse.com.au  Fact Sheet for Healthcare  Providers: IncredibleEmployment.be  This test is not yet approved or cleared by the [place] FDA and has been authorized for detection and/or diagnosis of SARS-CoV-2 by FDA under an Emergency Use Authorization (EUA). This EUA will remain in effect (meaning this test can be used) for the duration of the COVID-19 declaration under Section 564(b)(1) of the Act, 21 U.S.C. section 360bbb-3(b)(1), unless the authorization is terminated or revoked.  Performed at [facility], [place] [address], [place], [place] [address]   Culture, blood (routine x 2)     Status: None (Preliminary result)   Collection Time: 03/11/21  3:35 PM   Specimen: BLOOD  Result Value Ref Range Status   Specimen Description BLOOD RIGHT ANTECUBITAL  Final   Special Requests   Final    BOTTLES DRAWN AEROBIC AND ANAEROBIC Blood Culture results may not be optimal due to an inadequate volume of blood received in culture bottles   Culture   Final    NO GROWTH < 24 HOURS Performed at [facility], [place] [address], [place], [place] [address]    Report Status PENDING  Incomplete          Radiology Studies: DG Chest Port 1 View  Result Date: 03/11/2021 CLINICAL DATA:  Dyspnea. Positive home COVID test 2 days ago. Weakness and shortness of breath. Chest pain. EXAM: PORTABLE CHEST 1 VIEW COMPARISON:  Two-view chest x-ray 08/27/2020 FINDINGS: Heart is mildly enlarged. Moderate-sized hiatal hernia is again noted. Mild bibasilar airspace opacities are present. The lungs are otherwise clear. Left effusion is suspected. IMPRESSION: 1. Mild bibasilar airspace disease. While this likely reflects atelectasis, early infection is not excluded. 2. Probable small left pleural effusion. Electronically Signed   By: San Morelle M.D.   On: 03/11/2021 15:42        Scheduled Meds:  albuterol  2 puff Inhalation Q6H   aspirin  81 mg Oral Daily   atorvastatin  80 mg Oral Daily   DULoxetine  60 mg  Oral Daily   enoxaparin (LOVENOX) injection  40 mg Subcutaneous Q24H   insulin aspart  0-15 Units Subcutaneous TID WC   linagliptin  5 mg Oral Daily   loratadine  10 mg Oral Daily   LORazepam  1 mg Oral BID   methylPREDNISolone (SOLU-MEDROL) injection  60 mg Intravenous Q12H   metoprolol tartrate  25 mg Oral BID  mometasone-formoterol  2 puff Inhalation BID   pantoprazole  40 mg Oral BID   sacubitril-valsartan  1 tablet Oral BID   traZODone  50 mg Oral QHS   Vitamin D (Ergocalciferol)  50,000 Units Oral Weekly   Continuous Infusions:  remdesivir 100 mg in NS 100 mL Stopped (03/12/21 1112)          Aline August, MD [facility] 03/13/2021, 8:00 AM

## 2021-03-13 NOTE — Evaluation (Signed)
Physical Therapy Evaluation Patient Details Name: Nicole [name] MRN: [id number] DOB: 06/19/1945 Today's Date: 03/13/2021  History of Present Illness  76 y.o. female presented 03/11/21 with new onset of shortness of breath breathing for 2 days. COVID+; acute asthma exacerbation;    PMH significant of mild intermittent asthma, CAD with stenting, HTN/chronic diastolic CHF, IIDM  Clinical Impression   Pt admitted secondary to problem above with deficits below. PTA patient was at home with daughter staying with her (indefinitely) and using rollator for gait with supervision due to dizziness/unsteadiness since first round of [place] 2 years ago.  Pt currently requires min guard assist for transfers and ambulation with RW. Anticipate patient will benefit from PT to address problems listed below.Will continue to follow acutely to maximize functional mobility independence and safety.          Recommendations for follow up therapy are one component of a multi-disciplinary discharge planning process, led by the attending physician.  Recommendations may be updated based on patient status, additional functional criteria and insurance authorization.  Follow Up Recommendations No PT follow up    Assistance Recommended at Discharge PRN  Patient can return home with the following  A little help with bathing/dressing/bathroom;A little help with walking and/or transfers;Help with stairs or ramp for entrance    Equipment Recommendations None recommended by PT  Recommendations for Other Services  OT consult    Functional Status Assessment Patient has had a recent decline in their functional status and demonstrates the ability to make significant improvements in function in a reasonable and predictable amount of time.     Precautions / Restrictions Precautions Precautions: Fall      Mobility  Bed Mobility Overal bed mobility: Modified Independent             General bed mobility comments: incr  time    Transfers Overall transfer level: Needs assistance Equipment used: Rolling walker (2 wheels) Transfers: Sit to/from Stand Sit to Stand: Min guard           General transfer comment: pt used to pulling up on rollator; vc for proper hand placement yet insists on hands on RW; close guarding for safety due to risk of tipping RW    Ambulation/Gait Ambulation/Gait assistance: Min guard Gait Distance (Feet): 75 Feet Assistive device: Rolling walker (2 wheels) Gait Pattern/deviations: Step-through pattern;Decreased stride length;Wide base of support Gait velocity: decr     General Gait Details: slightly unsteady with minor drift to her right initially  Stairs            Wheelchair Mobility    Modified Rankin (Stroke Patients Only)       Balance Overall balance assessment: Needs assistance   Sitting balance-Leahy Scale: Good       Standing balance-Leahy Scale: Poor                               Pertinent Vitals/Pain Pain Assessment: No/denies pain    Home Living Family/patient expects to be discharged to:: Private residence Living Arrangements: Alone Available Help at Discharge: Family;Available 24 hours/day Type of Home: Mobile home Home Access: Ramped entrance       Home Layout: One level Home Equipment: Rollator (4 wheels);Grab bars - tub/shower;BSC/3in1;Cane - single point Additional Comments: Daughter has been staying with pt with no immediate plans to leave    Prior Function Prior Level of Function : Needs assist       Physical  Assist : Mobility (physical);ADLs (physical) Mobility (physical): Gait ADLs (physical): Bathing;Dressing;IADLs Mobility Comments: very dizzy in mornings since had COVID 2 years ago; uses rollator and progresses to cane if "a good day" ADLs Comments: uses BSC as shower chair.Daughter assists with IADLs     Hand Dominance   Dominant Hand: Right    Extremity/Trunk Assessment   Upper Extremity  Assessment Upper Extremity Assessment: Generalized weakness    Lower Extremity Assessment Lower Extremity Assessment: Generalized weakness    Cervical / Trunk Assessment Cervical / Trunk Assessment: Normal  Communication   Communication: No difficulties  Cognition Arousal/Alertness: Awake/alert Behavior During Therapy: WFL for tasks assessed/performed Overall Cognitive Status: Within Functional Limits for tasks assessed                                          General Comments General comments (skin integrity, edema, etc.): on room air; pleth not working for [employer]; dyspnea 1/4    Exercises     Assessment/Plan    PT Assessment Patient needs continued PT services  PT Problem List Decreased strength;Decreased balance;Decreased mobility;Decreased knowledge of use of DME       PT Treatment Interventions DME instruction;Gait training;Functional mobility training;Therapeutic activities;Therapeutic exercise;Balance training;Patient/family education    PT Goals (Current goals can be found in the Care Plan section)  Acute Rehab PT Goals Patient Stated Goal: keep strength up while hospitalized PT Goal Formulation: With patient Time For Goal Achievement: 03/27/21 Potential to Achieve Goals: Good    Frequency Min 3X/week     Co-evaluation               AM-PAC PT "6 Clicks" Mobility  Outcome Measure Help needed turning from your back to your side while in a flat bed without using bedrails?: None Help needed moving from lying on your back to sitting on the side of a flat bed without using bedrails?: None Help needed moving to and from a bed to a chair (including a wheelchair)?: A Little Help needed standing up from a chair using your arms (e.g., wheelchair or bedside chair)?: A Little Help needed to walk in hospital room?: A Little Help needed climbing 3-5 steps with a railing? : A Little 6 Click Score: 20    End of Session   Activity Tolerance:  Patient tolerated treatment well Patient left: in chair;with call bell/phone within reach (pt able to verbalize not to get up alone and how to use call bell) Nurse Communication: Mobility status;Other (comment) (pt wants to walk more today) PT Visit Diagnosis: Unsteadiness on feet (R26.81)    Time: 9191-6606 PT Time Calculation (min) (ACUTE ONLY): 34 min   Charges:   PT Evaluation $PT Eval Moderate Complexity: 1 Mod PT Treatments $Gait Training: 8-22 mins         Arby Barrette, PT Acute Rehabilitation Services  Pager [phone] Office [phone]   Rexanne Mano 03/13/2021, 9:46 AM

## 2021-03-13 NOTE — Progress Notes (Signed)
°  Transition of Care [facility]) Screening Note   Patient Details  Name: Nicole [name] Date of Birth: [birth year]   [facility]) CM/SW Contact:    Benard Halsted, LCSW Phone Number: 03/13/2021, 8:30 AM    Transition of Care Department [facility]) has reviewed patient and no TOC needs have been identified at this time. We will continue to monitor patient advancement through interdisciplinary progression rounds. If new patient transition needs arise, please place a TOC consult.

## 2021-03-14 LAB — CBC WITH DIFFERENTIAL/PLATELET
Abs Immature Granulocytes: 0.21 10*3/uL — ABNORMAL HIGH (ref 0.00–0.07)
Basophils Absolute: 0 10*3/uL (ref 0.0–0.1)
Basophils Relative: 0 %
Eosinophils Absolute: 0 10*3/uL (ref 0.0–0.5)
Eosinophils Relative: 0 %
HCT: 30.8 % — ABNORMAL LOW (ref 36.0–46.0)
Hemoglobin: 10.2 g/dL — ABNORMAL LOW (ref 12.0–15.0)
Immature Granulocytes: 2 %
Lymphocytes Relative: 10 %
Lymphs Abs: 1 10*3/uL (ref 0.7–4.0)
MCH: 29.6 pg (ref 26.0–34.0)
MCHC: 33.1 g/dL (ref 30.0–36.0)
MCV: 89.3 fL (ref 80.0–100.0)
Monocytes Absolute: 0.3 10*3/uL (ref 0.1–1.0)
Monocytes Relative: 3 %
Neutro Abs: 8.5 10*3/uL — ABNORMAL HIGH (ref 1.7–7.7)
Neutrophils Relative %: 85 %
Platelets: 227 10*3/uL (ref 150–400)
RBC: 3.45 MIL/uL — ABNORMAL LOW (ref 3.87–5.11)
RDW: 13 % (ref 11.5–15.5)
WBC: 10.1 10*3/uL (ref 4.0–10.5)
nRBC: 0 % (ref 0.0–0.2)

## 2021-03-14 LAB — COMPREHENSIVE METABOLIC PANEL
ALT: 15 U/L (ref 0–44)
AST: 14 U/L — ABNORMAL LOW (ref 15–41)
Albumin: 2.9 g/dL — ABNORMAL LOW (ref 3.5–5.0)
Alkaline Phosphatase: 65 U/L (ref 38–126)
Anion gap: 8 (ref 5–15)
BUN: 20 mg/dL (ref 8–23)
CO2: 23 mmol/L (ref 22–32)
Calcium: 9.2 mg/dL (ref 8.9–10.3)
Chloride: 106 mmol/L (ref 98–111)
Creatinine, Ser: 0.95 mg/dL (ref 0.44–1.00)
GFR, Estimated: >60 mL/min (ref >60)
Glucose, Bld: 147 mg/dL — ABNORMAL HIGH (ref 70–99)
Potassium: 4.8 mmol/L (ref 3.5–5.1)
Sodium: 137 mmol/L (ref 135–145)
Total Bilirubin: 0.5 mg/dL (ref 0.3–1.2)
Total Protein: 6.2 g/dL — ABNORMAL LOW (ref 6.5–8.1)

## 2021-03-14 LAB — MAGNESIUM: Magnesium: 1.8 mg/dL (ref 1.7–2.4)

## 2021-03-14 LAB — GLUCOSE, CAPILLARY
Glucose-Capillary: 166 mg/dL — ABNORMAL HIGH (ref 70–99)
Glucose-Capillary: 124 mg/dL — ABNORMAL HIGH (ref 70–99)
Glucose-Capillary: 100 mg/dL — ABNORMAL HIGH (ref 70–99)
Glucose-Capillary: 229 mg/dL — ABNORMAL HIGH (ref 70–99)

## 2021-03-14 LAB — BRAIN NATRIURETIC PEPTIDE: B Natriuretic Peptide: 82.2 pg/mL (ref 0.0–100.0)

## 2021-03-14 LAB — C-REACTIVE PROTEIN: CRP: 0.9 mg/dL (ref <1.0)

## 2021-03-14 LAB — D-DIMER, QUANTITATIVE: D-Dimer, Quant: 1.01 ug/mL-FEU — ABNORMAL HIGH (ref 0.00–0.50)

## 2021-03-14 NOTE — Progress Notes (Signed)
Physical Therapy Treatment Patient Details Name: Nicole [name] MRN: [id number] DOB: 10/24/1945 Today's Date: 03/14/2021   History of Present Illness 76 y.o. female presented 03/11/21 with new onset of shortness of breath breathing for 2 days. COVID+; acute asthma exacerbation;    PMH significant of mild intermittent asthma, CAD with stenting, HTN/chronic diastolic CHF, IIDM    PT Comments    Patient remains mildly unsteady on her feet which she reports has been the case x 2 years since she first had COVID. She reports at times her dizziness is severe enough that she either has to sit back down or she has even fallen several times at home. Updated recommendation to include HHPT for home safety evaluation and balance training.     Recommendations for follow up therapy are one component of a multi-disciplinary discharge planning process, led by the attending physician.  Recommendations may be updated based on patient status, additional functional criteria and insurance authorization.  Follow Up Recommendations  Home health PT (pt admitted to falls in home related to dizziness)     Assistance Recommended at Discharge PRN  Patient can return home with the following A little help with bathing/dressing/bathroom;A little help with walking and/or transfers;Help with stairs or ramp for entrance   Equipment Recommendations  None recommended by PT    Recommendations for Other Services OT consult     Precautions / Restrictions Precautions Precautions: Fall Precaution Comments: reports falls at home related to dizziness     Mobility  Bed Mobility Overal bed mobility: Modified Independent             General bed mobility comments: incr time    Transfers Overall transfer level: Needs assistance Equipment used: Rolling walker (2 wheels) Transfers: Sit to/from Stand Sit to Stand: Min guard           General transfer comment: pt used to pulling up on rollator; vc for proper hand  placement yet insists on hands on RW; close guarding for safety due to risk of tipping RW    Ambulation/Gait Ambulation/Gait assistance: Min guard Gait Distance (Feet): 90 Feet Assistive device: Rolling walker (2 wheels) Gait Pattern/deviations: Step-through pattern;Decreased stride length;Wide base of support;Drifts right/left Gait velocity: decr     General Gait Details: slightly unsteady with minor drift to her left initially; vc for safest use of RW as approached sink to brush teeth (However pt accustomed to rollator at home, and her technique would have been appropriate for rollator)   Stairs             Wheelchair Mobility    Modified Rankin (Stroke Patients Only)       Balance Overall balance assessment: Needs assistance   Sitting balance-Leahy Scale: Good     Standing balance support: No upper extremity supported Standing balance-Leahy Scale: Fair Standing balance comment: able to stand without support for up to 30 sec prior to ambulation and at sink preparing to brush teeth                            Cognition Arousal/Alertness: Awake/alert Behavior During Therapy: WFL for tasks assessed/performed Overall Cognitive Status: Within Functional Limits for tasks assessed                                          Exercises      General  Comments General comments (skin integrity, edema, etc.): denies dizziness, yet remains unsteady on her feet even with RW      Pertinent Vitals/Pain Pain Assessment: No/denies pain    Home Living                          Prior Function            PT Goals (current goals can now be found in the care plan section) Acute Rehab PT Goals Patient Stated Goal: keep strength up while hospitalized Time For Goal Achievement: 03/27/21 Potential to Achieve Goals: Good Progress towards PT goals: Progressing toward goals    Frequency    Min 3X/week      PT Plan Discharge plan needs  to be updated    Co-evaluation              AM-PAC PT "6 Clicks" Mobility   Outcome Measure  Help needed turning from your back to your side while in a flat bed without using bedrails?: None Help needed moving from lying on your back to sitting on the side of a flat bed without using bedrails?: None Help needed moving to and from a bed to a chair (including a wheelchair)?: A Little Help needed standing up from a chair using your arms (e.g., wheelchair or bedside chair)?: A Little Help needed to walk in hospital room?: A Little Help needed climbing 3-5 steps with a railing? : A Little 6 Click Score: 20    End of Session   Activity Tolerance: Patient tolerated treatment well Patient left: with call bell/phone within reach;in bed;with bed alarm set (pt insisted on return to bed as RN told her she could stay in bed until lunch) Nurse Communication: Mobility status PT Visit Diagnosis: Unsteadiness on feet (R26.81)     Time: 1749-4496 PT Time Calculation (min) (ACUTE ONLY): 21 min  Charges:  $Gait Training: 8-22 mins                      Arby Barrette, [place]  Pager [phone] Office [phone]    Rexanne Mano 03/14/2021, 10:34 AM

## 2021-03-14 NOTE — Progress Notes (Signed)
Patient ID: Nicole [name], female   DOB: 01-Aug-1945, 76 y.o.   MRN: [id number]  PROGRESS NOTE    Nicole [name]  GYB:[id number] DOB: 07-26-1945 DOA: 03/11/2021 PCP: [name], MD   Brief Narrative:  76 y.o. female with medical history significant of mild intermittent asthma, CAD with stenting, HTN/chronic diastolic CHF, diabetes mellitus type 2 presented with worsening shortness of breath.  She tested positive for COVID on 03/09/2021 at home.  On presentation to the ED, she was tachypneic, initially requiring 2 L oxygen by nasal cannula.  COVID test was positive.  She was started on remdesivir and steroids.    Subjective:  Patient in bed, appears comfortable, denies any headache, no fever, no chest pain or pressure, no shortness of breath , no abdominal pain. No new focal weakness.  Assessment & Plan:    Acute COVID-19 infection  causing mild COVID-19 pneumonia and acute on chronic asthma exacerbation -  Tested positive at home on 03/09/2021.  She has been placed on IV steroids with improvement, inflammatory markers are trending down, clinically stable.  Advance activity.  Still has some wheezing.  Taper to oral steroids in the next 1 to 2 days supportive care with nebulizer treatments and oxygen as needed.   Chronic diastolic CHF  -Currently euvolemic.  Continue metoprolol and Entresto.  Strict input and output.  Daily weights.  Anemia of chronic disease  -Possibly from chronic illnesses.  Hemoglobin stable.  Diabetes mellitus type 2 - A1c 6.2.  Continue CBGs with SSI.  Continue linagliptin.  Hold metformin  Weakness and deconditioning.  Continue PT OT.  Still has poor balance   DVT prophylaxis: Lovenox Code Status: Full Family Communication: None at bedside Disposition Plan: Status is: Inpatient  Remains inpatient appropriate because: Of severity of illness and need for IV steroids and remdesivir  Consultants: None  Procedures: None  Antimicrobials:  None  Objective: Vitals:   03/13/21 2007 03/14/21 0400 03/14/21 0500 03/14/21 0758  BP: 118/66 132/70  (!) 105/56  Pulse:  84  63  Resp: 19 20  19   Temp: 98 F (36.7 C) (!) 97.3 F (36.3 C)  97.6 F (36.4 C)  TempSrc: Oral Oral  Oral  SpO2: 96% 97%  93%  Weight:   75.5 kg   Height:        Intake/Output Summary (Last 24 hours) at 03/14/2021 1014 Last data filed at 03/14/2021 0858 Gross per 24 hour  Intake 720 ml  Output 850 ml  Net -130 ml   Filed Weights   03/11/21 1637 03/13/21 0500 03/14/21 0500  Weight: 63.5 kg 75.5 kg 75.5 kg    Examination:  Awake Alert, No new F.N deficits, Normal affect [place].AT,PERRAL Supple Neck, No JVD,   Symmetrical Chest wall movement, Good air movement bilaterally, mild wheezing RRR,No Gallops, Rubs or new Murmurs,  +ve B.Sounds, Abd Soft, No tenderness,   No Cyanosis, Clubbing or edema     Data Reviewed: I have personally reviewed following labs and imaging studies  Recent Labs  Lab 03/11/21 1518 03/12/21 0608 03/13/21 0134 03/14/21 0116  WBC 9.1 8.3 7.9 10.1  HGB 11.9* 10.9* 9.7* 10.2*  HCT 34.8* 31.6* 29.5* 30.8*  PLT 242 227 229 227  MCV 87.4 87.5 88.9 89.3  MCH 29.9 30.2 29.2 29.6  MCHC 34.2 34.5 32.9 33.1  RDW 13.1 13.0 13.0 13.0  LYMPHSABS 1.7 0.9 0.8 1.0  MONOABS 0.6 0.2 0.1 0.3  EOSABS 0.0 0.0 0.0 0.0  BASOSABS 0.0 0.0 0.0 0.0  Recent Labs  Lab 03/11/21 1518 03/11/21 1700 03/11/21 1732 03/11/21 1733 03/12/21 0608 03/13/21 0134 03/14/21 0116  NA 142  --   --   --  139 138 137  K 3.0*  --   --   --  3.0* 4.1 4.8  CL 105  --   --   --  104 107 106  CO2 25  --   --   --  24 22 23   GLUCOSE 152*  --   --   --  203* 206* 147*  BUN 7*  --   --   --  12 20 20   CREATININE 0.92  --   --   --  0.84 0.88 0.95  CALCIUM 7.7*  --   --   --  7.3* 7.8* 9.2  AST 19  --   --   --  18 17 14*  ALT 18  --   --   --  17 17 15   ALKPHOS 77  --   --   --  72 67 65  BILITOT 0.8  --   --   --  0.6 0.7 0.5  ALBUMIN 3.2*  --   --    --  2.9* 2.8* 2.9*  MG  --  <0.5*  --   --  0.8* 1.9 1.8  CRP  --  4.7*  --   --  4.1* 2.4* 0.9  DDIMER  --  0.94*  --   --  1.02* 1.00* 1.01*  PROCALCITON  --  <0.10  --   --   --   --   --   LATICACIDVEN 1.1 0.9  --   --   --   --   --   INR 1.0  --   --   --   --   --   --   HGBA1C  --   --  6.2*  --   --   --   --   BNP 47.1  --   --  42.2  --   --   --           Radiology Studies: No results found.   Scheduled Meds:  albuterol  2 puff Inhalation Q6H   aspirin  81 mg Oral Daily   atorvastatin  80 mg Oral Daily   DULoxetine  60 mg Oral Daily   enoxaparin (LOVENOX) injection  40 mg Subcutaneous Q24H   insulin aspart  0-15 Units Subcutaneous TID WC   linagliptin  5 mg Oral Daily   loratadine  10 mg Oral Daily   LORazepam  1 mg Oral BID   methylPREDNISolone (SOLU-MEDROL) injection  60 mg Intravenous Q12H   metoprolol tartrate  25 mg Oral BID   mometasone-formoterol  2 puff Inhalation BID   pantoprazole  40 mg Oral BID   sacubitril-valsartan  1 tablet Oral BID   traZODone  50 mg Oral QHS   Vitamin D (Ergocalciferol)  50,000 Units Oral Weekly   Continuous Infusions:  remdesivir 100 mg in NS 100 mL 100 mg (03/14/21 0914)   Signature  Lala Lund M.D on 03/14/2021 at 10:14 AM   -  To page go to [email or web address]

## 2021-03-15 ENCOUNTER — Inpatient Hospital Stay (HOSPITAL_COMMUNITY): Payer: PPO

## 2021-03-15 ENCOUNTER — Ambulatory Visit: Payer: Medicare Other

## 2021-03-15 LAB — CBC WITH DIFFERENTIAL/PLATELET
Abs Immature Granulocytes: 0.36 10*3/uL — ABNORMAL HIGH (ref 0.00–0.07)
Basophils Absolute: 0 10*3/uL (ref 0.0–0.1)
Basophils Relative: 1 %
Eosinophils Absolute: 0 10*3/uL (ref 0.0–0.5)
Eosinophils Relative: 0 %
HCT: 32.6 % — ABNORMAL LOW (ref 36.0–46.0)
Hemoglobin: 10.5 g/dL — ABNORMAL LOW (ref 12.0–15.0)
Immature Granulocytes: 4 %
Lymphocytes Relative: 10 %
Lymphs Abs: 0.9 10*3/uL (ref 0.7–4.0)
MCH: 29.3 pg (ref 26.0–34.0)
MCHC: 32.2 g/dL (ref 30.0–36.0)
MCV: 91.1 fL (ref 80.0–100.0)
Monocytes Absolute: 0.2 10*3/uL (ref 0.1–1.0)
Monocytes Relative: 2 %
Neutro Abs: 7.4 10*3/uL (ref 1.7–7.7)
Neutrophils Relative %: 83 %
Platelets: 280 10*3/uL (ref 150–400)
RBC: 3.58 MIL/uL — ABNORMAL LOW (ref 3.87–5.11)
RDW: 12.7 % (ref 11.5–15.5)
WBC: 8.9 10*3/uL (ref 4.0–10.5)
nRBC: 0.2 % (ref 0.0–0.2)

## 2021-03-15 LAB — COMPREHENSIVE METABOLIC PANEL
ALT: 16 U/L (ref 0–44)
AST: 17 U/L (ref 15–41)
Albumin: 2.7 g/dL — ABNORMAL LOW (ref 3.5–5.0)
Alkaline Phosphatase: 66 U/L (ref 38–126)
Anion gap: 6 (ref 5–15)
BUN: 21 mg/dL (ref 8–23)
CO2: 25 mmol/L (ref 22–32)
Calcium: 9.6 mg/dL (ref 8.9–10.3)
Chloride: 104 mmol/L (ref 98–111)
Creatinine, Ser: 1.02 mg/dL — ABNORMAL HIGH (ref 0.44–1.00)
GFR, Estimated: 58 mL/min — ABNORMAL LOW (ref >60)
Glucose, Bld: 249 mg/dL — ABNORMAL HIGH (ref 70–99)
Potassium: 4.9 mmol/L (ref 3.5–5.1)
Sodium: 135 mmol/L (ref 135–145)
Total Bilirubin: 0.4 mg/dL (ref 0.3–1.2)
Total Protein: 6.1 g/dL — ABNORMAL LOW (ref 6.5–8.1)

## 2021-03-15 LAB — GLUCOSE, CAPILLARY
Glucose-Capillary: 143 mg/dL — ABNORMAL HIGH (ref 70–99)
Glucose-Capillary: 158 mg/dL — ABNORMAL HIGH (ref 70–99)
Glucose-Capillary: 199 mg/dL — ABNORMAL HIGH (ref 70–99)
Glucose-Capillary: 216 mg/dL — ABNORMAL HIGH (ref 70–99)

## 2021-03-15 LAB — C-REACTIVE PROTEIN: CRP: 0.7 mg/dL (ref <1.0)

## 2021-03-15 LAB — MAGNESIUM: Magnesium: 1.7 mg/dL (ref 1.7–2.4)

## 2021-03-15 LAB — BRAIN NATRIURETIC PEPTIDE: B Natriuretic Peptide: 64.3 pg/mL (ref 0.0–100.0)

## 2021-03-15 LAB — D-DIMER, QUANTITATIVE: D-Dimer, Quant: 0.83 ug/mL-FEU — ABNORMAL HIGH (ref 0.00–0.50)

## 2021-03-15 MED ORDER — GUAIFENESIN-DM 100-10 MG/5ML PO SYRP
10.0000 mL | ORAL_SOLUTION | ORAL | Status: DC | PRN
  Administered 2021-03-15: 10 mL via ORAL
  Filled 2021-03-15: qty 10

## 2021-03-15 NOTE — Evaluation (Signed)
Occupational Therapy Evaluation Patient Details Name: Nicole [name] MRN: [id number] DOB: 07/02/1945 Today's Date: 03/15/2021   History of Present Illness 76 y.o. female presented 03/11/21 with new onset of shortness of breath breathing for 2 days. COVID+; acute asthma exacerbation;    PMH significant of mild intermittent asthma, CAD with stenting, HTN/chronic diastolic CHF, IIDM   Clinical Impression   PTA Patient reports independent with ADLs, mobility using rollator. Reports history of falls and requires assist for IADL from daughter. Admitted for above and limited by problem list below, including impaired balance, decreased activity tolerance/weakness, and decreased safety awareness. Completing transfers, mobility in room using RW and ADLs with min guard. Reviewed fall prevention techniques and safety, pt with limited acceptance of recommendations (ie pulls on RW, continues to voice preference to be barefooted at home on wood floors).  Will follow acutely and with recommendations of HHOT services at dc to optimize safety with ADLs, mobility once dcd home.      Recommendations for follow up therapy are one component of a multi-disciplinary discharge planning process, led by the attending physician.  Recommendations may be updated based on patient status, additional functional criteria and insurance authorization.   Follow Up Recommendations  Home health OT    Assistance Recommended at Discharge Intermittent Supervision/Assistance  Patient can return home with the following A little help with walking and/or transfers;A little help with bathing/dressing/bathroom    Functional Status Assessment  Patient has had a recent decline in their functional status and demonstrates the ability to make significant improvements in function in a reasonable and predictable amount of time.  Equipment Recommendations  None recommended by OT    Recommendations for Other Services       Precautions /  Restrictions Precautions Precautions: Fall Precaution Comments: reports falls at home related to dizziness Restrictions Weight Bearing Restrictions: No      Mobility Bed Mobility Overal bed mobility: Modified Independent             General bed mobility comments: incr time    Transfers                          Balance Overall balance assessment: Needs assistance Sitting-balance support: No upper extremity supported;Feet supported Sitting balance-Leahy Scale: Good     Standing balance support: No upper extremity supported;During functional activity Standing balance-Leahy Scale: Fair Standing balance comment: standing statically with min guard but relies on BUE support dynaimcally                           ADL either performed or assessed with clinical judgement   ADL Overall ADL's : Needs assistance/impaired     Grooming: Min guard;Standing           Upper Body Dressing : Set up;Sitting   Lower Body Dressing: Min guard;Sit to/from stand   Toilet Transfer: Min guard;Ambulation;Rolling walker (2 wheels)   Toileting- Clothing Manipulation and Hygiene: Min guard;Sit to/from stand       Functional mobility during ADLs: Min guard;Rolling walker (2 wheels);Cueing for safety General ADL Comments: reviewed fall prevention techniques and safety     Vision Baseline Vision/History: 1 Wears glasses Ability to See in Adequate Light: 0 Adequate Vision Assessment?: No apparent visual deficits     Perception     Praxis      Pertinent Vitals/Pain Pain Assessment: No/denies pain     Hand Dominance Right   Extremity/Trunk  Assessment Upper Extremity Assessment Upper Extremity Assessment: Generalized weakness   Lower Extremity Assessment Lower Extremity Assessment: Defer to PT evaluation   Cervical / Trunk Assessment Cervical / Trunk Assessment: Normal   Communication Communication Communication: No difficulties   Cognition  Arousal/Alertness: Awake/alert Behavior During Therapy: WFL for tasks assessed/performed Overall Cognitive Status: No family/caregiver present to determine baseline cognitive functioning Area of Impairment: Attention;Awareness;Safety/judgement                   Current Attention Level: Sustained     Safety/Judgement: Decreased awareness of safety Awareness: Emergent   General Comments: anticipate baseline, reports decreased attention since inital covid infection 2 years ago.  decreased safety awareness and requires cueing for fall prevention (but hesistant to accept recommendations)     General Comments  VSS on RA    Exercises     Shoulder Instructions      Home Living Family/patient expects to be discharged to:: Private residence Living Arrangements: Children Available Help at Discharge: Family;Available 24 hours/day Type of Home: Mobile home Home Access: Ramped entrance     Home Layout: One level     Bathroom Shower/Tub: Occupational psychologist: Standard     Home Equipment: Rollator (4 wheels);Grab bars - tub/shower;BSC/3in1;Cane - single point   Additional Comments: Daughter has been staying with pt with no immediate plans to leave      Prior Functioning/Environment Prior Level of Function : Needs assist       Physical Assist : Mobility (physical);ADLs (physical) Mobility (physical): Gait ADLs (physical): Bathing;IADLs Mobility Comments: very dizzy in mornings since had COVID 2 years ago; uses rollator and progresses to cane if "a good day" ADLs Comments: has assist in shower, Daughter assists with IADLs. doesn't drive.        OT Problem List: Decreased activity tolerance;Impaired balance (sitting and/or standing);Decreased safety awareness;Decreased knowledge of use of DME or AE;Decreased knowledge of precautions;Decreased strength      OT Treatment/Interventions: Self-care/ADL training;Therapeutic exercise;DME and/or AE  instruction;Therapeutic activities;Balance training;Patient/family education    OT Goals(Current goals can be found in the care plan section) Acute Rehab OT Goals Patient Stated Goal: home OT Goal Formulation: With patient Time For Goal Achievement: 03/29/21 Potential to Achieve Goals: Good  OT Frequency: Min 2X/week    Co-evaluation              AM-PAC OT "6 Clicks" Daily Activity     Outcome Measure Help from another person eating meals?: None Help from another person taking care of personal grooming?: A Little Help from another person toileting, which includes using toliet, bedpan, or urinal?: A Little Help from another person bathing (including washing, rinsing, drying)?: A Little Help from another person to put on and taking off regular upper body clothing?: A Little Help from another person to put on and taking off regular lower body clothing?: A Little 6 Click Score: 19   End of Session Equipment Utilized During Treatment: Rolling walker (2 wheels) Nurse Communication: Mobility status  Activity Tolerance: Patient tolerated treatment well Patient left: in bed;with call bell/phone within reach  OT Visit Diagnosis: Other abnormalities of gait and mobility (R26.89);Muscle weakness (generalized) (M62.81)                Time: 9702-6378 OT Time Calculation (min): 23 min Charges:  OT General Charges $OT Visit: 1 Visit OT Evaluation $OT Eval Moderate Complexity: 1 Mod OT Treatments $Self Care/Home Management : 8-22 mins  Jolaine Artist, OT Acute Rehabilitation  Services Pager [phone] Office [place] 03/15/2021, 1:00 PM

## 2021-03-15 NOTE — Progress Notes (Signed)
Physical Therapy Treatment Patient Details Name: Nicole [name] MRN: [id number] DOB: [birth year] Today's Date: 03/15/2021   History of Present Illness 76 y.o. female presented 03/11/21 with new onset of shortness of breath breathing for 2 days. COVID+; acute asthma exacerbation;    PMH significant of mild intermittent asthma, CAD with stenting, HTN/chronic diastolic CHF, IIDM    PT Comments    Patient reported being up in room by herself (using RW). States she called for assistance and when no one came, she walked to bathroom herself. She reports MD told her she was going home tomorrow and wanted to see if she could safely manage. She did not keep her monitors plugged in and agreed to repeat ambulation during session to assess oxygen needs. (See separate note for ambulatory oxygen requirements). Overall, remains unsteady and encouraged her to continue to call for nursing assistance when she needs to get up.     Recommendations for follow up therapy are one component of a multi-disciplinary discharge planning process, led by the attending physician.  Recommendations may be updated based on patient status, additional functional criteria and insurance authorization.  Follow Up Recommendations  Home health PT (pt admitted to falls in home related to dizziness)     Assistance Recommended at Discharge PRN  Patient can return home with the following A little help with bathing/dressing/bathroom;A little help with walking and/or transfers;Help with stairs or ramp for entrance   Equipment Recommendations  None recommended by PT    Recommendations for Other Services       Precautions / Restrictions Precautions Precautions: Fall Precaution Comments: reports falls at home related to dizziness     Mobility  Bed Mobility Overal bed mobility: Modified Independent             General bed mobility comments: incr time    Transfers Overall transfer level: Needs assistance Equipment used:  Rolling walker (2 wheels) Transfers: Sit to/from Stand Sit to Stand: Min guard           General transfer comment: pt used to pulling up on rollator; vc for proper hand placement yet insists on hands on RW; close guarding for safety due to risk of tipping RW    Ambulation/Gait Ambulation/Gait assistance: Min guard Gait Distance (Feet): 15 Feet (toileted; 35 ft) Assistive device: Rolling walker (2 wheels) Gait Pattern/deviations: Step-through pattern;Decreased stride length;Wide base of support;Drifts right/left Gait velocity: decr     General Gait Details: slightly unsteady with minor drift to her left initially; recalled how to safely maneuver RW for washing hands at sink; vc for safest use of RW when walking through a narrow area in room (turning sideways)   Stairs             Wheelchair Mobility    Modified Rankin (Stroke Patients Only)       Balance Overall balance assessment: Needs assistance Sitting-balance support: No upper extremity supported;Feet supported Sitting balance-Leahy Scale: Good     Standing balance support: No upper extremity supported;During functional activity Standing balance-Leahy Scale: Fair Standing balance comment: standing statically with min guard but relies on BUE support dynaimcally                            Cognition Arousal/Alertness: Awake/alert Behavior During Therapy: WFL for tasks assessed/performed Overall Cognitive Status: No family/caregiver present to determine baseline cognitive functioning Area of Impairment: Attention;Awareness;Safety/judgement  Current Attention Level: Sustained     Safety/Judgement: Decreased awareness of safety Awareness: Emergent   General Comments: anticipate baseline, decreased safety awareness and requires cueing for fall prevention (but resists accepting recommendations)        Exercises      General Comments General comments (skin integrity,  edema, etc.): sats 98% on room air throughout session      Pertinent Vitals/Pain Pain Assessment: No/denies pain    Home Living Family/patient expects to be discharged to:: Private residence Living Arrangements: Children Available Help at Discharge: Family;Available 24 hours/day Type of Home: Mobile home Home Access: Ramped entrance       Home Layout: One level Home Equipment: Rollator (4 wheels);Grab bars - tub/shower;BSC/3in1;Cane - single point Additional Comments: Daughter has been staying with pt with no immediate plans to leave    Prior Function            PT Goals (current goals can now be found in the care plan section) Acute Rehab PT Goals Patient Stated Goal: keep strength up while hospitalized Time For Goal Achievement: 03/27/21 Potential to Achieve Goals: Good Progress towards PT goals: Progressing toward goals    Frequency    Min 3X/week      PT Plan Current plan remains appropriate    Co-evaluation              AM-PAC PT "6 Clicks" Mobility   Outcome Measure  Help needed turning from your back to your side while in a flat bed without using bedrails?: None Help needed moving from lying on your back to sitting on the side of a flat bed without using bedrails?: None Help needed moving to and from a bed to a chair (including a wheelchair)?: A Little Help needed standing up from a chair using your arms (e.g., wheelchair or bedside chair)?: A Little Help needed to walk in hospital room?: A Little Help needed climbing 3-5 steps with a railing? : A Little 6 Click Score: 20    End of Session   Activity Tolerance: Patient tolerated treatment well Patient left: with call bell/phone within reach;in chair;with nursing/sitter in room Nurse Communication: Mobility status;Other (comment) (ambulatory sats and left on room air) PT Visit Diagnosis: Unsteadiness on feet (R26.81)     Time: 4818-5631 PT Time Calculation (min) (ACUTE ONLY): 31  min  Charges:  $Gait Training: 8-22 mins $Self Care/Home Management: [place], [place]  Pager [phone] Office [phone]    Rexanne Mano 03/15/2021, 4:31 PM

## 2021-03-15 NOTE — TOC Initial Note (Signed)
Transition of Care [facility]) - Initial/Assessment Note    Patient Details  Name: Nicole [name] MRN: [id number] Date of Birth: 1946/02/10  Transition of Care [facility]) CM/SW Contact:    Cyndi Bender, RN Phone Number: 03/15/2021, 2:27 PM  Clinical Narrative:                 Spoke to patient regarding transition needs.Orders for home health PT/OT. Patient has used [facility] in the past and would like to use them again. Spoke to Amy with [facility] and referral accepted for start of service Monday 03/19/21 Patient's daughter is staying with her for now. Patient already has a walker. Daughter can transport patient home once discharged.  Address, Phone number and PCP verified.    Expected Discharge Plan: [place] Barriers to Discharge: Continued Medical Work up   Patient Goals and CMS Choice Patient states their goals for this hospitalization and ongoing recovery are:: return home CMS Medicare.gov Compare Post Acute Care list provided to:: Patient Choice offered to / list presented to : Patient  Expected Discharge Plan and Services Expected Discharge Plan: [place]   Discharge Planning Services: CM Consult Post Acute Care Choice: [place] arrangements for the past 2 months: Single Family Home                           HH Arranged: PT, OT HH Agency: [place] Date [place]: 03/15/21 Time HH Agency Contacted: 90 Representative spoke with at [place]: [place] Arrangements/Services Living arrangements for the past 2 months: [place] with:: Adult Children Patient language and need for interpreter reviewed:: Yes Do you feel safe going back to the place where you live?: Yes      Need for Family Participation in Patient Care: Yes (Comment) Care giver support system in place?: Yes (comment)   Criminal Activity/Legal Involvement Pertinent to Current Situation/Hospitalization: No - Comment as  needed  Activities of Daily Living Home Assistive Devices/Equipment: Cane (specify quad or straight), Walker (specify type), Shower chair with back, Raised toilet seat with rails ADL Screening (condition at time of admission) Patient's cognitive ability adequate to safely complete daily activities?: Yes Is the patient deaf or have difficulty hearing?: No Does the patient have difficulty seeing, even when wearing glasses/contacts?: No Does the patient have difficulty concentrating, remembering, or making decisions?: Yes Patient able to express need for assistance with ADLs?: Yes Does the patient have difficulty dressing or bathing?: No Independently performs ADLs?: Yes (appropriate for developmental age) Does the patient have difficulty walking or climbing stairs?: Yes Weakness of Legs: Both Weakness of Arms/Hands: Both  Permission Sought/Granted Permission sought to share information with : Facility Art therapist granted to share information with : Yes, Verbal Permission Granted     Permission granted to share info w AGENCY: home health        Emotional Assessment Appearance:: Appears stated age Attitude/Demeanor/Rapport: Engaged Affect (typically observed): Accepting Orientation: : Oriented to Self, Oriented to Place, Oriented to  Time, Oriented to Situation Alcohol / Substance Use: Not Applicable Psych Involvement: No (comment)  Admission diagnosis:  Dyspnea, unspecified type [R06.00] COVID [U07.1] COVID-19 [U07.1] Patient Active Problem List   Diagnosis Date Noted   COVID 03/11/2021   Abnormal gait 03/02/2021   Iron deficiency anemia 03/02/2021   Ischemic cardiomyopathy 03/02/2021   AKI (acute kidney injury) ([place]) 08/27/2020   Failure to  thrive in adult 08/27/2020   Sepsis secondary to UTI ([place]) 01/11/2020   Sepsis ([place]) 01/10/2020   Non-ST elevation (NSTEMI) myocardial infarction [facility])    Acute respiratory failure with hypoxia ([place])     Benzodiazepine withdrawal with complication ([place])    Severe sepsis ([place]) 12/12/2019   Hyperammonemia ([place]) 12/12/2019   Chest pain in adult 12/12/2019   Nystagmus 12/11/2019   Leukocytosis 12/11/2019   Chronic combined systolic and diastolic congestive heart failure ([place]) 12/11/2019   Type 2 diabetes mellitus ([place]) 12/11/2019   Acute combined systolic and diastolic congestive heart failure ([place]) 10/22/2019   CAD S/P percutaneous coronary angioplasty 10/22/2019   Aphasia 10/21/2019   Elevated troponin    Cardiomyopathy (H[facility])    DOE (dyspnea on exertion) 10/16/2019   Mild intermittent asthma with (acute) exacerbation 06/24/2019   COVID-19 virus infection 68/02/7516   Acute metabolic encephalopathy 00/17/4944   Hypokalemia, inadequate intake 06/24/2019   Hypomagnesemia 06/24/2019   GERD with esophagitis    Major depressive disorder    Mixed diabetic hyperlipidemia associated with type 2 diabetes mellitus ([place])    Chronic daily headache 12/07/2014   Memory loss 08/31/2014   Worsening headaches 08/31/2014   Mixed bipolar I disorder ([place]) 05/24/2014   Atypical chest pain 11/05/2013   Anemia    Anxiety    Depression    Diabetes mellitus type 2 in obese ([place])    Urinary incontinence    Endometriosis    Asthma    Fibromyalgia    Vitamin D deficiency 10/23/2009   Migraine 01/25/2009   PCP:  [name], MD Pharmacy:   [facility] [place], [place] [address] [facility] [address] [place] [address] Phone: [phone] Fax: [phone]     Social Determinants of Health (SDOH) Interventions    Readmission Risk Interventions Readmission Risk Prevention Plan 08/29/2020 10/22/2019  Transportation Screening Complete (No Data)  PCP or Specialist Appt within 3-5 Days - Complete  HRI or [place] - Complete  Social Work Consult for [place] Planning/Counseling - Complete  Palliative Care Screening - Not Applicable  Medication  Review Press photographer) Complete Complete  PCP or Specialist appointment within 3-5 days of discharge Complete -  [place] or Home Care Consult Complete -  SW Recovery Care/Counseling Consult Complete -  Palliative Care Screening Complete -  [place] Not Applicable -  Some recent data might be hidden

## 2021-03-15 NOTE — Progress Notes (Addendum)
Patient ID: Nicole [name], female   DOB: November 15, 1945, 76 y.o.   MRN: [id number]  PROGRESS NOTE    CHELSY PARRALES  TJQ:[id number] DOB: Oct 04, 1945 DOA: 03/11/2021 PCP: [name], MD   Brief Narrative:  76 y.o. female with medical history significant of mild intermittent asthma, CAD with stenting, HTN/chronic diastolic CHF, diabetes mellitus type 2 presented with worsening shortness of breath.  She tested positive for COVID on 03/09/2021 at home.  On presentation to the ED, she was tachypneic, initially requiring 2 L oxygen by nasal cannula.  COVID test was positive.  She was started on remdesivir and steroids.    Subjective:  Patient in bed, appears comfortable, denies any headache, no fever, no chest pain or pressure, improved wheezing & shortness of breath , no abdominal pain. No new focal weakness.   Assessment & Plan:    Acute COVID-19 infection  causing mild COVID-19 pneumonia and acute on chronic asthma exacerbation -  Tested positive at home on 03/09/2021.  She has been placed on IV steroids with improvement, inflammatory markers are trending down, clinically stable.  Advance activity.  Still has some wheezing.  Taper to oral steroids in the next 1 to 2 days supportive care with nebulizer treatments and oxygen as needed.  CAD with history of cardiac stent.  On aspirin, statin and beta-blocker for secondary prevention.  Continue with outpatient cardiology and PCP follow-up  Chronic diastolic CHF EF 76% on echocardiogram in June 2022 - Currently euvolemic.  Continue metoprolol and Entresto.  Strict input and output.  Daily weights.  Anemia of chronic disease  - Possibly from chronic illnesses.  Hemoglobin stable.  Weakness and deconditioning.  Continue PT OT.  Still has poor balance, HHPT  Hypertension.  Stable on combination of Entresto and beta-blocker.  Diabetes mellitus type 2 - A1c 6.2.  Continue CBGs with SSI.  Continue linagliptin.  Hold metformin  Lab Results  Component  Value Date   HGBA1C 6.2 (H) 03/11/2021    CBG (last 3)  Recent Labs    03/14/21 1614 03/14/21 2054 03/15/21 0832  GLUCAP 100* 229* 143*      DVT prophylaxis: Lovenox Code Status: Full Family Communication: None at bedside Disposition Plan: Status is: Inpatient  Remains inpatient appropriate because: Of severity of illness and need for IV steroids and remdesivir  Consultants: None  Procedures: None  Antimicrobials: None  Objective: Vitals:   03/15/21 0000 03/15/21 0451 03/15/21 0818 03/15/21 0827  BP: (!) 124/52 122/71  (!) 127/55  Pulse: 70 65  60  Resp: 20 17  18   Temp: 98.1 F (36.7 C) 98 F (36.7 C)  98.5 F (36.9 C)  TempSrc: Oral Oral  Oral  SpO2: 96% 91% 92% 91%  Weight:  (!) 161.4 kg    Height:        Intake/Output Summary (Last 24 hours) at 03/15/2021 0947 Last data filed at 03/15/2021 0700 Gross per 24 hour  Intake 440 ml  Output 2040 ml  Net -1600 ml   Filed Weights   03/13/21 0500 03/14/21 0500 03/15/21 0451  Weight: 75.5 kg 75.5 kg (!) 161.4 kg    Examination:  Awake Alert, No new F.N deficits, Normal affect [place].AT,PERRAL Supple Neck, No JVD,   Symmetrical Chest wall movement, Good air movement bilaterally, minimal wheezing RRR,No Gallops, Rubs or new Murmurs,  +ve B.Sounds, Abd Soft, No tenderness,   No Cyanosis, Clubbing or edema      Data Reviewed: I have personally reviewed following labs and  imaging studies  Recent Labs  Lab 03/11/21 1518 03/12/21 0608 03/13/21 0134 03/14/21 0116 03/15/21 0108  WBC 9.1 8.3 7.9 10.1 8.9  HGB 11.9* 10.9* 9.7* 10.2* 10.5*  HCT 34.8* 31.6* 29.5* 30.8* 32.6*  PLT 242 227 229 227 280  MCV 87.4 87.5 88.9 89.3 91.1  MCH 29.9 30.2 29.2 29.6 29.3  MCHC 34.2 34.5 32.9 33.1 32.2  RDW 13.1 13.0 13.0 13.0 12.7  LYMPHSABS 1.7 0.9 0.8 1.0 0.9  MONOABS 0.6 0.2 0.1 0.3 0.2  EOSABS 0.0 0.0 0.0 0.0 0.0  BASOSABS 0.0 0.0 0.0 0.0 0.0    Recent Labs  Lab 03/11/21 1518 03/11/21 1700 03/11/21 1732  03/11/21 1733 03/12/21 0608 03/13/21 0134 03/14/21 0116 03/15/21 0108  NA 142  --   --   --  139 138 137 135  K 3.0*  --   --   --  3.0* 4.1 4.8 4.9  CL 105  --   --   --  104 107 106 104  CO2 25  --   --   --  24 22 23 25   GLUCOSE 152*  --   --   --  203* 206* 147* 249*  BUN 7*  --   --   --  12 20 20 21   CREATININE 0.92  --   --   --  0.84 0.88 0.95 1.02*  CALCIUM 7.7*  --   --   --  7.3* 7.8* 9.2 9.6  AST 19  --   --   --  18 17 14* 17  ALT 18  --   --   --  17 17 15 16   ALKPHOS 77  --   --   --  72 67 65 66  BILITOT 0.8  --   --   --  0.6 0.7 0.5 0.4  ALBUMIN 3.2*  --   --   --  2.9* 2.8* 2.9* 2.7*  MG  --  <0.5*  --   --  0.8* 1.9 1.8 1.7  CRP  --  4.7*  --   --  4.1* 2.4* 0.9 0.7  DDIMER  --  0.94*  --   --  1.02* 1.00* 1.01* 0.83*  PROCALCITON  --  <0.10  --   --   --   --   --   --   LATICACIDVEN 1.1 0.9  --   --   --   --   --   --   INR 1.0  --   --   --   --   --   --   --   HGBA1C  --   --  6.2*  --   --   --   --   --   BNP 47.1  --   --  42.2  --   --  82.2 64.3      Radiology Studies: No results found.   Scheduled Meds:  albuterol  2 puff Inhalation Q6H   aspirin  81 mg Oral Daily   atorvastatin  80 mg Oral Daily   DULoxetine  60 mg Oral Daily   enoxaparin (LOVENOX) injection  40 mg Subcutaneous Q24H   insulin aspart  0-15 Units Subcutaneous TID WC   linagliptin  5 mg Oral Daily   loratadine  10 mg Oral Daily   LORazepam  1 mg Oral BID   methylPREDNISolone (SOLU-MEDROL) injection  60 mg Intravenous Q12H   metoprolol tartrate  25 mg Oral  BID   mometasone-formoterol  2 puff Inhalation BID   pantoprazole  40 mg Oral BID   sacubitril-valsartan  1 tablet Oral BID   traZODone  50 mg Oral QHS   Vitamin D (Ergocalciferol)  50,000 Units Oral Weekly   Continuous Infusions:   Signature  Lala Lund M.D on 03/15/2021 at 9:47 AM   -  To page go to [email or web address]

## 2021-03-15 NOTE — Progress Notes (Signed)
SATURATION QUALIFICATIONS: (This note is used to comply with regulatory documentation for home oxygen)  Patient Saturations on Room Air at Rest = 98%  Patient Saturations on Room Air while Ambulating = 98%    Patient did not require oxygen while ambulating.    Arby Barrette, PT Acute Rehabilitation Services  Pager [phone] Office [phone]

## 2021-03-16 ENCOUNTER — Other Ambulatory Visit (HOSPITAL_COMMUNITY): Payer: Self-pay

## 2021-03-16 LAB — COMPREHENSIVE METABOLIC PANEL
ALT: 20 U/L (ref 0–44)
AST: 15 U/L (ref 15–41)
Albumin: 2.9 g/dL — ABNORMAL LOW (ref 3.5–5.0)
Alkaline Phosphatase: 62 U/L (ref 38–126)
Anion gap: 7 (ref 5–15)
BUN: 21 mg/dL (ref 8–23)
CO2: 27 mmol/L (ref 22–32)
Calcium: 9.6 mg/dL (ref 8.9–10.3)
Chloride: 102 mmol/L (ref 98–111)
Creatinine, Ser: 1.17 mg/dL — ABNORMAL HIGH (ref 0.44–1.00)
GFR, Estimated: 49 mL/min — ABNORMAL LOW (ref >60)
Glucose, Bld: 242 mg/dL — ABNORMAL HIGH (ref 70–99)
Potassium: 4.7 mmol/L (ref 3.5–5.1)
Sodium: 136 mmol/L (ref 135–145)
Total Bilirubin: 0.5 mg/dL (ref 0.3–1.2)
Total Protein: 6.1 g/dL — ABNORMAL LOW (ref 6.5–8.1)

## 2021-03-16 LAB — CULTURE, BLOOD (ROUTINE X 2)
Culture: NO GROWTH
Culture: NO GROWTH
Special Requests: ADEQUATE

## 2021-03-16 LAB — GLUCOSE, CAPILLARY
Glucose-Capillary: 190 mg/dL — ABNORMAL HIGH (ref 70–99)
Glucose-Capillary: 155 mg/dL — ABNORMAL HIGH (ref 70–99)

## 2021-03-16 LAB — CBC WITH DIFFERENTIAL/PLATELET
Abs Immature Granulocytes: 0.43 10*3/uL — ABNORMAL HIGH (ref 0.00–0.07)
Basophils Absolute: 0.1 10*3/uL (ref 0.0–0.1)
Basophils Relative: 1 %
Eosinophils Absolute: 0 10*3/uL (ref 0.0–0.5)
Eosinophils Relative: 0 %
HCT: 33.4 % — ABNORMAL LOW (ref 36.0–46.0)
Hemoglobin: 10.8 g/dL — ABNORMAL LOW (ref 12.0–15.0)
Immature Granulocytes: 5 %
Lymphocytes Relative: 13 %
Lymphs Abs: 1.1 10*3/uL (ref 0.7–4.0)
MCH: 29 pg (ref 26.0–34.0)
MCHC: 32.3 g/dL (ref 30.0–36.0)
MCV: 89.8 fL (ref 80.0–100.0)
Monocytes Absolute: 0.2 10*3/uL (ref 0.1–1.0)
Monocytes Relative: 3 %
Neutro Abs: 6.2 10*3/uL (ref 1.7–7.7)
Neutrophils Relative %: 78 %
Platelets: 282 10*3/uL (ref 150–400)
RBC: 3.72 MIL/uL — ABNORMAL LOW (ref 3.87–5.11)
RDW: 12.7 % (ref 11.5–15.5)
WBC: 8 10*3/uL (ref 4.0–10.5)
nRBC: 0.3 % — ABNORMAL HIGH (ref 0.0–0.2)

## 2021-03-16 LAB — MAGNESIUM: Magnesium: 1.5 mg/dL — ABNORMAL LOW (ref 1.7–2.4)

## 2021-03-16 LAB — BRAIN NATRIURETIC PEPTIDE: B Natriuretic Peptide: 67.4 pg/mL (ref 0.0–100.0)

## 2021-03-16 LAB — C-REACTIVE PROTEIN: CRP: 0.6 mg/dL (ref <1.0)

## 2021-03-16 LAB — D-DIMER, QUANTITATIVE: D-Dimer, Quant: 0.81 ug/mL-FEU — ABNORMAL HIGH (ref 0.00–0.50)

## 2021-03-16 MED ORDER — MAGNESIUM SULFATE 2 GM/50ML IV SOLN
2.0000 g | Freq: Once | INTRAVENOUS | Status: AC
  Administered 2021-03-16: 2 g via INTRAVENOUS
  Filled 2021-03-16: qty 50

## 2021-03-16 MED ORDER — PREDNISONE 5 MG PO TABS
ORAL_TABLET | ORAL | 0 refills | Status: DC
  Filled 2021-03-16: qty 65, 18d supply, fill #0

## 2021-03-16 MED ORDER — ALBUTEROL SULFATE HFA 108 (90 BASE) MCG/ACT IN AERS
2.0000 | INHALATION_SPRAY | Freq: Two times a day (BID) | RESPIRATORY_TRACT | Status: DC
  Filled 2021-03-16: qty 6.7

## 2021-03-16 MED ORDER — ALBUTEROL SULFATE (2.5 MG/3ML) 0.083% IN NEBU
2.5000 mg | INHALATION_SOLUTION | Freq: Four times a day (QID) | RESPIRATORY_TRACT | 2 refills | Status: DC | PRN
  Filled 2021-03-16: qty 90, 8d supply, fill #0

## 2021-03-16 MED ORDER — IPRATROPIUM-ALBUTEROL 0.5-2.5 (3) MG/3ML IN SOLN
3.0000 mL | Freq: Two times a day (BID) | RESPIRATORY_TRACT | 0 refills | Status: DC
  Filled 2021-03-16: qty 180, 15d supply, fill #0

## 2021-03-16 NOTE — Discharge Summary (Signed)
Nicole [name] XBL:[id number] DOB: 07-21-1945 DOA: 03/11/2021  PCP: [name], MD  Admit date: 03/11/2021  Discharge date: 03/16/2021  Admitted From: Home   Disposition:  Home   Recommendations for Outpatient Follow-up:   Follow up with PCP in 1-2 weeks  PCP Please obtain BMP/CBC, 2 view CXR in 1week,  (see Discharge instructions)   PCP Please follow up on the following pending results:    Home Health: PT,OT   Equipment/Devices: as below  Consultations: None  Discharge Condition: Stable    CODE STATUS: Full    Diet Recommendation: Heart Healthy Low Carb    Chief Complaint  Patient presents with   Shortness of Breath   Weakness     Brief history of present illness from the day of admission and additional interim summary    76 y.o. female with medical history significant of mild intermittent asthma, CAD with stenting, HTN/chronic diastolic CHF, diabetes mellitus type 2 presented with worsening shortness of breath.  She tested positive for COVID on 03/09/2021 at home.  On presentation to the ED, she was tachypneic, initially requiring 2 L oxygen by nasal cannula.  COVID test was positive.  She was started on remdesivir and steroids.                                                                 Hospital Course    Acute COVID-19 infection  causing mild COVID-19 pneumonia and acute on chronic asthma exacerbation -  Tested positive at home on 03/09/2021.  She has been placed on IV steroids with improvement, inflammatory markers are trending down, clinically stable.  Now no oxygen requirement at rest but does require 2 L of oxygen upon prolonged ambulation which she will get at home, will be discharged home on oral steroid taper with outpatient PCP follow-up.  We will also get nebulizer treatments and a nebulizer  machine for home use.   CAD with history of cardiac stent.  On aspirin, statin and beta-blocker for secondary prevention.  Continue with outpatient cardiology and PCP follow-up   Chronic diastolic CHF EF 30% on echocardiogram in June 2022 - Currently euvolemic.  Continue metoprolol and Entresto.  Strict input and output.  No acute issues.  Anemia of chronic disease  - Possibly from chronic illnesses.  Hemoglobin stable.  PCP to monitor outpatient.   Weakness and deconditioning.  Home health PT ordered.   Hypertension.  Stable on combination of Entresto and beta-blocker.   Hypomagnesemia.  Replaced.  PCP to monitor.    Diabetes mellitus type 2 - A1c 6.2.  Continue home regimen and follow-up with PCP postdischarge.  Discharge diagnosis     Principal Problem:   COVID Active Problems:   COVID-19 virus infection    Discharge instructions    Discharge Instructions  For home use only DME Nebulizer machine   Complete by: As directed    Patient needs a nebulizer to treat with the following condition: COPD (chronic obstructive pulmonary disease) ([place])   Length of Need: 6 Months       Discharge Medications   Allergies as of 03/16/2021       Reactions   Fish-derived Products Anaphylaxis   Shellfish Allergy Shortness Of Breath   In dye for scan.   Aspirin Nausea Only, Other (See Comments)   Pt stated it only gives her problems when she takes the high dose (325 mg)   Benadryl [diphenhydramine]    "flips out with it. Eyes roll to the back of her head."   Ivp Dye [iodinated Contrast Media]    Hx of ? anaphlaxis   Latex Other (See Comments)   Pt says she gets a "stinky infection"        Medication List     TAKE these medications    aspirin 81 MG tablet Take 81 mg by mouth daily.   atorvastatin 80 MG tablet Commonly known as: LIPITOR TAKE 1 TABLET(80 MG) BY MOUTH AT BEDTIME What changed: See the new instructions.   DULoxetine 60 MG capsule Commonly known as:  CYMBALTA Take 60 mg by mouth daily.   Entresto 97-103 MG Generic drug: sacubitril-valsartan TAKE 1 TABLET BY MOUTH TWICE DAILY What changed: how to take this   ergocalciferol 1.25 MG (50000 UT) capsule Commonly known as: VITAMIN D2 Take 50,000 Units by mouth once a week.   fluticasone 50 MCG/ACT nasal spray Commonly known as: FLONASE Place 1 spray into both nostrils daily as needed for allergies or rhinitis.   Fluticasone-Salmeterol 250-50 MCG/DOSE Aepb Commonly known as: ADVAIR Inhale 1 puff into the lungs 2 (two) times daily.   furosemide 40 MG tablet Commonly known as: LASIX Take 0.5 tablets (20 mg total) by mouth daily as needed for edema or fluid (Please take 20mg  Daily as needed for swelling, or weight gain.). What changed: reasons to take this   ipratropium-albuterol 0.5-2.5 (3) MG/3ML Soln Commonly known as: DUONEB Take 3 mLs by nebulization 2 (two) times daily. Use twice a day scheduled   Janumet 50-500 MG tablet Generic drug: sitaGLIPtin-metformin Take 1 tablet by mouth daily.   loratadine 10 MG tablet Commonly known as: CLARITIN Take 1 tablet (10 mg total) by mouth daily.   LORazepam 1 MG tablet Commonly known as: ATIVAN Take 1 tablet (1 mg total) by mouth 2 (two) times daily.   meclizine 25 MG tablet Commonly known as: ANTIVERT Take 25 mg by mouth daily as needed for dizziness.   metoprolol succinate 50 MG 24 hr tablet Commonly known as: TOPROL-XL TAKE 1 TABLET(50 MG) BY MOUTH DAILY WITH OR IMMEDIATELY FOLLOWING A MEAL What changed: See the new instructions.   OneTouch Delica Lancets 40X Misc   OneTouch Verio test strip Generic drug: glucose blood   oxyCODONE-acetaminophen 10-325 MG tablet Commonly known as: PERCOCET Take 1 tablet by mouth 2 (two) times daily as needed for pain.   pantoprazole 40 MG tablet Commonly known as: PROTONIX Take 40 mg by mouth 2 (two) times daily.   potassium chloride SA 20 MEQ tablet Commonly known as: KLOR-CON  M TAKE 2 TABLET BY MOUTH DAILY What changed:  how much to take how to take this   predniSONE 5 MG tablet Commonly known as: DELTASONE Label  & dispense according to the schedule below. take 8 Pills PO for 3 days, 6 Pills  PO for 3 days, 4 Pills PO for 3 days, 2 Pills PO for 3 days, 1 Pills PO for 3 days, 1/2 Pill  PO for 3 days then STOP. Total 65 pills.   ProAir HFA 108 (90 Base) MCG/ACT inhaler Generic drug: albuterol Inhale 2 puffs into the lungs every 4 (four) hours as needed for wheezing or shortness of breath. Uses as rescue inhaler Reported on 06/20/2015 What changed: Another medication with the same name was added. Make sure you understand how and when to take each.   albuterol (2.5 MG/3ML) 0.083% nebulizer solution Commonly known as: PROVENTIL Take 3 mLs (2.5 mg total) by nebulization every 6 (six) hours as needed for wheezing or shortness of breath. What changed: You were already taking a medication with the same name, and this prescription was added. Make sure you understand how and when to take each.   promethazine 12.5 MG tablet Commonly known as: PHENERGAN Take 1 tablet (12.5 mg total) by mouth every 6 (six) hours as needed for nausea.   traZODone 50 MG tablet Commonly known as: DESYREL Take 50 mg by mouth at bedtime.               Durable Medical Equipment  (From admission, onward)           Start     Ordered   03/16/21 0958  For home use only DME oxygen  Once       Question Answer Comment  Length of Need 6 Months   Mode or (Route) Nasal cannula   Liters per Minute 2   Frequency Continuous (stationary and portable oxygen unit needed)   Oxygen conserving device Yes   Oxygen delivery system Gas      03/16/21 0957   03/16/21 0000  For home use only DME Nebulizer machine       Question Answer Comment  Patient needs a nebulizer to treat with the following condition COPD (chronic obstructive pulmonary disease) ([place])   Length of Need 6 Months       03/16/21 1004             Follow-up Information     [facility] Follow up.   Specialty: Home Health Services Why: HH-PT/OT arranged. They will call you to scheudul an apt for Monday Contact information: [place] [address] [phone]         [name], MD. Schedule an appointment as soon as possible for a visit in 1 week(s).   Specialty: Endocrinology Contact information: [place] [address] [phone]         [place], Peter M, MD .   Specialty: Cardiology Contact information: [address] [place] [address] [phone]                 Major procedures and Radiology Reports - PLEASE review detailed and final reports thoroughly  -       DG Chest Port 1 View  Result Date: 03/15/2021 CLINICAL DATA:  Shortness of breath, COVID positive EXAM: PORTABLE CHEST 1 VIEW COMPARISON:  03/11/2021 FINDINGS: Transverse diameter of heart is increased. There are no signs of pulmonary edema. Small transverse linear densities seen in the lower lung fields, more so on the left side. There is no focal pulmonary consolidation. There is minimal blunting of left lateral CP angle. There is no pneumothorax. There is soft tissue fullness in the retrocardiac region which may be due to fixed hiatal hernia or tortuous  and ectatic thoracic aorta. IMPRESSION: There are small linear densities in the lower lung fields, more so on the left side with interval worsening suggesting subsegmental atelectasis. Possible minimal left pleural effusion. Electronically Signed   By: Elmer Picker M.D.   On: 03/15/2021 10:35   DG Chest Port 1 View  Result Date: 03/11/2021 CLINICAL DATA:  Dyspnea. Positive home COVID test 2 days ago. Weakness and shortness of breath. Chest pain. EXAM: PORTABLE CHEST 1 VIEW COMPARISON:  Two-view chest x-ray 08/27/2020 FINDINGS: Heart is mildly enlarged. Moderate-sized hiatal hernia is again noted.  Mild bibasilar airspace opacities are present. The lungs are otherwise clear. Left effusion is suspected. IMPRESSION: 1. Mild bibasilar airspace disease. While this likely reflects atelectasis, early infection is not excluded. 2. Probable small left pleural effusion. Electronically Signed   By: [place] M.D.   On: 03/11/2021 15:42    Today   Subjective    Nicole [name] today has no headache,no chest abdominal pain,no new weakness tingling or numbness, feels much better wants to go home today.     Objective   Blood pressure 139/77, pulse 71, temperature 98 F (36.7 C), temperature source Oral, resp. rate 20, height 5\' 2"  (1.575 m), weight 73.3 kg, SpO2 94 %.   Intake/Output Summary (Last 24 hours) at 03/16/2021 1004 Last data filed at 03/15/2021 1145 Gross per 24 hour  Intake --  Output 340 ml  Net -340 ml    Exam  Awake Alert, No new F.N deficits, Normal affect [place].AT,PERRAL Supple Neck,No JVD, No cervical lymphadenopathy appriciated.  Symmetrical Chest wall movement, Good air movement bilaterally, CTAB RRR,No Gallops,Rubs or new Murmurs, No Parasternal Heave +ve B.Sounds, Abd Soft, Non tender, No organomegaly appriciated, No rebound -guarding or rigidity. No Cyanosis, Clubbing or edema, No new Rash or bruise   Data Review   CBC w Diff:  Lab Results  Component Value Date   WBC 8.0 03/16/2021   HGB 10.8 (L) 03/16/2021   HCT 33.4 (L) 03/16/2021   PLT 282 03/16/2021   LYMPHOPCT 13 03/16/2021   MONOPCT 3 03/16/2021   EOSPCT 0 03/16/2021   BASOPCT 1 03/16/2021    CMP:  Lab Results  Component Value Date   NA 136 03/16/2021   NA 142 04/17/2020   K 4.7 03/16/2021   CL 102 03/16/2021   CO2 27 03/16/2021   BUN 21 03/16/2021   BUN 22 04/17/2020   CREATININE 1.17 (H) 03/16/2021   PROT 6.1 (L) 03/16/2021   ALBUMIN 2.9 (L) 03/16/2021   BILITOT 0.5 03/16/2021   ALKPHOS 62 03/16/2021   AST 15 03/16/2021   ALT 20 03/16/2021  .   Total Time in preparing paper  work, data evaluation and todays exam - 33 minutes  Lala Lund M.D on 03/16/2021 at 10:04 AM  [facility]

## 2021-03-16 NOTE — Discharge Instructions (Signed)
Follow with Primary MD [name], MD in 7 days   Get CBC, CMP, magnesium, 2 view Chest X ray -  checked next visit within 1 week by Primary MD    Activity: As tolerated with Full fall precautions use walker/cane & assistance as needed  Disposition Home   Diet: Heart Healthy low carbohydrate diet  Special Instructions: If you have smoked or chewed Tobacco  in the last 2 yrs please stop smoking, stop any regular Alcohol  and or any Recreational drug use.  On your next visit with your primary care physician please Get Medicines reviewed and adjusted.  Please request your Prim.MD to go over all Hospital Tests and Procedure/Radiological results at the follow up, please get all Hospital records sent to your Prim MD by signing hospital release before you go home.  If you experience worsening of your admission symptoms, develop shortness of breath, life threatening emergency, suicidal or homicidal thoughts you must seek medical attention immediately by calling 911 or calling your MD immediately  if symptoms less severe.  You Must read complete instructions/literature along with all the possible adverse reactions/side effects for all the Medicines you take and that have been prescribed to you. Take any new Medicines after you have completely understood and accpet all the possible adverse reactions/side effects.

## 2021-03-16 NOTE — Progress Notes (Signed)
Pt's daughter [name] has arrived downstairs to transport pt home via private vehicle. Pt alert and oriented x4 in no acute distress upon discharge. Discharge paperwork reviewed with pt. Instructions on home O2 also reviewed with pt. Pt verbalized understanding. Pt has gathered all of her belongings including take home meds from [facility] and home O2 with her.

## 2021-03-16 NOTE — Progress Notes (Signed)
SATURATION QUALIFICATIONS: (This note is used to comply with regulatory documentation for home oxygen)  Patient Saturations on Room Air at Rest = 96%  Patient Saturations on Room Air while Ambulating = 86%  Patient Saturations on 2 Liters of oxygen while Ambulating = 96%  Please briefly explain why patient needs home oxygen:

## 2021-03-16 NOTE — TOC Transition Note (Signed)
Transition of Care [facility]) - CM/SW Discharge Note   Patient Details  Name: Nicole [name] MRN: [id number] Date of Birth: 09-Sep-1945  Transition of Care [facility]) CM/SW Contact:  Pollie Friar, RN Phone Number: 03/16/2021, 10:28 AM   Clinical Narrative:    Patient discharging home today with home health services through [place]. Information on the AVS. Pt qualified for home oxygen today. CM attempted to reach the patient via phone and was not able. CM spoke to patients daughter over the phone and she had no preference on the oxygen company. CM has arranged home oxygen through [employer]. They will deliver a tank to the room and set up concentrator at the home.  Pt has transportation home.    Final next level of care: [place] Barriers to Discharge: No Barriers Identified   Patient Goals and CMS Choice Patient states their goals for this hospitalization and ongoing recovery are:: return home CMS Medicare.gov Compare Post Acute Care list provided to:: Patient Represenative (must comment) Choice offered to / list presented to : Adult Children  Discharge Placement                       Discharge Plan and Services   Discharge Planning Services: CM Consult Post Acute Care Choice: Home Health          DME Arranged: Oxygen DME Agency: [employer] Date DME Agency Contacted: 03/16/21   Representative spoke with at DME Agency: Freda Munro HH Arranged: PT, OT [facility] Agency: [place] Date [place]: 03/15/21 Time [place]: 2 Representative spoke with at [place]: Amy  Social Determinants of Health ([place]) Interventions     Readmission Risk Interventions Readmission Risk Prevention Plan 08/29/2020 10/22/2019  Transportation Screening Complete (No Data)  PCP or Specialist Appt within 3-5 Days - Complete  HRI or [place] - Complete  Social Work Consult for [place] Planning/Counseling - Complete  Palliative Care Screening  - Not Applicable  Medication Review Press photographer) Complete Complete  PCP or Specialist appointment within 3-5 days of discharge Complete -  [place] or Home Care Consult Complete -  SW Recovery Care/Counseling Consult Complete -  Palliative Care Screening Complete -  [place] Not Applicable -  Some recent data might be hidden

## 2021-03-16 NOTE — Progress Notes (Signed)
Occupational Therapy Treatment Patient Details Name: MELVINIA ASHBY MRN: [id number] DOB: 09/07/1945 Today's Date: 03/16/2021   History of present illness 76 y.o. female presented 03/11/21 with new onset of shortness of breath breathing for 2 days. COVID+; acute asthma exacerbation;    PMH significant of mild intermittent asthma, CAD with stenting, HTN/chronic diastolic CHF, IIDM   OT comments  Pt making steady progress towards OT goals this session. Session focus on functional mobility, BUE therex and education related to energy conservation strategies for home. Pt greeted up in room completing wash up at sink. Pt completed stand pivot back to EOB with Rw and min guard assist. Education provided on energy conservation strategies for home with handout provided. Pt completed BUE therex as indicated below with HEP provided. Pt on RA during session with SpO2 dropping briefly to 89% but rebounds quickly to 91% with seated rest break < 1 min. Pt likely to DC home today but will follow acutely per POC.     Recommendations for follow up therapy are one component of a multi-disciplinary discharge planning process, led by the attending physician.  Recommendations may be updated based on patient status, additional functional criteria and insurance authorization.    Follow Up Recommendations  Home health OT    Assistance Recommended at Discharge Intermittent Supervision/Assistance  Patient can return home with the following  A little help with walking and/or transfers;A little help with bathing/dressing/bathroom   Equipment Recommendations  None recommended by OT    Recommendations for Other Services      Precautions / Restrictions Precautions Precautions: Fall Precaution Comments: reports falls at home related to dizziness Restrictions Weight Bearing Restrictions: Yes       Mobility Bed Mobility Overal bed mobility: Modified Independent                  Transfers Overall transfer  level: Needs assistance Equipment used: Rolling walker (2 wheels) Transfers: Sit to/from Stand Sit to Stand: Min guard           General transfer comment: min guard for sit<>stand from EOB     Balance Overall balance assessment: Needs assistance Sitting-balance support: No upper extremity supported;Feet supported Sitting balance-Leahy Scale: Good     Standing balance support: No upper extremity supported;During functional activity Standing balance-Leahy Scale: Fair Standing balance comment: pt standing at sink upon arrival with no UE support                           ADL either performed or assessed with clinical judgement   ADL Overall ADL's : Needs assistance/impaired                         Toilet Transfer: Min guard;Rolling walker (2 wheels);Stand-pivot Armed forces technical officer Details (indicate cue type and reason): simulated via stand pivot transfer as pt was up in room upon arrival         Functional mobility during ADLs: Min guard;Rolling walker (2 wheels) General ADL Comments: pt up in room upon arrival completing ADLs upon arrival, session focus on education related to energy conservation strategies as well as implementing BUE HEP for home    Extremity/Trunk Assessment Upper Extremity Assessment Upper Extremity Assessment: Generalized weakness   Lower Extremity Assessment Lower Extremity Assessment: Defer to PT evaluation   Cervical / Trunk Assessment Cervical / Trunk Assessment: Normal    Vision Baseline Vision/History: 1 Wears glasses Ability to See in Adequate Light:  0 Adequate Vision Assessment?: No apparent visual deficits   Perception Perception Perception: Not tested   Praxis Praxis Praxis: Not tested    Cognition Arousal/Alertness: Awake/alert Behavior During Therapy: WFL for tasks assessed/performed Overall Cognitive Status: No family/caregiver present to determine baseline cognitive functioning                                  General Comments: likely baseline level of cog but pt with decreased safety awareness noted, pt unable to determine the difference between her phone ringing and the hospital phone ringing          Exercises     Shoulder Instructions       General Comments pt on RA during session with drop to 89% after therex but quickly rebounds to 91%    Pertinent Vitals/ Pain       Pain Assessment: No/denies pain (pt denies pain but asks for pain meds at end of session)  Home Living                                          Prior Functioning/Environment              Frequency  Min 2X/week        Progress Toward Goals  OT Goals(current goals can now be found in the care plan section)  Progress towards OT goals: Progressing toward goals  Acute Rehab OT Goals Patient Stated Goal: to go home OT Goal Formulation: With patient Time For Goal Achievement: 03/29/21 Potential to Achieve Goals: Good  Plan Discharge plan remains appropriate;Frequency remains appropriate    Co-evaluation                 AM-PAC OT "6 Clicks" Daily Activity     Outcome Measure   Help from another person eating meals?: None Help from another person taking care of personal grooming?: A Little Help from another person toileting, which includes using toliet, bedpan, or urinal?: A Little Help from another person bathing (including washing, rinsing, drying)?: A Little Help from another person to put on and taking off regular upper body clothing?: A Little Help from another person to put on and taking off regular lower body clothing?: A Little 6 Click Score: 19    End of Session Equipment Utilized During Treatment: Rolling walker (2 wheels)  OT Visit Diagnosis: Other abnormalities of gait and mobility (R26.89);Muscle weakness (generalized) (M62.81)   Activity Tolerance Patient tolerated treatment well   Patient Left in bed;with call bell/phone within reach    Nurse Communication Mobility status        Time: 6712-4580 OT Time Calculation (min): 30 min  Charges: OT General Charges $OT Visit: 1 Visit OT Treatments $Self Care/Home Management : 8-22 mins $Therapeutic Exercise: 8-22 mins  Harley Alto., COTA/L Acute Rehabilitation Services [phone]   Precious Haws 03/16/2021, 12:17 PM

## 2021-03-16 NOTE — Progress Notes (Signed)
Pt's home O2 has been delivered to pt's room. Pt's daughter [name] stated she will pick pt up at 1600.

## 2021-03-20 DIAGNOSIS — J4520 Mild intermittent asthma, uncomplicated: Secondary | ICD-10-CM | POA: Diagnosis not present

## 2021-03-20 DIAGNOSIS — J1282 Pneumonia due to coronavirus disease 2019: Secondary | ICD-10-CM | POA: Diagnosis not present

## 2021-03-20 DIAGNOSIS — I2510 Atherosclerotic heart disease of native coronary artery without angina pectoris: Secondary | ICD-10-CM | POA: Diagnosis not present

## 2021-03-20 DIAGNOSIS — D509 Iron deficiency anemia, unspecified: Secondary | ICD-10-CM | POA: Diagnosis not present

## 2021-03-20 DIAGNOSIS — I5042 Chronic combined systolic (congestive) and diastolic (congestive) heart failure: Secondary | ICD-10-CM | POA: Diagnosis not present

## 2021-03-20 DIAGNOSIS — R0600 Dyspnea, unspecified: Secondary | ICD-10-CM | POA: Diagnosis not present

## 2021-03-20 DIAGNOSIS — E119 Type 2 diabetes mellitus without complications: Secondary | ICD-10-CM | POA: Diagnosis not present

## 2021-03-20 DIAGNOSIS — I110 Hypertensive heart disease with heart failure: Secondary | ICD-10-CM | POA: Diagnosis not present

## 2021-03-20 DIAGNOSIS — U071 COVID-19: Secondary | ICD-10-CM | POA: Diagnosis not present

## 2021-03-20 DIAGNOSIS — I255 Ischemic cardiomyopathy: Secondary | ICD-10-CM | POA: Diagnosis not present

## 2021-03-20 DIAGNOSIS — R2681 Unsteadiness on feet: Secondary | ICD-10-CM | POA: Diagnosis not present

## 2021-03-23 DIAGNOSIS — G479 Sleep disorder, unspecified: Secondary | ICD-10-CM | POA: Diagnosis not present

## 2021-03-23 DIAGNOSIS — I5042 Chronic combined systolic (congestive) and diastolic (congestive) heart failure: Secondary | ICD-10-CM | POA: Diagnosis not present

## 2021-03-23 DIAGNOSIS — E1159 Type 2 diabetes mellitus with other circulatory complications: Secondary | ICD-10-CM | POA: Diagnosis not present

## 2021-03-23 DIAGNOSIS — U071 COVID-19: Secondary | ICD-10-CM | POA: Diagnosis not present

## 2021-03-23 DIAGNOSIS — J449 Chronic obstructive pulmonary disease, unspecified: Secondary | ICD-10-CM | POA: Diagnosis not present

## 2021-03-23 DIAGNOSIS — D509 Iron deficiency anemia, unspecified: Secondary | ICD-10-CM | POA: Diagnosis not present

## 2021-03-23 DIAGNOSIS — I110 Hypertensive heart disease with heart failure: Secondary | ICD-10-CM | POA: Diagnosis not present

## 2021-03-23 DIAGNOSIS — R531 Weakness: Secondary | ICD-10-CM | POA: Diagnosis not present

## 2021-03-23 DIAGNOSIS — I2510 Atherosclerotic heart disease of native coronary artery without angina pectoris: Secondary | ICD-10-CM | POA: Diagnosis not present

## 2021-03-23 DIAGNOSIS — J1282 Pneumonia due to coronavirus disease 2019: Secondary | ICD-10-CM | POA: Diagnosis not present

## 2021-03-26 ENCOUNTER — Other Ambulatory Visit: Payer: Self-pay

## 2021-03-26 ENCOUNTER — Encounter: Payer: Self-pay | Admitting: Neurology

## 2021-03-26 ENCOUNTER — Other Ambulatory Visit (INDEPENDENT_AMBULATORY_CARE_PROVIDER_SITE_OTHER): Payer: PPO

## 2021-03-26 ENCOUNTER — Ambulatory Visit: Payer: PPO | Admitting: Neurology

## 2021-03-26 VITALS — BP 156/78 | HR 75 | Ht 62.0 in | Wt 164.2 lb

## 2021-03-26 DIAGNOSIS — R292 Abnormal reflex: Secondary | ICD-10-CM

## 2021-03-26 DIAGNOSIS — R413 Other amnesia: Secondary | ICD-10-CM

## 2021-03-26 DIAGNOSIS — R2681 Unsteadiness on feet: Secondary | ICD-10-CM | POA: Diagnosis not present

## 2021-03-26 DIAGNOSIS — R42 Dizziness and giddiness: Secondary | ICD-10-CM | POA: Diagnosis not present

## 2021-03-26 LAB — VITAMIN B12: Vitamin B-12: 283 pg/mL (ref 211–911)

## 2021-03-26 LAB — TSH: TSH: 1.19 u[IU]/mL (ref 0.35–5.50)

## 2021-03-26 NOTE — Progress Notes (Signed)
NEUROLOGY CONSULTATION NOTE  Nicole [name] MRN: [id number] DOB: 1946/01/01  Referring provider: Dr. [name] Primary care provider:  Dr. [name]  Reason for consult:  vertigo and balance  Dear Dr Forde Dandy:  Thank you for your kind referral of Nicole [name] for consultation of the above symptoms. Although her history is well known to you, please allow me to reiterate it for the purpose of our medical record. The patient was accompanied to the clinic by her daughter Nicole [name] who also provides collateral information. Records and images were personally reviewed where available.   HISTORY OF PRESENT ILLNESS: This is a 76 year old right-handed woman with a history of hypertension, CHF, DM, fibromyalgia with chronic pain, anxiety, CAD, previously seen in our office for chronic daily headaches and memory loss (last visit in 2019), presenting for evaluation of vertigo and gait instability. She is accompanied by her daughter Nicole [name] today. They report that she had brain fog before COVID infection in April 2021, and the infection "did not help." She then had another bout of COVID earlier this month treated with Remdesivir and steroids. Nicole [name] has been staying with her since April 2021. She describes dizziness as spinning and feeling like she would pass out. There is no loss of consciousness. She feels the dizziness also affects her eyes. It comes on every other day or so, lasting a few hours to all day. She notices the dizziness with head movements, when lying down moving her head, or looking up or down. She feels she has to focus her eyes because they are blurred and she sees double, there has been associated nausea/vomiting. She takes meclizine daily but it does not help. They report falls with the dizziness, last fall was 2 weeks ago without her walker. Nicole [name] reports she did well in the past after physical therapy, she is back to doing PT again. She is still having the headaches and they can  get pretty bad when she is not feeling well, otherwise "I can live with them." Since Nicole [name] moved in, she started managing her medications because she was getting confused with them. Nicole [name] also took over finances, she had double paid 1-2 bills. She stopped driving in Aug/Sept 3244, she denies getting lost. She does not cook, she forgets where she is in the recipe and gets so weak and tired. She does not sleep well, sometimes up all night. She stopped Trazodone because it was not helping. She has some neck pain. 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 She notes that when she urinates, she also has a bowel movement. No incontinence. She uses O2 at night.   She was evaluated by Speech therapy in June 2022 with SLUMS of 20/30. MRI brain in 01/2020 no acute changes, mild chronic microvascular disease.  Laboratory Data: B12 in 05/2019 was 233, 1686 in 06/2019  Lab Results  Component Value Date   VITAMINB12 1,686 (H) 06/24/2019   Lab Results  Component Value Date   TSH 0.945 01/11/2020    PAST MEDICAL HISTORY: Past Medical History:  Diagnosis Date   Anemia    Anxiety    Back pain    CAD (coronary artery disease) 10/22/2019   [facility] to [facility] August 2021   Colon polyps    COVID-28 June 2019   Endometriosis    Fibromyalgia    GERD with esophagitis    Follows with Dr. Fuller Plan with GI, EGD in the past  IBS (irritable bowel syndrome)    Major depressive disorder    Migraine    Mild intermittent asthma    Mixed diabetic hyperlipidemia associated with type 2 diabetes mellitus ([place])    Type 2 diabetes mellitus ([place])    Urinary incontinence     PAST SURGICAL HISTORY: Past Surgical History:  Procedure Laterality Date   ABDOMINAL HYSTERECTOMY     TAH BSO   APPENDECTOMY  1999   CATARACT EXTRACTION     both   CHOLECYSTECTOMY     CORONARY STENT INTERVENTION N/A 10/21/2019   Procedure: CORONARY STENT INTERVENTION;  Surgeon: Troy Sine, MD;   Location: [facility];  Service: Cardiovascular;  Laterality: N/A;   DENTAL SURGERY     HERNIA REPAIR  2000   LAPAROSCOPIC ENDOMETRIOSIS FULGURATION     RIGHT/LEFT HEART CATH AND CORONARY ANGIOGRAPHY N/A 10/21/2019   Procedure: RIGHT/LEFT HEART CATH AND CORONARY ANGIOGRAPHY;  Surgeon: Troy Sine, MD;  Location: [facility];  Service: Cardiovascular;  Laterality: N/A;    MEDICATIONS: Current Outpatient Medications on File Prior to Visit  Medication Sig Dispense Refill   albuterol (PROVENTIL) (2.5 MG/3ML) 0.083% nebulizer solution Use 1 vial (2.5 mg total) by nebulization every 6 (six) hours as needed for wheezing or shortness of breath. 90 mL 2   aspirin 81 MG tablet Take 81 mg by mouth daily.     atorvastatin (LIPITOR) 80 MG tablet TAKE 1 TABLET(80 MG) BY MOUTH AT BEDTIME (Patient taking differently: Take 80 mg by mouth at bedtime.) 30 tablet 11   DULoxetine (CYMBALTA) 60 MG capsule Take 60 mg by mouth daily.     ENTRESTO 97-103 MG TAKE 1 TABLET BY MOUTH TWICE DAILY (Patient taking differently: 1 tablet 2 (two) times daily.) 60 tablet 5   ergocalciferol (VITAMIN D2) 50000 UNITS capsule Take 50,000 Units by mouth once a week.     fluticasone (FLONASE) 50 MCG/ACT nasal spray Place 1 spray into both nostrils daily as needed for allergies or rhinitis.      Fluticasone-Salmeterol (ADVAIR) 250-50 MCG/DOSE AEPB Inhale 1 puff into the lungs 2 (two) times daily.     furosemide (LASIX) 40 MG tablet Take 0.5 tablets (20 mg total) by mouth daily as needed for edema or fluid (Please take 20mg  Daily as needed for swelling, or weight gain.). (Patient taking differently: Take 20 mg by mouth daily as needed for edema or fluid.) 90 tablet 3   ipratropium-albuterol (DUONEB) 0.5-2.5 (3) MG/3ML SOLN Use 1 vial by nebulization 2 (two) times daily. Use twice a day scheduled 360 mL 0   JANUMET 50-500 MG tablet Take 1 tablet by mouth daily.     loratadine (CLARITIN) 10 MG tablet Take 1 tablet (10 mg  total) by mouth daily. 30 tablet 0   LORazepam (ATIVAN) 1 MG tablet Take 1 tablet (1 mg total) by mouth 2 (two) times daily. 3 tablet 0   meclizine (ANTIVERT) 25 MG tablet Take 25 mg by mouth daily as needed for dizziness.     metoprolol succinate (TOPROL-XL) 50 MG 24 hr tablet TAKE 1 TABLET(50 MG) BY MOUTH DAILY WITH OR IMMEDIATELY FOLLOWING A MEAL (Patient taking differently: 50 mg daily.) 90 tablet 3   ONETOUCH DELICA LANCETS 00F MISC      ONETOUCH VERIO test strip      oxyCODONE-acetaminophen (PERCOCET) 10-325 MG tablet Take 1 tablet by mouth 2 (two) times daily as needed for pain. 3 tablet 0   pantoprazole (PROTONIX) 40 MG tablet  Take 40 mg by mouth 2 (two) times daily.     potassium chloride SA (KLOR-CON) 20 MEQ tablet TAKE 2 TABLET BY MOUTH DAILY (Patient taking differently: 20 mEq daily.) 180 tablet 6   predniSONE (DELTASONE) 5 MG tablet Take 8 tabs by mouth once daily for 3 days, then 6 tabs once daily for 3 days, then 4 tabs once daily for 3 days, then 2 tabs once daily for 3 days, then 1 tab once daily for 3 days, then 1/2 tab once daily for 3 days, then stop. 65 tablet 0   PROAIR HFA 108 (90 Base) MCG/ACT inhaler Inhale 2 puffs into the lungs every 4 (four) hours as needed for wheezing or shortness of breath. Uses as rescue inhaler Reported on 06/20/2015 6.7 g 0   promethazine (PHENERGAN) 12.5 MG tablet Take 1 tablet (12.5 mg total) by mouth every 6 (six) hours as needed for nausea. 20 tablet 0   traZODone (DESYREL) 50 MG tablet Take 50 mg by mouth at bedtime.     No current facility-administered medications on file prior to visit.    ALLERGIES: Allergies  Allergen Reactions   Fish-Derived Products Anaphylaxis   Shellfish Allergy Shortness Of Breath    In dye for scan.   Aspirin Nausea Only and Other (See Comments)    Pt stated it only gives her problems when she takes the high dose (325 mg)   Benadryl [Diphenhydramine]     "flips out with it. Eyes roll to the back of her head."    Ivp Dye [Iodinated Contrast Media]     Hx of ? anaphlaxis   Latex Other (See Comments)    Pt says she gets a "stinky infection"    FAMILY HISTORY: Family History  Problem Relation Age of Onset   Hypertension Mother    Heart failure Mother    Diabetes Mother    Hypertension Father    Heart failure Father    Diabetes Father    Heart attack Father    Stroke Father    Cancer Brother        lung   Thyroid disease Brother    Cancer Brother        throat   Cancer Paternal Aunt        bone   Cancer Paternal Uncle        lung   Hypertension Maternal Grandmother    Hypertension Maternal Grandfather    Hypertension Paternal Grandmother    Hypertension Paternal Grandfather    Diabetes Daughter    Colon cancer Neg Hx    Stomach cancer Neg Hx     SOCIAL HISTORY: Social History   Socioeconomic History   Marital status: Widowed    Spouse name: Not on file   Number of children: 1   Years of education: Not on file   Highest education level: Not on file  Occupational History   Occupation: disability  Tobacco Use   Smoking status: Never   Smokeless tobacco: Never  Substance and Sexual Activity   Alcohol use: Not Currently    Comment: occasional   Drug use: No   Sexual activity: Not Currently    Comment: 1st intercourse 76 yo-Fewer than 5 partners  Other Topics Concern   Not on file  Social History Narrative   Not on file   Social Determinants of Health   Financial Resource Strain: Not on file  Food Insecurity: Not on file  Transportation Needs: Not on file  Physical Activity: Not  on file  Stress: Not on file  Social Connections: Not on file  Intimate Partner Violence: Not on file     PHYSICAL EXAM: Vitals:   03/26/21 1032  BP: (!) 156/78  Pulse: 75  SpO2: 98%   General: No acute distress Head:  Normocephalic/atraumatic Skin/Extremities: No rash, no edema Neurological Exam: Mental status: alert and awake, no dysarthria or aphasia, Fund of knowledge is  appropriate.  Recent and remote memory are impaired.  Attention and concentration are normal.    Cranial nerves: CN I: not tested CN II: pupils equal, round and reactive to light, visual fields intact CN III, IV, VI:  full range of motion, no nystagmus, no ptosis CN V: facial sensation intact CN VII: upper and lower face symmetric CN VIII: hearing intact to conversation CN IX, X: gag intact, uvula midline CN XI: sternocleidomastoid and trapezius muscles intact CN XII: tongue midline Bulk & Tone: normal, no fasciculations. Motor: 5/5 throughout with no pronator drift. Sensation: intact to light touch, cold, pin, vibration sense.  No extinction to double simultaneous stimulation.  Romberg test negative Deep Tendon Reflexes: brisk +2 throughout, no ankle clonus, negative Hoffman sign Cerebellar: no incoordination on finger to nose testing Gait: slow and cautious, taking small steps Tremor: none   IMPRESSION: This is a 76 year old right-handed woman with a history of hypertension, CHF, DM, fibromyalgia with chronic pain, anxiety, CAD, previously seen in our office for chronic daily headaches and memory loss (last visit in 2019), presenting for evaluation of vertigo and gait instability. Her neurological exam shows brisk reflexes, otherwise non-focal. Dizziness appears related to head movements, she will be referred for Vestibular therapy. Due to brisk reflexes, will also do MRI cervical spine without contrast to rule out myelopathy. Cervicogenic dizziness is also a possibility. She is already doing physical therapy for balance. They also report "brain fog," SLUMS 20/30 in June 2022. For memory concerns, we will check TSH, B12, and schedule Neurocognitive testing to further evaluate symptoms. Continue close supervision. She does not drive. Follow-up in 6 months, call for any changes.    Thank you for allowing me to participate in the care of this patient. Please do not hesitate to call for any  questions or concerns.   Ellouise Newer, M.D.  CC: Dr. Forde Dandy

## 2021-03-26 NOTE — Patient Instructions (Signed)
Good to see you!   Bloodwork for TSH, B12  2. Referral will be sent for Vestibular therapy  3. Schedule open MRI cervical spine without contrast  4. Schedule Neurocognitive testing  5. Follow-up in 6 months, call for any changes

## 2021-04-16 ENCOUNTER — Other Ambulatory Visit: Payer: Self-pay

## 2021-04-16 ENCOUNTER — Other Ambulatory Visit: Payer: Medicare Other

## 2021-04-16 DIAGNOSIS — Z515 Encounter for palliative care: Secondary | ICD-10-CM

## 2021-04-16 NOTE — Progress Notes (Signed)
COMMUNITY PALLIATIVE CARE SW NOTE  PATIENT NAME: Nicole [name] DOB: [birth year] MRN: [id number]  PRIMARY CARE PROVIDER: [name], MD  RESPONSIBLE PARTY:  Acct ID - Guarantor Home Phone Work Phone Relationship Acct Type  [id number] Terence [phone]  Self P/F     [address], [place], [place] [address]   Due to the COVID-19 crisis, this virtual check-in visit was done via telephone from my office and it was initiated and consent by this patient and or family.  SOCIAL WORK TELEPHONE ENCOUNTER (3:00 pm-3:20 pm)  SW completed a telephonic check-in with patient. SW spoke directly with patient. Patient reported that she has had COVID 2x that has caused some heart issues, kidney and some memory loss (foggy). She is utilizing a walker to ambulate. She is currently going through testing for vertigo.  She is scheduled to see Dr. Forde Dandy soon, but was not sure of the date. SW assessed any immediate medical  or psychosocial needs and none were noted. Patient remains open to ongoing palliative care support/visits.   [address] [place], [place]

## 2021-04-23 ENCOUNTER — Other Ambulatory Visit: Payer: Self-pay

## 2021-04-23 ENCOUNTER — Encounter: Payer: Self-pay | Admitting: Pulmonary Disease

## 2021-04-23 ENCOUNTER — Ambulatory Visit: Payer: Medicare Other | Admitting: Pulmonary Disease

## 2021-04-23 VITALS — BP 128/76 | HR 71 | Ht 62.0 in | Wt 168.2 lb

## 2021-04-23 DIAGNOSIS — J4540 Moderate persistent asthma, uncomplicated: Secondary | ICD-10-CM | POA: Diagnosis not present

## 2021-04-23 MED ORDER — SPIRIVA RESPIMAT 2.5 MCG/ACT IN AERS: 2.0000 | INHALATION_SPRAY | Freq: Every day | RESPIRATORY_TRACT | 0 refills | Status: DC

## 2021-04-23 NOTE — Patient Instructions (Addendum)
Continue advair inhaler 1 puff twice daily - rinse mouth out after each use  Start spiriva 2.39mcg 2 puffs daily   Recommend sleeping with the head of your bed elevated by placing blocks under the head of the bed, raising it 4-6 inches. Or you could buy a wedge pillow and try that first.   Follow up in 6 weeks with pulmonary function tests

## 2021-04-23 NOTE — Progress Notes (Signed)
Synopsis: Referred in February 2023 for COPD by Earle Gell, NP  Subjective:   PATIENT ID: Nicole [name] GENDER: female DOB: Nov 26, 1945, MRN: [id number]  HPI  Chief Complaint  Patient presents with   Consult    Referred by PCP for history of asthma and recently was told that she has COPD. Uses 2L of O2 at nightly only.    Nicole [name] is a 76 year old woman, never smoker with history of diabetes, high cholesterol, allergy/sinus trouble, coronary artery disease and asthma who is referred to pulmonary clinic for COPD.  She reports life long history of asthma. She was previously followed by Dr. Bernita Buffy. She is using advair 250-16mcg 1 puff twice daily and as needed albuterol. She has nebulizer solution but no machine. She is experiencing cough, shortness of breath and intermittent wheezing which has increased since having covid 19 infection where she was admitted to the hospital 1/2 to 1/6. She is also experiencing brain fog along with vision changes. She is seeing a neurologist for issues with feeling dizzy. She was sent home with oxygen from the hospital. She has issues with reflux that ca wake  her up at night and is followed by GI. She typically lays flat on her side to sleep.  She is a never smoker but has significant second hand smoke exposure in childhood from her parents and then from her husband later in life. 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 Her mother and father had asthma and her brother has COPD/emphysema.   Past Medical History:  Diagnosis Date   Anemia    Anxiety    Back pain    CAD (coronary artery disease) 10/22/2019   DES to [facility] August 2021   Colon polyps    COVID-28 June 2019   Endometriosis    Fibromyalgia    GERD with esophagitis    Follows with Dr. Fuller Plan with GI, EGD in the past   IBS (irritable bowel syndrome)    Major depressive disorder    Migraine    Mild intermittent asthma    Mixed diabetic hyperlipidemia associated with  type 2 diabetes mellitus ([place])    Type 2 diabetes mellitus ([place])    Urinary incontinence      Family History  Problem Relation Age of Onset   Hypertension Mother    Heart failure Mother    Diabetes Mother    Hypertension Father    Heart failure Father    Diabetes Father    Heart attack Father    Stroke Father    Cancer Brother        lung   Thyroid disease Brother    Cancer Brother        throat   Cancer Paternal Aunt        bone   Cancer Paternal Uncle        lung   Hypertension Maternal Grandmother    Hypertension Maternal Grandfather    Hypertension Paternal Grandmother    Hypertension Paternal Grandfather    Diabetes Daughter    Colon cancer Neg Hx    Stomach cancer Neg Hx      Social History   Socioeconomic History   Marital status: Widowed    Spouse name: Not on file   Number of children: 1   Years of education: Not on file   Highest education level: Not on file  Occupational History   Occupation: disability  Tobacco Use  Smoking status: Never   Smokeless tobacco: Never  Vaping Use   Vaping Use: Never used  Substance and Sexual Activity   Alcohol use: Not Currently    Comment: occasional   Drug use: No   Sexual activity: Not Currently    Comment: 1st intercourse 76 yo-Fewer than 5 partners  Other Topics Concern   Not on file  Social History Narrative   Right handed    Social Determinants of Health   Financial Resource Strain: Not on file  Food Insecurity: Not on file  Transportation Needs: Not on file  Physical Activity: Not on file  Stress: Not on file  Social Connections: Not on file  Intimate Partner Violence: Not on file     Allergies  Allergen Reactions   Fish-Derived Products Anaphylaxis   Shellfish Allergy Shortness Of Breath    In dye for scan.   Aspirin Nausea Only and Other (See Comments)    Pt stated it only gives her problems when she takes the high dose (325 mg)   Benadryl [Diphenhydramine]     "flips out with it. Eyes  roll to the back of her head."   Ivp Dye [Iodinated Contrast Media]     Hx of ? anaphlaxis   Latex Other (See Comments)    Pt says she gets a "stinky infection"     Outpatient Medications Prior to Visit  Medication Sig Dispense Refill   albuterol (PROVENTIL) (2.5 MG/3ML) 0.083% nebulizer solution Use 1 vial (2.5 mg total) by nebulization every 6 (six) hours as needed for wheezing or shortness of breath. 90 mL 2   aspirin 81 MG tablet Take 81 mg by mouth daily.     atorvastatin (LIPITOR) 80 MG tablet TAKE 1 TABLET(80 MG) BY MOUTH AT BEDTIME (Patient taking differently: Take 80 mg by mouth at bedtime.) 30 tablet 11   DULoxetine (CYMBALTA) 30 MG capsule Take 30 mg by mouth daily.     ENTRESTO 97-103 MG TAKE 1 TABLET BY MOUTH TWICE DAILY (Patient taking differently: 1 tablet 2 (two) times daily.) 60 tablet 5   ergocalciferol (VITAMIN D2) 50000 UNITS capsule Take 50,000 Units by mouth once a week.     fluticasone (FLONASE) 50 MCG/ACT nasal spray Place 1 spray into both nostrils daily as needed for allergies or rhinitis.      Fluticasone-Salmeterol (ADVAIR) 250-50 MCG/DOSE AEPB Inhale 1 puff into the lungs 2 (two) times daily.     furosemide (LASIX) 40 MG tablet Take 0.5 tablets (20 mg total) by mouth daily as needed for edema or fluid (Please take 20mg  Daily as needed for swelling, or weight gain.). (Patient taking differently: Take 20 mg by mouth daily as needed for edema or fluid.) 90 tablet 3   ipratropium-albuterol (DUONEB) 0.5-2.5 (3) MG/3ML SOLN Use 1 vial by nebulization 2 (two) times daily. Use twice a day scheduled 360 mL 0   JANUMET 50-500 MG tablet Take 1 tablet by mouth daily.     loratadine (CLARITIN) 10 MG tablet Take 1 tablet (10 mg total) by mouth daily. 30 tablet 0   LORazepam (ATIVAN) 1 MG tablet Take 1 tablet (1 mg total) by mouth 2 (two) times daily. 3 tablet 0   meclizine (ANTIVERT) 25 MG tablet Take 25 mg by mouth daily as needed for dizziness.     metoprolol succinate  (TOPROL-XL) 50 MG 24 hr tablet TAKE 1 TABLET(50 MG) BY MOUTH DAILY WITH OR IMMEDIATELY FOLLOWING A MEAL (Patient taking differently: 50 mg daily.) 90 tablet 3  ONETOUCH DELICA LANCETS 71G MISC      ONETOUCH VERIO test strip      oxyCODONE-acetaminophen (PERCOCET) 10-325 MG tablet Take 1 tablet by mouth 2 (two) times daily as needed for pain. 3 tablet 0   pantoprazole (PROTONIX) 40 MG tablet Take 40 mg by mouth 2 (two) times daily.     potassium chloride SA (KLOR-CON) 20 MEQ tablet TAKE 2 TABLET BY MOUTH DAILY 180 tablet 6   PROAIR HFA 108 (90 Base) MCG/ACT inhaler Inhale 2 puffs into the lungs every 4 (four) hours as needed for wheezing or shortness of breath. Uses as rescue inhaler Reported on 06/20/2015 6.7 g 0   promethazine (PHENERGAN) 12.5 MG tablet Take 1 tablet (12.5 mg total) by mouth every 6 (six) hours as needed for nausea. 20 tablet 0   traZODone (DESYREL) 50 MG tablet Take 75 mg by mouth at bedtime.     predniSONE (DELTASONE) 5 MG tablet Take 8 tabs by mouth once daily for 3 days, then 6 tabs once daily for 3 days, then 4 tabs once daily for 3 days, then 2 tabs once daily for 3 days, then 1 tab once daily for 3 days, then 1/2 tab once daily for 3 days, then stop. 65 tablet 0   No facility-administered medications prior to visit.    Review of Systems  Constitutional:  Negative for chills, fever, malaise/fatigue and weight loss.  HENT:  Negative for congestion, sinus pain and sore throat.   Eyes: Negative.   Respiratory:  Positive for cough, shortness of breath and wheezing. Negative for hemoptysis and sputum production.   Cardiovascular:  Negative for chest pain, palpitations, orthopnea, claudication and leg swelling.  Gastrointestinal:  Negative for abdominal pain, heartburn, nausea and vomiting.  Genitourinary: Negative.   Musculoskeletal:  Negative for joint pain and myalgias.  Skin:  Negative for rash.  Neurological:  Positive for headaches. Negative for weakness.   Endo/Heme/Allergies: Negative.   Psychiatric/Behavioral:  Positive for depression. The patient is nervous/anxious.    Objective:   Vitals:   04/23/21 1528  BP: 128/76  Pulse: 71  SpO2: 97%  Weight: 168 lb 3.2 oz (76.3 kg)  Height: 5\' 2"  (1.575 m)   Physical Exam Constitutional:      General: She is not in acute distress.    Appearance: Normal appearance. She is not ill-appearing.  HENT:     Head: Normocephalic and atraumatic.  Eyes:     General: No scleral icterus.    Conjunctiva/sclera: Conjunctivae normal.     Pupils: Pupils are equal, round, and reactive to light.  Cardiovascular:     Rate and Rhythm: Normal rate and regular rhythm.     Pulses: Normal pulses.     Heart sounds: Normal heart sounds. No murmur heard. Pulmonary:     Effort: Pulmonary effort is normal.     Breath sounds: Normal breath sounds. No wheezing, rhonchi or rales.  Abdominal:     General: Bowel sounds are normal.     Palpations: Abdomen is soft.  Musculoskeletal:     Right lower leg: No edema.     Left lower leg: No edema.  Lymphadenopathy:     Cervical: No cervical adenopathy.  Skin:    General: Skin is warm and dry.  Neurological:     General: No focal deficit present.     Mental Status: She is alert.  Psychiatric:        Mood and Affect: Mood normal.  Behavior: Behavior normal.        Thought Content: Thought content normal.        Judgment: Judgment normal.    CBC    Component Value Date/Time   WBC 8.0 03/16/2021 0201   RBC 3.72 (L) 03/16/2021 0201   HGB 10.8 (L) 03/16/2021 0201   HCT 33.4 (L) 03/16/2021 0201   PLT 282 03/16/2021 0201   MCV 89.8 03/16/2021 0201   MCH 29.0 03/16/2021 0201   MCHC 32.3 03/16/2021 0201   RDW 12.7 03/16/2021 0201   LYMPHSABS 1.1 03/16/2021 0201   MONOABS 0.2 03/16/2021 0201   EOSABS 0.0 03/16/2021 0201   BASOSABS 0.1 03/16/2021 0201   BMP Latest Ref Rng & Units 03/16/2021 03/15/2021 03/14/2021  Glucose 70 - 99 mg/dL 242(H) 249(H) 147(H)   BUN 8 - 23 mg/dL 21 21 20   Creatinine 0.44 - 1.00 mg/dL 1.17(H) 1.02(H) 0.95  BUN/Creat Ratio 12 - 28 - - -  Sodium 135 - 145 mmol/L 136 135 137  Potassium 3.5 - 5.1 mmol/L 4.7 4.9 4.8  Chloride 98 - 111 mmol/L 102 104 106  CO2 22 - 32 mmol/L 27 25 23   Calcium 8.9 - 10.3 mg/dL 9.6 9.6 9.2   Chest imaging: CXR 03/15/21 There are small linear densities in the lower lung fields, more so on the left side with interval worsening suggesting subsegmental atelectasis. Possible minimal left pleural effusion.  PFT: No flowsheet data found.  Labs:  Path:  Echo 08/29/20: LV EF 60-65%. Grade I diastolic dysfunction. RV systolic function is normal.    Heart Catheterization:  Assessment & Plan:   Moderate persistent asthma without complication - Plan: Pulmonary Function Test, Ambulatory Referral for DME  Discussion: Nicole [name] is a 76 year old woman, never smoker with history of asthma, DMII, and coronary artery disease who is referred to pulmonary clinic for evaluation of COPD.   She has long standing history of asthma along with significant second hand exposure. 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 We will check pulmonary function tests for further evaluation.   She is to continue advair 250-81mcg 1 puff twice daily. We will add Spiriva 2.49mcg 2 puffs daily and monitor for any improvement. If she notices improvement we will then send in a prescription.   Follow up in 4-6 weeks for pulmonary function testing.  Freda Jackson, MD [facility] Office: [phone]    Current Outpatient Medications:    albuterol (PROVENTIL) (2.5 MG/3ML) 0.083% nebulizer solution, Use 1 vial (2.5 mg total) by nebulization every 6 (six) hours as needed for wheezing or shortness of breath., Disp: 90 mL, Rfl: 2   aspirin 81 MG tablet, Take 81 mg by mouth daily., Disp: , Rfl:    atorvastatin (LIPITOR) 80 MG tablet, TAKE 1 TABLET(80 MG) BY MOUTH AT BEDTIME (Patient taking  differently: Take 80 mg by mouth at bedtime.), Disp: 30 tablet, Rfl: 11   DULoxetine (CYMBALTA) 30 MG capsule, Take 30 mg by mouth daily., Disp: , Rfl:    ENTRESTO 97-103 MG, TAKE 1 TABLET BY MOUTH TWICE DAILY (Patient taking differently: 1 tablet 2 (two) times daily.), Disp: 60 tablet, Rfl: 5   ergocalciferol (VITAMIN D2) 50000 UNITS capsule, Take 50,000 Units by mouth once a week., Disp: , Rfl:    fluticasone (FLONASE) 50 MCG/ACT nasal spray, Place 1 spray into both nostrils daily as needed for allergies or rhinitis. , Disp: , Rfl:    Fluticasone-Salmeterol (ADVAIR) 250-50 MCG/DOSE AEPB, Inhale 1 puff into the  lungs 2 (two) times daily., Disp: , Rfl:    furosemide (LASIX) 40 MG tablet, Take 0.5 tablets (20 mg total) by mouth daily as needed for edema or fluid (Please take 20mg  Daily as needed for swelling, or weight gain.). (Patient taking differently: Take 20 mg by mouth daily as needed for edema or fluid.), Disp: 90 tablet, Rfl: 3   ipratropium-albuterol (DUONEB) 0.5-2.5 (3) MG/3ML SOLN, Use 1 vial by nebulization 2 (two) times daily. Use twice a day scheduled, Disp: 360 mL, Rfl: 0   JANUMET 50-500 MG tablet, Take 1 tablet by mouth daily., Disp: , Rfl:    loratadine (CLARITIN) 10 MG tablet, Take 1 tablet (10 mg total) by mouth daily., Disp: 30 tablet, Rfl: 0   LORazepam (ATIVAN) 1 MG tablet, Take 1 tablet (1 mg total) by mouth 2 (two) times daily., Disp: 3 tablet, Rfl: 0   meclizine (ANTIVERT) 25 MG tablet, Take 25 mg by mouth daily as needed for dizziness., Disp: , Rfl:    metoprolol succinate (TOPROL-XL) 50 MG 24 hr tablet, TAKE 1 TABLET(50 MG) BY MOUTH DAILY WITH OR IMMEDIATELY FOLLOWING A MEAL (Patient taking differently: 50 mg daily.), Disp: 90 tablet, Rfl: 3   ONETOUCH DELICA LANCETS 81K MISC, , Disp: , Rfl:    ONETOUCH VERIO test strip, , Disp: , Rfl:    oxyCODONE-acetaminophen (PERCOCET) 10-325 MG tablet, Take 1 tablet by mouth 2 (two) times daily as needed for pain., Disp: 3 tablet, Rfl:  0   pantoprazole (PROTONIX) 40 MG tablet, Take 40 mg by mouth 2 (two) times daily., Disp: , Rfl:    potassium chloride SA (KLOR-CON) 20 MEQ tablet, TAKE 2 TABLET BY MOUTH DAILY, Disp: 180 tablet, Rfl: 6   PROAIR HFA 108 (90 Base) MCG/ACT inhaler, Inhale 2 puffs into the lungs every 4 (four) hours as needed for wheezing or shortness of breath. Uses as rescue inhaler Reported on 06/20/2015, Disp: 6.7 g, Rfl: 0   promethazine (PHENERGAN) 12.5 MG tablet, Take 1 tablet (12.5 mg total) by mouth every 6 (six) hours as needed for nausea., Disp: 20 tablet, Rfl: 0   Tiotropium Bromide Monohydrate (SPIRIVA RESPIMAT) 2.5 MCG/ACT AERS, Inhale 2 puffs into the lungs daily., Disp: 8 g, Rfl: 0   traZODone (DESYREL) 50 MG tablet, Take 75 mg by mouth at bedtime., Disp: , Rfl:

## 2021-04-23 NOTE — Progress Notes (Signed)
Patient seen in the office today and instructed on use of Spiriva 2.11mcg.  Patient expressed understanding and demonstrated technique.  Benetta Spar [facility] 04/23/2021

## 2021-04-24 ENCOUNTER — Other Ambulatory Visit: Payer: Self-pay | Admitting: *Deleted

## 2021-04-25 ENCOUNTER — Ambulatory Visit
Admit: 2021-04-25 | Discharge: 2021-04-25 | Disposition: A | Discharge status: Home / Self Care | Payer: Self-pay | Attending: Endocrinology | Admitting: Endocrinology

## 2021-04-25 DIAGNOSIS — Z1231 Encounter for screening mammogram for malignant neoplasm of breast: Secondary | ICD-10-CM

## 2021-04-27 ENCOUNTER — Ambulatory Visit
Admission: RE | Admit: 2021-04-27 | Discharge: 2021-04-27 | Disposition: A | Discharge status: Home / Self Care | Payer: Self-pay | Source: Ambulatory Visit | Attending: Neurology | Admitting: Neurology

## 2021-04-27 ENCOUNTER — Other Ambulatory Visit: Payer: Self-pay

## 2021-04-27 DIAGNOSIS — R2681 Unsteadiness on feet: Secondary | ICD-10-CM

## 2021-04-27 DIAGNOSIS — R42 Dizziness and giddiness: Secondary | ICD-10-CM

## 2021-04-27 DIAGNOSIS — R292 Abnormal reflex: Secondary | ICD-10-CM

## 2021-04-27 DIAGNOSIS — R413 Other amnesia: Secondary | ICD-10-CM

## 2021-04-29 ENCOUNTER — Other Ambulatory Visit: Payer: PPO

## 2021-04-30 ENCOUNTER — Telehealth: Payer: Self-pay

## 2021-04-30 NOTE — Telephone Encounter (Signed)
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 Proceed with vestibular therapy that starts on Thursday  and stated she is done with  physical therapy

## 2021-04-30 NOTE — Telephone Encounter (Signed)
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 Proceed with vestibular therapy and continue physical therapy. thanks

## 2021-05-01 ENCOUNTER — Encounter: Payer: Self-pay | Admitting: Pulmonary Disease

## 2021-05-03 ENCOUNTER — Ambulatory Visit: Payer: Medicare Other | Admitting: Physical Therapy

## 2021-05-03 NOTE — Progress Notes (Signed)
Cardiology Office Note   Date:  05/09/2021   ID:  Nicole [name], DOB [birth year], MRN [id number]  PCP:  [name], MD  Cardiologist:   [name], MD   Chief Complaint  Patient presents with   Coronary Artery Disease    History of Present Illness: Nicole [name] is a 76 y.o. female who presents for follow up CHF. She has a PMH of coronary artery disease status post PCI, acute combined systolic and diastolic CHF, diabetes mellitus, depression, GERD, DOE, cardiomyopathy, and aphasia.    She was diagnosed with COVID-19 pneumonia 4/21.  She presented to the emergency department on 10/16/2019 with complaints of progressively worsening shortness of breath, DOE, and generalized weakness.  On arrival to the ED her high-sensitivity troponins were slightly elevated.  Her EKG showed sinus tachycardia with left atrial enlargement and possible lateral injury.  Her CXR showed cardiomegaly.  She was also noted to have some ST elevation in V3-V6 and LVH.  Her echocardiogram 8 /7 showed an EF of 20-25%,Global hypokinesis, G2 DD.  She underwent cardiac catheterization on 10/21/2019 which showed first marginal 95% stenosed with no other significant stenosis. OM was treated with DES.   She was managed medically with DAPT, metoprolol, diuresis, Entresto and Farxiga. Seen in office 8/31 with improvement.   She was admitted in early October 2021 with acute severe sepsis. Unknown source. Had a troponin leak felt to be due to demand ischemia. Repeat Echo showed EF 25-30%. Managed again medically. Entresto and Toprol doses increased.  Wilder Glade was held.  She was readmitted on November 1 with urosepsis. Treated with antibiotics. Had vertigo symptoms with negative cranial CT and MRI.   She was admitted from 6/19-6/22 with failure to thrive and AKI. and has done poorly since then with progressive weight loss. Her CHF meds were held due to hypotension and her creatinine normalized with hydration. Sent home with  palliative care follow up and meds were resumed at discharge.   Repeat Echo on 08/29/20 showed normalization of EF to 60-65%. Her heart medication was resumed.   She was admitted in early Jan 2023 with Covid 19 PNA and hypoxemia. Treated with steroids and remdesivir.    On follow up today she is seen with her daughter. She still notes some SOB at times. Is wearing oxygen at night. Following with pulmonary. Also seeing Neuro for balance issues and vestibular training.  No edema. Weight has gone up since on steroids. 10 lbs.   Past Medical History:  Diagnosis Date   Anemia    Anxiety    Back pain    CAD (coronary artery disease) 10/22/2019   DES to [facility] August 2021   Colon polyps    COVID-28 June 2019   Endometriosis    Fibromyalgia    GERD with esophagitis    Follows with Dr. 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 mellitus ([place])    Type 2 diabetes mellitus ([place])    Urinary incontinence     Past Surgical History:  Procedure Laterality Date   ABDOMINAL HYSTERECTOMY     TAH BSO   APPENDECTOMY  1999   CATARACT EXTRACTION     both   CHOLECYSTECTOMY     CORONARY STENT INTERVENTION N/A 10/21/2019   Procedure: CORONARY STENT INTERVENTION;  Surgeon: Troy Sine, MD;  Location: [facility];  Service: Cardiovascular;  Laterality: N/A;   DENTAL SURGERY     HERNIA REPAIR  2000   LAPAROSCOPIC ENDOMETRIOSIS FULGURATION     RIGHT/LEFT HEART CATH AND CORONARY ANGIOGRAPHY N/A 10/21/2019   Procedure: RIGHT/LEFT HEART CATH AND CORONARY ANGIOGRAPHY;  Surgeon: Troy Sine, MD;  Location: [facility];  Service: Cardiovascular;  Laterality: N/A;     Current Outpatient Medications  Medication Sig Dispense Refill   albuterol (PROVENTIL) (2.5 MG/3ML) 0.083% nebulizer solution Use 1 vial (2.5 mg total) by  nebulization every 6 (six) hours as needed for wheezing or shortness of breath. 90 mL 2   aspirin 81 MG tablet Take 81 mg by mouth daily.     atorvastatin (LIPITOR) 80 MG tablet TAKE 1 TABLET(80 MG) BY MOUTH AT BEDTIME (Patient taking differently: Take 80 mg by mouth at bedtime.) 30 tablet 11   DULoxetine (CYMBALTA) 30 MG capsule Take 30 mg by mouth daily.     ENTRESTO 97-103 MG TAKE 1 TABLET BY MOUTH TWICE DAILY (Patient taking differently: 1 tablet 2 (two) times daily.) 60 tablet 5   ergocalciferol (VITAMIN D2) 50000 UNITS capsule Take 50,000 Units by mouth once a week.     fluticasone (FLONASE) 50 MCG/ACT nasal spray Place 1 spray into both nostrils daily as needed for allergies or rhinitis.      Fluticasone-Salmeterol (ADVAIR) 250-50 MCG/DOSE AEPB Inhale 1 puff into the lungs 2 (two) times daily.     furosemide (LASIX) 40 MG tablet Take 0.5 tablets (20 mg total) by mouth daily as needed for edema or fluid (Please take 20mg  Daily as needed for swelling, or weight gain.). (Patient taking differently: Take 20 mg by mouth daily as needed for edema or fluid.) 90 tablet 3   ipratropium-albuterol (DUONEB) 0.5-2.5 (3) MG/3ML SOLN Use 1 vial by nebulization 2 (two) times daily. Use twice a day scheduled 360 mL 0   JANUMET 50-500 MG tablet Take 1 tablet by mouth daily.     loratadine (CLARITIN) 10 MG tablet Take 1 tablet (10 mg total) by mouth daily. 30 tablet 0   LORazepam (ATIVAN) 1 MG tablet Take 1 tablet (1 mg total) by mouth 2 (two) times daily. 3 tablet 0   MAGNESIUM-OXIDE 400 (240 Mg) MG tablet Take 1 tablet by mouth daily as needed.     meclizine (ANTIVERT) 25 MG tablet Take 25 mg by mouth daily as needed for dizziness.     metoprolol succinate (TOPROL-XL) 50 MG 24 hr tablet TAKE 1 TABLET(50 MG) BY MOUTH DAILY WITH OR IMMEDIATELY FOLLOWING A MEAL (Patient taking differently: 50 mg daily.) 90 tablet 3   ONETOUCH DELICA LANCETS 65K MISC      ONETOUCH VERIO test strip      oxyCODONE-acetaminophen  (PERCOCET) 10-325 MG tablet Take 1 tablet by mouth 2 (two) times daily as needed for pain. 3 tablet 0   pantoprazole (PROTONIX) 40 MG tablet Take 40 mg by mouth 2 (two) times daily.     potassium chloride SA (KLOR-CON) 20 MEQ tablet TAKE 2 TABLET BY MOUTH DAILY 180 tablet 6   PROAIR HFA 108 (90 Base) MCG/ACT inhaler Inhale 2 puffs into the lungs every 4 (four) hours as needed for wheezing or shortness of breath. Uses as rescue inhaler Reported on 06/20/2015 6.7 g 0   promethazine (PHENERGAN) 12.5 MG tablet Take 1 tablet (12.5 mg total) by mouth every 6 (six) hours as needed for nausea. [place]  tablet 0   spironolactone (ALDACTONE) 25 MG tablet Take 0.5 tablets (12.5 mg total) by mouth daily. 90 tablet 3   Tiotropium Bromide Monohydrate (SPIRIVA RESPIMAT) 2.5 MCG/ACT AERS Inhale 2 puffs into the lungs daily. 8 g 0   traZODone (DESYREL) 50 MG tablet Take 75 mg by mouth at bedtime.     No current facility-administered medications for this visit.    Allergies:   Fish-derived products, Shellfish allergy, Aspirin, Benadryl [diphenhydramine], Ivp dye [iodinated contrast media], and Latex    Social History:  The patient  reports that she has never smoked. She has never used smokeless tobacco. She reports that she does not currently use alcohol. She reports that she does not use drugs.   Family History:  The patient's family history includes Cancer in her brother, brother, paternal aunt, and paternal uncle; Diabetes in her daughter, father, and mother; Heart attack in her father; Heart failure in her father and mother; Hypertension in her father, maternal grandfather, maternal grandmother, mother, paternal grandfather, and paternal grandmother; Stroke in her father; Thyroid disease in her brother.    ROS:  Please see the history of present illness.   Otherwise, review of systems are positive for none.   All other systems are reviewed and negative.    PHYSICAL EXAM: VS:  BP (!) 158/90    Pulse 74    Ht 5\' 2"   (1.575 m)    Wt 170 lb 6.4 oz (77.3 kg)    SpO2 98%    BMI 31.17 kg/m  , BMI Body mass index is 31.17 kg/m. GEN: Well nourished, well developed, in no acute distress  HEENT: normal  Neck: no JVD, carotid bruits, or masses Cardiac: RRR; no murmurs, rubs, or gallops,no edema  Respiratory:  clear to auscultation bilaterally, normal work of breathing GI: soft, nontender, nondistended, + BS MS: no deformity or atrophy  Skin: warm and dry, no rash Neuro:  Strength and sensation are intact Psych: euthymic mood, full affect   EKG:  EKG is not ordered today. The ekg ordered today demonstrates N/A   Recent Labs: 03/16/2021: ALT 20; B Natriuretic Peptide 67.4; BUN 21; Creatinine, Ser 1.17; Hemoglobin 10.8; Magnesium 1.5; Platelets 282; Potassium 4.7; Sodium 136 03/26/2021: TSH 1.19    Lipid Panel    Component Value Date/Time   CHOL 68 12/16/2019 0237   TRIG 123 12/16/2019 0237   HDL 22 (L) 12/16/2019 0237   CHOLHDL 3.1 12/16/2019 0237   VLDL 25 12/16/2019 0237   LDLCALC 21 12/16/2019 0237      Wt Readings from Last 3 Encounters:  05/09/21 170 lb 6.4 oz (77.3 kg)  04/23/21 168 lb 3.2 oz (76.3 kg)  03/26/21 164 lb 3.2 oz (74.5 kg)    Labs dated 09/14/20: BUn 28, creatinine 1.5. cholesterol 124, triglycerides 144, HDL 33, LDL 57. A1c 7%. Otherwise CMET normal. Dated 01/30/21: cholesterol 125, triglycerides 183, HDL 42, LDL 46.  Dated 03/16/21: glucose 190. Creatinine 1.13. A1c 6.2%. otherwise CMET normal.  Other studies Reviewed: Additional studies/ records that were reviewed today include:   Cardiac cath/PCI 10/21/19:  CORONARY STENT INTERVENTION  RIGHT/LEFT HEART CATH AND CORONARY ANGIOGRAPHY  Conclusion    1st Mrg lesion is 95% stenosed. Post intervention, there is a 0% residual stenosis. Prox RCA to Mid RCA lesion is 20% stenosed. Prox LAD lesion is 20% stenosed. Mid LAD lesion is 40% stenosed. A stent was successfully placed.   Coronary obstructive disease with 20%  proximal and 40% smooth mid LAD stenoses;  30% proximal circumflex stenosis with 95% near ostial stenosis in a bifurcating OM1 vessel; and 20% mid RCA narrowing.   Normal to minimally increased right heart pressures; mean PA pressure 21 mmHg   Successful PCI to 95% very proximal circumflex marginal stenosis with insertion of a 2.25 x 8 mm DES stent with the stenosis being reduced to 0%.   RECOMMENDATION: Recommend DAPT for 12 months.  Guideline directed medical therapy for the patient's significant cardiomyopathy which is out of proportion to her high-grade circumflex marginal stenosis.  Aggressive lipid-lowering therapy with target LDL less than 70.  Echo 12/13/19: IMPRESSIONS     1. EF similar to August diffuse hypokinesis worse in the septum , apex  and inferior walls Basal lateral wall only area with preserved function .  Left ventricular ejection fraction, by estimation, is 25 to 30%. The left  ventricle has severely decreased  function. The left ventricle demonstrates regional wall motion  abnormalities (see scoring diagram/findings for description). The left  ventricular internal cavity size was moderately dilated. Left ventricular  diastolic parameters were normal.   2. Right ventricular systolic function was not well visualized. The right  ventricular size is not well visualized. There is mildly elevated  pulmonary artery systolic pressure.   3. The mitral valve is normal in structure. Trivial mitral valve  regurgitation. No evidence of mitral stenosis.   4. Tricuspid valve regurgitation is moderate.   5. The aortic valve is normal in structure. Aortic valve regurgitation is  not visualized. Mild to moderate aortic valve sclerosis/calcification is  present, without any evidence of aortic stenosis.   6. The inferior vena cava is normal in size with greater than 50%  respiratory variability, suggesting right atrial pressure of 3 mmHg.   Echo 08/29/20: IMPRESSIONS     1. Left  ventricular ejection fraction, by estimation, is 60 to 65%. The  left ventricle has normal function. The left ventricle has no regional  wall motion abnormalities. Left ventricular diastolic parameters are  consistent with Grade I diastolic  dysfunction (impaired relaxation). Elevated left atrial pressure.   2. Right ventricular systolic function is normal. The right ventricular  size is normal. There is normal pulmonary artery systolic pressure.   3. The mitral valve is normal in structure. Trivial mitral valve  regurgitation. No evidence of mitral stenosis.   4. The aortic valve is tricuspid. Aortic valve regurgitation is not  visualized. Mild aortic valve sclerosis is present, with no evidence of  aortic valve stenosis.   5. The inferior vena cava is normal in size with greater than 50%  respiratory variability, suggesting right atrial pressure of 3 mmHg.   ASSESSMENT AND PLAN:  1. Chronic combined systolic/diastolic CHF-EF 09-81% initially. nonischemic.  Underwent cardiac catheterization 10/21/2019 which showed 95% OM lesion was treated with PCI/DES x1, 20% medial RCA, 20% proximal LAD had 40% medial LAD.  It was felt that her cardiomyopathy was out of proportion to her  OM stenosis.   On last Echo in June 2022  EF had normalized to 60-65%.  She is using lasix PRN Continue Entresto and Toprol XL   2. Coronary artery disease-s/p DES of OM 1 in August. She is asymptomatic.  Continue aspirin  continue metoprolol, atorvastatin   3. Essential hypertension-BP high. Will try low dose aldactone 12.5 mg daily.  Continue metoprolol, entresto Will hold potassium since starting aldactone.  If unable to tolerate aldactone would add amlodipine.  Check BMET in one week    4. Type 2  diabetes-A1c 7.0  Followed by PCP   5. Recurrent urosepsis.  Avoid SGLT 2 inhibitors.   6. S/p Covid 9 infection April 2021 - follow pulmonary     Current medicines are reviewed at length with the patient  today.  The patient does not have concerns regarding medicines.  The following changes have been made:  See above  Labs/ tests ordered today include:   No orders of the defined types were placed in this encounter.     Disposition:   FU with me in 6 months  Signed, Kharma Sampsel [place], MD  05/09/2021 9:41 AM    [facility] [address] [place], [place], [address] Phone [phone], Fax [phone]

## 2021-05-09 ENCOUNTER — Ambulatory Visit: Payer: Medicare Other | Admitting: Cardiology

## 2021-05-09 ENCOUNTER — Other Ambulatory Visit: Payer: Self-pay

## 2021-05-09 ENCOUNTER — Encounter: Payer: Self-pay | Admitting: Cardiology

## 2021-05-09 VITALS — BP 158/90 | HR 74 | Ht 62.0 in | Wt 170.4 lb

## 2021-05-09 DIAGNOSIS — I5022 Chronic systolic (congestive) heart failure: Secondary | ICD-10-CM

## 2021-05-09 DIAGNOSIS — I10 Essential (primary) hypertension: Secondary | ICD-10-CM

## 2021-05-09 DIAGNOSIS — E119 Type 2 diabetes mellitus without complications: Secondary | ICD-10-CM | POA: Diagnosis not present

## 2021-05-09 DIAGNOSIS — I2510 Atherosclerotic heart disease of native coronary artery without angina pectoris: Secondary | ICD-10-CM | POA: Diagnosis not present

## 2021-05-09 MED ORDER — SPIRONOLACTONE 25 MG PO TABS: 12.5000 mg | ORAL_TABLET | Freq: Every day | ORAL | 3 refills | Status: DC

## 2021-05-09 NOTE — Addendum Note (Signed)
Addended by: Kathyrn Lass on: 05/09/2021 09:48 AM ? ? Modules accepted: Orders ? ?

## 2021-05-09 NOTE — Patient Instructions (Addendum)
Add aldactone at 12.5 mg daily. We will  check potassium and kidney function in one week ? ?We will follow up in 6 months ? ?

## 2021-05-15 LAB — BASIC METABOLIC PANEL
BUN/Creatinine Ratio: 13 (ref 12–28)
BUN: 10 mg/dL (ref 8–27)
CO2: 24 mmol/L (ref 20–29)
Calcium: 9.6 mg/dL (ref 8.7–10.3)
Chloride: 103 mmol/L (ref 96–106)
Creatinine, Ser: 0.8 mg/dL (ref 0.57–1.00)
Glucose: 149 mg/dL — ABNORMAL HIGH (ref 70–99)
Potassium: 3.6 mmol/L (ref 3.5–5.2)
Sodium: 142 mmol/L (ref 134–144)
eGFR: 78 mL/min/{1.73_m2} (ref >59)

## 2021-05-22 ENCOUNTER — Other Ambulatory Visit: Payer: Self-pay

## 2021-05-22 ENCOUNTER — Ambulatory Visit: Payer: Medicare Other | Attending: Neurology | Admitting: Physical Therapy

## 2021-05-22 ENCOUNTER — Encounter: Payer: Self-pay | Admitting: Physical Therapy

## 2021-05-22 DIAGNOSIS — R42 Dizziness and giddiness: Secondary | ICD-10-CM | POA: Insufficient documentation

## 2021-05-22 DIAGNOSIS — R2681 Unsteadiness on feet: Secondary | ICD-10-CM | POA: Diagnosis present

## 2021-05-22 DIAGNOSIS — R413 Other amnesia: Secondary | ICD-10-CM | POA: Diagnosis not present

## 2021-05-22 DIAGNOSIS — R292 Abnormal reflex: Secondary | ICD-10-CM | POA: Insufficient documentation

## 2021-05-22 DIAGNOSIS — H8112 Benign paroxysmal vertigo, left ear: Secondary | ICD-10-CM | POA: Insufficient documentation

## 2021-05-22 DIAGNOSIS — R2689 Other abnormalities of gait and mobility: Secondary | ICD-10-CM | POA: Insufficient documentation

## 2021-05-22 NOTE — Therapy (Signed)
?OUTPATIENT PHYSICAL THERAPY VESTIBULAR EVALUATION ? ? ? ? ?Patient Name: Nicole [name] ?MRN: [id number] ?DOB:02-11-1946, 76 y.o., female ?Today's Date: 05/22/2021 ? ?PCP: [name], MD ?REFERRING PROVIDER: Cameron Sprang, MD ? ? PT End of Session - 05/22/21 1457   ? ? Visit Number 1   ? Number of Visits 9   ? Date for PT Re-Evaluation 06/22/21   ? Authorization Type [employer] Medicare   ? Authorization Time Period 05-22-21 - 06-22-21   will plan to renew if pt progressing  ? PT Start Time 1318   ? PT Stop Time 1402   ? PT Time Calculation (min) 44 min   ? Activity Tolerance Patient tolerated treatment well   ? Behavior During Therapy [facility] for tasks assessed/performed   ? ?  ?  ? ?  ? ? ?Past Medical History:  ?Diagnosis Date  ? Anemia   ? Anxiety   ? Back pain   ? CAD (coronary artery disease) 10/22/2019  ? DES to [facility] August 2021  ? Colon polyps   ? COVID-19   ? April 2021  ? Endometriosis   ? Fibromyalgia   ? GERD with esophagitis   ? Follows with Dr. Fuller Plan with GI, EGD in the past  ? IBS (irritable bowel syndrome)   ? Major depressive disorder   ? Migraine   ? Mild intermittent asthma   ? Mixed diabetic hyperlipidemia associated with type 2 diabetes mellitus ([place])   ? Type 2 diabetes mellitus ([place])   ? Urinary incontinence   ? ?Past Surgical History:  ?Procedure Laterality Date  ? ABDOMINAL HYSTERECTOMY    ? TAH BSO  ? APPENDECTOMY  1999  ? CATARACT EXTRACTION    ? both  ? CHOLECYSTECTOMY    ? CORONARY STENT INTERVENTION N/A 10/21/2019  ? Procedure: CORONARY STENT INTERVENTION;  Surgeon: Troy Sine, MD;  Location: [facility];  Service: Cardiovascular;  Laterality: N/A;  ? DENTAL SURGERY    ? HERNIA REPAIR  2000  ? LAPAROSCOPIC ENDOMETRIOSIS FULGURATION    ? RIGHT/LEFT HEART CATH AND CORONARY ANGIOGRAPHY N/A 10/21/2019  ? Procedure: RIGHT/LEFT HEART CATH AND CORONARY ANGIOGRAPHY;  Surgeon: Troy Sine, MD;  Location: [facility];  Service: Cardiovascular;  Laterality: N/A;  ? ?Patient Active  Problem List  ? Diagnosis Date Noted  ? COVID 03/11/2021  ? Abnormal gait 03/02/2021  ? Iron deficiency anemia 03/02/2021  ? Ischemic cardiomyopathy 03/02/2021  ? AKI (acute kidney injury) ([place]) 08/27/2020  ? Failure to thrive in adult 08/27/2020  ? Sepsis secondary to UTI ([place]) 01/11/2020  ? Sepsis ([employer]) 01/10/2020  ? Non-ST elevation (NSTEMI) myocardial infarction [facility])   ? Acute respiratory failure with hypoxia ([place])   ? Benzodiazepine withdrawal with complication ([place])   ? Severe sepsis ([place]) 12/12/2019  ? Hyperammonemia ([place]) 12/12/2019  ? Chest pain in adult 12/12/2019  ? Nystagmus 12/11/2019  ? Leukocytosis 12/11/2019  ? Chronic combined systolic and diastolic congestive heart failure ([place]) 12/11/2019  ? Type 2 diabetes mellitus ([place]) 12/11/2019  ? Acute combined systolic and diastolic congestive heart failure ([place]) 10/22/2019  ? CAD S/P percutaneous coronary angioplasty 10/22/2019  ? Aphasia 10/21/2019  ? Elevated troponin   ? Cardiomyopathy ([place])   ? DOE (dyspnea on exertion) 10/16/2019  ? Mild intermittent asthma with (acute) exacerbation 06/24/2019  ? COVID-19 virus infection 06/24/2019  ? Acute metabolic encephalopathy 78/29/5621  ? Hypokalemia, inadequate intake 06/24/2019  ? Hypomagnesemia 06/24/2019  ? GERD with esophagitis   ?  Major depressive disorder   ? Mixed diabetic hyperlipidemia associated with type 2 diabetes mellitus ([place])   ? Chronic daily headache 12/07/2014  ? Memory loss 08/31/2014  ? Worsening headaches 08/31/2014  ? 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 Atypical chest pain 11/05/2013  ? Anemia   ? Anxiety   ? Depression   ? Diabetes mellitus type 2 in obese [facility])   ? Urinary incontinence   ? Endometriosis   ? Asthma   ? Fibromyalgia   ? Vitamin D deficiency 10/23/2009  ? Migraine 01/25/2009  ? ? ?ONSET DATE: April 2021 ? ?REFERRING DIAG: R26.81 (ICD-10-CM) - Gait instability R42 (ICD-10-CM) - Vertigo R29.2 (ICD-10-CM) - Hyperreflexia R41.3 (ICD-10-CM) - Memory loss  ? ?THERAPY  DIAG:  ?BPPV (benign paroxysmal positional vertigo), left ? ?Dizziness and giddiness ? ?Other abnormalities of gait and mobility ? ?SUBJECTIVE:  ? ?SUBJECTIVE STATEMENT: ?Pt accompanied to PT by her daughter, [name].  She reports pt had imbalance prior to April 2021 at which time she had Covid -pt had dizziness after having had Covid in April.  Daughter states imbalance is much worse since Covid.  Pt had Covid for 2nd time in Jan. 2023. Pt reports looking up, to the sides and getting up brings on the dizziness.  Daughter states MRI was done to rule out cervical myelopathy and it was negative.  Pt reports she had home health PT approx. 6 months ago to address balance deficits - states this was helpful but they did not address the dizziness. Pt reports she saw ENT in [facility] who was not helpful in diagnosing the dizziness - states he wanted to refer her to [facility] for hearing test.  Pt reports she is scheduled to see ophthalmologist in 2 weeks (currently has blurred vision and is unable to read her mail). ?Pt accompanied by: family member - daughter, [name] ? ?PERTINENT HISTORY: hypertension, CHF, DM, fibromyalgia with chronic pain, anxiety, CAD, previously seen in our office for chronic daily headaches and memory loss  ? ? ?PAIN:  ?Are you having pain? No ? ?PRECAUTIONS: Fall ? ?WEIGHT BEARING RESTRICTIONS No ? ?FALLS: Has patient fallen in last 6 months? Yes, Number of falls: 1-2 ? ?LIVING ENVIRONMENT: ?Lives with: lives with their family and lives alone ?Lives in: Mobile home ?Stairs: No;  ?Has following equipment at home: Single point cane, Walker - 2 wheeled, and bed side commode ? ?PLOF: Independent ? ?PATIENT GOALS: Be able to drive again, reduce the dizziness  ? ?OBJECTIVE:  ? ?DIAGNOSTIC FINDINGS: MRI was done to rule out cervical myelopathy - was negative ? ?COGNITION: ?Overall cognitive status: Impaired: Memory: Deficits per Dr. Amparo [place] chart note ?  ?SENSATION: ?WFL ? ? ? ?Cervical ROM:   WFL's ? ? ?STRENGTH: WNL's ? ? ? ?BED MOBILITY:  ?Sit to supine Complete Independence ?Supine to sit Complete Independence ?Rolling to Right Complete Independence ?Rolling to Left Complete Independence ? ?TRANSFERS: ?Assistive device utilized: None  ?Sit to stand: Modified independence - slow and cautious due to c/o lightheadedness upon initial sit to stand ?Stand to sit: Complete Independence ? ?GAIT: ?Gait pattern: step through pattern ?Distance walked: 27' ?Assistive device utilized: None ?Level of assistance: Modified independence ?Comments: speed varies depending on dizziness ? ?PATIENT SURVEYS:  ?FOTO was not captured by front office ? ? ?VESTIBULAR ASSESSMENT ? ? GENERAL OBSERVATION: Pt is a 76 yr old lady with c/o imbalance and dizziness. Pt is not using device today but states she uses a RW at home "when  I need it".  Pt reports she usually uses it when walking outside.   ?  ? SYMPTOM BEHAVIOR: ?  Subjective history: Dizziness started after Covid in April 2021; daughter states imbalance was occurring prior to this illness but worsened after Covid was contracted.  Dizziness started after Covid.   ?  Non-Vestibular symptoms: diplopia, neck pain, headaches, and occasional tinnitus, migraines ?  Type of dizziness: Blurred Vision, Diplopia, Imbalance (Disequilibrium), Unsteady with head/body turns, and Lightheadedness/Faint ?  Frequency: daily most of the time ?  Duration: minutes to hours ?  Aggravating factors: Induced by position change: rolling to the left, Induced by motion: occur when walking, looking up at the ceiling, bending down to the ground, and turning body quickly, Worse in the dark, and Worse outside or in busy environment ?  Relieving factors: rest and Meclizine daily - pt states medicine doesn't help ?  Progression of symptoms: better ? ? OCULOMOTOR EXAM: ?  Ocular Alignment: normal ?  Ocular ROM: No Limitations ?  Spontaneous Nystagmus:  nystagmus - unable to discern direction ?  Gaze-Induced  Nystagmus: left beating with left gaze ?  Smooth Pursuits:  nystagmus noted with horizontal smooth pursuits; pt reported > dizziness with vertical than with horizontal smooth pursuits; > nystagmus with vertical sm

## 2021-05-23 ENCOUNTER — Encounter: Payer: Self-pay | Admitting: Cardiology

## 2021-05-23 DIAGNOSIS — I10 Essential (primary) hypertension: Secondary | ICD-10-CM

## 2021-05-28 ENCOUNTER — Encounter: Payer: Medicare Other | Admitting: Physical Therapy

## 2021-05-29 ENCOUNTER — Other Ambulatory Visit: Payer: Self-pay

## 2021-05-29 MED ORDER — ENTRESTO 97-103 MG PO TABS: 1.0000 | ORAL_TABLET | Freq: Two times a day (BID) | ORAL | 5 refills | Status: DC

## 2021-06-05 ENCOUNTER — Ambulatory Visit: Payer: Medicare Other | Admitting: Physical Therapy

## 2021-06-05 ENCOUNTER — Other Ambulatory Visit: Payer: Self-pay

## 2021-06-05 DIAGNOSIS — H8112 Benign paroxysmal vertigo, left ear: Secondary | ICD-10-CM

## 2021-06-05 DIAGNOSIS — R42 Dizziness and giddiness: Secondary | ICD-10-CM

## 2021-06-05 NOTE — Therapy (Signed)
?OUTPATIENT PHYSICAL THERAPY VESTIBULAR TREATMENT NOTE ? ? ?Patient Name: Nicole [name] ?MRN: [id number] ?DOB:11-27-1945, 76 y.o., female ?Today's Date: 06/05/2021 ? ?PCP: [name], MD ?REFERRING PROVIDER: Cameron Sprang, MD ? ? PT End of Session - 06/05/21 1300   ? ? Visit Number 2   ? Number of Visits 9   ? Date for PT Re-Evaluation 06/29/21   ? Authorization Type UHC Medicare   ? Authorization Time Period 05-22-21 - 06-29-21   will plan to renew if pt progressing  ? PT Start Time 1020   ? PT Stop Time 1059   ? PT Time Calculation (min) 39 min   ? Activity Tolerance Patient tolerated treatment well   ? Behavior During Therapy [facility] for tasks assessed/performed   ? ?  ?  ? ?  ? ? ?Past Medical History:  ?Diagnosis Date  ? Anemia   ? Anxiety   ? Back pain   ? CAD (coronary artery disease) 10/22/2019  ? DES to [facility] August 2021  ? Colon polyps   ? COVID-19   ? April 2021  ? Endometriosis   ? Fibromyalgia   ? GERD with esophagitis   ? Follows with Dr. Fuller Plan with GI, EGD in the past  ? IBS (irritable bowel syndrome)   ? Major depressive disorder   ? Migraine   ? Mild intermittent asthma   ? Mixed diabetic hyperlipidemia associated with type 2 diabetes mellitus ([place])   ? Type 2 diabetes mellitus ([place])   ? Urinary incontinence   ? ?Past Surgical History:  ?Procedure Laterality Date  ? ABDOMINAL HYSTERECTOMY    ? TAH BSO  ? APPENDECTOMY  1999  ? CATARACT EXTRACTION    ? both  ? CHOLECYSTECTOMY    ? CORONARY STENT INTERVENTION N/A 10/21/2019  ? Procedure: CORONARY STENT INTERVENTION;  Surgeon: Troy Sine, MD;  Location: [place] [facility];  Service: Cardiovascular;  Laterality: N/A;  ? DENTAL SURGERY    ? HERNIA REPAIR  2000  ? LAPAROSCOPIC ENDOMETRIOSIS FULGURATION    ? RIGHT/LEFT HEART CATH AND CORONARY ANGIOGRAPHY N/A 10/21/2019  ? Procedure: RIGHT/LEFT HEART CATH AND CORONARY ANGIOGRAPHY;  Surgeon: Troy Sine, MD;  Location: [place] [facility];  Service: Cardiovascular;  Laterality: N/A;  ? ?Patient Active  Problem List  ? Diagnosis Date Noted  ? COVID 03/11/2021  ? Abnormal gait 03/02/2021  ? Iron deficiency anemia 03/02/2021  ? Ischemic cardiomyopathy 03/02/2021  ? AKI (acute kidney injury) ([place]) 08/27/2020  ? Failure to thrive in adult 08/27/2020  ? Sepsis secondary to UTI ([place]) 01/11/2020  ? Sepsis ([place]) 01/10/2020  ? Non-ST elevation (NSTEMI) myocardial infarction [facility])   ? Acute respiratory failure with hypoxia ([place])   ? Benzodiazepine withdrawal with complication ([place])   ? Severe sepsis ([place]) 12/12/2019  ? Hyperammonemia ([place]) 12/12/2019  ? Chest pain in adult 12/12/2019  ? Nystagmus 12/11/2019  ? Leukocytosis 12/11/2019  ? Chronic combined systolic and diastolic congestive heart failure ([place]) 12/11/2019  ? Type 2 diabetes mellitus ([place]) 12/11/2019  ? Acute combined systolic and diastolic congestive heart failure ([place]) 10/22/2019  ? CAD S/P percutaneous coronary angioplasty 10/22/2019  ? Aphasia 10/21/2019  ? Elevated troponin   ? Cardiomyopathy ([place])   ? DOE (dyspnea on exertion) 10/16/2019  ? Mild intermittent asthma with (acute) exacerbation 06/24/2019  ? COVID-19 virus infection 06/24/2019  ? Acute metabolic encephalopathy 19/37/9024  ? Hypokalemia, inadequate intake 06/24/2019  ? Hypomagnesemia 06/24/2019  ? GERD with esophagitis   ?  Major depressive disorder   ? Mixed diabetic hyperlipidemia associated with type 2 diabetes mellitus ([place])   ? Chronic daily headache 12/07/2014  ? Memory loss 08/31/2014  ? Worsening headaches 08/31/2014  ? Mixed bipolar I disorder ([place]) 05/24/2014  ? Atypical chest pain 11/05/2013  ? Anemia   ? Anxiety   ? Depression   ? Diabetes mellitus type 2 in obese [facility])   ? Urinary incontinence   ? Endometriosis   ? Asthma   ? Fibromyalgia   ? Vitamin D deficiency 10/23/2009  ? Migraine 01/25/2009  ? ? ?ONSET DATE: ONSET DATE: April 2021  ? ?REFERRING DIAG: REFERRING DIAG: R26.81 (ICD-10-CM) - Gait instability R42 (ICD-10-CM) - Vertigo R29.2 (ICD-10-CM) - Hyperreflexia R41.3  (ICD-10-CM) - Memory loss   ? ?THERAPY DIAG:  ?BPPV (benign paroxysmal positional vertigo), left ? ?Dizziness and giddiness ? ?PERTINENT HISTORY: hypertension, CHF, DM, fibromyalgia with chronic pain, anxiety, CAD, previously seen in our office for chronic daily headaches and memory loss  ? ?PRECAUTIONS: Fall  ? ?SUBJECTIVE: Pt reports she is unsteady today due to dizziness; daughter, [name], states she was better for a couple of days after previous treatment session ? ?PAIN:  ?Are you having pain? No ? ? ? ? ?OBJECTIVE:  ?  ?DIAGNOSTIC FINDINGS: MRI was done to rule out cervical myelopathy - was negative ?  ?  ?  ?  ?  ?VESTIBULAR ASSESSMENT ?  ?           GENERAL OBSERVATION: Pt is a 76 yr old lady with c/o imbalance and dizziness. Pt is not using device today but states she uses a RW at home "when I need it".  Pt reports she usually uses it when walking outside.   ?            ?           SYMPTOM BEHAVIOR: ?                      Subjective history: Dizziness started after Covid in April 2021; daughter states imbalance was occurring prior to this illness but worsened after Covid was contracted.  Dizziness started after Covid.   ?                      Non-Vestibular symptoms: diplopia, neck pain, headaches, and occasional tinnitus, migraines ?                      Type of dizziness: Blurred Vision, Diplopia, Imbalance (Disequilibrium), Unsteady with head/body turns, and Lightheadedness/Faint ?                      Frequency: daily most of the time ?                      Duration: minutes to hours ?                      Aggravating factors: Induced by position change: rolling to the left, Induced by motion: occur when walking, looking up at the ceiling, bending down to the ground, and turning body quickly, Worse in the dark, and Worse outside or in busy environment ?                      Relieving factors: rest and Meclizine daily - pt states medicine doesn't help ?  Progression of symptoms:  better ?  ?           OCULOMOTOR EXAM: ?                      Ocular Alignment: normal ?                      Ocular ROM: No Limitations ?                      Spontaneous Nystagmus:  nystagmus - unable to discern direction ?                      Gaze-Induced Nystagmus: left beating with left gaze ?                      Smooth Pursuits:  nystagmus noted with horizontal smooth pursuits; pt reported > dizziness with vertical than with horizontal smooth pursuits; > nystagmus with vertical smooth pursuits than with horizontal ?                      Saccades: slow - pt reports > dizziness with looking up ?  ?  ?FRENZEL - FIXATION SUPRESSED: ?                      Ocular Alignment: normal ?                      Spontaneous Nystagmus: direction changing  ?                      Gaze-Induced Nystagmus: left beating with left gaze ?                       ?           VESTIBULAR - OCULAR REFLEX:  NT due to decreased acuity with pt reporting blurred vision ?  ?                      Dynamic Visual Acuity: NT due to decreased acuity with pt reporting blurred vision ?            ?           POSITIONAL TESTING: Right Dix-Hallpike: nystagmus present but not rotary - unable to discern direction; Duration:approx. 20 secs ?Left Dix-Hallpike: upbeating, left nystagmus; Duration: approx. 15 secs ?            ?  ?MOTION SENSITIVITY: ?  ?                      Motion Sensitivity Quotient ?  ?Intensity: 0 = none, 1 = Lightheaded, 2 = Mild, 3 = Moderate, 4 = Severe, 5 = Vomiting ?  Intensity  ?1. Sitting to supine    ?2. Supine to L side    ?3. Supine to R side    ?4. Supine to sitting 3 but short duration  ?5. L Hallpike-Dix 3  ?6. Up from L  3  ?7. R Hallpike-Dix 2  ?8. Up from R  2  ?9. Sitting, head  ?tipped to L knee    ?10. Head up from L  ?knee    ?11. Sitting, head  ?tipped to R knee    ?12. Head up from R  ?  knee    ?13. Sitting head turns x5    ?14.Sitting head nods x5    ?15. In stance, 180?  ?turn to L     ?16. In stance, 180?   ?turn to R    ?            ?  ?  ?VESTIBULAR TREATMENT:  06-05-21 ?  ?Lt Dix-Hallpike (+) with Lt rotary upbeating nystagmus; Rt Dix-Hallpike test (+) with Rt rotary upbeating nystagmus ? ?Canalith Repositioning: ?

## 2021-06-07 ENCOUNTER — Ambulatory Visit: Payer: Medicare Other | Admitting: Physical Therapy

## 2021-06-07 DIAGNOSIS — R2689 Other abnormalities of gait and mobility: Secondary | ICD-10-CM

## 2021-06-07 DIAGNOSIS — R2681 Unsteadiness on feet: Secondary | ICD-10-CM

## 2021-06-07 DIAGNOSIS — H8112 Benign paroxysmal vertigo, left ear: Secondary | ICD-10-CM | POA: Diagnosis not present

## 2021-06-07 NOTE — Therapy (Signed)
?OUTPATIENT PHYSICAL THERAPY VESTIBULAR TREATMENT NOTE ? ? ?Patient Name: Nicole [name] ?MRN: [id number] ?DOB:04-15-1945, 76 y.o., female ?Today's Date: 06/07/2021 ? ?PCP: [name], MD ?REFERRING PROVIDER: Cameron Sprang, MD ? ? PT End of Session - 06/07/21 1845   ? ? Visit Number 3   ? Number of Visits 9   ? Date for PT Re-Evaluation 06/29/21   ? Authorization Type UHC Medicare   ? Authorization Time Period 05-22-21 - 06-29-21   will plan to renew if pt progressing  ? PT Start Time 1535   ? PT Stop Time 4970   ? PT Time Calculation (min) 42 min   ? Activity Tolerance Patient tolerated treatment well   ? Behavior During Therapy [facility] for tasks assessed/performed   ? ?  ?  ? ?  ? ? ? ?Past Medical History:  ?Diagnosis Date  ? Anemia   ? Anxiety   ? Back pain   ? CAD (coronary artery disease) 10/22/2019  ? DES to [facility] August 2021  ? Colon polyps   ? COVID-19   ? April 2021  ? Endometriosis   ? Fibromyalgia   ? GERD with esophagitis   ? Follows with Dr. Fuller Plan with GI, EGD in the past  ? IBS (irritable bowel syndrome)   ? Major depressive disorder   ? Migraine   ? Mild intermittent asthma   ? Mixed diabetic hyperlipidemia associated with type 2 diabetes mellitus ([place])   ? Type 2 diabetes mellitus ([place])   ? Urinary incontinence   ? ?Past Surgical History:  ?Procedure Laterality Date  ? ABDOMINAL HYSTERECTOMY    ? TAH BSO  ? APPENDECTOMY  1999  ? CATARACT EXTRACTION    ? both  ? CHOLECYSTECTOMY    ? CORONARY STENT INTERVENTION N/A 10/21/2019  ? Procedure: CORONARY STENT INTERVENTION;  Surgeon: Troy Sine, MD;  Location: [place] [facility];  Service: Cardiovascular;  Laterality: N/A;  ? DENTAL SURGERY    ? HERNIA REPAIR  2000  ? LAPAROSCOPIC ENDOMETRIOSIS FULGURATION    ? RIGHT/LEFT HEART CATH AND CORONARY ANGIOGRAPHY N/A 10/21/2019  ? Procedure: RIGHT/LEFT HEART CATH AND CORONARY ANGIOGRAPHY;  Surgeon: Troy Sine, MD;  Location: [place] [facility];  Service: Cardiovascular;  Laterality: N/A;  ? ?Patient Active  Problem List  ? Diagnosis Date Noted  ? COVID 03/11/2021  ? Abnormal gait 03/02/2021  ? Iron deficiency anemia 03/02/2021  ? Ischemic cardiomyopathy 03/02/2021  ? AKI (acute kidney injury) ([place]) 08/27/2020  ? Failure to thrive in adult 08/27/2020  ? Sepsis secondary to UTI ([place]) 01/11/2020  ? Sepsis ([place]) 01/10/2020  ? Non-ST elevation (NSTEMI) myocardial infarction [facility])   ? Acute respiratory failure with hypoxia ([place])   ? Benzodiazepine withdrawal with complication ([place])   ? Severe sepsis ([place]) 12/12/2019  ? Hyperammonemia ([place]) 12/12/2019  ? Chest pain in adult 12/12/2019  ? Nystagmus 12/11/2019  ? Leukocytosis 12/11/2019  ? Chronic combined systolic and diastolic congestive heart failure ([place]) 12/11/2019  ? Type 2 diabetes mellitus ([place]) 12/11/2019  ? Acute combined systolic and diastolic congestive heart failure ([place]) 10/22/2019  ? CAD S/P percutaneous coronary angioplasty 10/22/2019  ? Aphasia 10/21/2019  ? Elevated troponin   ? Cardiomyopathy ([place])   ? DOE (dyspnea on exertion) 10/16/2019  ? Mild intermittent asthma with (acute) exacerbation 06/24/2019  ? COVID-19 virus infection 06/24/2019  ? Acute metabolic encephalopathy 26/37/8588  ? Hypokalemia, inadequate intake 06/24/2019  ? Hypomagnesemia 06/24/2019  ? GERD with esophagitis   ?  Major depressive disorder   ? Mixed diabetic hyperlipidemia associated with type 2 diabetes mellitus ([place])   ? Chronic daily headache 12/07/2014  ? Memory loss 08/31/2014  ? Worsening headaches 08/31/2014  ? Mixed bipolar I disorder ([place]) 05/24/2014  ? Atypical chest pain 11/05/2013  ? Anemia   ? Anxiety   ? Depression   ? Diabetes mellitus type 2 in obese [facility])   ? Urinary incontinence   ? Endometriosis   ? Asthma   ? Fibromyalgia   ? Vitamin D deficiency 10/23/2009  ? Migraine 01/25/2009  ? ? ?ONSET DATE: ONSET DATE: April 2021  ? ?REFERRING DIAG: REFERRING DIAG: R26.81 (ICD-10-CM) - Gait instability R42 (ICD-10-CM) - Vertigo R29.2 (ICD-10-CM) - Hyperreflexia R41.3  (ICD-10-CM) - Memory loss   ? ?THERAPY DIAG:  ?BPPV (benign paroxysmal positional vertigo), left ? ?Other abnormalities of gait and mobility ? ?Unsteadiness on feet ? ?PERTINENT HISTORY: hypertension, CHF, DM, fibromyalgia with chronic pain, anxiety, CAD, previously seen in our office for chronic daily headaches and memory loss  ? ?PRECAUTIONS: Fall  ? ?SUBJECTIVE: Pt reports she is feeling better today; states the dizziness comes and goes ? ?PAIN:  ?Are you having pain? No ? ? ? ? ?OBJECTIVE:  ?  ?DIAGNOSTIC FINDINGS: MRI was done to rule out cervical myelopathy - was negative ?  ?  ? Gait:  ? ? [place] Adult PT Treatment/Exercise - 06/07/21 0001   ? ?  ? Standardized Balance Assessment  ? Standardized Balance Assessment Dynamic Gait Index;Timed Up and Go Test   ?  ? Dynamic Gait Index  ? Level Surface Mild Impairment   ? Change in Gait Speed Mild Impairment   ? Gait with Horizontal Head Turns Mild Impairment   ? Gait with Vertical Head Turns Moderate Impairment   ? Gait and Pivot Turn Mild Impairment   ? Step Over Obstacle Mild Impairment   ? Step Around Obstacles Normal   dizziness reported with weaving around cones  ? Steps Mild Impairment   ? Total Score 16   ?  ? Timed Up and Go Test  ? TUG Normal TUG   ? Normal TUG (seconds) 12.79   no device  ? ?  ?  ? ?  ?   ?TherEx:  SciFit level 2.5 x 10" with UE's and LE's  ? ?  ?  ?VESTIBULAR TREATMENT:  06-07-21 ?  ?Lt Dix-Hallpike (+) with Lt rotary upbeating nystagmus; Rt Dix-Hallpike test (-) with no Rt rotary upbeating nystagmus ? ?Canalith Repositioning: ?                      Epley Left: Number of Reps: 2, Response to Treatment:  Improved with no nystagmus on 2nd rep ? ?NeuroRe-ed:  Pt instructed in balance HEP  - Medbridge   [email or web address]         ? TUG score 12.79 secs with no device ? ?Access Code: [email or web address] ?URL: [email or web address] ?Date: 06/07/2021 ?Prepared by: Ethelene Browns ? ?Exercises ?- Standing Hip Flexion  - 1 x daily - 7 x weekly - 3 sets - 10  reps ?- Standing Hip Flexion AROM  - 1 x daily - 7 x weekly - 3 sets - 10 reps ?- Standing Hip Abduction with Counter Support  - 1 x daily - 7 x weekly - 3 sets - 10 reps ?- Standing Hip Extension with Counter Support  - 1 x daily - 7 x weekly - 3 sets - 10 reps ?- Single  Leg Stance with Support  - 1 x daily - 7 x weekly - 1 sets - 2 reps - 10 sec hold ? ?PATIENT EDUCATION: ?Education details: Medbridge ?Person educated: Patient and Child(ren) ?Education method: Explanation & Demonstration ?Education comprehension: verbalized understanding and needs further education ?  ?ASSESSMENT: ?  ?CLINICAL IMPRESSION:  Pt had no nystagmus and no c/o vertigo on 2nd rep of Epley for Lt BPPV.  Pt had (-) Rt Dix-Hallpike with no nystagmus & no c/o vertigo.  Pt is at fall risk per DGI test score 16/24.  No fall risk per TUG score 12.79 secs with no device.  Remainder of session focused on HEP instruction for balance exercises and LE strengthening with use of SciFit.  Cont with POC. ?  ?OBJECTIVE IMPAIRMENTS decreased balance, difficulty walking, and dizziness.  ?  ?ACTIVITY LIMITATIONS cleaning, community activity, driving, laundry, shopping, and yard work.  ?  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  ?  ?  ?REHAB POTENTIAL: Good ?  ?CLINICAL DECISION MAKING: Evolving/moderate complexity ?  ?EVALUATION COMPLEXITY: Moderate ?  ?  ?GOALS: ?Goals reviewed with patient? Yes ?  ?SHORT TERM GOALS: same as LTG's ?  ?  ?LONG TERM GOALS: Target date: 06/26/2021 ?  ?Pt will have a (-) Lt Dix-Hallpike test to indicate resolution of Lt BPPV. ?Baseline: (+)  ?Goal status: INITIAL ?  ?2.  Pt will report at least 50% improvement in dizziness for improved quality of life and safety with mobility. ?Baseline: Pt reports dizziness with head turns and returning to standing ?Goal status:  INITIAL ?  ?3.  Pt will participate in balance assessment when BPPV resolved. ?Baseline: TBA ?Goal status: INITIAL ?  ?4.  Independent in HEP for balance and vestibular exercises as appropriate. ?Baseline: Depend

## 2021-06-12 ENCOUNTER — Ambulatory Visit: Payer: Medicare Other | Attending: Endocrinology | Admitting: Physical Therapy

## 2021-06-12 ENCOUNTER — Encounter: Payer: Self-pay | Admitting: Physical Therapy

## 2021-06-12 DIAGNOSIS — R42 Dizziness and giddiness: Secondary | ICD-10-CM | POA: Insufficient documentation

## 2021-06-12 DIAGNOSIS — R2681 Unsteadiness on feet: Secondary | ICD-10-CM | POA: Insufficient documentation

## 2021-06-12 DIAGNOSIS — H8112 Benign paroxysmal vertigo, left ear: Secondary | ICD-10-CM | POA: Diagnosis present

## 2021-06-12 DIAGNOSIS — R2689 Other abnormalities of gait and mobility: Secondary | ICD-10-CM | POA: Insufficient documentation

## 2021-06-12 NOTE — Therapy (Signed)
?OUTPATIENT PHYSICAL THERAPY VESTIBULAR TREATMENT NOTE ? ? ?Patient Name: Nicole [name] ?MRN: [id number] ?DOB:[birth year], 75 y.o., female ?Today's Date: 06/12/2021 ? ?PCP: [name], MD ?REFERRING PROVIDER: [name], MD ? ? PT End of Session - 06/12/21 2105   ? ? Visit Number 4   ? Number of Visits 9   ? Date for PT Re-Evaluation 06/29/21   ? Authorization Type UHC Medicare   ? Authorization Time Period 05-22-21 - 06-29-21   will plan to renew if pt progressing  ? PT Start Time 1405   ? PT Stop Time 7893   ? PT Time Calculation (min) 41 min   ? Activity Tolerance Patient tolerated treatment well   ? Behavior During Therapy [facility] for tasks assessed/performed   ? ?  ?  ? ?  ? ? ? ? ?Past Medical History:  ?Diagnosis Date  ? Anemia   ? Anxiety   ? Back pain   ? CAD (coronary artery disease) 10/22/2019  ? DES to [facility] August 2021  ? Colon polyps   ? COVID-19   ? April 2021  ? Endometriosis   ? Fibromyalgia   ? GERD with esophagitis   ? Follows with Dr. Fuller Plan with GI, EGD in the past  ? IBS (irritable bowel syndrome)   ? Major depressive disorder   ? Migraine   ? Mild intermittent asthma   ? Mixed diabetic hyperlipidemia associated with type 2 diabetes mellitus ([place])   ? Type 2 diabetes mellitus ([place])   ? Urinary incontinence   ? ?Past Surgical History:  ?Procedure Laterality Date  ? ABDOMINAL HYSTERECTOMY    ? TAH BSO  ? APPENDECTOMY  1999  ? CATARACT EXTRACTION    ? both  ? CHOLECYSTECTOMY    ? CORONARY STENT INTERVENTION N/A 10/21/2019  ? Procedure: CORONARY STENT INTERVENTION;  Surgeon: Troy Sine, MD;  Location: [place] [facility];  Service: Cardiovascular;  Laterality: N/A;  ? DENTAL SURGERY    ? HERNIA REPAIR  2000  ? LAPAROSCOPIC ENDOMETRIOSIS FULGURATION    ? RIGHT/LEFT HEART CATH AND CORONARY ANGIOGRAPHY N/A 10/21/2019  ? Procedure: RIGHT/LEFT HEART CATH AND CORONARY ANGIOGRAPHY;  Surgeon: Troy Sine, MD;  Location: [place] [facility];  Service: Cardiovascular;  Laterality: N/A;  ? ?Patient Active  Problem List  ? Diagnosis Date Noted  ? COVID 03/11/2021  ? Abnormal gait 03/02/2021  ? Iron deficiency anemia 03/02/2021  ? Ischemic cardiomyopathy 03/02/2021  ? AKI (acute kidney injury) ([place]) 08/27/2020  ? Failure to thrive in adult 08/27/2020  ? Sepsis secondary to UTI ([place]) 01/11/2020  ? Sepsis ([place]) 01/10/2020  ? Non-ST elevation (NSTEMI) myocardial infarction [facility])   ? Acute respiratory failure with hypoxia ([place])   ? Benzodiazepine withdrawal with complication ([place])   ? Severe sepsis ([place]) 12/12/2019  ? Hyperammonemia ([place]) 12/12/2019  ? Chest pain in adult 12/12/2019  ? Nystagmus 12/11/2019  ? Leukocytosis 12/11/2019  ? Chronic combined systolic and diastolic congestive heart failure ([place]) 12/11/2019  ? Type 2 diabetes mellitus ([place]) 12/11/2019  ? Acute combined systolic and diastolic congestive heart failure ([place]) 10/22/2019  ? CAD S/P percutaneous coronary angioplasty 10/22/2019  ? Aphasia 10/21/2019  ? Elevated troponin   ? Cardiomyopathy ([place])   ? DOE (dyspnea on exertion) 10/16/2019  ? Mild intermittent asthma with (acute) exacerbation 06/24/2019  ? COVID-19 virus infection 06/24/2019  ? Acute metabolic encephalopathy 81/03/7508  ? Hypokalemia, inadequate intake 06/24/2019  ? Hypomagnesemia 06/24/2019  ? GERD with esophagitis   ?  Major depressive disorder   ? Mixed diabetic hyperlipidemia associated with type 2 diabetes mellitus ([place])   ? Chronic daily headache 12/07/2014  ? Memory loss 08/31/2014  ? Worsening headaches 08/31/2014  ? Mixed bipolar I disorder ([place]) 05/24/2014  ? Atypical chest pain 11/05/2013  ? Anemia   ? Anxiety   ? Depression   ? Diabetes mellitus type 2 in obese [facility])   ? Urinary incontinence   ? Endometriosis   ? Asthma   ? Fibromyalgia   ? Vitamin D deficiency 10/23/2009  ? Migraine 01/25/2009  ? ? ?ONSET DATE: ONSET DATE: April 2021  ? ?REFERRING DIAG: REFERRING DIAG: R26.81 (ICD-10-CM) - Gait instability R42 (ICD-10-CM) - Vertigo R29.2 (ICD-10-CM) - Hyperreflexia R41.3  (ICD-10-CM) - Memory loss   ? ?THERAPY DIAG:  ?BPPV (benign paroxysmal positional vertigo), left ? ?Other abnormalities of gait and mobility ? ?Unsteadiness on feet ? ?PERTINENT HISTORY: hypertension, CHF, DM, fibromyalgia with chronic pain, anxiety, CAD, previously seen in our office for chronic daily headaches and memory loss  ? ?PRECAUTIONS: Fall  ? ?SUBJECTIVE: Pt reports she is not feeling good today - states she woke up not feeling well but felt good yesterday; pt reports she has not had the spinning vertigo since previous session last week ? ?PAIN:  ?Are you having pain? Pt reports she is having a dizzy headache today - rates intensity 6/10 ? ? ?TREATMENT: ? 06-12-21 ? ? ?   ?TherEx:   ?SciFit level 2.5 x 5" with UE's and LE's  ? ?  ?  ?VESTIBULAR TREATMENT:   ?  ?Lt Dix-Hallpike (-) with no nystagmus & no c/o vertigo in test position; Rt Dix-Hallpike test (-) with no Rt rotary upbeating nystagmus & no c/o vertigo in test position ? ?NeuroRe-ed:  Pt performed balance HEP for review - Medbridge  [email or web address] ; pt used UE support on counter as needed for assist with balance-see exercises below        ?  ?Access Code: [email or web address] ?URL: [email or web address] ?Date: 06/07/2021 ?Prepared by: Ethelene Browns ? ?Exercises ?- Standing Hip Flexion  - 1 x daily - 7 x weekly - 3 sets - 10 reps ?- Standing Hip Flexion AROM  - 1 x daily - 7 x weekly - 3 sets - 10 reps ?- Standing Hip Abduction with Counter Support  - 1 x daily - 7 x weekly - 3 sets - 10 reps ?- Standing Hip Extension with Counter Support  - 1 x daily - 7 x weekly - 3 sets - 10 reps ?- Single Leg Stance with Support  - 1 x daily - 7 x weekly - 1 sets - 2 reps - 10 sec hold ? ?Standing Balance: ?Surface: Airex and Floor ?Position: Feet Hip Width Apart ?Completed with: Eyes Open and Eyes Closed; Head Turns x 5 Reps and Head Nods x 5 Reps ?Pt performed standing on floor with feet together with EC for 15 secs - pt had minimal postural sway but no total  LOB ? ?Pt performed standing on Airex with head turns with min assist for balance; with EC without head movement for 10 secs ? ?Pt performed rockerboard exercise with bil. UE support using rails on treadmill 10 reps EC 2 sets 10 reps; 10 reps EO with min assist with minimal UE support on rails ? ?Marching on Airex with min assist with EO 10 reps each leg - no head turns ?   ? ?PATIENT EDUCATION: ?Education details: Medbridge ?Person educated: Patient and Child(ren) ?Education  method: Explanation & Demonstration ?Education comprehension: verbalized understanding and needs further education ?  ?ASSESSMENT: ?  ?CLINICAL IMPRESSION:  Pt had (-) Lt Dix-Hallpike test with no rotary upbeating nystagmus in test position, indicative of resolution of BPPV.  Pt also had (-) Rt Dix-Hallpike test with no rotary nystagmus noted.  Pt does have nystagmus in both Rt & Lt sidelying positions and in Dix-Hallpike positions but direction is indiscernible and pt reports no spinning vertigo.  Pt appears to have decreased vestibular input in maintaining balance as evidenced by postural instability with standing on compliant surface with EC.  Pt reported minimal dizziness at end of session but did report feeling of generalized weakness.  Cont with POC. ?  ?OBJECTIVE IMPAIRMENTS decreased balance, difficulty walking, and dizziness.  ?  ?ACTIVITY LIMITATIONS cleaning, community activity, driving, laundry, shopping, and yard work.  ?  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  ?  ?  ?REHAB POTENTIAL: Good ?  ?CLINICAL DECISION MAKING: Evolving/moderate complexity ?  ?EVALUATION COMPLEXITY: Moderate ?  ?  ?GOALS: ?Goals reviewed with patient? Yes ?  ?SHORT TERM GOALS: same as LTG's ?  ?  ?LONG TERM GOALS: Target date: 06/26/2021 ?  ?Pt will have a (-) Lt Dix-Hallpike test to  indicate resolution of Lt BPPV. ?Baseline: (+)  ?Goal status: INITIAL ?  ?2.  Pt will report at least 50% improvement in dizziness for improved quality of life and safety with mobility. ?Baseline: Pt reports dizziness with h

## 2021-06-14 ENCOUNTER — Other Ambulatory Visit: Payer: Self-pay

## 2021-06-14 ENCOUNTER — Ambulatory Visit: Payer: Medicare Other | Admitting: Physical Therapy

## 2021-06-14 DIAGNOSIS — H8112 Benign paroxysmal vertigo, left ear: Secondary | ICD-10-CM | POA: Diagnosis not present

## 2021-06-14 DIAGNOSIS — R2689 Other abnormalities of gait and mobility: Secondary | ICD-10-CM

## 2021-06-14 DIAGNOSIS — R42 Dizziness and giddiness: Secondary | ICD-10-CM

## 2021-06-14 DIAGNOSIS — R2681 Unsteadiness on feet: Secondary | ICD-10-CM

## 2021-06-14 MED ORDER — SPIRONOLACTONE 25 MG PO TABS: 25.0000 mg | ORAL_TABLET | Freq: Every day | ORAL | 3 refills | Status: DC

## 2021-06-14 NOTE — Therapy (Signed)
?OUTPATIENT PHYSICAL THERAPY VESTIBULAR TREATMENT NOTE ? ? ?Patient Name: Nicole [name] ?MRN: [id number] ?DOB:29-Jun-1945, 76 y.o., female ?Today's Date: 06/14/2021 ? ?PCP: [name], MD ?REFERRING PROVIDER: Ellouise Newer, MD ? ? PT End of Session - 06/14/21 1921   ? ? Visit Number 5   ? Number of Visits 9   ? Date for PT Re-Evaluation 06/29/21   ? Authorization Type UHC Medicare   ? Authorization Time Period 05-22-21 - 06-29-21   will plan to renew if pt progressing  ? PT Start Time 1150   ? PT Stop Time 1230   ? PT Time Calculation (min) 40 min   ? Equipment Utilized During Treatment Gait belt   ? Activity Tolerance Patient tolerated treatment well   ? Behavior During Therapy [facility] for tasks assessed/performed   ? ?  ?  ? ?  ? ? ? ? ? ?Past Medical History:  ?Diagnosis Date  ? Anemia   ? Anxiety   ? Back pain   ? CAD (coronary artery disease) 10/22/2019  ? DES to [facility] August 2021  ? Colon polyps   ? COVID-19   ? April 2021  ? Endometriosis   ? Fibromyalgia   ? GERD with esophagitis   ? Follows with Dr. Fuller Plan with GI, EGD in the past  ? IBS (irritable bowel syndrome)   ? Major depressive disorder   ? Migraine   ? Mild intermittent asthma   ? Mixed diabetic hyperlipidemia associated with type 2 diabetes mellitus ([place])   ? Type 2 diabetes mellitus ([place])   ? Urinary incontinence   ? ?Past Surgical History:  ?Procedure Laterality Date  ? ABDOMINAL HYSTERECTOMY    ? TAH BSO  ? APPENDECTOMY  1999  ? CATARACT EXTRACTION    ? both  ? CHOLECYSTECTOMY    ? CORONARY STENT INTERVENTION N/A 10/21/2019  ? Procedure: CORONARY STENT INTERVENTION;  Surgeon: Troy Sine, MD;  Location: [place] [facility];  Service: Cardiovascular;  Laterality: N/A;  ? DENTAL SURGERY    ? HERNIA REPAIR  2000  ? LAPAROSCOPIC ENDOMETRIOSIS FULGURATION    ? RIGHT/LEFT HEART CATH AND CORONARY ANGIOGRAPHY N/A 10/21/2019  ? Procedure: RIGHT/LEFT HEART CATH AND CORONARY ANGIOGRAPHY;  Surgeon: Troy Sine, MD;  Location: [place] [facility];  Service:  Cardiovascular;  Laterality: N/A;  ? ?Patient Active Problem List  ? Diagnosis Date Noted  ? COVID 03/11/2021  ? Abnormal gait 03/02/2021  ? Iron deficiency anemia 03/02/2021  ? Ischemic cardiomyopathy 03/02/2021  ? AKI (acute kidney injury) ([place]) 08/27/2020  ? Failure to thrive in adult 08/27/2020  ? Sepsis secondary to UTI ([place]) 01/11/2020  ? Sepsis ([place]) 01/10/2020  ? Non-ST elevation (NSTEMI) myocardial infarction [facility])   ? Acute respiratory failure with hypoxia ([place])   ? Benzodiazepine withdrawal with complication ([place])   ? Severe sepsis ([place]) 12/12/2019  ? Hyperammonemia ([place]) 12/12/2019  ? Chest pain in adult 12/12/2019  ? Nystagmus 12/11/2019  ? Leukocytosis 12/11/2019  ? Chronic combined systolic and diastolic congestive heart failure ([place]) 12/11/2019  ? Type 2 diabetes mellitus ([place]) 12/11/2019  ? Acute combined systolic and diastolic congestive heart failure ([place]) 10/22/2019  ? CAD S/P percutaneous coronary angioplasty 10/22/2019  ? Aphasia 10/21/2019  ? Elevated troponin   ? Cardiomyopathy ([place])   ? DOE (dyspnea on exertion) 10/16/2019  ? Mild intermittent asthma with (acute) exacerbation 06/24/2019  ? COVID-19 virus infection 06/24/2019  ? Acute metabolic encephalopathy 51/88/4166  ? Hypokalemia, inadequate intake 06/24/2019  ?  Hypomagnesemia 06/24/2019  ? GERD with esophagitis   ? Major depressive disorder   ? Mixed diabetic hyperlipidemia associated with type 2 diabetes mellitus ([place])   ? Chronic daily headache 12/07/2014  ? Memory loss 08/31/2014  ? Worsening headaches 08/31/2014  ? Mixed bipolar I disorder ([place]) 05/24/2014  ? Atypical chest pain 11/05/2013  ? Anemia   ? Anxiety   ? Depression   ? Diabetes mellitus type 2 in obese [facility])   ? Urinary incontinence   ? Endometriosis   ? Asthma   ? Fibromyalgia   ? Vitamin D deficiency 10/23/2009  ? Migraine 01/25/2009  ? ? ?ONSET DATE: ONSET DATE: April 2021  ? ?REFERRING DIAG: REFERRING DIAG: R26.81 (ICD-10-CM) - Gait instability R42 (ICD-10-CM) -  Vertigo R29.2 (ICD-10-CM) - Hyperreflexia R41.3 (ICD-10-CM) - Memory loss   ? ?THERAPY DIAG:  ?Other abnormalities of gait and mobility ? ?Unsteadiness on feet ? ?Dizziness and giddiness ? ?PERTINENT HISTORY: hypertension, CHF, DM, fibromyalgia with chronic pain, anxiety, CAD, previously seen in our office for chronic daily headaches and memory loss  ? ?PRECAUTIONS: Fall  ? ?SUBJECTIVE: Pt reports she is not feeling good today - states she feels a little worse than she did on Tuesday this week, but says yesterday was a really good day; pt reports the dizziness comes and goes ? ?PAIN:  ?Are you having pain? Pt reports pain in bil. Knees (due to OA)  ?Rating: 9/10 ?Aching, constant ? ? ?TREATMENT:  ? 06-14-21 ? ? ?TherEx:   ?SciFit level 3.0 x 5" with UE's and LE's ? ?Pt performed standing RLE and LLE hip exercises with 3# weight - hip flexion 10 reps with knee extended, 10 reps with knee flexed: hip abduction and hip extension 10 reps each leg with 3# weight   ? ?  ?  ?VESTIBULAR TREATMENT:   ?  ? ?Standing Balance: ?Surface: Airex and Floor ?Position: Feet Hip Width Apart ?Completed with: Eyes Open and Eyes Closed; Head Turns x 5 Reps and Head Nods x 5 Reps ?Pt performed marching on pillows with CGA to min assist - EO 10 reps each  ? ? ?Pt performed rockerboard exercise with bil. UE support using rails on treadmill 10 reps EC 2 sets 10 reps; 10 reps EO with min assist with minimal UE support on rails; pt held rockerboard steady - EC 10 sec hold with CGA; then performed head turns horizontally 5 reps and then vertical 5 reps with CGA to min assist without UE support on rails ? ?Pt performed amb. With head turns horizontal with min to mod assist for balance recovery 30' x 2 reps ?Performed amb. 30' forward with abrupt stop and turn with CGA for balance recovery ?Sit to stand 5 reps EC from mat - feet on floor; then 5 reps EO feet on Airex with CGA to min assist for balance   ? ?PATIENT EDUCATION: ?Education details:  Medbridge [email or web address] ?Exercises ?- Standing Hip Flexion  - 1 x daily - 7 x weekly - 3 sets - 10 reps ?- Standing Hip Flexion AROM  - 1 x daily - 7 x weekly - 3 sets - 10 reps ?- Standing Hip Abduction with Counter Support  - 1 x daily - 7 x weekly - 3 sets - 10 reps ?- Standing Hip Extension with Counter Support  - 1 x daily - 7 x weekly - 3 sets - 10 reps ?- Single Leg Stance with Support  - 1 x daily - 7 x weekly -  1 sets - 2 reps - 10 sec hold ? ?Person educated: Patient and Child(ren) ?Education method: Explanation & Demonstration ?Education comprehension: verbalized understanding and needs further education ?  ?ASSESSMENT: ?  ?CLINICAL IMPRESSION:  Pt continues to have dizziness, appears to be multi-factorial in etiology.  BPPV remains resolved.  Pt has moderate instability with amb. With horizontal head turns and less postural instability with amb. With vertical head turns.  Pt able to take longer steps near end of session, compared to decreased step length at start of session.  Strengthening exercises added to POC due to pt's c/o generalized weakness. Balance is slowly and steadily improving. Cont with POC. ?  ?OBJECTIVE IMPAIRMENTS decreased balance, difficulty walking, and dizziness.  ?  ?ACTIVITY LIMITATIONS cleaning, community activity, driving, laundry, shopping, and yard work.  ?  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  ?  ?  ?REHAB POTENTIAL: Good ?  ?CLINICAL DECISION MAKING: Evolving/moderate complexity ?  ?EVALUATION COMPLEXITY: Moderate ?  ?  ?GOALS: ?Goals reviewed with patient? Yes ?  ?SHORT TERM GOALS: same as LTG's ?  ?  ?LONG TERM GOALS: Target date: 06/26/2021 ?  ?Pt will have a (-) Lt Dix-Hallpike test to indicate resolution of Lt BPPV. ?Baseline: (+)  ?Goal status: INITIAL ?  ?2.  Pt will report at least 50% improvement in  dizziness for improved quality of life and safety with mobility. ?Baseline: Pt reports dizziness with head turns and returning to standing ?Goal status: INITIAL ?  ?3.  Pt will participate in balance assessment when

## 2021-06-15 ENCOUNTER — Ambulatory Visit: Payer: Medicare Other | Admitting: Pulmonary Disease

## 2021-06-15 NOTE — Progress Notes (Deleted)
? ?Synopsis: Referred in February 2023 for COPD by Earle Gell, NP ? ?Subjective:  ? ?PATIENT ID: Nicole [name] GENDER: female DOB: 08/27/1945, MRN: [id number] ? ?HPI ? ?No chief complaint on file. ? ?Nicole [name] is a 76 year old woman, never smoker with history of diabetes, high cholesterol, allergy/sinus trouble, coronary artery disease and asthma who is referred to pulmonary clinic for COPD. ? ?Added Spiriva to advair at last visit.  ? ? ?OV 04/23/21 ?She reports life long history of asthma. She was previously followed by Dr. Bernita Buffy. She is using advair 250-67mg 1 puff twice daily and as needed albuterol. She has nebulizer solution but no machine. She is experiencing cough, shortness of breath and intermittent wheezing which has increased since having covid 19 infection where she was admitted to the hospital 1/2 to 1/6. She is also experiencing brain fog along with vision changes. She is seeing a neurologist for issues with feeling dizzy. She was sent home with oxygen from the hospital. She has issues with reflux that ca wake  her up at night and is followed by GI. She typically lays flat on her side to sleep. ? ?She is a never smoker but has significant second hand smoke exposure in childhood from her parents and then from her husband later in life. 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 Her mother and father had asthma and her brother has COPD/emphysema.  ? ?Past Medical History:  ?Diagnosis Date  ? Anemia   ? Anxiety   ? Back pain   ? CAD (coronary artery disease) 10/22/2019  [facility]  ? [facility]   ? COVID-19   ? April 2021  ? Endometriosis   ? Fibromyalgia   ? GERD with esophagitis   ? Follows with Dr. SFuller Planwith GI, EGD in the past  ? IBS (irritable bowel syndrome)   ? Major depressive disorder   ? Migraine   ? Mild intermittent asthma   ? Mixed diabetic hyperlipidemia associated with type 2 diabetes mellitus ([place]   ? Type 2 diabetes mellitus ([place]   ? Urinary  incontinence   ?  ? ?Family History  ?Problem Relation Age of Onset  ? Hypertension Mother   ? Heart failure Mother   ? Diabetes Mother   ? Hypertension Father   ? Heart failure Father   ? Diabetes Father   ? Heart attack Father   ? Stroke Father   ? Cancer Brother   ?     lung  ? Thyroid disease Brother   ? Cancer Brother   ?     throat  ? Cancer Paternal Aunt   ?     bone  ? Cancer Paternal Uncle   ?     lung  ? Hypertension Maternal Grandmother   ? Hypertension Maternal Grandfather   ? Hypertension Paternal Grandmother   ? Hypertension Paternal Grandfather   ? Diabetes Daughter   ? Colon cancer Neg Hx   ? Stomach cancer Neg Hx   ?  ? ?Social History  ? ?Socioeconomic History  ? Marital status: Widowed  ?  Spouse name: Not on file  ? Number of children: 1  ? Years of education: Not on file  ? Highest education level: Not on file  ?Occupational History  ? Occupation: disability  ?Tobacco Use  ? Smoking status: Never  ? Smokeless tobacco: Never  ?Vaping Use  ? Vaping Use: Never used  ?  Substance and Sexual Activity  ? Alcohol use: Not Currently  ?  Comment: occasional  ? Drug use: No  ? Sexual activity: Not Currently  ?  Comment: 1st intercourse 76 yo-Fewer than 5 partners  ?Other Topics Concern  ? Not on file  ?Social History Narrative  ? Right handed   ? ?Social Determinants of Health  ? ?Financial Resource Strain: Not on file  ?Food Insecurity: Not on file  ?Transportation Needs: Not on file  ?Physical Activity: Not on file  ?Stress: Not on file  ?Social Connections: Not on file  ?Intimate Partner Violence: Not on file  ?  ? ?Allergies  ?Allergen Reactions  ? Fish-Derived Products Anaphylaxis  ? Shellfish Allergy Shortness Of Breath  ?  In dye for scan.  ? Aspirin Nausea Only and Other (See Comments)  ?  Pt stated it only gives her problems when she takes the high dose (325 mg)  ? Benadryl [Diphenhydramine]   ?  "flips out with it. Eyes roll to the back of her head."  ? Ivp Dye [Iodinated Contrast Media]   ?  Hx  of ? anaphlaxis  ? Latex Other (See Comments)  ?  Pt says she gets a "stinky infection"  ?  ? ?Outpatient Medications Prior to Visit  ?Medication Sig Dispense Refill  ? albuterol (PROVENTIL) (2.5 MG/3ML) 0.083% nebulizer solution Use 1 vial (2.5 mg total) by nebulization every 6 (six) hours as needed for wheezing or shortness of breath. 90 mL 2  ? aspirin 81 MG tablet Take 81 mg by mouth daily.    ? atorvastatin (LIPITOR) 80 MG tablet TAKE 1 TABLET(80 MG) BY MOUTH AT BEDTIME (Patient taking differently: Take 80 mg by mouth at bedtime.) 30 tablet 11  ? DULoxetine (CYMBALTA) 30 MG capsule Take 30 mg by mouth daily.    ? ergocalciferol (VITAMIN D2) 50000 UNITS capsule Take 50,000 Units by mouth once a week.    ? fluticasone (FLONASE) 50 MCG/ACT nasal spray Place 1 spray into both nostrils daily as needed for allergies or rhinitis.     ? Fluticasone-Salmeterol (ADVAIR) 250-50 MCG/DOSE AEPB Inhale 1 puff into the lungs 2 (two) times daily.    ? furosemide (LASIX) 40 MG tablet Take 0.5 tablets (20 mg total) by mouth daily as needed for edema or fluid (Please take '20mg'$  Daily as needed for swelling, or weight gain.). (Patient taking differently: Take 20 mg by mouth daily as needed for edema or fluid.) 90 tablet 3  ? ipratropium-albuterol (DUONEB) 0.5-2.5 (3) MG/3ML SOLN Use 1 vial by nebulization 2 (two) times daily. Use twice a day scheduled 360 mL 0  ? JANUMET 50-500 MG tablet Take 1 tablet by mouth daily.    ? loratadine (CLARITIN) 10 MG tablet Take 1 tablet (10 mg total) by mouth daily. 30 tablet 0  ? LORazepam (ATIVAN) 1 MG tablet Take 1 tablet (1 mg total) by mouth 2 (two) times daily. 3 tablet 0  ? MAGNESIUM-OXIDE 400 (240 Mg) MG tablet Take 1 tablet by mouth daily as needed.    ? meclizine (ANTIVERT) 25 MG tablet Take 25 mg by mouth daily as needed for dizziness.    ? metoprolol succinate (TOPROL-XL) 50 MG 24 hr tablet TAKE 1 TABLET(50 MG) BY MOUTH DAILY WITH OR IMMEDIATELY FOLLOWING A MEAL (Patient taking  differently: 50 mg daily.) 90 tablet 3  ? ONETOUCH DELICA LANCETS 93J MISC     ? ONETOUCH VERIO test strip     ? oxyCODONE-acetaminophen (PERCOCET) 10-325  MG tablet Take 1 tablet by mouth 2 (two) times daily as needed for pain. 3 tablet 0  ? pantoprazole (PROTONIX) 40 MG tablet Take 40 mg by mouth 2 (two) times daily.    ? PROAIR HFA 108 (90 Base) MCG/ACT inhaler Inhale 2 puffs into the lungs every 4 (four) hours as needed for wheezing or shortness of breath. Uses as rescue inhaler Reported on 06/20/2015 6.7 g 0  ? promethazine (PHENERGAN) 12.5 MG tablet Take 1 tablet (12.5 mg total) by mouth every 6 (six) hours as needed for nausea. 20 tablet 0  ? sacubitril-valsartan (ENTRESTO) 97-103 MG Take 1 tablet by mouth 2 (two) times daily. 60 tablet 5  ? spironolactone (ALDACTONE) 25 MG tablet Take 1 tablet (25 mg total) by mouth daily. 90 tablet 3  ? Tiotropium Bromide Monohydrate (SPIRIVA RESPIMAT) 2.5 MCG/ACT AERS Inhale 2 puffs into the lungs daily. 8 g 0  ? traZODone (DESYREL) 50 MG tablet Take 75 mg by mouth at bedtime.    ? ?No facility-administered medications prior to visit.  ? ? ?Review of Systems  ?Constitutional:  Negative for chills, fever, malaise/fatigue and weight loss.  ?HENT:  Negative for congestion, sinus pain and sore throat.   ?Eyes: Negative.   ?Respiratory:  Positive for cough, shortness of breath and wheezing. Negative for hemoptysis and sputum production.   ?Cardiovascular:  Negative for chest pain, palpitations, orthopnea, claudication and leg swelling.  ?Gastrointestinal:  Negative for abdominal pain, heartburn, nausea and vomiting.  ?Genitourinary: Negative.   ?Musculoskeletal:  Negative for joint pain and myalgias.  ?Skin:  Negative for rash.  ?Neurological:  Positive for headaches. Negative for weakness.  ?Endo/Heme/Allergies: Negative.   ?Psychiatric/Behavioral:  Positive for depression. The patient is nervous/anxious.   ? ?Objective:  ? ?There were no vitals filed for this visit. ? ?Physical  Exam ?Constitutional:   ?   General: She is not in acute distress. ?   Appearance: Normal appearance. She is not ill-appearing.  ?HENT:  ?   Head: Normocephalic and atraumatic.  ?Eyes:  ?   General: No scleral ic

## 2021-06-19 ENCOUNTER — Ambulatory Visit: Payer: Medicare Other | Admitting: Physical Therapy

## 2021-06-26 ENCOUNTER — Ambulatory Visit: Payer: Medicare Other | Admitting: Physical Therapy

## 2021-06-26 DIAGNOSIS — R2681 Unsteadiness on feet: Secondary | ICD-10-CM

## 2021-06-26 DIAGNOSIS — R2689 Other abnormalities of gait and mobility: Secondary | ICD-10-CM

## 2021-06-26 DIAGNOSIS — H8112 Benign paroxysmal vertigo, left ear: Secondary | ICD-10-CM | POA: Diagnosis not present

## 2021-06-26 DIAGNOSIS — R42 Dizziness and giddiness: Secondary | ICD-10-CM

## 2021-06-26 NOTE — Therapy (Signed)
?OUTPATIENT PHYSICAL THERAPY VESTIBULAR TREATMENT NOTE ? ? ?Patient Name: Nicole [name] ?MRN: [id number] ?DOB:April 18, 1945, 76 y.o., female ?Today's Date: 06/27/2021 ? ?PCP: [name], MD ?REFERRING PROVIDER: Ellouise Newer, MD ? ? PT End of Session - 06/27/21 1259   ? ? Visit Number 6   ? Number of Visits 9   ? Date for PT Re-Evaluation 06/29/21   ? Authorization Type [employer] Medicare   ? Authorization Time Period 05-22-21 - 06-29-21   will plan to renew if pt progressing  ? PT Start Time 1320   ? PT Stop Time 1400   ? PT Time Calculation (min) 40 min   ? Equipment Utilized During Treatment Gait belt   ? Activity Tolerance Patient tolerated treatment well   ? Behavior During Therapy [facility] for tasks assessed/performed   ? ?  ?  ? ?  ? ? ? ? ? ? ?Past Medical History:  ?Diagnosis Date  ? Anemia   ? Anxiety   ? Back pain   ? CAD (coronary artery disease) 10/22/2019  ? DES to [facility] 2021  ? Colon polyps   ? COVID-19   ? April 2021  ? Endometriosis   ? Fibromyalgia   ? GERD with esophagitis   ? Follows with Dr. Fuller Plan with GI, EGD in the past  ? IBS (irritable bowel syndrome)   ? Major depressive disorder   ? Migraine   ? Mild intermittent asthma   ? Mixed diabetic hyperlipidemia associated with type 2 diabetes mellitus ([place])   ? Type 2 diabetes mellitus ([place])   ? Urinary incontinence   ? ?Past Surgical History:  ?Procedure Laterality Date  ? ABDOMINAL HYSTERECTOMY    ? TAH BSO  ? APPENDECTOMY  1999  ? CATARACT EXTRACTION    ? both  ? CHOLECYSTECTOMY    ? CORONARY STENT INTERVENTION N/A 10/21/2019  ? Procedure: CORONARY STENT INTERVENTION;  Surgeon: Troy Sine, MD;  Location: [place] [facility];  Service: Cardiovascular;  Laterality: N/A;  ? DENTAL SURGERY    ? HERNIA REPAIR  2000  ? LAPAROSCOPIC ENDOMETRIOSIS FULGURATION    ? RIGHT/LEFT HEART CATH AND CORONARY ANGIOGRAPHY N/A 10/21/2019  ? Procedure: RIGHT/LEFT HEART CATH AND CORONARY ANGIOGRAPHY;  Surgeon: Troy Sine, MD;  Location: [place] [facility];  Service:  Cardiovascular;  Laterality: N/A;  ? ?Patient Active Problem List  ? Diagnosis Date Noted  ? COVID 03/11/2021  ? Abnormal gait 03/02/2021  ? Iron deficiency anemia 03/02/2021  ? Ischemic cardiomyopathy 03/02/2021  ? AKI (acute kidney injury) ([place]) 08/27/2020  ? Failure to thrive in adult 08/27/2020  ? Sepsis secondary to UTI ([place]) 01/11/2020  ? Sepsis ([place]) 01/10/2020  ? Non-ST elevation (NSTEMI) myocardial infarction [facility])   ? Acute respiratory failure with hypoxia ([place])   ? Benzodiazepine withdrawal with complication ([place])   ? Severe sepsis ([place]) 12/12/2019  ? Hyperammonemia ([place]) 12/12/2019  ? Chest pain in adult 12/12/2019  ? Nystagmus 12/11/2019  ? Leukocytosis 12/11/2019  ? Chronic combined systolic and diastolic congestive heart failure ([place]) 12/11/2019  ? Type 2 diabetes mellitus ([place]) 12/11/2019  ? Acute combined systolic and diastolic congestive heart failure ([place]) 10/22/2019  ? CAD S/P percutaneous coronary angioplasty 10/22/2019  ? Aphasia 10/21/2019  ? Elevated troponin   ? Cardiomyopathy ([place])   ? DOE (dyspnea on exertion) 10/16/2019  ? Mild intermittent asthma with (acute) exacerbation 06/24/2019  ? COVID-19 virus infection 06/24/2019  ? Acute metabolic encephalopathy 73/71/0626  ? Hypokalemia, inadequate intake  06/24/2019  ? Hypomagnesemia 06/24/2019  ? GERD with esophagitis   ? Major depressive disorder   ? Mixed diabetic hyperlipidemia associated with type 2 diabetes mellitus ([place])   ? Chronic daily headache 12/07/2014  ? Memory loss 08/31/2014  ? Worsening headaches 08/31/2014  ? 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 Atypical chest pain 11/05/2013  ? Anemia   ? Anxiety   ? Depression   ? Diabetes mellitus type 2 in obese [facility])   ? Urinary incontinence   ? Endometriosis   ? Asthma   ? Fibromyalgia   ? Vitamin D deficiency 10/23/2009  ? Migraine 01/25/2009  ? ? ?ONSET DATE: ONSET DATE: April 2021  ? ?REFERRING DIAG: REFERRING DIAG: R26.81 (ICD-10-CM) - Gait instability R42 (ICD-10-CM) -  Vertigo R29.2 (ICD-10-CM) - Hyperreflexia R41.3 (ICD-10-CM) - Memory loss   ? ?THERAPY DIAG:  ?Other abnormalities of gait and mobility ? ?Unsteadiness on feet ? ?Dizziness and giddiness ? ?PERTINENT HISTORY: hypertension, CHF, DM, fibromyalgia with chronic pain, anxiety, CAD, previously seen in our office for chronic daily headaches and memory loss  ? ?PRECAUTIONS: Fall  ? ?SUBJECTIVE: Pt reports she feels her eyes are jumping - states "I want you to look at my eyes"; Nystagmus noted - pt states she thinks it may be "related to allergies" - states it does not happen all the time.  Pt states she did not come to appt last week due to having a migraine.  ? ?PAIN:  ?Are you having pain? Pt reports pain in bil. Knees (due to OA)  ?Rating: 9/10 ?Aching, constant ? ? ?TREATMENT:  ? 06-26-21 ? ? ?TherEx:   ?SciFit level 3.0 x 5" with UE's and LE's ? ?Pt performed standing RLE and LLE hip exercises with red theraband- hip flexion 10 reps with knee extended, : hip abduction and hip extension 10 reps each leg with red theraband ? ?Leg press 50# bil. LE's 3 sets 10 reps   ? ?  ?  ?VESTIBULAR TREATMENT:   ?  ? ?Standing Balance: ?Surface: Airex and Floor ?Position: Feet Hip Width Apart ?Completed with: Eyes Open and Eyes Closed; Head Turns x 5 Reps and Head Nods x 5 Reps ?Pt performed marching on pillows with CGA to min assist - EO 10 reps each  ?Alternate step down with each leg from pillow to floor 5 reps each focusing straight ahead and then 5 reps each leg with head turn to one side only with CGA ? ? ?Pt performed rockerboard exercise with bil. UE support using UE support on // bars -  10 reps EC: 10 reps EO with min assist with minimal UE support on rails; pt held rockerboard steady - EC 10 sec hold with CGA; then performed head turns horizontally 5 reps and then vertical 5 reps with CGA to min assist without UE support on rails ? ? ?   ? ?PATIENT EDUCATION: ?Education details: [employer] [id number] ?Exercises ?- Standing Hip  Flexion  - 1 x daily - 7 x weekly - 3 sets - 10 reps ?- Standing Hip Flexion AROM  - 1 x daily - 7 x weekly - 3 sets - 10 reps ?- Standing Hip Abduction with Counter Support  - 1 x daily - 7 x weekly - 3 sets - 10 reps ?- Standing Hip Extension with Counter Support  - 1 x daily - 7 x weekly - 3 sets - 10 reps ?- Single Leg Stance with Support  - 1 x daily - 7  x weekly - 1 sets - 2 reps - 10 sec hold ? ?Person educated: Patient and Child(ren) ?Education method: Explanation & Demonstration ?Education comprehension: verbalized understanding and needs further education ?  ?ASSESSMENT: ?  ?CLINICAL IMPRESSION:  Pt is progressing towards LTG's; pt does have more postural instability with vertical head turns than with horizontal head turns.  Pt is able to amb. With head turns with moderate sway but demonstrates ability to recover balance.  Cont with POC.  ?  ?OBJECTIVE IMPAIRMENTS decreased balance, difficulty walking, and dizziness.  ?  ?ACTIVITY LIMITATIONS cleaning, community activity, driving, laundry, shopping, and yard work.  ?  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  ?  ?  ?REHAB POTENTIAL: Good ?  ?CLINICAL DECISION MAKING: Evolving/moderate complexity ?  ?EVALUATION COMPLEXITY: Moderate ?  ?  ?GOALS: ?Goals reviewed with patient? Yes ?  ?SHORT TERM GOALS: same as LTG's ?  ?  ?LONG TERM GOALS: Target date: 06/26/2021 ?  ?Pt will have a (-) Lt Dix-Hallpike test to indicate resolution of Lt BPPV. ?Baseline: (+)  ?Goal status: INITIAL ?  ?2.  Pt will report at least 50% improvement in dizziness for improved quality of life and safety with mobility. ?Baseline: Pt reports dizziness with head turns and returning to standing ?Goal status: INITIAL ?  ?3.  Pt will participate in balance assessment when BPPV resolved. ?Baseline: TBA ?Goal status: INITIAL ?   ?4.  Independent in HEP for balance and vestibular exercises as appropriate. ?Baseline: Dependent ?Goal status: INITIAL ?  ?PLAN: ?PT FREQUENCY: 2x/week ?  ?PT DURATION: other: 5 weeks ?  ?PLANNED INTERVENTI

## 2021-06-28 ENCOUNTER — Ambulatory Visit: Payer: Medicare Other | Admitting: Physical Therapy

## 2021-06-28 DIAGNOSIS — H8112 Benign paroxysmal vertigo, left ear: Secondary | ICD-10-CM | POA: Diagnosis not present

## 2021-06-28 DIAGNOSIS — R2689 Other abnormalities of gait and mobility: Secondary | ICD-10-CM

## 2021-06-28 DIAGNOSIS — R2681 Unsteadiness on feet: Secondary | ICD-10-CM

## 2021-06-28 NOTE — Patient Instructions (Signed)
How to Perform the Epley Maneuver The Epley maneuver is an exercise that relieves symptoms of vertigo. Vertigo is the feeling that you or your surroundings are moving when they are not. When you feel vertigo, you may feel like the room is spinning and may have trouble walking. The Epley maneuver is used for a type of vertigo caused by a calcium deposit in a part of the inner ear. The maneuver involves changing head positions to help the deposit move out of the area. You can do this maneuver at home whenever you have symptoms of vertigo. You can repeat it in 24 hours if your vertigo has not gone away. Even though the Epley maneuver may relieve your vertigo for a few weeks, it is possible that your symptoms will return. This maneuver relieves vertigo, but it does not relieve dizziness. What are the risks? If it is done correctly, the Epley maneuver is considered safe. Sometimes it can lead to dizziness or nausea that goes away after a short time. If you develop other symptoms--such as changes in vision, weakness, or numbness--stop doing the maneuver and call your health care provider. Supplies needed: A bed or table. A pillow. How to do the Epley maneuver     Sit on the edge of a bed or table with your back straight and your legs extended or hanging over the edge of the bed or table. Turn your head halfway toward the affected ear or side as told by your health care provider. Lie backward quickly with your head turned until you are lying flat on your back. Your head should dangle (head-hanging position). You may want to position a pillow under your shoulders. Hold this position for at least 30 seconds. If you feel dizzy or have symptoms of vertigo, continue to hold the position until the symptoms stop. Turn your head to the opposite direction until your unaffected ear is facing down. Your head should continue to dangle. Hold this position for at least 30 seconds. If you feel dizzy or have symptoms of  vertigo, continue to hold the position until the symptoms stop. Turn your whole body to the same side as your head so that you are positioned on your side. Your head will now be nearly facedown and no longer needs to dangle. Hold for at least 30 seconds. If you feel dizzy or have symptoms of vertigo, continue to hold the position until the symptoms stop. Sit back up. You can repeat the maneuver in 24 hours if your vertigo does not go away. Follow these instructions at home: For 24 hours after doing the Epley maneuver: Keep your head in an upright position. When lying down to sleep or rest, keep your head raised (elevated) with two or more pillows. Avoid excessive neck movements. Activity Do not drive or use machinery if you feel dizzy. After doing the Epley maneuver, return to your normal activities as told by your health care provider. Ask your health care provider what activities are safe for you. General instructions Drink enough fluid to keep your urine pale yellow. Do not drink alcohol. Take over-the-counter and prescription medicines only as told by your health care provider. Keep all follow-up visits. This is important. Preventing vertigo symptoms Ask your health care provider if there is anything you should do at home to prevent vertigo. He or she may recommend that you: Keep your head elevated with two or more pillows while you sleep. Do not sleep on the side of your affected ear. Get   up slowly from bed. Avoid sudden movements during the day. Avoid extreme head positions or movement, such as looking up or bending over. Contact a health care provider if: Your vertigo gets worse. You have other symptoms, including: Nausea. Vomiting. Headache. Get help right away if you: Have vision changes. Have a headache or neck pain that is severe or getting worse. Cannot stop vomiting. Have new numbness or weakness in any part of your body. These symptoms may represent a serious problem  that is an emergency. Do not wait to see if the symptoms will go away. Get medical help right away. Call your local emergency services (911 in the [place] Do not drive yourself to the hospital. Summary Vertigo is the feeling that you or your surroundings are moving when they are not. The Epley maneuver is an exercise that relieves symptoms of vertigo. If the Epley maneuver is done correctly, it is considered safe. This information is not intended to replace advice given to you by your health care provider. Make sure you discuss any questions you have with your health care provider. Document Revised: 01/26/2020 Document Reviewed: 01/26/2020 Elsevier Patient Education  2023 [employer]vier Inc.    Self Treatment for Left Posterior / Anterior Canalithiasis    Sitting on bed: 1. Turn head 45 left. (a) Lie back slowly, shoulders on pillow, head on bed. (b) Hold __20-30__ seconds. 2. Keeping head on bed, turn head 90 right. Hold _20-30___ seconds. 3. Roll to right, head on 45 angle down toward bed. Hold _20-30___ seconds. 4. Sit up on right side of bed. Repeat _3___ times per session. Do _2___ sessions per day.  Copyright  VHI. All rights reserved.   

## 2021-06-28 NOTE — Therapy (Signed)
?OUTPATIENT PHYSICAL THERAPY VESTIBULAR TREATMENT NOTE & DISCHARGE SUMMARY ? ? ?Patient Name: Nicole [name] ?MRN: [id number] ?DOB:March 16, 1945, 76 y.o., female ?Today's Date: 06/29/2021 ? ?PCP: [name], MD ?REFERRING PROVIDER: Ellouise Newer, MD ? ? PT End of Session - 06/29/21 1251   ? ? Visit Number 7   ? Number of Visits 9   ? Date for PT Re-Evaluation 06/29/21   ? Authorization Type [employer] Medicare   ? Authorization Time Period 05-22-21 - 06-29-21   will plan to renew if pt progressing  ? PT Start Time 1410   pt arrived 68" late  ? PT Stop Time 1450   ? PT Time Calculation (min) 40 min   ? Equipment Utilized During Treatment Gait belt   ? Activity Tolerance Patient tolerated treatment well   ? Behavior During Therapy [facility] for tasks assessed/performed   ? ?  ?  ? ?  ? ? ? ? ? ? ? ?Past Medical History:  ?Diagnosis Date  ? Anemia   ? Anxiety   ? Back pain   ? CAD (coronary artery disease) 10/22/2019  ? DES to [facility] August 2021  ? Colon polyps   ? COVID-19   ? April 2021  ? Endometriosis   ? Fibromyalgia   ? GERD with esophagitis   ? Follows with Dr. Fuller Plan with GI, EGD in the past  ? IBS (irritable bowel syndrome)   ? Major depressive disorder   ? Migraine   ? Mild intermittent asthma   ? Mixed diabetic hyperlipidemia associated with type 2 diabetes mellitus ([place])   ? Type 2 diabetes mellitus ([place])   ? Urinary incontinence   ? ?Past Surgical History:  ?Procedure Laterality Date  ? ABDOMINAL HYSTERECTOMY    ? TAH BSO  ? APPENDECTOMY  1999  ? CATARACT EXTRACTION    ? both  ? CHOLECYSTECTOMY    ? CORONARY STENT INTERVENTION N/A 10/21/2019  ? Procedure: CORONARY STENT INTERVENTION;  Surgeon: Troy Sine, MD;  Location: [place] [facility];  Service: Cardiovascular;  Laterality: N/A;  ? DENTAL SURGERY    ? HERNIA REPAIR  2000  ? LAPAROSCOPIC ENDOMETRIOSIS FULGURATION    ? RIGHT/LEFT HEART CATH AND CORONARY ANGIOGRAPHY N/A 10/21/2019  ? Procedure: RIGHT/LEFT HEART CATH AND CORONARY ANGIOGRAPHY;  Surgeon: Troy Sine, MD;   Location: [place] [facility];  Service: Cardiovascular;  Laterality: N/A;  ? ?Patient Active Problem List  ? Diagnosis Date Noted  ? COVID 03/11/2021  ? Abnormal gait 03/02/2021  ? Iron deficiency anemia 03/02/2021  ? Ischemic cardiomyopathy 03/02/2021  ? AKI (acute kidney injury) ([place]) 08/27/2020  ? Failure to thrive in adult 08/27/2020  ? Sepsis secondary to UTI ([place]) 01/11/2020  ? Sepsis ([place]) 01/10/2020  ? Non-ST elevation (NSTEMI) myocardial infarction [facility])   ? Acute respiratory failure with hypoxia ([place])   ? Benzodiazepine withdrawal with complication ([place])   ? Severe sepsis ([place]) 12/12/2019  ? Hyperammonemia ([place]) 12/12/2019  ? Chest pain in adult 12/12/2019  ? Nystagmus 12/11/2019  ? Leukocytosis 12/11/2019  ? Chronic combined systolic and diastolic congestive heart failure ([place]) 12/11/2019  ? Type 2 diabetes mellitus ([place]) 12/11/2019  ? Acute combined systolic and diastolic congestive heart failure ([place]) 10/22/2019  ? CAD S/P percutaneous coronary angioplasty 10/22/2019  ? Aphasia 10/21/2019  ? Elevated troponin   ? Cardiomyopathy ([place])   ? DOE (dyspnea on exertion) 10/16/2019  ? Mild intermittent asthma with (acute) exacerbation 06/24/2019  ? COVID-19 virus infection 06/24/2019  ?  Acute metabolic encephalopathy 45/40/9811  ? Hypokalemia, inadequate intake 06/24/2019  ? Hypomagnesemia 06/24/2019  ? GERD with esophagitis   ? Major depressive disorder   ? Mixed diabetic hyperlipidemia associated with type 2 diabetes mellitus ([place])   ? Chronic daily headache 12/07/2014  ? Memory loss 08/31/2014  ? Worsening headaches 08/31/2014  ? Mixed bipolar I disorder ([place]) 05/24/2014  ? Atypical chest pain 11/05/2013  ? Anemia   ? Anxiety   ? Depression   ? Diabetes mellitus type 2 in obese [facility])   ? Urinary incontinence   ? Endometriosis   ? Asthma   ? Fibromyalgia   ? Vitamin D deficiency 10/23/2009  ? Migraine 01/25/2009  ? ? ?ONSET DATE: ONSET DATE: April 2021  ? ?REFERRING DIAG: REFERRING DIAG: R26.81 (ICD-10-CM) -  Gait instability R42 (ICD-10-CM) - Vertigo R29.2 (ICD-10-CM) - Hyperreflexia R41.3 (ICD-10-CM) - Memory loss   ? ?THERAPY DIAG:  ?Other abnormalities of gait and mobility ? ?Unsteadiness on feet ? ?PERTINENT HISTORY: hypertension, CHF, DM, fibromyalgia with chronic pain, anxiety, CAD, previously seen in our office for chronic daily headaches and memory loss  ? ?PRECAUTIONS: Fall  ? ?SUBJECTIVE: Pt reports she feels her eyes are jumping - states "I want you to look at my eyes"; Nystagmus noted - pt states she thinks it may be "related to allergies" - states it does not happen all the time.  Pt states she did not come to appt last week due to having a migraine.  ? ?PAIN:  ?Are you having pain? Pt reports pain in bil. Knees (due to OA)  ?Rating: 9/10 ?Aching, constant ? ? ?TREATMENT:  ? 06-28-21 ? ? ?TherEx:   ?SciFit level 3.0 x 5" with UE's and LE's ? ?Pt performed standing RLE and LLE hip exercises with red theraband- hip flexion 10 reps with knee extended, : hip abduction and hip extension 10 reps each leg with red theraband ? ?Leg press 50# bil. LE's 3 sets 10 reps   ? ?  ?  ?VESTIBULAR TREATMENT:   ?  ? ?Standing Balance: ?Surface: Airex and Floor ?Position: Feet Hip Width Apart ?Completed with: Eyes Open and Eyes Closed; Head Turns x 5 Reps and Head Nods x 5 Reps ? ? ? ? ?   ? ?PATIENT EDUCATION: ?Education details: Medbridge [id number]; 06-28-21 - added Medbridge HEP [id number] - theraband exercises and standing on pillow with EO & EC ?Exercises ?- Standing Hip Flexion  - 1 x daily - 7 x weekly - 3 sets - 10 reps ?- Standing Hip Flexion AROM  - 1 x daily - 7 x weekly - 3 sets - 10 reps ?- Standing Hip Abduction with Counter Support  - 1 x daily - 7 x weekly - 3 sets - 10 reps ?- Standing Hip Extension with Counter Support  - 1 x daily - 7 x weekly - 3 sets - 10 reps ?- Single Leg Stance with Support  - 1 x daily - 7 x weekly - 1 sets - 2 reps - 10 sec hold ? ? ?Person educated: Patient and Child(ren) ?Education  method: Explanation & DemonstrationAccess Code: [email or web address] ?URL: [email or web address] ?Date: 06/29/2021 ?Prepared by: Ethelene Browns ? ?Exercises ?- Standing Hip Flexion with Resistance Loop  - 1 x daily - 7 x weekly - 1 sets - 10 reps - 3 sec hold ?- Hip Extension with Resistance Loop  - 1 x daily - 7 x weekly - 1 sets - 10 reps - 3 sec hold ?- Hip  Abduction with Resistance Loop  - 1 x daily - 7 x weekly - 1 sets - 10 reps - 3 sec hold ?- Standing on Foam Pad  - 1 x daily - 7 x weekly - 3 sets - 10 reps ?Education comprehension: verbalized understanding and needs further education ? ? ?  ?ASSESSMENT: ?  ?CLINICAL IMPRESSION:  Pt has met all LTG's; BPPV has resolved but pt continues to have intermittent dizziness which appears to be multi-factorial in etiology.  Gait and balance are WFL's.  Pt is discharged due to goals met.  ?  ?OBJECTIVE IMPAIRMENTS decreased balance, difficulty walking, and dizziness.  ?  ?ACTIVITY LIMITATIONS cleaning, community activity, driving, laundry, shopping, and yard work.  ?  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  ?  ?  ?REHAB POTENTIAL: Good ?  ?CLINICAL DECISION MAKING: Evolving/moderate complexity ?  ?EVALUATION COMPLEXITY: Moderate ?  ?  ?GOALS: ?Goals reviewed with patient? Yes ?  ?SHORT TERM GOALS: same as LTG's ?  ?  ?LONG TERM GOALS: Target date: 06/26/2021 ?  ?Pt will have a (-) Lt Dix-Hallpike test to indicate resolution of Lt BPPV. ?Baseline: (+)  ?Goal status: MET 06-28-21 ?  ?2.  Pt will report at least 50% improvement in dizziness for improved quality of life and safety with mobility. ?Baseline: Pt reports dizziness with head turns and returning to standing ?Goal status: Goal met - pt reports at least 70% improvement - 06-28-21 ?  ?3.  Pt will participate in balance assessment when BPPV  resolved. ?Baseline: TBA ?Goal status: MET 06-28-21 ?  ?4.  Independent in HEP for balance and vestibular exercises as appropriate. ?Baseline: Dependent ?Goal status: MET 06-28-21 ?  ?PLAN: ?PT FREQUENCY: 2x/week ?

## 2021-06-29 ENCOUNTER — Encounter: Payer: Self-pay | Admitting: Physical Therapy

## 2021-07-20 ENCOUNTER — Other Ambulatory Visit (HOSPITAL_COMMUNITY): Payer: Self-pay

## 2021-08-02 LAB — BASIC METABOLIC PANEL
BUN/Creatinine Ratio: 10 — ABNORMAL LOW (ref 12–28)
BUN: 9 mg/dL (ref 8–27)
CO2: 23 mmol/L (ref 20–29)
Calcium: 9.6 mg/dL (ref 8.7–10.3)
Chloride: 105 mmol/L (ref 96–106)
Creatinine, Ser: 0.91 mg/dL (ref 0.57–1.00)
Glucose: 230 mg/dL — ABNORMAL HIGH (ref 70–99)
Potassium: 3.9 mmol/L (ref 3.5–5.2)
Sodium: 143 mmol/L (ref 134–144)
eGFR: 67 mL/min/{1.73_m2} (ref >59)

## 2021-10-03 ENCOUNTER — Ambulatory Visit: Payer: Medicare Other | Admitting: Pulmonary Disease

## 2021-10-03 ENCOUNTER — Encounter: Payer: Self-pay | Admitting: Pulmonary Disease

## 2021-10-03 ENCOUNTER — Ambulatory Visit (INDEPENDENT_AMBULATORY_CARE_PROVIDER_SITE_OTHER): Payer: Medicare Other | Admitting: Pulmonary Disease

## 2021-10-03 VITALS — BP 100/70 | HR 71 | Temp 97.9°F | Ht 61.0 in | Wt 160.0 lb

## 2021-10-03 DIAGNOSIS — J4540 Moderate persistent asthma, uncomplicated: Secondary | ICD-10-CM

## 2021-10-03 DIAGNOSIS — R942 Abnormal results of pulmonary function studies: Secondary | ICD-10-CM

## 2021-10-03 LAB — PULMONARY FUNCTION TEST
DL/VA % pred: 118 %
DL/VA: 5 ml/min/mmHg/L
DLCO cor % pred: 93 %
DLCO cor: 16.2 ml/min/mmHg
DLCO unc % pred: 93 %
DLCO unc: 16.2 ml/min/mmHg
FEF 25-75 Post: 0.99 L/sec
FEF 25-75 Pre: 0.8 L/sec
FEF2575-%Change-Post: 24 %
FEF2575-%Pred-Post: 62 %
FEF2575-%Pred-Pre: 50 %
FEV1-%Change-Post: 6 %
FEV1-%Pred-Post: 69 %
FEV1-%Pred-Pre: 65 %
FEV1-Post: 1.32 L
FEV1-Pre: 1.24 L
FEV1FVC-%Change-Post: 4 %
FEV1FVC-%Pred-Pre: 93 %
FEV6-%Change-Post: 2 %
FEV6-%Pred-Post: 74 %
FEV6-%Pred-Pre: 72 %
FEV6-Post: 1.79 L
FEV6-Pre: 1.75 L
FEV6FVC-%Change-Post: 0 %
FEV6FVC-%Pred-Post: 105 %
FEV6FVC-%Pred-Pre: 104 %
FVC-%Change-Post: 1 %
FVC-%Pred-Post: 70 %
FVC-%Pred-Pre: 69 %
FVC-Post: 1.79 L
FVC-Pre: 1.76 L
Post FEV1/FVC ratio: 74 %
Post FEV6/FVC ratio: 100 %
Pre FEV1/FVC ratio: 71 %
Pre FEV6/FVC Ratio: 100 %
RV % pred: 104 %
RV: 2.2 L
TLC % pred: 93 %
TLC: 4.32 L

## 2021-10-03 NOTE — Patient Instructions (Signed)
Performed Full PFT Today.   ?

## 2021-10-03 NOTE — Patient Instructions (Addendum)
Your breathing tests show a non-specific breathing pattern which may be related to COPD.  Continue on advair 250-35mg 1 puff twice daily  Continue to use albuterol inhaler as needed  We will order you a nebulizer machine and supplies.   Follow up in 6 months

## 2021-10-03 NOTE — Addendum Note (Signed)
Addended by: Alvin Critchley on: 10/03/2021 04:52 PM   Modules accepted: Orders

## 2021-10-03 NOTE — Progress Notes (Signed)
Performed Full PFT Today.   ?

## 2021-10-03 NOTE — Progress Notes (Signed)
Synopsis: Referred in February 2023 for COPD by Earle Gell, NP  Subjective:   PATIENT ID: Byrd Hesselbach GENDER: female DOB: Apr 22, 1945, MRN: [id number]  HPI  Chief Complaint  Patient presents with   Follow-up    Follow-up    Jessice Madill is a 76 year old woman, never smoker with history of diabetes, high cholesterol, allergy/sinus trouble, coronary artery disease and asthma who returns to pulmonary clinic for asthma.  She has been on advair 250-59mg 1 puff twice daily and reports her breathing is in good standing at this time. She did not tolerate the addition of Spiriva at last visit due to nausea.   Her breathing tests today show a non-specific pattern as she has reduced FEV1 and FVC with normal TLC and DLCO.   Initial OV 04/23/21 She reports life long history of asthma. She was previously followed by Dr. GBernita Buffy She is using advair 250-538m 1 puff twice daily and as needed albuterol. She has nebulizer solution but no machine. She is experiencing cough, shortness of breath and intermittent wheezing which has increased since having covid 19 infection where she was admitted to the hospital 1/2 to 1/6. She is also experiencing brain fog along with vision changes. She is seeing a neurologist for issues with feeling dizzy. She was sent home with oxygen from the hospital. She has issues with reflux that ca wake  her up at night and is followed by GI. She typically lays flat on her side to sleep.  She is a never smoker but has significant second hand smoke exposure in childhood from her parents and then from her husband later in life. 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 Her mother and father had asthma and her brother has COPD/emphysema.   Past Medical History:  Diagnosis Date   Anemia    Anxiety    Back pain    CAD (coronary artery disease) 10/22/2019   [facility]   Colon polyps    COVID-28 June 2019   Endometriosis    Fibromyalgia     GERD with esophagitis    Follows with Dr. StFuller Planith GI, EGD in the past   IBS (irritable bowel syndrome)    Major depressive disorder    Migraine    Mild intermittent asthma    Mixed diabetic hyperlipidemia associated with type 2 diabetes mellitus ([place]   Type 2 diabetes mellitus ([place]   Urinary incontinence      Family History  Problem Relation Age of Onset   Hypertension Mother    Heart failure Mother    Diabetes Mother    Hypertension Father    Heart failure Father    Diabetes Father    Heart attack Father    Stroke Father    Cancer Brother        lung   Thyroid disease Brother    Cancer Brother        throat   Cancer Paternal Aunt        bone   Cancer Paternal Uncle        lung   Hypertension Maternal Grandmother    Hypertension Maternal Grandfather    Hypertension Paternal Grandmother    Hypertension Paternal Grandfather    Diabetes Daughter    Colon cancer Neg Hx    Stomach cancer Neg Hx      Social History   Socioeconomic History   Marital status: Widowed  Spouse name: Not on file   Number of children: 1   Years of education: Not on file   Highest education level: Not on file  Occupational History   Occupation: disability  Tobacco Use   Smoking status: Never   Smokeless tobacco: Never  Vaping Use   Vaping Use: Never used  Substance and Sexual Activity   Alcohol use: Not Currently    Comment: occasional   Drug use: No   Sexual activity: Not Currently    Comment: 1st intercourse 76 yo-Fewer than 5 partners  Other Topics Concern   Not on file  Social History Narrative   Right handed    Social Determinants of Health   Financial Resource Strain: Not on file  Food Insecurity: Not on file  Transportation Needs: Not on file  Physical Activity: Not on file  Stress: Not on file  Social Connections: Not on file  Intimate Partner Violence: Not on file     Allergies  Allergen Reactions   Fish-Derived Products Anaphylaxis   Shellfish  Allergy Shortness Of Breath    In dye for scan.   Aspirin Nausea Only and Other (See Comments)    Pt stated it only gives her problems when she takes the high dose (325 mg)   Benadryl [Diphenhydramine]     "flips out with it. Eyes roll to the back of her head."   Ivp Dye [Iodinated Contrast Media]     Hx of ? anaphlaxis   Latex Other (See Comments)    Pt says she gets a "stinky infection"     Outpatient Medications Prior to Visit  Medication Sig Dispense Refill   albuterol (PROVENTIL) (2.5 MG/3ML) 0.083% nebulizer solution Use 1 vial (2.5 mg total) by nebulization every 6 (six) hours as needed for wheezing or shortness of breath. 90 mL 2   fluticasone (FLONASE) 50 MCG/ACT nasal spray Place 1 spray into both nostrils daily as needed for allergies or rhinitis.      Fluticasone-Salmeterol (ADVAIR) 250-50 MCG/DOSE AEPB Inhale 1 puff into the lungs 2 (two) times daily.     loratadine (CLARITIN) 10 MG tablet Take 1 tablet (10 mg total) by mouth daily. 30 tablet 0   aspirin 81 MG tablet Take 81 mg by mouth daily.     atorvastatin (LIPITOR) 80 MG tablet TAKE 1 TABLET(80 MG) BY MOUTH AT BEDTIME (Patient taking differently: Take 80 mg by mouth at bedtime.) 30 tablet 11   DULoxetine (CYMBALTA) 30 MG capsule Take 30 mg by mouth daily.     ergocalciferol (VITAMIN D2) 50000 UNITS capsule Take 50,000 Units by mouth once a week.     furosemide (LASIX) 40 MG tablet Take 0.5 tablets (20 mg total) by mouth daily as needed for edema or fluid (Please take '20mg'$  Daily as needed for swelling, or weight gain.). (Patient taking differently: Take 20 mg by mouth daily as needed for edema or fluid.) 90 tablet 3   ipratropium-albuterol (DUONEB) 0.5-2.5 (3) MG/3ML SOLN Use 1 vial by nebulization 2 (two) times daily. Use twice a day scheduled 360 mL 0   JANUMET 50-500 MG tablet Take 1 tablet by mouth daily.     LORazepam (ATIVAN) 1 MG tablet Take 1 tablet (1 mg total) by mouth 2 (two) times daily. 3 tablet 0    MAGNESIUM-OXIDE 400 (240 Mg) MG tablet Take 1 tablet by mouth daily as needed.     meclizine (ANTIVERT) 25 MG tablet Take 25 mg by mouth daily as needed for dizziness.  metoprolol succinate (TOPROL-XL) 50 MG 24 hr tablet TAKE 1 TABLET(50 MG) BY MOUTH DAILY WITH OR IMMEDIATELY FOLLOWING A MEAL (Patient taking differently: 50 mg daily.) 90 tablet 3   ONETOUCH DELICA LANCETS 90W MISC      ONETOUCH VERIO test strip      oxyCODONE-acetaminophen (PERCOCET) 10-325 MG tablet Take 1 tablet by mouth 2 (two) times daily as needed for pain. 3 tablet 0   pantoprazole (PROTONIX) 40 MG tablet Take 40 mg by mouth 2 (two) times daily.     PROAIR HFA 108 (90 Base) MCG/ACT inhaler Inhale 2 puffs into the lungs every 4 (four) hours as needed for wheezing or shortness of breath. Uses as rescue inhaler Reported on 06/20/2015 6.7 g 0   promethazine (PHENERGAN) 12.5 MG tablet Take 1 tablet (12.5 mg total) by mouth every 6 (six) hours as needed for nausea. 20 tablet 0   sacubitril-valsartan (ENTRESTO) 97-103 MG Take 1 tablet by mouth 2 (two) times daily. 60 tablet 5   spironolactone (ALDACTONE) 25 MG tablet Take 1 tablet (25 mg total) by mouth daily. 90 tablet 3   traZODone (DESYREL) 50 MG tablet Take 75 mg by mouth at bedtime.     Tiotropium Bromide Monohydrate (SPIRIVA RESPIMAT) 2.5 MCG/ACT AERS Inhale 2 puffs into the lungs daily. (Patient not taking: Reported on 10/03/2021) 8 g 0   No facility-administered medications prior to visit.    Review of Systems  Constitutional:  Negative for chills, fever, malaise/fatigue and weight loss.  HENT:  Negative for congestion, sinus pain and sore throat.   Eyes: Negative.   Respiratory:  Negative for cough, hemoptysis, sputum production, shortness of breath and wheezing.   Cardiovascular:  Negative for chest pain, palpitations, orthopnea, claudication and leg swelling.  Gastrointestinal:  Negative for abdominal pain, heartburn, nausea and vomiting.  Genitourinary: Negative.    Musculoskeletal:  Negative for joint pain and myalgias.  Skin:  Negative for rash.  Neurological:  Negative for weakness and headaches.  Endo/Heme/Allergies: Negative.   Psychiatric/Behavioral:  Positive for depression. The patient is nervous/anxious.     Objective:   Vitals:   10/03/21 1615  BP: 100/70  Pulse: 71  Temp: 97.9 F (36.6 C)  TempSrc: Oral  SpO2: 97%  Weight: 160 lb (72.6 kg)  Height: '5\' 1"'$  (1.549 m)   Physical Exam Constitutional:      General: She is not in acute distress.    Appearance: Normal appearance. She is not ill-appearing.  HENT:     Head: Normocephalic and atraumatic.  Eyes:     General: No scleral icterus. Cardiovascular:     Rate and Rhythm: Normal rate and regular rhythm.     Pulses: Normal pulses.     Heart sounds: Normal heart sounds. No murmur heard. Pulmonary:     Effort: Pulmonary effort is normal.     Breath sounds: Normal breath sounds. No wheezing, rhonchi or rales.  Musculoskeletal:     Right lower leg: No edema.     Left lower leg: No edema.  Skin:    General: Skin is warm and dry.  Neurological:     General: No focal deficit present.     Mental Status: She is alert.  Psychiatric:        Mood and Affect: Mood normal.        Behavior: Behavior normal.        Thought Content: Thought content normal.        Judgment: Judgment normal.  CBC    Component Value Date/Time   WBC 8.0 03/16/2021 0201   RBC 3.72 (L) 03/16/2021 0201   HGB 10.8 (L) 03/16/2021 0201   HCT 33.4 (L) 03/16/2021 0201   PLT 282 03/16/2021 0201   MCV 89.8 03/16/2021 0201   MCH 29.0 03/16/2021 0201   MCHC 32.3 03/16/2021 0201   RDW 12.7 03/16/2021 0201   LYMPHSABS 1.1 03/16/2021 0201   MONOABS 0.2 03/16/2021 0201   EOSABS 0.0 03/16/2021 0201   BASOSABS 0.1 03/16/2021 0201      Latest Ref Rng & Units 08/01/2021    3:32 PM 05/14/2021    3:50 PM 03/16/2021    2:01 AM  BMP  Glucose 70 - 99 mg/dL 230  149  242   BUN 8 - 27 mg/dL '9  10  21    '$ Creatinine 0.57 - 1.00 mg/dL 0.91  0.80  1.17   BUN/Creat Ratio 12 - '28 10  13    '$ Sodium 134 - 144 mmol/L 143  142  136   Potassium 3.5 - 5.2 mmol/L 3.9  3.6  4.7   Chloride 96 - 106 mmol/L 105  103  102   CO2 20 - 29 mmol/L '23  24  27   '$ Calcium 8.7 - 10.3 mg/dL 9.6  9.6  9.6    Chest imaging: CXR 03/15/21 There are small linear densities in the lower lung fields, more so on the left side with interval worsening suggesting subsegmental atelectasis. Possible minimal left pleural effusion.  PFT:    Latest Ref Rng & Units 10/03/2021    3:06 PM  PFT Results  FVC-Pre L 1.76  P  FVC-Predicted Pre % 69  P  FVC-Post L 1.79  P  FVC-Predicted Post % 70  P  Pre FEV1/FVC % % 71  P  Post FEV1/FCV % % 74  P  FEV1-Pre L 1.24  P  FEV1-Predicted Pre % 65  P  FEV1-Post L 1.32  P  DLCO uncorrected ml/min/mmHg 16.20  P  DLCO UNC% % 93  P  DLCO corrected ml/min/mmHg 16.20  P  DLCO COR %Predicted % 93  P  DLVA Predicted % 118  P  TLC L 4.32  P  TLC % Predicted % 93  P  RV % Predicted % 104  P    P Preliminary result    Labs:  Path:  Echo 08/29/20: LV EF 60-65%. Grade I diastolic dysfunction. RV systolic function is normal.    Heart Catheterization:  Assessment & Plan:   Moderate persistent asthma without complication  Abnormal pulmonary function test  Discussion: Rhyanna Sorce is a 76 year old woman, never smoker with history of asthma, DMII, and coronary artery disease who returns to pulmonary clinic for asthma   She has long standing history of asthma along with significant second hand smoke exposure. Her pulmonary function test shows non-specific pulmonary function pattern.   She is to continue advair 250-42mg 1 puff twice daily.   Follow up in 6 months.  JFreda Jackson MD [facility] Office: [phone]   Current Outpatient Medications:    albuterol (PROVENTIL) (2.5 MG/3ML) 0.083% nebulizer solution, Use 1 vial (2.5 mg total) by  nebulization every 6 (six) hours as needed for wheezing or shortness of breath., Disp: 90 mL, Rfl: 2   fluticasone (FLONASE) 50 MCG/ACT nasal spray, Place 1 spray into both nostrils daily as needed for allergies or rhinitis. , Disp: , Rfl:    Fluticasone-Salmeterol (ADVAIR)  250-50 MCG/DOSE AEPB, Inhale 1 puff into the lungs 2 (two) times daily., Disp: , Rfl:    loratadine (CLARITIN) 10 MG tablet, Take 1 tablet (10 mg total) by mouth daily., Disp: 30 tablet, Rfl: 0   aspirin 81 MG tablet, Take 81 mg by mouth daily., Disp: , Rfl:    atorvastatin (LIPITOR) 80 MG tablet, TAKE 1 TABLET(80 MG) BY MOUTH AT BEDTIME (Patient taking differently: Take 80 mg by mouth at bedtime.), Disp: 30 tablet, Rfl: 11   DULoxetine (CYMBALTA) 30 MG capsule, Take 30 mg by mouth daily., Disp: , Rfl:    ergocalciferol (VITAMIN D2) 50000 UNITS capsule, Take 50,000 Units by mouth once a week., Disp: , Rfl:    furosemide (LASIX) 40 MG tablet, Take 0.5 tablets (20 mg total) by mouth daily as needed for edema or fluid (Please take '20mg'$  Daily as needed for swelling, or weight gain.). (Patient taking differently: Take 20 mg by mouth daily as needed for edema or fluid.), Disp: 90 tablet, Rfl: 3   ipratropium-albuterol (DUONEB) 0.5-2.5 (3) MG/3ML SOLN, Use 1 vial by nebulization 2 (two) times daily. Use twice a day scheduled, Disp: 360 mL, Rfl: 0   JANUMET 50-500 MG tablet, Take 1 tablet by mouth daily., Disp: , Rfl:    LORazepam (ATIVAN) 1 MG tablet, Take 1 tablet (1 mg total) by mouth 2 (two) times daily., Disp: 3 tablet, Rfl: 0   MAGNESIUM-OXIDE 400 (240 Mg) MG tablet, Take 1 tablet by mouth daily as needed., Disp: , Rfl:    meclizine (ANTIVERT) 25 MG tablet, Take 25 mg by mouth daily as needed for dizziness., Disp: , Rfl:    metoprolol succinate (TOPROL-XL) 50 MG 24 hr tablet, TAKE 1 TABLET(50 MG) BY MOUTH DAILY WITH OR IMMEDIATELY FOLLOWING A MEAL (Patient taking differently: 50 mg daily.), Disp: 90 tablet, Rfl: 3   ONETOUCH DELICA  LANCETS 70V MISC, , Disp: , Rfl:    ONETOUCH VERIO test strip, , Disp: , Rfl:    oxyCODONE-acetaminophen (PERCOCET) 10-325 MG tablet, Take 1 tablet by mouth 2 (two) times daily as needed for pain., Disp: 3 tablet, Rfl: 0   pantoprazole (PROTONIX) 40 MG tablet, Take 40 mg by mouth 2 (two) times daily., Disp: , Rfl:    PROAIR HFA 108 (90 Base) MCG/ACT inhaler, Inhale 2 puffs into the lungs every 4 (four) hours as needed for wheezing or shortness of breath. Uses as rescue inhaler Reported on 06/20/2015, Disp: 6.7 g, Rfl: 0   promethazine (PHENERGAN) 12.5 MG tablet, Take 1 tablet (12.5 mg total) by mouth every 6 (six) hours as needed for nausea., Disp: 20 tablet, Rfl: 0   sacubitril-valsartan (ENTRESTO) 97-103 MG, Take 1 tablet by mouth 2 (two) times daily., Disp: 60 tablet, Rfl: 5   spironolactone (ALDACTONE) 25 MG tablet, Take 1 tablet (25 mg total) by mouth daily., Disp: 90 tablet, Rfl: 3   traZODone (DESYREL) 50 MG tablet, Take 75 mg by mouth at bedtime., Disp: , Rfl:

## 2021-10-05 NOTE — Progress Notes (Unsigned)
Cardiology Office Note   Date:  10/05/2021   ID:  [name], DOB Jul 05, 1945, MRN [id number]  PCP:  [name], MD  Cardiologist:   Aveyah Greenwood Martinique, MD   No chief complaint on file.   History of Present Illness: Nicole [name] is a 76 y.o. female who presents for follow up CHF. She has a PMH of coronary artery disease status post PCI, acute combined systolic and diastolic CHF, diabetes mellitus, depression, GERD, DOE, cardiomyopathy, and aphasia.    She was diagnosed with COVID-19 pneumonia 4/21.  She presented to the emergency department on 10/16/2019 with complaints of progressively worsening shortness of breath, DOE, and generalized weakness.  On arrival to the ED her high-sensitivity troponins were slightly elevated.  Her EKG showed sinus tachycardia with left atrial enlargement and possible lateral injury.  Her CXR showed cardiomegaly.  She was also noted to have some ST elevation in V3-V6 and LVH.  Her echocardiogram 8 /7 showed an EF of 20-25%,Global hypokinesis, G2 DD.  She underwent cardiac catheterization on 10/21/2019 which showed first marginal 95% stenosed with no other significant stenosis. OM was treated with DES.   She was managed medically with DAPT, metoprolol, diuresis, Entresto and Farxiga. Seen in office 8/31 with improvement.   She was admitted in early October 2021 with acute severe sepsis. Unknown source. Had a troponin leak felt to be due to demand ischemia. Repeat Echo showed EF 25-30%. Managed again medically. Entresto and Toprol doses increased.  Wilder Glade was held.  She was readmitted on November 1 with urosepsis. Treated with antibiotics. Had vertigo symptoms with negative cranial CT and MRI.   She was admitted from 6/19-6/22 with failure to thrive and AKI. and has done poorly since then with progressive weight loss. Her CHF meds were held due to hypotension and her creatinine normalized with hydration. Sent home with palliative care follow up and meds were  resumed at discharge.   Repeat Echo on 08/29/20 showed normalization of EF to 60-65%. Her heart medication was resumed.   She was admitted in early Jan 2023 with Covid 19 PNA and hypoxemia. Treated with steroids and remdesivir. She is followed by pulmonary. Still wears oxygen at night. Recent lung tests nonspecific without significant COPD.   She reports an episode of transient aphasia about a month ago. Has been followed by Neuro for vertigo. MRI and CT done in last couple of years.    Past Medical History:  Diagnosis Date   Anemia    Anxiety    Back pain    CAD (coronary artery disease) 10/22/2019   DES to [facility] August 2021   Colon polyps    COVID-28 June 2019   Endometriosis    Fibromyalgia    GERD with esophagitis    Follows with Dr. 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 mellitus ([place])    Type 2 diabetes mellitus ([place])    Urinary incontinence     Past Surgical History:  Procedure Laterality Date   ABDOMINAL HYSTERECTOMY     TAH BSO   APPENDECTOMY  1999   CATARACT EXTRACTION     both   CHOLECYSTECTOMY     CORONARY STENT INTERVENTION N/A 10/21/2019   Procedure: CORONARY STENT INTERVENTION;  Surgeon: Troy Sine, MD;  Location: [facility]  INVASIVE CV LAB;  Service: Cardiovascular;  Laterality: N/A;   DENTAL SURGERY     HERNIA REPAIR  2000   LAPAROSCOPIC ENDOMETRIOSIS FULGURATION     RIGHT/LEFT HEART CATH AND CORONARY ANGIOGRAPHY N/A 10/21/2019   Procedure: RIGHT/LEFT HEART CATH AND CORONARY ANGIOGRAPHY;  Surgeon: Troy Sine, MD;  Location: [place] CV LAB;  Service: Cardiovascular;  Laterality: N/A;     Current Outpatient Medications  Medication Sig Dispense Refill   albuterol (PROVENTIL) (2.5 MG/3ML) 0.083% nebulizer solution Use 1 vial (2.5 mg total) by nebulization every 6 (six) hours as needed for  wheezing or shortness of breath. 90 mL 2   aspirin 81 MG tablet Take 81 mg by mouth daily.     atorvastatin (LIPITOR) 80 MG tablet TAKE 1 TABLET(80 MG) BY MOUTH AT BEDTIME (Patient taking differently: Take 80 mg by mouth at bedtime.) 30 tablet 11   DULoxetine (CYMBALTA) 30 MG capsule Take 30 mg by mouth daily.     ergocalciferol (VITAMIN D2) 50000 UNITS capsule Take 50,000 Units by mouth once a week.     fluticasone (FLONASE) 50 MCG/ACT nasal spray Place 1 spray into both nostrils daily as needed for allergies or rhinitis.      Fluticasone-Salmeterol (ADVAIR) 250-50 MCG/DOSE AEPB Inhale 1 puff into the lungs 2 (two) times daily.     furosemide (LASIX) 40 MG tablet Take 0.5 tablets (20 mg total) by mouth daily as needed for edema or fluid (Please take '20mg'$  Daily as needed for swelling, or weight gain.). (Patient taking differently: Take 20 mg by mouth daily as needed for edema or fluid.) 90 tablet 3   ipratropium-albuterol (DUONEB) 0.5-2.5 (3) MG/3ML SOLN Use 1 vial by nebulization 2 (two) times daily. Use twice a day scheduled 360 mL 0   JANUMET 50-500 MG tablet Take 1 tablet by mouth daily.     loratadine (CLARITIN) 10 MG tablet Take 1 tablet (10 mg total) by mouth daily. 30 tablet 0   LORazepam (ATIVAN) 1 MG tablet Take 1 tablet (1 mg total) by mouth 2 (two) times daily. 3 tablet 0   MAGNESIUM-OXIDE 400 (240 Mg) MG tablet Take 1 tablet by mouth daily as needed.     meclizine (ANTIVERT) 25 MG tablet Take 25 mg by mouth daily as needed for dizziness.     metoprolol succinate (TOPROL-XL) 50 MG 24 hr tablet TAKE 1 TABLET(50 MG) BY MOUTH DAILY WITH OR IMMEDIATELY FOLLOWING A MEAL (Patient taking differently: 50 mg daily.) 90 tablet 3   ONETOUCH DELICA LANCETS 14G MISC      ONETOUCH VERIO test strip      oxyCODONE-acetaminophen (PERCOCET) 10-325 MG tablet Take 1 tablet by mouth 2 (two) times daily as needed for pain. 3 tablet 0   pantoprazole (PROTONIX) 40 MG tablet Take 40 mg by mouth 2 (two) times  daily.     PROAIR HFA 108 (90 Base) MCG/ACT inhaler Inhale 2 puffs into the lungs every 4 (four) hours as needed for wheezing or shortness of breath. Uses as rescue inhaler Reported on 06/20/2015 6.7 g 0   promethazine (PHENERGAN) 12.5 MG tablet Take 1 tablet (12.5 mg total) by mouth every 6 (six) hours as needed for nausea. 20 tablet 0   sacubitril-valsartan (ENTRESTO) 97-103 MG Take 1 tablet by mouth 2 (two) times daily. 60 tablet 5   spironolactone (ALDACTONE) 25 MG tablet Take 1 tablet (25 mg total) by mouth daily. 90 tablet 3   traZODone (DESYREL) 50 MG tablet Take 75 mg by  mouth at bedtime.     No current facility-administered medications for this visit.    Allergies:   Fish-derived products, Shellfish allergy, Aspirin, Benadryl [diphenhydramine], Ivp dye [iodinated contrast media], and Latex    Social History:  The patient  reports that she has never smoked. She has never used smokeless tobacco. She reports that she does not currently use alcohol. She reports that she does not use drugs.   Family History:  The patient's family history includes Cancer in her brother, brother, paternal aunt, and paternal uncle; Diabetes in her daughter, father, and mother; Heart attack in her father; Heart failure in her father and mother; Hypertension in her father, maternal grandfather, maternal grandmother, mother, paternal grandfather, and paternal grandmother; Stroke in her father; Thyroid disease in her brother.    ROS:  Please see the history of present illness.   Otherwise, review of systems are positive for none.   All other systems are reviewed and negative.    PHYSICAL EXAM: VS:  There were no vitals taken for this visit. , BMI There is no height or weight on file to calculate BMI. GEN: Well nourished, well developed, in no acute distress  HEENT: normal  Neck: no JVD, carotid bruits, or masses Cardiac: RRR; no murmurs, rubs, or gallops,no edema  Respiratory:  clear to auscultation  bilaterally, normal work of breathing GI: soft, nontender, nondistended, + BS MS: no deformity or atrophy  Skin: warm and dry, no rash Neuro:  Strength and sensation are intact Psych: euthymic mood, full affect   EKG:  EKG is not ordered today. The ekg ordered today demonstrates N/A   Recent Labs: 03/16/2021: ALT 20; B Natriuretic Peptide 67.4; Hemoglobin 10.8; Magnesium 1.5; Platelets 282 03/26/2021: TSH 1.19 08/01/2021: BUN 9; Creatinine, Ser 0.91; Potassium 3.9; Sodium 143    Lipid Panel    Component Value Date/Time   CHOL 68 12/16/2019 0237   TRIG 123 12/16/2019 0237   HDL 22 (L) 12/16/2019 0237   CHOLHDL 3.1 12/16/2019 0237   VLDL 25 12/16/2019 0237   LDLCALC 21 12/16/2019 0237      Wt Readings from Last 3 Encounters:  10/03/21 160 lb (72.6 kg)  10/03/21 160 lb (72.6 kg)  05/09/21 170 lb 6.4 oz (77.3 kg)    Labs dated 09/14/20: BUn 28, creatinine 1.5. cholesterol 124, triglycerides 144, HDL 33, LDL 57. A1c 7%. Otherwise CMET normal. Dated 01/30/21: cholesterol 125, triglycerides 183, HDL 42, LDL 46.  Dated 03/16/21: glucose 190. Creatinine 1.13. A1c 6.2%. otherwise CMET normal.  Other studies Reviewed: Additional studies/ records that were reviewed today include:   Cardiac cath/PCI 10/21/19:  CORONARY STENT INTERVENTION  RIGHT/LEFT HEART CATH AND CORONARY ANGIOGRAPHY  Conclusion    1st Mrg lesion is 95% stenosed. Post intervention, there is a 0% residual stenosis. Prox RCA to Mid RCA lesion is 20% stenosed. Prox LAD lesion is 20% stenosed. Mid LAD lesion is 40% stenosed. A stent was successfully placed.   Coronary obstructive disease with 20% proximal and 40% smooth mid LAD stenoses; 30% proximal circumflex stenosis with 95% near ostial stenosis in a bifurcating OM1 vessel; and 20% mid RCA narrowing.   Normal to minimally increased right heart pressures; mean PA pressure 21 mmHg   Successful PCI to 95% very proximal circumflex marginal stenosis with insertion of  a 2.25 x 8 mm DES stent with the stenosis being reduced to 0%.   RECOMMENDATION: Recommend DAPT for 12 months.  Guideline directed medical therapy for the patient's significant cardiomyopathy which  is out of proportion to her high-grade circumflex marginal stenosis.  Aggressive lipid-lowering therapy with target LDL less than 70.  Echo 12/13/19: IMPRESSIONS     1. EF similar to August diffuse hypokinesis worse in the septum , apex  and inferior walls Basal lateral wall only area with preserved function .  Left ventricular ejection fraction, by estimation, is 25 to 30%. The left  ventricle has severely decreased  function. The left ventricle demonstrates regional wall motion  abnormalities (see scoring diagram/findings for description). The left  ventricular internal cavity size was moderately dilated. Left ventricular  diastolic parameters were normal.   2. Right ventricular systolic function was not well visualized. The right  ventricular size is not well visualized. There is mildly elevated  pulmonary artery systolic pressure.   3. The mitral valve is normal in structure. Trivial mitral valve  regurgitation. No evidence of mitral stenosis.   4. Tricuspid valve regurgitation is moderate.   5. The aortic valve is normal in structure. Aortic valve regurgitation is  not visualized. Mild to moderate aortic valve sclerosis/calcification is  present, without any evidence of aortic stenosis.   6. The inferior vena cava is normal in size with greater than 50%  respiratory variability, suggesting right atrial pressure of 3 mmHg.   Echo 08/29/20: IMPRESSIONS     1. Left ventricular ejection fraction, by estimation, is 60 to 65%. The  left ventricle has normal function. The left ventricle has no regional  wall motion abnormalities. Left ventricular diastolic parameters are  consistent with Grade I diastolic  dysfunction (impaired relaxation). Elevated left atrial pressure.   2. Right  ventricular systolic function is normal. The right ventricular  size is normal. There is normal pulmonary artery systolic pressure.   3. The mitral valve is normal in structure. Trivial mitral valve  regurgitation. No evidence of mitral stenosis.   4. The aortic valve is tricuspid. Aortic valve regurgitation is not  visualized. Mild aortic valve sclerosis is present, with no evidence of  aortic valve stenosis.   5. The inferior vena cava is normal in size with greater than 50%  respiratory variability, suggesting right atrial pressure of 3 mmHg.   ASSESSMENT AND PLAN:  1. Chronic combined systolic/diastolic CHF-EF 62-22% initially. nonischemic.  Underwent cardiac catheterization 10/21/2019 which showed 95% OM lesion was treated with PCI/DES x1, 20% medial RCA, 20% proximal LAD had 40% medial LAD.  It was felt that her cardiomyopathy was out of proportion to her  OM stenosis.   On last Echo in June 2022  EF had normalized to 60-65%.  She is using lasix PRN Continue Entresto, Toprol XL, aldactone   2. Coronary artery disease-s/p DES of OM 1 in August. She is asymptomatic.  Continue aspirin  continue metoprolol, atorvastatin   3. Essential hypertension-BP is well controlled on current therapy    4. Type 2 diabetes-A1c 7.0  Followed by PCP   5. Recurrent urosepsis.  Avoid SGLT 2 inhibitors.   6. S/p Covid 9 infection/ history of asthma. Followed by pulmonary  7. Transient aphasia. ? TIA. Will check carotid dopplers.      Current medicines are reviewed at length with the patient today.  The patient does not have concerns regarding medicines.  The following changes have been made:  See above  Labs/ tests ordered today include:   No orders of the defined types were placed in this encounter.     Disposition:   FU with me or APP in 6 months  Signed, Altariq Goodall Martinique, MD  10/05/2021 1:37 PM    [facility] [address], [place], [place], [address] Phone  [phone], Fax [phone]

## 2021-10-08 ENCOUNTER — Encounter: Payer: Self-pay | Admitting: Neurology

## 2021-10-08 ENCOUNTER — Ambulatory Visit: Payer: PPO | Admitting: Neurology

## 2021-10-08 DIAGNOSIS — Z029 Encounter for administrative examinations, unspecified: Secondary | ICD-10-CM

## 2021-10-10 ENCOUNTER — Encounter: Payer: Self-pay | Admitting: Cardiology

## 2021-10-10 ENCOUNTER — Ambulatory Visit (INDEPENDENT_AMBULATORY_CARE_PROVIDER_SITE_OTHER): Payer: Medicare Other | Admitting: Cardiology

## 2021-10-10 VITALS — BP 110/70 | HR 78 | Ht 61.0 in | Wt 159.0 lb

## 2021-10-10 DIAGNOSIS — I2510 Atherosclerotic heart disease of native coronary artery without angina pectoris: Secondary | ICD-10-CM

## 2021-10-10 DIAGNOSIS — I10 Essential (primary) hypertension: Secondary | ICD-10-CM

## 2021-10-10 DIAGNOSIS — R479 Unspecified speech disturbances: Secondary | ICD-10-CM | POA: Diagnosis not present

## 2021-10-10 DIAGNOSIS — I5022 Chronic systolic (congestive) heart failure: Secondary | ICD-10-CM

## 2021-10-10 NOTE — Patient Instructions (Signed)
Medication Instructions:  Continue same medications *If you need a refill on your cardiac medications before your next appointment, please call your pharmacy*   Lab Work: None ordered   Testing/Procedures: Carotid dopplers   Follow-Up: At [facility], you and your health needs are our priority.  As part of our continuing mission to provide you with exceptional heart care, we have created designated Provider Care Teams.  These Care Teams include your primary Cardiologist (physician) and Advanced Practice Providers (APPs -  Physician Assistants and Nurse Practitioners) who all work together to provide you with the care you need, when you need it.  We recommend signing up for the patient portal called "MyChart".  Sign up information is provided on this After Visit Summary.  MyChart is used to connect with patients for Virtual Visits (Telemedicine).  Patients are able to view lab/test results, encounter notes, upcoming appointments, etc.  Non-urgent messages can be sent to your provider as well.   To learn more about what you can do with MyChart, go to [email or web address].      Your next appointment:  6 months   Call in Oct to schedule Feb appointment     The format for your next appointment: Office   Provider: [place]

## 2021-10-11 ENCOUNTER — Other Ambulatory Visit: Payer: Self-pay | Admitting: Cardiology

## 2021-10-11 DIAGNOSIS — R479 Unspecified speech disturbances: Secondary | ICD-10-CM

## 2021-10-15 ENCOUNTER — Ambulatory Visit (HOSPITAL_COMMUNITY)
Admission: RE | Admit: 2021-10-15 | Payer: Medicare Other | Source: Ambulatory Visit | Attending: Cardiology | Admitting: Cardiology

## 2021-10-17 ENCOUNTER — Telehealth: Payer: Self-pay | Admitting: Neurology

## 2021-10-17 NOTE — Telephone Encounter (Signed)
Patients daughter called and stated Nicole [name] had missed her last appt.  She has an appt with Melvyn Novas on 8/17.  She wanted to resch but the next appt is not until Jan,  can she come in sooner.  [phone]

## 2021-10-17 NOTE — Telephone Encounter (Signed)
Called patients daughter and left voicemail we can put patient on the cancellation list for the next appointment before January

## 2021-10-18 ENCOUNTER — Ambulatory Visit (HOSPITAL_COMMUNITY)
Admission: RE | Admit: 2021-10-18 | Payer: Medicare Other | Source: Ambulatory Visit | Attending: Cardiology | Admitting: Cardiology

## 2021-10-23 ENCOUNTER — Ambulatory Visit (HOSPITAL_COMMUNITY)
Admission: RE | Admit: 2021-10-23 | Payer: Medicare Other | Source: Ambulatory Visit | Attending: Cardiology | Admitting: Cardiology

## 2021-10-25 ENCOUNTER — Ambulatory Visit (INDEPENDENT_AMBULATORY_CARE_PROVIDER_SITE_OTHER): Payer: Medicare Other | Admitting: Psychology

## 2021-10-25 DIAGNOSIS — R413 Other amnesia: Secondary | ICD-10-CM

## 2021-10-25 NOTE — Progress Notes (Signed)
   Neuropsychology Note [place]. [place] Department of Neurology     Ms. Infantino's daughter called a couple hours before her scheduled appointment stating that her mother was feeling ill and could not attend her appointment. Ms. Panchal was rescheduled for 07/03/2022.

## 2021-11-01 ENCOUNTER — Encounter: Payer: Medicare Other | Admitting: Psychology

## 2021-11-07 ENCOUNTER — Ambulatory Visit (HOSPITAL_COMMUNITY)
Admission: RE | Admit: 2021-11-07 | Payer: Medicare Other | Source: Ambulatory Visit | Attending: Cardiology | Admitting: Cardiology

## 2021-11-15 ENCOUNTER — Other Ambulatory Visit: Payer: Self-pay | Admitting: Cardiology

## 2021-11-22 ENCOUNTER — Ambulatory Visit (HOSPITAL_COMMUNITY)
Admission: RE | Admit: 2021-11-22 | Discharge: 2021-11-22 | Disposition: A | Discharge status: Home / Self Care | Payer: Medicare Other | Source: Ambulatory Visit | Attending: Cardiology | Admitting: Cardiology

## 2021-11-22 DIAGNOSIS — R479 Unspecified speech disturbances: Secondary | ICD-10-CM | POA: Insufficient documentation

## 2021-11-22 DIAGNOSIS — R4789 Other speech disturbances: Secondary | ICD-10-CM

## 2022-01-16 ENCOUNTER — Other Ambulatory Visit: Payer: Self-pay | Admitting: Cardiology

## 2022-01-27 IMAGING — DX DG CHEST 1V PORT
1 series · 1 of 1 positions shown · non-contrast
Comparison: 03/11/2021

CLINICAL DATA: Shortness of breath, COVID positive

EXAM:
PORTABLE CHEST 1 VIEW

[chest ap]
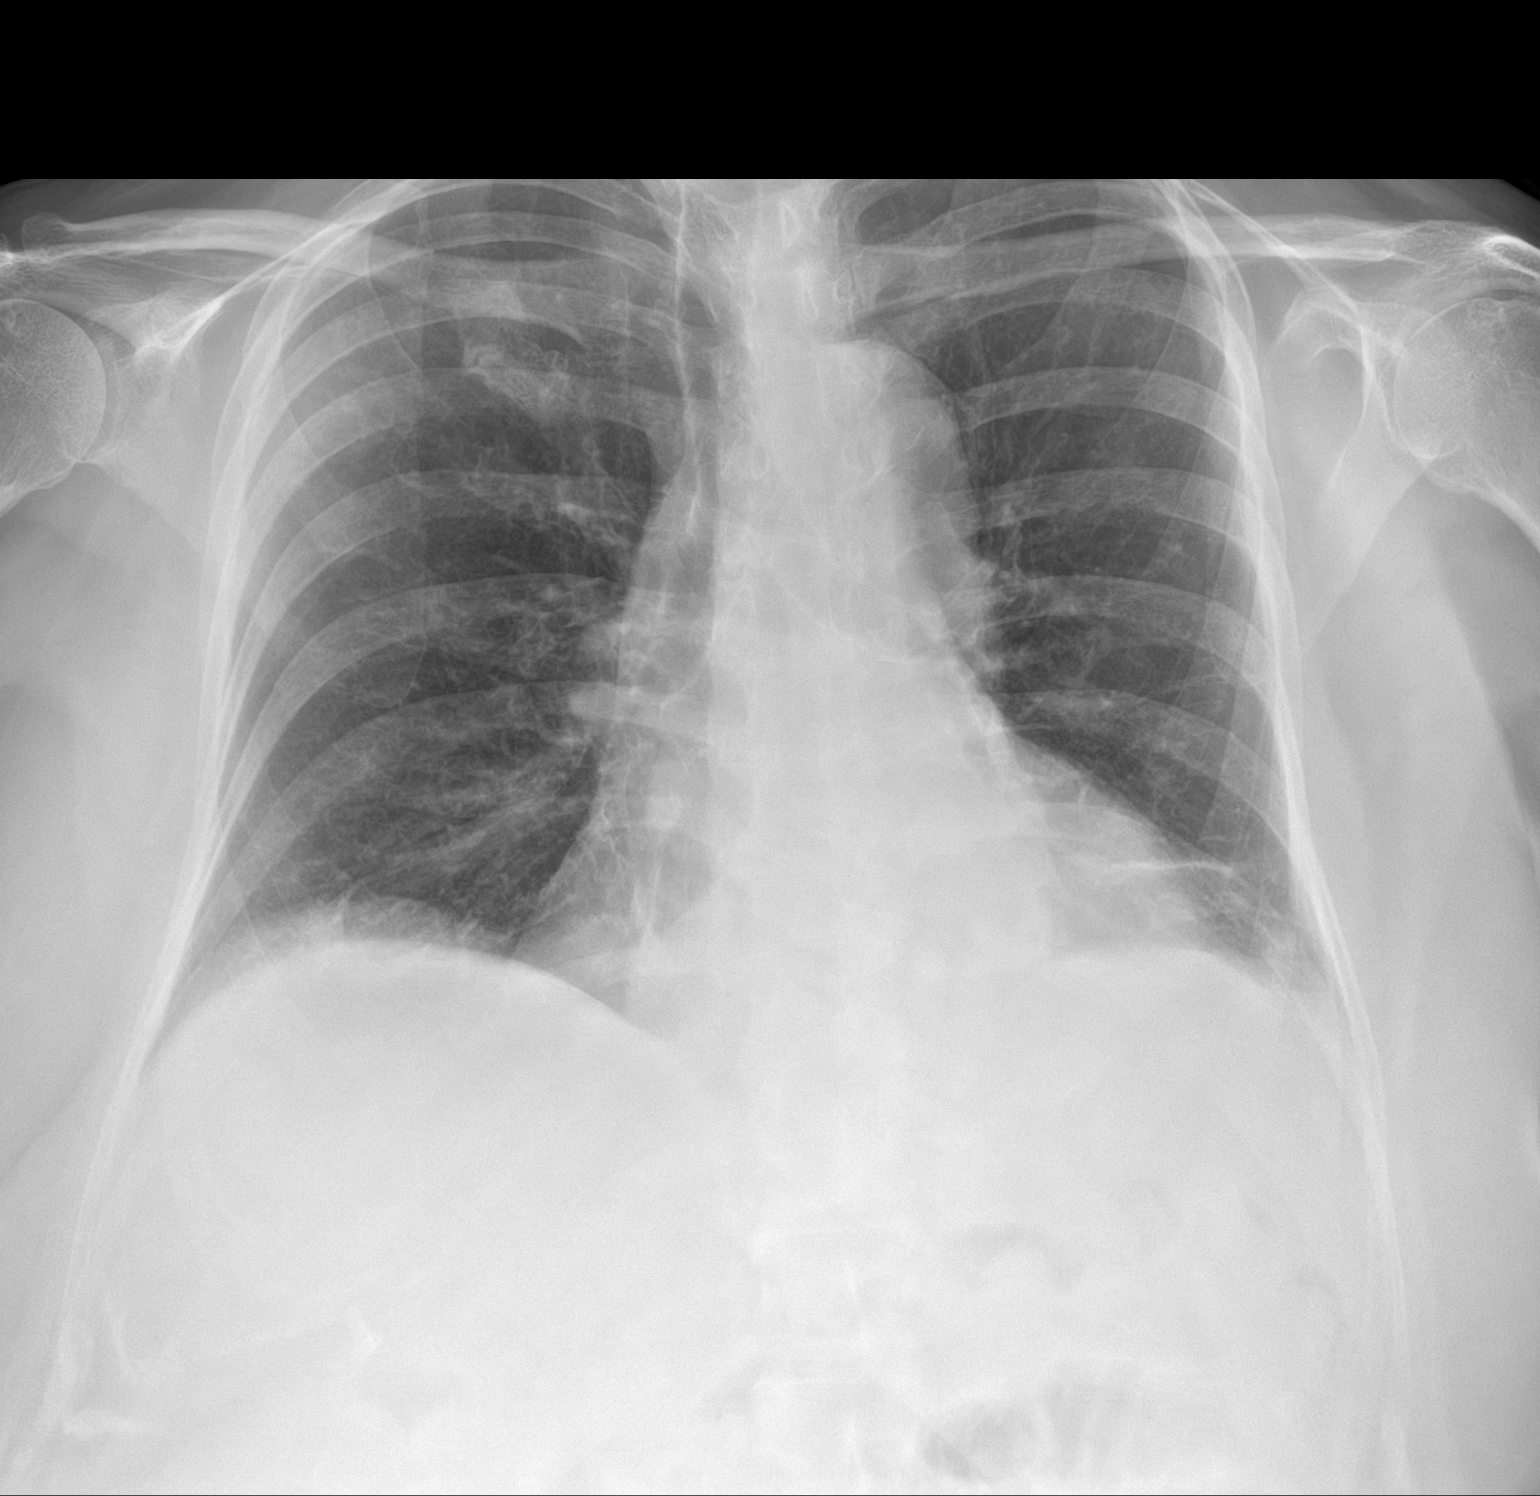

[1 of 1 positions shown; findings below may reference images not displayed]

FINDINGS: Transverse diameter of heart is increased. There are no signs of
pulmonary edema. Small transverse linear densities seen in the lower
lung fields, more so on the left side. There is no focal pulmonary
consolidation. There is minimal blunting of left lateral CP angle.
There is no pneumothorax. There is soft tissue fullness in the
retrocardiac region which may be due to fixed hiatal hernia or
tortuous and ectatic thoracic aorta.
IMPRESSION: There are small linear densities in the lower lung fields, more so
on the left side with interval worsening suggesting subsegmental
atelectasis. Possible minimal left pleural effusion.

## 2022-03-25 ENCOUNTER — Other Ambulatory Visit: Payer: Self-pay

## 2022-03-25 MED ORDER — FUROSEMIDE 40 MG PO TABS: 20.0000 mg | ORAL_TABLET | Freq: Every day | ORAL | 3 refills | Status: DC | PRN

## 2022-04-03 ENCOUNTER — Other Ambulatory Visit (HOSPITAL_BASED_OUTPATIENT_CLINIC_OR_DEPARTMENT_OTHER): Payer: Self-pay

## 2022-04-09 NOTE — Progress Notes (Signed)
Cardiology Office Note   Date:  04/15/2022   ID:  Nicole [name], DOB 1945/10/05, MRN [id number]  PCP:  [name], MD  Cardiologist:   Ferris Fielden Martinique, MD   Chief Complaint  Patient presents with   Congestive Heart Failure   Coronary Artery Disease    History of Present Illness: Nicole [name] is a 77 y.o. female who presents for follow up CHF. She has a PMH of coronary artery disease status post PCI, acute combined systolic and diastolic CHF, diabetes mellitus, depression, GERD, DOE, cardiomyopathy, and aphasia.    She was diagnosed with COVID-19 pneumonia 4/21.  She presented to the emergency department on 10/16/2019 with complaints of progressively worsening shortness of breath, DOE, and generalized weakness.  On arrival to the ED her high-sensitivity troponins were slightly elevated.  Her EKG showed sinus tachycardia with left atrial enlargement and possible lateral injury.  Her CXR showed cardiomegaly.  She was also noted to have some ST elevation in V3-V6 and LVH.  Her echocardiogram 8 /7 showed an EF of 20-25%,Global hypokinesis, G2 DD.  She underwent cardiac catheterization on 10/21/2019 which showed first marginal 95% stenosed with no other significant stenosis. OM was treated with DES.   She was managed medically with DAPT, metoprolol, diuresis, Entresto and Farxiga. Seen in office 8/31 with improvement.   She was admitted in early October 2021 with acute severe sepsis. Unknown source. Had a troponin leak felt to be due to demand ischemia. Repeat Echo showed EF 25-30%. Managed again medically. Entresto and Toprol doses increased.  Wilder Glade was held.  She was readmitted on November 1 with urosepsis. Treated with antibiotics. Had vertigo symptoms with negative cranial CT and MRI.   She was admitted from 6/19-6/22 with failure to thrive and AKI. and has done poorly since then with progressive weight loss. Her CHF meds were held due to hypotension and her creatinine normalized with  hydration. Sent home with palliative care follow up and meds were resumed at discharge.   Repeat Echo on 08/29/20 showed normalization of EF to 60-65%. Her heart medication was resumed.   She was admitted in early Jan 2023 with Covid 19 PNA and hypoxemia. Treated with steroids and remdesivir. She is followed by pulmonary. Still wears oxygen at night. Repeat lung tests nonspecific without significant COPD.   She reports some dizziness, poor memory, feeling sleepy. No chest pain. Breathing is OK. No edema. No palpitations. Has gained 5 lbs.    Past Medical History:  Diagnosis Date   Anemia    Anxiety    Back pain    CAD (coronary artery disease) 10/22/2019   DES to [facility] August 2021   Colon polyps    COVID-28 June 2019   Endometriosis    Fibromyalgia    GERD with esophagitis    Follows with Dr. 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 mellitus ([place])    Type 2 diabetes mellitus ([place])    Urinary incontinence     Past Surgical History:  Procedure Laterality Date   ABDOMINAL HYSTERECTOMY     TAH BSO   APPENDECTOMY  1999   CATARACT EXTRACTION     both   CHOLECYSTECTOMY     CORONARY STENT INTERVENTION N/A 10/21/2019   Procedure: CORONARY STENT INTERVENTION;  Surgeon:  Troy Sine, MD;  Location: [facility];  Service: Cardiovascular;  Laterality: N/A;   DENTAL SURGERY     HERNIA REPAIR  2000   LAPAROSCOPIC ENDOMETRIOSIS FULGURATION     RIGHT/LEFT HEART CATH AND CORONARY ANGIOGRAPHY N/A 10/21/2019   Procedure: RIGHT/LEFT HEART CATH AND CORONARY ANGIOGRAPHY;  Surgeon: Troy Sine, MD;  Location: [facility];  Service: Cardiovascular;  Laterality: N/A;     Current Outpatient Medications  Medication Sig Dispense Refill   aspirin 81 MG tablet Take 81 mg by mouth daily.     atorvastatin (LIPITOR) 80 MG  tablet TAKE 1 TABLET(80 MG) BY MOUTH AT BEDTIME (Patient taking differently: Take 80 mg by mouth at bedtime.) 30 tablet 11   DULoxetine (CYMBALTA) 30 MG capsule Take 30 mg by mouth daily.     ergocalciferol (VITAMIN D2) 50000 UNITS capsule Take 50,000 Units by mouth once a week.     fluticasone (FLONASE) 50 MCG/ACT nasal spray Place 1 spray into both nostrils daily as needed for allergies or rhinitis.      Fluticasone-Salmeterol (ADVAIR) 250-50 MCG/DOSE AEPB Inhale 1 puff into the lungs 2 (two) times daily.     furosemide (LASIX) 40 MG tablet Take 0.5 tablets (20 mg total) by mouth daily as needed for edema or fluid (Please take '20mg'$  Daily as needed for swelling, or weight gain.). 90 tablet 3   ipratropium-albuterol (DUONEB) 0.5-2.5 (3) MG/3ML SOLN Use 1 vial by nebulization 2 (two) times daily. Use twice a day scheduled 360 mL 0   JANUMET 50-500 MG tablet Take 1 tablet by mouth daily.     lidocaine (LIDODERM) 5 % 1 patch daily.     loratadine (CLARITIN) 10 MG tablet Take 1 tablet (10 mg total) by mouth daily. 30 tablet 0   LORazepam (ATIVAN) 1 MG tablet Take 1 tablet (1 mg total) by mouth 2 (two) times daily. 3 tablet 0   meclizine (ANTIVERT) 25 MG tablet Take 25 mg by mouth daily as needed for dizziness.     metoprolol succinate (TOPROL-XL) 50 MG 24 hr tablet Take 1 tablet (50 mg total) by mouth daily. Please schedule appointment for additional refills. 90 tablet 1   nitrofurantoin, macrocrystal-monohydrate, (MACROBID) 100 MG capsule 1 capsule with food Orally every 12 hrs for 7 days     ONETOUCH DELICA LANCETS 40N MISC      ONETOUCH VERIO test strip      oxyCODONE-acetaminophen (PERCOCET) 10-325 MG tablet Take 1 tablet by mouth 2 (two) times daily as needed for pain. 3 tablet 0   pantoprazole (PROTONIX) 40 MG tablet Take 40 mg by mouth 2 (two) times daily.     PROAIR HFA 108 (90 Base) MCG/ACT inhaler Inhale 2 puffs into the lungs every 4 (four) hours as needed for wheezing or shortness of  breath. Uses as rescue inhaler Reported on 06/20/2015 6.7 g 0   promethazine (PHENERGAN) 12.5 MG tablet Take 1 tablet (12.5 mg total) by mouth every 6 (six) hours as needed for nausea. 20 tablet 0   sacubitril-valsartan (ENTRESTO) 97-103 MG TAKE 1 TABLET BY MOUTH TWICE DAILY 60 tablet 10   spironolactone (ALDACTONE) 25 MG tablet Take 1 tablet (25 mg total) by mouth daily. 90 tablet 3   traZODone (DESYREL) 50 MG tablet Take 75 mg by mouth at bedtime.     albuterol (PROVENTIL) (2.5 MG/3ML) 0.083% nebulizer solution Use 1 vial (2.5 mg total) by nebulization every 6 (six) hours as needed for wheezing or shortness  of breath. 90 mL 2   No current facility-administered medications for this visit.    Allergies:   Fish-derived products, Shellfish allergy, Aspirin, Benadryl [diphenhydramine], Ivp dye [iodinated contrast media], and Latex    Social History:  The patient  reports that she has never smoked. She has never used smokeless tobacco. She reports that she does not currently use alcohol. She reports that she does not use drugs.   Family History:  The patient's family history includes Cancer in her brother, brother, paternal aunt, and paternal uncle; Diabetes in her daughter, father, and mother; Heart attack in her father; Heart failure in her father and mother; Hypertension in her father, maternal grandfather, maternal grandmother, mother, paternal grandfather, and paternal grandmother; Stroke in her father; Thyroid disease in her brother.    ROS:  Please see the history of present illness.   Otherwise, review of systems are positive for none.   All other systems are reviewed and negative.    PHYSICAL EXAM: VS:  BP 110/68   Pulse 67   Ht '5\' 1"'$  (1.549 m)   Wt 165 lb 6.4 oz (75 kg)   SpO2 91%   BMI 31.25 kg/m  , BMI Body mass index is 31.25 kg/m. GEN: Well nourished, well developed, in no acute distress  HEENT: normal  Neck: no JVD, carotid bruits, or masses Cardiac: RRR; no murmurs, rubs,  or gallops,no edema  Respiratory:  clear to auscultation bilaterally, normal work of breathing GI: soft, nontender, nondistended, + BS MS: no deformity or atrophy  Skin: warm and dry, no rash Neuro:  Strength and sensation are intact Psych: euthymic mood, full affect   EKG:  EKG is not ordered today. The ekg ordered today demonstrates N/A   Recent Labs: 08/01/2021: BUN 9; Creatinine, Ser 0.91; Potassium 3.9; Sodium 143    Lipid Panel    Component Value Date/Time   CHOL 68 12/16/2019 0237   TRIG 123 12/16/2019 0237   HDL 22 (L) 12/16/2019 0237   CHOLHDL 3.1 12/16/2019 0237   VLDL 25 12/16/2019 0237   LDLCALC 21 12/16/2019 0237      Wt Readings from Last 3 Encounters:  04/15/22 165 lb 6.4 oz (75 kg)  10/10/21 159 lb (72.1 kg)  10/03/21 160 lb (72.6 kg)    Labs dated 09/14/20: BUn 28, creatinine 1.5. cholesterol 124, triglycerides 144, HDL 33, LDL 57. A1c 7%. Otherwise CMET normal. Dated 01/30/21: cholesterol 125, triglycerides 183, HDL 42, LDL 46.  Dated 03/16/21: glucose 190. Creatinine 1.13. A1c 6.2%. otherwise CMET normal.  Other studies Reviewed: Additional studies/ records that were reviewed today include:   Cardiac cath/PCI 10/21/19:  CORONARY STENT INTERVENTION  RIGHT/LEFT HEART CATH AND CORONARY ANGIOGRAPHY  Conclusion    1st Mrg lesion is 95% stenosed. Post intervention, there is a 0% residual stenosis. Prox RCA to Mid RCA lesion is 20% stenosed. Prox LAD lesion is 20% stenosed. Mid LAD lesion is 40% stenosed. A stent was successfully placed.   Coronary obstructive disease with 20% proximal and 40% smooth mid LAD stenoses; 30% proximal circumflex stenosis with 95% near ostial stenosis in a bifurcating OM1 vessel; and 20% mid RCA narrowing.   Normal to minimally increased right heart pressures; mean PA pressure 21 mmHg   Successful PCI to 95% very proximal circumflex marginal stenosis with insertion of a 2.25 x 8 mm DES stent with the stenosis being reduced  to 0%.   RECOMMENDATION: Recommend DAPT for 12 months.  Guideline directed medical therapy for the patient's  significant cardiomyopathy which is out of proportion to her high-grade circumflex marginal stenosis.  Aggressive lipid-lowering therapy with target LDL less than 70.  Echo 12/13/19: IMPRESSIONS     1. EF similar to August diffuse hypokinesis worse in the septum , apex  and inferior walls Basal lateral wall only area with preserved function .  Left ventricular ejection fraction, by estimation, is 25 to 30%. The left  ventricle has severely decreased  function. The left ventricle demonstrates regional wall motion  abnormalities (see scoring diagram/findings for description). The left  ventricular internal cavity size was moderately dilated. Left ventricular  diastolic parameters were normal.   2. Right ventricular systolic function was not well visualized. The right  ventricular size is not well visualized. There is mildly elevated  pulmonary artery systolic pressure.   3. The mitral valve is normal in structure. Trivial mitral valve  regurgitation. No evidence of mitral stenosis.   4. Tricuspid valve regurgitation is moderate.   5. The aortic valve is normal in structure. Aortic valve regurgitation is  not visualized. Mild to moderate aortic valve sclerosis/calcification is  present, without any evidence of aortic stenosis.   6. The inferior vena cava is normal in size with greater than 50%  respiratory variability, suggesting right atrial pressure of 3 mmHg.   Echo 08/29/20: IMPRESSIONS     1. Left ventricular ejection fraction, by estimation, is 60 to 65%. The  left ventricle has normal function. The left ventricle has no regional  wall motion abnormalities. Left ventricular diastolic parameters are  consistent with Grade I diastolic  dysfunction (impaired relaxation). Elevated left atrial pressure.   2. Right ventricular systolic function is normal. The right ventricular   size is normal. There is normal pulmonary artery systolic pressure.   3. The mitral valve is normal in structure. Trivial mitral valve  regurgitation. No evidence of mitral stenosis.   4. The aortic valve is tricuspid. Aortic valve regurgitation is not  visualized. Mild aortic valve sclerosis is present, with no evidence of  aortic valve stenosis.   5. The inferior vena cava is normal in size with greater than 50%  respiratory variability, suggesting right atrial pressure of 3 mmHg.   ASSESSMENT AND PLAN:  1. Chronic combined systolic/diastolic CHF-EF 71-06% initially. nonischemic.  Underwent cardiac catheterization 10/21/2019 which showed 95% OM lesion was treated with PCI/DES x1, 20% medial RCA, 20% proximal LAD had 40% medial LAD.  It was felt that her cardiomyopathy was out of proportion to her  OM stenosis.   On last Echo in June 2022  EF had normalized to 60-65%.  She is using lasix PRN Continue Entresto, Toprol XL, aldactone   2. Coronary artery disease-s/p DES of OM 1 in August. She is asymptomatic.  Continue aspirin  continue metoprolol, atorvastatin   3. Essential hypertension-BP is well controlled on current therapy    4. Type 2 diabetes- Followed by PCP   5. Recurrent urosepsis.  Avoid SGLT 2 inhibitors.   6. S/p Covid 9 infection/ history of asthma. Followed by pulmonary  7. Transient aphasia. Normal carotid dopplers. Follow up with Neuro     Current medicines are reviewed at length with the patient today.  The patient does not have concerns regarding medicines.  The following changes have been made:  See above  Labs/ tests ordered today include:   No orders of the defined types were placed in this encounter.     Disposition:   FU with me or APP in one year  Signed, Tykisha Areola Martinique, MD  04/15/2022 2:08 PM    [facility] [address] [place], [place], [address] Phone [phone], Fax [phone]

## 2022-04-15 ENCOUNTER — Ambulatory Visit: Payer: Medicare Other | Attending: Cardiology | Admitting: Cardiology

## 2022-04-15 VITALS — BP 110/68 | HR 67 | Ht 61.0 in | Wt 165.4 lb

## 2022-04-15 DIAGNOSIS — I10 Essential (primary) hypertension: Secondary | ICD-10-CM

## 2022-04-15 DIAGNOSIS — I5022 Chronic systolic (congestive) heart failure: Secondary | ICD-10-CM

## 2022-04-15 DIAGNOSIS — I2510 Atherosclerotic heart disease of native coronary artery without angina pectoris: Secondary | ICD-10-CM

## 2022-04-15 NOTE — Patient Instructions (Signed)
Medication Instructions:   Your physician recommends that you continue on your current medications as directed. Please refer to the Current Medication list given to you today.  *If you need a refill on your cardiac medications before your next appointment, please call your pharmacy*  Lab Work: NONE ordered at this time of appointment   If you have labs (blood work) drawn today and your tests are completely normal, you will receive your results only by: MyChart Message (if you have MyChart) OR A paper copy in the mail If you have any lab test that is abnormal or we need to change your treatment, we will call you to review the results.  Testing/Procedures: NONE ordered at this time of appointment   Follow-Up: At [facility], you and your health needs are our priority.  As part of our continuing mission to provide you with exceptional heart care, we have created designated Provider Care Teams.  These Care Teams include your primary Cardiologist (physician) and Advanced Practice Providers (APPs -  Physician Assistants and Nurse Practitioners) who all work together to provide you with the care you need, when you need it.    Your next appointment:   1 year(s)  Provider:   Peter Jordan, MD     Other Instructions   

## 2022-05-21 ENCOUNTER — Other Ambulatory Visit: Payer: Self-pay

## 2022-05-21 MED ORDER — FUROSEMIDE 40 MG PO TABS: 20.0000 mg | ORAL_TABLET | Freq: Every day | ORAL | 3 refills | Status: DC | PRN

## 2022-05-21 MED ORDER — METOPROLOL SUCCINATE ER 50 MG PO TB24: 50.0000 mg | ORAL_TABLET | Freq: Every day | ORAL | 3 refills | Status: DC

## 2022-05-21 MED ORDER — SPIRONOLACTONE 25 MG PO TABS: 25.0000 mg | ORAL_TABLET | Freq: Every day | ORAL | 3 refills | Status: DC

## 2022-05-21 MED ORDER — ENTRESTO 97-103 MG PO TABS: 1.0000 | ORAL_TABLET | Freq: Two times a day (BID) | ORAL | 3 refills | Status: DC

## 2022-05-21 MED ORDER — ATORVASTATIN CALCIUM 80 MG PO TABS: 80.0000 mg | ORAL_TABLET | Freq: Every day | ORAL | 3 refills | Status: DC

## 2022-05-22 ENCOUNTER — Other Ambulatory Visit: Payer: Self-pay | Admitting: Cardiology

## 2022-05-23 NOTE — Telephone Encounter (Signed)
Called and left a voice message to have patient verify if the refills need to go to the local [facility] when it was sent to a mail order pharmacy on 05/21/22

## 2022-07-03 ENCOUNTER — Encounter: Payer: Self-pay | Admitting: Psychology

## 2022-07-03 ENCOUNTER — Encounter: Payer: Medicare Other | Admitting: Psychology

## 2022-07-03 DIAGNOSIS — Z029 Encounter for administrative examinations, unspecified: Secondary | ICD-10-CM

## 2022-07-10 ENCOUNTER — Encounter: Payer: Medicare Other | Admitting: Psychology

## 2022-11-25 ENCOUNTER — Ambulatory Visit
Admission: EM | Admit: 2022-11-25 | Discharge: 2022-11-25 | Disposition: A | Discharge status: Home / Self Care | Payer: Medicare Other | Attending: Family Medicine | Admitting: Family Medicine

## 2022-11-25 ENCOUNTER — Other Ambulatory Visit: Payer: Self-pay

## 2022-11-25 ENCOUNTER — Encounter: Payer: Self-pay | Admitting: Emergency Medicine

## 2022-11-25 ENCOUNTER — Ambulatory Visit: Payer: Medicare Other

## 2022-11-25 DIAGNOSIS — J4521 Mild intermittent asthma with (acute) exacerbation: Secondary | ICD-10-CM | POA: Diagnosis not present

## 2022-11-25 DIAGNOSIS — R0602 Shortness of breath: Secondary | ICD-10-CM | POA: Diagnosis not present

## 2022-11-25 DIAGNOSIS — U071 COVID-19: Secondary | ICD-10-CM

## 2022-11-25 MED ORDER — PREDNISONE 20 MG PO TABS: 40.0000 mg | ORAL_TABLET | Freq: Every day | ORAL | 0 refills | Status: AC

## 2022-11-25 MED ORDER — METHYLPREDNISOLONE ACETATE 80 MG/ML IJ SUSP
80.0000 mg | Freq: Once | INTRAMUSCULAR | Status: AC
  Administered 2022-11-25: 80 mg via INTRAMUSCULAR

## 2022-11-25 MED ORDER — IPRATROPIUM-ALBUTEROL 0.5-2.5 (3) MG/3ML IN SOLN
3.0000 mL | Freq: Once | RESPIRATORY_TRACT | Status: AC
  Administered 2022-11-25: 3 mL via RESPIRATORY_TRACT

## 2022-11-25 MED ORDER — ALBUTEROL SULFATE (5 MG/ML) 0.5% IN NEBU: 2.5000 mg | INHALATION_SOLUTION | Freq: Four times a day (QID) | RESPIRATORY_TRACT | 0 refills | Status: DC | PRN

## 2022-11-25 NOTE — ED Triage Notes (Signed)
Pt here for asthma sx with SOB and cough x 4 days getting more severe since being diagnosed with covid; pt sts she was hospitalized x 2 for covid in past; taking antivirals and inhaler

## 2022-11-25 NOTE — Discharge Instructions (Addendum)
You were seen today for asthma exacerbation due to covid.  Your chest xray appears normal today, but if the radiologist sees something on there I missed we will notify you.  In the mean time I have given you a nebulizer treatment.  I have sent out albuterol nebulizer medication, as well as an oral steroid.  Please start the oral steroid tomorrow as you were given a shot of a steroid today.  Please go to the ER if you have worsening cough, wheezing or sob.

## 2022-11-25 NOTE — ED Provider Notes (Signed)
[facility]    CSN: [id number] Arrival date & time: 11/25/22  1017      History   Chief Complaint Chief Complaint  Patient presents with   Asthma    HPI Nicole [name] is a 77 y.o. female.    Asthma Associated symptoms include shortness of breath.  Several days ago she tested positive for covid, and started molnupiravir for the last 4 days.  However, she conts to not feel well.  Weak, sore throat, etc.  She states 2 days ago asthma started flaring.  Was up all night with cough, sob.  She has been using her albuterol hfa a lot.  She has not been using her nebulizer.  Not sure if she has the medications for the nebulizer or not.   No new fevers/chills per se.  In the past she was hospitalized x 2 with asthma and pneumonia. She states she usually gets a shot of steroid when she has an asthma exacerbation.        Past Medical History:  Diagnosis Date   Anemia    Anxiety    Back pain    CAD (coronary artery disease) 10/22/2019   DES to OM1 August 2021   Colon polyps    COVID-28 June 2019   Endometriosis    Fibromyalgia    GERD with esophagitis    Follows with Dr. Russella Dar with GI, EGD in the past   IBS (irritable bowel syndrome)    Major depressive disorder    Migraine    Mild intermittent asthma    Mixed diabetic hyperlipidemia associated with type 2 diabetes mellitus (HCC)    Type 2 diabetes mellitus (HCC)    Urinary incontinence     Patient Active Problem List   Diagnosis Date Noted   COVID 03/11/2021   Abnormal gait 03/02/2021   Iron deficiency anemia 03/02/2021   Ischemic cardiomyopathy 03/02/2021   AKI (acute kidney injury) (HCC) 08/27/2020   Failure to thrive in adult 08/27/2020   Sepsis secondary to UTI (HCC) 01/11/2020   Sepsis (HCC) 01/10/2020   Non-ST elevation (NSTEMI) myocardial infarction [facility])    Acute respiratory failure with hypoxia (HCC)    Benzodiazepine withdrawal with complication (HCC)    Severe sepsis (HCC)  12/12/2019   Hyperammonemia (HCC) 12/12/2019   Chest pain in adult 12/12/2019   Nystagmus 12/11/2019   Leukocytosis 12/11/2019   Chronic combined systolic and diastolic congestive heart failure (HCC) 12/11/2019   Type 2 diabetes mellitus (HCC) 12/11/2019   Acute combined systolic and diastolic congestive heart failure (HCC) 10/22/2019   CAD S/P percutaneous coronary angioplasty 10/22/2019   Aphasia 10/21/2019   Elevated troponin    Cardiomyopathy (HCC)    DOE (dyspnea on exertion) 10/16/2019   Mild intermittent asthma with (acute) exacerbation 06/24/2019   COVID-19 virus infection 06/24/2019   Acute metabolic encephalopathy 06/24/2019   Hypokalemia, inadequate intake 06/24/2019   Hypomagnesemia 06/24/2019   GERD with esophagitis    Major depressive disorder    Mixed diabetic hyperlipidemia associated with type 2 diabetes mellitus (HCC)    Chronic daily headache 12/07/2014   Memory loss 08/31/2014   Worsening headaches 08/31/2014   Mixed bipolar I disorder (HCC) 05/24/2014   Atypical chest pain 11/05/2013   Anemia    Anxiety    Depression    Diabetes mellitus type 2 in obese [facility])    Urinary incontinence    Endometriosis    Asthma    Fibromyalgia    Vitamin  D deficiency 10/23/2009   Migraine 01/25/2009    Past Surgical History:  Procedure Laterality Date   ABDOMINAL HYSTERECTOMY     TAH BSO   APPENDECTOMY  1999   CATARACT EXTRACTION     both   CHOLECYSTECTOMY     CORONARY STENT INTERVENTION N/A 10/21/2019   Procedure: CORONARY STENT INTERVENTION;  Surgeon: Lennette Bihari, MD;  Location: [facility];  Service: Cardiovascular;  Laterality: N/A;   DENTAL SURGERY     HERNIA REPAIR  2000   LAPAROSCOPIC ENDOMETRIOSIS FULGURATION     RIGHT/LEFT HEART CATH AND CORONARY ANGIOGRAPHY N/A 10/21/2019   Procedure: RIGHT/LEFT HEART CATH AND CORONARY ANGIOGRAPHY;  Surgeon: Lennette Bihari, MD;  Location: [facility];  Service: Cardiovascular;  Laterality: N/A;    OB  History     Gravida  1   Para  1   Term      Preterm      AB      Living  1      SAB      IAB      Ectopic      Multiple      Live Births               Home Medications    Prior to Admission medications   Medication Sig Start Date End Date Taking? Authorizing Provider  albuterol (PROVENTIL) (2.5 MG/3ML) 0.083% nebulizer solution Use 1 vial (2.5 mg total) by nebulization every 6 (six) hours as needed for wheezing or shortness of breath. 03/16/21 03/16/22  Leroy Sea, MD  aspirin 81 MG tablet Take 81 mg by mouth daily.    [provider]  atorvastatin (LIPITOR) 80 MG tablet Take 1 tablet (80 mg total) by mouth at bedtime. 05/21/22   [place], Peter M, MD  DULoxetine (CYMBALTA) 30 MG capsule Take 30 mg by mouth daily. 08/10/19   [provider]  ergocalciferol (VITAMIN D2) 50000 UNITS capsule Take 50,000 Units by mouth once a week.    [provider]  fluticasone (FLONASE) 50 MCG/ACT nasal spray Place 1 spray into both nostrils daily as needed for allergies or rhinitis.  05/28/19   [provider]  Fluticasone-Salmeterol (ADVAIR) 250-50 MCG/DOSE AEPB Inhale 1 puff into the lungs 2 (two) times daily.    [provider]  furosemide (LASIX) 40 MG tablet Take 0.5 tablets (20 mg total) by mouth daily as needed for edema or fluid (Please take 20mg  Daily as needed for swelling, or weight gain.). 05/21/22   [place], Peter M, MD  ipratropium-albuterol (DUONEB) 0.5-2.5 (3) MG/3ML SOLN Use 1 vial by nebulization 2 (two) times daily. Use twice a day scheduled 03/16/21   Leroy Sea, MD  JANUMET 50-500 MG tablet Take 1 tablet by mouth daily. 07/27/20   [provider]  lidocaine (LIDODERM) 5 % 1 patch daily. 04/08/22   [provider]  loratadine (CLARITIN) 10 MG tablet Take 1 tablet (10 mg total) by mouth daily. 06/28/19   Ghimire, Werner Lean, MD  LORazepam (ATIVAN) 1 MG tablet Take 1 tablet (1 mg total) by mouth 2 (two) times  daily. 12/18/19   Marinda Elk, MD  meclizine (ANTIVERT) 25 MG tablet Take 25 mg by mouth daily as needed for dizziness.    [provider]  metoprolol succinate (TOPROL-XL) 50 MG 24 hr tablet Take 1 tablet (50 mg total) by mouth daily. 05/21/22   [place], Peter M, MD  nitrofurantoin, macrocrystal-monohydrate, (MACROBID) 100  MG capsule 1 capsule with food Orally every 12 hrs for 7 days 10/05/21   [provider]  Dola Argyle LANCETS 33G MISC  06/03/16   [provider]  [facility] VERIO test strip  06/03/16   [provider]  oxyCODONE-acetaminophen (PERCOCET) 10-325 MG tablet Take 1 tablet by mouth 2 (two) times daily as needed for pain. 12/18/19   Marinda Elk, MD  pantoprazole (PROTONIX) 40 MG tablet Take 40 mg by mouth 2 (two) times daily. 11/10/20   [provider]  PROAIR HFA 108 (90 Base) MCG/ACT inhaler Inhale 2 puffs into the lungs every 4 (four) hours as needed for wheezing or shortness of breath. Uses as rescue inhaler Reported on 06/20/2015 06/28/19   Maretta Bees, MD  promethazine (PHENERGAN) 12.5 MG tablet Take 1 tablet (12.5 mg total) by mouth every 6 (six) hours as needed for nausea. 01/15/20   Arrien, York Ram, MD  sacubitril-valsartan (ENTRESTO) 97-103 MG Take 1 tablet by mouth 2 (two) times daily. 05/21/22   [place], Peter M, MD  spironolactone (ALDACTONE) 25 MG tablet Take 1 tablet (25 mg total) by mouth daily. 05/21/22   [place], Peter M, MD  traZODone (DESYREL) 50 MG tablet Take 75 mg by mouth at bedtime. 12/15/20   [provider]    Family History Family History  Problem Relation Age of Onset   Hypertension Mother    Heart failure Mother    Diabetes Mother    Hypertension Father    Heart failure Father    Diabetes Father    Heart attack Father    Stroke Father    Cancer Brother        lung   Thyroid disease Brother    Cancer Brother        throat   Cancer Paternal Aunt        bone   Cancer  Paternal Uncle        lung   Hypertension Maternal Grandmother    Hypertension Maternal Grandfather    Hypertension Paternal Grandmother    Hypertension Paternal Grandfather    Diabetes Daughter    Colon cancer Neg Hx    Stomach cancer Neg Hx     Social History Social History   Tobacco Use   Smoking status: Never   Smokeless tobacco: Never  Vaping Use   Vaping status: Never Used  Substance Use Topics   Alcohol use: Not Currently    Comment: occasional   Drug use: No     Allergies   Fish-derived products, Shellfish allergy, Aspirin, Benadryl [diphenhydramine], Ivp dye [iodinated contrast media], and Latex   Review of Systems Review of Systems  Constitutional:  Positive for fatigue.  HENT:  Positive for congestion.   Respiratory:  Positive for cough, shortness of breath and wheezing.   Gastrointestinal:  Positive for nausea.  Musculoskeletal: Negative.   Skin: Negative.   Psychiatric/Behavioral: Negative.       Physical Exam Triage Vital Signs ED Triage Vitals  Encounter Vitals Group     BP 11/25/22 1033 118/72     Systolic BP Percentile --      Diastolic BP Percentile --      Pulse Rate 11/25/22 1033 82     Resp 11/25/22 1033 18     Temp 11/25/22 1033 98 F (36.7 C)     Temp Source 11/25/22 1033 Oral     SpO2 11/25/22 1033 94 %     Weight --  Height --      Head Circumference --      Peak Flow --      Pain Score 11/25/22 1034 4     Pain Loc --      Pain Education --      Exclude from Growth Chart --    No data found.  Updated Vital Signs BP 118/72 (BP Location: Right Arm)   Pulse 82   Temp 98 F (36.7 C) (Oral)   Resp 18   SpO2 94%   Visual Acuity Right Eye Distance:   Left Eye Distance:   Bilateral Distance:    Right Eye Near:   Left Eye Near:    Bilateral Near:     Physical Exam Constitutional:      General: She is not in acute distress.    Appearance: Normal appearance. She is not ill-appearing.  HENT:     Nose: Congestion  and rhinorrhea present.  Cardiovascular:     Rate and Rhythm: Normal rate and regular rhythm.  Pulmonary:     Effort: No respiratory distress.     Breath sounds: Wheezing present.     Comments: Scattered wheezes throughout the lung fields;   Musculoskeletal:     Cervical back: Normal range of motion and neck supple. No tenderness.  Neurological:     General: No focal deficit present.     Mental Status: She is alert.  Psychiatric:        Mood and Affect: Mood normal.      UC Treatments / Results  Labs (all labs ordered are listed, but only abnormal results are displayed) Labs Reviewed - No data to display  EKG   Radiology No results found.  Procedures Procedures (including critical care time)  Medications Ordered in UC Medications  methylPREDNISolone acetate (DEPO-MEDROL) injection 80 mg (has no administration in time range)  ipratropium-albuterol (DUONEB) 0.5-2.5 (3) MG/3ML nebulizer solution 3 mL (3 mLs Nebulization Given 11/25/22 1103)    Initial Impression / Assessment and Plan / UC Course  I have reviewed the triage vital signs and the nursing notes.  Pertinent labs & imaging results that were available during my care of the patient were reviewed by me and considered in my medical decision making (see chart for details).  Final Clinical Impressions(s) / UC Diagnoses   Final diagnoses:  COVID-19 virus infection  Mild intermittent asthma with acute exacerbation     Discharge Instructions      You were seen today for asthma exacerbation due to covid.  Your chest xray appears normal today, but if the radiologist sees something on there I missed we will notify you.  In the mean time I have given you a nebulizer treatment.  I have sent out albuterol nebulizer medication, as well as an oral steroid.  Please start the oral steroid tomorrow as you were given a shot of a steroid today.  Please go to the ER if you have worsening cough, wheezing or sob.     ED  Prescriptions     Medication Sig Dispense Auth. Provider   albuterol (PROVENTIL) (5 MG/ML) 0.5% nebulizer solution Take 0.5 mLs (2.5 mg total) by nebulization every 6 (six) hours as needed for wheezing or shortness of breath. 20 mL Haruka Kowaleski, MD   predniSONE (DELTASONE) 20 MG tablet Take 2 tablets (40 mg total) by mouth daily for 5 days. 10 tablet Jannifer Franklin, MD      PDMP not reviewed this encounter.   Jannifer Franklin, MD  11/25/22 1120  

## 2022-12-03 ENCOUNTER — Encounter: Payer: Self-pay | Admitting: Neurology

## 2022-12-03 ENCOUNTER — Ambulatory Visit: Payer: Medicare Other | Admitting: Neurology

## 2022-12-03 DIAGNOSIS — Z029 Encounter for administrative examinations, unspecified: Secondary | ICD-10-CM

## 2022-12-20 ENCOUNTER — Encounter: Payer: Self-pay | Admitting: Neurology

## 2022-12-21 ENCOUNTER — Other Ambulatory Visit: Payer: Self-pay

## 2022-12-21 ENCOUNTER — Emergency Department (HOSPITAL_COMMUNITY)
Admission: EM | Admit: 2022-12-21 | Discharge: 2022-12-21 | Disposition: A | Discharge status: Home / Self Care | Payer: Medicare Other | Attending: Emergency Medicine | Admitting: Emergency Medicine

## 2022-12-21 ENCOUNTER — Emergency Department (HOSPITAL_COMMUNITY): Payer: Medicare Other

## 2022-12-21 ENCOUNTER — Ambulatory Visit
Admission: EM | Admit: 2022-12-21 | Discharge: 2022-12-21 | Disposition: A | Discharge status: Other Acute Inpatient Hospital | Payer: Medicare Other

## 2022-12-21 DIAGNOSIS — J45901 Unspecified asthma with (acute) exacerbation: Secondary | ICD-10-CM | POA: Diagnosis not present

## 2022-12-21 DIAGNOSIS — R0602 Shortness of breath: Secondary | ICD-10-CM

## 2022-12-21 DIAGNOSIS — Z91040 Latex allergy status: Secondary | ICD-10-CM | POA: Diagnosis not present

## 2022-12-21 DIAGNOSIS — Z7982 Long term (current) use of aspirin: Secondary | ICD-10-CM | POA: Diagnosis not present

## 2022-12-21 LAB — CBC WITH DIFFERENTIAL/PLATELET
Abs Immature Granulocytes: 0.08 10*3/uL — ABNORMAL HIGH (ref 0.00–0.07)
Basophils Absolute: 0 10*3/uL (ref 0.0–0.1)
Basophils Relative: 0 %
Eosinophils Absolute: 0.2 10*3/uL (ref 0.0–0.5)
Eosinophils Relative: 2 %
HCT: 38.6 % (ref 36.0–46.0)
Hemoglobin: 12.5 g/dL (ref 12.0–15.0)
Immature Granulocytes: 1 %
Lymphocytes Relative: 15 %
Lymphs Abs: 1.6 10*3/uL (ref 0.7–4.0)
MCH: 30.2 pg (ref 26.0–34.0)
MCHC: 32.4 g/dL (ref 30.0–36.0)
MCV: 93.2 fL (ref 80.0–100.0)
Monocytes Absolute: 0.4 10*3/uL (ref 0.1–1.0)
Monocytes Relative: 4 %
Neutro Abs: 8.1 10*3/uL — ABNORMAL HIGH (ref 1.7–7.7)
Neutrophils Relative %: 78 %
Platelets: 255 10*3/uL (ref 150–400)
RBC: 4.14 MIL/uL (ref 3.87–5.11)
RDW: 13.5 % (ref 11.5–15.5)
WBC: 10.4 10*3/uL (ref 4.0–10.5)
nRBC: 0 % (ref 0.0–0.2)

## 2022-12-21 LAB — COMPREHENSIVE METABOLIC PANEL
ALT: 21 U/L (ref 0–44)
AST: 18 U/L (ref 15–41)
Albumin: 3.8 g/dL (ref 3.5–5.0)
Alkaline Phosphatase: 110 U/L (ref 38–126)
Anion gap: 9 (ref 5–15)
BUN: 26 mg/dL — ABNORMAL HIGH (ref 8–23)
CO2: 23 mmol/L (ref 22–32)
Calcium: 9.7 mg/dL (ref 8.9–10.3)
Chloride: 105 mmol/L (ref 98–111)
Creatinine, Ser: 1 mg/dL (ref 0.44–1.00)
GFR, Estimated: 59 mL/min — ABNORMAL LOW (ref >60)
Glucose, Bld: 147 mg/dL — ABNORMAL HIGH (ref 70–99)
Potassium: 4.4 mmol/L (ref 3.5–5.1)
Sodium: 137 mmol/L (ref 135–145)
Total Bilirubin: 0.7 mg/dL (ref 0.3–1.2)
Total Protein: 7.2 g/dL (ref 6.5–8.1)

## 2022-12-21 LAB — BRAIN NATRIURETIC PEPTIDE: B Natriuretic Peptide: 25 pg/mL (ref 0.0–100.0)

## 2022-12-21 LAB — TROPONIN I (HIGH SENSITIVITY)
Troponin I (High Sensitivity): 2 ng/L (ref <18)
Troponin I (High Sensitivity): 2 ng/L (ref <18)

## 2022-12-21 MED ORDER — MAGNESIUM SULFATE 2 GM/50ML IV SOLN
2.0000 g | Freq: Once | INTRAVENOUS | Status: AC
  Administered 2022-12-21: 2 g via INTRAVENOUS
  Filled 2022-12-21: qty 50

## 2022-12-21 MED ORDER — IPRATROPIUM-ALBUTEROL 0.5-2.5 (3) MG/3ML IN SOLN
3.0000 mL | Freq: Once | RESPIRATORY_TRACT | Status: AC
  Administered 2022-12-21: 3 mL via RESPIRATORY_TRACT
  Filled 2022-12-21: qty 3

## 2022-12-21 MED ORDER — PREDNISONE 20 MG PO TABS: 40.0000 mg | ORAL_TABLET | Freq: Every day | ORAL | 0 refills | Status: AC

## 2022-12-21 MED ORDER — IPRATROPIUM-ALBUTEROL 0.5-2.5 (3) MG/3ML IN SOLN
3.0000 mL | Freq: Once | RESPIRATORY_TRACT | Status: AC
  Administered 2022-12-21: 3 mL via RESPIRATORY_TRACT

## 2022-12-21 NOTE — ED Provider Notes (Signed)
[facility]    CSN: [id number] Arrival date & time: 12/21/22  1010      History   Chief Complaint Chief Complaint  Patient presents with   Asthma Exacerbation    HPI Nicole [name] is a 77 y.o. female.   Patient here today for ration of asthma exacerbation.  She reports that for the last 2 days she has had increased shortness of breath and wheezing.  She has taken at home medications without resolution.  She notes that typically she will need a nebulizer treatment, steroid injection and oral prednisone to cover exacerbation.  She does not report fever.  The history is provided by the patient.    Past Medical History:  Diagnosis Date   Anemia    Anxiety    Back pain    CAD (coronary artery disease) 10/22/2019   DES to OM1 August 2021   Colon polyps    COVID-28 June 2019   Endometriosis    Fibromyalgia    GERD with esophagitis    Follows with Dr. Russella Dar with GI, EGD in the past   IBS (irritable bowel syndrome)    Major depressive disorder    Migraine    Mild intermittent asthma    Mixed diabetic hyperlipidemia associated with type 2 diabetes mellitus (HCC)    Type 2 diabetes mellitus (HCC)    Urinary incontinence     Patient Active Problem List   Diagnosis Date Noted   COVID 03/11/2021   Abnormal gait 03/02/2021   Iron deficiency anemia 03/02/2021   Ischemic cardiomyopathy 03/02/2021   AKI (acute kidney injury) (HCC) 08/27/2020   Failure to thrive in adult 08/27/2020   Vertigo 02/01/2020   Sepsis secondary to UTI (HCC) 01/11/2020   Sepsis (HCC) 01/10/2020   Non-ST elevation (NSTEMI) myocardial infarction [facility])    Acute respiratory failure with hypoxia (HCC)    Benzodiazepine withdrawal with complication (HCC)    Severe sepsis (HCC) 12/12/2019   Hyperammonemia (HCC) 12/12/2019   Chest pain in adult 12/12/2019   Nystagmus 12/11/2019   Leukocytosis 12/11/2019   Chronic combined systolic and diastolic congestive heart failure (HCC)  12/11/2019   Type 2 diabetes mellitus (HCC) 12/11/2019   Acute combined systolic and diastolic congestive heart failure (HCC) 10/22/2019   CAD S/P percutaneous coronary angioplasty 10/22/2019   Aphasia 10/21/2019   Elevated troponin    Cardiomyopathy (HCC)    DOE (dyspnea on exertion) 10/16/2019   Mild intermittent asthma with (acute) exacerbation 06/24/2019   COVID-19 virus infection 06/24/2019   Acute metabolic encephalopathy 06/24/2019   Hypokalemia, inadequate intake 06/24/2019   Hypomagnesemia 06/24/2019   GERD with esophagitis    Major depressive disorder    Mixed diabetic hyperlipidemia associated with type 2 diabetes mellitus (HCC)    Chronic daily headache 12/07/2014   Memory loss 08/31/2014   Worsening headaches 08/31/2014   Mixed bipolar I disorder (HCC) 05/24/2014   Atypical chest pain 11/05/2013   Anemia    Anxiety    Depression    Type 2 diabetes mellitus with obesity (HCC)    Urinary incontinence    Endometriosis    Asthma    Fibromyalgia    Vitamin D deficiency 10/23/2009   Migraine 01/25/2009    Past Surgical History:  Procedure Laterality Date   ABDOMINAL HYSTERECTOMY     TAH BSO   APPENDECTOMY  1999   CATARACT EXTRACTION     both   CHOLECYSTECTOMY     CORONARY STENT INTERVENTION N/A  10/21/2019   Procedure: CORONARY STENT INTERVENTION;  Surgeon: Lennette Bihari, MD;  Location: [facility] INVASIVE CV LAB;  Service: Cardiovascular;  Laterality: N/A;   DENTAL SURGERY     HERNIA REPAIR  2000   LAPAROSCOPIC ENDOMETRIOSIS FULGURATION     RIGHT/LEFT HEART CATH AND CORONARY ANGIOGRAPHY N/A 10/21/2019   Procedure: RIGHT/LEFT HEART CATH AND CORONARY ANGIOGRAPHY;  Surgeon: Lennette Bihari, MD;  Location: [facility] INVASIVE CV LAB;  Service: Cardiovascular;  Laterality: N/A;    OB History     Gravida  1   Para  1   Term      Preterm      AB      Living  1      SAB      IAB      Ectopic      Multiple      Live Births               Home  Medications    Prior to Admission medications   Medication Sig Start Date End Date Taking? Authorizing Provider  albuterol (PROVENTIL) (5 MG/ML) 0.5% nebulizer solution Take 0.5 mLs (2.5 mg total) by nebulization every 6 (six) hours as needed for wheezing or shortness of breath. 11/25/22   Piontek, Denny Peon, MD  albuterol (VENTOLIN HFA) 108 (90 Base) MCG/ACT inhaler Inhale 2 puffs into the lungs every 4 (four) hours as needed for wheezing or shortness of breath. 06/28/19   [provider]  aspirin 81 MG tablet Take 81 mg by mouth daily.    [provider]  atorvastatin (LIPITOR) 80 MG tablet Take 1 tablet (80 mg total) by mouth at bedtime. 05/21/22   [place], Peter M, MD  atorvastatin (LIPITOR) 80 MG tablet Take 80 mg by mouth daily. 11/19/19   [provider]  DULoxetine (CYMBALTA) 30 MG capsule Take 30 mg by mouth daily. 08/10/19   [provider]  DULoxetine (CYMBALTA) 60 MG capsule Take 60 mg by mouth daily. 08/10/19   [provider]  ergocalciferol (VITAMIN D2) 50000 UNITS capsule Take 50,000 Units by mouth once a week.    [provider]  estradiol (ESTRACE) 0.1 MG/GM vaginal cream Place 1 Applicatorful vaginally as needed. 12/27/19   [provider]  fluticasone (FLONASE) 50 MCG/ACT nasal spray Place 1 spray into both nostrils daily as needed for allergies or rhinitis.  05/28/19   [provider]  fluticasone (FLONASE) 50 MCG/ACT nasal spray Place 1 spray into both nostrils daily. 05/28/19   [provider]  Fluticasone-Salmeterol (ADVAIR) 250-50 MCG/DOSE AEPB Inhale 1 puff into the lungs 2 (two) times daily.    [provider]  furosemide (LASIX) 40 MG tablet Take 0.5 tablets (20 mg total) by mouth daily as needed for edema or fluid (Please take 20mg  Daily as needed for swelling, or weight gain.). 05/21/22   [place], Peter M, MD  furosemide (LASIX) 40 MG tablet Take 40 mg by mouth daily. 12/17/19   [provider]  ipratropium-albuterol (DUONEB) 0.5-2.5 (3) MG/3ML SOLN Use 1 vial by nebulization 2 (two) times daily. Use twice a day scheduled 03/16/21   Leroy Sea, MD  JANUMET 50-500 MG tablet Take 1 tablet by mouth daily. 07/27/20   [provider]  LAGEVRIO 200 MG CAPS capsule Take 4 capsules by mouth 2 (two) times daily. 11/21/22   [provider]  lidocaine (LIDODERM) 5 % 1 patch daily. 04/08/22   [provider]  loratadine (CLARITIN) 10 MG  tablet Take 1 tablet (10 mg total) by mouth daily. 06/28/19   Ghimire, Werner Lean, MD  LORazepam (ATIVAN) 1 MG tablet Take 1 tablet (1 mg total) by mouth 2 (two) times daily. 12/18/19   Marinda Elk, MD  meclizine (ANTIVERT) 25 MG tablet Take 25 mg by mouth daily as needed for dizziness.    [provider]  metoprolol succinate (TOPROL-XL) 25 MG 24 hr tablet Take 1 tablet by mouth daily. 11/19/19   [provider]  metoprolol succinate (TOPROL-XL) 50 MG 24 hr tablet Take 1 tablet (50 mg total) by mouth daily. 05/21/22   [place], Peter M, MD  molnupiravir EUA (LAGEVRIO) 200 MG CAPS capsule Take 4 capsules by mouth 2 (two) times daily. 11/20/22   [provider]  nitrofurantoin, macrocrystal-monohydrate, (MACROBID) 100 MG capsule 1 capsule with food Orally every 12 hrs for 7 days 10/05/21   [provider]  omeprazole (PRILOSEC) 20 MG capsule Take 20 mg by mouth 2 (two) times daily before a meal. 12/27/19   [provider]  ondansetron (ZOFRAN) 4 MG tablet Take 1 tablet by mouth daily. 12/27/19   [provider]  Dola Argyle LANCETS 33G MISC  06/03/16   [provider]  [facility] VERIO test strip  06/03/16   [provider]  oxyCODONE-acetaminophen (PERCOCET) 10-325 MG tablet Take 1 tablet by mouth 2 (two) times daily as needed for pain. 12/18/19   Marinda Elk, MD  pantoprazole (PROTONIX) 40 MG tablet Take 40 mg by mouth 2 (two) times daily. 11/10/20   [provider]  Potassium Chloride ER 20 MEQ TBCR Take 1 tablet by mouth daily. 12/29/19   [provider]  PROAIR HFA 108 (90 Base) MCG/ACT inhaler Inhale 2 puffs into the lungs every 4 (four) hours as needed for wheezing or shortness of breath. Uses as rescue inhaler Reported on 06/20/2015 06/28/19   Maretta Bees, MD  promethazine (PHENERGAN) 12.5 MG tablet Take 1 tablet (12.5 mg total) by mouth every 6 (six) hours as needed for nausea. 01/15/20   Arrien, York Ram, MD  promethazine (PHENERGAN) 12.5 MG tablet Take 12.5 mg by mouth as needed for nausea or vomiting. 01/15/20   [provider]  sacubitril-valsartan (ENTRESTO) 24-26 MG Take 1 tablet by mouth 2 (two) times daily. 11/19/19   [provider]  sacubitril-valsartan (ENTRESTO) 97-103 MG Take 1 tablet by mouth 2 (two) times daily. 05/21/22   [place], Peter M, MD  spironolactone (ALDACTONE) 25 MG tablet Take 1 tablet (25 mg total) by mouth daily. 05/21/22   [place], Peter M, MD  ticagrelor (BRILINTA) 90 MG TABS tablet Take 90 mg by mouth 2 (two) times daily. 02/01/20   [provider]  traZODone (DESYREL) 50 MG tablet Take 75 mg by mouth at bedtime. 12/15/20   [provider]    Family History Family History  Problem Relation Age of Onset   Hypertension Mother    Heart failure Mother    Diabetes Mother    Hypertension Father    Heart failure Father    Diabetes Father    Heart attack Father    Stroke Father    Cancer Brother        lung   Thyroid disease Brother    Cancer Brother        throat   Cancer Paternal Aunt        bone   Cancer Paternal Uncle        lung  Hypertension Maternal Grandmother    Hypertension Maternal Grandfather    Hypertension Paternal Grandmother    Hypertension Paternal Grandfather    Diabetes Daughter    Colon cancer Neg Hx    Stomach cancer Neg Hx     Social History Social History   Tobacco Use   Smoking status: Never   Smokeless tobacco:  Never  Vaping Use   Vaping status: Never Used  Substance Use Topics   Alcohol use: Not Currently    Comment: occasional   Drug use: No     Allergies   Fish-derived products, Shellfish allergy, Aspirin, Benadryl [diphenhydramine], Ivp dye [iodinated contrast media], and Latex   Review of Systems Review of Systems  Constitutional:  Negative for chills and fever.  Eyes:  Negative for discharge and redness.  Respiratory:  Positive for shortness of breath and wheezing.   Gastrointestinal:  Negative for abdominal pain, nausea and vomiting.     Physical Exam Triage Vital Signs ED Triage Vitals  Encounter Vitals Group     BP 12/21/22 1015 114/67     Systolic BP Percentile --      Diastolic BP Percentile --      Pulse Rate 12/21/22 1015 81     Resp 12/21/22 1015 (!) 44     Temp 12/21/22 1015 97.6 F (36.4 C)     Temp Source 12/21/22 1015 Oral     SpO2 12/21/22 1015 95 %     Weight 12/21/22 1017 165 lb 5.5 oz (75 kg)     Height 12/21/22 1017 5\' 1"  (1.549 m)     Head Circumference --      Peak Flow --      Pain Score 12/21/22 1017 0     Pain Loc --      Pain Education --      Exclude from Growth Chart --    No data found.  Updated Vital Signs BP 114/67 (BP Location: Left Arm)   Pulse 81   Temp 97.6 F (36.4 C) (Oral)   Resp (!) 44   Ht 5\' 1"  (1.549 m)   Wt 165 lb 5.5 oz (75 kg)   SpO2 95%   BMI 31.24 kg/m   Physical Exam Vitals and nursing note reviewed.  Constitutional:      General: She is in acute distress.     Appearance: She is not ill-appearing.     Comments: Moderate respiratory distress noted  HENT:     Head: Normocephalic and atraumatic.  Eyes:     Conjunctiva/sclera: Conjunctivae normal.  Cardiovascular:     Rate and Rhythm: Normal rate.  Pulmonary:     Effort: Respiratory distress present.     Breath sounds: Wheezing present.     Comments: Diffuse wheezing noted, breath sounds diminished throughout, respiratory rate elevated, unable to  complete full sentences due to shortness of breath. Neurological:     Mental Status: She is alert.  Psychiatric:        Mood and Affect: Mood normal.        Behavior: Behavior normal.        Thought Content: Thought content normal.      UC Treatments / Results  Labs (all labs ordered are listed, but only abnormal results are displayed) Labs Reviewed - No data to display  EKG   Radiology No results found.  Procedures Procedures (including critical care time)  Medications Ordered in UC Medications  ipratropium-albuterol (DUONEB) 0.5-2.5 (3) MG/3ML nebulizer solution 3 mL (3  mLs Nebulization Given 12/21/22 1020)    Initial Impression / Assessment and Plan / UC Course  I have reviewed the triage vital signs and the nursing notes.  Pertinent labs & imaging results that were available during my care of the patient were reviewed by me and considered in my medical decision making (see chart for details).    DuoNeb administered in office.  Recommended further evaluation in the emergency room with transport via EMS given significantly elevated respiratory rate and occasional desaturation below 90%.  Patient is agreeable.  Final Clinical Impressions(s) / UC Diagnoses   Final diagnoses:  Asthma with acute exacerbation, unspecified asthma severity, unspecified whether persistent   Discharge Instructions   None    ED Prescriptions   None    PDMP not reviewed this encounter.   Tomi Bamberger, PA-C 12/21/22 1025

## 2022-12-21 NOTE — ED Notes (Signed)
Patient is being discharged from the Urgent Care and sent to the Emergency Department via Ambulance (911 called by provider: 1018 am) . Per R. Reggie Pile, patient is in need of higher level of care due to Respiratory distress. Patient is aware and verbalizes understanding of plan of care.  Vitals:   12/21/22 1015  BP: 114/67  Pulse: 81  Resp: (!) 44  Temp: 97.6 F (36.4 C)  SpO2: 95%

## 2022-12-21 NOTE — ED Triage Notes (Signed)
Pt from urgent care via EMS with SOB ongoing for past two days. No current chest pain. Home albuterol has been ineffective. Current O2 sats at 100 on 2L. Reports no pain.

## 2022-12-21 NOTE — ED Provider Notes (Signed)
[place] EMERGENCY DEPARTMENT AT [facility] Provider Note   CSN: [id number] Arrival date & time: 12/21/22  1102     History  No chief complaint on file.   Nicole [name] is a 77 y.o. female.  The history is provided by the patient, medical records and the EMS personnel. No language interpreter was used.  Shortness of Breath Severity:  Moderate Onset quality:  Gradual Duration:  3 days Timing:  Constant Progression:  Waxing and waning Chronicity:  Recurrent Context: not URI   Relieved by:  Nothing Worsened by:  Nothing Ineffective treatments:  None tried Associated symptoms: chest pain (resolved now) and wheezing   Associated symptoms: no abdominal pain, no cough, no fever, no headaches and no sputum production        Home Medications Prior to Admission medications   Medication Sig Start Date End Date Taking? Authorizing Provider  albuterol (PROVENTIL) (5 MG/ML) 0.5% nebulizer solution Take 0.5 mLs (2.5 mg total) by nebulization every 6 (six) hours as needed for wheezing or shortness of breath. 11/25/22   Piontek, Denny Peon, MD  albuterol (VENTOLIN HFA) 108 (90 Base) MCG/ACT inhaler Inhale 2 puffs into the lungs every 4 (four) hours as needed for wheezing or shortness of breath. 06/28/19   [provider]  aspirin 81 MG tablet Take 81 mg by mouth daily.    [provider]  atorvastatin (LIPITOR) 80 MG tablet Take 1 tablet (80 mg total) by mouth at bedtime. 05/21/22   [place], Peter M, MD  atorvastatin (LIPITOR) 80 MG tablet Take 80 mg by mouth daily. 11/19/19   [provider]  DULoxetine (CYMBALTA) 30 MG capsule Take 30 mg by mouth daily. 08/10/19   [provider]  DULoxetine (CYMBALTA) 60 MG capsule Take 60 mg by mouth daily. 08/10/19   [provider]  ergocalciferol (VITAMIN D2) 50000 UNITS capsule Take 50,000 Units by mouth once a week.    [provider]  estradiol (ESTRACE) 0.1 MG/GM vaginal cream Place 1  Applicatorful vaginally as needed. 12/27/19   [provider]  fluticasone (FLONASE) 50 MCG/ACT nasal spray Place 1 spray into both nostrils daily as needed for allergies or rhinitis.  05/28/19   [provider]  fluticasone (FLONASE) 50 MCG/ACT nasal spray Place 1 spray into both nostrils daily. 05/28/19   [provider]  Fluticasone-Salmeterol (ADVAIR) 250-50 MCG/DOSE AEPB Inhale 1 puff into the lungs 2 (two) times daily.    [provider]  furosemide (LASIX) 40 MG tablet Take 0.5 tablets (20 mg total) by mouth daily as needed for edema or fluid (Please take 20mg  Daily as needed for swelling, or weight gain.). 05/21/22   [place], Peter M, MD  furosemide (LASIX) 40 MG tablet Take 40 mg by mouth daily. 12/17/19   [provider]  ipratropium-albuterol (DUONEB) 0.5-2.5 (3) MG/3ML SOLN Use 1 vial by nebulization 2 (two) times daily. Use twice a day scheduled 03/16/21   Leroy Sea, MD  JANUMET 50-500 MG tablet Take 1 tablet by mouth daily. 07/27/20   [provider]  LAGEVRIO 200 MG CAPS capsule Take 4 capsules by mouth 2 (two) times daily. 11/21/22   [provider]  lidocaine (LIDODERM) 5 % 1 patch daily. 04/08/22   [provider]  loratadine (CLARITIN) 10 MG tablet Take 1 tablet (10 mg total) by mouth daily. 06/28/19   Ghimire, Werner Lean, MD  LORazepam (ATIVAN) 1 MG tablet Take 1 tablet (1 mg total) by mouth 2 (two)  times daily. 12/18/19   Marinda Elk, MD  meclizine (ANTIVERT) 25 MG tablet Take 25 mg by mouth daily as needed for dizziness.    [provider]  metoprolol succinate (TOPROL-XL) 25 MG 24 hr tablet Take 1 tablet by mouth daily. 11/19/19   [provider]  metoprolol succinate (TOPROL-XL) 50 MG 24 hr tablet Take 1 tablet (50 mg total) by mouth daily. 05/21/22   [place], Peter M, MD  molnupiravir EUA (LAGEVRIO) 200 MG CAPS capsule Take 4 capsules by mouth 2 (two) times daily. 11/20/22   [provider]  nitrofurantoin, macrocrystal-monohydrate, (MACROBID) 100 MG capsule 1 capsule with food Orally every 12 hrs for 7 days 10/05/21   [provider]  omeprazole (PRILOSEC) 20 MG capsule Take 20 mg by mouth 2 (two) times daily before a meal. 12/27/19   [provider]  ondansetron (ZOFRAN) 4 MG tablet Take 1 tablet by mouth daily. 12/27/19   [provider]  Dola Argyle LANCETS 33G MISC  06/03/16   [provider]  [facility] VERIO test strip  06/03/16   [provider]  oxyCODONE-acetaminophen (PERCOCET) 10-325 MG tablet Take 1 tablet by mouth 2 (two) times daily as needed for pain. 12/18/19   Marinda Elk, MD  pantoprazole (PROTONIX) 40 MG tablet Take 40 mg by mouth 2 (two) times daily. 11/10/20   [provider]  Potassium Chloride ER 20 MEQ TBCR Take 1 tablet by mouth daily. 12/29/19   [provider]  PROAIR HFA 108 (90 Base) MCG/ACT inhaler Inhale 2 puffs into the lungs every 4 (four) hours as needed for wheezing or shortness of breath. Uses as rescue inhaler Reported on 06/20/2015 06/28/19   Maretta Bees, MD  promethazine (PHENERGAN) 12.5 MG tablet Take 1 tablet (12.5 mg total) by mouth every 6 (six) hours as needed for nausea. 01/15/20   Arrien, York Ram, MD  promethazine (PHENERGAN) 12.5 MG tablet Take 12.5 mg by mouth as needed for nausea or vomiting. 01/15/20   [provider]  sacubitril-valsartan (ENTRESTO) 24-26 MG Take 1 tablet by mouth 2 (two) times daily. 11/19/19   [provider]  sacubitril-valsartan (ENTRESTO) 97-103 MG Take 1 tablet by mouth 2 (two) times daily. 05/21/22   [place], Peter M, MD  spironolactone (ALDACTONE) 25 MG tablet Take 1 tablet (25 mg total) by mouth daily. 05/21/22   [place], Peter M, MD  ticagrelor (BRILINTA) 90 MG TABS tablet Take 90 mg by mouth 2 (two) times daily. 02/01/20   [provider]  traZODone (DESYREL) 50 MG tablet Take 75 mg by mouth at  bedtime. 12/15/20   [provider]      Allergies    Fish-derived products, Shellfish allergy, Aspirin, Benadryl [diphenhydramine], Ivp dye [iodinated contrast media], and Latex    Review of Systems   Review of Systems  Constitutional:  Negative for chills, fatigue and fever.  HENT:  Negative for congestion.   Respiratory:  Positive for shortness of breath and wheezing. Negative for cough, sputum production and chest tightness.   Cardiovascular:  Positive for chest pain (resolved now). Negative for palpitations and leg swelling.  Gastrointestinal:  Negative for abdominal pain.  Genitourinary:  Negative for dysuria and flank pain.  Neurological:  Negative for weakness, light-headedness, numbness and headaches.  Psychiatric/Behavioral:  Negative for agitation and confusion.   All other systems reviewed and are negative.   Physical Exam Updated Vital Signs BP (!) 143/85 (BP Location: Right Arm)   Pulse 79  Temp 98.4 F (36.9 C) (Oral)   Resp 18   Ht 5\' 1"  (1.549 m)   Wt 75 kg   SpO2 100%   BMI 31.24 kg/m  Physical Exam Vitals and nursing note reviewed.  Constitutional:      General: She is not in acute distress.    Appearance: She is well-developed. She is not ill-appearing, toxic-appearing or diaphoretic.  HENT:     Head: Normocephalic and atraumatic.  Eyes:     Conjunctiva/sclera: Conjunctivae normal.  Cardiovascular:     Rate and Rhythm: Normal rate and regular rhythm.     Heart sounds: No murmur heard. Pulmonary:     Effort: Pulmonary effort is normal. Tachypnea present. No respiratory distress.     Breath sounds: Wheezing present. No rhonchi or rales.  Chest:     Chest wall: No tenderness.  Abdominal:     Palpations: Abdomen is soft.     Tenderness: There is no abdominal tenderness.  Musculoskeletal:        General: No swelling.     Cervical back: Neck supple.     Right lower leg: No tenderness. No edema.     Left lower leg: No tenderness. No edema.   Skin:    General: Skin is warm and dry.     Capillary Refill: Capillary refill takes less than 2 seconds.     Findings: No erythema.  Neurological:     General: No focal deficit present.     Mental Status: She is alert.  Psychiatric:        Mood and Affect: Mood normal.     ED Results / Procedures / Treatments   Labs (all labs ordered are listed, but only abnormal results are displayed) Labs Reviewed  CBC WITH DIFFERENTIAL/PLATELET - Abnormal; Notable for the following components:      Result Value   Neutro Abs 8.1 (*)    Abs Immature Granulocytes 0.08 (*)    All other components within normal limits  COMPREHENSIVE METABOLIC PANEL  BRAIN NATRIURETIC PEPTIDE  TROPONIN I (HIGH SENSITIVITY)    EKG EKG Interpretation Date/Time:  Saturday December 21 2022 11:35:40 EDT Ventricular Rate:  75 PR Interval:  165 QRS Duration:  95 QT Interval:  375 QTC Calculation: 419 R Axis:   -82  Text Interpretation: Sinus rhythm LAD, consider left anterior fascicular block Abnormal R-wave progression, late transition when compard to prior, slower rate. No STEMI Confirmed by Theda Belfast ([id number]) on 12/21/2022 11:38:23 AM  Radiology DG Chest Portable 1 View  Result Date: 12/21/2022 CLINICAL DATA:  Lymphoma 77 year old female with wheezing. Shortness of breath for 2 days. EXAM: PORTABLE CHEST 1 VIEW COMPARISON:  Chest radiographs 11/25/2022 and earlier. FINDINGS: Portable AP semi upright view at 1138 hours. Moderate chronic gastric hiatal hernia. Stable cardiac size and mediastinal contours. Visualized tracheal air column is within normal limits. Lung volumes are at the upper limits of normal. Allowing for portable technique the lungs are clear. No pneumothorax or pleural effusion. Paucity of bowel gas. No acute osseous abnormality identified. IMPRESSION: 1.  No acute cardiopulmonary abnormality. 2. Moderate chronic gastric hiatal hernia. Electronically Signed   By: Odessa Fleming M.D.   On: 12/21/2022  11:44    Procedures Procedures    Medications Ordered in ED Medications  magnesium sulfate IVPB 2 g 50 mL (0 g Intravenous Stopped 12/21/22 1144)  ipratropium-albuterol (DUONEB) 0.5-2.5 (3) MG/3ML nebulizer solution 3 mL (3 mLs Nebulization Given 12/21/22 1131)    ED Course/ Medical Decision Making/  A&P                                 Medical Decision Making Amount and/or Complexity of Data Reviewed Labs: ordered. Radiology: ordered.  Risk Prescription drug management.    GENISIS SONNIER is a 77 y.o. female with a past medical history significant for diabetes, anxiety, depression, GERD, CAD status post PCI, CHF, and asthma who presents from urgent care for shortness of breath and concern for asthma exacerbation.  Patient reports her last 3 days she has had shortness of breath and wheezing that has not been controlled at home.  She thought just an asthma exacerbation and does use home oxygen at home 2 L normally.  She denies any coughing or sick exposures.  She reports she had some chest tightness earlier that was slight discomfort but that has resolved.  Denies any nausea, vomiting, constipation but does have some diarrhea.  Denies urinary changes or dysuria.  Denies any new leg pain or leg swelling and denies history of blood clots.  Denies other complaints.  She went to urgent care and due to her significant wheezing was sent here for evaluation.  EMS gave a DuoNeb and Solu-Medrol with some improvement in her wheezing.  Patient reports he is feeling better and would like to go home soon however is agreeable to getting a workup.  On exam, patient still has wheezing in lung fields.  Lungs did not have rales or rhonchi.  Chest nontender.  Abdomen nontender.  Good pulses in extremities.  Legs nontender and nonedematous.  Patient in no distress and on 2 L now.  EKG does not show STEMI.  Due to her discomfort on and off patient is amenable to getting some labs including a BNP and  troponin.  Will get a chest x-ray to rule out pneumonia and she reports she had COVID about a month ago so will not retest.  Will give magnesium to go along with the medication she received as well as another DuoNeb.  If workup reassuring and she is feeling better, anticipate discharge home with burst of prednisone as she normally gets for outpatient follow-up.  Workup returned overall reassuring.  Troponin initially negative, she did not want to wait for second troponin result.  BNP normal.  CBC and CMP similar and improved from prior.  X-ray did not show pneumonia.  Patient feels much better after the breathing treatment and steroids.  She would like to go home.  Will give prescription for a burst of steroids and she will stay on her home oxygen.  She understands return precautions and follow-up instructions.  patient discharged in good condition with improved symptoms.          Final Clinical Impression(s) / ED Diagnoses Final diagnoses:  Exacerbation of asthma, unspecified asthma severity, unspecified whether persistent  Shortness of breath    Rx / DC Orders ED Discharge Orders          Ordered    predniSONE (DELTASONE) 20 MG tablet  Daily        12/21/22 1443            Clinical Impression: 1. Exacerbation of asthma, unspecified asthma severity, unspecified whether persistent   2. Shortness of breath     Disposition: Discharge  Condition: Good  I have discussed the results, Dx and Tx plan with the pt(& family if present). He/she/they expressed understanding and agree(s) with the  plan. Discharge instructions discussed at great length. Strict return precautions discussed and pt &/or family have verbalized understanding of the instructions. No further questions at time of discharge.    New Prescriptions   PREDNISONE (DELTASONE) 20 MG TABLET    Take 2 tablets (40 mg total) by mouth daily for 5 days.    Follow Up: Adrian Prince, MD [id number] [place]  [place] [address] [phone]     [facility] Emergency Department at [facility] [address] [place] [address] [phone]       Nancyann Cotterman, Canary Brim, MD 12/21/22 1444

## 2022-12-21 NOTE — ED Notes (Signed)
Magnesium sulfate held b/c pt stated it burned. Provider notified and confirmed.

## 2022-12-21 NOTE — ED Notes (Signed)
Pt's daughter Darrow Bussing notified (per pt request) that patient is being transported to the hospital for evaluation. She voices concern that patient has been having "a lot of diarrhea" and last time her potassium was very low

## 2022-12-21 NOTE — Discharge Instructions (Signed)
Your history, exam, workup today are consistent with asthma exacerbation as you suspected.  The x-ray did not show pneumonia and your labs are otherwise reassuring and similar to prior.  We feel you are safe for discharge home given your improvement in symptoms.  Please rest and stay hydrated and take the burst of steroids for the next 5 days starting tomorrow.  Please follow-up with your primary doctor.  If any symptoms change or worsen acutely, please return to the nearest emergency department.

## 2022-12-21 NOTE — ED Triage Notes (Signed)
"  I have had an asthma attack and wheezing for about 2 days". "Sob and wheezing is bad this morning". No fever. Some cough.

## 2023-04-03 NOTE — Progress Notes (Deleted)
 Cardiology Office Note   Date:  04/03/2023   ID:  Nicole [name], DOB 02/26/1946, MRN [id number]  PCP:  Adrian Prince, MD  Cardiologist:   Sheryl Towell [place], MD   No chief complaint on file.   History of Present Illness: Nicole [name] is a 78 y.o. female who presents for follow up CHF. She has a PMH of coronary artery disease status post PCI, acute combined systolic and diastolic CHF, diabetes mellitus, depression, GERD, DOE, cardiomyopathy, and aphasia.    She was diagnosed with COVID-19 pneumonia 4/21.  She presented to the emergency department on 10/16/2019 with complaints of progressively worsening shortness of breath, DOE, and generalized weakness.  On arrival to the ED her high-sensitivity troponins were slightly elevated.  Her EKG showed sinus tachycardia with left atrial enlargement and possible lateral injury.  Her CXR showed cardiomegaly.  She was also noted to have some ST elevation in V3-V6 and LVH.  Her echocardiogram 8 /7 showed an EF of 20-25%,Global hypokinesis, G2 DD.  She underwent cardiac catheterization on 10/21/2019 which showed first marginal 95% stenosed with no other significant stenosis. OM was treated with DES.   She was managed medically with DAPT, metoprolol, diuresis, Entresto and Farxiga. Seen in office 8/31 with improvement.   She was admitted in early October 2021 with acute severe sepsis. Unknown source. Had a troponin leak felt to be due to demand ischemia. Repeat Echo showed EF 25-30%. Managed again medically. Entresto and Toprol doses increased.  Marcelline Deist was held.  She was readmitted on November 1 with urosepsis. Treated with antibiotics. Had vertigo symptoms with negative cranial CT and MRI.   She was admitted from 6/19-6/22 with failure to thrive and AKI. and has done poorly since then with progressive weight loss. Her CHF meds were held due to hypotension and her creatinine normalized with hydration. Sent home with palliative care follow up and meds were  resumed at discharge.   Repeat Echo on 08/29/20 showed normalization of EF to 60-65%. Her heart medication was resumed.   She was admitted in early Jan 2023 with Covid 19 PNA and hypoxemia. Treated with steroids and remdesivir. She is followed by pulmonary. Still wears oxygen at night. Repeat lung tests nonspecific without significant COPD.   She reports some dizziness, poor memory, feeling sleepy. No chest pain. Breathing is OK. No edema. No palpitations. Has gained 5 lbs.    Past Medical History:  Diagnosis Date   Anemia    Anxiety    Back pain    CAD (coronary artery disease) 10/22/2019   DES to OM1 August 2021   Colon polyps    COVID-28 June 2019   Endometriosis    Fibromyalgia    GERD with esophagitis    Follows with Dr. Russella Dar with GI, EGD in the past   IBS (irritable bowel syndrome)    Major depressive disorder    Migraine    Mild intermittent asthma    Mixed diabetic hyperlipidemia associated with type 2 diabetes mellitus (HCC)    Type 2 diabetes mellitus (HCC)    Urinary incontinence     Past Surgical History:  Procedure Laterality Date   ABDOMINAL HYSTERECTOMY     TAH BSO   APPENDECTOMY  1999   CATARACT EXTRACTION     both   CHOLECYSTECTOMY     CORONARY STENT INTERVENTION N/A 10/21/2019   Procedure: CORONARY STENT INTERVENTION;  Surgeon: Lennette Bihari, MD;  Location: [facility];  Service:  Cardiovascular;  Laterality: N/A;   DENTAL SURGERY     HERNIA REPAIR  2000   LAPAROSCOPIC ENDOMETRIOSIS FULGURATION     RIGHT/LEFT HEART CATH AND CORONARY ANGIOGRAPHY N/A 10/21/2019   Procedure: RIGHT/LEFT HEART CATH AND CORONARY ANGIOGRAPHY;  Surgeon: Lennette Bihari, MD;  Location: [facility];  Service: Cardiovascular;  Laterality: N/A;     Current Outpatient Medications  Medication Sig Dispense Refill   albuterol (PROVENTIL) (5 MG/ML) 0.5% nebulizer solution Take 0.5 mLs (2.5 mg total) by nebulization every 6 (six) hours as needed for wheezing or  shortness of breath. 20 mL 0   albuterol (VENTOLIN HFA) 108 (90 Base) MCG/ACT inhaler Inhale 2 puffs into the lungs every 4 (four) hours as needed for wheezing or shortness of breath.     aspirin 81 MG tablet Take 81 mg by mouth daily.     atorvastatin (LIPITOR) 80 MG tablet Take 1 tablet (80 mg total) by mouth at bedtime. 90 tablet 3   atorvastatin (LIPITOR) 80 MG tablet Take 80 mg by mouth daily.     DULoxetine (CYMBALTA) 30 MG capsule Take 30 mg by mouth daily.     DULoxetine (CYMBALTA) 60 MG capsule Take 60 mg by mouth daily.     ergocalciferol (VITAMIN D2) 50000 UNITS capsule Take 50,000 Units by mouth once a week.     estradiol (ESTRACE) 0.1 MG/GM vaginal cream Place 1 Applicatorful vaginally as needed.     fluticasone (FLONASE) 50 MCG/ACT nasal spray Place 1 spray into both nostrils daily as needed for allergies or rhinitis.      fluticasone (FLONASE) 50 MCG/ACT nasal spray Place 1 spray into both nostrils daily.     Fluticasone-Salmeterol (ADVAIR) 250-50 MCG/DOSE AEPB Inhale 1 puff into the lungs 2 (two) times daily.     furosemide (LASIX) 40 MG tablet Take 0.5 tablets (20 mg total) by mouth daily as needed for edema or fluid (Please take 20mg  Daily as needed for swelling, or weight gain.). 45 tablet 3   furosemide (LASIX) 40 MG tablet Take 40 mg by mouth daily.     ipratropium-albuterol (DUONEB) 0.5-2.5 (3) MG/3ML SOLN Use 1 vial by nebulization 2 (two) times daily. Use twice a day scheduled 360 mL 0   JANUMET 50-500 MG tablet Take 1 tablet by mouth daily.     LAGEVRIO 200 MG CAPS capsule Take 4 capsules by mouth 2 (two) times daily.     lidocaine (LIDODERM) 5 % 1 patch daily.     loratadine (CLARITIN) 10 MG tablet Take 1 tablet (10 mg total) by mouth daily. 30 tablet 0   LORazepam (ATIVAN) 1 MG tablet Take 1 tablet (1 mg total) by mouth 2 (two) times daily. 3 tablet 0   meclizine (ANTIVERT) 25 MG tablet Take 25 mg by mouth daily as needed for dizziness.     metoprolol succinate  (TOPROL-XL) 25 MG 24 hr tablet Take 1 tablet by mouth daily.     metoprolol succinate (TOPROL-XL) 50 MG 24 hr tablet Take 1 tablet (50 mg total) by mouth daily. 90 tablet 3   molnupiravir EUA (LAGEVRIO) 200 MG CAPS capsule Take 4 capsules by mouth 2 (two) times daily.     nitrofurantoin, macrocrystal-monohydrate, (MACROBID) 100 MG capsule 1 capsule with food Orally every 12 hrs for 7 days     omeprazole (PRILOSEC) 20 MG capsule Take 20 mg by mouth 2 (two) times daily before a meal.     ondansetron (ZOFRAN) 4 MG tablet Take 1 tablet  by mouth daily.     ONETOUCH DELICA LANCETS 33G MISC      ONETOUCH VERIO test strip      oxyCODONE-acetaminophen (PERCOCET) 10-325 MG tablet Take 1 tablet by mouth 2 (two) times daily as needed for pain. 3 tablet 0   pantoprazole (PROTONIX) 40 MG tablet Take 40 mg by mouth 2 (two) times daily.     Potassium Chloride ER 20 MEQ TBCR Take 1 tablet by mouth daily.     PROAIR HFA 108 (90 Base) MCG/ACT inhaler Inhale 2 puffs into the lungs every 4 (four) hours as needed for wheezing or shortness of breath. Uses as rescue inhaler Reported on 06/20/2015 6.7 g 0   promethazine (PHENERGAN) 12.5 MG tablet Take 1 tablet (12.5 mg total) by mouth every 6 (six) hours as needed for nausea. 20 tablet 0   promethazine (PHENERGAN) 12.5 MG tablet Take 12.5 mg by mouth as needed for nausea or vomiting.     sacubitril-valsartan (ENTRESTO) 24-26 MG Take 1 tablet by mouth 2 (two) times daily.     sacubitril-valsartan (ENTRESTO) 97-103 MG Take 1 tablet by mouth 2 (two) times daily. 180 tablet 3   spironolactone (ALDACTONE) 25 MG tablet Take 1 tablet (25 mg total) by mouth daily. 90 tablet 3   ticagrelor (BRILINTA) 90 MG TABS tablet Take 90 mg by mouth 2 (two) times daily.     traZODone (DESYREL) 50 MG tablet Take 75 mg by mouth at bedtime.     No current facility-administered medications for this visit.    Allergies:   Fish-derived products, Shellfish allergy, Aspirin, Benadryl  [diphenhydramine], Ivp dye [iodinated contrast media], and Latex    Social History:  The patient  reports that she has never smoked. She has never used smokeless tobacco. She reports that she does not currently use alcohol. She reports that she does not use drugs.   Family History:  The patient's family history includes Cancer in her brother, brother, paternal aunt, and paternal uncle; Diabetes in her daughter, father, and mother; Heart attack in her father; Heart failure in her father and mother; Hypertension in her father, maternal grandfather, maternal grandmother, mother, paternal grandfather, and paternal grandmother; Stroke in her father; Thyroid disease in her brother.    ROS:  Please see the history of present illness.   Otherwise, review of systems are positive for none.   All other systems are reviewed and negative.    PHYSICAL EXAM: VS:  There were no vitals taken for this visit. , BMI There is no height or weight on file to calculate BMI. GEN: Well nourished, well developed, in no acute distress  HEENT: normal  Neck: no JVD, carotid bruits, or masses Cardiac: RRR; no murmurs, rubs, or gallops,no edema  Respiratory:  clear to auscultation bilaterally, normal work of breathing GI: soft, nontender, nondistended, + BS MS: no deformity or atrophy  Skin: warm and dry, no rash Neuro:  Strength and sensation are intact Psych: euthymic mood, full affect   EKG:  EKG is not ordered today. The ekg ordered today demonstrates N/A   Recent Labs: 12/21/2022: ALT 21; B Natriuretic Peptide 25.0; BUN 26; Creatinine, Ser 1.00; Hemoglobin 12.5; Platelets 255; Potassium 4.4; Sodium 137    Lipid Panel    Component Value Date/Time   CHOL 68 12/16/2019 0237   TRIG 123 12/16/2019 0237   HDL 22 (L) 12/16/2019 0237   CHOLHDL 3.1 12/16/2019 0237   VLDL 25 12/16/2019 0237   LDLCALC 21 12/16/2019 0237  Wt Readings from Last 3 Encounters:  12/21/22 165 lb 5.5 oz (75 kg)  12/21/22 165  lb 5.5 oz (75 kg)  04/15/22 165 lb 6.4 oz (75 kg)    Labs dated 09/14/20: BUn 28, creatinine 1.5. cholesterol 124, triglycerides 144, HDL 33, LDL 57. A1c 7%. Otherwise CMET normal. Dated 01/30/21: cholesterol 125, triglycerides 183, HDL 42, LDL 46.  Dated 03/16/21: glucose 190. Creatinine 1.13. A1c 6.2%. otherwise CMET normal.  Other studies Reviewed: Additional studies/ records that were reviewed today include:   Cardiac cath/PCI 10/21/19:  CORONARY STENT INTERVENTION  RIGHT/LEFT HEART CATH AND CORONARY ANGIOGRAPHY  Conclusion    1st Mrg lesion is 95% stenosed. Post intervention, there is a 0% residual stenosis. Prox RCA to Mid RCA lesion is 20% stenosed. Prox LAD lesion is 20% stenosed. Mid LAD lesion is 40% stenosed. A stent was successfully placed.   Coronary obstructive disease with 20% proximal and 40% smooth mid LAD stenoses; 30% proximal circumflex stenosis with 95% near ostial stenosis in a bifurcating OM1 vessel; and 20% mid RCA narrowing.   Normal to minimally increased right heart pressures; mean PA pressure 21 mmHg   Successful PCI to 95% very proximal circumflex marginal stenosis with insertion of a 2.25 x 8 mm DES stent with the stenosis being reduced to 0%.   RECOMMENDATION: Recommend DAPT for 12 months.  Guideline directed medical therapy for the patient's significant cardiomyopathy which is out of proportion to her high-grade circumflex marginal stenosis.  Aggressive lipid-lowering therapy with target LDL less than 70.  Echo 12/13/19: IMPRESSIONS     1. EF similar to August diffuse hypokinesis worse in the septum , apex  and inferior walls Basal lateral wall only area with preserved function .  Left ventricular ejection fraction, by estimation, is 25 to 30%. The left  ventricle has severely decreased  function. The left ventricle demonstrates regional wall motion  abnormalities (see scoring diagram/findings for description). The left  ventricular internal cavity  size was moderately dilated. Left ventricular  diastolic parameters were normal.   2. Right ventricular systolic function was not well visualized. The right  ventricular size is not well visualized. There is mildly elevated  pulmonary artery systolic pressure.   3. The mitral valve is normal in structure. Trivial mitral valve  regurgitation. No evidence of mitral stenosis.   4. Tricuspid valve regurgitation is moderate.   5. The aortic valve is normal in structure. Aortic valve regurgitation is  not visualized. Mild to moderate aortic valve sclerosis/calcification is  present, without any evidence of aortic stenosis.   6. The inferior vena cava is normal in size with greater than 50%  respiratory variability, suggesting right atrial pressure of 3 mmHg.   Echo 08/29/20: IMPRESSIONS     1. Left ventricular ejection fraction, by estimation, is 60 to 65%. The  left ventricle has normal function. The left ventricle has no regional  wall motion abnormalities. Left ventricular diastolic parameters are  consistent with Grade I diastolic  dysfunction (impaired relaxation). Elevated left atrial pressure.   2. Right ventricular systolic function is normal. The right ventricular  size is normal. There is normal pulmonary artery systolic pressure.   3. The mitral valve is normal in structure. Trivial mitral valve  regurgitation. No evidence of mitral stenosis.   4. The aortic valve is tricuspid. Aortic valve regurgitation is not  visualized. Mild aortic valve sclerosis is present, with no evidence of  aortic valve stenosis.   5. The inferior vena cava is normal  in size with greater than 50%  respiratory variability, suggesting right atrial pressure of 3 mmHg.   ASSESSMENT AND PLAN:  1. Chronic combined systolic/diastolic CHF-EF 20-25% initially. nonischemic.  Underwent cardiac catheterization 10/21/2019 which showed 95% OM lesion was treated with PCI/DES x1, 20% medial RCA, 20% proximal LAD had  40% medial LAD.  It was felt that her cardiomyopathy was out of proportion to her  OM stenosis.   On last Echo in June 2022  EF had normalized to 60-65%.  She is using lasix PRN Continue Entresto, Toprol XL, aldactone   2. Coronary artery disease-s/p DES of OM 1 in August. She is asymptomatic.  Continue aspirin  continue metoprolol, atorvastatin   3. Essential hypertension-BP is well controlled on current therapy    4. Type 2 diabetes- Followed by PCP   5. Recurrent urosepsis.  Avoid SGLT 2 inhibitors.   6. S/p Covid 9 infection/ history of asthma. Followed by pulmonary  7. Transient aphasia. Normal carotid dopplers. Follow up with Neuro     Current medicines are reviewed at length with the patient today.  The patient does not have concerns regarding medicines.  The following changes have been made:  See above  Labs/ tests ordered today include:   No orders of the defined types were placed in this encounter.     Disposition:   FU with me or APP in one year  Signed, Kysen Wetherington [place], MD  04/03/2023 2:46 PM    [facility] [address] [place], [place], [address] Phone [phone], Fax [phone]

## 2023-04-10 ENCOUNTER — Ambulatory Visit: Payer: Medicare Other | Attending: Cardiology | Admitting: Cardiology

## 2023-04-11 ENCOUNTER — Other Ambulatory Visit: Payer: Self-pay | Admitting: Cardiology

## 2023-04-14 ENCOUNTER — Other Ambulatory Visit: Payer: Self-pay

## 2023-04-14 MED ORDER — ATORVASTATIN CALCIUM 80 MG PO TABS: 80.0000 mg | ORAL_TABLET | Freq: Every day | ORAL | 3 refills | Status: DC

## 2023-04-14 MED ORDER — SPIRONOLACTONE 25 MG PO TABS: 25.0000 mg | ORAL_TABLET | Freq: Every day | ORAL | 3 refills | Status: DC

## 2023-04-14 MED ORDER — ENTRESTO 97-103 MG PO TABS: 1.0000 | ORAL_TABLET | Freq: Two times a day (BID) | ORAL | 10 refills | Status: DC

## 2023-04-14 MED ORDER — METOPROLOL SUCCINATE ER 25 MG PO TB24: 25.0000 mg | ORAL_TABLET | Freq: Every day | ORAL | 3 refills | Status: DC

## 2023-04-23 ENCOUNTER — Other Ambulatory Visit: Payer: Self-pay | Admitting: Cardiology

## 2023-05-12 ENCOUNTER — Other Ambulatory Visit: Payer: Self-pay | Admitting: Cardiology

## 2023-05-29 ENCOUNTER — Encounter: Payer: Medicare Other | Admitting: Obstetrics and Gynecology

## 2023-06-10 ENCOUNTER — Other Ambulatory Visit: Payer: Self-pay | Admitting: Cardiology

## 2023-07-09 ENCOUNTER — Other Ambulatory Visit: Payer: Self-pay | Admitting: Cardiology

## 2023-07-11 MED ORDER — METOPROLOL SUCCINATE ER 25 MG PO TB24: 25.0000 mg | ORAL_TABLET | Freq: Every day | ORAL | 0 refills | Status: DC

## 2023-07-11 MED ORDER — SPIRONOLACTONE 25 MG PO TABS: 25.0000 mg | ORAL_TABLET | Freq: Every day | ORAL | 0 refills | Status: DC

## 2023-07-11 MED ORDER — ATORVASTATIN CALCIUM 80 MG PO TABS: 80.0000 mg | ORAL_TABLET | Freq: Every day | ORAL | 0 refills | Status: DC

## 2023-07-11 MED ORDER — METOPROLOL SUCCINATE ER 50 MG PO TB24: 50.0000 mg | ORAL_TABLET | Freq: Every day | ORAL | 0 refills | Status: DC

## 2023-07-11 MED ORDER — ENTRESTO 24-26 MG PO TABS
1.0000 | ORAL_TABLET | Freq: Two times a day (BID) | ORAL | 0 refills | Status: DC
Start: 2023-07-11 — End: 2023-12-21

## 2023-07-13 ENCOUNTER — Other Ambulatory Visit: Payer: Self-pay | Admitting: Cardiology

## 2023-07-16 ENCOUNTER — Other Ambulatory Visit: Payer: Self-pay | Admitting: Cardiology

## 2023-07-21 ENCOUNTER — Ambulatory Visit: Attending: Cardiology | Admitting: Cardiology

## 2023-07-22 ENCOUNTER — Encounter: Payer: Self-pay | Admitting: Cardiology

## 2023-08-08 ENCOUNTER — Other Ambulatory Visit: Payer: Self-pay | Admitting: Cardiology

## 2023-09-05 NOTE — Progress Notes (Unsigned)
 Cardiology Office Note   Date:  09/11/2023   ID:  Nicole [name], DOB 1945/11/16, MRN [id number]  PCP:  Nichole Senior, MD  Cardiologist:   Leeland Lovelady [place], MD   Chief Complaint  Patient presents with   Congestive Heart Failure    History of Present Illness: Nicole [name] is a 78 y.o. female who presents for follow up CHF. She has a PMH of coronary artery disease status post PCI, acute combined systolic and diastolic CHF, diabetes mellitus, depression, GERD, DOE, cardiomyopathy, and aphasia.    She was diagnosed with COVID-19 pneumonia 4/21.  She presented to the emergency department on 10/16/2019 with complaints of progressively worsening shortness of breath, DOE, and generalized weakness.  On arrival to the ED her high-sensitivity troponins were slightly elevated.  Her EKG showed sinus tachycardia with left atrial enlargement and possible lateral injury.  Her CXR showed cardiomegaly.  She was also noted to have some ST elevation in V3-V6 and LVH.  Her echocardiogram 8 /7 showed an EF of 20-25%,Global hypokinesis, G2 DD.  She underwent cardiac catheterization on 10/21/2019 which showed first marginal 95% stenosed with no other significant stenosis. OM was treated with DES.   She was managed medically with DAPT, metoprolol , diuresis, Entresto  and Farxiga . Seen in office 8/31 with improvement.   She was admitted in early October 2021 with acute severe sepsis. Unknown source. Had a troponin leak felt to be due to demand ischemia. Repeat Echo showed EF 25-30%. Managed again medically. Entresto  and Toprol  doses increased.  Farxiga  was held.  She was readmitted on November 1 with urosepsis. Treated with antibiotics. Had vertigo symptoms with negative cranial CT and MRI.   She was admitted from 6/19-6/22 with failure to thrive and AKI. and has done poorly since then with progressive weight loss. Her CHF meds were held due to hypotension and her creatinine normalized with hydration. Sent home with  palliative care follow up and meds were resumed at discharge.   Repeat Echo on 08/29/20 showed normalization of EF to 60-65%. Her heart medication was resumed.   She was admitted in early Jan 2023 with Covid 19 PNA and hypoxemia. Treated with steroids and remdesivir . She is followed by pulmonary. Still wears oxygen  at night. Repeat lung tests nonspecific without significant COPD.   On follow up she is mainly having issues with asthma. On steroids. Wheezing. Feels like she is using rescue inhaler a lot. No chest pain. No swelling. Weight is stable.    Past Medical History:  Diagnosis Date   Anemia    Anxiety    Back pain    CAD (coronary artery disease) 10/22/2019   DES to OM1 August 2021   Colon polyps    COVID-28 June 2019   Endometriosis    Fibromyalgia    GERD with esophagitis    Follows with Dr. Aneita with GI, EGD in the past   IBS (irritable bowel syndrome)    Major depressive disorder    Migraine    Mild intermittent asthma    Mixed diabetic hyperlipidemia associated with type 2 diabetes mellitus (HCC)    Type 2 diabetes mellitus (HCC)    Urinary incontinence     Past Surgical History:  Procedure Laterality Date   ABDOMINAL HYSTERECTOMY     TAH BSO   APPENDECTOMY  1999   CATARACT EXTRACTION     both   CHOLECYSTECTOMY     CORONARY STENT INTERVENTION N/A 10/21/2019   Procedure: CORONARY STENT  INTERVENTION;  Surgeon: Burnard Debby LABOR, MD;  Location: [facility] INVASIVE CV LAB;  Service: Cardiovascular;  Laterality: N/A;   DENTAL SURGERY     HERNIA REPAIR  2000   LAPAROSCOPIC ENDOMETRIOSIS FULGURATION     RIGHT/LEFT HEART CATH AND CORONARY ANGIOGRAPHY N/A 10/21/2019   Procedure: RIGHT/LEFT HEART CATH AND CORONARY ANGIOGRAPHY;  Surgeon: Burnard Debby LABOR, MD;  Location: [facility] INVASIVE CV LAB;  Service: Cardiovascular;  Laterality: N/A;     Current Outpatient Medications  Medication Sig Dispense Refill   furosemide  (LASIX ) 20 MG tablet Take 1 tablet (20 mg total) by mouth  daily. 90 tablet 3   albuterol  (PROVENTIL ) (5 MG/ML) 0.5% nebulizer solution Take 0.5 mLs (2.5 mg total) by nebulization every 6 (six) hours as needed for wheezing or shortness of breath. 20 mL 0   albuterol  (VENTOLIN  HFA) 108 (90 Base) MCG/ACT inhaler Inhale 2 puffs into the lungs every 4 (four) hours as needed for wheezing or shortness of breath.     aspirin  81 MG tablet Take 81 mg by mouth daily.     atorvastatin  (LIPITOR ) 80 MG tablet TAKE 1 TABLET BY MOUTH AT BEDTIME 90 tablet 3   DULoxetine  (CYMBALTA ) 30 MG capsule Take 30 mg by mouth daily.     DULoxetine  (CYMBALTA ) 60 MG capsule Take 60 mg by mouth daily.     ergocalciferol  (VITAMIN D2) 50000 UNITS capsule Take 50,000 Units by mouth once a week.     estradiol (ESTRACE) 0.1 MG/GM vaginal cream Place 1 Applicatorful vaginally as needed.     fluticasone  (FLONASE ) 50 MCG/ACT nasal spray Place 1 spray into both nostrils daily as needed for allergies or rhinitis.      Fluticasone -Salmeterol (ADVAIR) 250-50 MCG/DOSE AEPB Inhale 1 puff into the lungs 2 (two) times daily.     ipratropium-albuterol  (DUONEB) 0.5-2.5 (3) MG/3ML SOLN Use 1 vial by nebulization 2 (two) times daily. Use twice a day scheduled 360 mL 0   JANUMET  50-500 MG tablet Take 1 tablet by mouth daily.     lidocaine  (LIDODERM ) 5 % 1 patch daily.     loratadine  (CLARITIN ) 10 MG tablet Take 1 tablet (10 mg total) by mouth daily. 30 tablet 0   LORazepam  (ATIVAN ) 1 MG tablet Take 1 tablet (1 mg total) by mouth 2 (two) times daily. 3 tablet 0   meclizine  (ANTIVERT ) 25 MG tablet Take 25 mg by mouth daily as needed for dizziness.     metoprolol  succinate (TOPROL -XL) 25 MG 24 hr tablet Take 1 tablet (25 mg total) by mouth daily. 15 tablet 0   omeprazole (PRILOSEC) 20 MG capsule Take 20 mg by mouth 2 (two) times daily before a meal.     ondansetron  (ZOFRAN ) 4 MG tablet Take 1 tablet by mouth daily.     ONETOUCH DELICA LANCETS 33G MISC      ONETOUCH VERIO test strip       oxyCODONE -acetaminophen  (PERCOCET) 10-325 MG tablet Take 1 tablet by mouth 2 (two) times daily as needed for pain. 3 tablet 0   pantoprazole  (PROTONIX ) 40 MG tablet Take 40 mg by mouth 2 (two) times daily.     Potassium Chloride  ER 20 MEQ TBCR Take 1 tablet by mouth daily.     PROAIR  HFA 108 (90 Base) MCG/ACT inhaler Inhale 2 puffs into the lungs every 4 (four) hours as needed for wheezing or shortness of breath. Uses as rescue inhaler Reported on 06/20/2015 6.7 g 0   sacubitril -valsartan  (ENTRESTO ) 24-26 MG Take 1 tablet by mouth  2 (two) times daily. 30 tablet 0   sacubitril -valsartan  (ENTRESTO ) 97-103 MG Take 1 tablet by mouth 2 (two) times daily. 60 tablet 10   spironolactone  (ALDACTONE ) 25 MG tablet Take 1 tablet (25 mg total) by mouth daily. 90 tablet 3   traZODone  (DESYREL ) 50 MG tablet Take 75 mg by mouth at bedtime.     No current facility-administered medications for this visit.    Allergies:   Fish-derived products, Shellfish allergy, Aspirin , Benadryl  [diphenhydramine ], Ivp dye [iodinated contrast media], and Latex    Social History:  The patient  reports that she has never smoked. She has never used smokeless tobacco. She reports that she does not currently use alcohol. She reports that she does not use drugs.   Family History:  The patient's family history includes Cancer in her brother, brother, paternal aunt, and paternal uncle; Diabetes in her daughter, father, and mother; Heart attack in her father; Heart failure in her father and mother; Hypertension in her father, maternal grandfather, maternal grandmother, mother, paternal grandfather, and paternal grandmother; Stroke in her father; Thyroid disease in her brother.    ROS:  Please see the history of present illness.   Otherwise, review of systems are positive for none.   All other systems are reviewed and negative.    PHYSICAL EXAM: VS:  BP 95/64 (BP Location: Left Arm)   Pulse 71   Ht 5' 1 (1.549 m)   Wt 164 lb (74.4  kg)   SpO2 97%   BMI 30.99 kg/m  , BMI Body mass index is 30.99 kg/m. GEN: Well nourished, well developed, in no acute distress  HEENT: normal  Neck: no JVD, carotid bruits, or masses Cardiac: RRR; no murmurs, rubs, or gallops,no edema  Respiratory:  some expiratory wheezing. bilaterally, normal work of breathing GI: soft, nontender, nondistended, + BS MS: no deformity or atrophy  Skin: warm and dry, no rash Neuro:  Strength and sensation are intact Psych: euthymic mood, full affect   EKG:  EKG is not ordered today. The ekg ordered today demonstrates N/A   Recent Labs: 12/21/2022: ALT 21; B Natriuretic Peptide 25.0; BUN 26; Creatinine, Ser 1.00; Hemoglobin 12.5; Platelets 255; Potassium 4.4; Sodium 137    Lipid Panel    Component Value Date/Time   CHOL 68 12/16/2019 0237   TRIG 123 12/16/2019 0237   HDL 22 (L) 12/16/2019 0237   CHOLHDL 3.1 12/16/2019 0237   VLDL 25 12/16/2019 0237   LDLCALC 21 12/16/2019 0237      Wt Readings from Last 3 Encounters:  09/11/23 164 lb (74.4 kg)  12/21/22 165 lb 5.5 oz (75 kg)  12/21/22 165 lb 5.5 oz (75 kg)    Labs dated 09/14/20: BUn 28, creatinine 1.5. cholesterol 124, triglycerides 144, HDL 33, LDL 57. A1c 7%. Otherwise CMET normal. Dated 01/30/21: cholesterol 125, triglycerides 183, HDL 42, LDL 46.  Dated 03/16/21: glucose 190. Creatinine 1.13. A1c 6.2%. otherwise CMET normal.  Other studies Reviewed: Additional studies/ records that were reviewed today include:   Cardiac cath/PCI 10/21/19:  CORONARY STENT INTERVENTION  RIGHT/LEFT HEART CATH AND CORONARY ANGIOGRAPHY  Conclusion    1st Mrg lesion is 95% stenosed. Post intervention, there is a 0% residual stenosis. Prox RCA to Mid RCA lesion is 20% stenosed. Prox LAD lesion is 20% stenosed. Mid LAD lesion is 40% stenosed. A stent was successfully placed.   Coronary obstructive disease with 20% proximal and 40% smooth mid LAD stenoses; 30% proximal circumflex stenosis with 95%  near ostial  stenosis in a bifurcating OM1 vessel; and 20% mid RCA narrowing.   Normal to minimally increased right heart pressures; mean PA pressure 21 mmHg   Successful PCI to 95% very proximal circumflex marginal stenosis with insertion of a 2.25 x 8 mm DES stent with the stenosis being reduced to 0%.   RECOMMENDATION: Recommend DAPT for 12 months.  Guideline directed medical therapy for the patient's significant cardiomyopathy which is out of proportion to her high-grade circumflex marginal stenosis.  Aggressive lipid-lowering therapy with target LDL less than 70.  Echo 12/13/19: IMPRESSIONS     1. EF similar to August diffuse hypokinesis worse in the septum , apex  and inferior walls Basal lateral wall only area with preserved function .  Left ventricular ejection fraction, by estimation, is 25 to 30%. The left  ventricle has severely decreased  function. The left ventricle demonstrates regional wall motion  abnormalities (see scoring diagram/findings for description). The left  ventricular internal cavity size was moderately dilated. Left ventricular  diastolic parameters were normal.   2. Right ventricular systolic function was not well visualized. The right  ventricular size is not well visualized. There is mildly elevated  pulmonary artery systolic pressure.   3. The mitral valve is normal in structure. Trivial mitral valve  regurgitation. No evidence of mitral stenosis.   4. Tricuspid valve regurgitation is moderate.   5. The aortic valve is normal in structure. Aortic valve regurgitation is  not visualized. Mild to moderate aortic valve sclerosis/calcification is  present, without any evidence of aortic stenosis.   6. The inferior vena cava is normal in size with greater than 50%  respiratory variability, suggesting right atrial pressure of 3 mmHg.   Echo 08/29/20: IMPRESSIONS     1. Left ventricular ejection fraction, by estimation, is 60 to 65%. The  left ventricle has  normal function. The left ventricle has no regional  wall motion abnormalities. Left ventricular diastolic parameters are  consistent with Grade I diastolic  dysfunction (impaired relaxation). Elevated left atrial pressure.   2. Right ventricular systolic function is normal. The right ventricular  size is normal. There is normal pulmonary artery systolic pressure.   3. The mitral valve is normal in structure. Trivial mitral valve  regurgitation. No evidence of mitral stenosis.   4. The aortic valve is tricuspid. Aortic valve regurgitation is not  visualized. Mild aortic valve sclerosis is present, with no evidence of  aortic valve stenosis.   5. The inferior vena cava is normal in size with greater than 50%  respiratory variability, suggesting right atrial pressure of 3 mmHg.   ASSESSMENT AND PLAN:  1. Chronic combined systolic/diastolic CHF-EF 20-25% initially. nonischemic.  Underwent cardiac catheterization 10/21/2019 which showed 95% OM lesion was treated with PCI/DES x1, 20% medial RCA, 20% proximal LAD had 40% medial LAD.  It was felt that her cardiomyopathy was out of proportion to her  OM stenosis.   On last Echo in June 2022  EF had normalized to 60-65%.  She is using lasix  20 mg daily Continue Entresto , aldacton- given asthma/COPD recommend tapering off Toprol  XL    2. Coronary artery disease-s/p DES of OM 1 in August 2021. She is asymptomatic.  Continue aspirin   She still  has Brilinta  listed - will discontinue   3. Essential hypertension-BP is well controlled on current therapy   4. Type 2 diabetes- Followed by PCP   5. HLD. On statin. LDL 13     Current medicines are reviewed at length  with the patient today.  The patient does not have concerns regarding medicines.  The following changes have been made:  See above  Labs/ tests ordered today include:   No orders of the defined types were placed in this encounter.     Disposition:   FU with me or APP in one  year  Signed, Karo Rog [place], MD  09/11/2023 3:15 PM    [facility] [address] [place], [place], [address] Phone [phone], Fax [phone]

## 2023-09-11 ENCOUNTER — Ambulatory Visit: Attending: Cardiology | Admitting: Cardiology

## 2023-09-11 ENCOUNTER — Encounter: Payer: Self-pay | Admitting: Cardiology

## 2023-09-11 VITALS — BP 95/64 | HR 71 | Ht 61.0 in | Wt 164.0 lb

## 2023-09-11 DIAGNOSIS — I2510 Atherosclerotic heart disease of native coronary artery without angina pectoris: Secondary | ICD-10-CM

## 2023-09-11 DIAGNOSIS — I10 Essential (primary) hypertension: Secondary | ICD-10-CM | POA: Diagnosis not present

## 2023-09-11 DIAGNOSIS — I5022 Chronic systolic (congestive) heart failure: Secondary | ICD-10-CM

## 2023-09-11 MED ORDER — SPIRONOLACTONE 25 MG PO TABS: 25.0000 mg | ORAL_TABLET | Freq: Every day | ORAL | 3 refills | Status: DC

## 2023-09-11 MED ORDER — FUROSEMIDE 20 MG PO TABS: 20.0000 mg | ORAL_TABLET | Freq: Every day | ORAL | 3 refills | Status: DC

## 2023-09-11 NOTE — Patient Instructions (Signed)
 Medication Instructions:  Stop Brilinta  Take Lasix  ( Furosemide  ) 20 mg daily Taper off Toprol    Take 25 mg daily for 4 days Then take 12.5 mg daily for 4 days Then Stop Continue all other medications  *If you need a refill on your cardiac medications before your next appointment, please call your pharmacy*  Lab Work: None ordered  Testing/Procedures: None ordered  Follow-Up: At [facility], you and your health needs are our priority.  As part of our continuing mission to provide you with exceptional heart care, our providers are all part of one team.  This team includes your primary Cardiologist (physician) and Advanced Practice Providers or APPs (Physician Assistants and Nurse Practitioners) who all work together to provide you with the care you need, when you need it.  Your next appointment:  1 year     Call in March to schedule July appointment     Provider:  Dr.Jordan   We recommend signing up for the patient portal called MyChart.  Sign up information is provided on this After Visit Summary.  MyChart is used to connect with patients for Virtual Visits (Telemedicine).  Patients are able to view lab/test results, encounter notes, upcoming appointments, etc.  Non-urgent messages can be sent to your provider as well.   To learn more about what you can do with MyChart, go to [email or web address].

## 2023-09-23 ENCOUNTER — Ambulatory Visit: Payer: Self-pay | Admitting: Pulmonary Disease

## 2023-09-23 ENCOUNTER — Encounter: Payer: Self-pay | Admitting: Physician Assistant

## 2023-09-23 NOTE — Telephone Encounter (Signed)
 FYI Appt made for 09/24/2023 with you

## 2023-09-23 NOTE — Telephone Encounter (Signed)
 FYI Only or Action Required?: Action required by provider: refusing ED, requesting appt.  Patient is followed in Pulmonology for asthma, last seen on 10/03/2021 by Kara Dorn NOVAK, MD.  Called Nurse Triage reporting Shortness of Breath, Wheezing, Chest Pain, Weakness, Headache, Nausea, Arm Pain, and needs medication clarification.  Symptoms began several weeks ago.  Interventions attempted: Prescription medications: prednisone  x2, injection, albuterol  inhaler every hour, Rescue inhaler, Maintenance inhaler, Nebulizer treatments, Increased fluids/rest, and Other: PCP appts, cardio appt.  Symptoms are: gradually worsening.  Triage Disposition: Go to ED Now (Notify PCP)  Patient/caregiver understands and will follow disposition?: No, refuses disposition      Copied from CRM [id number]. Topic: Clinical - Red Word Triage >> Sep 23, 2023  1:00 PM Nicole [name] wrote: [facility] that prompted transfer to Nurse Triage: wheezing, shortness of breath, asthma flare up Reason for Disposition  Difficulty breathing  Answer Assessment - Initial Assessment Questions E2C2 Pulmonary Triage - Initial Assessment Questions Chief Complaint (e.g., cough, sob, wheezing, fever, chills, sweat or additional symptoms) *Go to specific symptom protocol after initial questions. SOB more than usual Chest pain under my chest, just seen cardio Chest pain sometimes hurts like sore and sometimes when can't breathe hurts along with me trying to breathe, sometimes it'[name] there even when not having trouble breathing but heart doc said was okay States comes and goes brief, denies it lasting longer than 5 min at a time HR 115 last week, came down when was on prednisone  but out of prednisone  Headaches Taken so much albuterol , sick on the stomach, nauseous Getting kind of weak Gone to regular doc, got shot and prednisone , got more prednisone , come back since done with prednisone  Guess it'[name] the weather Walking making me bit  weak One arm every now and then sharp pain, not sure what'[name] going on, about a week Speaking in phrases to full sentences  How long have symptoms been present? 3 weeks  Have you used your inhalers/maintenance medication? Yes If yes, What medications? Albuterol  nebulizer 1x/day Got 2 and never know which one I should be using, was using advair and this one is symbicort  If inhaler, ask How many puffs and how often? Note: Review instructions on medication in the chart. Albuterol  inhaler at least once every hour or so, a little bit improvement, used so much of it, not working as well  OXYGEN : Do you wear supplemental oxygen ? Yes If yes, How many liters are you supposed to use? Use at night, 2L, using during the day sometimes  Do you monitor your oxygen  levels? Yes If yes, What is your reading (oxygen  level) today? Pt not sure how to read her pulse ox, two numbers 77 and 93 with 77 on top, cannot see the brand on pulse ox for nurse to look it up and confirm, pt states there'[name] no bpm, %, or heart symbol for either number  7. CARDIAC RISK FACTORS: Do you have any history of heart problems or risk factors for heart disease? (e.g., angina, prior heart attack; diabetes, high blood pressure, high cholesterol, smoker, or strong family history of heart disease)     significant  8. PULMONARY RISK FACTORS: Do you have any history of lung disease?  (e.g., blood clots in lung, asthma, emphysema, birth control pills)     Asthma   Advised pt go to ED with symptoms, pt refusing, requesting appt with pulm, sending message to pulm for pt to receive call back with further recommendations/appt options. Advised pt go to  ED if any worsening.  Protocols used: Chest Pain-A-AH

## 2023-09-24 ENCOUNTER — Ambulatory Visit

## 2023-09-24 ENCOUNTER — Telehealth: Payer: Self-pay

## 2023-09-24 ENCOUNTER — Ambulatory Visit: Payer: Self-pay

## 2023-09-24 VITALS — BP 107/70 | HR 92 | Temp 97.6°F | Ht 61.0 in | Wt 158.2 lb

## 2023-09-24 DIAGNOSIS — R079 Chest pain, unspecified: Secondary | ICD-10-CM | POA: Diagnosis not present

## 2023-09-24 DIAGNOSIS — J4521 Mild intermittent asthma with (acute) exacerbation: Secondary | ICD-10-CM | POA: Diagnosis not present

## 2023-09-24 LAB — TROPONIN I: Troponin I: <3 ng/L (ref <=47)

## 2023-09-24 LAB — D-DIMER, QUANTITATIVE: D-Dimer, Quant: 0.83 ug{FEU}/mL — ABNORMAL HIGH (ref <0.50)

## 2023-09-24 MED ORDER — PREDNISONE 10 MG (21) PO TBPK: ORAL_TABLET | Freq: Every day | ORAL | 0 refills | Status: DC

## 2023-09-24 MED ORDER — AMOXICILLIN-POT CLAVULANATE 875-125 MG PO TABS
1.0000 | ORAL_TABLET | Freq: Two times a day (BID) | ORAL | 0 refills | Status: DC
Start: 2023-09-24 — End: 2023-11-18

## 2023-09-24 NOTE — Patient Instructions (Addendum)
   Recommend evaluation in the emergency department for chest and left arm pain; however, patient refuses.  Have labs drawn here at clinic today;  follow up in Mychart or call office for results.  Take steroids and antibiotics as prescribed.  Resume Symbicort; take 2 puffs inhaled twice daily.

## 2023-09-24 NOTE — Assessment & Plan Note (Signed)
-    Some of her issues with recurrent exacerbation stem from medication noncompliance with her maintenance inhaler -  Steroids and antibiotics prescribed today -  Resume Symbicort 2 puffs inhaled BID -  Follow up in 4 weeks -  Once she has stabilized, it may be beneficial to update PFTs and determine if her maintenance therapy needs to be escalated

## 2023-09-24 NOTE — Assessment & Plan Note (Signed)
-   Differential includes pulmonary embolism vs. myocardial infarction vs. Asthma exacerbation vs. Costochondritis, therefore the recommendation is for the patient to be fully evaluated in the ER, however, she refuses. - She is agreeable to blood work; will check a D-dimer and troponin level.  Reports that she does have MyChart access to see these results.  Voiced understanding that the elevation in either or both of these would necessitate ER evaluation.

## 2023-09-24 NOTE — Progress Notes (Signed)
 @Patient  ID: Nicole [name], female    DOB: 07/13/1945, 78 y.o.   MRN: [id number]  Chief Complaint  Patient presents with   Acute Visit    SOB with exertion, wheezing, 'smothering' chest pain, headache, sharp arm pain, general weakness and nausea.  Diarrhea.  Pt states symptoms have been going on for 3 weeks now.     Referring provider: Nichole Senior, MD  HPI: Patient is a 78 year old female with past medical history of asthma who presents today for evaluation of shortness of breath.  Today she reports that she has had shortness of breath and chest pressure as well as left arm pain for the last 3 weeks.  She has been treated on 2 separate occasions in the last 3 weeks for an asthma exacerbation with 2 courses of steroids and 1 course of azithromycin.  Despite treatment she continues to have shortness of breath, wheezing, and chest and left arm pressure.  She did contact our office yesterday and it was recommended that she go to the ER for evaluation, however, she refused.  Today she reports that her main complaint is shortness of breath and dyspnea on exertion.  She denies productive cough or really cough at all.  Complains of central upper abdominal and chest pressure that sometimes radiates down the left arm.  Feels that this is constant and has really not been relieved throughout the course of the last 3 weeks despite treatment for asthma exacerbation.  She denies any nausea, appetite changes, hemoptysis, fever, chills, vomiting, diarrhea, headache or other complaints.  She also reports that she has not been taking her maintenance inhaler regularly.  TEST/EVENTS : tx x2 for asthma exacerbation in the last 3 weeks  Allergies  Allergen Reactions   Fish-Derived Products Anaphylaxis   Shellfish Allergy Shortness Of Breath    In dye for scan.   Aspirin  Nausea Only and Other (See Comments)    Pt stated it only gives her problems when she takes the high dose (325 mg) GI Intolerance,    Benadryl  [Diphenhydramine ] Other (See Comments)    flips out with it. Eyes roll to the back of her head.   Ivp Dye [Iodinated Contrast Media] Other (See Comments)    Hx of ? anaphlaxis   Latex Other (See Comments)    Pt says she gets a stinky infection    Immunization History  Administered Date(s) Administered   Moderna Sars-Covid-2 Vaccination 11/23/2019    Past Medical History:  Diagnosis Date   Anemia    Anxiety    Back pain    CAD (coronary artery disease) 10/22/2019   DES to OM1 August 2021   Colon polyps    COVID-28 June 2019   Endometriosis    Fibromyalgia    GERD with esophagitis    Follows with Dr. Aneita with GI, EGD in the past   IBS (irritable bowel syndrome)    Major depressive disorder    Migraine    Mild intermittent asthma    Mixed diabetic hyperlipidemia associated with type 2 diabetes mellitus (HCC)    Type 2 diabetes mellitus (HCC)    Urinary incontinence     Tobacco History: Social History   Tobacco Use  Smoking Status Never  Smokeless Tobacco Never   Counseling given: Not Answered   Outpatient Medications Prior to Visit  Medication Sig Dispense Refill   albuterol  (PROVENTIL ) (5 MG/ML) 0.5% nebulizer solution Take 0.5 mLs (2.5 mg total) by nebulization every 6 (six) hours  as needed for wheezing or shortness of breath. 20 mL 0   albuterol  (VENTOLIN  HFA) 108 (90 Base) MCG/ACT inhaler Inhale 2 puffs into the lungs every 4 (four) hours as needed for wheezing or shortness of breath.     aspirin  81 MG tablet Take 81 mg by mouth daily.     atorvastatin  (LIPITOR ) 80 MG tablet TAKE 1 TABLET BY MOUTH AT BEDTIME 90 tablet 3   DULoxetine  (CYMBALTA ) 30 MG capsule Take 30 mg by mouth daily.     DULoxetine  (CYMBALTA ) 60 MG capsule Take 60 mg by mouth daily.     ergocalciferol  (VITAMIN D2) 50000 UNITS capsule Take 50,000 Units by mouth once a week.     estradiol (ESTRACE) 0.1 MG/GM vaginal cream Place 1 Applicatorful vaginally as needed.      fluticasone  (FLONASE ) 50 MCG/ACT nasal spray Place 1 spray into both nostrils daily as needed for allergies or rhinitis.      Fluticasone -Salmeterol (ADVAIR) 250-50 MCG/DOSE AEPB Inhale 1 puff into the lungs 2 (two) times daily.     furosemide  (LASIX ) 20 MG tablet Take 1 tablet (20 mg total) by mouth daily. 90 tablet 3   ipratropium-albuterol  (DUONEB) 0.5-2.5 (3) MG/3ML SOLN Use 1 vial by nebulization 2 (two) times daily. Use twice a day scheduled 360 mL 0   JANUMET  50-500 MG tablet Take 1 tablet by mouth daily.     lidocaine  (LIDODERM ) 5 % 1 patch daily.     loratadine  (CLARITIN ) 10 MG tablet Take 1 tablet (10 mg total) by mouth daily. 30 tablet 0   LORazepam  (ATIVAN ) 1 MG tablet Take 1 tablet (1 mg total) by mouth 2 (two) times daily. 3 tablet 0   meclizine  (ANTIVERT ) 25 MG tablet Take 25 mg by mouth daily as needed for dizziness.     metoprolol  succinate (TOPROL -XL) 25 MG 24 hr tablet Take 1 tablet (25 mg total) by mouth daily. 15 tablet 0   omeprazole (PRILOSEC) 20 MG capsule Take 20 mg by mouth 2 (two) times daily before a meal.     ondansetron  (ZOFRAN ) 4 MG tablet Take 1 tablet by mouth daily.     ONETOUCH DELICA LANCETS 33G MISC      ONETOUCH VERIO test strip      oxyCODONE -acetaminophen  (PERCOCET) 10-325 MG tablet Take 1 tablet by mouth 2 (two) times daily as needed for pain. 3 tablet 0   pantoprazole  (PROTONIX ) 40 MG tablet Take 40 mg by mouth 2 (two) times daily.     Potassium Chloride  ER 20 MEQ TBCR Take 1 tablet by mouth daily.     PROAIR  HFA 108 (90 Base) MCG/ACT inhaler Inhale 2 puffs into the lungs every 4 (four) hours as needed for wheezing or shortness of breath. Uses as rescue inhaler Reported on 06/20/2015 6.7 g 0   sacubitril -valsartan  (ENTRESTO ) 24-26 MG Take 1 tablet by mouth 2 (two) times daily. 30 tablet 0   sacubitril -valsartan  (ENTRESTO ) 97-103 MG Take 1 tablet by mouth 2 (two) times daily. 60 tablet 10   spironolactone  (ALDACTONE ) 25 MG tablet Take 1 tablet (25 mg total)  by mouth daily. 90 tablet 3   traZODone  (DESYREL ) 50 MG tablet Take 75 mg by mouth at bedtime.     No facility-administered medications prior to visit.     Review of Systems:   Constitutional:   No  weight loss, night sweats,  Fevers, chills, fatigue, or  lassitude.  HEENT:   No headaches,  Difficulty swallowing,  Tooth/dental problems, or  Sore  throat,                No sneezing, itching, ear ache, nasal congestion, post nasal drip,   CV: Positive for chest and left arm pain orthopnea, PND, swelling in lower extremities, anasarca, dizziness, palpitations, syncope.   GI  No heartburn, indigestion, abdominal pain, nausea, vomiting, diarrhea, change in bowel habits, loss of appetite, bloody stools.   Resp: Positive for shortness of breath with exertion or at rest.  No excess mucus, no productive cough,  No non-productive cough,  No coughing up of blood.  No change in color of mucus.  No wheezing.  No chest wall deformity  Skin: no rash or lesions.  GU: no dysuria, change in color of urine, no urgency or frequency.  No flank pain, no hematuria   MS:  No joint pain or swelling.  No decreased range of motion.  No back pain.      Physical Exam  BP 107/70 (BP Location: Left Arm, Cuff Size: Large)   Pulse 92   Temp 97.6 F (36.4 C) (Oral)   Ht 5' 1 (1.549 m)   Wt 158 lb 3.2 oz (71.8 kg)   SpO2 93%   BMI 29.89 kg/m   GEN: A/Ox3; pleasant , NAD, well nourished speaks in full sentences.   HEENT:  [place]/AT,  EACs-clear, TMs-wnl, NOSE-clear, THROAT-clear, no lesions, no postnasal drip or exudate noted.   NECK:  Supple w/ fair ROM; no JVD; normal carotid impulses w/o bruits; no thyromegaly or nodules palpated; no lymphadenopathy.    RESP I/E wheezes throughout with fairly good aeration no rales or rhonchi. no accessory muscle use, no dullness to percussion  CARD:  RRR, no m/r/g, no peripheral edema, pulses intact, no cyanosis or clubbing.  Some producible chest discomfort with  palpation  GI:   Soft & nt; nml bowel sounds; no organomegaly or masses detected.   Musco: Warm bil, no deformities or joint swelling noted.  Negative Homans' sign bilaterally.   Neuro: alert, no focal deficits noted.    Skin: Warm, no lesions or rashes.  No skin color or temperature changes noted in the bilateral upper or lower extremities.    Lab Results:  CBC    Component Value Date/Time   WBC 10.4 12/21/2022 1154   RBC 4.14 12/21/2022 1154   HGB 12.5 12/21/2022 1154   HCT 38.6 12/21/2022 1154   PLT 255 12/21/2022 1154   MCV 93.2 12/21/2022 1154   MCH 30.2 12/21/2022 1154   MCHC 32.4 12/21/2022 1154   RDW 13.5 12/21/2022 1154   LYMPHSABS 1.6 12/21/2022 1154   MONOABS 0.4 12/21/2022 1154   EOSABS 0.2 12/21/2022 1154   BASOSABS 0.0 12/21/2022 1154    BMET    Component Value Date/Time   NA 137 12/21/2022 1154   NA 143 08/01/2021 1532   K 4.4 12/21/2022 1154   CL 105 12/21/2022 1154   CO2 23 12/21/2022 1154   GLUCOSE 147 (H) 12/21/2022 1154   BUN 26 (H) 12/21/2022 1154   BUN 9 08/01/2021 1532   CREATININE 1.00 12/21/2022 1154   CALCIUM  9.7 12/21/2022 1154   GFRNONAA 59 (L) 12/21/2022 1154   GFRAA 58 (L) 04/17/2020 1600    BNP    Component Value Date/Time   BNP 25.0 12/21/2022 1154    ProBNP No results found for: PROBNP  Imaging: No results found.  Administration History     None          Latest Ref Rng &  Units 10/03/2021    3:06 PM  PFT Results  FVC-Pre L 1.76   FVC-Predicted Pre % 69   FVC-Post L 1.79   FVC-Predicted Post % 70   Pre FEV1/FVC % % 71   Post FEV1/FCV % % 74   FEV1-Pre L 1.24   FEV1-Predicted Pre % 65   FEV1-Post L 1.32   DLCO uncorrected ml/min/mmHg 16.20   DLCO UNC% % 93   DLCO corrected ml/min/mmHg 16.20   DLCO COR %Predicted % 93   DLVA Predicted % 118   TLC L 4.32   TLC % Predicted % 93   RV % Predicted % 104     No results found for: NITRICOXIDE   Assessment & Plan:   Assessment & Plan Mild  intermittent asthma with (acute) exacerbation -  Some of her issues with recurrent exacerbation stem from medication noncompliance with her maintenance inhaler -  Steroids and antibiotics prescribed today -  Resume Symbicort 2 puffs inhaled BID -  Follow up in 4 weeks -  Once she has stabilized, it may be beneficial to update PFTs and determine if her maintenance therapy needs to be escalated Chest pain in adult - Differential includes pulmonary embolism vs. myocardial infarction vs. Asthma exacerbation vs. Costochondritis, therefore the recommendation is for the patient to be fully evaluated in the ER, however, she refuses. - She is agreeable to blood work; will check a D-dimer and troponin level.  Reports that she does have MyChart access to see these results.  Voiced understanding that the elevation in either or both of these would necessitate ER evaluation.   Return in about 4 weeks (around 10/22/2023) for asthma.  Candis Dandy, PA-C 09/24/2023

## 2023-09-25 ENCOUNTER — Telehealth: Payer: Self-pay

## 2023-09-25 MED ORDER — PREDNISONE 10 MG PO TABS
ORAL_TABLET | ORAL | 0 refills | Status: DC
Start: 2023-09-25 — End: 2023-09-30

## 2023-09-25 NOTE — Telephone Encounter (Signed)
 Called patient no answer -VM full can not leave message -

## 2023-09-25 NOTE — Telephone Encounter (Signed)
 Per [facility] request new Rx has been sent   Tried to call patient to notified her and no answer and could not leave message     NFN-

## 2023-09-25 NOTE — Telephone Encounter (Signed)
Done according to med list

## 2023-09-25 NOTE — Telephone Encounter (Signed)
 [facility] calling back cause no one has gotten back in touch with pharmacy they are asking that you just send a new script with the correct quantity and directions

## 2023-09-25 NOTE — Telephone Encounter (Signed)
 Transferred [facility] to [facility]

## 2023-09-29 ENCOUNTER — Other Ambulatory Visit: Payer: Self-pay

## 2023-09-29 ENCOUNTER — Emergency Department (HOSPITAL_COMMUNITY)

## 2023-09-29 ENCOUNTER — Emergency Department (HOSPITAL_COMMUNITY)
Admission: EM | Admit: 2023-09-29 | Discharge: 2023-09-29 | Disposition: A | Discharge status: Home / Self Care | Attending: Emergency Medicine | Admitting: Emergency Medicine

## 2023-09-29 ENCOUNTER — Encounter (HOSPITAL_COMMUNITY): Payer: Self-pay | Admitting: Emergency Medicine

## 2023-09-29 DIAGNOSIS — R7989 Other specified abnormal findings of blood chemistry: Secondary | ICD-10-CM | POA: Insufficient documentation

## 2023-09-29 DIAGNOSIS — J45909 Unspecified asthma, uncomplicated: Secondary | ICD-10-CM | POA: Diagnosis not present

## 2023-09-29 DIAGNOSIS — Z91040 Latex allergy status: Secondary | ICD-10-CM | POA: Insufficient documentation

## 2023-09-29 DIAGNOSIS — Z8709 Personal history of other diseases of the respiratory system: Secondary | ICD-10-CM

## 2023-09-29 DIAGNOSIS — Z7982 Long term (current) use of aspirin: Secondary | ICD-10-CM | POA: Diagnosis not present

## 2023-09-29 DIAGNOSIS — R0602 Shortness of breath: Secondary | ICD-10-CM | POA: Insufficient documentation

## 2023-09-29 LAB — BASIC METABOLIC PANEL WITH GFR
Anion gap: 9 (ref 5–15)
BUN: 20 mg/dL (ref 8–23)
CO2: 20 mmol/L — ABNORMAL LOW (ref 22–32)
Calcium: 9.9 mg/dL (ref 8.9–10.3)
Chloride: 107 mmol/L (ref 98–111)
Creatinine, Ser: 1.2 mg/dL — ABNORMAL HIGH (ref 0.44–1.00)
GFR, Estimated: 47 mL/min — ABNORMAL LOW (ref >60)
Glucose, Bld: 151 mg/dL — ABNORMAL HIGH (ref 70–99)
Potassium: 3.9 mmol/L (ref 3.5–5.1)
Sodium: 136 mmol/L (ref 135–145)

## 2023-09-29 LAB — CBC
HCT: 34.2 % — ABNORMAL LOW (ref 36.0–46.0)
Hemoglobin: 11.4 g/dL — ABNORMAL LOW (ref 12.0–15.0)
MCH: 29.8 pg (ref 26.0–34.0)
MCHC: 33.3 g/dL (ref 30.0–36.0)
MCV: 89.5 fL (ref 80.0–100.0)
Platelets: 310 K/uL (ref 150–400)
RBC: 3.82 MIL/uL — ABNORMAL LOW (ref 3.87–5.11)
RDW: 12.8 % (ref 11.5–15.5)
WBC: 10.5 K/uL (ref 4.0–10.5)
nRBC: 0 % (ref 0.0–0.2)

## 2023-09-29 LAB — TROPONIN I (HIGH SENSITIVITY)
Troponin I (High Sensitivity): 3 ng/L (ref <18)
Troponin I (High Sensitivity): 3 ng/L (ref <18)

## 2023-09-29 MED ORDER — OXYCODONE-ACETAMINOPHEN 5-325 MG PO TABS
1.0000 | ORAL_TABLET | Freq: Once | ORAL | Status: AC
  Administered 2023-09-29: 1 via ORAL
  Filled 2023-09-29: qty 1

## 2023-09-29 MED ORDER — ACETAMINOPHEN 500 MG PO TABS
1000.0000 mg | ORAL_TABLET | Freq: Once | ORAL | Status: DC
  Filled 2023-09-29: qty 2

## 2023-09-29 MED ORDER — IPRATROPIUM BROMIDE 0.02 % IN SOLN
0.5000 mg | Freq: Once | RESPIRATORY_TRACT | Status: AC
  Administered 2023-09-29: 0.5 mg via RESPIRATORY_TRACT
  Filled 2023-09-29: qty 2.5

## 2023-09-29 MED ORDER — ENOXAPARIN SODIUM 80 MG/0.8ML IJ SOSY
1.0000 mg/kg | PREFILLED_SYRINGE | Freq: Once | INTRAMUSCULAR | Status: AC
  Administered 2023-09-29: 72.5 mg via SUBCUTANEOUS
  Filled 2023-09-29: qty 0.72

## 2023-09-29 MED ORDER — FAMOTIDINE 20 MG PO TABS
20.0000 mg | ORAL_TABLET | Freq: Once | ORAL | Status: AC
  Administered 2023-09-29: 20 mg via ORAL
  Filled 2023-09-29: qty 1

## 2023-09-29 MED ORDER — HYDROXYZINE HCL 50 MG/ML IM SOLN
25.0000 mg | Freq: Once | INTRAMUSCULAR | Status: DC
  Filled 2023-09-29: qty 0.5

## 2023-09-29 MED ORDER — ALBUTEROL SULFATE (2.5 MG/3ML) 0.083% IN NEBU
5.0000 mg | INHALATION_SOLUTION | Freq: Once | RESPIRATORY_TRACT | Status: AC
  Administered 2023-09-29: 5 mg via RESPIRATORY_TRACT
  Filled 2023-09-29: qty 6

## 2023-09-29 MED ORDER — METHYLPREDNISOLONE SODIUM SUCC 125 MG IJ SOLR
125.0000 mg | Freq: Once | INTRAMUSCULAR | Status: DC
  Filled 2023-09-29: qty 2

## 2023-09-29 MED ORDER — METHYLPREDNISOLONE SODIUM SUCC 125 MG IJ SOLR: 125.0000 mg | Freq: Once | INTRAMUSCULAR | Status: DC

## 2023-09-29 MED ORDER — FAMOTIDINE IN NACL 20-0.9 MG/50ML-% IV SOLN
20.0000 mg | Freq: Once | INTRAVENOUS | Status: AC
  Administered 2023-09-29: 20 mg via INTRAVENOUS
  Filled 2023-09-29: qty 50

## 2023-09-29 MED ORDER — ALUM & MAG HYDROXIDE-SIMETH 200-200-20 MG/5ML PO SUSP
30.0000 mL | Freq: Once | ORAL | Status: AC
  Administered 2023-09-29: 30 mL via ORAL
  Filled 2023-09-29: qty 30

## 2023-09-29 MED ORDER — DIAZEPAM 5 MG/ML IJ SOLN: 2.5000 mg | Freq: Once | INTRAMUSCULAR | Status: DC

## 2023-09-29 MED ORDER — DIAZEPAM 5 MG/ML IJ SOLN
2.5000 mg | Freq: Once | INTRAMUSCULAR | Status: AC
  Administered 2023-09-29: 2.5 mg via INTRAVENOUS
  Filled 2023-09-29: qty 2

## 2023-09-29 NOTE — ED Triage Notes (Signed)
 Pt also complains of bilateral Lower leg aches, dizziness and weakness.  Also endorses visual hallucination out of left eye, states she sees a man, woman and child.

## 2023-09-29 NOTE — ED Triage Notes (Addendum)
 Pt sent by pulmonology to r/o PE; c/o cp and sob x 3 weeks; endorses elevated d dimer from recent blood work; also c/o shooting pain in [place]

## 2023-09-29 NOTE — Progress Notes (Signed)
 Pt notified she will proceed to ER
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
## 2023-09-29 NOTE — Discharge Instructions (Addendum)
 It was our pleasure to provide your ER care today - we hope that you feel better.  As we discussed, getting a VQ scan to further investigate your elevated lab test (d-dimer) is not available until tomorrow AM - return tomorrow AM after 7 AM to get VQ scan done.   Return to ER right away if worse, new symptoms, fevers, acutely worsening shortness of breath, persistent or recurrent chest pain, or other concern.   You were given sedating meds in the ER - no driving for the next 6 hours.

## 2023-09-29 NOTE — ED Notes (Signed)
 pt would like to have DNR reversed

## 2023-09-29 NOTE — ED Provider Triage Note (Signed)
 Emergency Medicine Provider Triage Evaluation Note  Nicole [name] , a 78 y.o. female  was evaluated in triage.  Pt complains of elevated D-dimer which was drawn on Thursday, sent here for ruling out of a pulmonary embolism.  No prior history of pulmonary embolism.  Has been more short of breath lately, however she does have underlying asthma.  She is also telling me that she is having hallucinations out of the left eye, is usually a little lady sweeping along with the blonde child also present only at night.  Review of Systems  Positive: Shortness of breath, chest pain, hallucinations Negative: Fever, leg swelling  Physical Exam  BP (!) 147/68 (BP Location: Left Arm)   Pulse 77   Temp 97.6 F (36.4 C)   Resp (!) 21   SpO2 99%  Gen:   Awake, no distress   Resp:  Normal effort  MSK:   Moves extremities without difficulty  Other:  Wheezing throughout. No pitting edema.   Medical Decision Making  Medically screening exam initiated at 12:52 PM.  Appropriate orders placed.  Nicole [name] was informed that the remainder of the evaluation will be completed by another provider, this initial triage assessment does not replace that evaluation, and the importance of remaining in the ED until their evaluation is complete.     Odie Edmonds, PA-C 09/29/23 1255

## 2023-09-30 ENCOUNTER — Emergency Department (HOSPITAL_COMMUNITY)
Admission: EM | Admit: 2023-09-30 | Discharge: 2023-09-30 | Disposition: A | Discharge status: Home / Self Care | Attending: Emergency Medicine | Admitting: Emergency Medicine

## 2023-09-30 ENCOUNTER — Emergency Department (HOSPITAL_COMMUNITY)

## 2023-09-30 ENCOUNTER — Other Ambulatory Visit: Payer: Self-pay

## 2023-09-30 ENCOUNTER — Encounter (HOSPITAL_COMMUNITY): Payer: Self-pay

## 2023-09-30 DIAGNOSIS — Z91040 Latex allergy status: Secondary | ICD-10-CM | POA: Diagnosis not present

## 2023-09-30 DIAGNOSIS — Z79899 Other long term (current) drug therapy: Secondary | ICD-10-CM | POA: Diagnosis not present

## 2023-09-30 DIAGNOSIS — Z7982 Long term (current) use of aspirin: Secondary | ICD-10-CM | POA: Diagnosis not present

## 2023-09-30 DIAGNOSIS — I10 Essential (primary) hypertension: Secondary | ICD-10-CM | POA: Insufficient documentation

## 2023-09-30 DIAGNOSIS — R079 Chest pain, unspecified: Secondary | ICD-10-CM | POA: Diagnosis present

## 2023-09-30 DIAGNOSIS — J4521 Mild intermittent asthma with (acute) exacerbation: Secondary | ICD-10-CM | POA: Diagnosis not present

## 2023-09-30 DIAGNOSIS — J45909 Unspecified asthma, uncomplicated: Secondary | ICD-10-CM | POA: Diagnosis not present

## 2023-09-30 DIAGNOSIS — E119 Type 2 diabetes mellitus without complications: Secondary | ICD-10-CM | POA: Insufficient documentation

## 2023-09-30 DIAGNOSIS — I2510 Atherosclerotic heart disease of native coronary artery without angina pectoris: Secondary | ICD-10-CM | POA: Diagnosis not present

## 2023-09-30 DIAGNOSIS — R0789 Other chest pain: Secondary | ICD-10-CM

## 2023-09-30 MED ORDER — DIAZEPAM 5 MG/ML IJ SOLN
5.0000 mg | Freq: Once | INTRAMUSCULAR | Status: AC
  Administered 2023-09-30: 5 mg via INTRAVENOUS
  Filled 2023-09-30: qty 2

## 2023-09-30 MED ORDER — IPRATROPIUM-ALBUTEROL 0.5-2.5 (3) MG/3ML IN SOLN
3.0000 mL | Freq: Once | RESPIRATORY_TRACT | Status: AC
  Administered 2023-09-30: 3 mL via RESPIRATORY_TRACT
  Filled 2023-09-30: qty 3

## 2023-09-30 MED ORDER — TECHNETIUM TO 99M ALBUMIN AGGREGATED
4.0500 | Freq: Once | INTRAVENOUS | Status: AC | PRN
  Administered 2023-09-30: 4.05 via INTRAVENOUS

## 2023-09-30 MED ORDER — PREDNISONE 20 MG PO TABS: 40.0000 mg | ORAL_TABLET | Freq: Every day | ORAL | 0 refills | Status: DC

## 2023-09-30 NOTE — ED Triage Notes (Signed)
 Pt c/o chest pain, left arm pain, and shortness of breath. Pt was seen for same last night and was told to come back in the morning.

## 2023-09-30 NOTE — ED Provider Notes (Signed)
 [facility] Provider Note   CSN: [id number] Arrival date & time: 09/30/23  1058     Patient presents with: Chest Pain   Nicole [name] is a 78 y.o. female with past medical history significant for diabetes, asthma, hyperlipidemia, previous NSTEMI, bipolar disorder, CAD who presents with concern for some chest pain, left arm pain, shortness of breath.  Was seen for same last night, had flat delta troponins, but could not rule out PE at the time.  Had planned to have patient return for VQ scan.  D-dimer was elevated 0.836 days ago.  Notably it has been chronically elevated on previous evaluations around 0.8-1 when last checked 2 years ago multiple times.  She endorses occasionally hallucinating people at night, reports no hallucinations last night, no hallucinations at this time.    Chest Pain      Prior to Admission medications   Medication Sig Start Date End Date Taking? Authorizing Provider  predniSONE  (DELTASONE ) 20 MG tablet Take 2 tablets (40 mg total) by mouth daily. 09/30/23  Yes Chandlar Guice H, PA-C  albuterol  (PROVENTIL ) (5 MG/ML) 0.5% nebulizer solution Take 0.5 mLs (2.5 mg total) by nebulization every 6 (six) hours as needed for wheezing or shortness of breath. 11/25/22   Piontek, Rocky, MD  albuterol  (VENTOLIN  HFA) 108 (90 Base) MCG/ACT inhaler Inhale 2 puffs into the lungs every 4 (four) hours as needed for wheezing or shortness of breath. 06/28/19   [provider]  amoxicillin -clavulanate (AUGMENTIN ) 875-125 MG tablet Take 1 tablet by mouth 2 (two) times daily. 09/24/23   Charley Conger, PA-C  aspirin  81 MG tablet Take 81 mg by mouth daily.    [provider]  atorvastatin  (LIPITOR ) 80 MG tablet TAKE 1 TABLET BY MOUTH AT BEDTIME 04/14/23   [place], Peter M, MD  DULoxetine  (CYMBALTA ) 30 MG capsule Take 30 mg by mouth daily. 08/10/19   [provider]  DULoxetine  (CYMBALTA ) 60 MG capsule Take 60 mg by mouth  daily. 08/10/19   [provider]  ergocalciferol  (VITAMIN D2) 50000 UNITS capsule Take 50,000 Units by mouth once a week.    [provider]  estradiol (ESTRACE) 0.1 MG/GM vaginal cream Place 1 Applicatorful vaginally as needed. 12/27/19   [provider]  fluticasone  (FLONASE ) 50 MCG/ACT nasal spray Place 1 spray into both nostrils daily as needed for allergies or rhinitis.  05/28/19   [provider]  Fluticasone -Salmeterol (ADVAIR) 250-50 MCG/DOSE AEPB Inhale 1 puff into the lungs 2 (two) times daily.    [provider]  furosemide  (LASIX ) 20 MG tablet Take 1 tablet (20 mg total) by mouth daily. 09/11/23 12/10/23  [place], Peter M, MD  ipratropium-albuterol  (DUONEB) 0.5-2.5 (3) MG/3ML SOLN Use 1 vial by nebulization 2 (two) times daily. Use twice a day scheduled 03/16/21   Singh, Prashant K, MD  JANUMET  50-500 MG tablet Take 1 tablet by mouth daily. 07/27/20   [provider]  lidocaine  (LIDODERM ) 5 % 1 patch daily. 04/08/22   [provider]  loratadine  (CLARITIN ) 10 MG tablet Take 1 tablet (10 mg total) by mouth daily. 06/28/19   Ghimire, Donalda HERO, MD  LORazepam  (ATIVAN ) 1 MG tablet Take 1 tablet (1 mg total) by mouth 2 (two) times daily. 12/18/19   Odell Celinda Balo, MD  meclizine  (ANTIVERT ) 25 MG tablet Take 25 mg by mouth daily as needed for dizziness.    [provider]  metoprolol  succinate (TOPROL -XL) 25 MG 24 hr tablet  Take 1 tablet (25 mg total) by mouth daily. 07/11/23   [place], Peter M, MD  omeprazole (PRILOSEC) 20 MG capsule Take 20 mg by mouth 2 (two) times daily before a meal. 12/27/19   [provider]  ondansetron  (ZOFRAN ) 4 MG tablet Take 1 tablet by mouth daily. 12/27/19   [provider]  AISHA PASTOR LANCETS 33G MISC  06/03/16   [provider]  [facility] VERIO test strip  06/03/16   [provider]  oxyCODONE -acetaminophen  (PERCOCET) 10-325 MG tablet Take 1 tablet by mouth 2  (two) times daily as needed for pain. 12/18/19   Odell Celinda Balo, MD  pantoprazole  (PROTONIX ) 40 MG tablet Take 40 mg by mouth 2 (two) times daily. 11/10/20   [provider]  Potassium Chloride  ER 20 MEQ TBCR Take 1 tablet by mouth daily. 12/29/19   [provider]  PROAIR  HFA 108 (90 Base) MCG/ACT inhaler Inhale 2 puffs into the lungs every 4 (four) hours as needed for wheezing or shortness of breath. Uses as rescue inhaler Reported on 06/20/2015 06/28/19   Raenelle Donalda HERO, MD  sacubitril -valsartan  (ENTRESTO ) 24-26 MG Take 1 tablet by mouth 2 (two) times daily. 07/11/23   [place], Peter M, MD  sacubitril -valsartan  (ENTRESTO ) 97-103 MG Take 1 tablet by mouth 2 (two) times daily. 04/14/23   [place], Peter M, MD  spironolactone  (ALDACTONE ) 25 MG tablet Take 1 tablet (25 mg total) by mouth daily. 09/11/23   [place], Peter M, MD  traZODone  (DESYREL ) 50 MG tablet Take 75 mg by mouth at bedtime. 12/15/20   [provider]    Allergies: Fish-derived products, Shellfish allergy, Aspirin , Benadryl  [diphenhydramine ], Ivp dye [iodinated contrast media], and Latex    Review of Systems  Cardiovascular:  Positive for chest pain.  All other systems reviewed and are negative.   Updated Vital Signs BP 128/77   Pulse 76   Temp 98.6 F (37 C)   Resp 18   Ht 5' 1 (1.549 m)   Wt 72.6 kg   SpO2 100%   BMI 30.23 kg/m   Physical Exam Vitals and nursing note reviewed.  Constitutional:      General: She is not in acute distress.    Appearance: Normal appearance.  HENT:     Head: Normocephalic and atraumatic.  Eyes:     General:        Right eye: No discharge.        Left eye: No discharge.  Cardiovascular:     Rate and Rhythm: Normal rate and regular rhythm.     Heart sounds: No murmur heard.    No friction rub. No gallop.  Pulmonary:     Effort: Pulmonary effort is normal.     Breath sounds: Normal breath sounds.     Comments: Biphasic wheezing noted, no rhonchi, no focal  consolidation, no acute respiratory distress, improved on reassessment. Abdominal:     General: Bowel sounds are normal.     Palpations: Abdomen is soft.  Skin:    General: Skin is warm and dry.     Capillary Refill: Capillary refill takes less than 2 seconds.  Neurological:     Mental Status: She is alert and oriented to person, place, and time.  Psychiatric:        Mood and Affect: Mood normal.        Behavior: Behavior normal.     (all labs ordered are listed, but only abnormal results are displayed) Labs Reviewed - No data to  display  EKG: EKG Interpretation Date/Time:  Tuesday September 30 2023 11:08:05 EDT Ventricular Rate:  88 PR Interval:  138 QRS Duration:  88 QT Interval:  358 QTC Calculation: 433 R Axis:   19  Text Interpretation: Sinus rhythm Otherwise normal ECG When compared with ECG of 29-Sep-2023 12:52, PREVIOUS ECG IS PRESENT Confirmed by Cottie Cough [phone]) on 09/30/2023 1:09:14 PM  Radiology: NM Pulmonary Perfusion Result Date: 09/30/2023 CLINICAL DATA:  Chest pain EXAM: NUCLEAR MEDICINE PERFUSION LUNG SCAN TECHNIQUE: Perfusion images were obtained in multiple projections after intravenous injection of radiopharmaceutical. RADIOPHARMACEUTICALS:  4.6 mCi Tc-56m MAA IV COMPARISON:  Chest x-ray 09/30/2023 FINDINGS: No significant wedge-shaped perfusion defects are visualized. IMPRESSION: Negative examination Electronically Signed   By: Luke Bun M.D.   On: 09/30/2023 17:46   DG Chest Portable 1 View Result Date: 09/30/2023 CLINICAL DATA:  Chest pain. EXAM: PORTABLE CHEST 1 VIEW COMPARISON:  Chest radiograph dated 09/29/2023. FINDINGS: Left lung base atelectasis. No focal consolidation, pleural effusion, or pneumothorax. Stable cardiac silhouette. Moderate hiatal hernia. No acute osseous pathology. IMPRESSION: 1. No active disease. 2. Moderate hiatal hernia. Electronically Signed   By: Vanetta Chou M.D.   On: 09/30/2023 14:34   DG Chest 1 View Result Date:  09/29/2023 CLINICAL DATA:  Chest pain, shortness of breath for 3 weeks. EXAM: CHEST  1 VIEW COMPARISON:  12/21/2022 FINDINGS: Moderate to large hiatal hernia. Atherosclerotic calcification of the aortic arch. Heart size within normal limits. The lungs appear otherwise clear.  Mild thoracic spondylosis. IMPRESSION: 1. Moderate to large hiatal hernia. 2. Aortic Atherosclerosis (ICD10-I70.0). Electronically Signed   By: Ryan Salvage M.D.   On: 09/29/2023 13:53     Procedures   Medications Ordered in the ED  ipratropium-albuterol  (DUONEB) 0.5-2.5 (3) MG/3ML nebulizer solution 3 mL (3 mLs Nebulization Given 09/30/23 1503)  diazepam  (VALIUM ) injection 5 mg (5 mg Intravenous Given 09/30/23 1503)  technetium albumin  aggregated (MAA) injection solution 4.05 millicurie (4.05 millicuries Intravenous Contrast Given 09/30/23 1627)                                    Medical Decision Making Amount and/or Complexity of Data Reviewed Radiology: ordered.  Risk Prescription drug management.   This patient is a 78 y.o. female  who presents to the ED for concern of chest pain, shob.   Differential diagnoses prior to evaluation: The emergent differential diagnosis includes, but is not limited to,  asthma exacerbation, COPD exacerbation, acute upper respiratory infection, acute bronchitis, chronic bronchitis, interstitial lung disease, ARDS, PE, pneumonia, atypical ACS, carbon monoxide poisoning, spontaneous pneumothorax, new CHF vs CHF exacerbation, versus other --after her extensive workup yesterday she had no evidence of any acute cardiac disease, however concern was raised regarding potential for PE given elevated D-dimer, but inability to get CTA due to contrast allergy. This is not an exhaustive differential.   Past Medical History / Co-morbidities / Social History: diabetes, asthma, hyperlipidemia, previous NSTEMI, bipolar disorder, CAD  Additional history: Chart reviewed. Pertinent results  include: reviewd labs, imaging from recent previous ED visits, outpatient pulmonology visits  Physical Exam: Physical exam performed. The pertinent findings include: Patient did have some Biphasic wheezing noted, no rhonchi, no focal consolidation, no acute respiratory distress, improved on reassessment.  Vital signs overall Stable throughout her emergency department evaluation.  Lab Tests/Imaging studies: Plain film chest x-ray as well as nuclear medicine perfusion study were normal.  Cardiac monitoring:  EKG obtained and interpreted by myself and attending physician which shows: Normal sinus rhythm, no acute ST-T changes   Medications: I ordered medication including 1 dose of Valium  for anxiety, DuoNeb administered in the ED, will plan to send her home on course of prednisone  for her asthma exacerbation..  I have reviewed the patients home medicines and have made adjustments as needed.   Disposition: After consideration of the diagnostic results and the patients response to treatment, I feel that with clear workup yesterday, and today, I do not see any emergent cause for her chest pain, she does have some asthma exacerbation, discussed that her lung inflammation may be contributing to her chest pain, recommend that she follow-up with her pulmonologist, PCP.   emergency department workup does not suggest an emergent condition requiring admission or immediate intervention beyond what has been performed at this time. The plan is: as above. The patient is safe for discharge and has been instructed to return immediately for worsening symptoms, change in symptoms or any other concerns.  Final diagnoses:  Atypical chest pain  Mild intermittent asthma with acute exacerbation    ED Discharge Orders          Ordered    predniSONE  (DELTASONE ) 20 MG tablet  Daily        09/30/23 1811               Rosan Sherlean DEL, [place] 09/30/23 1817    Cottie Donnice PARAS, MD 10/01/23 1419

## 2023-09-30 NOTE — Discharge Instructions (Signed)
 Please take the entire course of steroids that we prescribed, follow up with your pulmonologist. Use your home inhalers as needed. Please return if you have significantly worsening shortness of breath or any other emergent concerns.

## 2023-10-21 ENCOUNTER — Telehealth

## 2023-10-21 DIAGNOSIS — J4521 Mild intermittent asthma with (acute) exacerbation: Secondary | ICD-10-CM

## 2023-10-21 NOTE — Progress Notes (Deleted)
 @  Patient ID: Nicole [name], female    DOB: 12-08-1945, 78 y.o.   MRN: [id number]  No chief complaint on file.   Referring provider: Nichole Senior, MD  Virtual Visit via Video Note  I connected with Nicole [name] on 10/21/23 at  3:30 PM EDT by a video enabled telemedicine application and verified that I am speaking with the correct person using two identifiers.  Location: Patient: *** Provider: ***   I discussed the limitations of evaluation and management by telemedicine and the availability of in person appointments. The patient expressed understanding and agreed to proceed.  History of Present Illness:    Observations/Objective:   Assessment and Plan:   Follow Up Instructions:    I discussed the assessment and treatment plan with the patient. The patient was provided an opportunity to ask questions and all were answered. The patient agreed with the plan and demonstrated an understanding of the instructions.   The patient was advised to call back or seek an in-person evaluation if the symptoms worsen or if the condition fails to improve as anticipated.  I provided *** minutes of non-face-to-face time during this encounter.   Candis Dandy, PA-C

## 2023-10-22 NOTE — Progress Notes (Signed)
 @  Patient ID: Nicole [name], female    DOB: 12-22-1945, 78 y.o.   MRN: [id number]  Visit cancelled per patient.  Candis Dandy, PA-C 10/22/2023

## 2023-10-27 ENCOUNTER — Ambulatory Visit

## 2023-10-27 VITALS — BP 82/68 | HR 74 | Temp 98.8°F | Ht 61.0 in | Wt 163.4 lb

## 2023-10-27 DIAGNOSIS — I255 Ischemic cardiomyopathy: Secondary | ICD-10-CM | POA: Diagnosis not present

## 2023-10-27 DIAGNOSIS — J45909 Unspecified asthma, uncomplicated: Secondary | ICD-10-CM | POA: Diagnosis not present

## 2023-10-27 DIAGNOSIS — I959 Hypotension, unspecified: Secondary | ICD-10-CM

## 2023-10-27 NOTE — Assessment & Plan Note (Signed)
-   Noted; follows with cardiology.

## 2023-10-27 NOTE — Progress Notes (Signed)
 @Patient  ID: Nicole [name], female    DOB: 02/22/1946, 78 y.o.   MRN: [id number]  Chief Complaint  Patient presents with   Asthma    4 week asthma follow up    Referring provider: Nichole Senior, MD  HPI: Nicole [name] is a 78 y/o female with PMH of asthma, CAD, ischemic cardiomyopathy, GERD, and DMII who presents for follow up.  She was last seen on 09/24/2023 in our office and had complained of dyspnea and chest pain.  It was recommended that she go to the ED, but she initially refused.  She was agreeable to checking a Troponin and D-dimer; when the d-dimer was noted to be elevated, she was further encouraged to go to the ED at that time.  She did end up in the ER and at that time pulmonary embolism was ruled out.  She was treated for asthma exacerbation with high-dose steroids.  She finished a full course of high-dose steroids and oral antibiotics.  She reports that she feels back at her baseline at this point.  She denies fever, chills, chest pain, dyspnea on exertion, night sweats.  She was noted to have low blood pressure in office today.  She also states that she has had some issues with decreased oral intake over the last week or so.  She denies any other associated symptoms.  Blood pressure was retaken and was noted to be 100/64.  TEST/EVENTS :   Allergies  Allergen Reactions   Fish-Derived Products Anaphylaxis   Shellfish Allergy Shortness Of Breath    In dye for scan.   Aspirin  Nausea Only and Other (See Comments)    Pt stated it only gives her problems when she takes the high dose (325 mg) GI Intolerance,   Benadryl  [Diphenhydramine ] Other (See Comments)    flips out with it. Eyes roll to the back of her head.   Ivp Dye [Iodinated Contrast Media] Other (See Comments)    Hx of ? anaphlaxis   Latex Other (See Comments)    Pt says she gets a stinky infection    Immunization History  Administered Date(s) Administered   Fluzone Influenza virus vaccine,trivalent  (IIV3), split virus 02/05/2011, 03/19/2012   Influenza, High Dose Seasonal PF 02/08/2022   Influenza, Quadrivalent, Recombinant, Inj, Pf 02/23/2020, 01/30/2021   Moderna Sars-Covid-2 Vaccination 11/23/2019   Pneumococcal Polysaccharide-23 06/21/2011   Td (Adult),5 Lf Tetanus Toxid, Preservative Free 06/21/2011    Past Medical History:  Diagnosis Date   Anemia    Anxiety    Back pain    CAD (coronary artery disease) 10/22/2019   DES to OM1 August 2021   Colon polyps    COVID-28 June 2019   Endometriosis    Fibromyalgia    GERD with esophagitis    Follows with Dr. Aneita with GI, EGD in the past   IBS (irritable bowel syndrome)    Major depressive disorder    Migraine    Mild intermittent asthma    Mixed diabetic hyperlipidemia associated with type 2 diabetes mellitus (HCC)    Type 2 diabetes mellitus (HCC)    Urinary incontinence     Tobacco History: Social History   Tobacco Use  Smoking Status Never   Passive exposure: Past  Smokeless Tobacco Never   Counseling given: Not Answered   Outpatient Medications Prior to Visit  Medication Sig Dispense Refill   albuterol  (PROVENTIL ) (5 MG/ML) 0.5% nebulizer solution Take 0.5 mLs (2.5 mg total) by nebulization every  6 (six) hours as needed for wheezing or shortness of breath. 20 mL 0   albuterol  (VENTOLIN  HFA) 108 (90 Base) MCG/ACT inhaler Inhale 2 puffs into the lungs every 4 (four) hours as needed for wheezing or shortness of breath.     amoxicillin -clavulanate (AUGMENTIN ) 875-125 MG tablet Take 1 tablet by mouth 2 (two) times daily. 14 tablet 0   aspirin  81 MG tablet Take 81 mg by mouth daily.     atorvastatin  (LIPITOR ) 80 MG tablet TAKE 1 TABLET BY MOUTH AT BEDTIME 90 tablet 3   DULoxetine  (CYMBALTA ) 30 MG capsule Take 30 mg by mouth daily.     DULoxetine  (CYMBALTA ) 60 MG capsule Take 60 mg by mouth daily.     ergocalciferol  (VITAMIN D2) 50000 UNITS capsule Take 50,000 Units by mouth once a week.     estradiol  (ESTRACE) 0.1 MG/GM vaginal cream Place 1 Applicatorful vaginally as needed.     fluticasone  (FLONASE ) 50 MCG/ACT nasal spray Place 1 spray into both nostrils daily as needed for allergies or rhinitis.      Fluticasone -Salmeterol (ADVAIR) 250-50 MCG/DOSE AEPB Inhale 1 puff into the lungs 2 (two) times daily.     furosemide  (LASIX ) 20 MG tablet Take 1 tablet (20 mg total) by mouth daily. 90 tablet 3   ipratropium-albuterol  (DUONEB) 0.5-2.5 (3) MG/3ML SOLN Use 1 vial by nebulization 2 (two) times daily. Use twice a day scheduled 360 mL 0   JANUMET  50-500 MG tablet Take 1 tablet by mouth daily.     lidocaine  (LIDODERM ) 5 % 1 patch daily.     loratadine  (CLARITIN ) 10 MG tablet Take 1 tablet (10 mg total) by mouth daily. 30 tablet 0   LORazepam  (ATIVAN ) 1 MG tablet Take 1 tablet (1 mg total) by mouth 2 (two) times daily. 3 tablet 0   meclizine  (ANTIVERT ) 25 MG tablet Take 25 mg by mouth daily as needed for dizziness.     metoprolol  succinate (TOPROL -XL) 25 MG 24 hr tablet Take 1 tablet (25 mg total) by mouth daily. 15 tablet 0   omeprazole (PRILOSEC) 20 MG capsule Take 20 mg by mouth 2 (two) times daily before a meal.     ondansetron  (ZOFRAN ) 4 MG tablet Take 1 tablet by mouth daily.     ONETOUCH DELICA LANCETS 33G MISC      ONETOUCH VERIO test strip      oxyCODONE -acetaminophen  (PERCOCET) 10-325 MG tablet Take 1 tablet by mouth 2 (two) times daily as needed for pain. 3 tablet 0   pantoprazole  (PROTONIX ) 40 MG tablet Take 40 mg by mouth 2 (two) times daily.     Potassium Chloride  ER 20 MEQ TBCR Take 1 tablet by mouth daily.     predniSONE  (DELTASONE ) 20 MG tablet Take 2 tablets (40 mg total) by mouth daily. 10 tablet 0   PROAIR  HFA 108 (90 Base) MCG/ACT inhaler Inhale 2 puffs into the lungs every 4 (four) hours as needed for wheezing or shortness of breath. Uses as rescue inhaler Reported on 06/20/2015 6.7 g 0   sacubitril -valsartan  (ENTRESTO ) 24-26 MG Take 1 tablet by mouth 2 (two) times daily. 30  tablet 0   sacubitril -valsartan  (ENTRESTO ) 97-103 MG Take 1 tablet by mouth 2 (two) times daily. 60 tablet 10   spironolactone  (ALDACTONE ) 25 MG tablet Take 1 tablet (25 mg total) by mouth daily. 90 tablet 3   traZODone  (DESYREL ) 50 MG tablet Take 75 mg by mouth at bedtime.     No facility-administered medications prior to  visit.     Review of Systems:   Constitutional:   No  weight loss, night sweats,  Fevers, chills, fatigue, or  lassitude.  + Dizziness at times + decreased oral intake  HEENT:   No headaches,  Difficulty swallowing,  Tooth/dental problems, or  Sore throat,                No sneezing, itching, ear ache, nasal congestion, post nasal drip,   CV:  No chest pain,  Orthopnea, PND, swelling in lower extremities, anasarca, dizziness, palpitations, syncope.   GI  No heartburn, indigestion, abdominal pain, nausea, vomiting, diarrhea, change in bowel habits, loss of appetite, bloody stools.   Resp: No shortness of breath with exertion or at rest.  No excess mucus, no productive cough,  No non-productive cough,  No coughing up of blood.  No change in color of mucus.  No wheezing.  No chest wall deformity  Skin: no rash or lesions.  GU: no dysuria, change in color of urine, no urgency or frequency.  No flank pain, no hematuria   MS:  No joint pain or swelling.  No decreased range of motion.  No back pain.    Physical Exam  BP (!) 82/68   Pulse 74   Temp 98.8 F (37.1 C) (Oral)   Ht 5' 1 (1.549 m)   Wt 163 lb 6.4 oz (74.1 kg)   SpO2 94%   BMI 30.87 kg/m   BP retaken 100/64  GEN: A/Ox3; pleasant , NAD, well nourished    HEENT:  /AT,  EACs-clear, TMs-wnl, NOSE-clear, THROAT-clear, no lesions, no postnasal drip or exudate noted.   NECK:  Supple w/ fair ROM; no JVD; normal carotid impulses w/o bruits; no thyromegaly or nodules palpated; no lymphadenopathy.    RESP  Clear  P & A; w/o, wheezes/ rales/ or rhonchi. no accessory muscle use, no dullness to  percussion  CARD:  RRR, no m/r/g, no peripheral edema, pulses intact, no cyanosis or clubbing.  GI:   Soft & nt; nml bowel sounds; no organomegaly or masses detected.   Musco: Warm bil, no deformities or joint swelling noted.   Neuro: alert, no focal deficits noted.    Skin: Warm, no lesions or rashes    Lab Results:  CBC    Component Value Date/Time   WBC 10.5 09/29/2023 1259   RBC 3.82 (L) 09/29/2023 1259   HGB 11.4 (L) 09/29/2023 1259   HCT 34.2 (L) 09/29/2023 1259   PLT 310 09/29/2023 1259   MCV 89.5 09/29/2023 1259   MCH 29.8 09/29/2023 1259   MCHC 33.3 09/29/2023 1259   RDW 12.8 09/29/2023 1259   LYMPHSABS 1.6 12/21/2022 1154   MONOABS 0.4 12/21/2022 1154   EOSABS 0.2 12/21/2022 1154   BASOSABS 0.0 12/21/2022 1154    BMET    Component Value Date/Time   NA 136 09/29/2023 1259   NA 143 08/01/2021 1532   K 3.9 09/29/2023 1259   CL 107 09/29/2023 1259   CO2 20 (L) 09/29/2023 1259   GLUCOSE 151 (H) 09/29/2023 1259   BUN 20 09/29/2023 1259   BUN 9 08/01/2021 1532   CREATININE 1.20 (H) 09/29/2023 1259   CALCIUM  9.9 09/29/2023 1259   GFRNONAA 47 (L) 09/29/2023 1259   GFRAA 58 (L) 04/17/2020 1600    BNP    Component Value Date/Time   BNP 25.0 12/21/2022 1154    ProBNP No results found for: PROBNP  Imaging: NM Pulmonary Perfusion Result Date: 09/30/2023 CLINICAL  DATA:  Chest pain EXAM: NUCLEAR MEDICINE PERFUSION LUNG SCAN TECHNIQUE: Perfusion images were obtained in multiple projections after intravenous injection of radiopharmaceutical. RADIOPHARMACEUTICALS:  4.6 mCi Tc-46m MAA IV COMPARISON:  Chest x-ray 09/30/2023 FINDINGS: No significant wedge-shaped perfusion defects are visualized. IMPRESSION: Negative examination Electronically Signed   By: Luke Bun M.D.   On: 09/30/2023 17:46   DG Chest Portable 1 View Result Date: 09/30/2023 CLINICAL DATA:  Chest pain. EXAM: PORTABLE CHEST 1 VIEW COMPARISON:  Chest radiograph dated 09/29/2023. FINDINGS:  Left lung base atelectasis. No focal consolidation, pleural effusion, or pneumothorax. Stable cardiac silhouette. Moderate hiatal hernia. No acute osseous pathology. IMPRESSION: 1. No active disease. 2. Moderate hiatal hernia. Electronically Signed   By: Vanetta Chou M.D.   On: 09/30/2023 14:34   DG Chest 1 View Result Date: 09/29/2023 CLINICAL DATA:  Chest pain, shortness of breath for 3 weeks. EXAM: CHEST  1 VIEW COMPARISON:  12/21/2022 FINDINGS: Moderate to large hiatal hernia. Atherosclerotic calcification of the aortic arch. Heart size within normal limits. The lungs appear otherwise clear.  Mild thoracic spondylosis. IMPRESSION: 1. Moderate to large hiatal hernia. 2. Aortic Atherosclerosis (ICD10-I70.0). Electronically Signed   By: Ryan Salvage M.D.   On: 09/29/2023 13:53    Administration History     None          Latest Ref Rng & Units 10/03/2021    3:06 PM  PFT Results  FVC-Pre L 1.76   FVC-Predicted Pre % 69   FVC-Post L 1.79   FVC-Predicted Post % 70   Pre FEV1/FVC % % 71   Post FEV1/FCV % % 74   FEV1-Pre L 1.24   FEV1-Predicted Pre % 65   FEV1-Post L 1.32   DLCO uncorrected ml/min/mmHg 16.20   DLCO UNC% % 93   DLCO corrected ml/min/mmHg 16.20   DLCO COR %Predicted % 93   DLVA Predicted % 118   TLC L 4.32   TLC % Predicted % 93   RV % Predicted % 104     No results found for: NITRICOXIDE   Assessment & Plan:   Assessment & Plan Uncomplicated asthma, unspecified asthma severity, unspecified whether persistent - Exacerbation appears to have resolved. - Continue Advair twice daily - Continue albuterol  every 4-6 hours as needed - Consider follow-up PFTs at next visit; last PFTs completed in July 2023  Ischemic cardiomyopathy - Noted; follows with cardiology. Hypotension, unspecified hypotension type - Likely due to decreased oral intake in the setting of multiple cardiac meds and chronic heart failure -  Oral intake encouraged; retake in office  showed improved blood pressure -  Follow-up planned with primary care this Wednesday; patient encouraged to keep this appointment and to call sooner if dizziness -  Patient encouraged to follow blood pressure at home and call PCP if remains low   Return in about 6 months (around 04/28/2024).  Candis Dandy, PA-C 10/27/2023

## 2023-10-27 NOTE — Patient Instructions (Signed)
 Continue Advair twice a day.  Use Albuterol  every 4-6 hours as needed for shortness of breath.  Follow up in 6 months.  Return to clinic sooner if new or worsening symptoms.  Fluid intake encouraged; encouraged to monitor blood pressure at home and keep follow up with Primary care as scheduled this Wednesday.

## 2023-10-27 NOTE — Assessment & Plan Note (Signed)
-   Exacerbation appears to have resolved. - Continue Advair twice daily - Continue albuterol  every 4-6 hours as needed - Consider follow-up PFTs at next visit; last PFTs completed in July 2023

## 2023-11-17 ENCOUNTER — Ambulatory Visit: Admitting: Physician Assistant

## 2023-11-18 ENCOUNTER — Other Ambulatory Visit: Payer: Self-pay | Admitting: Cardiology

## 2023-11-18 ENCOUNTER — Encounter: Payer: Self-pay | Admitting: Gastroenterology

## 2023-11-18 ENCOUNTER — Ambulatory Visit: Admitting: Gastroenterology

## 2023-11-18 VITALS — BP 110/60 | HR 76 | Ht 62.25 in | Wt 162.5 lb

## 2023-11-18 DIAGNOSIS — Z860100 Personal history of colon polyps, unspecified: Secondary | ICD-10-CM

## 2023-11-18 DIAGNOSIS — Z860101 Personal history of adenomatous and serrated colon polyps: Secondary | ICD-10-CM | POA: Diagnosis not present

## 2023-11-18 DIAGNOSIS — R109 Unspecified abdominal pain: Secondary | ICD-10-CM

## 2023-11-18 DIAGNOSIS — K219 Gastro-esophageal reflux disease without esophagitis: Secondary | ICD-10-CM | POA: Diagnosis not present

## 2023-11-18 DIAGNOSIS — R194 Change in bowel habit: Secondary | ICD-10-CM | POA: Diagnosis not present

## 2023-11-18 MED ORDER — LANSOPRAZOLE 30 MG PO CPDR: 30.0000 mg | DELAYED_RELEASE_CAPSULE | Freq: Two times a day (BID) | ORAL | 3 refills | Status: DC

## 2023-11-18 MED ORDER — NA SULFATE-K SULFATE-MG SULF 17.5-3.13-1.6 GM/177ML PO SOLN: 1.0000 | Freq: Once | ORAL | 0 refills | Status: AC

## 2023-11-18 NOTE — Patient Instructions (Addendum)
 Recommend GERD diet Switched to Lansoprazole  twice daily, see if this works better than omeprazole   We have sent the following medications to your pharmacy for you to pick up at your convenience: SUPREP  You have been scheduled for an endoscopy and colonoscopy. Please follow the written instructions given to you at your visit today.  If you use inhalers (even only as needed), please bring them with you on the day of your procedure.  DO NOT TAKE 7 DAYS PRIOR TO TEST- Trulicity (dulaglutide) Ozempic, Wegovy (semaglutide) Mounjaro (tirzepatide) Bydureon Bcise (exanatide extended release)  DO NOT TAKE 1 DAY PRIOR TO YOUR TEST Rybelsus (semaglutide) Adlyxin (lixisenatide) Victoza (liraglutide) Byetta (exanatide) ___________________________________________________________________________  Due to recent changes in healthcare laws, you may see the results of your imaging and laboratory studies on MyChart before your provider has had a chance to review them.  We understand that in some cases there may be results that are confusing or concerning to you. Not all laboratory results come back in the same time frame and the provider may be waiting for multiple results in order to interpret others.  Please give us  48 hours in order for your provider to thoroughly review all the results before contacting the office for clarification of your results.   _______________________________________________________  If your blood pressure at your visit was 140/90 or greater, please contact your primary care physician to follow up on this.  _______________________________________________________  If you are age 76 or older, your body mass index should be between 23-30. Your Body mass index is 29.48 kg/m. If this is out of the aforementioned range listed, please consider follow up with your Primary Care Provider.  If you are age 76 or younger, your body mass index should be between 19-25. Your Body mass index  is 29.48 kg/m. If this is out of the aformentioned range listed, please consider follow up with your Primary Care Provider.   ________________________________________________________  The [place] [facility] providers would like to encourage you to use MYCHART to communicate with providers for non-urgent requests or questions.  Due to long hold times on the telephone, sending your provider a message by [facility] may be a faster and more efficient way to get a response.  Please allow 48 business hours for a response.  Please remember that this is for non-urgent requests.  _______________________________________________________  [facility] is using a team-based approach to care.  Your team is made up of your doctor and two to three APPS. Our APPS (Nurse Practitioners and Physician Assistants) work with your physician to ensure care continuity for you. They are fully qualified to address your health concerns and develop a treatment plan. They communicate directly with your gastroenterologist to care for you. Seeing the Advanced Practice Practitioners on your physician's team can help you by facilitating care more promptly, often allowing for earlier appointments, access to diagnostic testing, procedures, and other specialty referrals.   Thank you for trusting me with your gastrointestinal care. Deanna May, FNP-C

## 2023-11-18 NOTE — Progress Notes (Signed)
 Chief Complaint: Watery BM, stomach pain Primary GI Doctor: Previously Dr. Aneita (Dr. Stacia)  HPI:  Patient is a 78 year old female patient with past medical history of CAD, anxiety, GERD, IBS, history of tubular adenomas, type 2 diabetes, and CHF, who was self referred to me for a evaluation of diarrhea, abd pain .    10/08/2023 patient seen by cardiology for follow-up CHF.  Reviewed entire note.  Interval History    Patient presents for evaluation of diarrhea and abdominal pain she has had for past 1.5 years. She reports she has history of IBS-C. No new medications. She has bowel movements that are pure liquid that wakes her up 2-3 times a night and then during the day she will go another 2-3 times.  She will have swelling and upper abdominal pain that pushes upwards causing pain.  Patient has history of GERD and taking omeprazole 20 mg po twice daily. Her symptoms are not controlled and she has heartburn several days a week and typically occurs after supper.  She has also tried pantoprazole  which did not help.  No dysphagia.  She also has chronic nausea and poor appetite. She reports always feeling weak. She has vertigo and a lot of dizziness.   No alcohol use. Nonsmoker  No blood thinners.  Surgical history: laparoscopic chole   Patient's family history includes: Lung CA oldest brother, larynx CA younger brother  Patient uses oxygen  at bedtime prn.   Wt Readings from Last 3 Encounters:  11/18/23 162 lb 8 oz (73.7 kg)  10/27/23 163 lb 6.4 oz (74.1 kg)  09/30/23 160 lb (72.6 kg)     Past Medical History:  Diagnosis Date   Anemia    Anxiety    Arthritis    Asthma    Back pain    CAD (coronary artery disease) 10/22/2019   DES to OM1 August 2021   Colon polyps    COVID-28 June 2019   Depression    Endometriosis    Fibromyalgia    Gallstones    GERD with esophagitis    Follows with Dr. Aneita with GI, EGD in the past   HTN (hypertension)    IBS (irritable  bowel syndrome)    Major depressive disorder    Migraine    Mild intermittent asthma    Mixed diabetic hyperlipidemia associated with type 2 diabetes mellitus (HCC)    Pneumonia    Type 2 diabetes mellitus (HCC)    Urinary incontinence     Past Surgical History:  Procedure Laterality Date   ABDOMINAL HYSTERECTOMY     TAH BSO   APPENDECTOMY  03/11/1997   CATARACT EXTRACTION Bilateral    CHOLECYSTECTOMY     CORONARY STENT INTERVENTION N/A 10/21/2019   Procedure: CORONARY STENT INTERVENTION;  Surgeon: Burnard Debby LABOR, MD;  Location: [facility];  Service: Cardiovascular;  Laterality: N/A;   DENTAL SURGERY     HERNIA REPAIR  03/11/1998   LAPAROSCOPIC ENDOMETRIOSIS FULGURATION     RIGHT/LEFT HEART CATH AND CORONARY ANGIOGRAPHY N/A 10/21/2019   Procedure: RIGHT/LEFT HEART CATH AND CORONARY ANGIOGRAPHY;  Surgeon: Burnard Debby LABOR, MD;  Location: [facility];  Service: Cardiovascular;  Laterality: N/A;    Current Outpatient Medications  Medication Sig Dispense Refill   albuterol  (PROVENTIL ) (5 MG/ML) 0.5% nebulizer solution Take 0.5 mLs (2.5 mg total) by nebulization every 6 (six) hours as needed for wheezing or shortness of breath. 20 mL 0   albuterol  (VENTOLIN   HFA) 108 (90 Base) MCG/ACT inhaler Inhale 2 puffs into the lungs every 4 (four) hours as needed for wheezing or shortness of breath.     aspirin  81 MG tablet Take 81 mg by mouth daily.     atorvastatin  (LIPITOR ) 80 MG tablet TAKE 1 TABLET BY MOUTH AT BEDTIME 90 tablet 3   DULoxetine  (CYMBALTA ) 30 MG capsule Take 30 mg by mouth daily.     DULoxetine  (CYMBALTA ) 60 MG capsule Take 60 mg by mouth daily.     ergocalciferol  (VITAMIN D2) 50000 UNITS capsule Take 50,000 Units by mouth once a week.     estradiol (ESTRACE) 0.1 MG/GM vaginal cream Place 1 Applicatorful vaginally as needed.     fluticasone  (FLONASE ) 50 MCG/ACT nasal spray Place 1 spray into both nostrils daily as needed for allergies or rhinitis.       Fluticasone -Salmeterol (ADVAIR) 250-50 MCG/DOSE AEPB Inhale 1 puff into the lungs 2 (two) times daily.     Fluticasone -Umeclidin-Vilant (TRELEGY ELLIPTA) 200-62.5-25 MCG/ACT AEPB Inhale 1 puff into the lungs as needed.     furosemide  (LASIX ) 20 MG tablet Take 1 tablet (20 mg total) by mouth daily. 90 tablet 3   ipratropium-albuterol  (DUONEB) 0.5-2.5 (3) MG/3ML SOLN Use 1 vial by nebulization 2 (two) times daily. Use twice a day scheduled 360 mL 0   JANUMET  50-500 MG tablet Take 1 tablet by mouth daily.     lansoprazole  (PREVACID ) 30 MG capsule Take 1 capsule (30 mg total) by mouth 2 (two) times daily before a meal. 60 capsule 3   lidocaine  (LIDODERM ) 5 % 1 patch daily.     loratadine  (CLARITIN ) 10 MG tablet Take 1 tablet (10 mg total) by mouth daily. 30 tablet 0   LORazepam  (ATIVAN ) 1 MG tablet Take 1 tablet (1 mg total) by mouth 2 (two) times daily. 3 tablet 0   meclizine  (ANTIVERT ) 25 MG tablet Take 25 mg by mouth daily as needed for dizziness.     metoprolol  succinate (TOPROL -XL) 25 MG 24 hr tablet Take 1 tablet (25 mg total) by mouth daily. 15 tablet 0   montelukast (SINGULAIR) 10 MG tablet Take 10 mg by mouth at bedtime.     Na Sulfate-K Sulfate-Mg Sulfate concentrate (SUPREP) 17.5-3.13-1.6 GM/177ML SOLN Take 1 kit (354 mLs total) by mouth once for 1 dose. 354 mL 0   omeprazole (PRILOSEC) 20 MG capsule Take 20 mg by mouth 2 (two) times daily before a meal.     ondansetron  (ZOFRAN ) 4 MG tablet Take 1 tablet by mouth daily.     ONETOUCH DELICA LANCETS 33G MISC      ONETOUCH VERIO test strip      oxyCODONE -acetaminophen  (PERCOCET) 10-325 MG tablet Take 1 tablet by mouth 2 (two) times daily as needed for pain. 3 tablet 0   pantoprazole  (PROTONIX ) 40 MG tablet Take 40 mg by mouth 2 (two) times daily.     Potassium Chloride  ER 20 MEQ TBCR Take 1 tablet by mouth daily.     predniSONE  (DELTASONE ) 20 MG tablet Take 2 tablets (40 mg total) by mouth daily. 10 tablet 0   PROAIR  HFA 108 (90 Base)  MCG/ACT inhaler Inhale 2 puffs into the lungs every 4 (four) hours as needed for wheezing or shortness of breath. Uses as rescue inhaler Reported on 06/20/2015 6.7 g 0   promethazine  (PHENERGAN ) 25 MG tablet Take 25 mg by mouth as needed.     sacubitril -valsartan  (ENTRESTO ) 24-26 MG Take 1 tablet by mouth 2 (two) times  daily. 30 tablet 0   sacubitril -valsartan  (ENTRESTO ) 97-103 MG Take 1 tablet by mouth 2 (two) times daily. 60 tablet 10   spironolactone  (ALDACTONE ) 25 MG tablet Take 1 tablet (25 mg total) by mouth daily. 90 tablet 3   SYMBICORT 160-4.5 MCG/ACT inhaler Inhale 2 puffs into the lungs daily.     traZODone  (DESYREL ) 50 MG tablet Take 75 mg by mouth at bedtime.     No current facility-administered medications for this visit.    Allergies as of 11/18/2023 - Review Complete 11/18/2023  Allergen Reaction Noted   Benadryl  [diphenhydramine ] Other (See Comments) 01/10/2020   Fish-derived products Anaphylaxis 01/18/2014   Shellfish allergy Shortness Of Breath 05/07/2011   Aspirin  Nausea Only and Other (See Comments) 01/18/2014   Ivp dye [iodinated contrast media] Other (See Comments) 10/21/2019   Latex Other (See Comments) 10/16/2019    Family History  Problem Relation Age of Onset   Hypertension Mother    Heart failure Mother    Diabetes Mother    Hypertension Father    Heart failure Father    Diabetes Father    Heart attack Father    Stroke Father    Lung cancer Brother    Cancer Brother        laryngeal   Other Brother        died at birth blue baby   Other Brother        died at birth   Hypertension Maternal Grandmother    Hypertension Maternal Grandfather    Hypertension Paternal Grandmother    Hypertension Paternal Grandfather    Diabetes Daughter    Heart attack Daughter    Bone cancer Paternal Aunt    Lung cancer Paternal Uncle    Colon cancer Neg Hx    Stomach cancer Neg Hx     Review of Systems:    Constitutional: No weight loss, fever, chills, weakness  or fatigue HEENT: Eyes: No change in vision               Ears, Nose, Throat:  No change in hearing or congestion Skin: No rash or itching Cardiovascular: No chest pain, chest pressure or palpitations   Respiratory: No SOB or cough Gastrointestinal: See HPI and otherwise negative Genitourinary: No dysuria or change in urinary frequency Neurological: No headache, dizziness or syncope Musculoskeletal: No new muscle or joint pain Hematologic: No bleeding or bruising Psychiatric: No history of depression or anxiety    Physical Exam:  Vital signs: BP 110/60 (BP Location: Left Arm, Patient Position: Sitting, Cuff Size: Normal)   Pulse 76   Ht 5' 2.25 (1.581 m) Comment: height measured without shoes  Wt 162 lb 8 oz (73.7 kg)   BMI 29.48 kg/m   Constitutional:   Pleasant female appears to be in NAD, Well developed, Well nourished, alert and cooperative Throat: Oral cavity and pharynx without inflammation, swelling or lesion.  Respiratory: Respirations even and unlabored. Lungs clear to auscultation bilaterally.   No wheezes, crackles, or rhonchi.  Cardiovascular: Normal S1, S2. Regular rate and rhythm. No peripheral edema, cyanosis or pallor.  Gastrointestinal:  Soft, nondistended, nontender. No rebound or guarding. Normal bowel sounds. No appreciable masses or hepatomegaly. Rectal:  Not performed.  Msk:  Symmetrical without gross deformities. Without edema, no deformity or joint abnormality.  Neurologic:  Alert and  oriented x4;  grossly normal neurologically.  Skin:   Dry and intact without significant lesions or rashes.  RELEVANT LABS AND IMAGING: CBC    Latest  Ref Rng & Units 09/29/2023   12:59 PM 12/21/2022   11:54 AM 03/16/2021    2:01 AM  CBC  WBC 4.0 - 10.5 K/uL 10.5  10.4  8.0   Hemoglobin 12.0 - 15.0 g/dL 88.5  87.4  89.1   Hematocrit 36.0 - 46.0 % 34.2  38.6  33.4   Platelets 150 - 400 K/uL 310  255  282      CMP     Latest Ref Rng & Units 09/29/2023   12:59 PM  12/21/2022   11:54 AM 08/01/2021    3:32 PM  CMP  Glucose 70 - 99 mg/dL 848  852  769   BUN 8 - 23 mg/dL 20  26  9    Creatinine 0.44 - 1.00 mg/dL 8.79  8.99  9.08   Sodium 135 - 145 mmol/L 136  137  143   Potassium 3.5 - 5.1 mmol/L 3.9  4.4  3.9   Chloride 98 - 111 mmol/L 107  105  105   CO2 22 - 32 mmol/L 20  23  23    Calcium  8.9 - 10.3 mg/dL 9.9  9.7  9.6   Total Protein 6.5 - 8.1 g/dL  7.2    Total Bilirubin 0.3 - 1.2 mg/dL  0.7    Alkaline Phos 38 - 126 U/L  110    AST 15 - 41 U/L  18    ALT 0 - 44 U/L  21       Lab Results  Component Value Date   TSH 1.19 03/26/2021  6/22 echo-Left ventricular ejection fraction, by estimation, is 60 to 65%.   GI procedures  07/31/2011 colonoscopy with Dr. Aneita, recall 3 years 4 to 6 mm 2 polyps in ascending colon 4 to 10 mm 2 polyps in the hepatic flexure 4 to 5 mm 2 polyps in the descending colon Internal hemorrhoids Path: Diagnosis 1. Surgical [P], ascending (2), hepatic flexure (2), polyps - TUBULAR ADENOMAS (MULTIPLE FRAGMENTS). - HIGH GRADE DYSPLASIA IS NOT IDENTIFIED. 2. Surgical [P], descending polyp - TUBULAR ADENOMA. - HIGH GRADE DYSPLASIA IS NOT IDENTIFIED  03/06/2006 colonoscopy Normal exam hepatic flexure to splenic flexure Polyp in ascending colon maximum size 11 mm sessile polyp Ascending polyp maximum size 3 mm sessile polyp Descending colon polyp maximum size 3 mm sessile polyp Descending colon maximum size 3 mm sessile polyp Normal exam sigmoid colon Hemorrhoids internal/small nonbleeding non thrombosed Path:  1. COLON, ASCENDING POLYPS: TUBULAR ADENOMAS. NO HIGH GRADE   DYSPLASIA OR MALIGNANCY IDENTIFIED.    2. COLON, DESCENDING POLYP: TUBULAR ADENOMA. NO HIGH GRADE   DYSPLASIA OR MALIGNANCY IDENTIFIED.    02/2006 EGD Normal proximal esophagus to distal esophagus Z-line at 34 cm Hiatal hernia irregular 5 cm in length Polyp in cardia maximum size 3 mm sessile polyp removed Mucosal abnormality fundus to  pyloric sphincter erythematous mucosa granular mucosa biopsy taken Nodule maximum size 3 mm in body Nodular maximum size 4 mm mucosal nodule in atrium biopsy Normal duodenal bulb to duodenal second portion Path:  3. GASTRIC NODULES, BIOPSIES: INFLAMED AND HYPERPLASTIC GASTRIC   MUCOSA. NO HELICOBACTER PYLORI, DYSPLASIA OR CARCINOMA   IDENTIFIED.    4. GASTRIC CARDIA POLYP: HYPERPLASTIC GASTRIC POLYP WITH FOCAL   INFLAMMATION. NO HELICOBACTER PYLORI, ADENOMATOUS CHANGE OR   CARCINOMA IDENTIFIED.   Assessment: Encounter Diagnoses  Name Primary?   Altered bowel habits Yes   History of colonic polyps    Gastroesophageal reflux disease, unspecified whether esophagitis present    Abdominal  discomfort      78 year old female patient with history of multiple tubular adenomas that is way overdue for colon screening surveillance who presents with altered bowel habit changes that consist of diarrhea and urgency.  Due to her history of constipation we will go ahead and get two-view abdominal x-ray to rule out obstipation. History of IBS-C.  Will also schedule colonoscopy in hospital with Dr. Stacia.     Patient also complains of uncontrolled GERD on PPI therapy twice daily.  Patient has tried and failed omeprazole and pantoprazole .  Will go ahead and switch patient to lansoprazole  twice daily along with a strict GERD diet.  Will also schedule upper GI endoscopy to evaluate for esophagitis and/or Barrett's.  Plan: -- switch to lansoprazole  30 mg twice daily -- Reinforced GERD diet, no late meals -- Order abdominal xray 2 view --Schedule for a colonoscopy in hospital (on oxygen ) with Dr. Stacia. The risks and benefits of colonoscopy with possible polypectomy / biopsies were discussed and the patient agrees to proceed.  -- Schedule in hospital (on oxygen ) with Dr. Stacia. The risks and benefits of EGD with possible biopsies and esophageal dilation were discussed with the patient who agrees  to proceed.  Thank you for the courtesy of this consult. Please call me with any questions or concerns.   Iisha Soyars, FNP-C [facility] 11/18/2023, 4:50 PM  Cc: Nichole Senior, MD

## 2023-11-19 ENCOUNTER — Ambulatory Visit (INDEPENDENT_AMBULATORY_CARE_PROVIDER_SITE_OTHER)
Admission: RE | Admit: 2023-11-19 | Discharge: 2023-11-19 | Disposition: A | Discharge status: Home / Self Care | Source: Ambulatory Visit | Attending: Gastroenterology

## 2023-11-19 DIAGNOSIS — Z860100 Personal history of colon polyps, unspecified: Secondary | ICD-10-CM | POA: Diagnosis not present

## 2023-11-19 DIAGNOSIS — R194 Change in bowel habit: Secondary | ICD-10-CM

## 2023-11-19 DIAGNOSIS — K219 Gastro-esophageal reflux disease without esophagitis: Secondary | ICD-10-CM | POA: Diagnosis not present

## 2023-11-23 NOTE — Progress Notes (Signed)
 Agree with the assessment and plan as outlined by [facility], FNP-C.  Would also consider trial of bile acid binder to help with diarrhea, given history of cholecystectomy.   Rani Sisney E. Stacia, MD [facility]

## 2023-11-25 ENCOUNTER — Ambulatory Visit: Payer: Self-pay | Admitting: Gastroenterology

## 2023-12-20 ENCOUNTER — Emergency Department (HOSPITAL_COMMUNITY)

## 2023-12-20 ENCOUNTER — Inpatient Hospital Stay (HOSPITAL_COMMUNITY)
Admission: EM | Admit: 2023-12-20 | Discharge: 2024-01-10 | DRG: 215 | Disposition: E | Attending: Cardiology | Admitting: Cardiology

## 2023-12-20 ENCOUNTER — Other Ambulatory Visit: Payer: Self-pay

## 2023-12-20 DIAGNOSIS — Z79899 Other long term (current) drug therapy: Secondary | ICD-10-CM

## 2023-12-20 DIAGNOSIS — I2119 ST elevation (STEMI) myocardial infarction involving other coronary artery of inferior wall: Secondary | ICD-10-CM | POA: Diagnosis not present

## 2023-12-20 DIAGNOSIS — Z9049 Acquired absence of other specified parts of digestive tract: Secondary | ICD-10-CM

## 2023-12-20 DIAGNOSIS — J9601 Acute respiratory failure with hypoxia: Secondary | ICD-10-CM | POA: Diagnosis present

## 2023-12-20 DIAGNOSIS — J4520 Mild intermittent asthma, uncomplicated: Secondary | ICD-10-CM | POA: Diagnosis present

## 2023-12-20 DIAGNOSIS — Z823 Family history of stroke: Secondary | ICD-10-CM

## 2023-12-20 DIAGNOSIS — Z8616 Personal history of COVID-19: Secondary | ICD-10-CM

## 2023-12-20 DIAGNOSIS — I493 Ventricular premature depolarization: Secondary | ICD-10-CM | POA: Diagnosis present

## 2023-12-20 DIAGNOSIS — Z9842 Cataract extraction status, left eye: Secondary | ICD-10-CM

## 2023-12-20 DIAGNOSIS — M797 Fibromyalgia: Secondary | ICD-10-CM | POA: Diagnosis present

## 2023-12-20 DIAGNOSIS — I213 ST elevation (STEMI) myocardial infarction of unspecified site: Secondary | ICD-10-CM | POA: Diagnosis not present

## 2023-12-20 DIAGNOSIS — D65 Disseminated intravascular coagulation [defibrination syndrome]: Secondary | ICD-10-CM | POA: Diagnosis present

## 2023-12-20 DIAGNOSIS — N170 Acute kidney failure with tubular necrosis: Secondary | ICD-10-CM | POA: Diagnosis present

## 2023-12-20 DIAGNOSIS — Z7982 Long term (current) use of aspirin: Secondary | ICD-10-CM

## 2023-12-20 DIAGNOSIS — I5181 Takotsubo syndrome: Secondary | ICD-10-CM | POA: Diagnosis present

## 2023-12-20 DIAGNOSIS — R001 Bradycardia, unspecified: Secondary | ICD-10-CM | POA: Diagnosis present

## 2023-12-20 DIAGNOSIS — R000 Tachycardia, unspecified: Secondary | ICD-10-CM | POA: Diagnosis present

## 2023-12-20 DIAGNOSIS — I462 Cardiac arrest due to underlying cardiac condition: Secondary | ICD-10-CM | POA: Diagnosis present

## 2023-12-20 DIAGNOSIS — E876 Hypokalemia: Secondary | ICD-10-CM | POA: Diagnosis present

## 2023-12-20 DIAGNOSIS — Z66 Do not resuscitate: Secondary | ICD-10-CM | POA: Diagnosis not present

## 2023-12-20 DIAGNOSIS — I4901 Ventricular fibrillation: Secondary | ICD-10-CM | POA: Diagnosis present

## 2023-12-20 DIAGNOSIS — R570 Cardiogenic shock: Secondary | ICD-10-CM | POA: Diagnosis present

## 2023-12-20 DIAGNOSIS — E1169 Type 2 diabetes mellitus with other specified complication: Secondary | ICD-10-CM | POA: Diagnosis present

## 2023-12-20 DIAGNOSIS — Z955 Presence of coronary angioplasty implant and graft: Secondary | ICD-10-CM

## 2023-12-20 DIAGNOSIS — D589 Hereditary hemolytic anemia, unspecified: Secondary | ICD-10-CM | POA: Diagnosis present

## 2023-12-20 DIAGNOSIS — I272 Pulmonary hypertension, unspecified: Secondary | ICD-10-CM | POA: Diagnosis present

## 2023-12-20 DIAGNOSIS — N179 Acute kidney failure, unspecified: Secondary | ICD-10-CM | POA: Diagnosis present

## 2023-12-20 DIAGNOSIS — Z515 Encounter for palliative care: Secondary | ICD-10-CM

## 2023-12-20 DIAGNOSIS — E119 Type 2 diabetes mellitus without complications: Secondary | ICD-10-CM | POA: Diagnosis present

## 2023-12-20 DIAGNOSIS — E872 Acidosis, unspecified: Secondary | ICD-10-CM | POA: Diagnosis present

## 2023-12-20 DIAGNOSIS — G9341 Metabolic encephalopathy: Secondary | ICD-10-CM | POA: Diagnosis present

## 2023-12-20 DIAGNOSIS — I5023 Acute on chronic systolic (congestive) heart failure: Secondary | ICD-10-CM | POA: Diagnosis present

## 2023-12-20 DIAGNOSIS — Z90710 Acquired absence of both cervix and uterus: Secondary | ICD-10-CM

## 2023-12-20 DIAGNOSIS — K7200 Acute and subacute hepatic failure without coma: Secondary | ICD-10-CM | POA: Diagnosis present

## 2023-12-20 DIAGNOSIS — F329 Major depressive disorder, single episode, unspecified: Secondary | ICD-10-CM | POA: Diagnosis present

## 2023-12-20 DIAGNOSIS — Z8249 Family history of ischemic heart disease and other diseases of the circulatory system: Secondary | ICD-10-CM

## 2023-12-20 DIAGNOSIS — F419 Anxiety disorder, unspecified: Secondary | ICD-10-CM | POA: Diagnosis present

## 2023-12-20 DIAGNOSIS — I2510 Atherosclerotic heart disease of native coronary artery without angina pectoris: Secondary | ICD-10-CM | POA: Diagnosis present

## 2023-12-20 DIAGNOSIS — Z9841 Cataract extraction status, right eye: Secondary | ICD-10-CM

## 2023-12-20 DIAGNOSIS — Z7951 Long term (current) use of inhaled steroids: Secondary | ICD-10-CM

## 2023-12-20 DIAGNOSIS — Z91013 Allergy to seafood: Secondary | ICD-10-CM

## 2023-12-20 DIAGNOSIS — Z860100 Personal history of colon polyps, unspecified: Secondary | ICD-10-CM

## 2023-12-20 DIAGNOSIS — E7849 Other hyperlipidemia: Secondary | ICD-10-CM | POA: Diagnosis present

## 2023-12-20 DIAGNOSIS — I110 Hypertensive heart disease with heart failure: Secondary | ICD-10-CM | POA: Diagnosis present

## 2023-12-20 DIAGNOSIS — D72829 Elevated white blood cell count, unspecified: Secondary | ICD-10-CM | POA: Diagnosis present

## 2023-12-20 DIAGNOSIS — Z801 Family history of malignant neoplasm of trachea, bronchus and lung: Secondary | ICD-10-CM

## 2023-12-20 LAB — I-STAT CG4 LACTIC ACID, ED: Lactic Acid, Venous: 8.8 mmol/L (ref 0.5–1.9)

## 2023-12-20 LAB — HEMOGLOBIN A1C
Hgb A1c MFr Bld: 6.1 % — ABNORMAL HIGH (ref 4.8–5.6)
Mean Plasma Glucose: 128.37 mg/dL

## 2023-12-20 LAB — CBC WITH DIFFERENTIAL/PLATELET
Abs Immature Granulocytes: 0.17 K/uL — ABNORMAL HIGH (ref 0.00–0.07)
Basophils Absolute: 0.1 K/uL (ref 0.0–0.1)
Basophils Relative: 0 %
Eosinophils Absolute: 0 K/uL (ref 0.0–0.5)
Eosinophils Relative: 0 %
HCT: 38.7 % (ref 36.0–46.0)
Hemoglobin: 12 g/dL (ref 12.0–15.0)
Immature Granulocytes: 1 %
Lymphocytes Relative: 13 %
Lymphs Abs: 2.4 K/uL (ref 0.7–4.0)
MCH: 29.3 pg (ref 26.0–34.0)
MCHC: 31 g/dL (ref 30.0–36.0)
MCV: 94.4 fL (ref 80.0–100.0)
Monocytes Absolute: 0.9 K/uL (ref 0.1–1.0)
Monocytes Relative: 5 %
Neutro Abs: 15.6 K/uL — ABNORMAL HIGH (ref 1.7–7.7)
Neutrophils Relative %: 81 %
Platelets: 268 K/uL (ref 150–400)
RBC: 4.1 MIL/uL (ref 3.87–5.11)
RDW: 13.2 % (ref 11.5–15.5)
WBC: 19.2 K/uL — ABNORMAL HIGH (ref 4.0–10.5)
nRBC: 0 % (ref 0.0–0.2)

## 2023-12-20 MED ORDER — HEPARIN SODIUM (PORCINE) 5000 UNIT/ML IJ SOLN
4000.0000 [IU] | Freq: Once | INTRAMUSCULAR | Status: AC
  Administered 2023-12-20: 4000 [IU] via INTRAVENOUS

## 2023-12-20 MED ORDER — SODIUM CHLORIDE 0.9 % IV SOLN: INTRAVENOUS | Status: DC

## 2023-12-20 MED ORDER — HEPARIN SODIUM (PORCINE) 5000 UNIT/ML IJ SOLN
INTRAMUSCULAR | Status: AC
  Administered 2023-12-20: 4000 [IU] via INTRAVENOUS
  Filled 2023-12-20: qty 1

## 2023-12-20 NOTE — Progress Notes (Signed)
 Locating second STEMI team for activation. Patient with 4/10 chest discomfort, BP 90s systolic, HR 110s, inferior STE noted.  Lactate 8  Andee Flatten, MD Cards on call

## 2023-12-20 NOTE — ED Triage Notes (Signed)
 PT brought in by [facility] for SOB and stemi call, PT was given 324 of ASA, and mag for SOB. PT in distress when cpap is off. PT plACED ON bipap UPON ARRIVAL TO ed. Pt STATES SOME CHEST PAIN AND IS ALERT AND TALKING.

## 2023-12-20 NOTE — ED Triage Notes (Signed)
 NITRO AND SOUL-MEDROL  GIVEN EN ROUTE

## 2023-12-20 NOTE — ED Provider Notes (Incomplete)
 Emergency Department Provider Note   I have reviewed the triage vital signs and the nursing notes.   HISTORY  Chief Complaint Chest Pain and Shortness of Breath   HPI Nicole [name] is a 78 y.o. female with past history reviewed below presents emergency department with shortness of breath, chest tightness with EMS.  Symptoms began this evening.  She describes 5 out of 10 chest heaviness in the central area of her chest.  She is mainly having shortness of breath symptoms when she called out.  EMS appreciated diminished breath sounds throughout and started her on CPAP.  She was given Solu-Medrol  along with 2 g of magnesium , 324 mg of aspirin , 1 sublingual nitroglycerin .  Breathing symptoms improved and route with CPAP.  Blood pressure remained elevated with systolic pressures in the 170s.  Code STEMI activated from the field.   Past Medical History:  Diagnosis Date   Anemia    Anxiety    Arthritis    Asthma    Back pain    CAD (coronary artery disease) 10/22/2019   DES to OM1 August 2021   Colon polyps    COVID-28 June 2019   Depression    Endometriosis    Fibromyalgia    Gallstones    GERD with esophagitis    Follows with Dr. Aneita with GI, EGD in the past   HTN (hypertension)    IBS (irritable bowel syndrome)    Major depressive disorder    Migraine    Mild intermittent asthma    Mixed diabetic hyperlipidemia associated with type 2 diabetes mellitus (HCC)    Pneumonia    Type 2 diabetes mellitus (HCC)    Urinary incontinence     Review of Systems  Constitutional: No fever/chills Cardiovascular: Positive chest pain. Respiratory: Positive shortness of breath. Gastrointestinal: No abdominal pain.  Positive nausea, no vomiting.  Skin: Negative for rash. Neurological: Negative for headaches.  ____________________________________________   PHYSICAL EXAM:  VITAL SIGNS: ED Triage Vitals  Encounter Vitals Group     BP 12/20/23 2340 (!) 170/78     Pulse  Rate 12/20/23 2340 (!) 122     Resp 12/20/23 2340 (!) 24     Temp 12/20/23 2340 98 F (36.7 C)     Temp src --      SpO2 12/20/23 2340 98 %     Weight 12/20/23 2341 160 lb (72.6 kg)     Height 12/20/23 2341 5' 3 (1.6 m)   Constitutional: Alert and oriented. Ill appearing but able to provide a history.  Eyes: Conjunctivae are normal.  Head: Atraumatic. Nose: No congestion/rhinnorhea. Mouth/Throat: Mucous membranes are moist.   Neck: No stridor.   Cardiovascular: Tachycardia. Good peripheral circulation. Grossly normal heart sounds.   Respiratory: Slight increased respiratory effort.  No retractions. Lungs diminished bilaterally.  Gastrointestinal: Soft and nontender. No distention.  Musculoskeletal: No lower extremity tenderness nor edema. No gross deformities of extremities. Neurologic:  Normal speech and language.  Skin:  Skin is warm, dry and intact. No rash noted.  ____________________________________________   LABS (all labs ordered are listed, but only abnormal results are displayed)  Labs Reviewed  HEMOGLOBIN A1C - Abnormal; Notable for the following components:      Result Value   Hgb A1c MFr Bld 6.1 (*)    All other components within normal limits  CBC WITH DIFFERENTIAL/PLATELET - Abnormal; Notable for the following components:   WBC 19.2 (*)    Neutro Abs 15.6 (*)  Abs Immature Granulocytes 0.17 (*)    All other components within normal limits  PROTIME-INR - Abnormal; Notable for the following components:   Prothrombin Time 16.7 (*)    INR 1.3 (*)    All other components within normal limits  COMPREHENSIVE METABOLIC PANEL WITH GFR - Abnormal; Notable for the following components:   Sodium 133 (*)    CO2 8 (*)    Glucose, Bld 389 (*)    BUN 40 (*)    Creatinine, Ser 3.72 (*)    Calcium  7.7 (*)    Total Protein 6.1 (*)    Albumin  3.1 (*)    AST 70 (*)    GFR, Estimated 12 (*)    Anion gap 24 (*)    All other components within normal limits  LIPID PANEL  - Abnormal; Notable for the following components:   Triglycerides 162 (*)    HDL 36 (*)    All other components within normal limits  I-STAT CG4 LACTIC ACID, ED - Abnormal; Notable for the following components:   Lactic Acid, Venous 8.8 (*)    All other components within normal limits  TROPONIN I (HIGH SENSITIVITY) - Abnormal; Notable for the following components:   Troponin I (High Sensitivity) [id number] (*)    All other components within normal limits  APTT  BLOOD GAS, ARTERIAL   ____________________________________________  EKG   EKG Interpretation Date/Time:  Saturday December 20 2023 23:41:08 EDT Ventricular Rate:  122 PR Interval:  144 QRS Duration:  114 QT Interval:  309 QTC Calculation: 441 R Axis:   75  Text Interpretation: Sinus tachycardia Inferior infarct, acute Probable anterolateral infarct, acute >>> Acute MI <<< Confirmed by Darra Chew [phone]) on 12/20/2023 11:46:29 PM        ____________________________________________  RADIOLOGY  DG Chest Port 1 View Result Date: Jan 12, 2024 CLINICAL DATA:  Post intubation.  Shortness of breath, chest pain EXAM: PORTABLE CHEST 1 VIEW COMPARISON:  09/30/2023 FINDINGS: Endotracheal tube not visualized. Heart and mediastinal contours within normal limits. No confluent airspace opacities or effusions. No acute bony abnormality. IMPRESSION: No active cardiopulmonary disease. Endotracheal tube not visualized. Electronically Signed   By: Franky Crease M.D.   On: 01-12-2024 00:10    ____________________________________________   PROCEDURES  Procedure(s) performed:   Procedure Name: Intubation Date/Time: January 12, 2024 12:57 AM  Performed by: Darra Chew MATSU, MDPre-anesthesia Checklist: Patient identified, Patient being monitored, Emergency Drugs available, Timeout performed and Suction available Oxygen  Delivery Method: Non-rebreather mask Preoxygenation: Pre-oxygenation with 100% oxygen  Induction Type: Rapid  sequence Ventilation: Mask ventilation without difficulty Laryngoscope Size: Glidescope and 3 Grade View: Grade II Tube size: 7.5 mm Number of attempts: 1 Airway Equipment and Method: Video-laryngoscopy Placement Confirmation: ETT inserted through vocal cords under direct vision, CO2 detector and Breath sounds checked- equal and bilateral Secured at: 23 cm Tube secured with: ETT holder Dental Injury: Teeth and Oropharynx as per pre-operative assessment     .Critical Care  Performed by: Darra Chew MATSU, MD Authorized by: Darra Chew MATSU, MD   Critical care provider statement:    Critical care time (minutes):  75   Critical care time was exclusive of:  Separately billable procedures and treating other patients and teaching time   Critical care was necessary to treat or prevent imminent or life-threatening deterioration of the following conditions:  Cardiac failure   Critical care was time spent personally by me on the following activities:  Development of treatment plan with patient or surrogate, discussions with consultants, evaluation  of patient's response to treatment, examination of patient, ordering and review of laboratory studies, ordering and review of radiographic studies, ordering and performing treatments and interventions, pulse oximetry, re-evaluation of patient's condition, review of old charts, ventilator management and obtaining history from patient or surrogate   I assumed direction of critical care for this patient from another provider in my specialty: no     Care discussed with: admitting provider    Cardiopulmonary Resuscitation (CPR) Procedure Note Directed/Performed by: Fonda KANDICE Law I personally directed ancillary staff and/or performed CPR in an effort to regain return of spontaneous circulation and to maintain cardiac, neuro and systemic perfusion.   ____________________________________________   INITIAL IMPRESSION / ASSESSMENT AND PLAN / ED COURSE  Pertinent  labs & imaging results that were available during my care of the patient were reviewed by me and considered in my medical decision making (see chart for details).   This patient is Presenting for Evaluation of CP/SOB, which does require a range of treatment options, and is a complaint that involves a high risk of morbidity and mortality.  The Differential Diagnoses includes but is not exclusive to acute coronary syndrome, aortic dissection, pulmonary embolism, cardiac tamponade, community-acquired pneumonia, pericarditis, musculoskeletal chest wall pain, etc.   Critical Interventions-    Medications  0.9 %  sodium chloride  infusion ( Intravenous New Bag/Given 12/20/23 2343)  0.9 %  sodium chloride  infusion (250 mLs Intravenous New Bag/Given 01-02-2024 0008)  norepinephrine (LEVOPHED) 4mg  in (0.016 mg/mL) premix infusion (4 mcg/min Intravenous Rate/Dose Change 2024-01-02 0015)  morphine  (PF) 2 MG/ML injection 1 mg (has no administration in time range)  etomidate (AMIDATE) injection 20 mg (has no administration in time range)  succinylcholine (ANECTINE) syringe 100 mg (has no administration in time range)  fentaNYL  (SUBLIMAZE ) injection 50 mcg (has no administration in time range)  fentaNYL  (SUBLIMAZE ) injection 50-200 mcg (has no administration in time range)  midazolam  (VERSED ) injection 1-2 mg (has no administration in time range)  phenylephrine 80 mcg/10 mL injection (has no administration in time range)  amiodarone (NEXTERONE) 1.5 mg/mL IV bolus only (has no administration in time range)  heparin  injection 4,000 Units (4,000 Units Intravenous Given 12/20/23 2345)  etomidate (AMIDATE) injection (20 mg Intravenous Given 01/02/2024 0050)  succinylcholine (ANECTINE) injection (100 mg Intravenous Given 2024-01-02 0052)    Reassessment after intervention: CP improving.    I did obtain Additional Historical Information from EMS, as the patient is critically ill.   Clinical Laboratory Tests  Ordered, included lactic acid in the setting of STEMI. Suspect poor perfusion related to AMI; doubt sepsis.  Troponin 18,000.  Leukocytosis likely reactive.  Radiologic Tests Ordered, included CXR. I independently interpreted the images and agree with radiology interpretation.   Cardiac Monitor Tracing which shows tachycardia.    Social Determinants of Health Risk patient is a non-smoker.   Consult complete with Cardiology at bedside for STEMI evaluation.   Medical Decision Making: Summary:  The patient presents the emergency department with chest discomfort and shortness of breath.  Arrives on CPAP which seems to be improving breathing symptoms.  EKG on arrival shows ST elevation in V3/V4 which was seen with EMS but now appears to show evolution of inferior MI.  Heparin  bolus ordered.  Initial blood pressure soft with manual systolic pressure in the 90s.  Cardiology at bedside.   Reevaluation with update and discussion with patient at 12:08 AM. Hypotension not responding to IVF bolus. Starting peripheral NE. Patient is awake/alert. Denies active CP.  12:40 AM  Patient's breathing remains labored.  There is concern that she will not be able to lie flat for the cath.  I discussed this with the patient at bedside and she agrees to move forward with intubation.  This was performed without immediate complication and patient is ready to be received in the Cath Lab.  While discussing intubation with the patient she appeared to go into a much faster rhythm with wider complex.  Cardiology at bedside.  We started amiodarone.  After intubation heart rate returned to the 120 range which appears now to be more sinus.  No cardioversion required.  01:10 AM  Just prior to transport out of the department the patient suddenly developed bradycardia and lost pulses.  Staff at bedside immediately began CPR and administered epinephrine  along with bicarb.  Her levo was turned up.  After 3 rounds of CPR we were able  to achieve ROSC.  Patient transported from the ED.   Patient's presentation is most consistent with acute presentation with potential threat to life or bodily function.   Disposition: admit  ____________________________________________  FINAL CLINICAL IMPRESSION(S) / ED DIAGNOSES  Final diagnoses:  ST elevation myocardial infarction (STEMI), unspecified artery (HCC)     Note:  This document was prepared using Dragon voice recognition software and may include unintentional dictation errors.  Fonda Law, MD, [facility] Emergency Medicine    Lariya Kinzie, Fonda MATSU, MD December 28, 2023 [id number]    Law Fonda MATSU, MD 2023-12-28 0110

## 2023-12-21 ENCOUNTER — Encounter (HOSPITAL_COMMUNITY): Payer: Self-pay | Admitting: Cardiology

## 2023-12-21 ENCOUNTER — Encounter (HOSPITAL_COMMUNITY): Admission: EM | Disposition: E | Payer: Self-pay | Source: Home / Self Care | Attending: Cardiology

## 2023-12-21 DIAGNOSIS — D589 Hereditary hemolytic anemia, unspecified: Secondary | ICD-10-CM | POA: Diagnosis present

## 2023-12-21 DIAGNOSIS — G9341 Metabolic encephalopathy: Secondary | ICD-10-CM | POA: Diagnosis present

## 2023-12-21 DIAGNOSIS — K7200 Acute and subacute hepatic failure without coma: Secondary | ICD-10-CM | POA: Diagnosis present

## 2023-12-21 DIAGNOSIS — D65 Disseminated intravascular coagulation [defibrination syndrome]: Secondary | ICD-10-CM | POA: Diagnosis present

## 2023-12-21 DIAGNOSIS — F329 Major depressive disorder, single episode, unspecified: Secondary | ICD-10-CM | POA: Diagnosis present

## 2023-12-21 DIAGNOSIS — I5181 Takotsubo syndrome: Secondary | ICD-10-CM | POA: Diagnosis present

## 2023-12-21 DIAGNOSIS — J9601 Acute respiratory failure with hypoxia: Secondary | ICD-10-CM

## 2023-12-21 DIAGNOSIS — N179 Acute kidney failure, unspecified: Secondary | ICD-10-CM

## 2023-12-21 DIAGNOSIS — I5023 Acute on chronic systolic (congestive) heart failure: Secondary | ICD-10-CM | POA: Diagnosis present

## 2023-12-21 DIAGNOSIS — I2510 Atherosclerotic heart disease of native coronary artery without angina pectoris: Secondary | ICD-10-CM | POA: Diagnosis not present

## 2023-12-21 DIAGNOSIS — E8729 Other acidosis: Secondary | ICD-10-CM | POA: Diagnosis not present

## 2023-12-21 DIAGNOSIS — I469 Cardiac arrest, cause unspecified: Secondary | ICD-10-CM | POA: Diagnosis not present

## 2023-12-21 DIAGNOSIS — N170 Acute kidney failure with tubular necrosis: Secondary | ICD-10-CM | POA: Diagnosis present

## 2023-12-21 DIAGNOSIS — I110 Hypertensive heart disease with heart failure: Secondary | ICD-10-CM | POA: Diagnosis present

## 2023-12-21 DIAGNOSIS — Z8616 Personal history of COVID-19: Secondary | ICD-10-CM | POA: Diagnosis not present

## 2023-12-21 DIAGNOSIS — I2119 ST elevation (STEMI) myocardial infarction involving other coronary artery of inferior wall: Secondary | ICD-10-CM | POA: Diagnosis present

## 2023-12-21 DIAGNOSIS — E872 Acidosis, unspecified: Secondary | ICD-10-CM | POA: Diagnosis present

## 2023-12-21 DIAGNOSIS — E1169 Type 2 diabetes mellitus with other specified complication: Secondary | ICD-10-CM | POA: Diagnosis present

## 2023-12-21 DIAGNOSIS — Z66 Do not resuscitate: Secondary | ICD-10-CM | POA: Diagnosis not present

## 2023-12-21 DIAGNOSIS — I4901 Ventricular fibrillation: Secondary | ICD-10-CM | POA: Diagnosis present

## 2023-12-21 DIAGNOSIS — R570 Cardiogenic shock: Secondary | ICD-10-CM | POA: Diagnosis present

## 2023-12-21 DIAGNOSIS — E119 Type 2 diabetes mellitus without complications: Secondary | ICD-10-CM | POA: Diagnosis present

## 2023-12-21 DIAGNOSIS — I462 Cardiac arrest due to underlying cardiac condition: Secondary | ICD-10-CM | POA: Diagnosis present

## 2023-12-21 DIAGNOSIS — I2109 ST elevation (STEMI) myocardial infarction involving other coronary artery of anterior wall: Secondary | ICD-10-CM | POA: Diagnosis not present

## 2023-12-21 DIAGNOSIS — I213 ST elevation (STEMI) myocardial infarction of unspecified site: Secondary | ICD-10-CM | POA: Diagnosis present

## 2023-12-21 DIAGNOSIS — J4520 Mild intermittent asthma, uncomplicated: Secondary | ICD-10-CM | POA: Diagnosis present

## 2023-12-21 DIAGNOSIS — Z515 Encounter for palliative care: Secondary | ICD-10-CM | POA: Diagnosis not present

## 2023-12-21 DIAGNOSIS — I272 Pulmonary hypertension, unspecified: Secondary | ICD-10-CM | POA: Diagnosis present

## 2023-12-21 HISTORY — PX: VENTRICULAR ASSIST DEVICE INSERTION: CATH118273

## 2023-12-21 HISTORY — PX: CORONARY/GRAFT ACUTE MI REVASCULARIZATION: CATH118305

## 2023-12-21 HISTORY — PX: RIGHT/LEFT HEART CATH AND CORONARY ANGIOGRAPHY: CATH118266

## 2023-12-21 LAB — COMPREHENSIVE METABOLIC PANEL WITH GFR
ALT: <5 U/L (ref 0–44)
ALT: 205 U/L — ABNORMAL HIGH (ref 0–44)
AST: 70 U/L — ABNORMAL HIGH (ref 15–41)
AST: 281 U/L — ABNORMAL HIGH (ref 15–41)
Albumin: 3.1 g/dL — ABNORMAL LOW (ref 3.5–5.0)
Albumin: 2 g/dL — ABNORMAL LOW (ref 3.5–5.0)
Alkaline Phosphatase: 85 U/L (ref 38–126)
Alkaline Phosphatase: 72 U/L (ref 38–126)
Anion gap: 24 — ABNORMAL HIGH (ref 5–15)
Anion gap: 32 — ABNORMAL HIGH (ref 5–15)
BUN: 40 mg/dL — ABNORMAL HIGH (ref 8–23)
BUN: 39 mg/dL — ABNORMAL HIGH (ref 8–23)
CO2: 8 mmol/L — ABNORMAL LOW (ref 22–32)
CO2: 19 mmol/L — ABNORMAL LOW (ref 22–32)
Calcium: 7.7 mg/dL — ABNORMAL LOW (ref 8.9–10.3)
Calcium: 9.8 mg/dL (ref 8.9–10.3)
Chloride: 101 mmol/L (ref 98–111)
Chloride: 98 mmol/L (ref 98–111)
Creatinine, Ser: 3.72 mg/dL — ABNORMAL HIGH (ref 0.44–1.00)
Creatinine, Ser: 4.11 mg/dL — ABNORMAL HIGH (ref 0.44–1.00)
GFR, Estimated: 12 mL/min — ABNORMAL LOW (ref >60)
GFR, Estimated: 11 mL/min — ABNORMAL LOW (ref >60)
Glucose, Bld: 389 mg/dL — ABNORMAL HIGH (ref 70–99)
Glucose, Bld: 321 mg/dL — ABNORMAL HIGH (ref 70–99)
Potassium: 4 mmol/L (ref 3.5–5.1)
Potassium: 3.4 mmol/L — ABNORMAL LOW (ref 3.5–5.1)
Sodium: 133 mmol/L — ABNORMAL LOW (ref 135–145)
Sodium: 149 mmol/L — ABNORMAL HIGH (ref 135–145)
Total Bilirubin: 0.9 mg/dL (ref 0.0–1.2)
Total Bilirubin: 0.9 mg/dL (ref 0.0–1.2)
Total Protein: 6.1 g/dL — ABNORMAL LOW (ref 6.5–8.1)
Total Protein: 4.1 g/dL — ABNORMAL LOW (ref 6.5–8.1)

## 2023-12-21 LAB — POCT I-STAT 7, (LYTES, BLD GAS, ICA,H+H)
Acid-base deficit: 15 mmol/L — ABNORMAL HIGH (ref 0.0–2.0)
Acid-base deficit: 21 mmol/L — ABNORMAL HIGH (ref 0.0–2.0)
Acid-base deficit: 12 mmol/L — ABNORMAL HIGH (ref 0.0–2.0)
Bicarbonate: 13.7 mmol/L — ABNORMAL LOW (ref 20.0–28.0)
Bicarbonate: 9.5 mmol/L — ABNORMAL LOW (ref 20.0–28.0)
Bicarbonate: 15.1 mmol/L — ABNORMAL LOW (ref 20.0–28.0)
Calcium, Ion: 0.86 mmol/L — CL (ref 1.15–1.40)
Calcium, Ion: 0.91 mmol/L — ABNORMAL LOW (ref 1.15–1.40)
Calcium, Ion: 1.09 mmol/L — ABNORMAL LOW (ref 1.15–1.40)
HCT: 25 % — ABNORMAL LOW (ref 36.0–46.0)
HCT: 29 % — ABNORMAL LOW (ref 36.0–46.0)
HCT: 20 % — ABNORMAL LOW (ref 36.0–46.0)
Hemoglobin: 8.5 g/dL — ABNORMAL LOW (ref 12.0–15.0)
Hemoglobin: 9.9 g/dL — ABNORMAL LOW (ref 12.0–15.0)
Hemoglobin: 6.8 g/dL — CL (ref 12.0–15.0)
O2 Saturation: 94 %
O2 Saturation: 96 %
O2 Saturation: 91 %
Patient temperature: 36.4
Potassium: 3.4 mmol/L — ABNORMAL LOW (ref 3.5–5.1)
Potassium: 4.5 mmol/L (ref 3.5–5.1)
Potassium: 3.1 mmol/L — ABNORMAL LOW (ref 3.5–5.1)
Sodium: 140 mmol/L (ref 135–145)
Sodium: 142 mmol/L (ref 135–145)
Sodium: 143 mmol/L (ref 135–145)
TCO2: 11 mmol/L — ABNORMAL LOW (ref 22–32)
TCO2: 15 mmol/L — ABNORMAL LOW (ref 22–32)
TCO2: 16 mmol/L — ABNORMAL LOW (ref 22–32)
pCO2 arterial: 37.6 mmHg (ref 32–48)
pCO2 arterial: 44.7 mmHg (ref 32–48)
pCO2 arterial: 39.7 mmHg (ref 32–48)
pH, Arterial: 7.01 — CL (ref 7.35–7.45)
pH, Arterial: 7.093 — CL (ref 7.35–7.45)
pH, Arterial: 7.185 — CL (ref 7.35–7.45)
pO2, Arterial: 105 mmHg (ref 83–108)
pO2, Arterial: 107 mmHg (ref 83–108)
pO2, Arterial: 72 mmHg — ABNORMAL LOW (ref 83–108)

## 2023-12-21 LAB — CBC
HCT: 29.8 % — ABNORMAL LOW (ref 36.0–46.0)
HCT: 10.2 % — ABNORMAL LOW (ref 36.0–46.0)
Hemoglobin: 9.7 g/dL — ABNORMAL LOW (ref 12.0–15.0)
Hemoglobin: 3 g/dL — CL (ref 12.0–15.0)
MCH: 29.9 pg (ref 26.0–34.0)
MCH: 30 pg (ref 26.0–34.0)
MCHC: 32.6 g/dL (ref 30.0–36.0)
MCHC: 29.4 g/dL — ABNORMAL LOW (ref 30.0–36.0)
MCV: 92 fL (ref 80.0–100.0)
MCV: 102 fL — ABNORMAL HIGH (ref 80.0–100.0)
Platelets: 219 K/uL (ref 150–400)
Platelets: 112 K/uL — ABNORMAL LOW (ref 150–400)
RBC: 3.24 MIL/uL — ABNORMAL LOW (ref 3.87–5.11)
RBC: 1 MIL/uL — ABNORMAL LOW (ref 3.87–5.11)
RDW: 13.2 % (ref 11.5–15.5)
RDW: 13.6 % (ref 11.5–15.5)
WBC: 21.5 K/uL — ABNORMAL HIGH (ref 4.0–10.5)
WBC: 13.8 K/uL — ABNORMAL HIGH (ref 4.0–10.5)
nRBC: 0.1 % (ref 0.0–0.2)
nRBC: 0.6 % — ABNORMAL HIGH (ref 0.0–0.2)

## 2023-12-21 LAB — POCT I-STAT EG7
Acid-base deficit: 8 mmol/L — ABNORMAL HIGH (ref 0.0–2.0)
Bicarbonate: 19.9 mmol/L — ABNORMAL LOW (ref 20.0–28.0)
Calcium, Ion: 1.16 mmol/L (ref 1.15–1.40)
HCT: 24 % — ABNORMAL LOW (ref 36.0–46.0)
Hemoglobin: 8.2 g/dL — ABNORMAL LOW (ref 12.0–15.0)
O2 Saturation: 49 %
Patient temperature: 37.2
Potassium: 2.9 mmol/L — ABNORMAL LOW (ref 3.5–5.1)
Sodium: 149 mmol/L — ABNORMAL HIGH (ref 135–145)
TCO2: 21 mmol/L — ABNORMAL LOW (ref 22–32)
pCO2, Ven: 52.5 mmHg (ref 44–60)
pH, Ven: 7.188 — CL (ref 7.25–7.43)
pO2, Ven: 33 mmHg (ref 32–45)

## 2023-12-21 LAB — PROTIME-INR
INR: 1.3 — ABNORMAL HIGH (ref 0.8–1.2)
INR: 1.7 — ABNORMAL HIGH (ref 0.8–1.2)
Prothrombin Time: 16.7 s — ABNORMAL HIGH (ref 11.4–15.2)
Prothrombin Time: 21.2 s — ABNORMAL HIGH (ref 11.4–15.2)

## 2023-12-21 LAB — LIPID PANEL
Cholesterol: 105 mg/dL (ref 0–200)
HDL: 36 mg/dL — ABNORMAL LOW (ref >40)
LDL Cholesterol: 37 mg/dL (ref 0–99)
Total CHOL/HDL Ratio: 2.9 ratio
Triglycerides: 162 mg/dL — ABNORMAL HIGH (ref <150)
VLDL: 32 mg/dL (ref 0–40)

## 2023-12-21 LAB — DIC (DISSEMINATED INTRAVASCULAR COAGULATION)PANEL
D-Dimer, Quant: 14.17 ug{FEU}/mL — ABNORMAL HIGH (ref 0.00–0.50)
Fibrinogen: 78 mg/dL — CL (ref 210–475)
INR: 6.4 (ref 0.8–1.2)
Platelets: NORMAL K/uL (ref 150–400)
Prothrombin Time: 58.8 s — ABNORMAL HIGH (ref 11.4–15.2)
aPTT: >200 s (ref 24–36)

## 2023-12-21 LAB — GLUCOSE, CAPILLARY
Glucose-Capillary: 257 mg/dL — ABNORMAL HIGH (ref 70–99)
Glucose-Capillary: 160 mg/dL — ABNORMAL HIGH (ref 70–99)
Glucose-Capillary: 117 mg/dL — ABNORMAL HIGH (ref 70–99)

## 2023-12-21 LAB — CG4 I-STAT (LACTIC ACID)
Lactic Acid, Venous: >15 mmol/L (ref 0.5–1.9)
Lactic Acid, Venous: >15 mmol/L (ref 0.5–1.9)
Lactic Acid, Venous: >15 mmol/L (ref 0.5–1.9)
Lactic Acid, Venous: >15 mmol/L (ref 0.5–1.9)

## 2023-12-21 LAB — APTT: aPTT: 29 s (ref 24–36)

## 2023-12-21 LAB — PHOSPHORUS: Phosphorus: 11.3 mg/dL — ABNORMAL HIGH (ref 2.5–4.6)

## 2023-12-21 LAB — PREPARE RBC (CROSSMATCH)

## 2023-12-21 LAB — TROPONIN I (HIGH SENSITIVITY)
Troponin I (High Sensitivity): 18383 ng/L (ref <18)
Troponin I (High Sensitivity): >24000 ng/L (ref <18)

## 2023-12-21 LAB — MAGNESIUM: Magnesium: 1.5 mg/dL — ABNORMAL LOW (ref 1.7–2.4)

## 2023-12-21 LAB — POCT ACTIVATED CLOTTING TIME: Activated Clotting Time: 250 s

## 2023-12-21 LAB — MRSA NEXT GEN BY PCR, NASAL: MRSA by PCR Next Gen: NOT DETECTED

## 2023-12-21 SURGERY — CORONARY/GRAFT ACUTE MI REVASCULARIZATION
Anesthesia: LOCAL

## 2023-12-21 MED ORDER — NOREPINEPHRINE 4 MG/250ML-% IV SOLN
INTRAVENOUS | Status: AC
  Administered 2023-12-21: 45.067 ug/min via INTRAVENOUS
  Filled 2023-12-21: qty 250

## 2023-12-21 MED ORDER — EPINEPHRINE 1 MG/10ML IV SOSY
PREFILLED_SYRINGE | INTRAVENOUS | Status: DC | PRN
Start: 2023-12-21 — End: 2023-12-21
  Administered 2023-12-21 (×2): 1 mg via INTRAVENOUS

## 2023-12-21 MED ORDER — POLYVINYL ALCOHOL 1.4 % OP SOLN: 1.0000 [drp] | Freq: Four times a day (QID) | OPHTHALMIC | Status: DC | PRN

## 2023-12-21 MED ORDER — PIPERACILLIN-TAZOBACTAM 3.375 G IVPB 30 MIN
3.3750 g | Freq: Once | INTRAVENOUS | Status: AC
  Administered 2023-12-21: 3.375 g via INTRAVENOUS
  Filled 2023-12-21: qty 50

## 2023-12-21 MED ORDER — SODIUM BICARBONATE 8.4 % IV SOLN
100.0000 meq | Freq: Once | INTRAVENOUS | Status: AC
  Administered 2023-12-21: 100 meq via INTRAVENOUS
  Filled 2023-12-21: qty 100

## 2023-12-21 MED ORDER — EPINEPHRINE 1 MG/10ML IV SOSY
PREFILLED_SYRINGE | INTRAVENOUS | Status: AC
  Filled 2023-12-21: qty 10

## 2023-12-21 MED ORDER — ETOMIDATE 2 MG/ML IV SOLN
20.0000 mg | Freq: Once | INTRAVENOUS | Status: DC
  Filled 2023-12-21: qty 10

## 2023-12-21 MED ORDER — ORAL CARE MOUTH RINSE
15.0000 mL | OROMUCOSAL | Status: DC
  Administered 2023-12-21 (×5): 15 mL via OROMUCOSAL

## 2023-12-21 MED ORDER — MIDAZOLAM HCL 2 MG/2ML IJ SOLN
INTRAMUSCULAR | Status: DC | PRN
  Administered 2023-12-21: 2 mg via INTRAVENOUS

## 2023-12-21 MED ORDER — IOHEXOL 350 MG/ML SOLN
INTRAVENOUS | Status: DC | PRN
  Administered 2023-12-21: 50 mL via INTRA_ARTERIAL

## 2023-12-21 MED ORDER — PHENYLEPHRINE 80 MCG/ML (10ML) SYRINGE FOR IV PUSH (FOR BLOOD PRESSURE SUPPORT)
PREFILLED_SYRINGE | INTRAVENOUS | Status: AC
  Filled 2023-12-21: qty 10

## 2023-12-21 MED ORDER — PHENYLEPHRINE HCL-NACL 20-0.9 MG/250ML-% IV SOLN
INTRAVENOUS | Status: AC
  Filled 2023-12-21: qty 250

## 2023-12-21 MED ORDER — DULOXETINE HCL 30 MG PO CPEP: 30.0000 mg | ORAL_CAPSULE | Freq: Every day | ORAL | Status: DC

## 2023-12-21 MED ORDER — SODIUM CHLORIDE 0.9% IV SOLUTION: Freq: Once | INTRAVENOUS | Status: AC

## 2023-12-21 MED ORDER — HYDROCORTISONE SOD SUC (PF) 100 MG IJ SOLR
100.0000 mg | Freq: Two times a day (BID) | INTRAMUSCULAR | Status: DC
  Administered 2023-12-21: 100 mg via INTRAVENOUS
  Filled 2023-12-21: qty 2

## 2023-12-21 MED ORDER — AMIODARONE HCL IN DEXTROSE 360-4.14 MG/200ML-% IV SOLN
60.0000 mg/h | INTRAVENOUS | Status: DC
  Administered 2023-12-21: 60 mg/h via INTRAVENOUS
  Filled 2023-12-21: qty 200

## 2023-12-21 MED ORDER — CALCIUM CHLORIDE 10 % IV SOLN
INTRAVENOUS | Status: DC | PRN
  Administered 2023-12-21 (×2): 1 g via INTRAVENOUS

## 2023-12-21 MED ORDER — LIDOCAINE HCL (PF) 1 % IJ SOLN
INTRAMUSCULAR | Status: AC
Start: 2023-12-21 — End: 2023-12-21
  Filled 2023-12-21: qty 30

## 2023-12-21 MED ORDER — CALCIUM CHLORIDE 10 % IV SOLN
INTRAVENOUS | Status: AC
  Administered 2023-12-21: 1 g via INTRAVENOUS
  Filled 2023-12-21: qty 20

## 2023-12-21 MED ORDER — PHENYLEPHRINE HCL-NACL 20-0.9 MG/250ML-% IV SOLN
0.0000 ug/min | INTRAVENOUS | Status: DC
  Administered 2023-12-21: 50 ug/min via INTRAVENOUS
  Filled 2023-12-21 (×2): qty 250

## 2023-12-21 MED ORDER — LIDOCAINE-EPINEPHRINE 1 %-1:100000 IJ SOLN
INTRAMUSCULAR | Status: AC
  Filled 2023-12-21: qty 1

## 2023-12-21 MED ORDER — NOREPINEPHRINE 16 MG/250ML-% IV SOLN
0.0000 ug/min | INTRAVENOUS | Status: DC
  Administered 2023-12-21: 45 ug/min via INTRAVENOUS
  Filled 2023-12-21 (×2): qty 250

## 2023-12-21 MED ORDER — ACETAMINOPHEN 325 MG PO TABS: 650.0000 mg | ORAL_TABLET | Freq: Four times a day (QID) | ORAL | Status: DC | PRN

## 2023-12-21 MED ORDER — SODIUM BICARBONATE 8.4 % IV SOLN
INTRAVENOUS | Status: AC
  Filled 2023-12-21: qty 200

## 2023-12-21 MED ORDER — DEXTROSE 5 % IV SOLN
INTRAVENOUS | Status: DC
  Filled 2023-12-21: qty 25

## 2023-12-21 MED ORDER — CALCIUM CHLORIDE 10 % IV SOLN
1.0000 g | Freq: Once | INTRAVENOUS | Status: AC
  Administered 2023-12-21: 1 g via INTRAVENOUS

## 2023-12-21 MED ORDER — HEPARIN SODIUM (PORCINE) 1000 UNIT/ML IJ SOLN
INTRAMUSCULAR | Status: DC | PRN
  Administered 2023-12-21: 7000 [IU] via INTRAVENOUS

## 2023-12-21 MED ORDER — ACETAMINOPHEN 325 MG PO TABS: 650.0000 mg | ORAL_TABLET | ORAL | Status: DC | PRN

## 2023-12-21 MED ORDER — SODIUM BICARBONATE 8.4 % IV SOLN
INTRAVENOUS | Status: AC
  Filled 2023-12-21: qty 50

## 2023-12-21 MED ORDER — SODIUM CHLORIDE 0.9 % IV SOLN
10.0000 mg | INTRAVENOUS | Status: DC
  Administered 2023-12-21: 10 mg via INTRAVENOUS
  Filled 2023-12-21 (×2): qty 1

## 2023-12-21 MED ORDER — CALCIUM CHLORIDE 10 % IV SOLN
INTRAVENOUS | Status: AC
  Filled 2023-12-21: qty 10

## 2023-12-21 MED ORDER — ETOMIDATE 2 MG/ML IV SOLN
INTRAVENOUS | Status: AC | PRN
  Administered 2023-12-21: 20 mg via INTRAVENOUS

## 2023-12-21 MED ORDER — STERILE WATER FOR INJECTION IV SOLN
INTRAVENOUS | Status: DC
  Filled 2023-12-21 (×2): qty 150
  Filled 2023-12-21: qty 1000

## 2023-12-21 MED ORDER — FENTANYL BOLUS VIA INFUSION: 100.0000 ug | INTRAVENOUS | Status: DC | PRN

## 2023-12-21 MED ORDER — GLYCOPYRROLATE 0.2 MG/ML IJ SOLN: 0.2000 mg | INTRAMUSCULAR | Status: DC | PRN

## 2023-12-21 MED ORDER — ALBUMIN HUMAN 5 % IV SOLN
INTRAVENOUS | Status: AC
Start: 2023-12-21 — End: 2023-12-21
  Filled 2023-12-21: qty 250

## 2023-12-21 MED ORDER — EPINEPHRINE HCL 5 MG/250ML IV SOLN IN NS
INTRAVENOUS | Status: AC
  Administered 2023-12-21: 30 ug/min via INTRAVENOUS
  Filled 2023-12-21: qty 250

## 2023-12-21 MED ORDER — EPINEPHRINE 1 MG/10ML IV SOSY
PREFILLED_SYRINGE | INTRAVENOUS | Status: AC
  Filled 2023-12-21: qty 20

## 2023-12-21 MED ORDER — FENTANYL CITRATE (PF) 50 MCG/ML IJ SOSY
25.0000 ug | PREFILLED_SYRINGE | Freq: Once | INTRAMUSCULAR | Status: AC
  Administered 2023-12-21: 25 ug via INTRAVENOUS

## 2023-12-21 MED ORDER — SODIUM CHLORIDE 0.9% FLUSH: 3.0000 mL | INTRAVENOUS | Status: DC | PRN

## 2023-12-21 MED ORDER — SODIUM BICARBONATE 8.4 % IV SOLN
INTRAVENOUS | Status: DC | PRN
  Administered 2023-12-21 (×7): 50 meq via INTRAVENOUS

## 2023-12-21 MED ORDER — ALBUMIN HUMAN 5 % IV SOLN
INTRAVENOUS | Status: AC
  Filled 2023-12-21: qty 500

## 2023-12-21 MED ORDER — EPINEPHRINE 1 MG/10ML IV SOSY
PREFILLED_SYRINGE | INTRAVENOUS | Status: AC | PRN
Start: 2023-12-21 — End: 2023-12-21
  Administered 2023-12-21 (×3): 1 mg via INTRAVENOUS

## 2023-12-21 MED ORDER — SODIUM CHLORIDE 0.9 % IV SOLN: INTRAVENOUS | Status: DC

## 2023-12-21 MED ORDER — LIDOCAINE-EPINEPHRINE 1 %-1:100000 IJ SOLN
INTRAMUSCULAR | Status: DC | PRN
  Administered 2023-12-21: 10 mL

## 2023-12-21 MED ORDER — IPRATROPIUM-ALBUTEROL 0.5-2.5 (3) MG/3ML IN SOLN: 3.0000 mL | RESPIRATORY_TRACT | Status: DC | PRN

## 2023-12-21 MED ORDER — MIDAZOLAM BOLUS VIA INFUSION (WITHDRAWAL LIFE SUSTAINING TX): 2.0000 mg | INTRAVENOUS | Status: DC | PRN

## 2023-12-21 MED ORDER — LIDOCAINE HCL (PF) 1 % IJ SOLN
INTRAMUSCULAR | Status: DC | PRN
  Administered 2023-12-21: 12 mL

## 2023-12-21 MED ORDER — FENTANYL CITRATE (PF) 100 MCG/2ML IJ SOLN
INTRAMUSCULAR | Status: AC
  Filled 2023-12-21: qty 2

## 2023-12-21 MED ORDER — EPINEPHRINE HCL 5 MG/250ML IV SOLN IN NS
INTRAVENOUS | Status: AC | PRN
  Administered 2023-12-21: 5 ug/min via INTRAVENOUS

## 2023-12-21 MED ORDER — ASPIRIN 81 MG PO CHEW: 81.0000 mg | CHEWABLE_TABLET | Freq: Every day | ORAL | Status: DC

## 2023-12-21 MED ORDER — DIPHENHYDRAMINE HCL 50 MG/ML IJ SOLN: 25.0000 mg | INTRAMUSCULAR | Status: DC | PRN

## 2023-12-21 MED ORDER — MAGNESIUM SULFATE 4 GM/100ML IV SOLN
4.0000 g | Freq: Once | INTRAVENOUS | Status: AC
  Administered 2023-12-21: 4 g via INTRAVENOUS
  Filled 2023-12-21: qty 100

## 2023-12-21 MED ORDER — TRAZODONE HCL 50 MG PO TABS: 75.0000 mg | ORAL_TABLET | Freq: Every day | ORAL | Status: DC

## 2023-12-21 MED ORDER — ALBUMIN HUMAN 25 % IV SOLN
INTRAVENOUS | Status: AC
  Filled 2023-12-21: qty 50

## 2023-12-21 MED ORDER — FENTANYL CITRATE (PF) 50 MCG/ML IJ SOSY
50.0000 ug | PREFILLED_SYRINGE | INTRAMUSCULAR | Status: DC | PRN
  Administered 2023-12-21: 50 ug via INTRAVENOUS
  Filled 2023-12-21: qty 1

## 2023-12-21 MED ORDER — PIPERACILLIN-TAZOBACTAM 3.375 G IVPB
3.3750 g | Freq: Two times a day (BID) | INTRAVENOUS | Status: DC
  Administered 2023-12-21: 3.375 g via INTRAVENOUS
  Filled 2023-12-21: qty 50

## 2023-12-21 MED ORDER — MIDAZOLAM HCL 2 MG/2ML IJ SOLN
INTRAMUSCULAR | Status: AC
  Filled 2023-12-21: qty 2

## 2023-12-21 MED ORDER — ACETAMINOPHEN 650 MG RE SUPP: 650.0000 mg | Freq: Four times a day (QID) | RECTAL | Status: DC | PRN

## 2023-12-21 MED ORDER — SUCCINYLCHOLINE CHLORIDE 200 MG/10ML IV SOSY
100.0000 mg | PREFILLED_SYRINGE | Freq: Once | INTRAVENOUS | Status: DC
  Filled 2023-12-21: qty 10

## 2023-12-21 MED ORDER — VERAPAMIL HCL 2.5 MG/ML IV SOLN
INTRAVENOUS | Status: AC
  Filled 2023-12-21: qty 2

## 2023-12-21 MED ORDER — SODIUM CHLORIDE 0.9 % IV SOLN
250.0000 mL | INTRAVENOUS | Status: DC
  Administered 2023-12-21: 250 mL via INTRAVENOUS

## 2023-12-21 MED ORDER — VASOPRESSIN 20 UNITS/100 ML INFUSION FOR SHOCK
0.0000 [IU]/min | INTRAVENOUS | Status: DC
  Administered 2023-12-21 (×2): 0.04 [IU]/min via INTRAVENOUS
  Administered 2023-12-21: 0.03 [IU]/min via INTRAVENOUS
  Filled 2023-12-21 (×3): qty 100

## 2023-12-21 MED ORDER — ALBUMIN HUMAN 5 % IV SOLN: 25.0000 g | Freq: Once | INTRAVENOUS | Status: DC

## 2023-12-21 MED ORDER — SUCCINYLCHOLINE CHLORIDE 20 MG/ML IJ SOLN
INTRAMUSCULAR | Status: AC | PRN
Start: 2023-12-21 — End: 2023-12-21
  Administered 2023-12-21: 100 mg via INTRAVENOUS

## 2023-12-21 MED ORDER — SODIUM CHLORIDE 0.9% FLUSH
3.0000 mL | Freq: Two times a day (BID) | INTRAVENOUS | Status: DC
  Administered 2023-12-21: 3 mL via INTRAVENOUS

## 2023-12-21 MED ORDER — NOREPINEPHRINE 4 MG/250ML-% IV SOLN
0.0000 ug/min | INTRAVENOUS | Status: DC
  Administered 2023-12-21: 45 ug/min via INTRAVENOUS
  Administered 2023-12-21: 5 ug/min via INTRAVENOUS
  Administered 2023-12-21: 45 ug/min via INTRAVENOUS
  Filled 2023-12-21 (×4): qty 250

## 2023-12-21 MED ORDER — FENTANYL 2500MCG IN NS 250ML (10MCG/ML) PREMIX INFUSION: 0.0000 ug/h | INTRAVENOUS | Status: DC

## 2023-12-21 MED ORDER — EPINEPHRINE HCL 5 MG/250ML IV SOLN IN NS
0.5000 ug/min | INTRAVENOUS | Status: DC
  Administered 2023-12-21 (×2): 30 ug/min via INTRAVENOUS
  Filled 2023-12-21 (×3): qty 250

## 2023-12-21 MED ORDER — FAMOTIDINE 20 MG PO TABS: 20.0000 mg | ORAL_TABLET | Freq: Every day | ORAL | Status: DC

## 2023-12-21 MED ORDER — HEPARIN SODIUM (PORCINE) 1000 UNIT/ML IJ SOLN
INTRAMUSCULAR | Status: AC
  Filled 2023-12-21: qty 10

## 2023-12-21 MED ORDER — POTASSIUM CHLORIDE 10 MEQ/100ML IV SOLN
10.0000 meq | INTRAVENOUS | Status: AC
  Administered 2023-12-21 (×2): 10 meq via INTRAVENOUS
  Filled 2023-12-21: qty 100

## 2023-12-21 MED ORDER — METHYLENE BLUE (ANTIDOTE) 1 % IV SOLN
70.0000 mg | Freq: Once | INTRAVENOUS | Status: AC
  Administered 2023-12-21: 70 mg via INTRAVENOUS
  Filled 2023-12-21: qty 7

## 2023-12-21 MED ORDER — SODIUM BICARBONATE 8.4 % IV SOLN: 50.0000 meq | Freq: Once | INTRAVENOUS | Status: AC

## 2023-12-21 MED ORDER — FENTANYL CITRATE (PF) 100 MCG/2ML IJ SOLN
INTRAMUSCULAR | Status: DC | PRN
  Administered 2023-12-21: 50 ug via INTRAVENOUS

## 2023-12-21 MED ORDER — DEXTROSE 5 % IV SOLN
150.0000 mg | Freq: Once | INTRAVENOUS | Status: AC
  Administered 2023-12-21: 150 mg via INTRAVENOUS

## 2023-12-21 MED ORDER — BUDESONIDE 0.5 MG/2ML IN SUSP
0.5000 mg | Freq: Two times a day (BID) | RESPIRATORY_TRACT | Status: DC
  Administered 2023-12-21: 0.5 mg via RESPIRATORY_TRACT
  Filled 2023-12-21: qty 2

## 2023-12-21 MED ORDER — POTASSIUM CHLORIDE 10 MEQ/100ML IV SOLN
10.0000 meq | INTRAVENOUS | Status: DC
  Filled 2023-12-21: qty 100

## 2023-12-21 MED ORDER — ARFORMOTEROL TARTRATE 15 MCG/2ML IN NEBU
15.0000 ug | INHALATION_SOLUTION | Freq: Two times a day (BID) | RESPIRATORY_TRACT | Status: DC
  Administered 2023-12-21: 15 ug via RESPIRATORY_TRACT
  Filled 2023-12-21: qty 2

## 2023-12-21 MED ORDER — INSULIN ASPART 100 UNIT/ML IJ SOLN
0.0000 [IU] | INTRAMUSCULAR | Status: DC
  Administered 2023-12-21: 8 [IU] via SUBCUTANEOUS
  Administered 2023-12-21: 3 [IU] via SUBCUTANEOUS

## 2023-12-21 MED ORDER — VASOPRESSIN 20 UNITS/100 ML INFUSION FOR SHOCK
INTRAVENOUS | Status: AC
  Filled 2023-12-21: qty 100

## 2023-12-21 MED ORDER — MIDAZOLAM HCL 2 MG/2ML IJ SOLN: 1.0000 mg | INTRAMUSCULAR | Status: DC | PRN

## 2023-12-21 MED ORDER — MIDAZOLAM HCL 2 MG/2ML IJ SOLN
1.0000 mg | INTRAMUSCULAR | Status: DC | PRN
  Administered 2023-12-21: 1 mg via INTRAVENOUS
  Filled 2023-12-21: qty 2

## 2023-12-21 MED ORDER — CALCIUM CHLORIDE 10 % IV SOLN
INTRAVENOUS | Status: AC
  Filled 2023-12-21: qty 20

## 2023-12-21 MED ORDER — ORAL CARE MOUTH RINSE: 15.0000 mL | OROMUCOSAL | Status: DC | PRN

## 2023-12-21 MED ORDER — MIDAZOLAM-SODIUM CHLORIDE 100-0.9 MG/100ML-% IV SOLN: 0.0000 mg/h | INTRAVENOUS | Status: DC

## 2023-12-21 MED ORDER — CALCIUM CHLORIDE 10 % IV SOLN: 1.0000 g | Freq: Once | INTRAVENOUS | Status: AC

## 2023-12-21 MED ORDER — ONDANSETRON HCL 4 MG/2ML IJ SOLN: 4.0000 mg | Freq: Four times a day (QID) | INTRAMUSCULAR | Status: DC | PRN

## 2023-12-21 MED ORDER — SODIUM BICARBONATE 8.4 % IV SOLN
INTRAVENOUS | Status: AC
Start: 2023-12-21 — End: 2023-12-21
  Filled 2023-12-21: qty 100

## 2023-12-21 MED ORDER — CALCIUM GLUCONATE-NACL 2-0.675 GM/100ML-% IV SOLN
2.0000 g | INTRAVENOUS | Status: AC
  Administered 2023-12-21 (×2): 2000 mg via INTRAVENOUS
  Filled 2023-12-21 (×2): qty 100

## 2023-12-21 MED ORDER — REVEFENACIN 175 MCG/3ML IN SOLN
175.0000 ug | Freq: Every day | RESPIRATORY_TRACT | Status: DC
  Administered 2023-12-21: 175 ug via RESPIRATORY_TRACT
  Filled 2023-12-21: qty 3

## 2023-12-21 MED ORDER — SODIUM CHLORIDE 0.9 % IV SOLN: 250.0000 mL | INTRAVENOUS | Status: DC | PRN

## 2023-12-21 MED ORDER — GLYCOPYRROLATE 1 MG PO TABS: 1.0000 mg | ORAL_TABLET | ORAL | Status: DC | PRN

## 2023-12-21 MED ORDER — AMIODARONE IV BOLUS ONLY 150 MG/100ML
INTRAVENOUS | Status: AC
  Filled 2023-12-21: qty 100

## 2023-12-21 MED ORDER — MORPHINE SULFATE (PF) 2 MG/ML IV SOLN: 1.0000 mg | Freq: Once | INTRAVENOUS | Status: DC

## 2023-12-21 MED ORDER — FENTANYL CITRATE (PF) 50 MCG/ML IJ SOSY
50.0000 ug | PREFILLED_SYRINGE | INTRAMUSCULAR | Status: DC | PRN
  Administered 2023-12-21: 50 ug via INTRAVENOUS
  Filled 2023-12-21 (×2): qty 1

## 2023-12-21 MED ORDER — HEPARIN (PORCINE) IN NACL 1000-0.9 UT/500ML-% IV SOLN
INTRAVENOUS | Status: DC | PRN
  Administered 2023-12-21 (×2): 500 mL

## 2023-12-21 MED ORDER — GLYCOPYRROLATE 0.2 MG/ML IJ SOLN
0.2000 mg | INTRAMUSCULAR | Status: DC | PRN
  Administered 2023-12-21: 0.2 mg via INTRAVENOUS
  Filled 2023-12-21: qty 1

## 2023-12-21 MED ORDER — ATORVASTATIN CALCIUM 80 MG PO TABS: 80.0000 mg | ORAL_TABLET | Freq: Every day | ORAL | Status: DC

## 2023-12-21 SURGICAL SUPPLY — 21 items
CATH 5FR JL3.5 JR4 ANG PIG MP (CATHETERS) IMPLANT
CATH LAUNCHER 6FR EBU3.5 (CATHETERS) IMPLANT
CATH SWAN GANZ 7F STRAIGHT (CATHETERS) IMPLANT
CLOSURE MYNX CONTROL 6F/7F (Vascular Products) IMPLANT
DEVICE IMPELLA CP SMRT ASSIST (CATHETERS) IMPLANT
GLIDESHEATH SLEND SS 6F .021 (SHEATH) IMPLANT
GLIDESHEATH SLENDER 7FR .021G (SHEATH) IMPLANT
GUIDEWIRE ANGLED .035X150CM (WIRE) IMPLANT
GUIDEWIRE INQWIRE 1.5J.035X260 (WIRE) IMPLANT
KIT ENCORE 26 ADVANTAGE (KITS) IMPLANT
KIT SYRINGE INJ CVI SPIKEX1 (MISCELLANEOUS) IMPLANT
MAT PREVALON FULL STRYKER (MISCELLANEOUS) IMPLANT
PACK CARDIAC CATHETERIZATION (CUSTOM PROCEDURE TRAY) ×2 IMPLANT
SET ATX-X65L (MISCELLANEOUS) IMPLANT
SHEATH PINNACLE 6F 10CM (SHEATH) IMPLANT
SHEATH PINNACLE 7F 10CM (SHEATH) IMPLANT
SHEATH PROBE COVER 6X72 (BAG) IMPLANT
SHIELD CATH-GARD CONTAMINATION (MISCELLANEOUS) IMPLANT
WIRE ASAHI PROWATER 180CM (WIRE) IMPLANT
WIRE EMERALD 3MM-J .035X150CM (WIRE) IMPLANT
WIRE MICRO SET SILHO 5FR 7 (SHEATH) IMPLANT

## 2023-12-22 LAB — POCT I-STAT EG7
Acid-base deficit: 15 mmol/L — ABNORMAL HIGH (ref 0.0–2.0)
Bicarbonate: 15.1 mmol/L — ABNORMAL LOW (ref 20.0–28.0)
Calcium, Ion: 0.85 mmol/L — CL (ref 1.15–1.40)
HCT: 26 % — ABNORMAL LOW (ref 36.0–46.0)
Hemoglobin: 8.8 g/dL — ABNORMAL LOW (ref 12.0–15.0)
O2 Saturation: 58 %
Potassium: 3.1 mmol/L — ABNORMAL LOW (ref 3.5–5.1)
Sodium: 143 mmol/L (ref 135–145)
TCO2: 17 mmol/L — ABNORMAL LOW (ref 22–32)
pCO2, Ven: 57.1 mmHg (ref 44–60)
pH, Ven: 7.031 — CL (ref 7.25–7.43)
pO2, Ven: 44 mmHg (ref 32–45)

## 2023-12-22 LAB — PREPARE FRESH FROZEN PLASMA

## 2023-12-22 LAB — BPAM CRYOPRECIPITATE
Blood Product Expiration Date: 202510142359
ISSUE DATE / TIME: 202510121039
Unit Type and Rh: 5100

## 2023-12-22 LAB — PREPARE CRYOPRECIPITATE: Unit division: 0

## 2023-12-22 LAB — BPAM FFP
Blood Product Expiration Date: 202510152359
ISSUE DATE / TIME: 202510121038
Unit Type and Rh: 6200

## 2023-12-22 LAB — BPAM PLATELET PHERESIS
Blood Product Expiration Date: 202510142359
ISSUE DATE / TIME: 202510121057
Unit Type and Rh: 6200

## 2023-12-22 LAB — PREPARE PLATELET PHERESIS: Unit division: 0

## 2023-12-23 LAB — LIPOPROTEIN A (LPA): Lipoprotein (a): 38.1 nmol/L — ABNORMAL HIGH (ref <75.0)

## 2023-12-25 LAB — BPAM RBC
Blood Product Expiration Date: 202511062359
Blood Product Expiration Date: 202511062359
Blood Product Expiration Date: 202511062359
Blood Product Expiration Date: 202511062359
ISSUE DATE / TIME: 202510121116
ISSUE DATE / TIME: 202510121116
ISSUE DATE / TIME: 202510121116
ISSUE DATE / TIME: 202510121116
Unit Type and Rh: 5100
Unit Type and Rh: 5100
Unit Type and Rh: 5100
Unit Type and Rh: 5100

## 2023-12-25 LAB — TYPE AND SCREEN
ABO/RH(D): O POS
Antibody Screen: NEGATIVE
Unit division: 0
Unit division: 0
Unit division: 0
Unit division: 0

## 2024-01-10 NOTE — Procedures (Signed)
 Radial arterial line placement.    Emergent consent assumed. The left axillary was prepped and draped in the routine sterile fashion a single lumen radial arterial catheter was placed in the left axillary radial artery using a modified Seldinger technique. Good blood flow and wave forms. A dressing was placed.     Nicole JINNY Brownie, MD  5:42 AM

## 2024-01-10 NOTE — Consult Note (Signed)
 NAME:  Nicole [name], MRN:  [id number], DOB:  September 03, 1945, LOS: 0 ADMISSION DATE:  12/20/2023, CONSULTATION DATE:  January 02, 2024  REFERRING MD: MD Cesario CHIEF COMPLAINT:  Chest Pain   History of Present Illness:  Pt is a 78 yr old female with significant pmhx of CAD ( DES to OMI 2021) last ECHO , Mild Intermittent Asthma, HTN, HLD, DM2, MDD, and GERD who presents with 2 day hx of substernal chest pain and shortness of breath. Patient was placed on CPAP in field by EMS and changed over to BIPAP. Cardiology consulted, Code STEMI called. Patient received 1 of nitroglycerin , SBP originally 90s but after decreased to 70s. Initial ST elevation noted in leads V3- V4, but also showed inferior ST elevations as well. Patient was noted to be in cardiogenic shock, significant hypoperfusion and lactic acidosis (8.8). Patient unable to lie down flat for cath, intubated ,unfortunately vfib cardiac arrest, emergently taken to cath lab. Patient had multiple rounds of CPR during cath lab ( ~15 mins total time). Impella placed during case. MD Ladona consulted PCCM to assist with ICU care and further vent management. Per report, patient noted to have multivessel CAD but no coronary culprit identified, suspecting Takotsubo.    Pertinent  Medical History   Past Medical History:  Diagnosis Date   Anemia    Anxiety    Arthritis    Asthma    Back pain    CAD (coronary artery disease) 10/22/2019   DES to OM1 August 2021   Colon polyps    COVID-28 June 2019   Depression    Endometriosis    Fibromyalgia    Gallstones    GERD with esophagitis    Follows with Dr. Aneita with GI, EGD in the past   HTN (hypertension)    IBS (irritable bowel syndrome)    Major depressive disorder    Migraine    Mild intermittent asthma    Mixed diabetic hyperlipidemia associated with type 2 diabetes mellitus (HCC)    Pneumonia    Type 2 diabetes mellitus (HCC)    Urinary incontinence      Significant Hospital Events: Including  procedures, antibiotic start and stop dates in addition to other pertinent events   10/11 Code STEMI, STE in V3-V4, Inferior lead, intubated> vfib arrest> Cath, concern for Ta  Interim History / Subjective:  Intubated Cardiogenic shock  Impella/Swan   ~MAP -59   Objective    Blood pressure (!) 106/59, pulse (!) 148, temperature 98 F (36.7 C), temperature source Axillary, resp. rate (!) 32, height 5' 3 (1.6 m), weight 72.6 kg, SpO2 (!) 0%.    Vent Mode: PRVC FiO2 (%):  [40 %-100 %] 100 % Set Rate:  [18 bmp] 18 bmp Vt Set:  [440 mL] 440 mL PEEP:  [5 cmH20] 5 cmH20 Plateau Pressure:  [17 cmH20] 17 cmH20  No intake or output data in the 24 hours ending 01/02/2024 0229 Filed Weights   12/20/23 2341  Weight: 72.6 kg    Examination: General: acute on chronic female, lying in icu bed on vent  HEENT: Normocephalic, PERRLA intact, ETT, OG, Pink MM CV: s1,s2, RRR-tachy 115, swan left, impella fem  pulm: coarse, diminished, no distress on vent  Abs: bs active, soft  Extremities: moves to painful stimuli Skin: no rash  Neuro: Rass -3, responds to painful stimuli, cough gag reflex present  GU: foley intact   Resolved problem list   Assessment and Plan  V-Fib Arrest Cardiogenic  Shock- concern for Takosubo  Lactic Acidosis- initially 8.8, now > 15.0  Currently on epi 20mcg, levophed 30 mcg, bicarb gtt Impella> MAP  P: Continue levophed, epi, add shock dose vaso Will place aline, MAP goal > 65 Obtain and trend Coox  Repeat ABG, repeat lactate, CMP, Mag, Phos, Trop, Pt/INR,  Continue bicarb gtt  Acute Hypoxic Respiratory Failure in setting of above  Concern for aspiration  Hx of Asthma P:  Continue ventilator support and lung protective strategies  Continue LTVV  Wean PEEP and Fio2 requirements to sat goal of >92%  HOB > 30 degrees Plat < 30  Aim for Driving pressures < 15  Intermittent Chest X-ray and ABGS VAP and PAD protocols in place  Wean sedation as tolerated, SBT  and WUA daily  Obtain tracheal aspirate, place on zosyn   Albuterol  prn   Acute Kidney Injury in setting of above  P: Continue to trend renal function daily  Continue to monitor and optimize electrolytes daily Continue to monitor urine output Continue strict I/Os Continue Adequate renal perfusion  Avoid nephrotoxic agents   HTN  HLD  P: Hold antihypertensives  Restart statin   DM2 P: Place on SSI CBG q4    Labs   CBC: Recent Labs  Lab 12/20/23 2344  WBC 19.2*  NEUTROABS 15.6*  HGB 12.0  HCT 38.7  MCV 94.4  PLT 268    Basic Metabolic Panel: Recent Labs  Lab 12/20/23 2344  NA 133*  K 4.0  CL 101  CO2 8*  GLUCOSE 389*  BUN 40*  CREATININE 3.72*  CALCIUM  7.7*   GFR: Estimated Creatinine Clearance: 12.3 mL/min (A) (by C-G formula based on SCr of 3.72 mg/dL (H)). Recent Labs  Lab 12/20/23 2344 12/20/23 2346  WBC 19.2*  --   LATICACIDVEN  --  8.8*    Liver Function Tests: Recent Labs  Lab 12/20/23 2344  AST 70*  ALT <5  ALKPHOS 85  BILITOT 0.9  PROT 6.1*  ALBUMIN  3.1*   No results for input(s): LIPASE, AMYLASE in the last 168 hours. No results for input(s): AMMONIA in the last 168 hours.  ABG    Component Value Date/Time   PHART 7.357 12/12/2019 1430   PCO2ART 34.8 12/12/2019 1430   PO2ART 82.2 (L) 12/12/2019 1430   HCO3 18.3 (L) 12/12/2019 1430   TCO2 17 (L) 08/27/2020 0348   ACIDBASEDEF 5.6 (H) 12/12/2019 1430   O2SAT 91.1 12/12/2019 1430     Coagulation Profile: Recent Labs  Lab 12/20/23 2344  INR 1.3*    Cardiac Enzymes: No results for input(s): CKTOTAL, CKMB, CKMBINDEX, TROPONINI in the last 168 hours.  HbA1C: Hgb A1c MFr Bld  Date/Time Value Ref Range Status  12/20/2023 11:44 PM 6.1 (H) 4.8 - 5.6 % Final    Comment:    (NOTE) Diagnosis of Diabetes The following HbA1c ranges recommended by the American Diabetes Association (ADA) may be used as an aid in the diagnosis of diabetes  mellitus.  Hemoglobin             Suggested A1C NGSP%              Diagnosis  <5.7                   Non Diabetic  5.7-6.4                Pre-Diabetic  >6.4  Diabetic  <7.0                   Glycemic control for                       adults with diabetes.    03/11/2021 05:32 PM 6.2 (H) 4.8 - 5.6 % Final    Comment:    (NOTE) Pre diabetes:          5.7%-6.4%  Diabetes:              >6.4%  Glycemic control for   <7.0% adults with diabetes     CBG: No results for input(s): GLUCAP in the last 168 hours.  Review of Systems:   See HPI   Past Medical History:  She,  has a past medical history of Anemia, Anxiety, Arthritis, Asthma, Back pain, CAD (coronary artery disease) (10/22/2019), Colon polyps, COVID-19, Depression, Endometriosis, Fibromyalgia, Gallstones, GERD with esophagitis, HTN (hypertension), IBS (irritable bowel syndrome), Major depressive disorder, Migraine, Mild intermittent asthma, Mixed diabetic hyperlipidemia associated with type 2 diabetes mellitus (HCC), Pneumonia, Type 2 diabetes mellitus (HCC), and Urinary incontinence.   Surgical History:   Past Surgical History:  Procedure Laterality Date   ABDOMINAL HYSTERECTOMY     TAH BSO   APPENDECTOMY  03/11/1997   CATARACT EXTRACTION Bilateral    CHOLECYSTECTOMY     CORONARY STENT INTERVENTION N/A 10/21/2019   Procedure: CORONARY STENT INTERVENTION;  Surgeon: Burnard Debby LABOR, MD;  Location: [facility];  Service: Cardiovascular;  Laterality: N/A;   DENTAL SURGERY     HERNIA REPAIR  03/11/1998   LAPAROSCOPIC ENDOMETRIOSIS FULGURATION     RIGHT/LEFT HEART CATH AND CORONARY ANGIOGRAPHY N/A 10/21/2019   Procedure: RIGHT/LEFT HEART CATH AND CORONARY ANGIOGRAPHY;  Surgeon: Burnard Debby LABOR, MD;  Location: [facility];  Service: Cardiovascular;  Laterality: N/A;     Social History:   reports that she has never smoked. She has been exposed to tobacco smoke. She has never used  smokeless tobacco. She reports that she does not currently use alcohol. She reports that she does not use drugs.   Family History:  Her family history includes Bone cancer in her paternal aunt; Cancer in her brother; Diabetes in her daughter, father, and mother; Heart attack in her daughter and father; Heart failure in her father and mother; Hypertension in her father, maternal grandfather, maternal grandmother, mother, paternal grandfather, and paternal grandmother; Lung cancer in her brother and paternal uncle; Other in her brother and brother; Stroke in her father. There is no history of Colon cancer or Stomach cancer.   Allergies Allergies  Allergen Reactions   Benadryl  [Diphenhydramine ] Other (See Comments)    flips out with it. Eyes roll to the back of her head.   Fish Protein-Containing Drug Products Anaphylaxis   Shellfish Allergy Shortness Of Breath    In dye for scan.   Aspirin  Nausea Only and Other (See Comments)    Pt stated it only gives her problems when she takes the high dose (325 mg) GI Intolerance,   Ivp Dye [Iodinated Contrast Media] Other (See Comments)    Hx of ? anaphlaxis   Latex Other (See Comments)    Pt says she gets a stinky infection     Home Medications  Prior to Admission medications   Medication Sig Start Date End Date Taking? Authorizing Provider  albuterol  (PROVENTIL ) (5 MG/ML) 0.5% nebulizer solution Take 0.5 mLs (2.5 mg total) by  nebulization every 6 (six) hours as needed for wheezing or shortness of breath. 11/25/22  Yes Piontek, Rocky, MD  albuterol  (VENTOLIN  HFA) 108 (90 Base) MCG/ACT inhaler Inhale 2 puffs into the lungs every 4 (four) hours as needed for wheezing or shortness of breath. 06/28/19  Yes [provider]  aspirin  81 MG tablet Take 81 mg by mouth daily.   Yes [provider]  atorvastatin  (LIPITOR ) 80 MG tablet TAKE 1 TABLET BY MOUTH AT BEDTIME 04/14/23  Yes [place], Peter M, MD  DULoxetine  (CYMBALTA ) 30 MG capsule Take  30 mg by mouth daily. 08/10/19  Yes [provider]  ergocalciferol  (VITAMIN D2) 50000 UNITS capsule Take 50,000 Units by mouth once a week.   Yes [provider]  estradiol (ESTRACE) 0.1 MG/GM vaginal cream Place 1 Applicatorful vaginally as needed. 12/27/19  Yes [provider]  fluticasone  (FLONASE ) 50 MCG/ACT nasal spray Place 1 spray into both nostrils daily as needed for allergies or rhinitis.  05/28/19  Yes [provider]  Fluticasone -Salmeterol (ADVAIR) 250-50 MCG/DOSE AEPB Inhale 1 puff into the lungs 2 (two) times daily.   Yes [provider]  Fluticasone -Umeclidin-Vilant (TRELEGY ELLIPTA) 200-62.5-25 MCG/ACT AEPB Inhale 1 puff into the lungs as needed.   Yes [provider]  furosemide  (LASIX ) 20 MG tablet Take 1 tablet (20 mg total) by mouth daily. 09/11/23 12/19/24 Yes [place], Peter M, MD  ipratropium-albuterol  (DUONEB) 0.5-2.5 (3) MG/3ML SOLN Use 1 vial by nebulization 2 (two) times daily. Use twice a day scheduled 03/16/21  Yes Singh, Prashant K, MD  JANUMET  50-500 MG tablet Take 1 tablet by mouth daily. 07/27/20  Yes [provider]  lansoprazole  (PREVACID ) 30 MG capsule Take 1 capsule (30 mg total) by mouth 2 (two) times daily before a meal. 11/18/23  Yes May, Deanna J, NP  lidocaine  (LIDODERM ) 5 % 1 patch daily. 04/08/22  Yes [provider]  loratadine  (CLARITIN ) 10 MG tablet Take 1 tablet (10 mg total) by mouth daily. 06/28/19  Yes Ghimire, Donalda HERO, MD  LORazepam  (ATIVAN ) 1 MG tablet Take 1 tablet (1 mg total) by mouth 2 (two) times daily. 12/18/19  Yes Odell Celinda Balo, MD  meclizine  (ANTIVERT ) 25 MG tablet Take 25 mg by mouth daily as needed for dizziness.   Yes [provider]  metoprolol  succinate (TOPROL -XL) 25 MG 24 hr tablet Take 1 tablet (25 mg total) by mouth daily. 07/11/23  Yes [place], Peter M, MD  montelukast (SINGULAIR) 10 MG tablet Take 10 mg by mouth at bedtime. 09/18/23  Yes [provider]  oxyCODONE -acetaminophen  (PERCOCET) 10-325 MG tablet Take 1 tablet by mouth 2 (two) times daily as needed for pain. 12/18/19  Yes Odell Celinda Balo, MD  pantoprazole  (PROTONIX ) 40 MG tablet Take 40 mg by mouth 2 (two) times daily. 11/10/20  Yes [provider]  promethazine  (PHENERGAN ) 25 MG tablet Take 25 mg by mouth as needed. 06/19/23  Yes [provider]  sacubitril -valsartan  (ENTRESTO ) 24-26 MG Take 1 tablet by mouth 2 (two) times daily. 07/11/23  Yes [place], Peter M, MD  spironolactone  (ALDACTONE ) 25 MG tablet Take 1 tablet (25 mg total) by mouth daily. 09/11/23  Yes [place], Peter M, MD  SYMBICORT 160-4.5 MCG/ACT inhaler Inhale 2 puffs into the lungs daily.   Yes [provider]  traZODone  (DESYREL ) 50 MG tablet Take 75 mg by mouth at bedtime. 12/15/20  Yes [provider]  AISHA PASTOR LANCETS 33G MISC  06/03/16   [provider]  ONETOUCH VERIO test strip  06/03/16   [provider]  Potassium Chloride  ER 20 MEQ TBCR Take 1 tablet by mouth daily. 12/29/19   [provider]  predniSONE  (DELTASONE ) 20 MG tablet Take 2 tablets (40 mg total) by mouth daily. 09/30/23   Prosperi, Christian H, PA-C  PROAIR  HFA 108 (90 Base) MCG/ACT inhaler Inhale 2 puffs into the lungs every 4 (four) hours as needed for wheezing or shortness of breath. Uses as rescue inhaler Reported on 06/20/2015 06/28/19   Raenelle Donalda HERO, MD  sacubitril -valsartan  (ENTRESTO ) 97-103 MG Take 1 tablet by mouth 2 (two) times daily. 04/14/23   [place], Peter M, MD     Critical care time: 65 mins     [facility]   [facility] 01/20/2024, 3:36 AM  Please see [email or web address] for pager details.  From 7A-7P if no response, please call [phone]. After hours, please call ELink [phone].

## 2024-01-10 NOTE — Progress Notes (Signed)
 Impella alarming, only able to run at P2 due to suction alarms. MAP now in 40s. HR in 50s.  Plan: Con't pressors-- already running at supraphysiologic doses. Decrease amiodarone to 30; can d/c if HR <50. Waiting on one final family member to arrive. Multiple family members at bedside.  AHF updated.  Leita SHAUNNA Gaskins, DO 01/01/2024 4:24 PM [facility]  For contact information, see Amion. If no response to pager, please call PCCM consult pager. After hours, 7PM- 7AM, please call Elink.

## 2024-01-10 NOTE — Progress Notes (Signed)
 Orthopedic Tech Progress Note Patient Details:  Nicole [name] 02-Oct-1945 [id number]  Ortho Devices Type of Ortho Device: Knee Immobilizer Ortho Device/Splint Location: lle Ortho Device/Splint Interventions: Ordered, Adjustment, Application   Post Interventions Patient Tolerated: Well Instructions Provided: Care of device, Adjustment of device  Chandra Dorn PARAS Jan 15, 2024, 4:29 AM

## 2024-01-10 NOTE — ED Notes (Signed)
 Patient has pulse, transported with cath lab RN, primary RN, secondary RN, and RT.

## 2024-01-10 NOTE — Progress Notes (Signed)
 eLink Physician-Brief Progress Note Patient Name: Nicole [name] DOB: 1945/10/26 MRN: [id number]   Date of Service  01/12/24  HPI/Events of Note  78 yr old female with significant pmhx of CAD ( DES to OMI 2021) last ECHO , Mild Intermittent Asthma, HTN, HLD, DM2, MDD, and GERD who presents with 2 day hx of substernal chest pain and shortness of breath who developed V-fib arrest thought to be secondary to Takotsubo's complicated by acute respiratory failure, acute kidney injury.  On examination, patient is tachypneic, tachycardic hypotensive and hypoxic.  Currently on norepinephrine, epinephrine , and vasopressin infusions.  Results consistent with severe anion gap metabolic acidosis with electrolyte disturbances, transaminitis, troponin leak, leukocytosis, and normocytic anemia.  Cath reveals mild pulmonary hypertension but nonobstructive CAD.  eICU Interventions  Vasopressor support as needed to maintain MAP greater than 65.  Maintain bicarbonate infusion  Lines pending.    Continue mechanical ventilation, daily spontaneous awakening/breathing trials  Amiodarone infusion in place  DVT prophylaxis with heparin  GI prophylaxis with pantoprazole  60     Intervention Category Evaluation Type: New Patient Evaluation  Darsha Zumstein 01/12/24, 4:31 AM

## 2024-01-10 NOTE — Sedation Documentation (Addendum)
 Intubation complete. 23 at the lip

## 2024-01-10 NOTE — Progress Notes (Signed)
 Over the course of the day the patient's pressor requirements have worsened.  Her pressors have been increased to norepinephrine at 45, epinephrine  at 30, vaso at 0.04, and phenylephrine at 50.  Overall has remained dependent on the Impella CP with minimal pulse pressure.  Has had occasional improvements in pulsatility with pushes of calcium  but these have been transient.  Initial improvement in pH to 7.2, however, became progressively more hypotensive with hemoglobin no longer picking up on the i-STAT gases.  Repeat CBC dilute, but showed hemoglobin of 3.0, remainder of DIC panel consistent with DIC. Has been given multiple units of blood and product. Lactic acid remains greater than 15.  Discussed with daughter and cousin over the phone. Daughter is primary Management consultant. We discussed that despite all efforts, her pH has been worsening, she has multisystem organ failure, and she will not be able to survive this. We recommended transition of code status to DNR/DNI, which daughter was amenable too. Will continue current scope of care until family arrives. Then would plan palliative extubation.  Additional CCT: 45 minutes

## 2024-01-10 NOTE — Progress Notes (Signed)
   2024-01-08 0300  Spiritual Encounters  Type of Visit Initial;Attempt (pt unavailable)  Reason for visit Code  OnCall Visit Yes   Chaplain was paged to Code STEMI. Chaplain attempted twice, but the patient was unavailable. Chaplain remains available if needed.   M.Kubra Susanna Kerry Resident [phone]

## 2024-01-10 NOTE — IPAL (Signed)
  Interdisciplinary Goals of Care Family Meeting   Date carried out:: 16-Jan-2024  Location of the meeting: Bedside  Member's involved: Physician, Bedside Registered Nurse, and Family Member or next of kin  Durable Power of Attorney or acting medical decision maker: daughter    Discussion: We discussed goals of care for Nicole [name] .  Ms. Mcginness's additional family members have made it to bedside and are spending time with her. In a few minutes her daughter and the family want to terminally extubate and fully focus on comfort. RT notified we will be extubating in a few minutes when family is ready.  Code status: Full DNR  Disposition: In-patient comfort care   Time spent for the meeting: 10 min.  Leita SHAUNNA Gaskins 01-16-2024, 5:22 PM

## 2024-01-10 NOTE — Plan of Care (Signed)
  Problem: Education: Goal: Understanding of cardiac disease, CV risk reduction, and recovery process will improve Outcome: Not Progressing Goal: Individualized Educational Video(s) Outcome: Not Progressing   Problem: Clinical Measurements: Goal: Ability to maintain clinical measurements within normal limits will improve Outcome: Not Progressing Goal: Will remain free from infection Outcome: Not Progressing Goal: Diagnostic test results will improve Outcome: Not Progressing Goal: Cardiovascular complication will be avoided Outcome: Not Progressing   Problem: Activity: Goal: Risk for activity intolerance will decrease Outcome: Not Progressing   Problem: Skin Integrity: Goal: Risk for impaired skin integrity will decrease Outcome: Not Progressing   Problem: Cardiovascular: Goal: Ability to achieve and maintain adequate cardiovascular perfusion will improve Outcome: Not Progressing

## 2024-01-10 NOTE — ED Notes (Signed)
 CPR initiated

## 2024-01-10 NOTE — Consult Note (Addendum)
 Advanced Heart Failure Team Consult Note   Primary Physician: Nichole Senior, MD Cardiologist:  Peter Swaziland, MD  Reason for Consultation: Cardiogenic shock  HPI:    Nicole [name] is seen today for evaluation of cardiogenic at the request of Dr. Swaziland.  Briefly, patient has a prior history of HFrEF that improved with medical therapy. Unable to provide history so history obtained from daughter and chart review.   Patient presented with 2 days of substernal CP and shortness of breath, improved with nitroglycerin . BP dropped precipitously in the ER and she required BiPAP for resusctitation.  EKG showed inferior and anterolateral ST elevations and patient was taken emergently to the cath lab. Intubated en route, subsequently developed VF cardiac arrest requiring CPR. Per code documentation, she had 14 minutes of arrest in the ED and 10 minutes in the cath lab.  In the lab, upfront Impella CP was placed. R iliac was occluded so left femoral was used, groin shot at the end of the case showed good flow. Coronary angiogram without significant obstructive disease. Patient was transferred to the CCU in critical condition, multiple drips with an elevated lactic acid of >15    Objective:    Vital Signs:   Temp:  [98 F (36.7 C)] 98 F (36.7 C) (10/12 0044) Pulse Rate:  [0-160] 117 (10/12 0259) Resp:  [17-74] 23 (10/12 0259) BP: (46-170)/(23-100) 107/88 (10/12 0249) SpO2:  [0 %-100 %] 95 % (10/12 0259) FiO2 (%):  [40 %-100 %] 100 % Jan 13, 2024 0059) Weight:  [72.6 kg] 72.6 kg (10/11 2341)    Weight change: Filed Weights   12/20/23 2341  Weight: 72.6 kg    Intake/Output:   Intake/Output Summary (Last 24 hours) at 01/13/2024 0414 Last data filed at 01/13/24 0156 Gross per 24 hour  Intake 15 ml  Output --  Net 15 ml      Physical Exam    GENERAL: Ill appearing PULM:  ventilated breath sounds, rhoncherous CARDIAC:  JVP: mildly elevated         Tachycardic, left femoral  impella, no significant murmurs ABDOMEN: Soft, non-tender, non-distended. NEUROLOGIC: Sedated   Telemetry   Sinus tachycardia 110, occasional PVCs  Labs   Basic Metabolic Panel: Recent Labs  Lab 12/20/23 2344 Jan 13, 2024 0152 January 13, 2024 0219  NA 133* 140 142  K 4.0 4.5 3.4*  CL 101  --   --   CO2 8*  --   --   GLUCOSE 389*  --   --   BUN 40*  --   --   CREATININE 3.72*  --   --   CALCIUM  7.7*  --   --     Liver Function Tests: Recent Labs  Lab 12/20/23 2344  AST 70*  ALT <5  ALKPHOS 85  BILITOT 0.9  PROT 6.1*  ALBUMIN  3.1*   No results for input(s): LIPASE, AMYLASE in the last 168 hours. No results for input(s): AMMONIA in the last 168 hours.  CBC: Recent Labs  Lab 12/20/23 2344 2024/01/13 0152 January 13, 2024 0219 01-13-24 0336  WBC 19.2*  --   --  21.5*  NEUTROABS 15.6*  --   --   --   HGB 12.0 9.9* 8.5* 9.7*  HCT 38.7 29.0* 25.0* 29.8*  MCV 94.4  --   --  92.0  PLT 268  --   --  219    Cardiac Enzymes: No results for input(s): CKTOTAL, CKMB, CKMBINDEX, TROPONINI in the last 168 hours.  BNP: BNP (last 3  results) Recent Labs    12/21/22 1154  BNP 25.0     Medications:     Current Medications:  [START ON 12/22/2023] aspirin   81 mg Per Tube Daily   atorvastatin   80 mg Per Tube QHS   EPINEPHrine  NaCl       etomidate  20 mg Intravenous Once   insulin  aspart  0-15 Units Subcutaneous Q4H    morphine  injection  1 mg Intravenous Once   pantoprazole   40 mg Oral BID   phenylephrine       sodium chloride  flush  3 mL Intravenous Q12H   succinylcholine  100 mg Intravenous Once   traZODone   75 mg Per Tube QHS    Infusions:  sodium chloride  999 mL/hr at 12/20/23 2343   sodium chloride  250 mL (01/04/2024 0008)   sodium chloride      albumin  human     amiodarone     norepinephrine     norepinephrine (LEVOPHED) Adult infusion 30 mcg/min (01/04/24 0306)   piperacillin -tazobactam     piperacillin -tazobactam (ZOSYN )  IV     sodium bicarbonate   150 mEq in sterile water 1,150 mL infusion 100 mL/hr at 2024-01-04 0251   sodium bicarbonate  25 mEq (Impella PURGE) in dextrose  5 % 1000 mL bag     vasopressin 0.03 Units/min (08-01-2023 0346)      Patient Profile   Patient with a PMH of CAD, HFimpEF, asthma, HTN, diabetes who presented with cardiogenic shock complicated by in house cardiac arrest.  Assessment/Plan   Cardiogenic shock: SCAI Stage D with impella support and multiple pressors, currently at NE at 40, Epi at 20, vaso at 0.04. Bedside echocardiogram showed deep impella placement, retracted 2cm and left 3.8cm from the annulus. 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  - Continue Impella support, 3.6L/min at P8 - Given multisystem organ failure not an ECMO candidate, hopeful bridge to recovery with stress induced cardiomyopathy - Continue vasopressor support, consider methylene blue once arterial line in placed - IV antibiotics for potential aspiration - Extremely volume dependent  Acute renal failure: Due to profound shock. - Will eventually need diuresis, supportive care for now  Metabolic acidosis:  - In the setting of cardiogenic shock - Bicarb gtt, supportive care  Acute hypoxic respiratory failure: - CCM following  CAD:  - No significant disease on recent angiogram  Medication concerns reviewed with patient and pharmacy team. Barriers identified: Critical shock  Length of Stay: 0  [name], MD  01-04-2024, 4:14 AM  Unfortunately she is critically ill with multisystem organ failure. Lactic acid persistently greater than 15. Unlikely to survive the next 24 hours. If she continues to deteriorate will discuss code status further  CRITICAL CARE Performed by: [name]   Total critical care time: 120 minutes  Critical care time was exclusive of separately billable procedures and treating other patients.  Critical care was necessary to treat or prevent imminent  or life-threatening deterioration.  Critical care was time spent personally by me on the following activities: development of treatment plan with patient and/or surrogate as well as nursing, discussions with consultants, evaluation of patient's response to treatment, examination of patient, obtaining history from patient or surrogate, ordering and performing treatments and interventions, ordering and review of laboratory studies, ordering and review of radiographic studies, pulse oximetry and re-evaluation of patient's condition.     Advanced Heart Failure Team Pager [phone] (M-F; 7a - 5p)  Please contact [facility] for night-coverage after hours (4p -7a )  and weekends on [email or web address]

## 2024-01-10 NOTE — Progress Notes (Signed)
 Monitored patient while patient was on mechanical ventilator in the cath-lab.

## 2024-01-10 NOTE — Progress Notes (Signed)
 Patient terminally extubated per MD order with RN at bedside.

## 2024-01-10 NOTE — H&P (Addendum)
 Cardiology Admission History and Physical   Patient ID: Nicole [name] MRN: [id number]; DOB: 12-29-1945   Admission date: 12/20/2023  PCP:  Nichole Senior, MD   [facility] Providers Cardiologist:  Peter Swaziland, MD       Chief Complaint:  STEMI  Patient Profile: Nicole [name] is a 78 y.o. female with prior CAD and HF who is being seen 03-Jan-2024 for the evaluation of STEMI.  History of Present Illness: Nicole [name] presents with 2 days of substernal CP and SOB, currently 5/10 after 1 nitroglycerin . BP initially 90s systolic, then dropped to 70s.  HR 120s.  ECG initially with STE in V3-4, in ED ECG now showing inferior ST elevations as well.   Desatted, now on Bipap (CPAP in the field). Cool ext   Prior OM1 stent in 2021, last known EF was 60% although previously as low as 20%   Past Medical History:  Diagnosis Date   Anemia    Anxiety    Arthritis    Asthma    Back pain    CAD (coronary artery disease) 10/22/2019   DES to OM1 August 2021   Colon polyps    COVID-28 June 2019   Depression    Endometriosis    Fibromyalgia    Gallstones    GERD with esophagitis    Follows with Dr. Aneita with GI, EGD in the past   HTN (hypertension)    IBS (irritable bowel syndrome)    Major depressive disorder    Migraine    Mild intermittent asthma    Mixed diabetic hyperlipidemia associated with type 2 diabetes mellitus (HCC)    Pneumonia    Type 2 diabetes mellitus (HCC)    Urinary incontinence    Past Surgical History:  Procedure Laterality Date   ABDOMINAL HYSTERECTOMY     TAH BSO   APPENDECTOMY  03/11/1997   CATARACT EXTRACTION Bilateral    CHOLECYSTECTOMY     CORONARY STENT INTERVENTION N/A 10/21/2019   Procedure: CORONARY STENT INTERVENTION;  Surgeon: Burnard Debby LABOR, MD;  Location: [facility];  Service: Cardiovascular;  Laterality: N/A;   DENTAL SURGERY     HERNIA REPAIR  03/11/1998   LAPAROSCOPIC ENDOMETRIOSIS FULGURATION     RIGHT/LEFT  HEART CATH AND CORONARY ANGIOGRAPHY N/A 10/21/2019   Procedure: RIGHT/LEFT HEART CATH AND CORONARY ANGIOGRAPHY;  Surgeon: Burnard Debby LABOR, MD;  Location: [facility]VAS[facility] CV [facility];  Service: Cardiovascular;  Laterality: N/A;     Medications Prior to Admission: Prior to Admission medications   Medication Sig Start Date End Date Taking? Authorizing Provider  albuterol  (PROVENTIL ) (5 MG/ML) 0.5% nebulizer solution Take 0.5 mLs (2.5 mg total) by nebulization every 6 (six) hours as needed for wheezing or shortness of breath. 11/25/22  Yes Piontek, Rocky, MD  albuterol  (VENTOLIN  HFA) 108 (90 Base) MCG/ACT inhaler Inhale 2 puffs into the lungs every 4 (four) hours as needed for wheezing or shortness of breath. 06/28/19  Yes [provider]  aspirin  81 MG tablet Take 81 mg by mouth daily.   Yes [provider]  atorvastatin  (LIPITOR ) 80 MG tablet TAKE 1 TABLET BY MOUTH AT BEDTIME 04/14/23  Yes [place], Peter M, MD  DULoxetine  (CYMBALTA ) 30 MG capsule Take 30 mg by mouth daily. 08/10/19  Yes [provider]  ergocalciferol  (VITAMIN D2) 50000 UNITS capsule Take 50,000 Units by mouth once a week.   Yes [provider]  estradiol (ESTRACE) 0.1 MG/GM vaginal cream Place 1 Applicatorful  vaginally as needed. 12/27/19  Yes [provider]  fluticasone  (FLONASE ) 50 MCG/ACT nasal spray Place 1 spray into both nostrils daily as needed for allergies or rhinitis.  05/28/19  Yes [provider]  Fluticasone -Salmeterol (ADVAIR) 250-50 MCG/DOSE AEPB Inhale 1 puff into the lungs 2 (two) times daily.   Yes [provider]  Fluticasone -Umeclidin-Vilant (TRELEGY ELLIPTA) 200-62.5-25 MCG/ACT AEPB Inhale 1 puff into the lungs as needed.   Yes [provider]  furosemide  (LASIX ) 20 MG tablet Take 1 tablet (20 mg total) by mouth daily. 09/11/23 12/19/24 Yes [place], Peter M, MD  ipratropium-albuterol  (DUONEB) 0.5-2.5 (3) MG/3ML SOLN Use 1 vial by nebulization 2 (two) times  daily. Use twice a day scheduled 03/16/21  Yes Singh, Prashant K, MD  JANUMET  50-500 MG tablet Take 1 tablet by mouth daily. 07/27/20  Yes [provider]  lansoprazole  (PREVACID ) 30 MG capsule Take 1 capsule (30 mg total) by mouth 2 (two) times daily before a meal. 11/18/23  Yes May, Deanna J, NP  lidocaine  (LIDODERM ) 5 % 1 patch daily. 04/08/22  Yes [provider]  loratadine  (CLARITIN ) 10 MG tablet Take 1 tablet (10 mg total) by mouth daily. 06/28/19  Yes Ghimire, Donalda HERO, MD  LORazepam  (ATIVAN ) 1 MG tablet Take 1 tablet (1 mg total) by mouth 2 (two) times daily. 12/18/19  Yes Odell Celinda Balo, MD  meclizine  (ANTIVERT ) 25 MG tablet Take 25 mg by mouth daily as needed for dizziness.   Yes [provider]  metoprolol  succinate (TOPROL -XL) 25 MG 24 hr tablet Take 1 tablet (25 mg total) by mouth daily. 07/11/23  Yes [place], Peter M, MD  montelukast (SINGULAIR) 10 MG tablet Take 10 mg by mouth at bedtime. 09/18/23  Yes [provider]  oxyCODONE -acetaminophen  (PERCOCET) 10-325 MG tablet Take 1 tablet by mouth 2 (two) times daily as needed for pain. 12/18/19  Yes Odell Celinda Balo, MD  pantoprazole  (PROTONIX ) 40 MG tablet Take 40 mg by mouth 2 (two) times daily. 11/10/20  Yes [provider]  promethazine  (PHENERGAN ) 25 MG tablet Take 25 mg by mouth as needed. 06/19/23  Yes [provider]  sacubitril -valsartan  (ENTRESTO ) 24-26 MG Take 1 tablet by mouth 2 (two) times daily. 07/11/23  Yes [place], Peter M, MD  spironolactone  (ALDACTONE ) 25 MG tablet Take 1 tablet (25 mg total) by mouth daily. 09/11/23  Yes [place], Peter M, MD  SYMBICORT 160-4.5 MCG/ACT inhaler Inhale 2 puffs into the lungs daily.   Yes [provider]  traZODone  (DESYREL ) 50 MG tablet Take 75 mg by mouth at bedtime. 12/15/20  Yes [provider]  AISHA PASTOR LANCETS 33G MISC  06/03/16   [provider]  [facility] VERIO test strip  06/03/16   [provider]   Potassium Chloride  ER 20 MEQ TBCR Take 1 tablet by mouth daily. 12/29/19   [provider]  predniSONE  (DELTASONE ) 20 MG tablet Take 2 tablets (40 mg total) by mouth daily. 09/30/23   Prosperi, Christian H, PA-C  PROAIR  HFA 108 (90 Base) MCG/ACT inhaler Inhale 2 puffs into the lungs every 4 (four) hours as needed for wheezing or shortness of breath. Uses as rescue inhaler Reported on 06/20/2015 06/28/19   Raenelle Donalda HERO, MD  sacubitril -valsartan  (ENTRESTO ) 97-103 MG Take 1 tablet by mouth 2 (two) times daily. 04/14/23   [place], Peter M, MD     Allergies:    Allergies  Allergen Reactions   Benadryl  [Diphenhydramine ] Other (See Comments)    flips out  with it. Eyes roll to the back of her head.   Fish Protein-Containing Drug Products Anaphylaxis   Shellfish Allergy Shortness Of Breath    In dye for scan.   Aspirin  Nausea Only and Other (See Comments)    Pt stated it only gives her problems when she takes the high dose (325 mg) GI Intolerance,   Ivp Dye [Iodinated Contrast Media] Other (See Comments)    Hx of ? anaphlaxis   Latex Other (See Comments)    Pt says she gets a stinky infection    Social History:   Social History   Socioeconomic History   Marital status: Widowed    Spouse name: Not on file   Number of children: 1   Years of education: Not on file   Highest education level: Not on file  Occupational History   Occupation: disability  Tobacco Use   Smoking status: Never    Passive exposure: Past   Smokeless tobacco: Never  Vaping Use   Vaping status: Never Used  Substance and Sexual Activity   Alcohol use: Not Currently    Comment: occasional   Drug use: No   Sexual activity: Not Currently    Comment: 1st intercourse 78 yo-Fewer than 5 partners  Other Topics Concern   Not on file  Social History Narrative   Right handed    Social Drivers of Health   Financial Resource Strain: Not on file  Food Insecurity: Low Risk  (02/05/2023)   Received from  [facility]   Hunger Vital Sign    Within the past 12 months, you worried that your food would run out before you got money to buy more: Never true    Within the past 12 months, the food you bought just didn't last and you didn't have money to get more. : Never true  Transportation Needs: No Transportation Needs (02/05/2023)   Received from [facility]    In the past 12 months, has lack of reliable transportation kept you from medical appointments, meetings, work or from getting things needed for daily living? : No  Physical Activity: Not on file  Stress: Not on file  Social Connections: Not on file  Intimate Partner Violence: Not on file     Family History:   The patient's family history includes Bone cancer in her paternal aunt; Cancer in her brother; Diabetes in her daughter, father, and mother; Heart attack in her daughter and father; Heart failure in her father and mother; Hypertension in her father, maternal grandfather, maternal grandmother, mother, paternal grandfather, and paternal grandmother; Lung cancer in her brother and paternal uncle; Other in her brother and brother; Stroke in her father. There is no history of Colon cancer or Stomach cancer.    ROS:  Please see the history of present illness.  All other ROS reviewed and negative.     Physical Exam/Data: Vitals:   12/20/23 2342 12/20/23 2343 12/20/23 2344 12/20/23 2344  BP:    (!) 88/48  Pulse: (!) 123 (!) 122 (!) 122   Resp:      Temp:      SpO2: 100% 100% 100%   Weight:      Height:       No intake or output data in the 24 hours ending 19-Jan-2024 0018    12/20/2023   11:41 PM 11/18/2023    3:16 PM 10/27/2023    2:18 PM  Last 3 Weights  Weight (lbs) 160 lb 162 lb 8  oz 163 lb 6.4 oz  Weight (kg) 72.576 kg 73.71 kg 74.118 kg     Body mass index is 28.34 kg/m.  General:  Well nourished, well developed, in moderate acute distress HEENT: normal, wearing Bipap Neck: some JVD Vascular: Distal  pulses 1+ bilaterally   Cardiac:  normal S1, S2; RRR; no murmur tachy Lungs:  rhonchi bilaterally Abd: soft, nontender Ext: B 1+ edema  Skin: cool  Psych:  Normal affect   Laboratory Data: High Sensitivity Troponin:  No results for input(s): TROPONINIHS in the last 720 hours.    ChemistryNo results for input(s): NA, K, CL, CO2, GLUCOSE, BUN, CREATININE, CALCIUM , MG, GFRNONAA, GFRAA, ANIONGAP in the last 168 hours.  No results for input(s): PROT, ALBUMIN , AST, ALT, ALKPHOS, BILITOT in the last 168 hours. Lipids No results for input(s): CHOL, TRIG, HDL, LABVLDL, LDLCALC, CHOLHDL in the last 168 hours. Hematology Recent Labs  Lab 12/20/23 2344  WBC 19.2*  RBC 4.10  HGB 12.0  HCT 38.7  MCV 94.4  MCH 29.3  MCHC 31.0  RDW 13.2  PLT 268   Thyroid No results for input(s): TSH, FREET4 in the last 168 hours. BNPNo results for input(s): BNP, PROBNP in the last 168 hours.  DDimer No results for input(s): DDIMER in the last 168 hours.  Radiology/Studies:  DG Chest Port 1 View Result Date: 01/18/2024 CLINICAL DATA:  Post intubation.  Shortness of breath, chest pain EXAM: PORTABLE CHEST 1 VIEW COMPARISON:  09/30/2023 FINDINGS: Endotracheal tube not visualized. Heart and mediastinal contours within normal limits. No confluent airspace opacities or effusions. No acute bony abnormality. IMPRESSION: No active cardiopulmonary disease. Endotracheal tube not visualized. Electronically Signed   By: Franky Crease M.D.   On: 2024/01/18 00:10     Assessment and Plan: Inferior and anterior ST elevations. Evidence of cardiogenic shock with hypoperfusion and elevated lactate  - Bolused 1L fluids - Got ASA and heparin  bolus - Started on norepinephrine for BP support - currently able to lie down for cath - activating cath lab  Risk Assessment/Risk Scores:   TIMI Risk Score for ST  Elevation MI:   The patient's TIMI risk score is  , which  indicates a  % risk of all cause mortality at 30 days.   [place]  Heart Association (NYHA) Functional Class NYHA Class IV   Code Status: Full Code  Severity of Illness: The appropriate patient status for this patient is INPATIENT. Inpatient status is judged to be reasonable and necessary in order to provide the required intensity of service to ensure the patient's safety. The patient's presenting symptoms, physical exam findings, and initial radiographic and laboratory data in the context of their chronic comorbidities is felt to place them at high risk for further clinical deterioration. Furthermore, it is not anticipated that the patient will be medically stable for discharge from the hospital within 2 midnights of admission.   * I certify that at the point of admission it is my clinical judgment that the patient will require inpatient hospital care spanning beyond 2 midnights from the point of admission due to high intensity of service, high risk for further deterioration and high frequency of surveillance required.*  For questions or updates, please contact [facility] Please consult [email or web address] for contact info under       Signed, Andee Flatten, MD  01-18-2024 12:18 AM    1:17am Addendum:  Patient developed progressive respiratory distress despite Bipap support, requiring emergency intubation.  Had a PEA arrest with heart  rate dipping from the 170s down to bradycardic range after intubation.  Pulse recovered with CPR/epi.  Norepineprhine maxed out.  Transported to the cath lab for urgent revasc.  [facility] on call

## 2024-01-10 NOTE — ED Notes (Signed)
 EDP Long, MD determined emergent intubation, no consent needed.

## 2024-01-10 NOTE — Death Summary Note (Signed)
 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 year old female with significant pmhx of CAD ( DES to [facility] 2021) last ECHO , Mild Intermittent Asthma, HTN, HLD, DM2, MDD, and GERD who presents with 2 day hx of substernal chest pain and shortness of breath. Patient was placed on CPAP in field by EMS and changed over to BIPAP. Cardiology consulted, Code STEMI called. Patient received 1 of nitroglycerin , SBP originally 90s but after decreased to 70s. Initial ST elevation noted in leads V3- V4, but also showed inferior ST elevations as well. Patient was noted to be in cardiogenic shock, significant hypoperfusion and lactic acidosis (8.8). Patient unable to lie down flat for cath, intubated ,unfortunately vfib cardiac arrest, emergently taken to cath lab. Patient had multiple rounds of CPR during cath lab ( ~15 mins total time). Impella placed during case. MD Ladona consulted PCCM to assist with ICU care and further  vent management. Per report, patient noted to have multivessel CAD but no coronary culprit identified, suspecting Takotsubo cardiomyopathy.  Despite Impella, high dose inotropic support, methylene blue, stress dose steroids, bicarb, she continued to decline. Family was able to come in, and she was transitioned to comfort-focused care. She expired with family at bedside.    Pertinent Labs and Studies  Significant Diagnostic Studies CARDIAC CATHETERIZATION Result Date: 12/22/23   Prox LAD lesion is 20% stenosed.   Mid LAD lesion is 40% stenosed.   Prox RCA to Mid RCA lesion is 20% stenosed.   Prox RCA lesion is 30% stenosed.   Mid RCA to Dist RCA lesion is 50% stenosed.   Prox Cx lesion is 30% stenosed.   Non-stenotic 1st Mrg lesion was previously treated.   LV end diastolic pressure is moderately elevated.   Hemodynamic findings consistent with mild pulmonary hypertension. Nonobstructive CAD Moderately elevated filling pressures. LVEDP 25 mm Hg with Impella.  PCWP 25/27, mean 21 mm Hg Mild pulmonary HTN PAP 42/24, mean 34 mm Hg Normal RA pressure mean 10 mm Hg Cardiac output (with Impella) 5.33 L/min, index 3.03 Plan: transfer to ICU for ongoing critical care and advanced heart failure therapy. Continue Impella support an IV pressors.   DG Chest Port 1 View Result Date: 2023-12-22 CLINICAL DATA:  Post intubation.  Shortness of breath, chest pain EXAM: PORTABLE CHEST 1 VIEW COMPARISON:  09/30/2023 FINDINGS: Endotracheal tube not visualized. Heart and mediastinal contours within normal limits. No confluent airspace opacities or effusions. No acute bony abnormality. IMPRESSION: No active cardiopulmonary disease. Endotracheal tube not visualized. Electronically Signed   By: Franky Crease M.D.   On: 12/22/2023 00:10  Microbiology Recent Results (from the past 240 hours)  MRSA Next Gen by PCR, Nasal     Status: None   Collection Time: 12/31/23  3:39 AM   Specimen: Nasal Mucosa; Nasal Swab  Result  Value Ref Range Status   MRSA by PCR Next Gen NOT DETECTED NOT DETECTED Final    Comment: (NOTE) The GeneXpert MRSA Assay (FDA approved for NASAL specimens only), is one component of a comprehensive MRSA colonization surveillance program. It is not intended to diagnose MRSA infection nor to guide or monitor treatment for MRSA infections. Test performance is not FDA approved in patients less than 63 years old. Performed at [facility], [address] [place], [place] [address]     Lab Basic Metabolic Panel: Recent Labs  Lab 12/20/23 2344 12-31-2023 0152 December 31, 2023 0219 2023/12/31 0333 12-31-23 0336 12/31/2023 0521  NA 133* 140 142 149* 149* 143  K 4.0 4.5 3.4* 3.4* 2.9* 3.1*  CL 101  --   --  98  --   --   CO2 8*  --   --  19*  --   --   GLUCOSE 389*  --   --  321*  --   --   BUN 40*  --   --  39*  --   --   CREATININE 3.72*  --   --  4.11*  --   --   CALCIUM  7.7*  --   --  9.8  --   --   MG  --   --   --  1.5*  --   --   PHOS  --   --   --  11.3*  --   --    Liver Function Tests: Recent Labs  Lab 12/20/23 2344 12/31/23 0333  AST 70* 281*  ALT <5 205*  ALKPHOS 85 72  BILITOT 0.9 0.9  PROT 6.1* 4.1*  ALBUMIN  3.1* 2.0*   No results for input(s): LIPASE, AMYLASE in the last 168 hours. No results for input(s): AMMONIA in the last 168 hours. CBC: Recent Labs  Lab 12/20/23 2344 12/31/23 0152 December 31, 2023 0219 2023-12-31 0336 12-31-2023 0521 12-31-23 0914  WBC 19.2*  --   --  21.5*  --  13.8*  NEUTROABS 15.6*  --   --   --   --   --   HGB 12.0 9.9* 8.5* 8.2*  9.7* 6.8* 3.0*  HCT 38.7 29.0* 25.0* 24.0*  29.8* 20.0* 10.2*  MCV 94.4  --   --  92.0  --  102.0*  PLT 268  --   --  219  --  Normal platelet morphology  112*   Cardiac Enzymes: No results for input(s): CKTOTAL, CKMB, CKMBINDEX, TROPONINI in the last 168 hours. Sepsis Labs: Recent Labs  Lab 12/20/23 2344 12/20/23 2346 31-Dec-2023 0153 12/31/2023 0336 12-31-2023 0522 2023-12-31 0639  2023-12-31 0914  WBC 19.2*  --   --  21.5*  --   --  13.8*  LATICACIDVEN  --    < > >15.0* >15.0* >15.0* >15.0*  --    < > = values in this interval not displayed.    Procedures/Operations  Left heart catheterization Impella placement Intubation A-line   Leita SHAUNNA Gaskins 2023/12/31, 6:14 PM

## 2024-01-10 DEATH — deceased

## 2024-01-19 ENCOUNTER — Ambulatory Visit (HOSPITAL_COMMUNITY): Admit: 2024-01-19 | Admitting: Gastroenterology

## 2024-01-19 SURGERY — COLONOSCOPY
Anesthesia: Monitor Anesthesia Care
